# Patient Record
Sex: Female | Born: 1945 | Race: White | Hispanic: No | Marital: Married | Smoking: Never smoker
Health system: Southern US, Community
[De-identification: ages and names within clinical notes are randomized; demographics above are authoritative.]

## PROBLEM LIST (undated history)

## (undated) DIAGNOSIS — G629 Polyneuropathy, unspecified: Secondary | ICD-10-CM

## (undated) DIAGNOSIS — I10 Essential (primary) hypertension: Secondary | ICD-10-CM

## (undated) DIAGNOSIS — G5600 Carpal tunnel syndrome, unspecified upper limb: Secondary | ICD-10-CM

## (undated) DIAGNOSIS — G709 Myoneural disorder, unspecified: Secondary | ICD-10-CM

## (undated) DIAGNOSIS — Z8679 Personal history of other diseases of the circulatory system: Secondary | ICD-10-CM

## (undated) DIAGNOSIS — D509 Iron deficiency anemia, unspecified: Secondary | ICD-10-CM

## (undated) DIAGNOSIS — Z8719 Personal history of other diseases of the digestive system: Secondary | ICD-10-CM

## (undated) DIAGNOSIS — E119 Type 2 diabetes mellitus without complications: Secondary | ICD-10-CM

## (undated) DIAGNOSIS — I2699 Other pulmonary embolism without acute cor pulmonale: Secondary | ICD-10-CM

## (undated) DIAGNOSIS — F32A Depression, unspecified: Secondary | ICD-10-CM

## (undated) DIAGNOSIS — R011 Cardiac murmur, unspecified: Secondary | ICD-10-CM

## (undated) DIAGNOSIS — G839 Paralytic syndrome, unspecified: Secondary | ICD-10-CM

## (undated) DIAGNOSIS — K219 Gastro-esophageal reflux disease without esophagitis: Secondary | ICD-10-CM

## (undated) DIAGNOSIS — R112 Nausea with vomiting, unspecified: Secondary | ICD-10-CM

## (undated) DIAGNOSIS — I499 Cardiac arrhythmia, unspecified: Secondary | ICD-10-CM

## (undated) DIAGNOSIS — K259 Gastric ulcer, unspecified as acute or chronic, without hemorrhage or perforation: Secondary | ICD-10-CM

## (undated) DIAGNOSIS — F419 Anxiety disorder, unspecified: Secondary | ICD-10-CM

## (undated) DIAGNOSIS — M1990 Unspecified osteoarthritis, unspecified site: Secondary | ICD-10-CM

## (undated) DIAGNOSIS — Z9889 Other specified postprocedural states: Secondary | ICD-10-CM

## (undated) DIAGNOSIS — Z87442 Personal history of urinary calculi: Secondary | ICD-10-CM

## (undated) DIAGNOSIS — R0600 Dyspnea, unspecified: Secondary | ICD-10-CM

## (undated) HISTORY — PX: UPPER GASTROINTESTINAL ENDOSCOPY: SHX188

## (undated) HISTORY — PX: APPENDECTOMY: SHX54

## (undated) HISTORY — PX: COLONOSCOPY: SHX174

## (undated) HISTORY — PX: OTHER SURGICAL HISTORY: SHX169

## (undated) HISTORY — DX: Polyneuropathy, unspecified: G62.9

## (undated) HISTORY — PX: TONSILLECTOMY: SUR1361

## (undated) HISTORY — DX: Other pulmonary embolism without acute cor pulmonale: I26.99

## (undated) HISTORY — PX: KNEE ARTHROSCOPY: SUR90

## (undated) HISTORY — DX: Essential (primary) hypertension: I10

## (undated) HISTORY — PX: EYE SURGERY: SHX253

## (undated) HISTORY — PX: CHOLECYSTECTOMY: SHX55

## (undated) HISTORY — PX: NECK SURGERY: SHX720

---

## 2001-08-02 ENCOUNTER — Inpatient Hospital Stay (HOSPITAL_COMMUNITY): Admission: EM | Admit: 2001-08-02 | Discharge: 2001-08-04 | Payer: Self-pay | Admitting: Emergency Medicine

## 2001-08-02 ENCOUNTER — Encounter (INDEPENDENT_AMBULATORY_CARE_PROVIDER_SITE_OTHER): Payer: Self-pay | Admitting: Specialist

## 2001-08-02 ENCOUNTER — Encounter: Payer: Self-pay | Admitting: Family Medicine

## 2001-09-19 ENCOUNTER — Encounter: Payer: Self-pay | Admitting: Family Medicine

## 2001-09-19 ENCOUNTER — Ambulatory Visit (HOSPITAL_COMMUNITY): Admission: RE | Admit: 2001-09-19 | Discharge: 2001-09-19 | Payer: Self-pay | Admitting: Family Medicine

## 2001-10-02 ENCOUNTER — Ambulatory Visit (HOSPITAL_COMMUNITY): Admission: RE | Admit: 2001-10-02 | Discharge: 2001-10-02 | Payer: Self-pay | Admitting: Family Medicine

## 2001-10-02 ENCOUNTER — Encounter: Payer: Self-pay | Admitting: Family Medicine

## 2002-02-13 ENCOUNTER — Ambulatory Visit (HOSPITAL_COMMUNITY): Admission: RE | Admit: 2002-02-13 | Discharge: 2002-02-13 | Payer: Self-pay | Admitting: Internal Medicine

## 2002-03-19 ENCOUNTER — Encounter: Payer: Self-pay | Admitting: Obstetrics and Gynecology

## 2002-03-19 ENCOUNTER — Ambulatory Visit (HOSPITAL_COMMUNITY): Admission: RE | Admit: 2002-03-19 | Discharge: 2002-03-19 | Payer: Self-pay | Admitting: Obstetrics and Gynecology

## 2002-05-10 ENCOUNTER — Ambulatory Visit (HOSPITAL_COMMUNITY): Admission: RE | Admit: 2002-05-10 | Discharge: 2002-05-10 | Payer: Self-pay | Admitting: Family Medicine

## 2002-05-10 ENCOUNTER — Encounter: Payer: Self-pay | Admitting: Family Medicine

## 2002-07-19 ENCOUNTER — Ambulatory Visit (HOSPITAL_COMMUNITY): Admission: RE | Admit: 2002-07-19 | Discharge: 2002-07-19 | Payer: Self-pay | Admitting: Family Medicine

## 2002-07-19 ENCOUNTER — Encounter: Payer: Self-pay | Admitting: Family Medicine

## 2002-07-30 ENCOUNTER — Ambulatory Visit (HOSPITAL_COMMUNITY): Admission: RE | Admit: 2002-07-30 | Discharge: 2002-07-30 | Payer: Self-pay | Admitting: Internal Medicine

## 2002-08-12 ENCOUNTER — Ambulatory Visit (HOSPITAL_COMMUNITY): Admission: RE | Admit: 2002-08-12 | Discharge: 2002-08-12 | Payer: Self-pay | Admitting: Family Medicine

## 2002-08-12 ENCOUNTER — Encounter: Payer: Self-pay | Admitting: Family Medicine

## 2002-11-25 ENCOUNTER — Ambulatory Visit (HOSPITAL_COMMUNITY): Admission: RE | Admit: 2002-11-25 | Discharge: 2002-11-25 | Payer: Self-pay | Admitting: Gastroenterology

## 2002-11-25 ENCOUNTER — Encounter: Payer: Self-pay | Admitting: Gastroenterology

## 2002-12-25 ENCOUNTER — Encounter: Admission: RE | Admit: 2002-12-25 | Discharge: 2002-12-25 | Payer: Self-pay | Admitting: Gastroenterology

## 2002-12-25 ENCOUNTER — Encounter: Payer: Self-pay | Admitting: Gastroenterology

## 2003-04-10 ENCOUNTER — Encounter: Payer: Self-pay | Admitting: Obstetrics and Gynecology

## 2003-04-10 ENCOUNTER — Ambulatory Visit (HOSPITAL_COMMUNITY): Admission: RE | Admit: 2003-04-10 | Discharge: 2003-04-10 | Payer: Self-pay | Admitting: Obstetrics and Gynecology

## 2003-05-23 ENCOUNTER — Ambulatory Visit (HOSPITAL_COMMUNITY): Admission: RE | Admit: 2003-05-23 | Discharge: 2003-05-23 | Payer: Self-pay | Admitting: Family Medicine

## 2003-05-23 ENCOUNTER — Encounter: Payer: Self-pay | Admitting: Family Medicine

## 2003-05-29 ENCOUNTER — Encounter: Payer: Self-pay | Admitting: Family Medicine

## 2003-05-29 ENCOUNTER — Ambulatory Visit (HOSPITAL_COMMUNITY): Admission: RE | Admit: 2003-05-29 | Discharge: 2003-05-29 | Payer: Self-pay | Admitting: Family Medicine

## 2003-10-24 ENCOUNTER — Ambulatory Visit (HOSPITAL_COMMUNITY): Admission: RE | Admit: 2003-10-24 | Discharge: 2003-10-24 | Payer: Self-pay | Admitting: Family Medicine

## 2003-11-07 ENCOUNTER — Ambulatory Visit (HOSPITAL_COMMUNITY): Admission: RE | Admit: 2003-11-07 | Discharge: 2003-11-07 | Payer: Self-pay | Admitting: Otolaryngology

## 2004-03-19 ENCOUNTER — Ambulatory Visit (HOSPITAL_COMMUNITY): Admission: RE | Admit: 2004-03-19 | Discharge: 2004-03-19 | Payer: Self-pay | Admitting: Family Medicine

## 2004-06-16 ENCOUNTER — Inpatient Hospital Stay (HOSPITAL_COMMUNITY): Admission: RE | Admit: 2004-06-16 | Discharge: 2004-06-17 | Payer: Self-pay | Admitting: Neurosurgery

## 2004-08-12 ENCOUNTER — Ambulatory Visit (HOSPITAL_COMMUNITY): Admission: RE | Admit: 2004-08-12 | Discharge: 2004-08-12 | Payer: Self-pay | Admitting: Family Medicine

## 2005-01-13 ENCOUNTER — Ambulatory Visit (HOSPITAL_COMMUNITY): Admission: RE | Admit: 2005-01-13 | Discharge: 2005-01-13 | Payer: Self-pay | Admitting: Family Medicine

## 2005-01-14 ENCOUNTER — Ambulatory Visit (HOSPITAL_COMMUNITY): Admission: RE | Admit: 2005-01-14 | Discharge: 2005-01-14 | Payer: Self-pay | Admitting: Family Medicine

## 2005-10-13 ENCOUNTER — Ambulatory Visit (HOSPITAL_COMMUNITY): Admission: RE | Admit: 2005-10-13 | Discharge: 2005-10-13 | Payer: Self-pay | Admitting: Family Medicine

## 2005-10-19 ENCOUNTER — Ambulatory Visit: Payer: Self-pay | Admitting: Internal Medicine

## 2005-10-25 ENCOUNTER — Ambulatory Visit: Payer: Self-pay | Admitting: Internal Medicine

## 2005-10-25 ENCOUNTER — Ambulatory Visit (HOSPITAL_COMMUNITY): Admission: RE | Admit: 2005-10-25 | Discharge: 2005-10-25 | Payer: Self-pay | Admitting: Internal Medicine

## 2005-12-28 ENCOUNTER — Ambulatory Visit: Payer: Self-pay | Admitting: Internal Medicine

## 2006-03-10 ENCOUNTER — Ambulatory Visit (HOSPITAL_COMMUNITY): Admission: RE | Admit: 2006-03-10 | Discharge: 2006-03-10 | Payer: Self-pay | Admitting: Family Medicine

## 2006-03-13 ENCOUNTER — Ambulatory Visit: Payer: Self-pay | Admitting: Internal Medicine

## 2006-07-21 ENCOUNTER — Ambulatory Visit (HOSPITAL_COMMUNITY): Admission: RE | Admit: 2006-07-21 | Discharge: 2006-07-21 | Payer: Self-pay | Admitting: Obstetrics and Gynecology

## 2007-06-01 ENCOUNTER — Ambulatory Visit: Payer: Self-pay | Admitting: Internal Medicine

## 2007-06-22 ENCOUNTER — Ambulatory Visit (HOSPITAL_COMMUNITY): Admission: RE | Admit: 2007-06-22 | Discharge: 2007-06-22 | Payer: Self-pay | Admitting: Obstetrics and Gynecology

## 2007-07-03 ENCOUNTER — Emergency Department (HOSPITAL_COMMUNITY): Admission: EM | Admit: 2007-07-03 | Discharge: 2007-07-03 | Payer: Self-pay | Admitting: Emergency Medicine

## 2007-08-02 ENCOUNTER — Observation Stay (HOSPITAL_COMMUNITY): Admission: RE | Admit: 2007-08-02 | Discharge: 2007-08-03 | Payer: Self-pay | Admitting: Obstetrics and Gynecology

## 2007-10-11 ENCOUNTER — Ambulatory Visit (HOSPITAL_COMMUNITY): Admission: RE | Admit: 2007-10-11 | Discharge: 2007-10-11 | Payer: Self-pay | Admitting: Obstetrics and Gynecology

## 2007-12-10 ENCOUNTER — Ambulatory Visit (HOSPITAL_COMMUNITY): Admission: RE | Admit: 2007-12-10 | Discharge: 2007-12-10 | Payer: Self-pay | Admitting: Family Medicine

## 2007-12-17 ENCOUNTER — Ambulatory Visit (HOSPITAL_COMMUNITY): Admission: RE | Admit: 2007-12-17 | Discharge: 2007-12-17 | Payer: Self-pay | Admitting: Family Medicine

## 2008-03-04 ENCOUNTER — Ambulatory Visit (HOSPITAL_COMMUNITY): Admission: RE | Admit: 2008-03-04 | Discharge: 2008-03-04 | Payer: Self-pay | Admitting: Family Medicine

## 2008-03-19 ENCOUNTER — Encounter: Admission: RE | Admit: 2008-03-19 | Discharge: 2008-03-19 | Payer: Self-pay | Admitting: Sports Medicine

## 2008-08-18 ENCOUNTER — Ambulatory Visit (HOSPITAL_COMMUNITY): Admission: RE | Admit: 2008-08-18 | Discharge: 2008-08-18 | Payer: Self-pay | Admitting: Family Medicine

## 2008-09-03 ENCOUNTER — Ambulatory Visit (HOSPITAL_COMMUNITY): Admission: RE | Admit: 2008-09-03 | Discharge: 2008-09-05 | Payer: Self-pay | Admitting: Neurosurgery

## 2008-09-08 ENCOUNTER — Ambulatory Visit (HOSPITAL_COMMUNITY): Admission: RE | Admit: 2008-09-08 | Discharge: 2008-09-08 | Payer: Self-pay | Admitting: Family Medicine

## 2008-09-09 ENCOUNTER — Ambulatory Visit (HOSPITAL_COMMUNITY): Admission: RE | Admit: 2008-09-09 | Discharge: 2008-09-09 | Payer: Self-pay | Admitting: Family Medicine

## 2008-09-24 ENCOUNTER — Emergency Department (HOSPITAL_COMMUNITY): Admission: EM | Admit: 2008-09-24 | Discharge: 2008-09-24 | Payer: Self-pay | Admitting: Emergency Medicine

## 2008-10-20 ENCOUNTER — Other Ambulatory Visit: Admission: RE | Admit: 2008-10-20 | Discharge: 2008-10-20 | Payer: Self-pay | Admitting: Obstetrics and Gynecology

## 2008-10-29 ENCOUNTER — Ambulatory Visit (HOSPITAL_COMMUNITY): Admission: RE | Admit: 2008-10-29 | Discharge: 2008-10-29 | Payer: Self-pay | Admitting: Family Medicine

## 2008-11-17 ENCOUNTER — Ambulatory Visit: Payer: Self-pay | Admitting: Cardiology

## 2009-01-21 ENCOUNTER — Ambulatory Visit (HOSPITAL_COMMUNITY): Admission: RE | Admit: 2009-01-21 | Discharge: 2009-01-21 | Payer: Self-pay | Admitting: Internal Medicine

## 2009-01-26 ENCOUNTER — Ambulatory Visit (HOSPITAL_COMMUNITY): Admission: RE | Admit: 2009-01-26 | Discharge: 2009-01-26 | Payer: Self-pay | Admitting: Family Medicine

## 2009-03-10 ENCOUNTER — Encounter (INDEPENDENT_AMBULATORY_CARE_PROVIDER_SITE_OTHER): Payer: Self-pay

## 2009-03-10 LAB — CONVERTED CEMR LAB
HCT: 41.1 %
MCV: 93 fL
Platelets: 276 10*3/uL
Potassium: 4.1 meq/L
Sodium: 142 meq/L

## 2009-03-13 ENCOUNTER — Ambulatory Visit (HOSPITAL_COMMUNITY): Admission: RE | Admit: 2009-03-13 | Discharge: 2009-03-13 | Payer: Self-pay | Admitting: Family Medicine

## 2009-05-22 ENCOUNTER — Ambulatory Visit (HOSPITAL_COMMUNITY): Admission: RE | Admit: 2009-05-22 | Discharge: 2009-05-22 | Payer: Self-pay | Admitting: Family Medicine

## 2009-11-17 ENCOUNTER — Ambulatory Visit (HOSPITAL_COMMUNITY): Admission: RE | Admit: 2009-11-17 | Discharge: 2009-11-17 | Payer: Self-pay | Admitting: Ophthalmology

## 2009-12-29 DIAGNOSIS — I10 Essential (primary) hypertension: Secondary | ICD-10-CM | POA: Insufficient documentation

## 2009-12-29 DIAGNOSIS — R011 Cardiac murmur, unspecified: Secondary | ICD-10-CM | POA: Insufficient documentation

## 2009-12-29 DIAGNOSIS — K449 Diaphragmatic hernia without obstruction or gangrene: Secondary | ICD-10-CM | POA: Insufficient documentation

## 2009-12-29 DIAGNOSIS — K219 Gastro-esophageal reflux disease without esophagitis: Secondary | ICD-10-CM

## 2009-12-29 DIAGNOSIS — J45909 Unspecified asthma, uncomplicated: Secondary | ICD-10-CM | POA: Insufficient documentation

## 2009-12-30 ENCOUNTER — Ambulatory Visit: Payer: Self-pay | Admitting: Cardiology

## 2009-12-30 ENCOUNTER — Encounter (INDEPENDENT_AMBULATORY_CARE_PROVIDER_SITE_OTHER): Payer: Self-pay

## 2010-03-22 ENCOUNTER — Ambulatory Visit (HOSPITAL_COMMUNITY): Admission: RE | Admit: 2010-03-22 | Discharge: 2010-03-22 | Payer: Self-pay | Admitting: Obstetrics and Gynecology

## 2010-08-20 ENCOUNTER — Emergency Department (HOSPITAL_COMMUNITY): Admission: EM | Admit: 2010-08-20 | Discharge: 2010-08-20 | Payer: Self-pay | Admitting: Emergency Medicine

## 2010-10-08 ENCOUNTER — Ambulatory Visit (HOSPITAL_COMMUNITY): Admission: RE | Admit: 2010-10-08 | Discharge: 2010-10-08 | Payer: Self-pay | Admitting: Family Medicine

## 2010-11-24 ENCOUNTER — Other Ambulatory Visit
Admission: RE | Admit: 2010-11-24 | Discharge: 2010-11-24 | Payer: Self-pay | Source: Home / Self Care | Admitting: Obstetrics and Gynecology

## 2010-12-21 ENCOUNTER — Encounter: Payer: Self-pay | Admitting: Cardiology

## 2010-12-21 ENCOUNTER — Ambulatory Visit: Payer: Self-pay | Admitting: Cardiology

## 2010-12-21 DIAGNOSIS — E663 Overweight: Secondary | ICD-10-CM | POA: Insufficient documentation

## 2010-12-23 ENCOUNTER — Ambulatory Visit (HOSPITAL_COMMUNITY)
Admission: RE | Admit: 2010-12-23 | Discharge: 2010-12-23 | Payer: Self-pay | Source: Home / Self Care | Attending: Cardiology | Admitting: Cardiology

## 2010-12-23 ENCOUNTER — Encounter: Payer: Self-pay | Admitting: Cardiology

## 2011-01-27 NOTE — Miscellaneous (Signed)
**Note De-Identified [facility] Obfuscation** Summary: CBC and  BMP  Clinical Lists Changes  Observations: Added new observation of CALCIUM: 9.2 mg/dL (14/78/2956 21:30) Added new observation of CREATININE: 1.03 mg/dL (86/57/8469 62:95) Added new observation of BUN: 16 mg/dL (28/41/3244 01:02) Added new observation of BG RANDOM: 178 mg/dL (72/53/6644 03:47) Added new observation of CO2 PLSM/SER: 24 meq/L (03/10/2009 10:11) Added new observation of CL SERUM: 106 meq/L (03/10/2009 10:11) Added new observation of K SERUM: 4.1 meq/L (03/10/2009 10:11) Added new observation of NA: 142 meq/L (03/10/2009 10:11) Added new observation of PLATELETK/UL: 276 K/uL (03/10/2009 10:11) Added new observation of MCV: 93.0 fL (03/10/2009 10:11) Added new observation of HCT: 41.1 % (03/10/2009 10:11) Added new observation of HGB: 13.6 g/dL (42/59/5638 75:64) Added new observation of WBC COUNT: 7.4 10*3/microliter (03/10/2009 10:11)

## 2011-01-27 NOTE — Assessment & Plan Note (Signed)
Summary: F1Y   Visit Type:  Follow-up Primary [name]:  Mindi Slicker at [facility]  CC:  no cardiology complaints 1 yr fu.  History of Present Illness: [name] returns today for followup of her hypertension, obesity, and family history of coronary disease.  She is having no angina or ischemic symptoms. She is fairly inactive because of problems with her left knee and right foot. Her weight is increased from last year from 192-199. Her BMI is now 35.3.  Her blood pressures done a good control. She is blessed with low-cholesterol and an HDL and high 50s.  She was told by her primary care that she had an irregular heartbeat. This was by exam and not by EKG. She denies the palpitations or chest discomfort. She's had no problems with presyncope or syncope.  Current Medications (verified): 1)  Valium 5 Mg Tabs (Diazepam) .... Take One Half Tablet Daily 2)  Tylenol 325 Mg Tabs (Acetaminophen) .... Take As Needed For Pain 3)  Hydrochlorothiazide 25 Mg Tabs (Hydrochlorothiazide) .... Take 1/2  Tab Daily 4)  Singulair 10 Mg Tabs (Montelukast Sodium) .... Take 1 Tab Daily 5)  Oyster Shell Calcium 500 + D 500-125 Mg-Unit Tabs (Calcium Carbonate-Vitamin D) .... Take 2 Tablets By Mouth Daily 6)  Buspar 5 Mg Tabs (Buspirone Hcl) .... Take 2 Tablets By Mouth Daily 7)  Prozac 20 Mg Caps (Fluoxetine Hcl) .... Take 1 Tablet By Mouth Once A Day 8)  Inderal La 80 Mg Xr24h-Cap (Propranolol Hcl) .... Take 1 Tablet By Mouth Once A Day 9)  Femhrt 1/5 1-5 Mg-Mcg Tabs (Norethindrone-Eth Estradiol) .... Take 1 Tablet By Mouth Once A Day 10)  Zegerid Otc 20-1100 Mg Caps (Omeprazole-Sodium Bicarbonate) .... Take 1 Tab Daily 11)  Gabapentin 300 Mg Caps (Gabapentin) .... Take 1 Tab Daily 12)  Bupropion Hcl 150 Mg Xr24h-Tab (Bupropion Hcl) .... Take 1 Tab Daily  Allergies (verified): 1)  ! * Pencillin  Comments:  Nurse/Medical Assistant: patient brought med list her [facility] is [facility] she also stopped  prilosec and started zegerid otc  Past History:  Past Medical History: Last updated: Jan 08, 2010 Current Problems:  HEART MURMUR, SYSTOLIC (ICD-785.2) ASTHMA, MILD (ICD-493.90) HYPERTENSION (ICD-401.9) HIATAL HERNIA (ICD-553.3) GERD (ICD-530.81)  Past Surgical History: Last updated: January 08, 2010 tonsillectomy cholecystectomy appendectomy knee surgery cervical surgery  Family History: Last updated: 01/08/2010 Father:deceased due to renal failure Mother:alive peptic ulcer disease  Social History: Last updated: 2010/01/08 Retired  Married  Tobacco Use - No.  Alcohol Use - no Regular Exercise - no Drug Use - no  Risk Factors: Exercise: no (January 08, 2010)  Risk Factors: Smoking Status: never (Jan 08, 2010)  Review of Systems       negative other than history of present illness  Vital Signs:  Patient profile:   65 year old female Weight:      199 pounds BMI:     35.38 O2 Sat:      98 % on Room air Pulse rate:   76 / minute BP sitting:   122 / 69  (right arm)  Vitals Entered By: Hailey Saa, CNA (December 21, 2010 10:41 AM)  O2 Flow:  Room air  Physical Exam  General:  obese.  no acute distress Head:  normocephalic and atraumatic Eyes:  PERRLA/EOM intact; conjunctiva and lids normal. Neck:  Neck supple, no JVD. No masses, thyromegaly or abnormal cervical nodes. Chest Wall:  no deformities or breast masses noted Lungs:  Clear bilaterally to auscultation and percussion. Heart:  PMI nondisplaced, regular rate  and rhythm, soft systolic murmur with S2 splitting. No carotid bruits Abdomen:  soft, good bowel sounds, no midline bruit Msk:  decreased ROM.   Pulses:  pulses normal in all 4 extremities Extremities:  No clubbing or cyanosis. Neurologic:  Alert and oriented x 3. Skin:  Intact without lesions or rashes. Psych:  Normal affect.   Problems:  Medical Problems Added: 1)  Dx of Overweight/obesity  (ICD-278.02)  EKG  Procedure date:   12/21/2010  Findings:      normal sinus rhythm, low voltage, normal EKG  Impression & Recommendations:  Problem # 1:  HEART MURMUR, SYSTOLIC (ICD-785.2) This is really unchanged since 2009. However, with being told she had some irregular heartbeats, which could just been PACs, we'll obtain a 2-D echocardiogram to rule out any structural heart disease. We can also see if her blood pressures been under good control. Her updated medication list for this problem includes:    Hydrochlorothiazide 25 Mg Tabs (Hydrochlorothiazide) .Marland Kitchen... Take 1/2  tab daily    Inderal La 80 Mg Xr24h-cap (Propranolol hcl) .Marland Kitchen... Take 1 tablet by mouth once a day  Orders: 2-D Echocardiogram (2D Echo)  Problem # 2:  HYPERTENSION (ICD-401.9) Assessment: Unchanged  Her updated medication list for this problem includes:    Hydrochlorothiazide 25 Mg Tabs (Hydrochlorothiazide) .Marland Kitchen... Take 1/2  tab daily    Inderal La 80 Mg Xr24h-cap (Propranolol hcl) .Marland Kitchen... Take 1 tablet by mouth once a day  Problem # 3:  OVERWEIGHT/OBESITY (ICD-278.02) I had a long talk with her about avoiding further additional weight gain. His medications including the development of diabetes and other comorbidities were stressed. Of course encouraged her to lose weight.  Patient Instructions: 1)  Your physician recommends that you schedule a follow-up appointment in: 1 year 2)  Your physician recommends that you continue on your current medications as directed. Please refer to the Current Medication list given to you today. 3)  Your physician has requested that you have an echocardiogram.  Echocardiography is a painless test that uses sound waves to create images of your heart. It provides your doctor with information about the size and shape of your heart and how well your heart's chambers and valves are working.  This procedure takes approximately one hour. There are no restrictions for this procedure.

## 2011-01-27 NOTE — Assessment & Plan Note (Signed)
Summary: 65 YR F/U PER CKOUT 11/17/08-DSF   Visit Type:  Follow-up 1 year Primary Provider:  Dr. Patrica Duel   History of Present Illness: [name] returns today for further evaluation and management of possible coronary artery disease with her cardiovascular risk factors.  She denies any exertional chest discomfort or symptoms of angina or ischemia. She does have a sliding hiatal hernia and suffers from significant reflux. She elevates the head of her bed with 2 bricks and takes maintenance medication.  She denies orthopnea, PND or peripheral edema. She's had no palpitations or syncope.  She's blessed with outstanding lipids as reviewed last year with her. Her hemoglobin A1c is still normal. She has not lost any weight. She does not smoke. Her blood pressures but under good control with medication.  Current Medications (verified): 1)  Valium 5 Mg Tabs (Diazepam) .... Take One Half Tablet Daily 2)  Nexium 40 Mg Cpdr (Esomeprazole Magnesium) .... Take 1 Tab Daily 3)  Tylenol 325 Mg Tabs (Acetaminophen) .... Take As Needed For Pain 4)  Hydrochlorothiazide 25 Mg Tabs (Hydrochlorothiazide) .... Take 1/2  Tab Daily 5)  Singulair 10 Mg Tabs (Montelukast Sodium) .... Take 1 Tab Daily 6)  Prilosec 20 Mg Cpdr (Omeprazole) .... Take 1 Tab Daily 7)  Oyster Shell Calcium 500 + D 500-125 Mg-Unit Tabs (Calcium Carbonate-Vitamin D) .... Take 2 Tablets By Mouth Daily 8)  Buspar 5 Mg Tabs (Buspirone Hcl) .... Take 2 Tablets By Mouth Daily 9)  Prozac 20 Mg Caps (Fluoxetine Hcl) .... Take 1 Tablet By Mouth Once A Day 10)  Inderal La 80 Mg Xr24h-Cap (Propranolol Hcl) .... Take 1 Tablet By Mouth Once A Day 11)  Femhrt 1/5 1-5 Mg-Mcg Tabs (Norethindrone-Eth Estradiol) .... Take 1 Tablet By Mouth Once A Day  Allergies (verified): 1)  ! * Pencillin  Past History:  Past Medical History: Last updated: January 11, 2010 Current Problems:  HEART MURMUR, SYSTOLIC (ICD-785.2) ASTHMA, MILD  (ICD-493.90) HYPERTENSION (ICD-401.9) HIATAL HERNIA (ICD-553.3) GERD (ICD-530.81)  Past Surgical History: Last updated: 01-11-10 tonsillectomy cholecystectomy appendectomy knee surgery cervical surgery  Family History: Last updated: 2010/01/11 Father:deceased due to renal failure Mother:alive peptic ulcer disease  Social History: Last updated: 11-Jan-2010 Retired  Married  Tobacco Use - No.  Alcohol Use - no Regular Exercise - no Drug Use - no  Risk Factors: Exercise: no (01-11-10)  Risk Factors: Smoking Status: never (11-Jan-2010)  Review of Systems       negative other than history of present illness  Vital Signs:  Patient profile:   65 year old female Height:      63 inches Weight:      192 pounds BMI:     34.13 O2 Sat:      98 % on Room air Pulse rate:   65 / minute BP sitting:   128 / 66  (left arm)  Vitals Entered By: Teressa Lower RN (January 65, 2011 3:07 PM)  O2 Flow:  Room air  Physical Exam  General:  obese.   Head:  normocephalic and atraumatic Eyes:  PERRLA/EOM intact; conjunctiva and lids normal. Mouth:  Teeth, gums and palate normal. Oral mucosa normal. Neck:  Neck supple, no JVD. No masses, thyromegaly or abnormal cervical nodes. Lungs:  Clear bilaterally to auscultation and percussion. Heart:  Non-displaced PMI, chest non-tender; regular rate and rhythm, S1, S2 with soft systolic murmur at the left sternal border rubs or gallops. Carotid upstroke normal, no bruit. Normal abdominal aortic size, no bruits. Femorals normal  pulses, no bruits. Pedals normal pulses. No edema, no varicosities. Abdomen:  Bowel sounds positive; abdomen soft and non-tender without masses, organomegaly, or hernias noted. No hepatosplenomegaly. Msk:  decreased ROM.   Pulses:  pulses normal in all 4 extremities Extremities:  No clubbing or cyanosis. Neurologic:  Alert and oriented x 3. Skin:  Intact without lesions or rashes. Psych:  Normal  affect.   Impression & Recommendations:  Problem # 1:  HEART MURMUR, SYSTOLIC (ICD-785.2) Assessment Unchanged  The following medications were removed from the medication list:    Atenolol 50 Mg Tabs (Atenolol) .Marland Kitchen... Take 1 tab daily Her updated medication list for this problem includes:    Hydrochlorothiazide 25 Mg Tabs (Hydrochlorothiazide) .Marland Kitchen... Take 1/2  tab daily    Inderal La 80 Mg Xr24h-cap (Propranolol hcl) .Marland Kitchen... Take 1 tablet by mouth once a day  Problem # 2:  HYPERTENSION (ICD-401.9) Assessment: Improved  The following medications were removed from the medication list:    Atenolol 50 Mg Tabs (Atenolol) .Marland Kitchen... Take 1 tab daily Her updated medication list for this problem includes:    Hydrochlorothiazide 25 Mg Tabs (Hydrochlorothiazide) .Marland Kitchen... Take 1/2  tab daily    Inderal La 80 Mg Xr24h-cap (Propranolol hcl) .Marland Kitchen... Take 1 tablet by mouth once a day  Problem # 3:  GERD (ICD-530.81) Assessment: Unchanged Her chest pain is from her reflux. She is very clear on this. We have talked about symptoms of angina or acute coronary syndrome and had responded. His lungs her blood pressures kept under good control and she remains a nondiabetic, think her risk is fairly low. Her updated medication list for this problem includes:    Nexium 40 Mg Cpdr (Esomeprazole magnesium) .Marland Kitchen... Take 1 tab daily    Prilosec 20 Mg Cpdr (Omeprazole) .Marland Kitchen... Take 1 tab daily  Patient Instructions: 1)  Your physician recommends that you schedule a follow-up appointment in: 1 year

## 2011-03-10 LAB — POCT I-STAT, CHEM 8
Creatinine, Ser: 1.1 mg/dL (ref 0.4–1.2)
HCT: 44 % (ref 36.0–46.0)
Hemoglobin: 15 g/dL (ref 12.0–15.0)
Potassium: 3.9 mEq/L (ref 3.5–5.1)
Sodium: 141 mEq/L (ref 135–145)

## 2011-03-10 LAB — DIFFERENTIAL
Eosinophils Absolute: 0.5 10*3/uL (ref 0.0–0.7)
Lymphocytes Relative: 14 % (ref 12–46)
Lymphs Abs: 2.1 10*3/uL (ref 0.7–4.0)
Neutro Abs: 11.4 10*3/uL — ABNORMAL HIGH (ref 1.7–7.7)
Neutrophils Relative %: 76 % (ref 43–77)

## 2011-03-10 LAB — CBC
MCH: 30.7 pg (ref 26.0–34.0)
Platelets: 294 10*3/uL (ref 150–400)
RBC: 4.56 MIL/uL (ref 3.87–5.11)
WBC: 15 10*3/uL — ABNORMAL HIGH (ref 4.0–10.5)

## 2011-03-10 LAB — GLUCOSE, CAPILLARY: Glucose-Capillary: 139 mg/dL — ABNORMAL HIGH (ref 70–99)

## 2011-05-10 NOTE — Assessment & Plan Note (Signed)
NAMEMarland Kitchen  ARSHIA, SPELLMAN                 CHART#:  [id number]   DATE:  06/01/2007                       DOB:  October 24, 1946   CHIEF COMPLAINT:  Follow up chronic GERD/history of elevated  transaminases/IBS.   SUBJECTIVE:  [name] is a 65 year old Caucasian female who has  history of IBS.  She is using daily fiber supplementation, and is doing  quite well.  She said her urgency is nowhere near as bad as it had been  previously.  She has GERD, which is well controlled on Nexium 40 mg  daily at this point . Her weight is down 10 pounds intentionally.  She  was told she was borderline diabetic, and had a hemoglobin A1c of 6.2,  so she has been actively trying to lose weight.  She denies any  anorexia.  She was found to have elevated transaminases last year.  She  had an abdominal ultrasound on March 10, 2006, which showed findings  compatible with diffuse hematochyluria disease, status post  cholecystectomy.  Her last LFTs were from November 08, 2006, and were  normal.  Plan is to repeat them today.   CURRENT MEDICATIONS:  See other list from June 01, 2007.   ALLERGIES:  PENICILLIN.   OBJECTIVE:  VITAL SIGNS:  Weight 185 pounds.  Height 64 inches.  Temperature 98.3.  Blood pressure 140/80.  Pulse 64.  GENERAL:  [name] is alert, oriented, pleasant, cooperative, well-  developed, well-nourished Caucasian female in no acute distress.  HEENT:  Conjunctivae pink. sclera non-icteric. Oropharynx pink and moist  without any lesions.  CHEST:  Heart regular rate and rhythm.  Normal S1 and S2.  ABDOMEN:  Protuberant with positive bowel sounds x4.  No bruits  auscultated.  Soft, non-tender, and non-distended.  No palpable  hepatosplenomegaly.  No rebound, tenderness or guarding.  EXTREMITIES:  Without clubbing or edema bilaterally.  SKIN:  Pink, warm, and dry without any rash or jaundice.   ASSESSMENT:  1. [name] is a 65 year old Caucasian female with irritable bowel      syndrome, well  controlled on fiber.  2. Gastroesophageal reflux disease well controlled on Nexium 40 mg      daily.  3. Transaminase last year, which resolved in November 2007.  She is      gradually losing weight.  I suspect her transaminase may have been      related to fatty liver.   PLAN:  1. Recheck LFTs.  2. Nexium 40 mg daily, number 31 with 11 refills, and a rebate card      were given.  3. Office visit with Dr. Karilyn Cota in 1 year, or sooner if needed.       Lorenza Burton, N.P.  Electronically Signed     Lionel December, M.D.  Electronically Signed    KJ/MEDQ  D:  06/01/2007  T:  06/01/2007  Job:  [id number]   cc:   Kirk Ruths, M.D.

## 2011-05-10 NOTE — Assessment & Plan Note (Signed)
[facility]                       [facility] OFFICE NOTE   [name]                       MRN:          [id number]  DATE:11/17/2008                            DOB:          04-15-1946    CHIEF COMPLAINT:  I just turned 65, want to get checked out.   HISTORY OF PRESENT ILLNESS:  [name] is a delightful 65 year old  married white female, wife of [name], patient of mine, who comes  today because of cardiovascular risk factors and just to general  cardiovascular checkup.   She is followed very closely by Dr. Patrica Duel.  She is seen by [place] in  the past for some atypical chest pain.  Her last stress Myoview was May 05, 2004, which showed EF of 70% with no ischemia or infarction.   Her cardiac risk factors include hypertension which is under good  control.  She is overweight and she has borderline glucose intolerance  with her recent hemoglobin A1c of 6% and a fasting blood sugar of 114.   She does not smoke, does not have hyperlipidemia.  In fact, her lipid  panels remarkably good, on no meds.  Total cholesterol 130,  triglycerides of 80, HDL 58, LDL 56.  Total cholesterol with HDL ratio  of 2.2.   She denies any exertional chest discomfort.  She does have some mild  dyspnea on exertion.  She is really limited in her exercise capacity  with her left knee.   PAST MEDICAL HISTORY:  She is intolerant to PENICILLIN, KEFLEX,  PERCOCET, DARVOCET, and ASPIRIN.   CURRENT MEDICATIONS:  1. Femhrt tabs 1 daily.  2. Nexium 40 mg a day.  3. Folic acid 2 mg a day.  4. Buspirone 10 mg a day.  5. Fluoxetine 20 mg a day.  6. Metoprolol extended release 50 mg a day.  7. Hydrochlorothiazide 12.5 mg a day.  8. Caltrate 600.  9. Vitamin D daily.   PAST SURGICAL HISTORY:  She has had an appendectomy, cholecystectomy,  and tonsillectomy, also cervical disk surgery.  She has had arthroscopic  surgery on her left knee and may need a  total knee.   SOCIAL HISTORY:  She is unemployed.  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  She is married and has one child.   FAMILY HISTORY:  Really noncontributory.   REVIEW OF SYSTEMS:  She has cataracts, but no major visual impairment.  She has a hiatal hernia, history of gastroesophageal reflux.  Otherwise,  her review of systems are all questioned and are negative.   PHYSICAL EXAMINATION:  VITAL SIGNS:  Her blood pressure is 120/70, pulse  was 67 and regular.  EKG is normal except for a slight left axis  deviation.  She is 5 feet 3 inches, weighs 206.  HEENT:  Normal.  NECK:  Carotid upstrokes were equal bilaterally without bruits, no JVD.  Thyroid is not enlarged.  Trachea is midline.  LUNGS:  Clear to auscultation and percussion.  HEART:  Nondisplaced PMI.  Normal S1, S2.  A soft systolic murmur along  the left sternal border.  S2 splits physiologically.  There is no  diastolic component.  ABDOMEN:  Soft, good bowel sounds.  No midline bruit.  No hepatomegaly.  EXTREMITIES:  No cyanosis, clubbing, or edema.  Pulses are present.  NEUROLOGIC:  Intact.  SKIN:  Unremarkable.   ASSESSMENT:  Ms. [name] has several cardiovascular risk factors, but is  totally asymptomatic at present.  I have advised her to be extremely  careful with her weight and the risk of developing diabetes.  Her blood  pressure is under excellent control and she has remarkably good lipid  panel.   I have advised her to stay as active as she can considering her  orthopedic limitations.  If she begins to have any symptoms of angina  which I have described in detail, she will let Dr. [name] or I know.  I will see her back in 12 months or p.r.n.     Thomas C. Daleen Squibb, MD, [facility]  Electronically Signed    TCW/MedQ  DD: 11/17/2008  DT: 11/18/2008  Job #: [id number]   cc:   Patrica Duel, M.D.

## 2011-05-10 NOTE — Op Note (Signed)
NAMEDESTYNI, HOPPEL                ACCOUNT NO.:  [id number]   MEDICAL RECORD NO.:  [id number]          PATIENT TYPE:  OIB   LOCATION:  3524                         FACILITY:  [facility]   PHYSICIAN:  Hewitt Shorts, M.D.DATE OF BIRTH:  04/24/1946   DATE OF PROCEDURE:  09/03/2008  DATE OF DISCHARGE:                               OPERATIVE REPORT   PREOPERATIVE DIAGNOSES:  1. C6-C7 cervical disk herniation.  2. Cervical spondylosis.  3. Cervical degenerative disk disease.  4. C6-C7 anterolisthesis.   POSTOPERATIVE DIAGNOSES:  1. C6-C7 cervical disk herniation.  2. Cervical spondylosis.  3. Cervical degenerative disk disease.  4. C6-C7 anterolisthesis.   PROCEDURE:  C6-C7 anterior cervical decompression and arthrodesis with 9-  mm zero-P implant and plate with DBX putty.   SURGEON:  Hewitt Shorts, M.D.   ASSISTANTS:  Nelia Shi. Webb Silversmith, NP and Stefani Dama, M.D..   ANESTHESIA:  General endotracheal.   INDICATIONS:  The patient is a 65 year old woman status post previous C4-  C5 and C5-C6 anterior cervical diskectomy and fusion over 4 years ago.  She did well from that surgery but developed left cervical radicular  symptoms.  X-rays and MRI scan were done.  She has anterolisthesis at C6-  C7 that appears static to flexion and extension with spondylosis and  degenerative disk disease at that level and spondylotic disk protrusion  with a focal disk protrusion of the left C6-C7 neural foramen, and a  decision made to proceed with single-level anterior cervical diskectomy  and fusion.   PROCEDURE:  The patient was brought to the operating room and placed  under general endotracheal anesthesia.  The patient was placed in 10-  pound Holter traction.  The neck was prepped with Betadine soap and  solution and draped in a sterile fashion.  The previous left-sided  incision was reopened.  The line of the incision was infiltrated with  local anesthetic with epinephrine.   Dissection was carried down to the  subcutaneous tissue and platysma.  Bipolar electrocautery was used to  maintain hemostasis.  Dissection was then carried out through the plane  leaving the sternocleidomastoid, carotid artery, and jugular vein  laterally and trachea and esophagus medially.  The ventral aspect of the  vertebral cord was identified.  The previous plate was identified, and  we dissected the inferior aspect of the plate and just below that  exposed the annulus of the C6-C7 disk anteriorly.  This was incised.  We  entered into the disk space with pituitary rongeurs and performed an  initial discectomy, and then the microscope was draped and brought into  the field to provide additional navigation, illumination, and  visualization.  The remainder of the decompression was performed using  microdissection and microsurgical technique.  The discectomy was  continued using micro curettes and pituitary rongeurs, and then, we  began to remove the cartilaginous endplate surfaces from the vertebral  bodies using micro curettes along with the X-Max drill and posterior  osteophytic overgrowth was removed using the X-Max drill along with a 2-  mm Kerrison punch with a thn footplate.  The posterior longitudinal  ligament was removed, and the spondylotic  disk herniation was similarly  removed, decompressing the spinal canal and thecal sac.  We then turned  our attention to the neural foramina.  The spondylotic disk herniation  within the left C6-C7 neural foramen was mobilized and removed, and we  decompressed the axillary C7 nerve roots bilaterally.  Once the  discectomy was completed, a good decompression of the thecal sac and  nerve roots was achieved.  We established hemostasis with the use of  Gelfoam soaked in thrombin.  The Gelfoam was removed.  We measured the  height of the intervertebral disk space.  We selected a 9-mm parallel  implant.  We used a 0-P implant.  It was tacked  with DBX putty from a 1-  mL syringe, and then the implant was carefully positioned in the  intervertebral disk space and countersunk.  Notably, the anterolisthesis  at C6 and C7 were made.  We placed 16-mm screws going inferiorly into C7  and 12-mm screws going superiorly into C6.  Final tightening was done of  all four screws and x-ray showed good positioning of the implant and  screws.  Again, the anterolisthesis was noted.  The wound was irrigated  with bacitracin solution, checked for hemostasis, which was confirmed.  Then, we proceeded with closure.  The platysma was closed with  interrupted inverted 2-0 undyed Vicryl sutures.  The subcutaneous and  subcuticular were closed with interrupted inverted 3-0 undyed Vicryl  sutures.  The skin was re-approximated with Dermabond.  The procedure  was tolerated well.  The estimated blood loss was 40 mL.  Sponge and  needle count were correct.  Following surgery, the patient was taken out  of the surgery traction, placed in a soft cervical collar to be reversed  from the anesthetic effects, extubated, and transferred to the recovery  room for further care.      Hewitt Shorts, M.D.  Electronically Signed     RWN/MEDQ  D:  09/03/2008  T:  09/04/2008  Job:  [id number]

## 2011-05-10 NOTE — H&P (Signed)
NAMEJANETTE, [name]                ACCOUNT NO.:  [id number]   MEDICAL RECORD NO.:  [id number]           PATIENT TYPE:  AMB   LOCATION:  DAY                           FACILITY:  [facility]   PHYSICIAN:  Tilda Burrow, M.D. DATE OF BIRTH:  [birth year]   DATE OF ADMISSION:  DATE OF DISCHARGE:  LH                              HISTORY & PHYSICAL   ADMISSION DIAGNOSIS:  Endometrial polyp, 11 mm thickness.   HISTORY OF PRESENT ILLNESS:  This 65 year old postmenopausal female had  an episode of postmenopausal bleeding which was evaluated with a pelvic  ultrasound which showed an 11-17 mm endometrial stripe.  She has been on  Femhrt times 10 months with no prior spotting.  The suspicion of a polyp  or a fibroid has been dealt with endometrial biopsy which was benign,  suggestive of a polyp, but we would need to remove it to prevent  recurrences.  The patient understands the procedure and polypectomy is  planned.  We plan to see her on the day before surgery in the office,  try to insert a laminaria which was to distend the cervix overnight  allowing for improved access.  She is aware that incomplete removal of  the polyp is a possibility, depending on access to the uterus.   FAMILY HISTORY:  Notable for multiple hysterectomies, mother and two  sisters with hysterectomy.  One sister had four endometrial  polypectomies in the past.   PAST MEDICAL HISTORY:  Benign.   SURGICAL HISTORY:  Cigarettes, alcohol, recreational drugs denied.  She  has a mild heart murmur.  Uses antibiotics at dental procedures.   ALLERGIES:  PENICILLIN AND DARVOCET.  SHE CANNOT TAKE KEFLEX.  SHE CAN  TAKE __________.   PHYSICAL EXAMINATION:  HEENT:  Pupils equal, round, reactive.  Extraocular movements intact.  NECK:  Supple.  CHEST: Clear to auscultation.  ABDOMEN:  Nontender.  EXTERNAL GENITALIA:  Normal.  Multiparous, postmenopausal.  Cervix class  I Pap.  Small endocervix.  Will place laminaria the day before  surgery.   PLAN:  Removal of endometrial polyp August 02, 2007, at 7:30 a.m.   ADDENDUM:   MEDICATIONS:  1. Singulair 10 mg one p.o. daily.  2. Valium 5 mg p.o. and p.r.n.  3. Inderal 40 mg one daily.  4. Femhrt daily.  5. Prozac 20 mg daily one daily.  6. BuSpar 10 mg one daily.  7. Nexium 40 mg one daily.  8. HCTZ 25 mg 1/2 tablet daily.  9. Biotin 300 __________ daily.  10.Centrum Silver multivitamin one daily.      Tilda Burrow, M.D.  Electronically Signed     JVF/MEDQ  D:  07/31/2007  T:  07/31/2007  Job:  [id number]

## 2011-05-10 NOTE — Op Note (Signed)
[name], [name]                ACCOUNT NO.:  [id number]   MEDICAL RECORD NO.:  [id number]          PATIENT TYPE:  OBV   LOCATION:  A319                          FACILITY:  [facility]   PHYSICIAN:  Tilda Burrow, M.D. DATE OF BIRTH:  [birth year]   DATE OF PROCEDURE:  DATE OF DISCHARGE:                               OPERATIVE REPORT   PREOPERATIVE DIAGNOSES:  Endometrial polyp.   POSTOPERATIVE DIAGNOSES:  Endometrial polyp not found.  Mucus plug and  uterine perforation.   PROCEDURE:  Hysteroscopy.   SURGEON:  Dr. Christin Bach.   ASSISTANT:  None.   ANESTHESIA:  General.   SPECIMEN:  None.   ESTIMATED BLOOD LOSS:  Minimal.   COMPLICATIONS:  Uterine fundal perforation noted at uterine sounding and  initiation of procedure.   DETAILS OF PROCEDURE:  The patient was taken operating room, prepped and  draped for vaginal procedure, having received IV antibiotics Cleocin  perioperatively.  A speculum was inserted revealing the gauze and the  tip of the laminaria in proper position in the cervix which was very  posteriorly oriented.  The gauze was removed.  This was discarded and  area and ring forceps used to grasp the string attached to the laminaria  and the laminaria was extracted without difficulty.  There was a small  amount of bleeding associated with extraction of the laminaria.  The  cervix was then grasped with single-tooth tenaculum.  Downward traction  applied to place the uterus into a linear axis with the vagina, and a  uterine sound used to sound the uterus.  The first sounding seemed to  show a depth of the uterus of about 6 cm, and then the second time the  uterus passed easily to a depth of 10 cm which seemed too deep,  suggestive of uterine perforation.  Sounding was discontinued.  There  was a thick, gelatinous mucus plug which was being extruded from the  cervical os which was then grasped with a ring forceps and extracted  from the opening.  This contained  no tissue was discarded.  Hysteroscopy  was then inspected to identify the uterine cavity.  Photo #1 shows the  endocervical canal which is edematous with endocervical glands visible  protruding from the sidewalls.  The lower uterine segment could be  identified somewhat anteriorly and the hysteroscope eased through the  lower uterine segment into the uterine cavity itself.  The uterine  cavity showed evidence of a uterine perforation of the fundus.  We  irrigated it out further, clearing out the debris from the uterine  cavity and indeed there was no polyp to be identified.  Photos were  taken to document the opening at the uterine fundus, and a small dimple  of bruising adjacent to it.  There was no active bleeding and the  irrigation fluid was cut off long enough to see that there was no active  bleeding.  The photo documentation was considered complete and the  hysteroscope removed and the irrigation fluids measured.  There was  approximately 300 mL of fluid that could not be accounted  for in the  collection bag and was felt to represent some soaking of the drapes plus  suspected intraperitoneal mild loss of fluids estimated at a maximum of  300 mL.  The cervix was inspected and no bleeding from the uterus noted.  The procedure was considered completed.  The patient went to recovery  room.  The patient will be observed overnight as a precaution due to the  suspected uterine perforation, which may have been related to the  overnight dilation by the laminaria.      Tilda Burrow, M.D.  Electronically Signed     JVF/MEDQ  D:  08/02/2007  T:  08/02/2007  Job:  [id number]   cc:   [facility]

## 2011-05-13 NOTE — Op Note (Signed)
NAME:  [name], [name]                          ACCOUNT NO.:  [id number]   MEDICAL RECORD NO.:  [id number]                   PATIENT TYPE:  INP   LOCATION:  3014                                 FACILITY:  [facility]   PHYSICIAN:  Hewitt Shorts, M.D.            DATE OF BIRTH:  [birth year]   DATE OF PROCEDURE:  06/16/2004  DATE OF DISCHARGE:                                 OPERATIVE REPORT   PREOPERATIVE DIAGNOSIS:  Cervical spondylosis, degenerative disk disease,  neck pain and radiculopathy.   POSTOPERATIVE DIAGNOSIS:  Cervical spondylosis, degenerative disk disease,  neck pain and radiculopathy.   OPERATION PERFORMED:  C4-5 and C5-6 anterior cervical diskectomy and  arthrodesis with iliac crest allograft and tether cervical plating.   SURGEON:  Hewitt Shorts, M.D.   ASSISTANT:  Danae Orleans. Venetia Maxon, M.D.   ANESTHESIA:  General endotracheal.   INDICATIONS FOR PROCEDURE:  The patient is a 66 year old woman who presented  with neck pain and radiculopathy and found to have relatively advanced  spondylosis and degenerative disk disease at C4-5 and C5-6.  Decision was  made to proceed with decompression and arthrodesis.   DESCRIPTION OF PROCEDURE:  The patient was brought to the operating room and  placed under general endotracheal anesthesia.  The patient was placed in 10  pounds of halter traction.  The neck was prepped with Betadine soap and  solution and draped in sterile fashion.  A horizontal incision was made on  the left side of the neck.  The line of the incision was infiltrated with  local anesthetic and epinephrine and then a horizontal incision was made on  the left side of the neck and carried down through the subcutaneous tissue  and platysma.  Bipolar cautery was used to maintain hemostasis.  Dissection  was carried out to an avascular plane, leaving the sternocleidomastoid,  carotid artery and jugular vein laterally and trachea and esophagus  medially.  The ventral  aspect of the vertebral column identified and a  localizing x-ray was taken.  The C4-5 and C5-6 intervertebral disk space  levels were identified.  Diskectomy was begun with incision through the  annulus.  Anterior osteophytic overgrowth was removed using an osteophyte  removal tool.  The operating microscope was draped and brought into the  field to provide additional magnification, illumination and visualization  and the remainder of the diskectomy and decompression was performed using  microdissection and microsurgical technique.  The disk itself was removed  using microcurets along with pituitary rongeurs and the cartilaginous end  plates of the corresponding vertebrae were removed using microcurets along  with the high speed drill.  Posterior osteophytic overgrowth was removed  using the high speed drill along with 2 mm Kerrison punch with a thin foot  plate.  The posterior longitudinal ligament was thickened at both levels and  we were able to carefully remove that as well.  We were able to  decompress  the neural foramina and spinal canal, thereby decompressing the nerve roots  and thecal sac at each level.  Hemostasis was established with the use of  Gelfoam soaked in Thrombin.  Once hemostasis was established, we proceeded  with the arthrodesis.  The height of the intervertebral disk spaces was  sized using bone spacers.  We selected two 7 mm grafts.  Each of the grafts  was hydrated in saline solution and then the grafts were positioned in the  intervertebral disk space and countersunk.  We selected a 29 mm tether  cervical plate and it was positioned over the fusion construct and secured  to the C4 and C6 vertebrae each with a pair of 4 x 13 mm screws and placed a  single 4 x 13 mm screw at C5.  Each screw hole was drilled and tapped and  the screw placed.  Screws were placed in alternating fashion.  Once all five  screws were in place they were fully tightened and then the  traction, which  had been discontinued once the bone grafts were in place but prior to the  plating.  The wound was irrigated with bacitracin solution, checked for  hemostasis which was established and confirmed.  An x-ray was taken which  showed the grafts, plate and screws in good position and the alignment was  good.  We then proceeded with closure.  The platysma was closed with  interrupted inverted 2-0 undyed Vicryl sutures.  Subcutaneous and  subcuticular layer were closed with interrupted inverted 3-0 undyed Vicryl  sutures and the skin edges were approximated with DermaBond.  The patient  tolerated the procedure well.  The estimated blood loss was 50 mL.  Sponge,  needle and instrument counts were correct.  Following surgery, the patient  was placed in a soft cervical collar, reversed from his anesthetic,  extubated and transferred to the recovery room for further care where she  was noted to be moving all four extremities to command.                                               Hewitt Shorts, M.D.    RWN/MEDQ  D:  06/16/2004  T:  06/17/2004  Job:  [id number]

## 2011-05-13 NOTE — Op Note (Signed)
NAME:  [name], [name]                          ACCOUNT NO.:  [id number]   MEDICAL RECORD NO.:  [id number]                   PATIENT TYPE:  AMB   LOCATION:  DAY                                  FACILITY:  [facility]   PHYSICIAN:  Lionel December, M.D.                 DATE OF BIRTH:  03/12/1946   DATE OF PROCEDURE:  DATE OF DISCHARGE:  07/30/2002                                 OPERATIVE REPORT   PROCEDURE:  Esophagogastroduodenoscopy.   INDICATIONS FOR PROCEDURE:  [name] is a 65 year old Caucasian female with  over a 10 year history of reflux esophagitis who has done well with PPI for  three years but now is starting to have chest pain, lump in her throat as  well as sore throat. She was placed on Nexium and it was doubled to 40 mg  b.i.d.  Chest pain has gotten better but she is still having throat  symptoms. She is watching her diet.  She does not smoke cigarettes. She has  never had an EGD before. She is undergoing EGD to make sure we are not  dealing with complicated GERD and also to find out if she has a large hiatal  hernia that might explain failure to medical therapy. The procedure is  reviewed with the patient and informed consent was obtained.   PREOPERATIVE MEDICATIONS:  Cetacaine spray for pharyngeal topical  anesthesia, Demerol 50 mg IV, Versed 8 mg IV in divided dose.   INSTRUMENT:  Olympus video system.   FINDINGS:  The procedure was performed in the endoscopy suite. The patient's  vital signs and O2 saturations were monitored during the procedure and  remained stable. The patient was placed in the left lateral decubitus  position. The endoscope was passed through the oropharynx without any  difficulty into the esophagus.   ESOPHAGUS:  The mucosa of the esophagus normal throat. The squamocolumnar  junction was at 31 cm from the incisors. The body of the esophagus was  somewhat tortuous as the GE junction had migrated upwards because of hernia.  The diaphragmatic hiatus  was at 36-37 cm. The hernia was 5-6 cm in length  but very wide. There was no evidence of ring, stricture or Barrett's  esophagus.   STOMACH:  It was empty and distended very well with insufflation. The folds  in the proximal stomach were normal. Examination of the mucosa revealed  linear streaks with erythematous mucosa and antrum with multiple erosions.  The pyloric channel was patent. The angularis, fundus and cardia were  examined. Direct retroflexion of the scope was normal. The hernia was easily  seen in this view.   DUODENUM:  Examination of the bulb revealed normal mucosa. The scope was  passed to the second part where mucosa and folds were normal. The endoscope  was withdrawn. The patient tolerated the procedure well.   FINAL DIAGNOSES:  1. Moderate size sliding hiatal hernia  which I feel would explain adequate     control of gastroesophageal reflux disease symptoms with medical therapy.  2. Erosive antral gastritis. She possibly has H. pylori infection.    RECOMMENDATIONS:  1. She will continue antireflux measures and Nexium at b.i.d. schedule for     now.  2. Will add domperidone 10 mg before evening meal and at bedtime. She is     explained that this is not an FDA approved  medication and she will have     to pay on her own.  3. Feel lap Nissen would be an option for her. I would like to wait and     treat H. pylori gastritis if confirmed.                                                 Lionel December, M.D.    NR/MEDQ  D:  07/30/2002  T:  08/02/2002  Job:  [id number]   cc:   Jonell Cluck, M.D.

## 2011-05-13 NOTE — H&P (Signed)
[name], [name]                ACCOUNT NO.:  [id number]   MEDICAL RECORD NO.:  [id number]          PATIENT TYPE:  AMB   LOCATION:                                FACILITY:  [facility]   PHYSICIAN:  [name], M.D.    DATE OF BIRTH:  10/26/1946   DATE OF ADMISSION:  DATE OF DISCHARGE:  LH                                HISTORY & PHYSICAL   PRIMARY CARE PHYSICIAN:  [name].   HISTORY OF PRESENT ILLNESS:  [name] is a 65 year old Caucasian female  with history of chronic gastroesophageal reflux disease and hiatal hernia  who presents today for further evaluation of upper abdominal pain and  problems with her acid-reflux disease. She was last seen in our office in  November 2003. At that time, it was felt that her GERD symptoms were  refractory to therapy, and she was actually referred for possible anti-  reflux surgery. She ended up with a 24-hour pH probe study off of PPI  therapy and was found to have a very low reflux score, and therefore, it was  felt that she would not benefit for anti-reflux surgery. She continues to  complain of a globus sensation. She has also complained of food getting  stuck in her mid esophageal region. She has to sit a certain way for the  food to go down. She complains of burning and pain in her chest, especially  in the middle of the night. Denies any nausea or vomiting. She has been  having upper abdominal pain, primarily in the epigastric right upper  quadrant region. It tends to be worse at night time, not necessarily related  to meals. She generally has bowel movement every two to three days. About  once a month, she has fecal urgency with loose stools. Denies any melena or  rectal bleeding. Nocturnal chest pain gets better when she sits up and/or  belches. No significant weight changes. She has been on Nexium 40 mg in the  morning and Prilosec OTC at night for quite some time. Recently when she saw  [name], he placed her on Nexium  b.i.d. She has not noted any benefit.  She had an abdominal ultrasound which revealed fatty liver and small hepatic  cyst. LFTs were done, but we do not have those results as of yet.   CURRENT MEDICATIONS:  1.  Tranxene 7.5 mg b.i.d.  2.  Nexium 40 mg in the morning.  3.  Tylenol p.r.n.  4.  Atenolol 50 mg daily.  5.  Hydrochlorothiazide 25 mg daily.  6.  Singulair 10 mg daily.  7.  Advair 2 puffs daily.  8.  Portia daily.  9.  Prilosec OTC nightly.  10. Albuterol inhaler p.r.n.  11. Calcium with vitamin D 1,500 mg daily.   ALLERGIES:  PENICILLIN, KEFLEX, DARVOCET, PERCOCET, ULTRACET.   PAST MEDICAL HISTORY:  1.  Chronic gastroesophageal reflux disease.  2.  Hiatal hernia.  3.  Hypertension.  4.  Heart murmur. She has periodic echocardiograms. The patient states she      requires antibiotics for SBE  prophylaxis.  5.  Asthma.   Last EGD was in August 2003. She had a moderate sized sliding hiatal hernia.  She has erosive antral gastritis, but Helicobacter pylori serologies were  negative. Colonoscopy in February 2003 was normal.   PAST MEDICAL HISTORY:  1.  Tonsillectomy.  2.  Cholecystectomy for biliary dyskinesia.  3.  Appendectomy.  4.  She has had cervical vertebral surgery a couple of years ago.  5.  She has had knee surgery.   FAMILY HISTORY:  Mother recently had bleeding peptic ulcer disease. Father  died of renal failure in his 3s. No family history of colorectal cancer.   SOCIAL HISTORY:  She is married and has one child. She is currently  employed/retired. She is a nonsmoker, never been a smoker. No alcohol use.   REVIEW OF SYSTEMS:  GASTROINTESTINAL/CONSTITUTIONAL:  See HPI for GI and  constitutional. CARDIOPULMONARY:  See HPI for nocturnal chest pain and no  shortness of breath. GENITOURINARY:  No dysuria or hematuria.   PHYSICAL EXAMINATION:  VITAL SIGNS:  Weight 214 pounds, height 5 foot 2.  Temperature 98.7, blood pressure 126/70, pulse 84.  GENERAL:   Pleasant, well-developed, well-nourished, Caucasian female in no  acute distress.  SKIN:  Warm and dry. No jaundice.  HEENT:  Conjunctivae are pink. Sclerae are nonicteric. Oropharyngeal mucosa  is moist and pink. No lesions, erythema or exudate. No lymphadenopathy or  thyromegaly.  CHEST:  Lungs are clear to auscultation.  CARDIAC:  Reveals regular rate and rhythm. Normal S1 and S2 with a short  faint systolic ejection murmur.  ABDOMEN:  Positive bowel sounds, obese, but symmetrical. Soft. Mild  epigastric tenderness to deep palpation. No organomegaly or masses. No  rebound tenderness or guarding. No abdominal bruits or hernias.  EXTREMITIES:  No edema.   IMPRESSION:  [name] is a 65 year old Caucasian female with chronic  gastroesophageal reflux disease as well as chronic dysphagia and globus  sensation who presents now with persistent symptoms as well as  epigastric/right upper quadrant abdominal pain. To my knowledge, she has  never had her esophagus dilated before. She has had barium pill  esophagograms and manometries done in the past for her dysphagia. Given her  epigastric pain and persistent GERD symptoms, it would be reasonable to  proceed with upper endoscopy at this time. She had fatty liver on the  ultrasound. LFTs have been done, but results are pending.   PLAN:  1.  EGD in the near future. The patient states that she requires SBE      prophylaxis, and given that she may have esophageal dilatation, we will      go ahead and provide this. She will receive vancomycin and gentamicin      given      her penicillin allergy.  2.  We will follow up LFT results as available.  3.  Continue Nexium b.i.d. for now.      [name], P.A.      [name], M.D.  Electronically Signed    LL/MEDQ  D:  10/19/2005  T:  10/19/2005  Job:  [id number]   cc:   Patrica [name], M.D.  Fax: [phone]

## 2011-05-13 NOTE — Op Note (Signed)
NAMEBROOKLYNE, RADKE                ACCOUNT NO.:  [id number]   MEDICAL RECORD NO.:  [id number]          PATIENT TYPE:  AMB   LOCATION:  DAY                           FACILITY:  [facility]   PHYSICIAN:  Lionel December, M.D.    DATE OF BIRTH:  [birth year]   DATE OF PROCEDURE:  10/25/2005  DATE OF DISCHARGE:                                 OPERATIVE REPORT   PROCEDURE:  Esophagogastroduodenoscopy with esophageal dilation.   ENDOSCOPIST:  Lionel December, M.D.   INDICATIONS:  [name] is a 64 year old Caucasian female with known chronic  GERD who is not responding to double dose PPI.  She is also complaining of  dysphagia as well as epigastric pain.  Her last EGD was in November2003  revealing moderate size sliding hiatal hernia.  Procedures were reviewed the  patient and informed consent was obtained.   PREOPERATIVE MEDICATION:  Cetacaine spray for oral pharyngeal topical  anesthesia, Demerol 50 mg IV, Versed 15 mg IV.   DESCRIPTION OF PROCEDURE:  Procedure performed in endoscopy suite.  The  patient's vital signs and O2 saturation were monitored during procedure and  remained stable.  The patient was placed in the left lateral position and  Olympus videoscope was passed via </  Oropharynx without any difficulty into esophagus.   Esophagus mucosa of the esophagus was normal throughout. Squamocolumnar  junction was located at 32 cm from the incisors.  No ring or stricture was  obvious. Moderate size sliding hiatal hernia.  Hiatus was at 38 cm from the  incisors.  She had a moderate size hernia with dependent segment of the left  side with some bile in it.   Stomach:  It was empty and distended very well with insufflation.  Folds of  the proximal stomach were normal. Examination of mucosa at body, antrum,  pyloric channel as well as angularis, fundus and cardia was normal.  Hernia  was easily seen on this view.   Duodenum bulbar mucosa was normal.  The scope was passed to the second part  of  duodenum where mucosa and folds were normal.  Endoscope was withdrawn.  The endoscope was withdrawn.   Esophagus was dilated by passing 56-French Maloney dilator to full  insertion. As the dilator was withdrawn, the endoscope was passed again and  there was a linear tear at the cervical esophagus possibly indicative of a  web.  The endoscope was withdrawn.  The patient tolerated the procedure  well.   FINAL DIAGNOSIS:  1.  No endoscopic evidence of reflux esophagitis.  2.  No evidence of Barrett's esophagus or complicated esophagitis.  3.  Moderate size sliding hiatal hernia.  4.  Normal examination of stomach and second part of duodenum.  5.  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   RECOMMENDATIONS:  1.  She will continue anti-reflux measures and a PPI at a double dose.  2.  Reglan 10 mg before evening meal and 10 mg at bedtime.  Prescription      given  for 60 with one refill.  If she has side effects, she will give [place]      a call.  Otherwise return for OV in 8 weeks.  If she does not respond to      this therapy, she may consider anti-reflux surgery.      Lionel December, M.D.  Electronically Signed     NR/MEDQ  D:  10/25/2005  T:  10/25/2005  Job:  [id number]   cc:   Patrica Duel, M.D.  Fax: [phone]

## 2011-05-13 NOTE — Op Note (Signed)
[facility]  Patient:    [name], [name]                       MRN: [id number] Proc. Date: 08/02/01 Adm. Date:  [id number] Attending:  Charlton Haws                           Operative Report  PREOPERATIVE DIAGNOSIS:  Acute appendicitis.  POSTOPERATIVE DIAGNOSIS:  Acute appendicitis.  OPERATION PERFORMED:  Laparoscopic appendectomy.  SURGEON:  Currie Paris, M.D.  ANESTHESIA:  General endotracheal.  CLINICAL HISTORY:  This patient is a 65 year old woman with a one day history of abdominal pain localized to the right lower quadrant with marked right lower quadrant tenderness with some rebound. She had a white count of 12,000 and a CT scan that was consistent with acute appendicitis.  The patient was counselled regarding surgery and the recommendation by me was to proceed and she agreed.  DESCRIPTION OF PROCEDURE:  The patient was brought to the operating room and after satisfactory general endotracheal anesthesia had been obtained, the abdomen was prepped and draped and a Foley catheter placed. The incisions sites were injected with 0.25% Marcaine as they were made. The umbilical incision was made first, the fascia identified and the peritoneal cavity entered under direct vision. The Hasson was introduced, the abdomen insufflated. There was some omentum overlying where I thought the appendix would be.  A 5 mm trocar was placed in the right upper quadrant and an 11 in the left lower, both under direct vision. The omentum is manipulated off of the appendix, the appendix and the mesoappendix were markedly edematous consistent with early acute appendicitis. There was no evidence of perforation. There was a little cloudy fluid in the pelvis which was suctioned out at the end of the case and irrigated.  The mesoappendix was divided with the harmonic scalpel down to the base of the appendix. The base of the appendix was divided with the  endoGIA vascular staples. This produced a good closure. There was essentially no bleeding.  The appendix was placed in a bag and brought out the umbilical port. The umbilical port was replaced and we made a final irrigation check for hemostasis. The area of the uterus and right tube all looked okay. I couldnt really visualize the left ovary very well. There was no evidence of any other inflammatory process and fortunately no adhesions up around the liver and gallbladder.  The right upper quadrant port was removed under direct vision and there was no bleeding. Similar for the left lower quadrant port. The abdomen was deflated through the umbilical port and the pursestring tied down taking care that we had some upper tracts to be sure no bowel got caught in it.  The skin was closed with 4-0 monocryl subcuticular plus Steri-Strips. The patient tolerated the procedure well. There were no operative complications. All counts were correct. DD:  08/02/01 TD:  08/03/01 Job: [id number]

## 2011-05-13 NOTE — Op Note (Signed)
[facility]  Patient:    [name], [name] Visit Number: [id number] MRN: [id number]          Service Type: Attending:  Lionel December, M.D. Dictated by:   Lionel December, M.D. Proc. Date: 02/13/02   CC:         Patrica Duel, M.D.   Operative Report  PROCEDURE:  Total colonoscopy.  ENDOSCOPIST:  Lionel December, M.D.  INDICATION:  The patient is a 64 year old Caucasian female who is undergoing screening colonoscopy.  She really does not have any symptoms other than occasional fleeting pain in the right lower quadrant of her abdomen; she also had tendency towards constipation and her bowels generally move every third day.  Procedure and risks were reviewed with the patient and informed consent was obtained.  She has history of heart murmur and it was decided to give her SBE prophylaxis with ampicillin and gentamicin.  She told us that she developed rash with Keflex but has had no problems with penicillin and we therefore gave her ampicillin.  She did develop facial flushing without any other symptoms.  She was treated with Benadryl and Decadron with resolution of her facial flushing. She did not have any throat symptoms or bronchospasm.  Procedure and risks were reviewed with the patient and informed consent was obtained.  PREOPERATIVE MEDICATIONS:  Demerol 50 mg and Versed 10 mg in divided dose.  INSTRUMENT:  Olympus video system.  FINDINGS:  Procedure performed in endoscopy suite.  Patients vital signs and O2 saturations were monitored during the procedure and remained stable. Rectal examination was performed and no abnormality noted on external or digital exam.  Scope was placed into rectum and advanced under direct vision into sigmoid colon and beyond.  There was redundant colon.  It was difficult to keep the scope straight.  Using the loop, I was able to pass the scope into cecum, which was identified by ileocecal valve and appendiceal  stump. Pictures were taken for the record.  As the scope was withdrawn, mucosa was once again carefully examined.  No polyps and/or tumor masses were noted; I also did not see any diverticula.  The rectal mucosa was normal.  The scope was retroflexed to examine anorectal junction, which was also normal. Endoscope was straightened and withdrawn.  Patient tolerated the procedure well.  FINAL DIAGNOSIS:  Normal colonoscopy to cecum.  Please note that the patient had facial flushing with ampicillin given for subacute bacterial endocarditis prophylaxis but she did not have any other systemic side-effects.  RECOMMENDATIONS:  She should continue yearly Hemoccults.  She should stay on a high-fiber diet and she can take Colace b.i.d. if she needs to.  If she needs more than a stool softener, next step would be lactulose. Dictated by:   Lionel December, M.D. Attending:  Lionel December, M.D. DD:  02/13/02 TD:  02/14/02 Job: [id number]

## 2011-05-13 NOTE — Op Note (Signed)
   NAME:  [name], [name]                          ACCOUNT NO.:  [id number]   MEDICAL RECORD NO.:  [id number]                   PATIENT TYPE:  AMB   LOCATION:  ENDO                                 FACILITY:  [facility]   PHYSICIAN:  John C. Madilyn Fireman, M.D.                 DATE OF BIRTH:  May 14, 1946   DATE OF PROCEDURE:  DATE OF DISCHARGE:  11/25/2002                                 OPERATIVE REPORT   INDICATIONS FOR PROCEDURE:  Patient undergoing evaluation for anti-reflux  surgery due to poorly controlled symptoms, presumably due to  gastroesophageal reflux.   RESULTS:  Total DeMeester score was 2.9, which was normal.  Upright time and  reflux, recumbent time and reflux, total time and reflux, and longest  episode of reflux were all within normal limits.  Few symptoms were reported  during the stay. There was poor correlation between reflux episodes and  reported symptoms.   IMPRESSION:  Normal study.   PLAN:  Formal GI consultation to evaluate symptoms in light of these  results.                                               John C. Madilyn Fireman, M.D.    JCH/MEDQ  D:  12/08/2002  T:  12/08/2002  Job:  [id number]   cc:   Velora Heckler, M.D.  [address] [place]  [place] [address]  Fax: [phone]

## 2011-05-13 NOTE — Op Note (Signed)
   NAME:  [name], [name]                          ACCOUNT NO.:  [id number]   MEDICAL RECORD NO.:  [id number]                   PATIENT TYPE:  AMB   LOCATION:  ENDO                                 FACILITY:  [facility]   PHYSICIAN:  John C. Madilyn Fireman, M.D.                 DATE OF BIRTH:  08/14/1946   DATE OF PROCEDURE:  11/25/2002  DATE OF DISCHARGE:  11/25/2002                                 OPERATIVE REPORT   INDICATIONS FOR PROCEDURE:  Patient undergoing evaluation for possible  antireflux surgery due to poorly controlled symptoms, presumably secondary  to gastroesophageal reflux.   RESULTS:  Upper esophageal sphincter:  Not evaluated.   Esophageal body:  9 out of 10 peristaltic contractions with some high-  amplitude contractions in the mid and distal leads.   Lower esophageal sphincter:  Increased resting LES pressure at 56.6 mm.  Decreased relaxation at 59%.   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   PLAN:  A formal GI consultation to evaluate symptoms in light of these  results.                                               John C. Madilyn Fireman, M.D.    JCH/MEDQ  D:  12/08/2002  T:  12/08/2002  Job:  [id number]   cc:   Velora Heckler, M.D.  [address] [place]  [place] [address]  Fax: [phone]

## 2011-09-27 ENCOUNTER — Other Ambulatory Visit (HOSPITAL_COMMUNITY): Payer: Self-pay | Admitting: Internal Medicine

## 2011-09-27 DIAGNOSIS — R109 Unspecified abdominal pain: Secondary | ICD-10-CM

## 2011-09-28 LAB — GLUCOSE, CAPILLARY: Glucose-Capillary: 180 — ABNORMAL HIGH

## 2011-09-28 LAB — BASIC METABOLIC PANEL
BUN: 17
CO2: 26
Calcium: 9.5
Chloride: 105
Creatinine, Ser: 1.25 — ABNORMAL HIGH
GFR calc Af Amer: 53 — ABNORMAL LOW
GFR calc non Af Amer: 44 — ABNORMAL LOW
Glucose, Bld: 116 — ABNORMAL HIGH
Potassium: 4
Sodium: 140

## 2011-09-28 LAB — CBC
HCT: 39
Hemoglobin: 13.1
MCHC: 33.6
MCV: 91.6
Platelets: 284
RBC: 4.26
RDW: 14.1
WBC: 9.2

## 2011-09-29 ENCOUNTER — Ambulatory Visit (HOSPITAL_COMMUNITY)
Admission: RE | Admit: 2011-09-29 | Discharge: 2011-09-29 | Disposition: A | Discharge status: Home / Self Care | Payer: 59 | Source: Ambulatory Visit | Attending: Internal Medicine | Admitting: Internal Medicine

## 2011-09-29 DIAGNOSIS — K3189 Other diseases of stomach and duodenum: Secondary | ICD-10-CM | POA: Insufficient documentation

## 2011-09-29 DIAGNOSIS — R109 Unspecified abdominal pain: Secondary | ICD-10-CM

## 2011-09-29 DIAGNOSIS — K449 Diaphragmatic hernia without obstruction or gangrene: Secondary | ICD-10-CM | POA: Insufficient documentation

## 2011-09-29 DIAGNOSIS — R1013 Epigastric pain: Secondary | ICD-10-CM | POA: Insufficient documentation

## 2011-09-29 DIAGNOSIS — Q619 Cystic kidney disease, unspecified: Secondary | ICD-10-CM | POA: Insufficient documentation

## 2011-09-29 DIAGNOSIS — K7689 Other specified diseases of liver: Secondary | ICD-10-CM | POA: Insufficient documentation

## 2011-09-29 DIAGNOSIS — N200 Calculus of kidney: Secondary | ICD-10-CM | POA: Insufficient documentation

## 2011-09-29 MED ORDER — IOHEXOL 300 MG/ML  SOLN
100.0000 mL | Freq: Once | INTRAMUSCULAR | Status: AC | PRN
  Administered 2011-09-29: 100 mL via INTRAVENOUS

## 2011-10-04 ENCOUNTER — Other Ambulatory Visit (HOSPITAL_COMMUNITY): Payer: Self-pay | Admitting: Family Medicine

## 2011-10-04 DIAGNOSIS — D381 Neoplasm of uncertain behavior of trachea, bronchus and lung: Secondary | ICD-10-CM

## 2011-10-05 ENCOUNTER — Other Ambulatory Visit: Payer: Self-pay | Admitting: Obstetrics and Gynecology

## 2011-10-05 ENCOUNTER — Ambulatory Visit (HOSPITAL_COMMUNITY)
Admission: RE | Admit: 2011-10-05 | Discharge: 2011-10-05 | Disposition: A | Discharge status: Home / Self Care | Payer: 59 | Source: Ambulatory Visit | Attending: Family Medicine | Admitting: Family Medicine

## 2011-10-05 DIAGNOSIS — J984 Other disorders of lung: Secondary | ICD-10-CM | POA: Insufficient documentation

## 2011-10-05 DIAGNOSIS — D381 Neoplasm of uncertain behavior of trachea, bronchus and lung: Secondary | ICD-10-CM | POA: Insufficient documentation

## 2011-10-05 DIAGNOSIS — Z139 Encounter for screening, unspecified: Secondary | ICD-10-CM

## 2011-10-05 MED ORDER — IOHEXOL 300 MG/ML  SOLN
80.0000 mL | Freq: Once | INTRAMUSCULAR | Status: AC | PRN
  Administered 2011-10-05: 80 mL via INTRAVENOUS

## 2011-10-06 ENCOUNTER — Ambulatory Visit (HOSPITAL_COMMUNITY)
Admission: RE | Admit: 2011-10-06 | Discharge: 2011-10-06 | Disposition: A | Discharge status: Home / Self Care | Payer: 59 | Source: Ambulatory Visit | Attending: Obstetrics and Gynecology | Admitting: Obstetrics and Gynecology

## 2011-10-06 DIAGNOSIS — Z1231 Encounter for screening mammogram for malignant neoplasm of breast: Secondary | ICD-10-CM | POA: Insufficient documentation

## 2011-10-06 DIAGNOSIS — Z139 Encounter for screening, unspecified: Secondary | ICD-10-CM

## 2011-10-07 ENCOUNTER — Encounter (INDEPENDENT_AMBULATORY_CARE_PROVIDER_SITE_OTHER): Payer: Self-pay | Admitting: *Deleted

## 2011-10-10 LAB — CBC
HCT: 32.3 — ABNORMAL LOW
HCT: 38.2
Hemoglobin: 11.1 — ABNORMAL LOW
Hemoglobin: 13.1
MCHC: 34.2
MCV: 90.1
Platelets: 233
RBC: 4.24
WBC: 9.3

## 2011-10-10 LAB — COMPREHENSIVE METABOLIC PANEL
CO2: 27
Calcium: 9.3
Creatinine, Ser: 0.92
GFR calc non Af Amer: >60
Glucose, Bld: 105 — ABNORMAL HIGH

## 2011-10-10 LAB — DIFFERENTIAL
Eosinophils Relative: 3
Lymphocytes Relative: 27
Lymphs Abs: 2.5
Monocytes Absolute: 0.9 — ABNORMAL HIGH

## 2011-10-11 ENCOUNTER — Ambulatory Visit: Payer: 59 | Admitting: Gastroenterology

## 2011-10-25 ENCOUNTER — Encounter (INDEPENDENT_AMBULATORY_CARE_PROVIDER_SITE_OTHER): Payer: Self-pay | Admitting: Internal Medicine

## 2011-10-25 ENCOUNTER — Ambulatory Visit (INDEPENDENT_AMBULATORY_CARE_PROVIDER_SITE_OTHER): Payer: 59 | Admitting: Internal Medicine

## 2011-10-25 VITALS — BP 142/72 | HR 80 | Temp 98.6°F | Ht <= 58 in | Wt 205.2 lb

## 2011-10-25 DIAGNOSIS — K3184 Gastroparesis: Secondary | ICD-10-CM

## 2011-10-25 DIAGNOSIS — R109 Unspecified abdominal pain: Secondary | ICD-10-CM

## 2011-10-25 DIAGNOSIS — I10 Essential (primary) hypertension: Secondary | ICD-10-CM | POA: Insufficient documentation

## 2011-10-25 DIAGNOSIS — R1013 Epigastric pain: Secondary | ICD-10-CM

## 2011-10-25 DIAGNOSIS — R1030 Lower abdominal pain, unspecified: Secondary | ICD-10-CM

## 2011-10-25 MED ORDER — HYOSCYAMINE SULFATE 0.125 MG SL SUBL: 0.1250 mg | SUBLINGUAL_TABLET | SUBLINGUAL | Status: DC | PRN

## 2011-10-25 NOTE — Progress Notes (Signed)
Subjective:     Patient ID: [name], female   DOB: 09/16/1946, 65 y.o.   MRN: [id number]  HPIMickie is a 65 yr old female here today as a referral from Dr. Sherwood Gambler.  She c/o in her lower abdomen and upper abdominal pain x 2 months.  Pain is not constant. She feels bloated. Bloating is all the time. Appetite is good. No weight loss.  No dysphagia. Acid reflux ion occasion.   Nexium is controlling her symptoms.  She has a BM about one every other day. No melena or bright red rectal bleeding.  She underwent and EGD in January of 2009 at [facility] for recurrent upper abdominal and chest pain.  Hx of chronic GERD and maintained on antireflux measure of Nexium: No endoscopic evidence of reflux esophagitis. Moderate sized sliding hiatal hernia with wide open hiatus. Normal exam of stomach, first and second part of duodenum. Suspect her gastroesophageal reflux disease symptoms are poorly controlled secondary to moderate sized hernia.    LABS 09/29/2011:  Amylase 47, Total bili 0.4, ALP 72, AST 10, ALT 13, ALT 13, total protein 5.6, Albumin 3.5, Calcium 9.0, NA 138, K 4.8, Chloride 108, CO2 23, Random glucose 103, BUN 25, Creatinine 1.08, Lipase 30, TIBC 339, Vitamin B12 271, Iron 73, Ferritin 61, Folate 18.7, % Sat 22%,  HGB 11.8,  HCT 37.5, MCV 92.1 Platelets 324  Urinalysis:09/24/11 Ketones +qual, Spec gravit 1.025, Blood +, PH 5.5, Urobilinogen 0.2 EU, Nitrates normal. Leuk est-ua+ high.    09/29/2011 CT abdomen and pelvis with  CM IMPRESSION:  Large hiatal hernia.  Associated gastric and proximal duodenum distention without  obstruction. Delayed gastric emptying or gastroparesis are  considerations.    DATE OF PROCEDURE: 11/25/2002  DATE OF DISCHARGE: 11/25/2002  OPERATIVE REPORT  INDICATIONS FOR PROCEDURE: Patient undergoing evaluation for possible  antireflux surgery due to poorly controlled symptoms, presumably secondary  to gastroesophageal reflux.  RESULTS: Upper esophageal sphincter: Not  evaluated.  Esophageal body: 9 out of 10 peristaltic contractions with some high-  amplitude contractions in the mid and distal leads.  Lower esophageal sphincter: Increased resting LES pressure at 56.6 mm.  Decreased relaxation at 59%.  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  PLAN: A formal GI consultation to evaluate symptoms in light of these  results.  John C. Madilyn Fireman, M.D.  07/30/2002 EGD: Dr. Karilyn Cota   FINAL DIAGNOSES:  1. Moderate size sliding hiatal hernia which I feel would explain adequate  control of gastroesophageal reflux disease symptoms with medical therapy.  2. Erosive antral gastritis. She possibly has H. pylori infection.     Review of Systems see hpi Current Outpatient Prescriptions  Medication Sig Dispense Refill  . aspirin 325 MG EC tablet Take 325 mg by mouth daily.        . cholecalciferol (VITAMIN D) 400 UNITS TABS Take by mouth.        . clindamycin (CLEOCIN) 300 MG capsule Take 300 mg by mouth as needed.        . diazepam (VALIUM) 5 MG tablet Take 5 mg by mouth every 8 (eight) hours as needed.        Marland Kitchen esomeprazole (NEXIUM) 40 MG capsule Take 40 mg by mouth daily before breakfast.        . gabapentin (NEURONTIN) 300 MG capsule Take 300 mg by mouth at bedtime.        . hydrochlorothiazide (HYDRODIURIL) 25 MG tablet Take 25 mg by mouth daily.        Marland Kitchen  HYDROcodone-acetaminophen (VICODIN) 5-500 MG per tablet Take 1 tablet by mouth every 6 (six) hours as needed.        . meloxicam (MOBIC) 15 MG tablet Take 15 mg by mouth daily.        . montelukast (SINGULAIR) 10 MG tablet Take 10 mg by mouth at bedtime.        . norethindrone-ethinyl estradiol (FEMHRT 1/5) 1-5 MG-MCG TABS Take by mouth daily.        . Probiotic Product (ALIGN) 4 MG CAPS Take by mouth.        . hyoscyamine (LEVSIN/SL) 0.125 MG SL tablet Place 1 tablet (0.125 mg total) under the tongue every 4 (four) hours as needed for cramping.   90 tablet  1   Past Surgical History  Procedure Date  . Appendectomy   . Cholecystectomy   . Neck surgery     x 2   . Knee arthroscopy     Left   Past Medical History  Diagnosis Date  . Hypertension     for years  . Irregular heart beat   . Murmur     since birth   Family Status  Relation Status Death Age  . Mother Alive     Alzheimer  . Father Deceased     Renal failure age63  . Sister Alive     good health. One sister has Addison's disease   History   Social History Narrative  . No narrative on file   Allergies  Allergen Reactions  . Penicillins       Objective:   Physical Exam  Filed Vitals:   10/25/11 1110  BP: 142/72  Pulse: 80  Temp: 98.6 F (37 C)  Height: 4\' 6"  (1.372 m)  Weight: 205 lb 3.2 oz (93.078 kg)     Alert and oriented. Skin warm and dry. Oral mucosa is moist. Natural teeth in good condition. Sclera anicteric, conjunctivae is pink. Thyroid not enlarged. No cervical lymphadenopathy. Lungs clear. Heart regular rate and rhythm.  Abdomen is soft. Bowel sounds are positive. No hepatomegaly. No abdominal masses felt. No tenderness.  No edema to lower extremities. Patient is alert and oriented.     Assessment:    Epigastric pain. Abnormal Ct revealing possible gastroparesis.  Gastroparesis needs to be ruled out. Lower abdominal pain which may be related to her epigastric pain.   I discussed this case with Dr. Karilyn Cota.  Plan:  Gastric emptying study. If abnormal will do EGD.  Levsin e-prescribed to [facility].

## 2011-10-25 NOTE — Patient Instructions (Addendum)
Will get a gastric emptying study. If abnormal, will proceed with an EGD. I discussed this case with Dr. Karilyn Cota.Abdominal Pain Abdominal pain can be caused by many things. Your caregiver decides the seriousness of your pain by an examination and possibly blood tests and X-rays. Many cases can be observed and treated at home. Most abdominal pain is not caused by a disease and will probably improve without treatment. However, in many cases, more time must pass before a clear cause of the pain can be found. Before that point, it may not be known if you need more testing, or if hospitalization or surgery is needed. HOME CARE INSTRUCTIONS    Do not take laxatives unless directed by your caregiver.     Take pain medicine only as directed by your caregiver.     Only take over-the-counter or prescription medicines for pain, discomfort, or fever as directed by your caregiver.     Try a clear liquid diet (broth, tea, or water) for as long as directed by your caregiver. Slowly move to a bland diet as tolerated.  SEEK IMMEDIATE MEDICAL CARE IF:    The pain does not go away.     You have a fever.     You keep throwing up (vomiting).     The pain is felt only in portions of the abdomen. Pain in the right side could possibly be appendicitis. In an adult, pain in the left lower portion of the abdomen could be colitis or diverticulitis.     You pass bloody or black tarry stools.  MAKE SURE YOU:    Understand these instructions.     Will watch your condition.     Will get help right away if you are not doing well or get worse.  Document Released: 09/21/2005 Document Revised: 08/24/2011 Document Reviewed: 07/30/2008 [facility] Patient Information 2012 [place], [place].

## 2011-10-27 ENCOUNTER — Encounter (HOSPITAL_COMMUNITY): Payer: Self-pay

## 2011-10-27 ENCOUNTER — Encounter (HOSPITAL_COMMUNITY)
Admission: RE | Admit: 2011-10-27 | Discharge: 2011-10-27 | Disposition: A | Discharge status: Home / Self Care | Payer: 59 | Source: Ambulatory Visit | Attending: Internal Medicine | Admitting: Internal Medicine

## 2011-10-27 DIAGNOSIS — R109 Unspecified abdominal pain: Secondary | ICD-10-CM | POA: Insufficient documentation

## 2011-10-27 DIAGNOSIS — K3184 Gastroparesis: Secondary | ICD-10-CM

## 2011-10-27 DIAGNOSIS — R933 Abnormal findings on diagnostic imaging of other parts of digestive tract: Secondary | ICD-10-CM | POA: Insufficient documentation

## 2011-10-27 MED ORDER — TECHNETIUM TC 99M SULFUR COLLOID
2.0000 | Freq: Once | INTRAVENOUS | Status: AC | PRN
  Administered 2011-10-27: 2 via ORAL

## 2011-11-03 ENCOUNTER — Telehealth (INDEPENDENT_AMBULATORY_CARE_PROVIDER_SITE_OTHER): Payer: Self-pay | Admitting: *Deleted

## 2011-11-03 NOTE — Telephone Encounter (Signed)
Please schedule an EGD. I have spoken with patient.

## 2011-11-03 NOTE — Telephone Encounter (Signed)
Symptoms are not better. Will proceed with an EGD.   Ann please schedule an EGD. I have spoken with patient.

## 2011-11-03 NOTE — Telephone Encounter (Signed)
Patient left message on machine, wants to talk to Camelia Eng, please call @ [phone]

## 2011-11-04 ENCOUNTER — Encounter (INDEPENDENT_AMBULATORY_CARE_PROVIDER_SITE_OTHER): Payer: Self-pay | Admitting: *Deleted

## 2011-11-04 ENCOUNTER — Other Ambulatory Visit (INDEPENDENT_AMBULATORY_CARE_PROVIDER_SITE_OTHER): Payer: Self-pay | Admitting: *Deleted

## 2011-11-04 DIAGNOSIS — R1013 Epigastric pain: Secondary | ICD-10-CM

## 2011-11-04 NOTE — Telephone Encounter (Signed)
EGD sch'd 12/08/11 at 3:00, patient aware, instructions mailed

## 2011-11-28 ENCOUNTER — Encounter (HOSPITAL_COMMUNITY): Payer: Self-pay | Admitting: Pharmacy Technician

## 2011-11-30 ENCOUNTER — Other Ambulatory Visit (HOSPITAL_COMMUNITY)
Admission: RE | Admit: 2011-11-30 | Discharge: 2011-11-30 | Disposition: A | Discharge status: Home / Self Care | Payer: Medicare Other | Source: Ambulatory Visit | Attending: Obstetrics and Gynecology | Admitting: Obstetrics and Gynecology

## 2011-11-30 ENCOUNTER — Other Ambulatory Visit: Payer: Self-pay | Admitting: Obstetrics and Gynecology

## 2011-11-30 DIAGNOSIS — Z124 Encounter for screening for malignant neoplasm of cervix: Secondary | ICD-10-CM | POA: Insufficient documentation

## 2011-12-07 MED ORDER — SODIUM CHLORIDE 0.45 % IV SOLN
Freq: Once | INTRAVENOUS | Status: AC
  Administered 2011-12-08: 11:00:00 via INTRAVENOUS

## 2011-12-08 ENCOUNTER — Encounter (HOSPITAL_COMMUNITY): Payer: Self-pay | Admitting: *Deleted

## 2011-12-08 ENCOUNTER — Encounter (HOSPITAL_COMMUNITY)
Admission: RE | Disposition: A | Discharge status: Home / Self Care | Payer: Self-pay | Source: Ambulatory Visit | Attending: Internal Medicine

## 2011-12-08 ENCOUNTER — Ambulatory Visit (HOSPITAL_COMMUNITY)
Admission: RE | Admit: 2011-12-08 | Discharge: 2011-12-08 | Disposition: A | Discharge status: Home / Self Care | Payer: 59 | Source: Ambulatory Visit | Attending: Internal Medicine | Admitting: Internal Medicine

## 2011-12-08 DIAGNOSIS — R933 Abnormal findings on diagnostic imaging of other parts of digestive tract: Secondary | ICD-10-CM

## 2011-12-08 DIAGNOSIS — R1013 Epigastric pain: Secondary | ICD-10-CM

## 2011-12-08 DIAGNOSIS — K259 Gastric ulcer, unspecified as acute or chronic, without hemorrhage or perforation: Secondary | ICD-10-CM | POA: Insufficient documentation

## 2011-12-08 DIAGNOSIS — K449 Diaphragmatic hernia without obstruction or gangrene: Secondary | ICD-10-CM

## 2011-12-08 DIAGNOSIS — R109 Unspecified abdominal pain: Secondary | ICD-10-CM | POA: Insufficient documentation

## 2011-12-08 DIAGNOSIS — K2990 Gastroduodenitis, unspecified, without bleeding: Secondary | ICD-10-CM | POA: Insufficient documentation

## 2011-12-08 DIAGNOSIS — K2970 Gastritis, unspecified, without bleeding: Secondary | ICD-10-CM | POA: Insufficient documentation

## 2011-12-08 HISTORY — PX: ESOPHAGOGASTRODUODENOSCOPY: SHX5428

## 2011-12-08 HISTORY — DX: Unspecified osteoarthritis, unspecified site: M19.90

## 2011-12-08 HISTORY — DX: Gastro-esophageal reflux disease without esophagitis: K21.9

## 2011-12-08 SURGERY — EGD (ESOPHAGOGASTRODUODENOSCOPY)
Anesthesia: Moderate Sedation

## 2011-12-08 MED ORDER — MIDAZOLAM HCL 5 MG/5ML IJ SOLN
INTRAMUSCULAR | Status: AC
  Filled 2011-12-08: qty 10

## 2011-12-08 MED ORDER — MEPERIDINE HCL 50 MG/ML IJ SOLN
INTRAMUSCULAR | Status: AC
  Filled 2011-12-08: qty 1

## 2011-12-08 MED ORDER — MIDAZOLAM HCL 5 MG/5ML IJ SOLN
INTRAMUSCULAR | Status: DC | PRN
  Administered 2011-12-08 (×3): 2 mg via INTRAVENOUS

## 2011-12-08 MED ORDER — ESOMEPRAZOLE MAGNESIUM 40 MG PO CPDR: 40.0000 mg | DELAYED_RELEASE_CAPSULE | Freq: Two times a day (BID) | ORAL | Status: DC

## 2011-12-08 MED ORDER — BUTAMBEN-TETRACAINE-BENZOCAINE 2-2-14 % EX AERO
INHALATION_SPRAY | CUTANEOUS | Status: DC | PRN
  Administered 2011-12-08: 2 via TOPICAL

## 2011-12-08 MED ORDER — MEPERIDINE HCL 25 MG/ML IJ SOLN
INTRAMUSCULAR | Status: DC | PRN
  Administered 2011-12-08 (×2): 25 mg via INTRAVENOUS

## 2011-12-08 NOTE — H&P (Addendum)
[name] is an 65 y.o. female.   Chief Complaint: Patient is here for diagnostic EGD. HPI: 65 year old Caucasian female who's been experiencing intermittent right lower quadrant abdominal pain for the last few months. She abdominopelvic CT which revealed dilated stomach and duodenum as well as large hiatal hernia. Gastroparesis  Mentioned in differential diagnosis. Patient therefore referred for diagnostic EGD. She says her heartburn is well controlled with therapy. He denies melena or rectal bleeding diarrhea and/or constipation. Patient's last colonoscopy was in February 2003 and was normal. She has undergone multiple EGD in the past the last one was over 2 years ago at [facility] in [place]. Patient has been on meloxicam for the last 4 months for knee arthritis and has not experienced any side effects. When she was seen in the office about 6 weeks ago she was complaining of epigastric pain she hasn't had recently. She also had gastric emptying study which was abnormal.  Past Medical History  Diagnosis Date  . Irregular heart beat   . Murmur     since birth  . Asthma   . GERD (gastroesophageal reflux disease)   . Arthritis     Past Surgical History  Procedure Date  . Appendectomy   . Cholecystectomy   . Neck surgery     x 2   . Knee arthroscopy     Left    Family History  Problem Relation Age of Onset  . Colon cancer Neg Hx    Social History:  reports that she has never smoked. She does not have any smokeless tobacco history on file. She reports that she does not drink alcohol or use illicit drugs.  Allergies:  Allergies  Allergen Reactions  . Darvocet (Propoxyphene N-Acetaminophen)   . Keflex   . Percocet (Oxycodone-Acetaminophen) Nausea Only  . Penicillins Rash and Other (See Comments)    Redness    Medications Prior to Admission  Medication Dose Route Frequency Provider Last Rate Last Dose  . 0.45 % sodium chloride infusion   Intravenous Once Malissa Hippo, MD 20 mL/hr at  12/08/11 1100    . meperidine (DEMEROL) 50 MG/ML injection           . midazolam (VERSED) 5 MG/5ML injection            Medications Prior to Admission  Medication Sig Dispense Refill  . diazepam (VALIUM) 5 MG tablet Take 2.5 mg by mouth at bedtime.       Marland Kitchen esomeprazole (NEXIUM) 40 MG capsule Take 40 mg by mouth daily before breakfast.        . gabapentin (NEURONTIN) 300 MG capsule Take 300 mg by mouth at bedtime.        . hydrochlorothiazide (HYDRODIURIL) 25 MG tablet Take 25 mg by mouth daily.        . meloxicam (MOBIC) 15 MG tablet Take 15 mg by mouth daily.        . Probiotic Product (ALIGN) 4 MG CAPS Take 4 mg by mouth daily.       Marland Kitchen aspirin 325 MG EC tablet Take 325 mg by mouth daily.        Marland Kitchen HYDROcodone-acetaminophen (VICODIN) 5-500 MG per tablet Take 1 tablet by mouth every 6 (six) hours as needed. Pain      . hyoscyamine (LEVSIN/SL) 0.125 MG SL tablet Place 1 tablet (0.125 mg total) under the tongue every 4 (four) hours as needed for cramping.  90 tablet  1  . montelukast (SINGULAIR) 10 MG  tablet Take 10 mg by mouth at bedtime.          No results found for this or any previous visit (from the past 48 hour(s)). No results found.  Review of Systems  Constitutional: Negative for weight loss.  Gastrointestinal: Negative for diarrhea, constipation, blood in stool and melena.    Blood pressure 146/70, pulse 110, temperature 98 F (36.7 C), temperature source Oral, resp. rate 19, height 5' 2.5" (1.588 m), weight 200 lb (90.719 kg), SpO2 100.00%. Physical Exam  Constitutional: She appears well-developed and well-nourished.  HENT:  Mouth/Throat: Oropharynx is clear and moist.  Eyes: Conjunctivae are normal. No scleral icterus.  Neck: No thyromegaly present.  Cardiovascular: Normal rate, regular rhythm and normal heart sounds.   Murmur: short systolic murmur at LLSB. Respiratory: Effort normal and breath sounds normal.  GI: She exhibits no distension and no mass. There is no  tenderness.  Musculoskeletal: She exhibits no edema.  Lymphadenopathy:    She has no cervical adenopathy.  Neurological: She is alert.  Skin: Skin is warm and dry.     Assessment/Plan Abnormal CT with dilated stomach and duodenum. Mildly abnormal GES. Patient undergoing diagnostic EGD to rule out peptic ulcer disease.  Miriam Kestler U 12/08/2011, 11:42 AM

## 2011-12-08 NOTE — Op Note (Addendum)
EGD PROCEDURE REPORT  PATIENT:  [name]  MR#:  [id number] Birthdate:  Feb 01, 1946, 65 y.o., female Endoscopist:  Dr. Malissa Hippo, MD Referred By:  Dr. Elfredia Nevins, MD Procedure Date: 12/08/2011  Procedure:   EGD  Indications:  Patient is 65 year old Caucasian female who was recently evaluated for abdominal pain and noted to have dilated stomach and proximal duodenum. She had gastric emptying study which is abnormal. She is presently not have any epigastric pain. She does complain of bloating. She says her heartburn is well controlled with therapy. She also has known hiatal hernia. Patient is on meloxicam admission to regular strength aspirin. She is undergoing diagnostic EGD.            Informed Consent:  The risks, benefits, alternatives & imponderables which include, but are not limited to, bleeding, infection, perforation, drug reaction and potential missed lesion have been reviewed.  The potential for biopsy, lesion removal, esophageal dilation, etc. have also been discussed.  Questions have been answered.  All parties agreeable.  Please see history & physical in medical record for more information.  Medications:  Demerol 50 mg IV Versed 6 mg IV Cetacaine spray topically for oropharyngeal anesthesia  Description of procedure:  The endoscope was introduced through the mouth and advanced to the second portion of the duodenum without difficulty or limitations. The mucosal surfaces were surveyed very carefully during advancement of the scope and upon withdrawal.  Findings:  Esophagus:  Mucosa of the esophagus was normal. GE junction was unremarkable. GEJ:  32 cm Hiatus:  38 cm Stomach:  Stomach was empty and distended very well with insufflation. Folds in the proximal stomach were normal. Examination of mucosa revealed few antral erosions along with two ulcers in the prepyloric region one on the left side other one anteriorly and further away from the pylorus. Otic channel was  patent. Angularis fundus and cardia were examined by retroflexing the scope in hernia was recently seen on this view. Duodenum:  Normal bulbar and post bulbar mucosa  Therapeutic/Diagnostic Maneuvers Performed:  None  Complications:  None  Impression: Moderate size sliding hiatal hernia. Erosive antral gastritis with two prepyloric ulcers and patent pylorus. Suspect peptic ulcer disease secondary to NSAID therapy and this may also explain abnormal gastric emptying study.  Recommendations:  Increase Nexium to 40 mg by mouth twice a day. Decrease aspirin to 81 mg daily if alright with Dr. Sherwood Gambler. Patient will call his office to make sure he is in agreement. Discontinue meloxicam. H. pylori serology. Office visit in 2 months  Mega Kinkade U  12/08/2011  12:04 PM  CC: Dr. Elfredia Nevins, MD

## 2011-12-08 NOTE — Progress Notes (Signed)
Lab in to draw h pylori.

## 2011-12-09 ENCOUNTER — Telehealth: Payer: Self-pay | Admitting: Cardiology

## 2011-12-09 LAB — H. PYLORI ANTIBODY, IGG: H Pylori IgG: 0.51 {ISR}

## 2011-12-09 NOTE — Telephone Encounter (Signed)
Yes and change to Enteric coated.

## 2011-12-09 NOTE — Telephone Encounter (Signed)
Please advise regarding message below.

## 2011-12-09 NOTE — Telephone Encounter (Signed)
PT WAS SEEN BY PCP AND HAS ULCERS, SHE IS ON 325 MG ASPRIN AND PCP WANTS HER TO DROP DOWN TO 81MG , SHE IS CALLING TO MAKE SURE THIS WILL BE OKAY/TMJ

## 2011-12-12 ENCOUNTER — Telehealth: Payer: Self-pay | Admitting: *Deleted

## 2011-12-12 NOTE — Telephone Encounter (Signed)
Advised patient that it would be ok for her to take enteric coated 81 mg asa

## 2011-12-23 ENCOUNTER — Encounter (HOSPITAL_COMMUNITY): Payer: Self-pay | Admitting: Internal Medicine

## 2012-01-06 ENCOUNTER — Ambulatory Visit (INDEPENDENT_AMBULATORY_CARE_PROVIDER_SITE_OTHER): Payer: 59 | Admitting: Urology

## 2012-01-06 DIAGNOSIS — N281 Cyst of kidney, acquired: Secondary | ICD-10-CM

## 2012-01-06 DIAGNOSIS — N200 Calculus of kidney: Secondary | ICD-10-CM

## 2012-01-25 ENCOUNTER — Ambulatory Visit (INDEPENDENT_AMBULATORY_CARE_PROVIDER_SITE_OTHER): Payer: 59 | Admitting: Cardiology

## 2012-01-25 ENCOUNTER — Encounter: Payer: Self-pay | Admitting: Cardiology

## 2012-01-25 VITALS — BP 126/69 | HR 96 | Ht 62.5 in | Wt 192.0 lb

## 2012-01-25 DIAGNOSIS — I10 Essential (primary) hypertension: Secondary | ICD-10-CM

## 2012-01-25 DIAGNOSIS — R011 Cardiac murmur, unspecified: Secondary | ICD-10-CM

## 2012-01-25 NOTE — Assessment & Plan Note (Signed)
Under good control. No change in treatment. 

## 2012-01-25 NOTE — Assessment & Plan Note (Signed)
No change on exam. Followup p.r.n.

## 2012-01-25 NOTE — Patient Instructions (Signed)
Your physician recommends that you schedule a follow-up appointment in: As needed  

## 2012-01-25 NOTE — Progress Notes (Signed)
HPI [name] ofor the evaluation and management of her history of a systolic heart murmur.  Echocardiogram last year showed only trivial mitral regurgitation but no other valvular disease. She did have some calcification of the aorta but not the aortic valve.  She denies any symptoms of chest pain or ischemic symptoms. She has mild dyspnea on exertion which she attributes to deconditioning.  She remains on one enteric-coated aspirin 81 mg a day. She had a recent endoscopy that showed some ulcerations in her nonsteroidal anti-inflammatory was stopped.  Past Medical History  Diagnosis Date  . Irregular heart beat   . Murmur     since birth  . Asthma   . GERD (gastroesophageal reflux disease)   . Arthritis     Current Outpatient Prescriptions  Medication Sig Dispense Refill  . Cholecalciferol (VITAMIN D) 2000 UNITS CAPS Take 1 capsule by mouth daily.        . diazepam (VALIUM) 5 MG tablet Take 2.5 mg by mouth at bedtime.       Marland Kitchen esomeprazole (NEXIUM) 40 MG capsule Take 1 capsule (40 mg total) by mouth 2 (two) times daily before a meal.  60 capsule  2  . gabapentin (NEURONTIN) 300 MG capsule Take 300 mg by mouth at bedtime.        . hydrochlorothiazide (MICROZIDE) 12.5 MG capsule Take 12.5 mg by mouth daily.      Marland Kitchen HYDROcodone-acetaminophen (VICODIN) 5-500 MG per tablet Take 1 tablet by mouth every 6 (six) hours as needed. Pain      . hyoscyamine (LEVSIN SL) 0.125 MG SL tablet Place 0.125 mg under the tongue every 4 (four) hours as needed.      . montelukast (SINGULAIR) 10 MG tablet Take 10 mg by mouth at bedtime.        . Multiple Vitamin (MULTIVITAMIN) tablet Take 1 tablet by mouth daily.      . Probiotic Product (ALIGN) 4 MG CAPS Take 4 mg by mouth daily.         Allergies  Allergen Reactions  . Darvocet (Propoxyphene N-Acetaminophen)   . Keflex   . Percocet (Oxycodone-Acetaminophen) Nausea Only  . Penicillins Rash and Other (See Comments)    Redness    Family  History  Problem Relation Age of Onset  . Colon cancer Neg Hx     History   Social History  . Marital Status: Married    Spouse Name: N/A    Number of Children: N/A  . Years of Education: N/A   Occupational History  . Not on file.   Social History Main Topics  . Smoking status: Never Smoker   . Smokeless tobacco: Not on file  . Alcohol Use: No  . Drug Use: No  . Sexually Active: Not on file   Other Topics Concern  . Not on file   Social History Narrative  . No narrative on file    ROS ALL NEGATIVE EXCEPT THOSE NOTED IN HPI  PE  General Appearance: well developed, well nourished in no acute distress HEENT: symmetrical face, PERRLA, good dentition  Neck: no JVD, thyromegaly, or adenopathy, trachea midline Chest: symmetric without deformity Cardiac: PMI non-displaced, RRR, normal S1, S2, no gallop, soft systolic murmur left sternal border Lung: clear to ausculation and percussion Vascular: all pulses full without bruits  Abdominal: nondistended, nontender, good bowel sounds, no HSM, no bruits Extremities: no cyanosis, clubbing or edema, no sign of DVT, no varicosities  Skin: normal color, no rashes Neuro:  alert and oriented x 3, non-focal Pysch: normal affect  EKG  BMET    Component Value Date/Time   NA 141 08/20/2010 1621   K 3.9 08/20/2010 1621   CL 106 08/20/2010 1621   CO2 24 03/10/2009   GLUCOSE 142* 08/20/2010 1621   BUN 20 08/20/2010 1621   CREATININE 1.1 08/20/2010 1621   CALCIUM 9.2 03/10/2009   GFRNONAA 44* 08/29/2008 1517   GFRAA  Value: 53        The eGFR has been calculated using the MDRD equation. This calculation has not been validated in all clinical* 08/29/2008 1517    Lipid Panel  No results found for this basename: chol, trig, hdl, cholhdl, vldl, ldlcalc    CBC    Component Value Date/Time   WBC 15.0* 08/20/2010 1608   RBC 4.56 08/20/2010 1608   HGB 15.0 08/20/2010 1621   HCT 44.0 08/20/2010 1621   PLT 294 08/20/2010 1608   MCV 89.9 08/20/2010  1608   MCH 30.7 08/20/2010 1608   MCHC 34.1 08/20/2010 1608   RDW 14.6 08/20/2010 1608   LYMPHSABS 2.1 08/20/2010 1608   MONOABS 1.0 08/20/2010 1608   EOSABS 0.5 08/20/2010 1608   BASOSABS 0.0 08/20/2010 1608

## 2012-02-06 ENCOUNTER — Encounter (INDEPENDENT_AMBULATORY_CARE_PROVIDER_SITE_OTHER): Payer: Self-pay | Admitting: Internal Medicine

## 2012-02-06 ENCOUNTER — Ambulatory Visit (INDEPENDENT_AMBULATORY_CARE_PROVIDER_SITE_OTHER): Payer: 59 | Admitting: Internal Medicine

## 2012-02-06 DIAGNOSIS — K219 Gastro-esophageal reflux disease without esophagitis: Secondary | ICD-10-CM

## 2012-02-06 DIAGNOSIS — K279 Peptic ulcer, site unspecified, unspecified as acute or chronic, without hemorrhage or perforation: Secondary | ICD-10-CM | POA: Insufficient documentation

## 2012-02-06 NOTE — Patient Instructions (Addendum)
Gaviscon 2 tablets at bedtime; try for 2 weeks and if it does not work can stop. Can take Tylenol up to 2 g per day or Advil(200 mg) one to 2 tablets 3 times a day with meals as needed. Can alternate use of Advil and tramadol. Notify if you have another episode of epigastric pain like he had last month. Decreased Nexium dose to once daily after 2 months.

## 2012-02-06 NOTE — Progress Notes (Signed)
Presenting complaint; Followup for peptic ulcer disease and GERD. Wants to discuss treatment options for knee arthritis since not taking meloxicam. Subjective: Patient is 66 year old Caucasian female who is here for scheduled visit. In October 20 12th she had abdominal pelvic CT revealing large hiatal hernia and dilated stomach and proximal duodenum. She then had gastric emptying study which was abnormal. She therefore underwent EGD on 12/08/2011 revealing moderate size hiatal hernia without evidence of Barrett's or esophageal erosions. She had multiple gastric erosions and 2 small prepyloric ulcers. H. pylori serology was negative. meloxicam was discontinued and Nexium was doubled. Patient states that she is doing great from GI standpoint. She rarely experiences heartburn or regurgitation. She does feel broader dry in her throat when she wakes up but after she drinks this symptom goes away. She is sleeping on a wedge. She had single episode of epigastric pain on 12/30/2011 and wonders if it was due to decided that she had eaten leftover from day before. She has good appetite. By cutting portions she has managed to lose another 8 pounds. However she is having lot more knee pain and wants to review her options. She was given prescription for Ultram or tramadol by Dr. Phillips Odor but has not filled it yet. Current Medications: Current Outpatient Prescriptions  Medication Sig Dispense Refill  . aspirin 81 MG tablet Take 81 mg by mouth daily.      . Calcium-Vitamin D-Vitamin K (VIACTIV PO) Take by mouth. Patient takes 1 chewable a day      . Cholecalciferol (VITAMIN D) 2000 UNITS CAPS Take 1 capsule by mouth daily.        . diazepam (VALIUM) 5 MG tablet Take 2.5 mg by mouth at bedtime.       Marland Kitchen esomeprazole (NEXIUM) 40 MG capsule Take 1 capsule (40 mg total) by mouth 2 (two) times daily before a meal.  60 capsule  2  . gabapentin (NEURONTIN) 300 MG capsule Take 300 mg by mouth at bedtime.        .  hydrochlorothiazide (MICROZIDE) 12.5 MG capsule Take 12.5 mg by mouth daily.      Marland Kitchen HYDROcodone-acetaminophen (VICODIN) 5-500 MG per tablet Take 1 tablet by mouth every 6 (six) hours as needed. Pain      . hyoscyamine (LEVSIN SL) 0.125 MG SL tablet Place 0.125 mg under the tongue every 4 (four) hours as needed.      . montelukast (SINGULAIR) 10 MG tablet Take 10 mg by mouth every morning.       . Multiple Vitamin (MULTIVITAMIN) tablet Take 1 tablet by mouth daily.      . Probiotic Product (PHILLIPS COLON HEALTH) CAPS Take by mouth. 1 capsule daily        Objective: Blood pressure 128/70, pulse 78, temperature 97.9 F (36.6 C), temperature source Oral, resp. rate 14, height 5\' 2"  (1.575 m), weight 197 lb 12.8 oz (89.721 kg).  Conjunctiva is pink. Sclera is nonicteric Oropharyngeal mucosa is normal. No neck masses or thyromegaly noted. Cardiac exam with regular rhythm normal S1 and S2. No murmur or gallop noted. Lungs are clear to auscultation. Abdomen is full but soft and nontender without organomegaly or masses.  No LE edema or clubbing noted.  Assessment: #1. Peptic ulcer disease. She had 2 small ulcers on EGD of 8 weeks ago felt to be secondary to NSAID therapy. Her H. pylori serology was negative. Unless she has another episode of epigastric pain do not feel followup EGD is needed. Will decrease her  Nexium does after another 8 weeks. #2. Chronic GERD. She has moderate size hiatal hernia but no evidence of complicated GERD. Therefore we'll continue with anti-reflux therapy as long as it is  working. Weight reduction as she is doing would also help a great deal. She may be having nocturnal reflux and will try her on bedtime Gaviscon. #3. Knee arthritis. Patient can take Ultram or tramadol. She can also take Tylenol but no more than 2 g per day and if these therapies don't work she can take Advil OTC 200-400 mg up to 3 times a day as needed.   Plan: Decrease Nexium dose to 40 mg by mouth  every morning after 2 months. Gaviscon 2 tablets at bedtime for 2 weeks; if it works can use it on an as-needed basis. Can take Advil 200-400 mg up to 3 times a day with meals on as-needed basis or Tylenol up to 2 g per day. Patient will call the office if she has another episode of epigastric pain initially this would consider EGD. Office visit in 6 months.

## 2012-02-20 ENCOUNTER — Other Ambulatory Visit (INDEPENDENT_AMBULATORY_CARE_PROVIDER_SITE_OTHER): Payer: Self-pay | Admitting: Internal Medicine

## 2012-02-20 ENCOUNTER — Telehealth (INDEPENDENT_AMBULATORY_CARE_PROVIDER_SITE_OTHER): Payer: Self-pay | Admitting: *Deleted

## 2012-02-20 DIAGNOSIS — R1013 Epigastric pain: Secondary | ICD-10-CM

## 2012-02-20 NOTE — Telephone Encounter (Signed)
Patient left a message on phone that she had another epigastric episode today, and Dr. Karilyn Cota had told her that when she had another on to call our office. She may be reached at [phone]. Per Dr. Karilyn Cota have the patient get LFT's if these are normal then we will post the patient for a EGD. Lab order faxed to [facility] may aware of possible EGD being needed Patient called and made aware.

## 2012-02-20 NOTE — Telephone Encounter (Signed)
Per NUR do LFT's

## 2012-02-21 LAB — HEPATIC FUNCTION PANEL
Alkaline Phosphatase: 92 U/L (ref 39–117)
Bilirubin, Direct: 0.1 mg/dL (ref 0.0–0.3)
Indirect Bilirubin: 0.2 mg/dL (ref 0.0–0.9)
Total Bilirubin: 0.3 mg/dL (ref 0.3–1.2)

## 2012-02-21 NOTE — Telephone Encounter (Signed)
Will sch'd EGD once Dr Karilyn Cota lets me know for sure to schd

## 2012-05-16 ENCOUNTER — Other Ambulatory Visit (HOSPITAL_COMMUNITY): Payer: Self-pay | Admitting: Pulmonary Disease

## 2012-05-16 DIAGNOSIS — R918 Other nonspecific abnormal finding of lung field: Secondary | ICD-10-CM

## 2012-05-17 ENCOUNTER — Ambulatory Visit (HOSPITAL_COMMUNITY)
Admission: RE | Admit: 2012-05-17 | Discharge: 2012-05-17 | Disposition: A | Discharge status: Home / Self Care | Payer: Medicare Other | Source: Ambulatory Visit | Attending: Pulmonary Disease | Admitting: Pulmonary Disease

## 2012-05-17 ENCOUNTER — Encounter (HOSPITAL_COMMUNITY): Payer: Self-pay

## 2012-05-17 DIAGNOSIS — R918 Other nonspecific abnormal finding of lung field: Secondary | ICD-10-CM

## 2012-05-17 DIAGNOSIS — Z09 Encounter for follow-up examination after completed treatment for conditions other than malignant neoplasm: Secondary | ICD-10-CM | POA: Insufficient documentation

## 2012-05-17 DIAGNOSIS — J984 Other disorders of lung: Secondary | ICD-10-CM | POA: Insufficient documentation

## 2012-05-24 ENCOUNTER — Ambulatory Visit (HOSPITAL_COMMUNITY)
Admission: RE | Admit: 2012-05-24 | Discharge: 2012-05-24 | Disposition: A | Discharge status: Home / Self Care | Payer: 59 | Source: Ambulatory Visit | Attending: Family Medicine | Admitting: Family Medicine

## 2012-05-24 ENCOUNTER — Other Ambulatory Visit (HOSPITAL_COMMUNITY): Payer: Self-pay | Admitting: Family Medicine

## 2012-05-24 DIAGNOSIS — Z01818 Encounter for other preprocedural examination: Secondary | ICD-10-CM

## 2012-05-24 DIAGNOSIS — Z139 Encounter for screening, unspecified: Secondary | ICD-10-CM

## 2012-05-24 DIAGNOSIS — R079 Chest pain, unspecified: Secondary | ICD-10-CM

## 2012-05-28 ENCOUNTER — Encounter: Payer: Self-pay | Admitting: Cardiology

## 2012-05-29 ENCOUNTER — Ambulatory Visit: Payer: 59 | Admitting: Adult Health

## 2012-05-30 ENCOUNTER — Other Ambulatory Visit (HOSPITAL_COMMUNITY): Payer: 59

## 2012-06-01 ENCOUNTER — Ambulatory Visit (HOSPITAL_COMMUNITY)
Admission: RE | Admit: 2012-06-01 | Discharge: 2012-06-01 | Disposition: A | Discharge status: Home / Self Care | Payer: 59 | Source: Ambulatory Visit | Attending: Family Medicine | Admitting: Family Medicine

## 2012-06-01 ENCOUNTER — Encounter: Payer: Self-pay | Admitting: Adult Health

## 2012-06-01 ENCOUNTER — Ambulatory Visit (INDEPENDENT_AMBULATORY_CARE_PROVIDER_SITE_OTHER): Payer: 59 | Admitting: Adult Health

## 2012-06-01 VITALS — BP 126/78 | HR 76 | Ht 63.0 in | Wt 198.0 lb

## 2012-06-01 DIAGNOSIS — M818 Other osteoporosis without current pathological fracture: Secondary | ICD-10-CM | POA: Insufficient documentation

## 2012-06-01 DIAGNOSIS — I10 Essential (primary) hypertension: Secondary | ICD-10-CM

## 2012-06-01 DIAGNOSIS — Z87898 Personal history of other specified conditions: Secondary | ICD-10-CM

## 2012-06-01 DIAGNOSIS — Z780 Asymptomatic menopausal state: Secondary | ICD-10-CM | POA: Insufficient documentation

## 2012-06-01 DIAGNOSIS — Z139 Encounter for screening, unspecified: Secondary | ICD-10-CM

## 2012-06-01 DIAGNOSIS — R011 Cardiac murmur, unspecified: Secondary | ICD-10-CM

## 2012-06-01 NOTE — Progress Notes (Signed)
HPI: [name] is a pleasant 66 y/o patient of Dr. Daleen Squibb we are seeing of pre-surgical clearance for left knee replacement. She has no documented underlying heart disease, but is followed for hypertension and mild Mitral valve insufficiency. Most recent echocardiogram completed 11/2010 demonstrated:        Structurally normal valve. Mobility was not      restricted. Doppler: Transvalvular velocity was within the      normal range. There was no evidence for stenosis. Trivial      regurgitation. Peak gradient: 4mm Hg (D).  She comes today without cardiac complaints. She has frequent GERD symptoms and chest discomfort related to sliding hiatal hernia, usually occuring when she eats too much or lies down. She walks, despite knee pain, and is not short of breath or complains of chest pain.      Allergies  Allergen Reactions  . Cephalexin   . Darvocet (Propoxyphene-Acetaminophen)   . Percocet (Oxycodone-Acetaminophen) Nausea Only  . Penicillins Rash and Other (See Comments)    Redness    Current Outpatient Prescriptions  Medication Sig Dispense Refill  . acetaminophen (TYLENOL) 500 MG tablet Take 500 mg by mouth every 6 (six) hours as needed.      Marland Kitchen aspirin 81 MG tablet Take 81 mg by mouth daily.      . Calcium-Vitamin D-Vitamin K (VIACTIV PO) Take by mouth. Patient takes 1 chewable a day      . Cholecalciferol (VITAMIN D) 2000 UNITS CAPS Take 1 capsule by mouth daily.        . diazepam (VALIUM) 5 MG tablet Take 2.5 mg by mouth at bedtime.       Marland Kitchen esomeprazole (NEXIUM) 40 MG capsule Take 40 mg by mouth daily before breakfast.      . gabapentin (NEURONTIN) 300 MG capsule Take 300 mg by mouth at bedtime.        . hydrochlorothiazide (MICROZIDE) 12.5 MG capsule Take 12.5 mg by mouth daily.      Marland Kitchen HYDROcodone-acetaminophen (VICODIN) 5-500 MG per tablet Take 1 tablet by mouth every 6 (six) hours as needed. Pain      . ibuprofen (ADVIL,MOTRIN) 200 MG tablet Take 200 mg by mouth every 6 (six)  hours as needed.      . montelukast (SINGULAIR) 10 MG tablet Take 10 mg by mouth every morning.       . Multiple Vitamin (MULTIVITAMIN) tablet Take 1 tablet by mouth daily.      Marland Kitchen OVER THE COUNTER MEDICATION Extra strength acidophilus Probiotic Gold  1 tab daily      . traMADol (ULTRAM) 50 MG tablet Take 50 mg by mouth every 6 (six) hours as needed.      Marland Kitchen DISCONTD: esomeprazole (NEXIUM) 40 MG capsule Take 1 capsule (40 mg total) by mouth 2 (two) times daily before a meal.  60 capsule  2  . hyoscyamine (LEVSIN/SL) 0.125 MG SL tablet Place 1 tablet (0.125 mg total) under the tongue every 4 (four) hours as needed for cramping.  90 tablet  1  . DISCONTD: hyoscyamine (LEVSIN SL) 0.125 MG SL tablet Place 0.125 mg under the tongue every 4 (four) hours as needed.        Past Medical History  Diagnosis Date  . Irregular heart beat   . Murmur     since birth  . Asthma   . GERD (gastroesophageal reflux disease)   . Arthritis     Past Surgical History  Procedure Date  . Appendectomy   .  Cholecystectomy   . Neck surgery     x 2   . Knee arthroscopy     Left  . Esophagogastroduodenoscopy 12/08/2011    Procedure: ESOPHAGOGASTRODUODENOSCOPY (EGD);  Surgeon: Malissa Hippo, MD;  Location: [facility];  Service: Endoscopy;  Laterality: N/A;  3:00   . Colonoscopy   . Upper gastrointestinal endoscopy     GEX:BMWUXL of systems complete and found to be negative unless listed above  PHYSICAL EXAM BP 126/78  Pulse 76  Ht 5\' 3"  (1.6 m)  Wt 198 lb (89.812 kg)  BMI 35.07 kg/m2  General: Well developed, well nourished, in no acute distress Head: Eyes PERRLA, No xanthomas.   Normal cephalic and atramatic  Lungs: Clear bilaterally to auscultation and percussion. Heart: HRRR S1 S2 1/6 systolic murmur heard best at the LSB and apex..  Pulses are 2+ & equal.            No carotid bruit. No JVD.  No abdominal bruits. No femoral bruits. Abdomen: Bowel sounds are positive, abdomen soft and non-tender  without masses or                  Hernia's noted. Msk:  Back normal, normal gait. Normal strength and tone for age. Extremities: No clubbing, cyanosis or edema.  DP +1 Neuro: Alert and oriented X 3. Psych:  Good affect, responds appropriately  EKG: Normal sinus rhythm rate of  76 bpm.  ASSESSMENT AND PLAN

## 2012-06-01 NOTE — Assessment & Plan Note (Signed)
Heart murmur is unchanged. She is asymptomatic from cardiac standpoint. She is of low and acceptable risk for left knee replacement. No medications need to be modified or held perioperatively at this time. We will be happy to see her during post surgical recovery if necessary. Proceed with surgery without further cardiac testing.

## 2012-06-01 NOTE — Patient Instructions (Signed)
**Note De-identified Katrinka Herbison Obfuscation** Your physician recommends that you continue on your current medications as directed. Please refer to the Current Medication list given to you today.  Your physician recommends that you schedule a follow-up appointment in: October  

## 2012-06-01 NOTE — Assessment & Plan Note (Signed)
Currently very well controlled. On HCTZ only. She has had recent labs this week and were found to be WNL.

## 2012-06-27 ENCOUNTER — Encounter (HOSPITAL_COMMUNITY): Payer: Self-pay | Admitting: Pharmacy Technician

## 2012-07-05 MED ORDER — BACITRACIN 50000 UNITS IM SOLR
INTRAMUSCULAR | Status: AC
  Filled 2012-07-05: qty 1

## 2012-07-05 MED ORDER — SODIUM CHLORIDE 0.9 % IV SOLN
INTRAVENOUS | Status: AC
  Filled 2012-07-05: qty 500

## 2012-07-06 ENCOUNTER — Encounter (HOSPITAL_COMMUNITY): Payer: Self-pay

## 2012-07-06 ENCOUNTER — Encounter (HOSPITAL_COMMUNITY)
Admission: RE | Admit: 2012-07-06 | Discharge: 2012-07-06 | Disposition: A | Discharge status: Home / Self Care | Payer: 59 | Source: Ambulatory Visit | Attending: Orthopedic Surgery | Admitting: Orthopedic Surgery

## 2012-07-06 HISTORY — DX: Carpal tunnel syndrome, unspecified upper limb: G56.00

## 2012-07-06 HISTORY — DX: Personal history of other diseases of the digestive system: Z87.19

## 2012-07-06 HISTORY — DX: Myoneural disorder, unspecified: G70.9

## 2012-07-06 LAB — URINALYSIS, ROUTINE W REFLEX MICROSCOPIC
Bilirubin Urine: NEGATIVE
Nitrite: NEGATIVE
Specific Gravity, Urine: 1.017 (ref 1.005–1.030)
Urobilinogen, UA: 0.2 mg/dL (ref 0.0–1.0)
pH: 7 (ref 5.0–8.0)

## 2012-07-06 LAB — CBC
HCT: 36.5 % (ref 36.0–46.0)
Hemoglobin: 11.2 g/dL — ABNORMAL LOW (ref 12.0–15.0)
RBC: 4.52 MIL/uL (ref 3.87–5.11)
WBC: 6.8 10*3/uL (ref 4.0–10.5)

## 2012-07-06 LAB — PROTIME-INR: Prothrombin Time: 13.9 seconds (ref 11.6–15.2)

## 2012-07-06 LAB — COMPREHENSIVE METABOLIC PANEL
ALT: 23 U/L (ref 0–35)
Alkaline Phosphatase: 109 U/L (ref 39–117)
BUN: 16 mg/dL (ref 6–23)
CO2: 27 mEq/L (ref 19–32)
Chloride: 105 mEq/L (ref 96–112)
GFR calc Af Amer: 78 mL/min — ABNORMAL LOW (ref >90)
Glucose, Bld: 106 mg/dL — ABNORMAL HIGH (ref 70–99)
Potassium: 3.8 mEq/L (ref 3.5–5.1)
Sodium: 142 mEq/L (ref 135–145)
Total Bilirubin: 0.3 mg/dL (ref 0.3–1.2)

## 2012-07-06 LAB — URINE MICROSCOPIC-ADD ON

## 2012-07-06 LAB — SURGICAL PCR SCREEN
MRSA, PCR: NEGATIVE
Staphylococcus aureus: NEGATIVE

## 2012-07-06 LAB — TYPE AND SCREEN: Antibody Screen: NEGATIVE

## 2012-07-06 LAB — APTT: aPTT: 32 seconds (ref 24–37)

## 2012-07-06 NOTE — Pre-Procedure Instructions (Signed)
[name]  07/06/2012   Your procedure is scheduled on:  Wednesday, July 17th.  Report to [facility] at 7:30AM.  Call this number if you have problems the morning of surgery: [phone]   Remember:   Do not eat food or drink any liquid:After Midnight.    Take these medicines the morning of surgery with A SIP OF WATER: Esomeprazole (Nexium), Montelukast (Singulair).  May take Acetaminophen (Tylenol) or Tramadol (Ultram0 if needed.   Do not wear jewelry, make-up or nail polish.  Do not wear lotions, powders, or perfumes. You may wear deodorant.  Do not shave 48 hours prior to surgery. Men may shave face and neck.  Do not bring valuables to the hospital.  Contacts, dentures or bridgework may not be worn into surgery.  Leave suitcase in the car. After surgery it may be brought to your room.  For patients admitted to the hospital, checkout time is 11:00 AM the day of discharge.   Patients discharged the day of surgery will not be allowed to drive home.  Name and phone number of your driver:NA  Special Instructions: CHG Shower Use Special Wash: 1/2 bottle night before surgery and 1/2 bottle morning of surgery.   Please read over the following fact sheets that you were given: Pain Booklet, Coughing and Deep Breathing, Blood Transfusion Information, Total Joint Packet and Surgical Site Infection Prevention

## 2012-07-10 MED ORDER — VANCOMYCIN HCL IN DEXTROSE 1-5 GM/200ML-% IV SOLN
1000.0000 mg | INTRAVENOUS | Status: AC
  Administered 2012-07-11: 1000 mg via INTRAVENOUS
  Filled 2012-07-10: qty 200

## 2012-07-10 NOTE — H&P (Signed)
  [facility] [address]   [address] [place], [place] [address] [phone] A Division of [facility]  Loreta Ave, M.D.     Robert A. Thurston Hole, M.D.     Lunette Stands, M.D. Eulas Post, M.D.    Buford Dresser, M.D. Estell Harpin, M.D. Ralene Cork, D.O.          Genene Churn. Barry Dienes, PA-C            Kirstin A. Shepperson, PA-C [place], OPA-C   RE: [place], [place]                                [phone]      DOB: [birth year] PROGRESS NOTE: 07-06-12 Chief complaint: Left knee pain.  History of present illness: 72 five year-old white female with a history of end stage DJD, left knee, and chronic pain.  Returns.  States that knee symptoms are unchanged from previous visit.  She is wanting to proceed with left total knee replacement as scheduled.  Received all appropriate pre-op clearances.  Primary care physician is Dr. Phillips Odor in [place].   Current medications: Nexium, Singulair, HCTZ, Gabapentin, Fosamax, Tramadol, Diazepam, Vitamin D3, Probiotic, Aspirin, Citracal and Ferrous Sulfate. Allergies: Penicillin, Percocet and Darvocet. Past medical/surgical history: Tonsillectomy, cholecystectomy, disc surgery, appendectomy, hysteroscopy, GERD, hiatal hernia and osteoporosis.  Review of systems: Patient currently denies lightheadedness, dizziness, fevers or chills, cardiac or GI issues.  Admits to occasional dyspnea and has been evaluated by a cardiologist for this.    Family history: Positive for kidney disease, hypertension and diabetes.   Social history: Patient denies smoking or alcohol use.  She is married and currently retired.      EXAMINATION: Height: 5?3.  Weight: 196 pounds.  Blood pressure: 150/78.  Pulse: 98.  Respirations: 20.  Temperature: 98.4.  Pleasant white female, alert and oriented x 3 and in no acute distress.  No increase in respiratory effort.  Cervical spine unremarkable.  Lungs: CTA  bilaterally.  No wheezes noted.  Heart: RRR.  S1 and S2.  Grade I-II systolic murmur.  Abdomen: Round and non-distended.  NBS x 4.  Soft and non-tender.  Left knee: Decreased range of motion.  Positive crepitus.  Joint line tender.  Positive effusion.  Ligaments stable.  Calf non-tender.  Neurovascularly intact.  Skin warm and dry.   IMPRESSION: End stage DJD, left knee, and chronic pain.  Failed conservative treatment.  PLAN: We will proceed with left total knee replacement as scheduled.  All questions answered.  Loreta Ave, M.D.   Electronically verified by Loreta Ave, M.D. DFM(JMO):jjh D 07-06-12 T 07-09-12

## 2012-07-10 NOTE — Progress Notes (Signed)
Voicemail left requesting patient to arrive at short stay at 0700 instead of 0730.

## 2012-07-11 ENCOUNTER — Inpatient Hospital Stay (HOSPITAL_COMMUNITY)
Admission: RE | Admit: 2012-07-11 | Discharge: 2012-07-13 | DRG: 470 | Disposition: A | Discharge status: Home / Self Care | Payer: 59 | Source: Ambulatory Visit | Attending: Orthopedic Surgery | Admitting: Orthopedic Surgery

## 2012-07-11 ENCOUNTER — Inpatient Hospital Stay (HOSPITAL_COMMUNITY): Payer: 59

## 2012-07-11 ENCOUNTER — Ambulatory Visit (HOSPITAL_COMMUNITY): Payer: 59 | Admitting: Anesthesiology

## 2012-07-11 ENCOUNTER — Encounter (HOSPITAL_COMMUNITY): Payer: Self-pay | Admitting: Anesthesiology

## 2012-07-11 ENCOUNTER — Encounter (HOSPITAL_COMMUNITY)
Admission: RE | Disposition: A | Discharge status: Home / Self Care | Payer: Self-pay | Source: Ambulatory Visit | Attending: Orthopedic Surgery

## 2012-07-11 ENCOUNTER — Encounter (HOSPITAL_COMMUNITY): Payer: Self-pay | Admitting: *Deleted

## 2012-07-11 DIAGNOSIS — K449 Diaphragmatic hernia without obstruction or gangrene: Secondary | ICD-10-CM | POA: Diagnosis present

## 2012-07-11 DIAGNOSIS — J45909 Unspecified asthma, uncomplicated: Secondary | ICD-10-CM | POA: Diagnosis present

## 2012-07-11 DIAGNOSIS — M810 Age-related osteoporosis without current pathological fracture: Secondary | ICD-10-CM | POA: Diagnosis present

## 2012-07-11 DIAGNOSIS — M1710 Unilateral primary osteoarthritis, unspecified knee: Principal | ICD-10-CM | POA: Diagnosis present

## 2012-07-11 DIAGNOSIS — K219 Gastro-esophageal reflux disease without esophagitis: Secondary | ICD-10-CM | POA: Diagnosis present

## 2012-07-11 DIAGNOSIS — Z01812 Encounter for preprocedural laboratory examination: Secondary | ICD-10-CM

## 2012-07-11 DIAGNOSIS — Z860100 Personal history of colon polyps, unspecified: Secondary | ICD-10-CM

## 2012-07-11 DIAGNOSIS — E669 Obesity, unspecified: Secondary | ICD-10-CM | POA: Diagnosis present

## 2012-07-11 DIAGNOSIS — I10 Essential (primary) hypertension: Secondary | ICD-10-CM | POA: Diagnosis present

## 2012-07-11 DIAGNOSIS — Z471 Aftercare following joint replacement surgery: Secondary | ICD-10-CM

## 2012-07-11 HISTORY — PX: TOTAL KNEE ARTHROPLASTY: SHX125

## 2012-07-11 SURGERY — ARTHROPLASTY, KNEE, TOTAL
Anesthesia: General | Site: Knee | Laterality: Left | Wound class: Clean

## 2012-07-11 MED ORDER — ONDANSETRON HCL 4 MG/2ML IJ SOLN
INTRAMUSCULAR | Status: DC | PRN
  Administered 2012-07-11: 4 mg via INTRAVENOUS

## 2012-07-11 MED ORDER — HYDROMORPHONE HCL PF 1 MG/ML IJ SOLN
INTRAMUSCULAR | Status: AC
  Administered 2012-07-11: 0.5 mg via INTRAVENOUS
  Filled 2012-07-11: qty 1

## 2012-07-11 MED ORDER — ONDANSETRON HCL 4 MG/2ML IJ SOLN: 4.0000 mg | Freq: Four times a day (QID) | INTRAMUSCULAR | Status: DC | PRN

## 2012-07-11 MED ORDER — ACETAMINOPHEN 10 MG/ML IV SOLN
INTRAVENOUS | Status: AC
  Filled 2012-07-11: qty 100

## 2012-07-11 MED ORDER — ROCURONIUM BROMIDE 100 MG/10ML IV SOLN
INTRAVENOUS | Status: DC | PRN
  Administered 2012-07-11: 30 mg via INTRAVENOUS

## 2012-07-11 MED ORDER — POTASSIUM CHLORIDE IN NACL 20-0.9 MEQ/L-% IV SOLN
INTRAVENOUS | Status: AC
  Administered 2012-07-11: 13:00:00 via INTRAVENOUS
  Filled 2012-07-11 (×2): qty 1000

## 2012-07-11 MED ORDER — LIDOCAINE HCL (CARDIAC) 20 MG/ML IV SOLN
INTRAVENOUS | Status: DC | PRN
  Administered 2012-07-11: 40 mg via INTRAVENOUS

## 2012-07-11 MED ORDER — MIDAZOLAM HCL 5 MG/5ML IJ SOLN
INTRAMUSCULAR | Status: DC | PRN
  Administered 2012-07-11: 1 mg via INTRAVENOUS

## 2012-07-11 MED ORDER — SUCCINYLCHOLINE CHLORIDE 20 MG/ML IJ SOLN
INTRAMUSCULAR | Status: DC | PRN
  Administered 2012-07-11: 100 mg via INTRAVENOUS

## 2012-07-11 MED ORDER — ONDANSETRON HCL 4 MG PO TABS: 4.0000 mg | ORAL_TABLET | Freq: Four times a day (QID) | ORAL | Status: DC | PRN

## 2012-07-11 MED ORDER — METOCLOPRAMIDE HCL 5 MG/ML IJ SOLN: 5.0000 mg | Freq: Three times a day (TID) | INTRAMUSCULAR | Status: DC | PRN

## 2012-07-11 MED ORDER — METHOCARBAMOL 500 MG PO TABS
500.0000 mg | ORAL_TABLET | Freq: Four times a day (QID) | ORAL | Status: DC | PRN
  Administered 2012-07-11 – 2012-07-12 (×2): 500 mg via ORAL
  Filled 2012-07-11 (×3): qty 1

## 2012-07-11 MED ORDER — HYDROCHLOROTHIAZIDE 12.5 MG PO CAPS
12.5000 mg | ORAL_CAPSULE | Freq: Every day | ORAL | Status: DC
  Administered 2012-07-12 – 2012-07-13 (×2): 12.5 mg via ORAL
  Filled 2012-07-11 (×3): qty 1

## 2012-07-11 MED ORDER — MORPHINE SULFATE 4 MG/ML IJ SOLN
INTRAMUSCULAR | Status: AC
  Filled 2012-07-11: qty 1

## 2012-07-11 MED ORDER — PROPOFOL 10 MG/ML IV EMUL
INTRAVENOUS | Status: DC | PRN
  Administered 2012-07-11: 140 mg via INTRAVENOUS

## 2012-07-11 MED ORDER — VANCOMYCIN HCL IN DEXTROSE 1-5 GM/200ML-% IV SOLN
1000.0000 mg | Freq: Two times a day (BID) | INTRAVENOUS | Status: AC
  Administered 2012-07-12 (×2): 1000 mg via INTRAVENOUS
  Filled 2012-07-11 (×3): qty 200

## 2012-07-11 MED ORDER — BUPIVACAINE HCL (PF) 0.25 % IJ SOLN
INTRAMUSCULAR | Status: DC | PRN
  Administered 2012-07-11: 30 mL via INTRA_ARTICULAR

## 2012-07-11 MED ORDER — GABAPENTIN 300 MG PO CAPS
300.0000 mg | ORAL_CAPSULE | Freq: Every day | ORAL | Status: DC
  Administered 2012-07-11 – 2012-07-12 (×2): 300 mg via ORAL
  Filled 2012-07-11 (×4): qty 1

## 2012-07-11 MED ORDER — VITAMIN D3 25 MCG (1000 UNIT) PO TABS
2000.0000 [IU] | ORAL_TABLET | Freq: Every day | ORAL | Status: DC
  Administered 2012-07-12 – 2012-07-13 (×2): 2000 [IU] via ORAL
  Filled 2012-07-11 (×3): qty 2

## 2012-07-11 MED ORDER — METHOCARBAMOL 100 MG/ML IJ SOLN
500.0000 mg | Freq: Four times a day (QID) | INTRAVENOUS | Status: DC | PRN
  Administered 2012-07-11: 500 mg via INTRAVENOUS
  Filled 2012-07-11: qty 5

## 2012-07-11 MED ORDER — HYDROMORPHONE HCL PF 1 MG/ML IJ SOLN
0.2500 mg | INTRAMUSCULAR | Status: DC | PRN
  Administered 2012-07-11: 0.5 mg via INTRAVENOUS

## 2012-07-11 MED ORDER — PHENOL 1.4 % MT LIQD: 1.0000 | OROMUCOSAL | Status: DC | PRN

## 2012-07-11 MED ORDER — WARFARIN SODIUM 7.5 MG PO TABS
7.5000 mg | ORAL_TABLET | Freq: Once | ORAL | Status: AC
Start: 2012-07-11 — End: 2012-07-12
  Filled 2012-07-11: qty 1

## 2012-07-11 MED ORDER — DOCUSATE SODIUM 100 MG PO CAPS
100.0000 mg | ORAL_CAPSULE | Freq: Two times a day (BID) | ORAL | Status: DC
  Administered 2012-07-11 – 2012-07-13 (×4): 100 mg via ORAL
  Filled 2012-07-11 (×5): qty 1

## 2012-07-11 MED ORDER — DIAZEPAM 5 MG PO TABS
2.5000 mg | ORAL_TABLET | Freq: Every day | ORAL | Status: DC
  Administered 2012-07-11 – 2012-07-12 (×2): 2.5 mg via ORAL
  Filled 2012-07-11 (×2): qty 1

## 2012-07-11 MED ORDER — GLYCOPYRROLATE 0.2 MG/ML IJ SOLN
INTRAMUSCULAR | Status: DC | PRN
  Administered 2012-07-11: 0.4 mg via INTRAVENOUS

## 2012-07-11 MED ORDER — HYDROMORPHONE HCL PF 1 MG/ML IJ SOLN
1.0000 mg | INTRAMUSCULAR | Status: DC | PRN
  Administered 2012-07-11: 1 mg via INTRAVENOUS
  Filled 2012-07-11: qty 1

## 2012-07-11 MED ORDER — WARFARIN VIDEO
Freq: Once | Status: AC
  Administered 2012-07-12: 10:00:00

## 2012-07-11 MED ORDER — POTASSIUM CHLORIDE IN NACL 20-0.9 MEQ/L-% IV SOLN
INTRAVENOUS | Status: AC
  Administered 2012-07-11: 14:00:00 via INTRAVENOUS
  Filled 2012-07-11 (×2): qty 1000

## 2012-07-11 MED ORDER — BISACODYL 10 MG RE SUPP: 10.0000 mg | Freq: Every day | RECTAL | Status: DC | PRN

## 2012-07-11 MED ORDER — METOCLOPRAMIDE HCL 10 MG PO TABS: 5.0000 mg | ORAL_TABLET | Freq: Three times a day (TID) | ORAL | Status: DC | PRN

## 2012-07-11 MED ORDER — LACTATED RINGERS IV SOLN
INTRAVENOUS | Status: DC | PRN
  Administered 2012-07-11 (×2): via INTRAVENOUS

## 2012-07-11 MED ORDER — BUPIVACAINE-EPINEPHRINE PF 0.5-1:200000 % IJ SOLN
INTRAMUSCULAR | Status: DC | PRN
  Administered 2012-07-11: 30 mL

## 2012-07-11 MED ORDER — NEOSTIGMINE METHYLSULFATE 1 MG/ML IJ SOLN
INTRAMUSCULAR | Status: DC | PRN
  Administered 2012-07-11: 3 mg via INTRAVENOUS

## 2012-07-11 MED ORDER — DROPERIDOL 2.5 MG/ML IJ SOLN
INTRAMUSCULAR | Status: DC | PRN
  Administered 2012-07-11: 0.625 mg via INTRAVENOUS

## 2012-07-11 MED ORDER — BUPIVACAINE HCL (PF) 0.25 % IJ SOLN
INTRAMUSCULAR | Status: AC
  Filled 2012-07-11: qty 30

## 2012-07-11 MED ORDER — FENTANYL CITRATE 0.05 MG/ML IJ SOLN
INTRAMUSCULAR | Status: DC | PRN
  Administered 2012-07-11 (×2): 50 ug via INTRAVENOUS
  Administered 2012-07-11: 100 ug via INTRAVENOUS
  Administered 2012-07-11: 50 ug via INTRAVENOUS

## 2012-07-11 MED ORDER — DEXAMETHASONE SODIUM PHOSPHATE 10 MG/ML IJ SOLN
INTRAMUSCULAR | Status: DC | PRN
  Administered 2012-07-11: 8 mg via INTRAVENOUS

## 2012-07-11 MED ORDER — MORPHINE SULFATE 4 MG/ML IJ SOLN
INTRAMUSCULAR | Status: DC | PRN
  Administered 2012-07-11: 4 mg via INTRAMUSCULAR

## 2012-07-11 MED ORDER — MENTHOL 3 MG MT LOZG: 1.0000 | LOZENGE | OROMUCOSAL | Status: DC | PRN

## 2012-07-11 MED ORDER — PANTOPRAZOLE SODIUM 40 MG PO TBEC
40.0000 mg | DELAYED_RELEASE_TABLET | Freq: Every day | ORAL | Status: DC
  Administered 2012-07-12 – 2012-07-13 (×2): 40 mg via ORAL
  Filled 2012-07-11 (×2): qty 1

## 2012-07-11 MED ORDER — VITAMIN D 50 MCG (2000 UT) PO CAPS: 1.0000 | ORAL_CAPSULE | Freq: Every day | ORAL | Status: DC

## 2012-07-11 MED ORDER — ACETAMINOPHEN 325 MG PO TABS
650.0000 mg | ORAL_TABLET | Freq: Four times a day (QID) | ORAL | Status: DC | PRN
  Administered 2012-07-12: 650 mg via ORAL
  Filled 2012-07-11: qty 2

## 2012-07-11 MED ORDER — ACETAMINOPHEN 10 MG/ML IV SOLN
INTRAVENOUS | Status: DC | PRN
  Administered 2012-07-11: 1000 mg via INTRAVENOUS

## 2012-07-11 MED ORDER — SENNOSIDES-DOCUSATE SODIUM 8.6-50 MG PO TABS: 1.0000 | ORAL_TABLET | Freq: Every evening | ORAL | Status: DC | PRN

## 2012-07-11 MED ORDER — ACETAMINOPHEN 650 MG RE SUPP: 650.0000 mg | Freq: Four times a day (QID) | RECTAL | Status: DC | PRN

## 2012-07-11 MED ORDER — WARFARIN - PHARMACIST DOSING INPATIENT: Freq: Every day | Status: DC

## 2012-07-11 MED ORDER — PATIENT'S GUIDE TO USING COUMADIN BOOK
Freq: Once | Status: DC
  Filled 2012-07-11: qty 1

## 2012-07-11 MED ORDER — ENOXAPARIN SODIUM 30 MG/0.3ML ~~LOC~~ SOLN
30.0000 mg | Freq: Two times a day (BID) | SUBCUTANEOUS | Status: DC
  Administered 2012-07-12 – 2012-07-13 (×3): 30 mg via SUBCUTANEOUS
  Filled 2012-07-11 (×5): qty 0.3

## 2012-07-11 MED ORDER — ALUM & MAG HYDROXIDE-SIMETH 200-200-20 MG/5ML PO SUSP: 30.0000 mL | ORAL | Status: DC | PRN

## 2012-07-11 MED ORDER — SODIUM CHLORIDE 0.9 % IR SOLN
Status: DC | PRN
  Administered 2012-07-11: 1000 mL

## 2012-07-11 MED ORDER — HYDROCODONE-ACETAMINOPHEN 10-325 MG PO TABS
1.0000 | ORAL_TABLET | ORAL | Status: DC | PRN
  Administered 2012-07-11 – 2012-07-13 (×4): 2 via ORAL
  Filled 2012-07-11 (×4): qty 2

## 2012-07-11 MED ORDER — MONTELUKAST SODIUM 10 MG PO TABS
10.0000 mg | ORAL_TABLET | Freq: Every morning | ORAL | Status: DC
Start: 2012-07-12 — End: 2012-07-13
  Administered 2012-07-12 – 2012-07-13 (×2): 10 mg via ORAL
  Filled 2012-07-11 (×2): qty 1

## 2012-07-11 SURGICAL SUPPLY — 58 items
BANDAGE ESMARK 6X9 LF (GAUZE/BANDAGES/DRESSINGS) ×1 IMPLANT
BASEPLATE TIBIAL SZ4 (Plate) ×1 IMPLANT
BLADE SAG 18X100X1.27 (BLADE) ×4 IMPLANT
BNDG CMPR 9X6 STRL LF SNTH (GAUZE/BANDAGES/DRESSINGS) ×1
BNDG ESMARK 6X9 LF (GAUZE/BANDAGES/DRESSINGS) ×2
BOOTCOVER CLEANROOM LRG (PROTECTIVE WEAR) ×4 IMPLANT
BOWL SMART MIX CTS (DISPOSABLE) ×2 IMPLANT
CEMENT BONE SIMPLEX SPEEDSET (Cement) ×4 IMPLANT
CLOTH BEACON ORANGE TIMEOUT ST (SAFETY) ×2 IMPLANT
COVER BACK TABLE 24X17X13 BIG (DRAPES) ×2 IMPLANT
COVER SURGICAL LIGHT HANDLE (MISCELLANEOUS) ×2 IMPLANT
CUFF TOURNIQUET SINGLE 34IN LL (TOURNIQUET CUFF) ×2 IMPLANT
DRAPE EXTREMITY T 121X128X90 (DRAPE) ×2 IMPLANT
DRAPE PROXIMA HALF (DRAPES) ×2 IMPLANT
DRAPE U-SHAPE 47X51 STRL (DRAPES) ×2 IMPLANT
DRSG PAD ABDOMINAL 8X10 ST (GAUZE/BANDAGES/DRESSINGS) ×2 IMPLANT
DURAPREP 26ML APPLICATOR (WOUND CARE) ×2 IMPLANT
ELECT CAUTERY BLADE 6.4 (BLADE) ×2 IMPLANT
ELECT REM PT RETURN 9FT ADLT (ELECTROSURGICAL) ×2
ELECTRODE REM PT RTRN 9FT ADLT (ELECTROSURGICAL) ×1 IMPLANT
EVACUATOR 1/8 PVC DRAIN (DRAIN) ×2 IMPLANT
FACESHIELD LNG OPTICON STERILE (SAFETY) ×2 IMPLANT
GAUZE XEROFORM 5X9 LF (GAUZE/BANDAGES/DRESSINGS) ×2 IMPLANT
GLOVE BIOGEL PI IND STRL 8 (GLOVE) ×1 IMPLANT
GLOVE BIOGEL PI INDICATOR 8 (GLOVE) ×1
GLOVE ORTHO TXT STRL SZ7.5 (GLOVE) ×3 IMPLANT
GOWN PREVENTION PLUS XLARGE (GOWN DISPOSABLE) ×4 IMPLANT
GOWN STRL NON-REIN LRG LVL3 (GOWN DISPOSABLE) ×2 IMPLANT
GOWN STRL REIN 2XL XLG LVL4 (GOWN DISPOSABLE) ×2 IMPLANT
HANDPIECE INTERPULSE COAX TIP (DISPOSABLE) ×2
IMMOBILIZER KNEE 22 UNIV (SOFTGOODS) ×2 IMPLANT
IMMOBILIZER KNEE 24 THIGH 36 (MISCELLANEOUS) IMPLANT
IMMOBILIZER KNEE 24 UNIV (MISCELLANEOUS)
KIT BASIN OR (CUSTOM PROCEDURE TRAY) ×2 IMPLANT
KIT ROOM TURNOVER OR (KITS) ×2 IMPLANT
MANIFOLD NEPTUNE II (INSTRUMENTS) ×2 IMPLANT
NS IRRIG 1000ML POUR BTL (IV SOLUTION) ×2 IMPLANT
PACK TOTAL JOINT (CUSTOM PROCEDURE TRAY) ×2 IMPLANT
PAD ARMBOARD 7.5X6 YLW CONV (MISCELLANEOUS) ×4 IMPLANT
PAD CAST 4YDX4 CTTN HI CHSV (CAST SUPPLIES) ×1 IMPLANT
PADDING CAST COTTON 4X4 STRL (CAST SUPPLIES) ×2
PADDING CAST COTTON 6X4 STRL (CAST SUPPLIES) ×2 IMPLANT
RUBBERBAND STERILE (MISCELLANEOUS) ×2 IMPLANT
SET HNDPC FAN SPRY TIP SCT (DISPOSABLE) ×1 IMPLANT
SPONGE GAUZE 4X4 12PLY (GAUZE/BANDAGES/DRESSINGS) ×2 IMPLANT
STAPLER VISISTAT 35W (STAPLE) ×2 IMPLANT
STEM CEMENTED (Stem) ×1 IMPLANT
SUCTION FRAZIER TIP 10 FR DISP (SUCTIONS) ×2 IMPLANT
SUT VIC AB 1 CTX 36 (SUTURE) ×4
SUT VIC AB 1 CTX36XBRD ANBCTR (SUTURE) ×2 IMPLANT
SUT VIC AB 2-0 CT1 27 (SUTURE) ×6
SUT VIC AB 2-0 CT1 TAPERPNT 27 (SUTURE) ×2 IMPLANT
SYR 30ML LL (SYRINGE) ×2 IMPLANT
SYR 30ML SLIP (SYRINGE) ×2 IMPLANT
TOWEL OR 17X24 6PK STRL BLUE (TOWEL DISPOSABLE) ×2 IMPLANT
TOWEL OR 17X26 10 PK STRL BLUE (TOWEL DISPOSABLE) ×2 IMPLANT
TRAY FOLEY CATH 14FR (SET/KITS/TRAYS/PACK) ×2 IMPLANT
WATER STERILE IRR 1000ML POUR (IV SOLUTION) ×6 IMPLANT

## 2012-07-11 NOTE — Anesthesia Preprocedure Evaluation (Signed)
Anesthesia Evaluation  Patient identified by MRN, date of birth, ID band Patient awake    Reviewed: Allergy & Precautions, H&P , NPO status , Patient's Chart, lab work & pertinent test results  Airway Mallampati: III TM Distance: >3 FB Neck ROM: Full    Dental No notable dental hx. (+) Teeth Intact and Dental Advisory Given   Pulmonary asthma ,  breath sounds clear to auscultation  Pulmonary exam normal       Cardiovascular hypertension, On Medications + angina Rhythm:Regular Rate:Normal     Neuro/Psych negative neurological ROS  negative psych ROS   GI/Hepatic Neg liver ROS, hiatal hernia, PUD, GERD-  Medicated and Poorly Controlled,  Endo/Other  negative endocrine ROS  Renal/GU negative Renal ROS  negative genitourinary   Musculoskeletal   Abdominal (+) + obese,   Peds  Hematology negative hematology ROS (+)   Anesthesia Other Findings   Reproductive/Obstetrics negative OB ROS                           Anesthesia Physical Anesthesia Plan  ASA: III  Anesthesia Plan: General   Post-op Pain Management:    Induction: Intravenous  Airway Management Planned: Oral ETT  Additional Equipment:   Intra-op Plan:   Post-operative Plan: Extubation in OR  Informed Consent: I have reviewed the patients History and Physical, chart, labs and discussed the procedure including the risks, benefits and alternatives for the proposed anesthesia with the patient or authorized representative who has indicated his/her understanding and acceptance.   Dental advisory given  Plan Discussed with: CRNA  Anesthesia Plan Comments:         Anesthesia Quick Evaluation

## 2012-07-11 NOTE — Preoperative (Signed)
Beta Blockers   Reason not to administer Beta Blockers:Not Applicable 

## 2012-07-11 NOTE — Progress Notes (Signed)
Orthopedic Tech Progress Note Patient Details:  [name] 08-Jun-1946 [id number]  CPM Left Knee CPM Left Knee: On Left Knee Flexion (Degrees): 60  Left Knee Extension (Degrees): 0  Additional Comments: trapeze bar   Shawnie Pons 07/11/2012, 1:04 PM

## 2012-07-11 NOTE — Interval H&P Note (Signed)
History and Physical Interval Note:  07/11/2012 8:09 AM  [name]  has presented today for surgery, with the diagnosis of DJD LEFT KNEE  The various methods of treatment have been discussed with the patient and family. After consideration of risks, benefits and other options for treatment, the patient has consented to  Procedure(s) (LRB): TOTAL KNEE ARTHROPLASTY (Left) as a surgical intervention .  The patient's history has been reviewed, patient examined, no change in status, stable for surgery.  I have reviewed the patients' chart and labs.  Questions were answered to the patient's satisfaction.     MURPHY,DANIEL F

## 2012-07-11 NOTE — Anesthesia Postprocedure Evaluation (Signed)
  Anesthesia Post-op Note  Patient: [name]  Procedure(s) Performed: Procedure(s) (LRB): TOTAL KNEE ARTHROPLASTY (Left)  Patient Location: PACU  Anesthesia Type: GA combined with regional for post-op pain  Level of Consciousness: awake  Airway and Oxygen Therapy: Patient Spontanous Breathing  Post-op Pain: none  Post-op Assessment: Post-op Vital signs reviewed, Patient's Cardiovascular Status Stable, Respiratory Function Stable, Patent Airway and No signs of Nausea or vomiting  Post-op Vital Signs: Reviewed and stable  Complications: No apparent anesthesia complications

## 2012-07-11 NOTE — Brief Op Note (Signed)
07/11/2012  12:51 PM  PATIENT:  [name]  66 y.o. female  PRE-OPERATIVE DIAGNOSIS:  DJD LEFT KNEE  POST-OPERATIVE DIAGNOSIS:  DJD LEFT KNEE  PROCEDURE:  Procedure(s) (LRB): TOTAL KNEE ARTHROPLASTY (Left)  SURGEON:  Surgeon(s) and Role:    * Loreta Ave, MD - Primary  PHYSICIAN ASSISTANT: Zonia Kief M   ANESTHESIA:   regional and general  EBL:  Total I/O In: 1300 [I.V.:1300] Out: 350 [Urine:350]   SPECIMEN:  No Specimen  DISPOSITION OF SPECIMEN:  N/A  COUNTS:  YES  TOURNIQUET:   Total Tourniquet Time Documented: Thigh (Left) - -[id number] minutes   PATIENT DISPOSITION:  PACU - hemodynamically stable.

## 2012-07-11 NOTE — Progress Notes (Signed)
ANTICOAGULATION CONSULT NOTE - Initial Consult  Pharmacy Consult for warfarin Indication: VTE prophylaxis  Allergies  Allergen Reactions  . Nsaids Other (See Comments)    Caused Ulcers.  . Cephalexin     unknown  . Darvocet (Propoxyphene-Acetaminophen)     nausea  . Percocet (Oxycodone-Acetaminophen) Nausea Only  . Penicillins Rash and Other (See Comments)    Redness    Patient Measurements:   Heparin Dosing Weight:   Vital Signs: Temp: 97.6 F (36.4 C) (07/17 1530) Temp src: Tympanic (07/17 0815) BP: 134/59 mmHg (07/17 1601) Pulse Rate: 96  (07/17 1601)  Labs: No results found for this basename: HGB:2,HCT:3,PLT:3,APTT:3,LABPROT:3,INR:3,HEPARINUNFRC:3,CREATININE:3,CKTOTAL:3,CKMB:3,TROPONINI:3 in the last 72 hours  The CrCl is unknown because both a height and weight (above a minimum accepted value) are required for this calculation.   Medical History: Past Medical History  Diagnosis Date  . Irregular heart beat   . Asthma   . GERD (gastroesophageal reflux disease)   . Arthritis   . Anginal pain     cardiac clearance on the chart-pt also has GI promblems  . Murmur      Since Birth followed by Dr Daleen Squibb.  . H/O hiatal hernia     'Sliding"  . 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 in left ear.  Left  side of body weaker than right.  Carpal tunnel bil .  Marland Kitchen Carpal tunnel syndrome     bilateral- wears splints  . Ulcer     gastric  . Anemia     takes iron    Medications:  Scheduled:    . 0.9 % NaCl with KCl 20 mEq / L   Intravenous To PACU  . 0.9 % NaCl with KCl 20 mEq / L   Intravenous To PACU  . cholecalciferol  2,000 Units Oral Daily  . diazepam  2.5 mg Oral QHS  . docusate sodium  100 mg Oral BID  . enoxaparin (LOVENOX) injection  30 mg Subcutaneous Q12H  . gabapentin  300 mg Oral QHS  . hydrochlorothiazide  12.5 mg Oral Daily  . montelukast  10 mg Oral q morning - 10a  . morphine      . pantoprazole  40 mg Oral  Q1200  . vancomycin  1,000 mg Intravenous 60 min Pre-Op  . vancomycin  1,000 mg Intravenous Q12H  . DISCONTD: Vitamin D  1 capsule Oral Daily    Assessment: 65 YOF s/p Left TKA, starting warfarin/enoxaparin for VTE prophylaxis. Baseline INr = 1.05, Hgb=11.2, platelets = 270  Goal of Therapy:  INR 2-3 Monitor platelets by anticoagulation protocol: Yes   Plan:  1. Coumadin 7.5mg  x 1 2. Daily INR  3. Coumadin education materials 4. Monitor for signs of bleeding 5. Stop enoxaparin when INR >= 1.8 as per order comments  Dannielle Huh 07/11/2012,4:27 PM

## 2012-07-11 NOTE — Transfer of Care (Signed)
Immediate Anesthesia Transfer of Care Note  Patient: [name]  Procedure(s) Performed: Procedure(s) (LRB): TOTAL KNEE ARTHROPLASTY (Left)  Patient Location: PACU  Anesthesia Type: General  Level of Consciousness: awake and patient cooperative  Airway & Oxygen Therapy: Patient Spontanous Breathing and Patient connected to nasal cannula oxygen  Post-op Assessment: Report given to PACU RN and Post -op Vital signs reviewed and stable  Post vital signs: Reviewed  Complications: No apparent anesthesia complications

## 2012-07-11 NOTE — Anesthesia Procedure Notes (Addendum)
Anesthesia Regional Block:  Femoral nerve block  Pre-Anesthetic Checklist: ,, timeout performed, Correct Patient, Correct Site, Correct Laterality, Correct Procedure, Correct Position, site marked, Risks and benefits discussed, pre-op evaluation,  At surgeon's request and post-op pain management  Laterality: Left  Prep: Maximum Sterile Barrier Precautions used and chloraprep       Needles:  Injection technique: Single-shot  Needle Type: Echogenic Stimulator Needle      Needle Gauge: 22 and 22 G    Additional Needles:  Procedures: ultrasound guided and nerve stimulator Femoral nerve block  Nerve Stimulator or Paresthesia:  Response: Patellar respose, 0.4 mA,   Additional Responses:   Narrative:  Start time: 07/11/2012 9:17 AM End time: 07/11/2012 9:29 AM Injection made incrementally with aspirations every 5 mL. Anesthesiologist: Fitzgerald,MD  Additional Notes: 2% Lidocaine skin wheel.   Femoral nerve block Procedure Name: Intubation Date/Time: 07/11/2012 10:39 AM Performed by: Romie Minus K Pre-anesthesia Checklist: Patient identified, Emergency Drugs available, Suction available, Patient being monitored and Timeout performed Patient Re-evaluated:Patient Re-evaluated prior to inductionOxygen Delivery Method: Circle system utilized Preoxygenation: Pre-oxygenation with 100% oxygen Intubation Type: IV induction, Cricoid Pressure applied and Rapid sequence Grade View: Grade I Tube type: Oral Tube size: 7.5 mm Number of attempts: 1 Airway Equipment and Method: Stylet and Video-laryngoscopy Placement Confirmation: ETT inserted through vocal cords under direct vision,  positive ETCO2 and breath sounds checked- equal and bilateral Secured at: 22 cm Tube secured with: Tape Dental Injury: Teeth and Oropharynx as per pre-operative assessment  Difficulty Due To: Difficulty was anticipated, Difficult Airway- due to anterior larynx and Difficult Airway- due to limited oral  opening Future Recommendations: Recommend- induction with short-acting agent, and alternative techniques readily available

## 2012-07-12 LAB — BASIC METABOLIC PANEL
BUN: 20 mg/dL (ref 6–23)
CO2: 26 mEq/L (ref 19–32)
Glucose, Bld: 138 mg/dL — ABNORMAL HIGH (ref 70–99)
Potassium: 5.2 mEq/L — ABNORMAL HIGH (ref 3.5–5.1)
Sodium: 138 mEq/L (ref 135–145)

## 2012-07-12 LAB — URINE MICROSCOPIC-ADD ON

## 2012-07-12 LAB — URINALYSIS, ROUTINE W REFLEX MICROSCOPIC
Bilirubin Urine: NEGATIVE
Hgb urine dipstick: NEGATIVE
Ketones, ur: NEGATIVE mg/dL
Protein, ur: NEGATIVE mg/dL
Urobilinogen, UA: 0.2 mg/dL (ref 0.0–1.0)

## 2012-07-12 LAB — CBC
HCT: 31.5 % — ABNORMAL LOW (ref 36.0–46.0)
Hemoglobin: 9.9 g/dL — ABNORMAL LOW (ref 12.0–15.0)
MCH: 25.7 pg — ABNORMAL LOW (ref 26.0–34.0)
RBC: 3.85 MIL/uL — ABNORMAL LOW (ref 3.87–5.11)

## 2012-07-12 LAB — PROTIME-INR: Prothrombin Time: 13.9 seconds (ref 11.6–15.2)

## 2012-07-12 MED ORDER — WARFARIN SODIUM 7.5 MG PO TABS
7.5000 mg | ORAL_TABLET | Freq: Once | ORAL | Status: AC
  Administered 2012-07-12: 7.5 mg via ORAL
  Filled 2012-07-12: qty 1

## 2012-07-12 NOTE — Progress Notes (Signed)
Referral received for SNF. Chart reviewed and CSW has spoken with RNCM who indicates that patient is for DC to home with Home Health per Physical Therapy recommendations.  CSW to sign off. Please re-consult if CSW needs arise.  Lorri Frederick. [place] Pugh  [phone]

## 2012-07-12 NOTE — Progress Notes (Signed)
ANTICOAGULATION CONSULT NOTE - Initial Consult  Pharmacy Consult for warfarin Indication: VTE prophylaxis  Allergies  Allergen Reactions  . Nsaids Other (See Comments)    Caused Ulcers.  . Cephalexin     unknown  . Darvocet (Propoxyphene-Acetaminophen)     nausea  . Percocet (Oxycodone-Acetaminophen) Nausea Only  . Penicillins Rash and Other (See Comments)    Redness    Patient Measurements:   Heparin Dosing Weight:   Vital Signs: Temp: 99.3 F (37.4 C) (07/18 1400) BP: 126/57 mmHg (07/18 1400) Pulse Rate: 93  (07/18 1400)  Labs:  Basename 07/12/12 0616  HGB 9.9*  HCT 31.5*  PLT 268  APTT --  LABPROT 13.9  INR 1.05  HEPARINUNFRC --  CREATININE 0.90  CKTOTAL --  CKMB --  TROPONINI --    The CrCl is unknown because both a height and weight (above a minimum accepted value) are required for this calculation.   Medical History: Past Medical History  Diagnosis Date  . Irregular heart beat   . Asthma   . GERD (gastroesophageal reflux disease)   . Arthritis   . Anginal pain     cardiac clearance on the chart-pt also has GI promblems  . Murmur      Since Birth followed by Dr Daleen Squibb.  . H/O hiatal hernia     'Sliding"  . 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 in left ear.  Left  side of body weaker than right.  Carpal tunnel bil .  Marland Kitchen Carpal tunnel syndrome     bilateral- wears splints  . Ulcer     gastric  . Anemia     takes iron    Medications:  Scheduled:     . cholecalciferol  2,000 Units Oral Daily  . diazepam  2.5 mg Oral QHS  . docusate sodium  100 mg Oral BID  . enoxaparin (LOVENOX) injection  30 mg Subcutaneous Q12H  . gabapentin  300 mg Oral QHS  . hydrochlorothiazide  12.5 mg Oral Daily  . montelukast  10 mg Oral q morning - 10a  . morphine      . pantoprazole  40 mg Oral Q1200  . patient's guide to using coumadin book   Does not apply Once  . vancomycin  1,000 mg Intravenous Q12H  . warfarin   7.5 mg Oral ONCE-1800  . warfarin   Does not apply Once  . Warfarin - Pharmacist Dosing Inpatient   Does not apply q1800  . DISCONTD: Vitamin D  1 capsule Oral Daily    Assessment: 52 YOF admitted 7/17 s/p Left TKA. On warfarin 7.5mg  and enoxaparin 30mg  sq q12h for VTE prophylaxis. INR 1.05 (unchanged from 7/12). 7/17 dose not charted as given. H&H decreased (11.2/36.5>>9.9/31.5). Plt wnl.  Goal of Therapy:  INR 2-3 Monitor platelets by anticoagulation protocol: Yes   Plan:  1. Coumadin 7.5mg  x 1 2. Daily INR  3. Coumadin education materials 4. Monitor for signs of bleeding 5. Stop enoxaparin when INR >= 1.8 as per order comments  Franchot Erichsen 07/12/2012,3:50 PM

## 2012-07-12 NOTE — Op Note (Signed)
[name], [name] NO.:  [id number]  MEDICAL RECORD NO.:  [id number]  LOCATION:  [id number]                        FACILITY:  [facility]  PHYSICIAN:  Loreta Ave, M.D. DATE OF BIRTH:  November 18, 1946  DATE OF PROCEDURE:  07/11/2012 DATE OF DISCHARGE:                              OPERATIVE REPORT   PREOPERATIVE DIAGNOSES:  Right knee end-stage degenerative arthritis, varus alignment, bone loss of proximal tibia medially.  POSTOPERATIVE DIAGNOSES:  Right knee end-stage degenerative arthritis, varus alignment, bone loss of proximal tibia medially with underlying relatively marked osteoporosis.  PROCEDURE:  Modified minimally invasive right total knee replacement with Stryker triathlon prosthesis.  Soft tissue balancing.  Cemented pegged posterior stabilized #4 femoral component.  Cemented #4 tibial component with the centralizing 12 x 100-mm stem, 9-mm polyethylene insert.  Cemented resurfacing 32-mm patellar component.  SURGEON:  Loreta Ave, MD  ASSISTANT:  Genene Churn. Barry Dienes, [place], present throughout the entire case and necessary for timely completion of procedure.  ANESTHESIA:  General.  BLOOD LOSS:  Minimal.  SPECIMENS:  None.  CULTURES:  None.  COMPLICATION:  None.  DRESSINGS:  Soft compressive knee immobilizer.  TOURNIQUET TIME:  One hour.  DRAINS:  Hemovac x1.  PROCEDURE:  The patient was brought to the operating room, placed on the operating table in supine position.  After adequate anesthesia had been obtained, the left knee examined.  Fixed varus 5 degrees, but motion 0- 100.  Tourniquet applied.  Prepped and draped in usual sterile fashion. Exsanguinated with elevation of Esmarch.  Tourniquet inflated to 250 mmHg.  Straight incision above the patella down to the tibial tubercle. Medial arthrotomy, vastus splitting preserving quad tendon.  Knee exposed.  Medial capsule release.  Throughout the entire procedure, all of the bony encountered was  extremely soft from disuse osteoporosis. Remnants of menisci, cruciate ligaments, loose bodies removed.  Distal femur exposed.  Intramedullary guide placed.  An 8-mm resection 5 degrees of valgus.  Using epicondylar axis, the femur was sized, cut, and fitted for posterior stabilized pegged #4 component.  Proximal tibial resection, 0 degree cut.  Taken below the defect medially. Debris cleared throughout the knee.  Nicely balanced in flexion and extension.  Patella exposed.  Posterior 10-mm removed.  Drilled, sized, and fitted for a pegged 32-mm component.  After clearing out debris throughout, copiously irrigating the knee.  Trials were put in place.  A #4 component above and below, 32-mm around the patella and a 9-mm insert.  With this construct, I had good alignment, nicely balanced knee, good by mechanical axis, good patellofemoral tracking, good stability.  Tibia was marked for rotation.  Because of the degree of osteopenia, I elected to use a stem in the tibia to offset forces there to prevent further loosening.  After rotation was set, the proximal tibia was reamed for the 12 x 100-mm stem.  All trials removed.  Copious irrigation with the pulse irrigating device.  Cement prepared, placed on all components.  All components were firmly seated.  Polyethylene attached to tibia.  Knee reduced.  Patella held with a clamp.  Once the cement hardened, the knee was reexamined.  Again, very pleased with alignment,  stability, motion, and tracking.  Hemovac was placed and brought out through a separate stab wound.  Arthrotomy closed with #1 Vicryl, skin and subcutaneous tissue with Vicryl and staples.  Sterile compressive dressing applied.  Tourniquet deflated and removed.  Knee immobilizer applied.  Anesthesia reversed.  Brought to the recovery room.  Tolerated the surgery well.  No complications.     Loreta Ave, M.D.     DFM/MEDQ  D:  07/11/2012  T:  07/12/2012  Job:  [id number]

## 2012-07-12 NOTE — Progress Notes (Signed)
I have reviewed the assessment and plan as outlined by S. Saneeymehri, Pharm.D.  I agree with her Coumadin dose and follow-up.  [employer], Pharm.D., BCPS Clinical Pharmacist Pager [phone] 07/12/2012 4:21 PM

## 2012-07-12 NOTE — Evaluation (Signed)
Physical Therapy Evaluation Patient Details Name: [name] MRN: [id number] DOB: [birth year] Today's Date: 07/12/2012 Time: 4098-1191 PT Time Calculation (min): 34 min  PT Assessment / Plan / Recommendation Clinical Impression  Pt is pleasent and cooperative. Pt  demonstrates deficits secondary to pain and ROM, pt will continue to benefit from skilled physical therapy to improve strength, ROM and overall functional mobility.    PT Assessment  Patient needs continued PT services    Follow Up Recommendations  Home health PT    Barriers to Discharge None      Equipment Recommendations  None recommended by PT    Recommendations for Other Services     Frequency 7X/week    Precautions / Restrictions Precautions Precautions: Knee Required Braces or Orthoses: Knee Immobilizer - Left Restrictions Weight Bearing Restrictions: Yes LLE Weight Bearing: Weight bearing as tolerated   Pertinent Vitals/Pain 6/10      Mobility  Bed Mobility Bed Mobility: Supine to Sit;Sitting - Scoot to Edge of Bed Supine to Sit: 4: Min guard Sitting - Scoot to [place] of Bed: 4: Min guard Transfers Transfers: Sit to Stand;Stand to Sit Sit to Stand: 4: Min guard;From bed Stand to Sit: 4: Min guard;With armrests;To chair/3-in-1 Ambulation/Gait Ambulation/Gait Assistance: 4: Min assist Ambulation Distance (Feet): 12 Feet Assistive device: Rolling walker Gait Pattern: Step-to pattern;Decreased stride length;Antalgic    Exercises Total Joint Exercises Ankle Circles/Pumps: AROM;Both;10 reps Heel Slides: AAROM;5 reps   PT Diagnosis: Difficulty walking;Abnormality of gait;Acute pain;Generalized weakness  PT Problem List: Decreased strength;Decreased range of motion;Decreased activity tolerance;Decreased mobility;Pain PT Treatment Interventions: Gait training;Stair training;Therapeutic activities;Therapeutic exercise   PT Goals Acute Rehab PT Goals PT Goal Formulation: With patient Time For Goal  Achievement: 07/19/12 Potential to Achieve Goals: Good Pt will go Supine/Side to Sit: with modified independence PT Goal: Supine/Side to Sit - Progress: Goal set today Pt will Sit at Edge of Bed: with modified independence PT Goal: Sit at Edge Of Bed - Progress: Goal set today Pt will go Sit to Supine/Side: with modified independence PT Goal: Sit to Supine/Side - Progress: Goal set today Pt will go Sit to Stand: with modified independence PT Goal: Sit to Stand - Progress: Goal set today Pt will Ambulate: >150 feet;with modified independence;with rolling walker PT Goal: Ambulate - Progress: Goal set today Pt will Go Up / Down Stairs: 3-5 stairs;with modified independence;with rolling walker PT Goal: Up/Down Stairs - Progress: Goal set today Pt will Perform Home Exercise Program: Independently PT Goal: Perform Home Exercise Program - Progress: Goal set today  Visit Information  Last PT Received On: 07/12/12    Subjective Data  Subjective: "I was able to get a good nights sleep last night" Patient Stated Goal: to go home   Prior Functioning  Home Living Lives With: Spouse Available Help at Discharge: Family;Friend(s);Available 24 hours/day Type of Home: House Home Access: Stairs to enter [employer] of Steps: 3 (3) Entrance Stairs-Rails: None Home Layout: One level Bathroom Shower/Tub: Tub/shower unit;Walk-in shower;Curtain Home Adaptive Equipment: Bedside commode/3-in-1;Walker - rolling;Other (comment) (CPM) Prior Function Level of Independence: Independent Able to Take Stairs?: Yes Driving: Yes    Cognition  Overall Cognitive Status: Appears within functional limits for tasks assessed/performed Arousal/Alertness: Awake/alert Orientation Level: Appears intact for tasks assessed Behavior During Session: [facility] for tasks performed    Extremity/Trunk Assessment Right Lower Extremity Assessment RLE ROM/Strength/Tone: Within functional levels Left Lower Extremity  Assessment LLE ROM/Strength/Tone: Deficits;Due to pain;Due to precautions LLE ROM/Strength/Tone Deficits: 5-40 deg Knee flexion LLE  Coordination: WFL - gross/fine motor   Balance    End of Session PT - End of Session Equipment Utilized During Treatment: Gait belt;Left knee immobilizer Activity Tolerance: Patient tolerated treatment well;Patient limited by pain (Pt reported mild dizziness during amb, resolved with sitting) Patient left: in chair;with call bell/phone within reach Nurse Communication: Mobility status;Patient requests pain meds CPM Left Knee CPM Left Knee: Off Left Knee Flexion (Degrees): 40  Left Knee Extension (Degrees): 5  Additional Comments: limited by bandage and pain  GP     Mance Vallejo 07/12/2012, 10:55 AM Charlotte Crumb PT DPT

## 2012-07-12 NOTE — Progress Notes (Signed)
Subjective: Doing well.  Pain controlled.   Objective: Vital signs in last 24 hours: Temp:  [97.2 F (36.2 C)-98.7 F (37.1 C)] 98.4 F (36.9 C) (07/18 0606) Pulse Rate:  [73-97] 97  (07/18 0606) Resp:  [12-18] 18  (07/18 0606) BP: (118-167)/(50-89) 123/68 mmHg (07/18 0606) SpO2:  [95 %-98 %] 95 % (07/18 0900)  Intake/Output from previous day: 07/17 0701 - 07/18 0700 In: 1300 [I.V.:1300] Out: 1450 [Urine:1150; Drains:300] Intake/Output this shift:     Basename 07/12/12 0616  HGB 9.9*    Basename 07/12/12 0616  WBC 19.1*  RBC 3.85*  HCT 31.5*  PLT 268    Basename 07/12/12 0616  NA 138  K 5.2*  CL 103  CO2 26  BUN 20  CREATININE 0.90  GLUCOSE 138*  CALCIUM 9.1    Basename 07/12/12 0616  LABPT --  INR 1.05    Exam:  Dressing c/d/i.  Calf nt, nvi. Assessment/Plan: Anticipate d/c home fri or sat. D/c dilaudid and foley.     Riordan Walle M 07/12/2012, 10:48 AM

## 2012-07-12 NOTE — Progress Notes (Signed)
Physical Therapy Treatment Patient Details Name: [name] MRN: [id number] DOB: 1946/01/31 Today's Date: 07/12/2012 Time: 8657-8469 PT Time Calculation (min): 24 min  PT Assessment / Plan / Recommendation Comments on Treatment Session       Follow Up Recommendations  Home health PT    Barriers to Discharge        Equipment Recommendations  None recommended by PT    Recommendations for Other Services    Frequency 7X/week   Plan      Precautions / Restrictions Precautions Precautions: Knee Required Braces or Orthoses: Knee Immobilizer - Left Restrictions Weight Bearing Restrictions: Yes LLE Weight Bearing: Weight bearing as tolerated   Pertinent Vitals/Pain 6/10    Mobility  Bed Mobility Bed Mobility: Sit to Supine Sit to Supine: 4: Min assist Transfers Transfers: Sit to Stand;Stand to Sit;Stand Pivot Transfers Sit to Stand: 4: Min guard Stand to Sit: 4: Min guard Stand Pivot Transfers: 4: Min guard Details for Transfer Assistance: Min guard with verbal cues for hand placement Ambulation/Gait Ambulation/Gait Assistance: 4: Min guard;4: Min Environmental consultant (Feet): 60 Feet Assistive device: Rolling walker Ambulation/Gait Assistance Details: min guard with step to gait initially, progressed to step through Gait Pattern: Step-through pattern    Exercises Total Joint Exercises Ankle Circles/Pumps: AROM;10 reps   PT Diagnosis:    PT Problem List:   PT Treatment Interventions:     PT Goals Acute Rehab PT Goals PT Goal Formulation: With patient Time For Goal Achievement: 07/19/12 Potential to Achieve Goals: Good Pt will go Supine/Side to Sit: with modified independence PT Goal: Supine/Side to Sit - Progress: Progressing toward goal Pt will Sit at Edge of Bed: with modified independence PT Goal: Sit at [place] Of Bed - Progress: Progressing toward goal Pt will go Sit to Supine/Side: with modified independence PT Goal: Sit to Supine/Side - Progress:  Progressing toward goal Pt will go Sit to Stand: with modified independence PT Goal: Sit to Stand - Progress: Progressing toward goal Pt will Ambulate: >150 feet;with modified independence;with rolling walker PT Goal: Ambulate - Progress: Progressing toward goal Pt will Perform Home Exercise Program: Independently PT Goal: Perform Home Exercise Program - Progress: Progressing toward goal  Visit Information  Last PT Received On: 07/12/12    Subjective Data  Subjective: "I felt a little shaky on my feet when I got up to go to the bathroom earlier" Patient Stated Goal: to go home   Cognition  Overall Cognitive Status: Appears within functional limits for tasks assessed/performed Arousal/Alertness: Awake/alert Orientation Level: Appears intact for tasks assessed Behavior During Session: [facility] for tasks performed    Balance     End of Session PT - End of Session Equipment Utilized During Treatment: Gait belt;Left knee immobilizer Patient left: in bed;in CPM Nurse Communication: Mobility status   GP     Dylyn Mclaren 07/12/2012, 2:58 PM Charlotte Crumb PT, DPT

## 2012-07-13 ENCOUNTER — Encounter (HOSPITAL_COMMUNITY): Payer: Self-pay | Admitting: Orthopedic Surgery

## 2012-07-13 LAB — CBC
MCH: 26.5 pg (ref 26.0–34.0)
Platelets: 217 10*3/uL (ref 150–400)
RBC: 3.59 MIL/uL — ABNORMAL LOW (ref 3.87–5.11)
WBC: 13.3 10*3/uL — ABNORMAL HIGH (ref 4.0–10.5)

## 2012-07-13 LAB — PROTIME-INR
INR: 1.18 (ref 0.00–1.49)
Prothrombin Time: 15.3 seconds — ABNORMAL HIGH (ref 11.6–15.2)

## 2012-07-13 LAB — BASIC METABOLIC PANEL
CO2: 29 mEq/L (ref 19–32)
Calcium: 8.9 mg/dL (ref 8.4–10.5)
Chloride: 101 mEq/L (ref 96–112)
Potassium: 3.7 mEq/L (ref 3.5–5.1)
Sodium: 138 mEq/L (ref 135–145)

## 2012-07-13 MED ORDER — WARFARIN SODIUM 5 MG PO TABS: 5.0000 mg | ORAL_TABLET | Freq: Every day | ORAL | Status: DC

## 2012-07-13 MED ORDER — HYDROCODONE-ACETAMINOPHEN 10-325 MG PO TABS: 1.0000 | ORAL_TABLET | ORAL | Status: AC | PRN

## 2012-07-13 MED ORDER — ENOXAPARIN SODIUM 30 MG/0.3ML ~~LOC~~ SOLN: 30.0000 mg | Freq: Two times a day (BID) | SUBCUTANEOUS | Status: DC

## 2012-07-13 MED ORDER — WARFARIN SODIUM 7.5 MG PO TABS
7.5000 mg | ORAL_TABLET | Freq: Once | ORAL | Status: DC
  Filled 2012-07-13: qty 1

## 2012-07-13 MED ORDER — METHOCARBAMOL 500 MG PO TABS: 500.0000 mg | ORAL_TABLET | Freq: Four times a day (QID) | ORAL | Status: AC | PRN

## 2012-07-13 NOTE — Evaluation (Signed)
Occupational Therapy Evaluation and Discharge Patient Details Name: [name] MRN: [id number] DOB: Jan 20, 1946 Today's Date: 07/13/2012 Time: 4098-1191 OT Time Calculation (min): 15 min  OT Assessment / Plan / Recommendation Clinical Impression  This 66 yo female s/p RTKA presents to acute OT with all education completed. D/C from acute OT.    OT Assessment  Patient does not need any further OT services    Follow Up Recommendations  No OT follow up    Barriers to Discharge      Equipment Recommendations  None recommended by OT    Recommendations for Other Services    Frequency       Precautions / Restrictions Precautions Precautions: Knee Required Braces or Orthoses: Knee Immobilizer - Left Knee Immobilizer - Left: On when out of bed or walking Restrictions Weight Bearing Restrictions: Yes LLE Weight Bearing: Weight bearing as tolerated       ADL  Eating/Feeding: Simulated;Independent Where Assessed - Eating/Feeding: Chair Grooming: Simulated;Set up Where Assessed - Grooming: Unsupported sitting Upper Body Bathing: Simulated;Set up Where Assessed - Upper Body Bathing: Unsupported sitting Lower Body Bathing: Simulated;Minimal assistance Where Assessed - Lower Body Bathing: Unsupported sitting Upper Body Dressing: Simulated;Set up Where Assessed - Upper Body Dressing: Unsupported sitting Lower Body Dressing: Simulated;Minimal assistance Where Assessed - Lower Body Dressing: Unsupported sitting ADL Comments: Pt reports she has been getting up to bathroom with A of staff without issues. Went over how she could put her 3-n-1 in the her shower stall at home facing the curtain, back up to the threshold of shower stall and step in with her good leg first-then when she comes out to step out with bad leg first. Also advised that she may want a hand held shower head.    OT Diagnosis:    OT Problem List:   OT Treatment Interventions:     OT Goals    Visit Information  Last OT Received On: 07/13/12 Assistance Needed: +1    Subjective Data  Subjective: I will have my husband home to help me if I need it Patient Stated Goal: Did not ask   Prior Functioning  Vision/Perception  Home Living Lives With: Spouse Available Help at Discharge: Family;Friend(s);Available 24 hours/day Type of Home: House Home Access: Stairs to enter [employer] of Steps: 3 Entrance Stairs-Rails: None Home Layout: One level Bathroom Shower/Tub: Walk-in Contractor: Standard Bathroom Accessibility: Yes How Accessible: Accessible via walker Home Adaptive Equipment: Bedside commode/3-in-1;Walker - rolling (CPM) Prior Function Level of Independence: Independent Driving: Yes Communication Communication: No difficulties Dominant Hand: Right      Cognition  Overall Cognitive Status: Appears within functional limits for tasks assessed/performed Arousal/Alertness: Awake/alert Orientation Level: Appears intact for tasks assessed Behavior During Session: [facility] for tasks performed    Extremity/Trunk Assessment Right Upper Extremity Assessment RUE ROM/Strength/Tone: Within functional levels Left Upper Extremity Assessment LUE ROM/Strength/Tone: Within functional levels   Mobility Bed Mobility Bed Mobility: Not assessed Transfers Sit to Stand: 5: Supervision;From chair/3-in-1;From toilet Stand to Sit: 5: Supervision;To chair/3-in-1;To toilet Details for Transfer Assistance: cueing for hand and leg placement   Exercise Total Joint Exercises Heel Slides: AAROM;10 reps;Seated;Left Straight Leg Raises: AROM;Left;15 reps;Seated Long Arc Quad: AROM;Left;15 reps;Seated  Balance    End of Session OT - End of Session Activity Tolerance: Patient tolerated treatment well Patient left: in chair;with call bell/phone within reach CPM Left Knee CPM Left Knee: [address]       Evette Georges [phone] 07/13/2012, 11:58 AM

## 2012-07-13 NOTE — Progress Notes (Signed)
Pt refused  supp for her bowels

## 2012-07-13 NOTE — Progress Notes (Signed)
ANTICOAGULATION CONSULT NOTE - Initial Consult  Pharmacy Consult for warfarin Indication: VTE prophylaxis  Allergies  Allergen Reactions  . Nsaids Other (See Comments)    Caused Ulcers.  . Cephalexin     unknown  . Darvocet (Propoxyphene-Acetaminophen)     nausea  . Percocet (Oxycodone-Acetaminophen) Nausea Only  . Penicillins Rash and Other (See Comments)    Redness    Patient Measurements:   Vital Signs: Temp: 99 F (37.2 C) (07/19 0451) BP: 105/87 mmHg (07/19 0451) Pulse Rate: 114  (07/19 0451)  Labs:  Alvira Philips 07/13/12 0545 07/12/12 0616  HGB 9.5* 9.9*  HCT 29.2* 31.5*  PLT 217 268  APTT -- --  LABPROT 15.3* 13.9  INR 1.18 1.05  HEPARINUNFRC -- --  CREATININE 0.82 0.90  CKTOTAL -- --  CKMB -- --  TROPONINI -- --    The CrCl is unknown because both a height and weight (above a minimum accepted value) are required for this calculation.   Medical History: Past Medical History  Diagnosis Date  . Irregular heart beat   . Asthma   . GERD (gastroesophageal reflux disease)   . Arthritis   . Anginal pain     cardiac clearance on the chart-pt also has GI promblems  . Murmur      Since Birth followed by Dr Daleen Squibb.  . H/O hiatal hernia     'Sliding"  . 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 in left ear.  Left  side of body weaker than right.  Carpal tunnel bil .  Marland Kitchen Carpal tunnel syndrome     bilateral- wears splints  . Ulcer     gastric  . Anemia     takes iron    Medications:  Scheduled:     . cholecalciferol  2,000 Units Oral Daily  . diazepam  2.5 mg Oral QHS  . docusate sodium  100 mg Oral BID  . enoxaparin (LOVENOX) injection  30 mg Subcutaneous Q12H  . gabapentin  300 mg Oral QHS  . hydrochlorothiazide  12.5 mg Oral Daily  . montelukast  10 mg Oral q morning - 10a  . pantoprazole  40 mg Oral Q1200  . patient's guide to using coumadin book   Does not apply Once  . warfarin  7.5 mg Oral ONCE-1800  .  warfarin  7.5 mg Oral ONCE-1800  . Warfarin - Pharmacist Dosing Inpatient   Does not apply q1800    Assessment: 59 YOF admitted 7/17 s/p Left TKA. On warfarin 7.5mg  and enoxaparin 30mg  sq q12h for VTE prophylaxis. INR 1.18. H&H decreased to 9.5/29.2. Plt wnl.  Goal of Therapy:  INR 2-3 Monitor platelets by anticoagulation protocol: Yes   Plan:  1. Coumadin 7.5mg  x 1 2. Daily INR  4. Monitor for signs of bleeding 5. Stop enoxaparin when INR >= 1.8 as per order comments  Franchot Erichsen 07/13/2012,3:05 PM

## 2012-07-13 NOTE — Progress Notes (Signed)
Physical Therapy Treatment Patient Details Name: [name] MRN: [id number] DOB: 12-10-1946 Today's Date: 07/13/2012 Time: 2956-2130 PT Time Calculation (min): 19 min  PT Assessment / Plan / Recommendation Comments on Treatment Session  Pt s/p LTKA continuing to progress with mobility and activity. Pt educated for HEP and mobility progression.     Follow Up Recommendations       Barriers to Discharge        Equipment Recommendations       Recommendations for Other Services    Frequency     Plan Discharge plan remains appropriate;Frequency remains appropriate    Precautions / Restrictions Precautions Precautions: Knee Required Braces or Orthoses: Knee Immobilizer - Left Knee Immobilizer - Left: On when out of bed or walking Restrictions LLE Weight Bearing: Weight bearing as tolerated   Pertinent Vitals/Pain No pain, premedicated    Mobility  Bed Mobility Bed Mobility: Not assessed Transfers Transfers: Sit to Stand;Stand to Sit Sit to Stand: 5: Supervision;From chair/3-in-1;From toilet Stand to Sit: 5: Supervision;To chair/3-in-1;To toilet Details for Transfer Assistance: cueing for hand and leg placement Ambulation/Gait Ambulation/Gait Assistance: 5: Supervision Ambulation Distance (Feet): 90 Feet (15' then 90') Assistive device: Rolling walker Ambulation/Gait Assistance Details: cueing for upright posture, stepping into RW and sequence Gait Pattern: Step-to pattern;Trunk flexed Stairs: No    Exercises Total Joint Exercises Heel Slides: AAROM;10 reps;Seated;Left Straight Leg Raises: AROM;Left;15 reps;Seated Long Arc Quad: AROM;Left;15 reps;Seated   PT Diagnosis:    PT Problem List:   PT Treatment Interventions:     PT Goals Acute Rehab PT Goals PT Goal: Sit to Stand - Progress: Progressing toward goal PT Goal: Ambulate - Progress: Progressing toward goal PT Goal: Perform Home Exercise Program - Progress: Progressing toward goal  Visit Information  Last PT Received On: 07/13/12 Assistance Needed: +1    Subjective Data  Subjective: I'm a little drowsy after my pain medicine   Cognition  Overall Cognitive Status: Appears within functional limits for tasks assessed/performed Arousal/Alertness: Awake/alert Orientation Level: Appears intact for tasks assessed Behavior During Session: [facility] for tasks performed    Balance     End of Session PT - End of Session Equipment Utilized During Treatment: Left knee immobilizer Activity Tolerance: Patient tolerated treatment well Patient left: in chair;with call bell/phone within reach CPM Left Knee CPM Left Knee: Off   GP     Toney Sang Beth 07/13/2012, 9:25 AM Delaney Meigs, PT [phone]

## 2012-07-13 NOTE — Progress Notes (Signed)
Pt understood and was able to repeat her discharge orders along with her husband  And fu care alone with s/s of infection s, RX Meds Coumadin

## 2012-07-13 NOTE — Progress Notes (Signed)
I have reviewed the assessment and plan as outlined by Dr. Griffin Basil. I agree with her Coumadin dose and follow-up.  Maudry Mayhew, PharmD Pgr [phone] 07/13/2012 3:32 PM

## 2012-07-13 NOTE — Progress Notes (Signed)
Physical Therapy Treatment Patient Details Name: [name] MRN: [id number] DOB: 1946-09-29 Today's Date: 07/13/2012 Time: 4098-1191 PT Time Calculation (min): 25 min  PT Assessment / Plan / Recommendation Comments on Treatment Session  Pt progressing well, completed stairs this session. Educated pt and husband on proper sequencing and safety upon d/c home. Pt safe to d/c tonight/tomorrow    Follow Up Recommendations  Home health PT    Barriers to Discharge        Equipment Recommendations  None recommended by OT    Recommendations for Other Services    Frequency 7X/week   Plan Discharge plan remains appropriate;Frequency remains appropriate    Precautions / Restrictions Precautions Precautions: Knee Required Braces or Orthoses: Knee Immobilizer - Left Knee Immobilizer - Left: On when out of bed or walking Restrictions Weight Bearing Restrictions: Yes LLE Weight Bearing: Weight bearing as tolerated       Mobility  Bed Mobility Bed Mobility: Supine to Sit;Sitting - Scoot to Edge of Bed Supine to Sit: 6: Modified independent (Device/Increase time) Sitting - Scoot to Edge of Bed: 6: Modified independent (Device/Increase time) Transfers Transfers: Sit to Stand;Stand to Sit Sit to Stand: 5: Supervision;With upper extremity assist;From bed;From chair/3-in-1;From toilet Stand to Sit: 5: Supervision;With upper extremity assist;To chair/3-in-1;To toilet Details for Transfer Assistance: VC for proper hand placement and sequencing.  Ambulation/Gait Ambulation/Gait Assistance: 5: Supervision Ambulation Distance (Feet): 20 Feet Assistive device: Rolling walker Ambulation/Gait Assistance Details: VC for proper sequencing and safe distance to RW Gait Pattern: Step-to pattern;Trunk flexed Gait velocity: decreased gait speed Stairs: Yes Stairs Assistance: 4: Min guard (assist with RW) Stair Management Technique: No rails;Backwards;Step to pattern;With walker Number of Stairs: 5      Exercises     PT Diagnosis:    PT Problem List:   PT Treatment Interventions:     PT Goals Acute Rehab PT Goals PT Goal: Supine/Side to Sit - Progress: Met PT Goal: Sit at Edge Of Bed - Progress: Met PT Goal: Sit to Stand - Progress: Progressing toward goal PT Goal: Ambulate - Progress: Progressing toward goal PT Goal: Up/Down Stairs - Progress: Progressing toward goal  Visit Information  Last PT Received On: 07/13/12 Assistance Needed: +1    Subjective Data      Cognition  Overall Cognitive Status: Appears within functional limits for tasks assessed/performed Arousal/Alertness: Awake/alert Orientation Level: Appears intact for tasks assessed Behavior During Session: [facility] for tasks performed    Balance     End of Session PT - End of Session Equipment Utilized During Treatment: Gait belt Activity Tolerance: Patient tolerated treatment well Patient left: in chair;with call bell/phone within reach;with family/visitor present Nurse Communication: Mobility status     Milana Kidney 07/13/2012, 5:00 PM  07/13/2012 Milana Kidney DPT PAGER: [phone] OFFICE: [phone]

## 2012-07-13 NOTE — Progress Notes (Signed)
Subjective: Doing ok. C/o knee pain.  Did well with therapy yesterday.    Objective: Vital signs in last 24 hours: Temp:  [99 F (37.2 C)-100.1 F (37.8 C)] 99 F (37.2 C) (07/19 0451) Pulse Rate:  [93-115] 114  (07/19 0451) Resp:  [16-18] 18  (07/19 0451) BP: (105-135)/(55-87) 105/87 mmHg (07/19 0451) SpO2:  [95 %-99 %] 99 % (07/19 0451)  Intake/Output from previous day: 07/18 0701 - 07/19 0700 In: 600 [P.O.:600] Out: 250 [Urine:100; Drains:150] Intake/Output this shift:     Basename 07/13/12 0545 07/12/12 0616  HGB 9.5* 9.9*    Basename 07/13/12 0545 07/12/12 0616  WBC 13.3* 19.1*  RBC 3.59* 3.85*  HCT 29.2* 31.5*  PLT 217 268    Basename 07/13/12 0545 07/12/12 0616  NA 138 138  K 3.7 5.2*  CL 101 103  CO2 29 26  BUN 14 20  CREATININE 0.82 0.90  GLUCOSE 135* 138*  CALCIUM 8.9 9.1    Basename 07/13/12 0545 07/12/12 0616  LABPT -- --  INR 1.18 1.05    Exam:  Wound looks good.  Staples intact.  Drain removed.  No signs of infection.  Calf nt, nvi.  Assessment/Plan: Anticipate d/c home sat.  Saline lock iv.     Roderic Lammert M 07/13/2012, 11:48 AM

## 2012-07-14 LAB — URINE CULTURE

## 2012-07-18 NOTE — Discharge Summary (Signed)
  ABBREVIATED DISCHARGE SUMMARY      DATE OF HOSPITALIZATION:  11 July 2012  REASON FOR HOSPITALIZATION:  66 yo wf with end stage djd left knee and chronic pain.      SIGNIFICANT FINDINGS:  DJD  OPERATION:   Left total knee replacement  FINAL DIAGNOSIS:  same  SECONDARY DIAGNOSIS: none  CONSULTANTS:  none  DISCHARGE CONDITION:  STABLE  DISCHARGED TO:  HOME

## 2012-07-26 ENCOUNTER — Ambulatory Visit (HOSPITAL_COMMUNITY)
Admission: RE | Admit: 2012-07-26 | Discharge: 2012-07-26 | Disposition: A | Discharge status: Home / Self Care | Payer: 59 | Source: Ambulatory Visit | Attending: Orthopedic Surgery | Admitting: Orthopedic Surgery

## 2012-07-26 DIAGNOSIS — R269 Unspecified abnormalities of gait and mobility: Secondary | ICD-10-CM | POA: Insufficient documentation

## 2012-07-26 DIAGNOSIS — M25669 Stiffness of unspecified knee, not elsewhere classified: Secondary | ICD-10-CM | POA: Insufficient documentation

## 2012-07-26 DIAGNOSIS — M6281 Muscle weakness (generalized): Secondary | ICD-10-CM | POA: Insufficient documentation

## 2012-07-26 DIAGNOSIS — M25569 Pain in unspecified knee: Secondary | ICD-10-CM | POA: Insufficient documentation

## 2012-07-26 DIAGNOSIS — IMO0001 Reserved for inherently not codable concepts without codable children: Secondary | ICD-10-CM | POA: Insufficient documentation

## 2012-07-26 NOTE — Evaluation (Signed)
Physical Therapy Evaluation  Patient Details  Name: STAISHA WINIARSKI MRN: [id number] Date of Birth: 1946/05/14  Today's Date: 07/26/2012 Time: 0940-1020 PT Time Calculation (min): 40 min  Visit#: 1  of 12   Re-eval: 08/25/12    Authorization: united medicare  Authorization Time Period:    Authorization Visit#:   of     Past Medical History:  Past Medical History  Diagnosis Date  . Irregular heart beat   . Asthma   . GERD (gastroesophageal reflux disease)   . Arthritis   . Anginal pain     cardiac clearance on the chart-pt also has GI promblems  . Murmur      Since Birth followed by Dr Daleen Squibb.  . H/O hiatal hernia     'Sliding"  . 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 in left ear.  Left  side of body weaker than right.  Carpal tunnel bil .  Marland Kitchen Carpal tunnel syndrome     bilateral- wears splints  . Ulcer     gastric  . Anemia     takes iron   Past Surgical History:  Past Surgical History  Procedure Date  . Appendectomy   . Cholecystectomy   . Neck surgery     x 2   . Knee arthroscopy     Left  . Esophagogastroduodenoscopy 12/08/2011    Procedure: ESOPHAGOGASTRODUODENOSCOPY (EGD);  Surgeon: Malissa Hippo, MD;  Location: [facility];  Service: Endoscopy;  Laterality: N/A;  3:00   . Colonoscopy   . Upper gastrointestinal endoscopy   . Hysterscopy   . Total knee arthroplasty 07/11/2012    Procedure: TOTAL KNEE ARTHROPLASTY;  Surgeon: Loreta Ave, MD;  Location: [facility] OR;  Service: Orthopedics;  Laterality: Left;  left total knee arthroplasty    Subjective Symptoms/Limitations Symptoms: Ms. Difranco states that she had a TKR done on 07/11/12.  She was hospitalized for 2 days and recieved [facility] and was discharged to OP on 07/25/12.   How long can you sit comfortably?: Ms. Rummell states that she can sit for about 30 minutes before her knee becomes uncomfortable. How long can you stand comfortably?: The patient states that she is able to  stand 15 minutes. How long can you walk comfortably?: The patient comes to the department with a walker;  She states that the longest she has walked for with the walker is ten minutes. Pain Assessment Currently in Pain?: Yes Pain Score:   6 (lowest is a 2/10; highest is a 6/10) Pain Location: Knee Pain Orientation: Left Pain Type: Acute pain;Surgical pain Pain Frequency: Constant Pain Relieving Factors: ice, meds, elevation.  Precautions/Restrictions     Prior Functioning  Home Living Lives With: Spouse Available Help at Discharge: Family;Friend(s);Available 24 hours/day Type of Home: House Home Access: Stairs to enter [employer] of Steps: 3 (going up backwards at this time.) Entrance Stairs-Rails: None Home Layout: One level Bathroom Shower/Tub: Walk-in Contractor: Standard Bathroom Accessibility: Yes How Accessible: Accessible via walker Home Adaptive Equipment: Bedside commode/3-in-1;Walker - rolling (CPM) Prior Function Driving: Yes Vocation: Retired Leisure: Hobbies-no Comments: looks after mother 2x week.  Cognition/Observation    Sensation/Coordination/Flexibility/Functional Tests Functional Tests Functional Tests: LEFS 12/80  Assessment LLE AROM (degrees) Left Knee Extension: 10  Left Knee Flexion: 98  LLE PROM (degrees) Left Knee Extension: 8 Left Knee Flexion: 102 LLE Strength Left Hip Flexion: 5/5 Left Hip Extension: 3-/5 Left Hip ABduction: 3/5 Left Hip ADduction: 4/5  Left Knee Flexion: 5/5 Left Knee Extension: 3/5 Left Ankle Dorsiflexion: 5/5  Exercise/Treatments Mobility/Balance Pt to department with walker     Stretches Gastroc Stretch: 1 rep;20 seconds Supine Quad Sets: 5 reps Terminal Knee Extension: 5 reps Sidelying Hip ABduction: 10 reps Prone  Hip Extension: 5 reps   Physical Therapy Assessment and Plan PT Assessment and Plan Clinical Impression Statement: Pt with decreased strength, ROM and  pain affecting ability to walk, stairclimb and stand who will benefit from skilled PT to return pt to prior functional level. Pt will benefit from skilled therapeutic intervention in order to improve on the following deficits: Decreased activity tolerance;Decreased balance;Decreased range of motion;Decreased strength;Difficulty walking;Pain;Impaired flexibility;Increased edema Rehab Potential: Good PT Frequency: Min 3X/week PT Duration: 4 weeks PT Treatment/Interventions: Gait training;Therapeutic activities;Therapeutic exercise;Patient/family education;Balance training PT Plan: begin bike, gt with cane, heelraise, squat, rockerboard, standing terminal extension and SLS next treatment may do massage if pt still c/o of gastroc soreness    Goals Home Exercise Program Pt will Perform Home Exercise Program: Independently PT Short Term Goals Time to Complete Short Term Goals: 2 weeks PT Short Term Goal 1: ROM to be 0-110 to allow normalized gt PT Short Term Goal 2: Pt to be using a cane inside and outside the house PT Short Term Goal 3: Pt strength to be increased 1/2 grade PT Short Term Goal 4: improve mm strength by 1/2 grade PT Long Term Goals Time to Complete Long Term Goals: 4 weeks PT Long Term Goal 1: ROM to be to 120 to allow squatting activity. PT Long Term Goal 2: Pt to be able to sit for an hour and a half for watching a movie or traveling Long Term Goal 3: mm strength to be increased by one grade to allow walking without AD Long Term Goal 4: Pt to be able to ascend and descend steps reciprocally using hand rail PT Long Term Goal 5: Pt pain to be no greater than a 2 80% of the day Additional PT Long Term Goals?: Yes PT Long Term Goal 6: LEFS to impove by 10 points  PT Long Term Goal 7: Pt to be ambulating at least 30 minutes a day.  Problem List Patient Active Problem List  Diagnosis  . HYPERTENSION  . ASTHMA, MILD  . GERD  . HIATAL HERNIA  . HEART MURMUR, SYSTOLIC  .  OVERWEIGHT/OBESITY  . PUD (peptic ulcer disease)    PT - End of Session Activity Tolerance: Patient tolerated treatment well General Behavior During Session: [facility] for tasks performed Cognition: [facility] for tasks performed PT Plan of Care PT Home Exercise Plan: given Consulted and Agree with Plan of Care: Patient    RUSSELL,CINDY 07/26/2012, 10:28 AM  Physician Documentation Your signature is required to indicate approval of the treatment plan as stated above.  Please sign and either send electronically or make a copy of this report for your files and return this physician signed original.   Please mark one 1.__approve of plan  2. ___approve of plan with the following conditions.   ______________________________                                                          _____________________ Physician Signature  Date  

## 2012-07-31 ENCOUNTER — Ambulatory Visit (HOSPITAL_COMMUNITY)
Admission: RE | Admit: 2012-07-31 | Discharge: 2012-07-31 | Disposition: A | Discharge status: Home / Self Care | Payer: 59 | Source: Ambulatory Visit

## 2012-07-31 NOTE — Progress Notes (Signed)
Physical Therapy Treatment Patient Details  Name: [name] MRN: [id number] Date of Birth: Sep 25, 1946  Today's Date: 07/31/2012 Time: 7829-5621 PT Time Calculation (min): 63 min  Visit#: 2  of 12   Re-eval: 08/25/12  Charge: therex 31 Manual 10 Gait 12 Ice 10  Authorization: united medicare   Subjective: Symptoms/Limitations Symptoms: Pt stated L LE swollen with pain scale 6/10 today.  She took pain meds about an hour prior to therapy today. Pain Assessment Currently in Pain?: Yes Pain Score:   6 Pain Location: Knee Pain Orientation: Anterior;Left  Objective:   Exercise/Treatments Stretches Gastroc Stretch: 3 reps;30 seconds Aerobic Stationary Bike: 6' @ 1.0 full revolution Standing Heel Raises: 20 reps;Limitations Heel Raises Limitations: toe raises 20 reps Terminal Knee Extension: Left;10 reps;Theraband Theraband Level (Terminal Knee Extension): Level 4 (Blue) Functional Squat: 20 reps Rocker Board: 2 minutes;Limitations Rocker Board Limitations: R/L with HHA SLS: L 5",  R 10" max of 3 Supine Quad Sets: 10 reps Terminal Knee Extension: 10 reps Sidelying Hip ABduction: 10 reps Prone  Hip Extension: 10 reps   Modalities Modalities: Cryotherapy Manual Therapy Manual Therapy: Edema management Edema Management: Retro massagex 10 min Cryotherapy Number Minutes Cryotherapy: 10 Minutes Cryotherapy Location: Knee Type of Cryotherapy: Ice pack  Physical Therapy Assessment and Plan PT Assessment and Plan Clinical Impression Statement: Began POC per PT plan for overall L knee strengthening, ROM, proper gait mechanics and edema/pain reduction.  Pt able to complete full revolution on bike at seat 9 without difficulty.  Pt able to complete therex with multimodal cueing for proper techniques.  Pt with improved gait mechanics and pain reduced at end of session. PT Plan: Continue with current POC, progress strength and ROM, improve gait mechanics and balance.  Retro  massage for edema control as needed.    Goals    Problem List Patient Active Problem List  Diagnosis  . HYPERTENSION  . ASTHMA, MILD  . GERD  . HIATAL HERNIA  . HEART MURMUR, SYSTOLIC  . OVERWEIGHT/OBESITY  . PUD (peptic ulcer disease)    PT - End of Session Activity Tolerance: Patient tolerated treatment well General Behavior During Session: [facility] for tasks performed Cognition: [facility] for tasks performed  GP    Juel Burrow 07/31/2012, 6:43 PM

## 2012-08-02 ENCOUNTER — Ambulatory Visit (HOSPITAL_COMMUNITY)
Admission: RE | Admit: 2012-08-02 | Discharge: 2012-08-02 | Disposition: A | Discharge status: Home / Self Care | Payer: 59 | Source: Ambulatory Visit

## 2012-08-02 NOTE — Progress Notes (Signed)
Physical Therapy Treatment Patient Details  Name: [name] MRN: [id number] Date of Birth: 18-Aug-1946  Today's Date: 08/02/2012 Time: 1478-2956 PT Time Calculation (min): 65 min  Visit#: 3  of 12   Re-eval: 08/25/12  Charge: therex 53', ice 10'  Authorization: united medicare  Authorization Time Period:    Authorization Visit#:   of     Subjective: Symptoms/Limitations Symptoms: Pt stated L knee pain scale 4/10. Pain Assessment Currently in Pain?: Yes Pain Score:   4 Pain Location: Knee Pain Orientation: Left  Objective:  Exercise/Treatments Stretches Active Hamstring Stretch: 3 reps;30 seconds (with rope) Quad Stretch: 3 reps;30 seconds (with rope) Aerobic Stationary Bike: 10' @ 1.0 full revolution for ROM seat 8 Standing Heel Raises: 20 reps;Limitations Heel Raises Limitations: toe raises 20 reps Terminal Knee Extension: Left;10 reps;Theraband Theraband Level (Terminal Knee Extension): Level 4 (Blue) Functional Squat: 20 reps Rocker Board: 2 minutes;Limitations Rocker Board Limitations: R/L with HHA SLS: R 4", L 7" several attempts Seated Long Arc Quad: 15 reps;Weights Long Arc Quad Weight: 3 lbs. Supine Quad Sets: 15 reps Heel Slides: 15 reps Bridges: 20 reps Other Supine Knee Exercises: PROM flexion and extesion 2 sets x 30" Sidelying Hip ABduction: 15 reps Hip ADduction: 15 reps Prone  Hip Extension: 15 reps   Modalities Modalities: Cryotherapy Cryotherapy Number Minutes Cryotherapy: 10 Minutes Cryotherapy Location: Knee Type of Cryotherapy: Ice pack  Physical Therapy Assessment and Plan PT Assessment and Plan Clinical Impression Statement: Added new therex for strengthening and ROM.  Pt did state quad soreness, pt was instructed quad and hamstring stretch to add to her HEP.  Began PROM, pt tolerated well to total session. PT Plan: Continue with current POC, begin step training next session, balance activities and increase ROM.      Goals    Problem List Patient Active Problem List  Diagnosis  . HYPERTENSION  . ASTHMA, MILD  . GERD  . HIATAL HERNIA  . HEART MURMUR, SYSTOLIC  . OVERWEIGHT/OBESITY  . PUD (peptic ulcer disease)    PT - End of Session Activity Tolerance: Patient tolerated treatment well General Behavior During Session: [facility] for tasks performed Cognition: [facility] for tasks performed  GP    Juel Burrow 08/02/2012, 6:36 PM

## 2012-08-03 ENCOUNTER — Ambulatory Visit (HOSPITAL_COMMUNITY)
Admission: RE | Admit: 2012-08-03 | Discharge: 2012-08-03 | Disposition: A | Discharge status: Home / Self Care | Payer: 59 | Source: Ambulatory Visit

## 2012-08-03 NOTE — Progress Notes (Signed)
Physical Therapy Treatment Patient Details  Name: [name] MRN: [id number] Date of Birth: Mar 28, 1946  Today's Date: 08/03/2012 Time: 7829-5621 PT Time Calculation (min): 61 min  Visit#: 4  of 12   Re-eval: 08/25/12  Charge: therex 41 min, ice 10 min  Authorization: [place] [employer]  Authorization Time Period:    Authorization Visit#:   of     Subjective: Symptoms/Limitations Symptoms: Knee is feeling good today, no pain just a little tight today.  Had a good workout last session. Pain Assessment Currently in Pain?: No/denies  Objective:   Exercise/Treatments Stretches Active Hamstring Stretch: 3 reps;30 seconds Quad Stretch: 3 reps;30 seconds Gastroc Stretch: 3 reps;30 seconds (with slant board) Aerobic Stationary Bike: 10' @ 3.0 full revolution for ROM seat 8 Standing Heel Raises: 20 reps;Limitations Heel Raises Limitations: toe raises 20 reps Lateral Step Up: Left;15 reps;Step Height: 4" Forward Step Up: Left;15 reps;Step Height: 4" Step Down: Left;15 reps;Step Height: 4" Functional Squat: 20 reps Rocker Board: 2 minutes;Limitations Rocker Board Limitations: R/L with intermittent HHA SLS: R 5", L 8" max of 3 Other Standing Knee Exercises: tandem stance 2x 30" each foot in front     Physical Therapy Assessment and Plan PT Assessment and Plan Clinical Impression Statement: Began stair training, pt able to follow demonstration with vc-ing to reduce hip hiking wtih lateral step up.  Followed cues and completed without difficulty, noted quad fatigue with activity.  Began balance training, pt initially required HHA but following cues for spatial awareness was able to hold tandem stance 30" with no HHA.  Pt stated tightness reduce with stretch and pain reduced wtih ice at end of session. PT Plan: Continue with current POC, resume mat activities next session, include step training and increase ROM.    Goals    Problem List Patient Active Problem List  Diagnosis  .  HYPERTENSION  . ASTHMA, MILD  . GERD  . HIATAL HERNIA  . HEART MURMUR, SYSTOLIC  . OVERWEIGHT/OBESITY  . PUD (peptic ulcer disease)    PT - End of Session Activity Tolerance: Patient tolerated treatment well General Behavior During Session: [facility] for tasks performed Cognition: [facility] for tasks performed  GP    Juel Burrow 08/03/2012, 6:30 PM

## 2012-08-06 ENCOUNTER — Ambulatory Visit (INDEPENDENT_AMBULATORY_CARE_PROVIDER_SITE_OTHER): Payer: 59 | Admitting: Internal Medicine

## 2012-08-06 ENCOUNTER — Encounter (INDEPENDENT_AMBULATORY_CARE_PROVIDER_SITE_OTHER): Payer: Self-pay | Admitting: Internal Medicine

## 2012-08-06 VITALS — BP 130/70 | HR 76 | Temp 98.6°F | Resp 20 | Ht 63.0 in | Wt 193.3 lb

## 2012-08-06 DIAGNOSIS — Z8711 Personal history of peptic ulcer disease: Secondary | ICD-10-CM

## 2012-08-06 DIAGNOSIS — D649 Anemia, unspecified: Secondary | ICD-10-CM

## 2012-08-06 DIAGNOSIS — K219 Gastro-esophageal reflux disease without esophagitis: Secondary | ICD-10-CM

## 2012-08-06 MED ORDER — ESOMEPRAZOLE MAGNESIUM 40 MG PO CPDR: 40.0000 mg | DELAYED_RELEASE_CAPSULE | Freq: Every day | ORAL | Status: DC

## 2012-08-06 NOTE — Progress Notes (Addendum)
Presenting complaint;  Followup for peptic ulcer disease and GERD. Subjective:  Patient is 66 year old Caucasian female who has history of peptic ulcer disease and GERD. She was evaluated with EGD in October 2012 and found to have 2 prepyloric ulcers and a moderate size sliding hiatal hernia. H. pylori serology was negative. Her ulcers were felt to be secondary to NSAID therapy. She was doing well when last seen 6 months ago. She went on to have left knee replacement on 07/11/2012. She states she has had a rapid recovery and she is back on her feet. She is not taking any NSAIDs. She is still taking pain medication 3-4 times a day. She has had a few episodes of heartburn at night quickly relieved with Gaviscon. She denies nausea vomiting dysphagia abdominal pain or melena. She wants to know if she could go back on Fosamax.  Current Medications: Current Outpatient Prescriptions  Medication Sig Dispense Refill  . Cholecalciferol (VITAMIN D) 2000 UNITS CAPS Take 1 capsule by mouth daily.        . diazepam (VALIUM) 5 MG tablet Take 2.5 mg by mouth at bedtime.       Marland Kitchen esomeprazole (NEXIUM) 40 MG capsule Take 40 mg by mouth daily before breakfast.      . gabapentin (NEURONTIN) 300 MG capsule Take 300 mg by mouth at bedtime.        . hydrochlorothiazide (MICROZIDE) 12.5 MG capsule Take 12.5 mg by mouth daily.      . Hydrocodone-APAP-Dietary Prod (HYDROCODONE-APAP-NUTRIT SUPP) 10-325 MG MISC Take by mouth 4 (four) times daily.      . methocarbamol (ROBAXIN) 500 MG tablet Take 500 mg by mouth. Patient takes every 8 hours for spasms following knee surgery      . montelukast (SINGULAIR) 10 MG tablet Take 10 mg by mouth every morning.          Objective: Blood pressure 130/70, pulse 76, temperature 98.6 F (37 C), temperature source Oral, resp. rate 20, height 5\' 3"  (1.6 m), weight 193 lb 4.8 oz (87.68 kg). Patient is alert and in no acute distress. Conjunctiva is pink. Sclera is  nonicteric Oropharyngeal mucosa is normal. No neck masses or thyromegaly noted. Abdomen is full soft and nontender without organomegaly or masses. No LE edema or clubbing noted.  Labs/studies Results: H&H on 07/12/2012 was 9.9 and 31.5. H&H on 07/13/2012 was 9.5 and 29.2.  Assessment:  #1. Chronic GERD. She has moderate size sliding hiatal hernia and she is doing well with therapy. #2. History of peptic ulcer disease secondary to NSAID therapy. She is not taking NSAIDs. She does not have any symptoms suggest  recurrence of PUD. #3. Anemia. Suspect it is secondary to recent surgery. She may also have impaired iron absorption secondary to chronic acid suppression. Will need further workup if H&H does not come up with iron therapy.   Plan:  Continue Nexium at 40 mg by mouth every morning. Ferrous sulfate 325 mg by mouth daily. Can resume Fosamax. H&H in 6 weeks. Office visit in one year.

## 2012-08-06 NOTE — Patient Instructions (Signed)
You can resume Fosamax as recommended by Dr. Phillips Odor. Ferrous sulfate 325 mg by mouth daily. H&H in 6 weeks; office will call you

## 2012-08-07 ENCOUNTER — Ambulatory Visit (HOSPITAL_COMMUNITY)
Admission: RE | Admit: 2012-08-07 | Discharge: 2012-08-07 | Disposition: A | Discharge status: Home / Self Care | Payer: 59 | Source: Ambulatory Visit

## 2012-08-07 NOTE — Progress Notes (Signed)
Physical Therapy Treatment Patient Details  Name: ARYEL EDELEN MRN: [id number] Date of Birth: [birth year]  Today's Date: 08/07/2012 Time: 4098-1191 PT Time Calculation (min): 82 min  Visit#: 5  of 12   Re-eval: 08/25/12  Charge: therex 47, gait 10', ice 10'  Authorization: [employer]    Subjective: Symptoms/Limitations Symptoms: Knee is feeling fine today, no pain just tight today.  Pt reported completeing lots of housework yesterday, stated knee and ankle were very swollen last night.  She iced and elevated to reduce edema. Pain Assessment Currently in Pain?: No/denies  Objective:  Exercise/Treatments Stretches Quad Stretch: 3 reps;30 seconds Gastroc Stretch: 3 reps;30 seconds Aerobic Stationary Bike: 10' @ 3.0 full revolution for ROM seat 9 Standing Heel Raises: 20 reps;Limitations Heel Raises Limitations: toe raises 20 reps Terminal Knee Extension: Left;15 reps;Theraband Theraband Level (Terminal Knee Extension): Level 4 (Blue) Lateral Step Up: Left;15 reps;Step Height: 4" Forward Step Up: Left;15 reps;Step Height: 6" Step Down: Left;15 reps;Step Height: 4" Functional Squat: 20 reps;Limitations Functional Squat Limitations: proper lifting with orange ball from 8 in Stairs: reciprocal 1 RT with 1 HR Rocker Board: 2 minutes;Limitations Rocker Board Limitations: R/L with intermittent HHA Gait Training: gait training for heel to toe gait, equal stride length and knee flexion x 10 min Supine Quad Sets: 15 reps Short Arc Quad Sets: 15 reps;Limitations Short Arc Quad Sets Limitations: 3# Heel Slides: 15 reps Prone  Hip Extension: 15 reps;Limitations Hip Extension Limitations: 3# Prone Knee Hang: 2 minutes      Physical Therapy Assessment and Plan PT Assessment and Plan Clinical Impression Statement: Instructed prone knee hang to increase knee extension, pt encouraged to add to HEP.  Added SAQ to POC to improve quad strength and increase ROM.  Able to  increase height with step ups with good control noted, began stairwelll reciprrocal training with 1 HR, weak eccentric control noted while descending. PT Plan: Continue with current POC.    Goals    Problem List Patient Active Problem List  Diagnosis  . HYPERTENSION  . ASTHMA, MILD  . GERD  . HIATAL HERNIA  . HEART MURMUR, SYSTOLIC  . OVERWEIGHT/OBESITY  . PUD (peptic ulcer disease)  . Anemia    PT - End of Session Activity Tolerance: Patient tolerated treatment well General Behavior During Session: [facility] for tasks performed Cognition: [facility] for tasks performed  GP    Juel Burrow 08/07/2012, 6:07 PM

## 2012-08-09 ENCOUNTER — Ambulatory Visit (HOSPITAL_COMMUNITY)
Admission: RE | Admit: 2012-08-09 | Discharge: 2012-08-09 | Disposition: A | Discharge status: Home / Self Care | Payer: 59 | Source: Ambulatory Visit

## 2012-08-09 NOTE — Progress Notes (Signed)
Physical Therapy Treatment Patient Details  Name: [name] MRN: [id number] Date of Birth: 07-23-1946  Today's Date: 08/09/2012 Time: 9629-5284 PT Time Calculation (min): 68 min  Visit#: 6  of 12   Re-eval: 08/25/12  Charge: therex 44, gait 8 min, ice 10  Authorization: [employer]   Subjective: Symptoms/Limitations Symptoms: Had good workout last night.  Have been on feet alot today, husband had MD apt in [place] earlier today.  Pain scale 4/10 L knee.  Pt reported completeing the prone knee hang, able to tolerate for 2 minutes at home.  Pain Assessment Currently in Pain?: Yes Pain Score:   4 Pain Location: Knee Pain Orientation: Left  Objective:   Exercise/Treatments Stretches Active Hamstring Stretch: 3 reps;30 seconds Aerobic Stationary Bike: 6' @ 3.0 seat 8 Standing Terminal Knee Extension: Left;15 reps;Theraband;Limitations Theraband Level (Terminal Knee Extension): Level 4 (Blue) Terminal Knee Extension Limitations: 10 sec holds Lateral Step Up: 20 reps;Hand Hold: 1;Step Height: 4" Forward Step Up: 20 reps;Hand Hold: 1;Step Height: 6" Step Down: 20 reps;Hand Hold: 1;Step Height: 4" Functional Squat: 20 reps;Limitations Functional Squat Limitations: proper lifting with orange ball from 8 in Stairs: reciprocal 1 RT with 1 HR Seated Long Arc Quad: 15 reps;Weights;Limitations Long Arc Quad Weight: 3 lbs. Long Arc [employer] Limitations: increase weight next session Supine Quad Sets: 15 reps;Limitations Quad Sets Limitations: with heel slides Short Arc Quad Sets: 15 reps;Limitations Short Arc Quad Sets Limitations: 3# Heel Slides: 15 reps Bridges: Limitations Bridges Limitations: next session Patellar Mobs: Done R/L, A/P; Tib/fib Knee Extension: PROM;3 sets Knee Flexion: PROM;3 sets Sidelying Hip ABduction: Limitations Hip ABduction Limitations: begin next session  Modalities Modalities: Cryotherapy Manual Therapy Manual Therapy: Joint  mobilization Joint Mobilization: Patella mobilization x 5' to increase ROM and decrease pain Cryotherapy Number Minutes Cryotherapy: 10 Minutes Cryotherapy Location: Knee Type of Cryotherapy: Ice pack  Physical Therapy Assessment and Plan PT Assessment and Plan Clinical Impression Statement: Pt improving overall functional strengthening and AROM increaseing.  Good form noted with squats today, no cueing required.  Weak eccentric control still noted descending stairs.  Manual patella mobs complete to increase ROM and decrease pain.  PT Plan: Next session begin chair st for increase flexion, begin supine bridges for hip extension strengthening and S/L abduction for glut med strengthening.    Goals    Problem List Patient Active Problem List  Diagnosis  . HYPERTENSION  . ASTHMA, MILD  . GERD  . HIATAL HERNIA  . HEART MURMUR, SYSTOLIC  . OVERWEIGHT/OBESITY  . PUD (peptic ulcer disease)  . Anemia    PT - End of Session Activity Tolerance: Patient tolerated treatment well General Behavior During Session: [facility] for tasks performed Cognition: [facility] for tasks performed  GP    Juel Burrow 08/09/2012, 6:40 PM

## 2012-08-10 ENCOUNTER — Ambulatory Visit (HOSPITAL_COMMUNITY): Payer: 59

## 2012-08-14 ENCOUNTER — Telehealth (HOSPITAL_COMMUNITY): Payer: Self-pay

## 2012-08-14 ENCOUNTER — Ambulatory Visit (HOSPITAL_COMMUNITY)
Admission: RE | Admit: 2012-08-14 | Discharge: 2012-08-14 | Disposition: A | Discharge status: Home / Self Care | Payer: 59 | Source: Ambulatory Visit

## 2012-08-14 NOTE — Progress Notes (Signed)
Physical Therapy Treatment Patient Details  Name: [name] MRN: [id number] Date of Birth: [birth year]  Today's Date: 08/14/2012 Time: 1700-1755 PT Time Calculation (min): 55 min  Visit#: 7  of 12   Re-eval: 08/25/12 Assessment Diagnosis: L TKR Next MD Visit: 08/21/2012 Charge: therex 39', ice 10  Authorization: [employer]   Subjective: Symptoms/Limitations Symptoms: Pt feeling good today, L knee pain scale 2-3/10 today.  Pt reported able to tolerate prone knee hang for 2 to 3 minutes tops. Pain Assessment Currently in Pain?: Yes Pain Score:   2 Pain Location: Knee Pain Orientation: Left  Objective:   Exercise/Treatments Stretches Active Hamstring Stretch: 3 reps;30 seconds Quad Stretch: 3 reps;30 seconds Knee: Self-Stretch to increase Flexion: 3 reps;30 seconds;Limitations Knee: Self-Stretch Limitations: chair stretch Gastroc Stretch: 3 reps;30 seconds (slant board) Aerobic Stationary Bike: 6' @ 3.0 seat 8 Standing Terminal Knee Extension: Left;15 reps;Theraband;Limitations Theraband Level (Terminal Knee Extension): Level 4 (Blue) Terminal Knee Extension Limitations: 10 sec holds Lateral Step Up: Left;20 reps;Hand Hold: 1;Step Height: 6" Forward Step Up: 20 reps;Hand Hold: 1;Step Height: 6" Step Down: Left;15 reps;Hand Hold: 1;Step Height: 6" Functional Squat: 20 reps;Limitations Functional Squat Limitations: proper lifting with orange ball from 8 in Seated Long Arc Quad: 15 reps;Weights;Limitations Long Arc Quad Weight: 5 lbs. Long [employer] Limitations: 5" holds Supine Heel Slides: 15 reps Bridges: 15 reps Knee Extension: PROM;3 sets Knee Flexion: PROM;3 sets Sidelying Hip ABduction: 15 reps;Left Clams: 10x 10" with NMR for glut med muscle activation Prone  Hip Extension: 15 reps;Limitations Hip Extension Limitations: 3#      Physical Therapy Assessment and Plan PT Assessment and Plan Clinical Impression Statement: Added new stretch and S/L  glut med strengthening without difficulty.  ROM improving, pt able to achieve AROM 115 flexion following manual and stretches.  Able to increase height with step ups with good form noted.  Weak eccentric control still noted descending step. PT Plan: Continue with current POC, increase ROM and strengthening.  Reassess in 2 more sessions prior MD apt.    Goals    Problem List Patient Active Problem List  Diagnosis  . HYPERTENSION  . ASTHMA, MILD  . GERD  . HIATAL HERNIA  . HEART MURMUR, SYSTOLIC  . OVERWEIGHT/OBESITY  . PUD (peptic ulcer disease)  . Anemia    PT - End of Session Activity Tolerance: Patient tolerated treatment well General Behavior During Session: [facility] for tasks performed Cognition: [facility] for tasks performed  GP    Juel Burrow 08/14/2012, 5:51 PM

## 2012-08-16 ENCOUNTER — Ambulatory Visit (HOSPITAL_COMMUNITY)
Admission: RE | Admit: 2012-08-16 | Discharge: 2012-08-16 | Disposition: A | Discharge status: Home / Self Care | Payer: 59 | Source: Ambulatory Visit

## 2012-08-16 NOTE — Progress Notes (Signed)
Physical Therapy Treatment Patient Details  Name: [name] MRN: [id number] Date of Birth: 02/14/1946  Today's Date: 08/16/2012 Time: 8295-6213 PT Time Calculation (min): 60 min  Visit#: 8  of 12   Re-eval: 08/25/12  Charge: therex 44', ice 10'  Authorization: [employer]   Subjective: Symptoms/Limitations Symptoms: Pt stated L knee stiff today, pain scale 3/10.  Pt reported decrease in pain meds, have only taken 1 all day.  Pt hoping she will be given permission to drive at MD apt next Tuesday. Pain Assessment Currently in Pain?: Yes Pain Score:   3 Pain Location: Knee Pain Orientation: Left  Objective:   Exercise/Treatments Stretches Active Hamstring Stretch: 3 reps;30 seconds Gastroc Stretch: 3 reps;30 seconds Aerobic Stationary Bike: 6' @ 3.0 seat 8 Machines for Strengthening Cybex Knee Extension: 2 PL BLE 15 reps Cybex Knee Flexion: 3 PL BLE 15 reps Standing Terminal Knee Extension: 20 reps;Theraband;Limitations Theraband Level (Terminal Knee Extension): Level 4 (Blue) Terminal Knee Extension Limitations: 10 sec holds Lateral Step Up: Left;20 reps;Hand Hold: 1;Step Height: 6" Forward Step Up: 20 reps;Hand Hold: 1;Step Height: 6" Step Down: Left;15 reps;Hand Hold: 1;Step Height: 6" Wall Squat: 10 reps;10 seconds Supine Heel Slides: 10 reps;Limitations Heel Slides Limitations: 5-10 sec holds  Bridges: 20 reps;Limitations Bridges Limitations: with ball between knees  Knee Extension: PROM;3 sets Knee Flexion: PROM;3 sets Sidelying Hip ABduction: 20 reps Clams: 15 x 10" with NMR for glut med muscle activation  Modalities Modalities: Cryotherapy Cryotherapy Number Minutes Cryotherapy: 10 Minutes Cryotherapy Location: Knee Type of Cryotherapy: Ice pack  Physical Therapy Assessment and Plan PT Assessment and Plan Clinical Impression Statement: Strength improving, able to progress therex to cybex machines and wall squats for increase quad and glut  strengthening.  Pt able to complete withut difficulty following demonstration and cues for control and form.  Noted increase quad fatigue with new activities.   PT Plan: Re-eval next session prior MD apt.    Goals    Problem List Patient Active Problem List  Diagnosis  . HYPERTENSION  . ASTHMA, MILD  . GERD  . HIATAL HERNIA  . HEART MURMUR, SYSTOLIC  . OVERWEIGHT/OBESITY  . PUD (peptic ulcer disease)  . Anemia    PT - End of Session Activity Tolerance: Patient tolerated treatment well General Behavior During Session: [facility] for tasks performed Cognition: [facility] for tasks performed  GP    Juel Burrow 08/16/2012, 5:38 PM

## 2012-08-17 ENCOUNTER — Ambulatory Visit (HOSPITAL_COMMUNITY)
Admission: RE | Admit: 2012-08-17 | Discharge: 2012-08-17 | Disposition: A | Discharge status: Home / Self Care | Payer: 59 | Source: Ambulatory Visit | Attending: Orthopedic Surgery | Admitting: Orthopedic Surgery

## 2012-08-17 NOTE — Progress Notes (Signed)
Physical Therapy Re-evaluation  Patient Details  Name: [name] MRN: [id number] Date of Birth: Apr 01, 1946  Today's Date: 08/17/2012 Time: 8469-6295 PT Time Calculation (min): 54 min  Visit#: 9  of 12   Re-eval: 08/25/12 Assessment Diagnosis: L TKR Next MD Visit: 08/21/2012 Charge: MMT x 1 unit, ROM Measurement x 1 unit, therex 38', ice 10'  Authorization: [employer]   Subjective Symptoms/Limitations Symptoms: Pt reported she had to take another pain meds last night, following PT session she had to sit in meeting for 3 hours.  Hasnt taken pain meds since 900 this AM, its currents 1530.  Pain scale 3/10 today. How long can you sit comfortably?: Pt able to sit comfortably for 2 hours without difficulty. How long can you stand comfortably?: Pt able to stand 20-25 minutes to complete housework. How long can you walk comfortably?: Pt ambulating with no AD with proper gait mechanics for 20 minutes. Pain Assessment Currently in Pain?: Yes Pain Score:   3 Pain Location: Knee Pain Orientation: Left  Sensation/Coordination/Flexibility/Functional Tests Functional Tests Functional Tests: LEFS 71/80 (was 12/80)  Assessment LLE AROM (degrees) Left Knee Extension: 3  Left Knee Flexion: 113  LLE PROM (degrees) Left Knee Extension: 0 Left Knee Flexion: 118 LLE Strength Left Hip Flexion: 5/5 Left Hip Extension: 4/5 (was 3-/5) Left Hip ABduction: 4/5 (was 3/5) Left Hip ADduction:  (4+/5 was 4/5) Left Knee Flexion: 5/5 Left Knee Extension: 5/5 (was 3/5) Left Ankle Dorsiflexion: 5/5  Exercise/Treatments Stretches Active Hamstring Stretch: 3 reps;30 seconds Quad Stretch: 3 reps;30 seconds Aerobic Stationary Bike: 8' @ 4.0 Machines for Strengthening Cybex Knee Extension: 2 PL BLE 15 reps Standing Terminal Knee Extension: 20 reps;Theraband;Limitations Theraband Level (Terminal Knee Extension): Level 4 (Blue) Terminal Knee Extension Limitations: 10 sec holds Supine Heel  Slides: 10 reps;Limitations Heel Slides Limitations: 5-10 sec holds  Knee Extension: PROM;3 sets Knee Flexion: PROM;3 sets  Physical Therapy Assessment and Plan PT Assessment and Plan Clinical Impression Statement: Re-eval complete prior MD apt next week with the following results:  [name] has had 9 OPPT session and has met 2/3 STGs and 4/7 LTGs.  Pt's AROM has increased to 3-113 and PROM 0-118, strength has improved by 1/2 to1 grade to allow pt to ambulate with no AD, ability to ascend and descend stairs reciprocally with handrail.  Pt has increased her perceived functional abilities (LEFS to 71/80) with ability to sit comfortably for 2 hours, stand for 20-25 to complete housework, and walk for 20 minutes.  Average pain scale is 3/10 80% of day. PT Plan: Recommend continuing OPPT for another 2 weeks to increase ROM, improve strength in gluteal region/ adductors and improve pt.'s functional strength with ability to sit, stand and walk for increased time without pain.    Goals Home Exercise Program Pt will Perform Home Exercise Program: Independently PT Goal: Perform Home Exercise Program - Progress: Met PT Short Term Goals Time to Complete Short Term Goals: 2 weeks PT Short Term Goal 1: ROM to be 0-110 to allow normalized gt PT Short Term Goal 1 - Progress: Progressing toward goal PT Short Term Goal 2: Pt to be using a cane inside and outside the house PT Short Term Goal 2 - Progress: Met PT Short Term Goal 3: Pt strength to be increased 1/2 grade PT Short Term Goal 3 - Progress: Met PT Long Term Goals Time to Complete Long Term Goals: 4 weeks PT Long Term Goal 1: ROM to be to 120 to allow squatting  activity. PT Long Term Goal 1 - Progress: Progressing toward goal PT Long Term Goal 2: Pt to be able to sit for an hour and a half for watching a movie or traveling PT Long Term Goal 2 - Progress: Met Long Term Goal 3: mm strength to be increased by one grade to allow walking without  AD Long Term Goal 3 Progress: Met Long Term Goal 4: Pt to be able to ascend and descend steps reciprocally using hand rail Long Term Goal 4 Progress: Met PT Long Term Goal 5: Pt pain to be no greater than a 2 80% of the day Long Term Goal 5 Progress: Progressing toward goal Additional PT Long Term Goals?: Yes PT Long Term Goal 6: LEFS to impove by 10 points  Long Term Goal 6 Progress: Met PT Long Term Goal 7: Pt to be ambulating at least 30 minutes a day. Long Term Goal 7 Progress: Progressing toward goal  Problem List Patient Active Problem List  Diagnosis  . HYPERTENSION  . ASTHMA, MILD  . GERD  . HIATAL HERNIA  . HEART MURMUR, SYSTOLIC  . OVERWEIGHT/OBESITY  . PUD (peptic ulcer disease)  . Anemia    PT - End of Session Activity Tolerance: Patient tolerated treatment well General Behavior During Session: [facility] for tasks performed Cognition: [facility] for tasks performed  GP    Juel Burrow 08/17/2012, 5:10 PM

## 2012-08-21 ENCOUNTER — Inpatient Hospital Stay (HOSPITAL_COMMUNITY): Admission: RE | Admit: 2012-08-21 | Payer: 59 | Source: Ambulatory Visit

## 2012-08-23 ENCOUNTER — Ambulatory Visit (HOSPITAL_COMMUNITY)
Admission: RE | Admit: 2012-08-23 | Discharge: 2012-08-23 | Disposition: A | Discharge status: Home / Self Care | Payer: 59 | Source: Ambulatory Visit

## 2012-08-23 NOTE — Progress Notes (Signed)
Physical Therapy Treatment Patient Details  Name: [name] MRN: [id number] Date of Birth: 1946/01/25  Today's Date: 08/23/2012 Time: 1730-1830 PT Time Calculation (min): 60 min  Visit#: 10  of 12   Re-eval: 08/30/12  Charge: therex 42', ice 10  Authorization: [employer]   Subjective: Symptoms/Limitations Symptoms: Pt reported MD happy with progress, has been released to drive.  Pt been out in yard a lot today with grandson.  Has not taken pain meds since 1100 this am with pain scale 2/10 today. Pain Assessment Currently in Pain?: Yes Pain Score:   2 Pain Location: Knee Pain Orientation: Left  Objective:   Exercise/Treatments Stretches Active Hamstring Stretch: 3 reps;30 seconds Quad Stretch: 3 reps;30 seconds Aerobic Stationary Bike: 8' @ 4.0 Elliptical: begin next session Standing Terminal Knee Extension: 20 reps;Theraband;Limitations Theraband Level (Terminal Knee Extension): Level 4 (Blue) Terminal Knee Extension Limitations: 10 sec holds Wall Squat: 5 reps;10 seconds;Limitations Wall Squat Limitations: with soccer ball between knees, stopped early R knee popping Supine Heel Slides: 10 reps;Limitations Heel Slides Limitations: 5-10 sec holds  Bridges: 20 reps;Limitations Bridges Limitations: with ball between knees  Knee Extension: PROM;2 sets Knee Flexion: PROM;2 sets Sidelying Hip ABduction: 15 reps;Limitations Hip ABduction Limitations: 5# Hip ADduction: 15 reps Clams: 15x 10" Prone  Hip Extension: 15 reps;Limitations Hip Extension Limitations: 5# Other Prone Exercises: Heel squeeze for adduction and gluteal strengthening 10x 5"   Physical Therapy Assessment and Plan PT Assessment and Plan Clinical Impression Statement: Pt progressing well towards all goals.  Session focus on ROM and gluteal strengthening.  Able to increase bike seat closer for increased flexion as well as increase weight with mat activities. PT Plan: Continue for 2 more  sessions, check ROM and MMT then D/C to HEP.  Begin elliptical next session for increased activity tolerance.    Goals    Problem List Patient Active Problem List  Diagnosis  . HYPERTENSION  . ASTHMA, MILD  . GERD  . HIATAL HERNIA  . HEART MURMUR, SYSTOLIC  . OVERWEIGHT/OBESITY  . PUD (peptic ulcer disease)  . Anemia    PT - End of Session Activity Tolerance: Patient tolerated treatment well General Behavior During Session: [facility] for tasks performed Cognition: [facility] for tasks performed  GP    Juel Burrow 08/23/2012, 6:21 PM

## 2012-08-24 ENCOUNTER — Ambulatory Visit (HOSPITAL_COMMUNITY)
Admission: RE | Admit: 2012-08-24 | Discharge: 2012-08-24 | Disposition: A | Discharge status: Home / Self Care | Payer: 59 | Source: Ambulatory Visit | Attending: Family Medicine | Admitting: Family Medicine

## 2012-08-24 DIAGNOSIS — R269 Unspecified abnormalities of gait and mobility: Secondary | ICD-10-CM | POA: Insufficient documentation

## 2012-08-24 DIAGNOSIS — M25569 Pain in unspecified knee: Secondary | ICD-10-CM | POA: Insufficient documentation

## 2012-08-24 DIAGNOSIS — IMO0001 Reserved for inherently not codable concepts without codable children: Secondary | ICD-10-CM | POA: Insufficient documentation

## 2012-08-24 DIAGNOSIS — M6281 Muscle weakness (generalized): Secondary | ICD-10-CM | POA: Insufficient documentation

## 2012-08-24 DIAGNOSIS — M25669 Stiffness of unspecified knee, not elsewhere classified: Secondary | ICD-10-CM | POA: Insufficient documentation

## 2012-08-24 NOTE — Progress Notes (Signed)
Physical Therapy Treatment Patient Details  Name: [name] MRN: [id number] Date of Birth: 04-Aug-1946  Today's Date: 08/24/2012 Time: 7829-5621 PT Time Calculation (min): 59 min  Visit#: 11  of 12   Re-eval: 08/30/12  Charge: therex 45 Ice 10  Authorization: [employer]   Subjective: Symptoms/Limitations Symptoms: Pt reported R LE pain following the wall squats last session, L knee is feeling good maybe 2/10 pain scale. Pain Assessment Currently in Pain?: Yes Pain Score:   2 Pain Location: Knee Pain Orientation: Left  Objective:   Exercise/Treatments Stretches Active Hamstring Stretch: 3 reps;30 seconds Gastroc Stretch: 3 reps;30 seconds Aerobic Elliptical: 5' @ L1 Standing Terminal Knee Extension: 20 reps;Theraband;Limitations Theraband Level (Terminal Knee Extension): Level 4 (Blue) Terminal Knee Extension Limitations: 10 sec holds Functional Squat: 10 reps;Limitations Functional Squat Limitations: proper lifting with orange ball from 8 in then shoulder flexion with heel raise Supine Heel Slides: 10 reps;Limitations Heel Slides Limitations: 5-10 sec holds  Bridges: 20 reps;Limitations Bridges Limitations: with ball between knees  Patellar Mobs: Done R/L, A/P; Tib/fib Knee Extension: PROM;2 sets Knee Flexion: PROM;2 sets Sidelying Clams: 15x 10" Prone  Other Prone Exercises: Heel squeeze for adduction and gluteal strengthening 10x 5"    Modalities Modalities: Cryotherapy Cryotherapy Number Minutes Cryotherapy: 10 Minutes Cryotherapy Location: Knee Type of Cryotherapy: Ice pack  Physical Therapy Assessment and Plan PT Assessment and Plan Clinical Impression Statement: Changed aerobics to elliptical for increased activity tolerance.  Pt tolerated well towards total treatment with 1 rest break required.  Min vc's for proper technique with functional squats this session to bend knees not back to reduce risk of injury. PT Plan: Re-eval next week check  ROM and MMT then D/C to HEP.  Need g-codes.    Goals    Problem List Patient Active Problem List  Diagnosis  . HYPERTENSION  . ASTHMA, MILD  . GERD  . HIATAL HERNIA  . HEART MURMUR, SYSTOLIC  . OVERWEIGHT/OBESITY  . PUD (peptic ulcer disease)  . Anemia    PT - End of Session Activity Tolerance: Patient tolerated treatment well General Behavior During Session: [facility] for tasks performed Cognition: [facility] for tasks performed  GP    Juel Burrow 08/24/2012, 6:32 PM

## 2012-08-28 ENCOUNTER — Ambulatory Visit (HOSPITAL_COMMUNITY)
Admission: RE | Admit: 2012-08-28 | Discharge: 2012-08-28 | Disposition: A | Discharge status: Home / Self Care | Payer: 59 | Source: Ambulatory Visit | Attending: Family Medicine | Admitting: Family Medicine

## 2012-08-28 DIAGNOSIS — M25569 Pain in unspecified knee: Secondary | ICD-10-CM | POA: Insufficient documentation

## 2012-08-28 DIAGNOSIS — M25669 Stiffness of unspecified knee, not elsewhere classified: Secondary | ICD-10-CM | POA: Insufficient documentation

## 2012-08-28 DIAGNOSIS — M6281 Muscle weakness (generalized): Secondary | ICD-10-CM | POA: Insufficient documentation

## 2012-08-28 DIAGNOSIS — IMO0001 Reserved for inherently not codable concepts without codable children: Secondary | ICD-10-CM | POA: Insufficient documentation

## 2012-08-28 DIAGNOSIS — R269 Unspecified abnormalities of gait and mobility: Secondary | ICD-10-CM | POA: Insufficient documentation

## 2012-08-28 NOTE — Progress Notes (Signed)
Physical Therapy Treatment Patient Details  Name: [name] MRN: [id number] Date of Birth: Feb 28, 1946  Today's Date: 08/28/2012 Time: 1191-4782 PT Time Calculation (min): 55 min  Visit#: 12  of 15   Re-eval: 08/30/12 Assessment Diagnosis: L TKR Next MD Visit: 10/02/2012  Authorization: [place] medicare   Subjective: Symptoms/Limitations Symptoms: Pt states that she is doing well. Pain Assessment Currently in Pain?: Yes Pain Score:   2 Pain Location: Knee Pain Orientation: Left  Objective:   Exercise/Treatments Stretches Active Hamstring Stretch: 3 reps;30 seconds Gastroc Stretch: 3 reps;30 seconds Aerobic Elliptical: 5' @ L1 Standing Terminal Knee Extension: 20 reps;Theraband;Limitations Theraband Level (Terminal Knee Extension): Level 4 (Blue) Terminal Knee Extension Limitations: 10 sec holds Functional Squat: 15 reps Functional Squat Limitations: proper lifting with orange ball from 8 in then shoulder flexion with heel raise Stairs: reciprocal 1 RT with 1 HR Supine Heel Slides: 10 reps;Limitations Heel Slides Limitations: 10 sec holds Bridges: 20 reps;Limitations Bridges Limitations: with ball between knees  Patellar Mobs: Done R/L, A/P; Tib/fib Knee Extension: PROM;2 sets Knee Flexion: PROM;2 sets    Physical Therapy Assessment and Plan PT Assessment and Plan Clinical Impression Statement: Today's session began by Seth Bake, PTA.  Pt continues to make good gains with ROM both directions.  AROM 2-114, PROM 0-122.  Improved mechanics noted with functional squats, less cueing required. PT Plan: Re-eval next session.    Goals    Problem List Patient Active Problem List  Diagnosis  . HYPERTENSION  . ASTHMA, MILD  . GERD  . HIATAL HERNIA  . HEART MURMUR, SYSTOLIC  . OVERWEIGHT/OBESITY  . PUD (peptic ulcer disease)  . Anemia    PT - End of Session Activity Tolerance: Patient tolerated treatment well General Behavior During Session: [facility] for  tasks performed Cognition: [facility] for tasks performed  GP    Juel Burrow 08/28/2012, 6:33 PM

## 2012-08-30 ENCOUNTER — Ambulatory Visit (HOSPITAL_COMMUNITY)
Admission: RE | Admit: 2012-08-30 | Discharge: 2012-08-30 | Disposition: A | Discharge status: Home / Self Care | Payer: 59 | Source: Ambulatory Visit | Attending: Family Medicine | Admitting: Family Medicine

## 2012-08-30 NOTE — Progress Notes (Addendum)
Physical Therapy Treatment Patient Details  Name: [name] MRN: [id number] Date of Birth: 02/01/1946  Today's Date: 08/30/2012 Time: 4098-1191 PT Time Calculation (min): 45 min  Visit#: 13  of 16   Re-eval:       Subjective: Symptoms/Limitations Symptoms: Pt states no real pain she just knows it's there.  Pt states she is ready for discharge. Pain Assessment Pain Score: 0-No pain Pain Location: Knee   Exercise/Treatments   Aerobic Elliptical: 5'@L1     Standing Functional Squat Limitations: lift off 6"step onto toes. Gait Training: gt outside ramps/grass steps with no hand hold   Supine Quad Sets: 10 reps Terminal Knee Extension: 10 reps Knee Extension: PROM Sidelying   Prone  Prone Knee Hang: 5 minutes;Weights Prone Knee Hang Weights (lbs): 3#   Manual Therapy Massage: w/ myofascial to hamstrings.  Physical Therapy Assessment and Plan PT Assessment and Plan Clinical Impression Statement: Pt ROM improved significantly ROM prior to stretching and passive  -8 to 120.  Focused on extension and active ROM is at -1.  Did not focus of flexion this treatment due to having 120 without stretching.  Pt having no functional limitations ready for discharge PT Plan: discharge pt    Goals  met  Problem List Patient Active Problem List  Diagnosis  . HYPERTENSION  . ASTHMA, MILD  . GERD  . HIATAL HERNIA  . HEART MURMUR, SYSTOLIC  . OVERWEIGHT/OBESITY  . PUD (peptic ulcer disease)  . Anemia    PT - End of Session Activity Tolerance: Patient tolerated treatment well PT Plan of Care PT Home Exercise Plan: new HEP given focusing on extension Consulted and Agree with Plan of Care: Patient  GP    RUSSELL,CINDY 08/30/2012, 4:37 PM

## 2012-11-12 ENCOUNTER — Encounter (INDEPENDENT_AMBULATORY_CARE_PROVIDER_SITE_OTHER): Payer: Self-pay

## 2012-11-28 ENCOUNTER — Encounter (INDEPENDENT_AMBULATORY_CARE_PROVIDER_SITE_OTHER): Payer: Self-pay

## 2012-12-24 ENCOUNTER — Other Ambulatory Visit (HOSPITAL_COMMUNITY): Payer: Self-pay | Admitting: Family Medicine

## 2012-12-24 DIAGNOSIS — Z139 Encounter for screening, unspecified: Secondary | ICD-10-CM

## 2012-12-27 ENCOUNTER — Ambulatory Visit (HOSPITAL_COMMUNITY): Payer: 59

## 2012-12-27 ENCOUNTER — Ambulatory Visit (HOSPITAL_COMMUNITY)
Admission: RE | Admit: 2012-12-27 | Discharge: 2012-12-27 | Disposition: A | Discharge status: Home / Self Care | Payer: 59 | Source: Ambulatory Visit | Attending: Family Medicine | Admitting: Family Medicine

## 2012-12-27 DIAGNOSIS — Z1231 Encounter for screening mammogram for malignant neoplasm of breast: Secondary | ICD-10-CM | POA: Insufficient documentation

## 2012-12-27 DIAGNOSIS — Z139 Encounter for screening, unspecified: Secondary | ICD-10-CM

## 2013-04-22 ENCOUNTER — Other Ambulatory Visit: Payer: Self-pay | Admitting: Physical Medicine and Rehabilitation

## 2013-04-22 DIAGNOSIS — M7122 Synovial cyst of popliteal space [Baker], left knee: Secondary | ICD-10-CM

## 2013-04-23 ENCOUNTER — Ambulatory Visit
Admission: RE | Admit: 2013-04-23 | Discharge: 2013-04-23 | Disposition: A | Discharge status: Home / Self Care | Payer: 59 | Source: Ambulatory Visit | Attending: Physical Medicine and Rehabilitation | Admitting: Physical Medicine and Rehabilitation

## 2013-04-23 ENCOUNTER — Other Ambulatory Visit: Payer: Self-pay | Admitting: Physical Medicine and Rehabilitation

## 2013-04-23 DIAGNOSIS — M7122 Synovial cyst of popliteal space [Baker], left knee: Secondary | ICD-10-CM

## 2013-05-07 ENCOUNTER — Ambulatory Visit (HOSPITAL_COMMUNITY)
Admission: RE | Admit: 2013-05-07 | Discharge: 2013-05-07 | Disposition: A | Discharge status: Home / Self Care | Payer: 59 | Source: Ambulatory Visit | Attending: Physical Medicine and Rehabilitation | Admitting: Physical Medicine and Rehabilitation

## 2013-05-07 DIAGNOSIS — M533 Sacrococcygeal disorders, not elsewhere classified: Secondary | ICD-10-CM | POA: Insufficient documentation

## 2013-05-07 DIAGNOSIS — IMO0001 Reserved for inherently not codable concepts without codable children: Secondary | ICD-10-CM | POA: Insufficient documentation
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
## 2013-05-09 ENCOUNTER — Ambulatory Visit (HOSPITAL_COMMUNITY)
Admission: RE | Admit: 2013-05-09 | Discharge: 2013-05-09 | Disposition: A | Discharge status: Home / Self Care | Payer: 59 | Source: Ambulatory Visit | Attending: Physical Medicine and Rehabilitation | Admitting: Physical Medicine and Rehabilitation

## 2013-05-09 DIAGNOSIS — M533 Sacrococcygeal disorders, not elsewhere classified: Secondary | ICD-10-CM

## 2013-05-09 NOTE — Progress Notes (Signed)
Physical Therapy Treatment Patient Details  Name: [name] MRN: [id number] Date of Birth: 03-21-1946  Today's Date: 05/09/2013 Time: 0940-1020 PT Time Calculation (min): 40 min Charge: Manual 15', Therex 25  Visit#: 2 of 8  Re-eval: 06/06/13    Subjective: Symptoms/Limitations Symptoms: Pt stated her Rt side is hurting today, she helped out her husband mowing grass yesterday.   Pain Assessment Currently in Pain?: Yes Pain Score:   4 Pain Location: Flank Pain Orientation: Right  Objective:   Exercise/Treatments Stretches Active Hamstring Stretch: 3 reps;30 seconds Piriformis Stretch: 2 reps;30 seconds;Limitations Piriformis Stretch Limitations: Bil LE supine  Supine Ab Set: 5 reps;Limitations AB Set Limitations: 10 seconds Bridge: 10 reps  Manual Therapy Manual Therapy: Other (comment) Myofascial Release: quick fascial pulls to coccyx with STM to lt gltueal region Other Manual Therapy: MET pubic clearing for SI alignment  Physical Therapy Assessment and Plan PT Assessment and Plan Clinical Impression Statement: SI within alignment, did complete pubic clearing to assure alignment.  Session focus on education for proper posture in seated and standing position.  Therex for core strengthening with tactile cueing for correct musculature activation.  Manual STM and joint mobs complete to improve alignment and reduce pain.   PT Plan: Check SI alignment, continue with manual techniques and modalities (estim vs. [place]) to reduce pain. instruct on transverse abdominous exercises.  Progress to internal pelvic floor exam and muscle release.     Goals    Problem List Patient Active Problem List   Diagnosis Date Noted  . Coccydynia 05/07/2013  . Anemia 08/06/2012  . PUD (peptic ulcer disease) 02/06/2012  . OVERWEIGHT/OBESITY 12/21/2010  . HYPERTENSION 12/29/2009  . 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 GERD 12/29/2009  . HIATAL HERNIA 12/29/2009  . HEART MURMUR, SYSTOLIC 12/29/2009     PT - End of Session Activity Tolerance: Patient tolerated treatment well General Behavior During Therapy: WFL for tasks assessed/performed Cognition: WFL for tasks performed  GP    Juel Burrow 05/09/2013, 1:40 PM

## 2013-05-13 ENCOUNTER — Ambulatory Visit (HOSPITAL_COMMUNITY)
Admission: RE | Admit: 2013-05-13 | Discharge: 2013-05-13 | Disposition: A | Discharge status: Home / Self Care | Payer: 59 | Source: Ambulatory Visit | Attending: Family Medicine | Admitting: Family Medicine

## 2013-05-13 ENCOUNTER — Other Ambulatory Visit (HOSPITAL_COMMUNITY): Payer: Self-pay | Admitting: Family Medicine

## 2013-05-13 DIAGNOSIS — R079 Chest pain, unspecified: Secondary | ICD-10-CM

## 2013-05-14 ENCOUNTER — Ambulatory Visit (HOSPITAL_COMMUNITY)
Admission: RE | Admit: 2013-05-14 | Discharge: 2013-05-14 | Disposition: A | Discharge status: Home / Self Care | Payer: 59 | Source: Ambulatory Visit | Attending: Physical Medicine and Rehabilitation | Admitting: Physical Medicine and Rehabilitation

## 2013-05-14 DIAGNOSIS — M533 Sacrococcygeal disorders, not elsewhere classified: Secondary | ICD-10-CM

## 2013-05-14 NOTE — Progress Notes (Signed)
Physical Therapy Treatment Patient Details  Name: [name] MRN: [id number] Date of Birth: 1946/09/15  Today's Date: 05/14/2013 Time: 6213-0865 PT Time Calculation (min): 38 min Charge: Therex 30' Manual 8'  Visit#: 3 of 8  Re-eval: 06/06/13   Subjective: Symptoms/Limitations Symptoms: Pt reported pain reduced following last session for a couple of days.  Pain behind left knee 4/10 pain scale.   Pain Assessment Currently in Pain?: Yes Pain Score:   4 Pain Location: Knee Pain Orientation: Left;Posterior  Objective:   Exercise/Treatments Stretches Active Hamstring Stretch: 3 reps;30 seconds Piriformis Stretch: 2 reps;30 seconds;Limitations Piriformis Stretch Limitations: Bil LE supine  Seated Other Seated Lumbar Exercises: Anterior/posterior hip rotation Other Seated Lumbar Exercises: posture awareness Supine Ab Set: 10 reps;Limitations AB Set Limitations: 10 seconds Bent Knee Raise: 10 reps;5 seconds;Limitations Bent Knee Raise Limitations: with TrA activation Bridge: 20 reps Prone  Other Prone Lumbar Exercises: Multifidus 5x 10"  Manual Therapy Manual Therapy: Joint mobilization Joint Mobilization: patella mobs all directions and tib/fib mobs for pain control. Other Manual Therapy: Check for SI alignment  Physical Therapy Assessment and Plan PT Assessment and Plan Clinical Impression Statement: SI within alignement, no MET required.  Session focus on core musculature activation and progressed exercises with TrA activation.  Added multifidus for stabilty strengthening with multimodal cuieng required for correct activation.  Joint mobs complete to Lt knee for pain control.  Pt reported pain free at end of session.   PT Plan: Check SI alignment, continue with manual techniques and modalities (estim vs. [place]) to reduce pain. instruct on transverse abdominous exercises.  Progress to internal pelvic floor exam and muscle release.   Begin postural strengthening exercises  next session (tband, cervical retraction and posterior shoulder rolls)    Goals    Problem List Patient Active Problem List   Diagnosis Date Noted  . Coccydynia 05/07/2013  . Anemia 08/06/2012  . PUD (peptic ulcer disease) 02/06/2012  . OVERWEIGHT/OBESITY 12/21/2010  . HYPERTENSION 12/29/2009  . 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 GERD 12/29/2009  . HIATAL HERNIA 12/29/2009  . HEART MURMUR, SYSTOLIC 12/29/2009    PT - End of Session Activity Tolerance: Patient tolerated treatment well General Behavior During Therapy: WFL for tasks assessed/performed Cognition: WFL for tasks performed  GP    Juel Burrow 05/14/2013, 3:15 PM

## 2013-05-16 ENCOUNTER — Telehealth (HOSPITAL_COMMUNITY): Payer: Self-pay

## 2013-05-16 ENCOUNTER — Ambulatory Visit (HOSPITAL_COMMUNITY): Payer: 59

## 2013-05-21 ENCOUNTER — Ambulatory Visit (HOSPITAL_COMMUNITY): Payer: 59

## 2013-05-21 ENCOUNTER — Inpatient Hospital Stay (HOSPITAL_COMMUNITY)
Admission: EM | Admit: 2013-05-21 | Discharge: 2013-05-24 | DRG: 373 | Disposition: A | Discharge status: Home / Self Care | Payer: 59 | Attending: Family Medicine | Admitting: Family Medicine

## 2013-05-21 ENCOUNTER — Encounter (HOSPITAL_COMMUNITY): Payer: Self-pay

## 2013-05-21 DIAGNOSIS — D696 Thrombocytopenia, unspecified: Secondary | ICD-10-CM | POA: Diagnosis present

## 2013-05-21 DIAGNOSIS — Z96659 Presence of unspecified artificial knee joint: Secondary | ICD-10-CM

## 2013-05-21 DIAGNOSIS — R000 Tachycardia, unspecified: Secondary | ICD-10-CM | POA: Diagnosis present

## 2013-05-21 DIAGNOSIS — R82998 Other abnormal findings in urine: Secondary | ICD-10-CM

## 2013-05-21 DIAGNOSIS — R011 Cardiac murmur, unspecified: Secondary | ICD-10-CM

## 2013-05-21 DIAGNOSIS — A020 Salmonella enteritis: Principal | ICD-10-CM | POA: Diagnosis present

## 2013-05-21 DIAGNOSIS — R8290 Unspecified abnormal findings in urine: Secondary | ICD-10-CM | POA: Diagnosis present

## 2013-05-21 DIAGNOSIS — E860 Dehydration: Secondary | ICD-10-CM | POA: Diagnosis present

## 2013-05-21 DIAGNOSIS — R197 Diarrhea, unspecified: Secondary | ICD-10-CM

## 2013-05-21 DIAGNOSIS — E663 Overweight: Secondary | ICD-10-CM

## 2013-05-21 DIAGNOSIS — E876 Hypokalemia: Secondary | ICD-10-CM | POA: Diagnosis present

## 2013-05-21 DIAGNOSIS — R509 Fever, unspecified: Secondary | ICD-10-CM | POA: Diagnosis present

## 2013-05-21 DIAGNOSIS — A029 Salmonella infection, unspecified: Secondary | ICD-10-CM | POA: Diagnosis present

## 2013-05-21 DIAGNOSIS — D649 Anemia, unspecified: Secondary | ICD-10-CM | POA: Diagnosis present

## 2013-05-21 DIAGNOSIS — J45909 Unspecified asthma, uncomplicated: Secondary | ICD-10-CM | POA: Diagnosis present

## 2013-05-21 DIAGNOSIS — M533 Sacrococcygeal disorders, not elsewhere classified: Secondary | ICD-10-CM

## 2013-05-21 DIAGNOSIS — K279 Peptic ulcer, site unspecified, unspecified as acute or chronic, without hemorrhage or perforation: Secondary | ICD-10-CM

## 2013-05-21 DIAGNOSIS — E669 Obesity, unspecified: Secondary | ICD-10-CM | POA: Diagnosis present

## 2013-05-21 DIAGNOSIS — M129 Arthropathy, unspecified: Secondary | ICD-10-CM | POA: Diagnosis present

## 2013-05-21 DIAGNOSIS — Z6833 Body mass index (BMI) 33.0-33.9, adult: Secondary | ICD-10-CM

## 2013-05-21 DIAGNOSIS — K219 Gastro-esophageal reflux disease without esophagitis: Secondary | ICD-10-CM | POA: Diagnosis present

## 2013-05-21 DIAGNOSIS — K449 Diaphragmatic hernia without obstruction or gangrene: Secondary | ICD-10-CM | POA: Diagnosis present

## 2013-05-21 DIAGNOSIS — I10 Essential (primary) hypertension: Secondary | ICD-10-CM | POA: Diagnosis present

## 2013-05-21 DIAGNOSIS — K529 Noninfective gastroenteritis and colitis, unspecified: Secondary | ICD-10-CM | POA: Diagnosis present

## 2013-05-21 LAB — COMPREHENSIVE METABOLIC PANEL
ALT: 19 U/L (ref 0–35)
BUN: 17 mg/dL (ref 6–23)
Calcium: 8.7 mg/dL (ref 8.4–10.5)
GFR calc Af Amer: 78 mL/min — ABNORMAL LOW (ref >90)
Glucose, Bld: 139 mg/dL — ABNORMAL HIGH (ref 70–99)
Sodium: 137 mEq/L (ref 135–145)
Total Protein: 6.3 g/dL (ref 6.0–8.3)

## 2013-05-21 LAB — CBC WITH DIFFERENTIAL/PLATELET
Basophils Relative: 0 % (ref 0–1)
Eosinophils Absolute: 0 10*3/uL (ref 0.0–0.7)
Eosinophils Relative: 0 % (ref 0–5)
Lymphs Abs: 0.8 10*3/uL (ref 0.7–4.0)
MCH: 29.1 pg (ref 26.0–34.0)
MCHC: 33.5 g/dL (ref 30.0–36.0)
MCV: 86.7 fL (ref 78.0–100.0)
Platelets: 209 10*3/uL (ref 150–400)
RBC: 4.44 MIL/uL (ref 3.87–5.11)
RDW: 15.6 % — ABNORMAL HIGH (ref 11.5–15.5)

## 2013-05-21 LAB — URINE MICROSCOPIC-ADD ON

## 2013-05-21 LAB — URINALYSIS, ROUTINE W REFLEX MICROSCOPIC
Nitrite: NEGATIVE
Specific Gravity, Urine: >1.03 — ABNORMAL HIGH (ref 1.005–1.030)
Urobilinogen, UA: 0.2 mg/dL (ref 0.0–1.0)

## 2013-05-21 LAB — LIPASE, BLOOD: Lipase: 36 U/L (ref 11–59)

## 2013-05-21 MED ORDER — ACETAMINOPHEN 500 MG PO TABS
1000.0000 mg | ORAL_TABLET | Freq: Once | ORAL | Status: AC
  Administered 2013-05-21: 1000 mg via ORAL
  Filled 2013-05-21: qty 2

## 2013-05-21 MED ORDER — POTASSIUM CHLORIDE 10 MEQ/100ML IV SOLN
10.0000 meq | Freq: Once | INTRAVENOUS | Status: AC
  Administered 2013-05-21: 10 meq via INTRAVENOUS
  Filled 2013-05-21: qty 100

## 2013-05-21 MED ORDER — MORPHINE SULFATE 4 MG/ML IJ SOLN
4.0000 mg | Freq: Once | INTRAMUSCULAR | Status: AC
  Administered 2013-05-21: 4 mg via INTRAVENOUS
  Filled 2013-05-21: qty 1

## 2013-05-21 MED ORDER — ONDANSETRON HCL 4 MG/2ML IJ SOLN
4.0000 mg | Freq: Once | INTRAMUSCULAR | Status: AC
  Administered 2013-05-21: 4 mg via INTRAVENOUS
  Filled 2013-05-21: qty 2

## 2013-05-21 MED ORDER — SODIUM CHLORIDE 0.9 % IV BOLUS (SEPSIS)
1000.0000 mL | Freq: Once | INTRAVENOUS | Status: AC
  Administered 2013-05-21: 1000 mL via INTRAVENOUS

## 2013-05-21 MED ORDER — PANTOPRAZOLE SODIUM 40 MG IV SOLR
40.0000 mg | Freq: Once | INTRAVENOUS | Status: AC
  Administered 2013-05-21: 40 mg via INTRAVENOUS
  Filled 2013-05-21: qty 40

## 2013-05-21 NOTE — ED Provider Notes (Signed)
History    This chart was scribed for [employer]. Rubin Payor, MD by Quintella Reichert, ED scribe.  This patient was seen in room APA06/APA06 and the patient's care was started at 6:07 PM.   CSN: [id number]  Arrival date & time 05/21/13  1719      Chief Complaint  Patient presents with  . Diarrhea     The history is provided by the patient. No language interpreter was used.    HPI Comments: Hailey KOSLOSKI is a 67 y.o. female who presents to the Emergency Department complaining of diarrhea that began 12 hours ago, with accompanying intermittent nausea, emesis, fever, CP and headache.  She describes diarrhea as a "gallon bucket-full every time I go" and states she has had 6 episodes today.  She denies blood in stool.  She notes that she has vomited one time.  Husband reports that pt's highest temperature pta was 103.5 F.  Pt also notes decreased appetite.  Pt notes that her grandson had similar symptoms a couple weeks ago.  She states that she did not have symptoms yesterday but felt slightly unwell.  She denies recent antibiotic use or recent travel.  She denies any other serious medical problems.  Pt notes she is allergic to percocet (makes her nauseous).    Past Medical History  Diagnosis Date  . Irregular heart beat   . Asthma   . GERD (gastroesophageal reflux disease)   . Arthritis   . Anginal pain     cardiac clearance on the chart-pt also has GI promblems  . Murmur      Since Birth followed by Dr Daleen Squibb.  . H/O hiatal hernia     'Sliding"  . 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 in left ear.  Left  side of body weaker than right.  Carpal tunnel bil .  Marland Kitchen Carpal tunnel syndrome     bilateral- wears splints  . Ulcer     gastric  . Anemia     takes iron    Past Surgical History  Procedure Laterality Date  . Appendectomy    . Cholecystectomy    . Neck surgery      x 2   . Knee arthroscopy      Left  . Esophagogastroduodenoscopy   12/08/2011    Procedure: ESOPHAGOGASTRODUODENOSCOPY (EGD);  Surgeon: Malissa Hippo, MD;  [facility];  Service: Endoscopy;  Laterality: N/A;  3:00   . Colonoscopy    . Upper gastrointestinal endoscopy    . Hysterscopy    . Total knee arthroplasty  07/11/2012    Procedure: TOTAL KNEE ARTHROPLASTY;  Surgeon: Loreta Ave, MD;  Location: [facility] OR;  Service: Orthopedics;  Laterality: Left;  left total knee arthroplasty    Family History  Problem Relation Age of Onset  . Colon cancer Neg Hx   . Hypertension Son     History  Substance Use Topics  . Smoking status: Never Smoker   . Smokeless tobacco: Never Used  . Alcohol Use: No    OB History   Grav Para Term Preterm Abortions TAB SAB Ect Mult Living                  Review of Systems  Constitutional: Positive for fever and appetite change.  HENT: Negative for sore throat and rhinorrhea.   Respiratory: Negative for cough and shortness of breath.   Cardiovascular: Positive for chest pain.  Gastrointestinal: Positive  for nausea, vomiting and diarrhea. Negative for blood in stool.  Genitourinary: Negative for dysuria, hematuria and difficulty urinating.  Musculoskeletal: Negative for arthralgias and gait problem.  Skin: Negative for rash and wound.  Neurological: Positive for headaches. Negative for weakness and numbness.  Psychiatric/Behavioral: Negative for behavioral problems and agitation.    Allergies  Nsaids; Cephalexin; Darvocet; Percocet; and Penicillins  Home Medications   Current Outpatient Rx  Name  Route  Sig  Dispense  Refill  . alendronate (FOSAMAX) 70 MG tablet   Oral   Take 70 mg by mouth every 7 (seven) days. Take with a full glass of water on an empty stomach. *takes on Saturdays         . aspirin EC 81 MG tablet   Oral   Take 81 mg by mouth every morning.         . Cholecalciferol (VITAMIN D) 2000 UNITS CAPS   Oral   Take 1 capsule by mouth daily.           . diazepam (VALIUM) 5  MG tablet   Oral   Take 2.5 mg by mouth 2 (two) times daily.          Marland Kitchen esomeprazole (NEXIUM) 40 MG capsule   Oral   Take 1 capsule (40 mg total) by mouth daily before breakfast.   90 capsule   3   . ferrous sulfate 325 (65 FE) MG tablet   Oral   Take 325 mg by mouth daily with breakfast.         . gabapentin (NEURONTIN) 300 MG capsule   Oral   Take 300 mg by mouth at bedtime.           . hydrochlorothiazide (HYDRODIURIL) 25 MG tablet   Oral   Take 12.5 mg by mouth daily.         . montelukast (SINGULAIR) 10 MG tablet   Oral   Take 10 mg by mouth every morning.          . Probiotic Product (PROBIOTIC FORMULA) CAPS   Oral   Take 1 capsule by mouth daily.           BP 110/48  Pulse 87  Temp(Src) 99.6 F (37.6 C) (Oral)  Resp 20  Ht 5\' 4"  (1.626 m)  Wt 193 lb (87.544 kg)  BMI 33.11 kg/m2  SpO2 96%  Physical Exam  Nursing note and vitals reviewed. Constitutional: She is oriented to person, place, and time. She appears well-developed and well-nourished. No distress.  Looks uncomfortable  HENT:  Head: Normocephalic and atraumatic.  Eyes: Conjunctivae and EOM are normal.  Neck: Normal range of motion. Neck supple.  Cardiovascular: Regular rhythm and normal heart sounds.   No murmur heard. Tachycardic  Pulmonary/Chest: Effort normal and breath sounds normal. No respiratory distress. She has no wheezes. She has no rales.  Abdominal: Soft. There is tenderness (Mild diffuse abdominal tenderness). There is no rebound and no guarding.  Hyperactive bowel sounds  Neurological: She is alert and oriented to person, place, and time. Coordination normal.  Skin: Skin is warm and dry. No rash noted.  Psychiatric: She has a normal mood and affect. Her behavior is normal.    ED Course  Procedures (including critical care time)  6:10 PM-Discussed treatment plan which includes IV fluids, anti-emetics, pain medication and labs with pt at bedside and pt agreed to  plan.   8:52 PM: On recheck, pt states "I still just feel like I've been  beat."  Informed pt that her potassium is low, likely due to diarrhea. Discussed treatment plan including further hydration with IV fluids and further observation in ED.  Pt agreed with plan.  Results for orders placed during the hospital encounter of 05/21/13  CBC WITH DIFFERENTIAL      Result Value Range   WBC 13.4 (*) 4.0 - 10.5 K/uL   RBC 4.44  3.87 - 5.11 MIL/uL   Hemoglobin 12.9  12.0 - 15.0 g/dL   HCT 16.1  09.6 - 04.5 %   MCV 86.7  78.0 - 100.0 fL   MCH 29.1  26.0 - 34.0 pg   MCHC 33.5  30.0 - 36.0 g/dL   RDW 40.9 (*) 81.1 - 91.4 %   Platelets 209  150 - 400 K/uL   Neutrophils Relative % 90 (*) 43 - 77 %   Neutro Abs 12.0 (*) 1.7 - 7.7 K/uL   Lymphocytes Relative 6 (*) 12 - 46 %   Lymphs Abs 0.8  0.7 - 4.0 K/uL   Monocytes Relative 5  3 - 12 %   Monocytes Absolute 0.6  0.1 - 1.0 K/uL   Eosinophils Relative 0  0 - 5 %   Eosinophils Absolute 0.0  0.0 - 0.7 K/uL   Basophils Relative 0  0 - 1 %   Basophils Absolute 0.0  0.0 - 0.1 K/uL  COMPREHENSIVE METABOLIC PANEL      Result Value Range   Sodium 137  135 - 145 mEq/L   Potassium 2.9 (*) 3.5 - 5.1 mEq/L   Chloride 103  96 - 112 mEq/L   CO2 24  19 - 32 mEq/L   Glucose, Bld 139 (*) 70 - 99 mg/dL   BUN 17  6 - 23 mg/dL   Creatinine, Ser 7.82  0.50 - 1.10 mg/dL   Calcium 8.7  8.4 - 95.6 mg/dL   Total Protein 6.3  6.0 - 8.3 g/dL   Albumin 3.6  3.5 - 5.2 g/dL   AST 16  0 - 37 U/L   ALT 19  0 - 35 U/L   Alkaline Phosphatase 81  39 - 117 U/L   Total Bilirubin 0.3  0.3 - 1.2 mg/dL   GFR calc non Af Amer 67 (*) >90 mL/min   GFR calc Af Amer 78 (*) >90 mL/min  LIPASE, BLOOD      Result Value Range   Lipase 36  11 - 59 U/L        1. Nausea vomiting and diarrhea   2. Hypokalemia       MDM  Patient nausea vomiting diarrhea and fever. She has a moderate hypokalemia. She's continued to feel poorly after IV fluid bolus. She'll be admitted to  internal medicine.      I personally performed the services described in this documentation, which was scribed in my presence. The recorded information has been reviewed and is accurate.    Juliet Rude. Rubin Payor, MD 05/21/13 2156

## 2013-05-21 NOTE — ED Notes (Signed)
Pt c/o diarrhea since this am.  Also c/o headache and chest pain.  Reports nausea.  Husband says fever was 103.3

## 2013-05-21 NOTE — ED Notes (Signed)
Pt c/o abdominal pain along with N/V/D that began this morning when she woke up. Pt denies blood in vomit or stool. Pt also reports burning in chest.

## 2013-05-21 NOTE — H&P (Signed)
[facility] History and Physical  [name] XBJ:[id number] DOB: 05/19/1946 DOA: 05/21/2013   PCP: Colette Ribas, MD  Specialists: She is followed by Dr. Karilyn Cota for her hiatal hernia  Chief Complaint: Diarrhea since 6:30 this morning  HPI: [name] is a 67 y.o. female with a past medical history of hiatal hernia, hypertension, who was in her usual state of health till about 6:30 this morning when she woke up with urgency to have a bowel movement. This was followed by onset of her diarrhea. It has been a watery stool, brown in color, without any blood. Denies any mucus. She's had about 7-8 episodes today. Denies being on antibiotics recently. Did have a fever of 103 at home. Had some nausea, but denies any vomiting. Denies any abdominal pain. Has had a headache. Has some acid reflux and is complaining of some chest discomfort, which she usually has with her hiatal hernia. Has had decreased quantity of urine today. Denies any dysuria. She's never had similar symptoms in the past. She has had colonoscopy, but cannot remember when it was done. Denies any recent surgical procedures. She did mention, that she ate at [employer] last night. Had salad, soup and bread sticks. Other family members also went with her but nobody had the salad.  Home Medications: Prior to Admission medications   Medication Sig Start Date End Date Taking? Authorizing Provider  alendronate (FOSAMAX) 70 MG tablet Take 70 mg by mouth every 7 (seven) days. Take with a full glass of water on an empty stomach. *takes on Saturdays   Yes Historical Provider, MD  aspirin EC 81 MG tablet Take 81 mg by mouth every morning.   Yes Historical Provider, MD  Cholecalciferol (VITAMIN D) 2000 UNITS CAPS Take 1 capsule by mouth daily.     Yes Historical Provider, MD  diazepam (VALIUM) 5 MG tablet Take 2.5 mg by mouth 2 (two) times daily.    Yes Historical Provider, MD  esomeprazole (NEXIUM) 40 MG capsule Take 1 capsule (40  mg total) by mouth daily before breakfast. 08/06/12  Yes Malissa Hippo, MD  ferrous sulfate 325 (65 FE) MG tablet Take 325 mg by mouth daily with breakfast.   Yes Historical Provider, MD  gabapentin (NEURONTIN) 300 MG capsule Take 300 mg by mouth at bedtime.     Yes Historical Provider, MD  hydrochlorothiazide (HYDRODIURIL) 25 MG tablet Take 12.5 mg by mouth daily.   Yes Historical Provider, MD  montelukast (SINGULAIR) 10 MG tablet Take 10 mg by mouth every morning.    Yes Historical Provider, MD  Probiotic Product (PROBIOTIC FORMULA) CAPS Take 1 capsule by mouth daily.   Yes Historical Provider, MD    Allergies:  Allergies  Allergen Reactions  . Nsaids Other (See Comments)    Caused Ulcers.  . Cephalexin     unknown  . Darvocet (Propoxyphene-Acetaminophen)     nausea  . Percocet (Oxycodone-Acetaminophen) Nausea Only  . Penicillins Rash and Other (See Comments)    Redness    Past Medical History: Past Medical History  Diagnosis Date  . Irregular heart beat   . Asthma   . GERD (gastroesophageal reflux disease)   . Arthritis   . Anginal pain     cardiac clearance on the chart-pt also has GI promblems  . Murmur      Since Birth followed by Dr Daleen Squibb.  . H/O hiatal hernia     'Sliding"  . Neuromuscular disorder     Nerve  damage from neck surgery.- Constant ringing. in left ear.  Left  side of body weaker than right.  Carpal tunnel bil .  Marland Kitchen Carpal tunnel syndrome     bilateral- wears splints  . Ulcer     gastric  . Anemia     takes iron    Past Surgical History  Procedure Laterality Date  . Appendectomy    . Cholecystectomy    . Neck surgery      x 2   . Knee arthroscopy      Left  . Esophagogastroduodenoscopy  12/08/2011    Procedure: ESOPHAGOGASTRODUODENOSCOPY (EGD);  Surgeon: Malissa Hippo, MD;  Location: [facility];  Service: Endoscopy;  Laterality: N/A;  3:00   . Colonoscopy    . Upper gastrointestinal endoscopy    . Hysterscopy    . Total knee  arthroplasty  07/11/2012    Procedure: TOTAL KNEE ARTHROPLASTY;  Surgeon: Loreta Ave, MD;  Location: [facility] OR;  Service: Orthopedics;  Laterality: Left;  left total knee arthroplasty    Social History:  reports that she has never smoked. She has never used smokeless tobacco. She reports that she does not drink alcohol or use illicit drugs.  Living Situation: Lives with her husband Activity Level: Usually independent with daily activities   Family History:  Family History  Problem Relation Age of Onset  . Colon cancer Neg Hx   . Hypertension Son      Review of Systems - History obtained from the patient General ROS: positive for  - fatigue Psychological ROS: positive for - anxiety Ophthalmic ROS: negative ENT ROS: negative Allergy and Immunology ROS: negative Hematological and Lymphatic ROS: negative Endocrine ROS: negative Respiratory ROS: no cough, shortness of breath, or wheezing Cardiovascular ROS: no chest pain or dyspnea on exertion Gastrointestinal ROS: as in hpi Genito-Urinary ROS: no dysuria, trouble voiding, or hematuria Musculoskeletal ROS: negative Neurological ROS: no TIA or stroke symptoms Dermatological ROS: negative  Physical Examination  Filed Vitals:   05/21/13 1900 05/21/13 1912 05/21/13 2006 05/21/13 2133  BP: 115/46   110/48  Pulse: 96   87  Temp:   100.3 F (37.9 C) 99.6 F (37.6 C)  TempSrc:   Oral Oral  Resp:    20  Height:      Weight:      SpO2: 90% 96%    Initial temp was 103.  General appearance: alert, cooperative, appears stated age, no distress and moderately obese Head: Normocephalic, without obvious abnormality, atraumatic Eyes: conjunctivae/corneas clear. PERRL, EOM's intact. Fundi benign. Throat: dry mm Neck: no adenopathy, no carotid bruit, no JVD, supple, symmetrical, trachea midline and thyroid not enlarged, symmetric, no tenderness/mass/nodules Resp: clear to auscultation bilaterally Cardio: regular rate and rhythm, S1, S2  normal, no murmur, click, rub or gallop GI: soft, non-tender; bowel sounds normal; no masses,  no organomegaly Extremities: extremities normal, atraumatic, no cyanosis or edema Pulses: 2+ and symmetric Skin: Skin color, texture, turgor normal. No rashes or lesions Lymph nodes: Cervical, supraclavicular, and axillary nodes normal. Neurologic: Grossly normal  Laboratory Data: Results for orders placed during the hospital encounter of 05/21/13 (from the past 48 hour(s))  CBC WITH DIFFERENTIAL     Status: Abnormal   Collection Time    05/21/13  6:51 PM      Result Value Range   WBC 13.4 (*) 4.0 - 10.5 K/uL   RBC 4.44  3.87 - 5.11 MIL/uL   Hemoglobin 12.9  12.0 - 15.0 g/dL  HCT 38.5  36.0 - 46.0 %   MCV 86.7  78.0 - 100.0 fL   MCH 29.1  26.0 - 34.0 pg   MCHC 33.5  30.0 - 36.0 g/dL   RDW 91.4 (*) 78.2 - 95.6 %   Platelets 209  150 - 400 K/uL   Neutrophils Relative % 90 (*) 43 - 77 %   Neutro Abs 12.0 (*) 1.7 - 7.7 K/uL   Lymphocytes Relative 6 (*) 12 - 46 %   Lymphs Abs 0.8  0.7 - 4.0 K/uL   Monocytes Relative 5  3 - 12 %   Monocytes Absolute 0.6  0.1 - 1.0 K/uL   Eosinophils Relative 0  0 - 5 %   Eosinophils Absolute 0.0  0.0 - 0.7 K/uL   Basophils Relative 0  0 - 1 %   Basophils Absolute 0.0  0.0 - 0.1 K/uL  COMPREHENSIVE METABOLIC PANEL     Status: Abnormal   Collection Time    05/21/13  6:51 PM      Result Value Range   Sodium 137  135 - 145 mEq/L   Potassium 2.9 (*) 3.5 - 5.1 mEq/L   Chloride 103  96 - 112 mEq/L   CO2 24  19 - 32 mEq/L   Glucose, Bld 139 (*) 70 - 99 mg/dL   BUN 17  6 - 23 mg/dL   Creatinine, Ser 2.13  0.50 - 1.10 mg/dL   Calcium 8.7  8.4 - 08.6 mg/dL   Total Protein 6.3  6.0 - 8.3 g/dL   Albumin 3.6  3.5 - 5.2 g/dL   AST 16  0 - 37 U/L   ALT 19  0 - 35 U/L   Alkaline Phosphatase 81  39 - 117 U/L   Total Bilirubin 0.3  0.3 - 1.2 mg/dL   GFR calc non Af Amer 67 (*) >90 mL/min   GFR calc Af Amer 78 (*) >90 mL/min   Comment:            The eGFR has  been calculated     using the CKD EPI equation.     This calculation has not been     validated in all clinical     situations.     eGFR's persistently     <90 mL/min signify     possible Chronic Kidney Disease.  LIPASE, BLOOD     Status: None   Collection Time    05/21/13  6:51 PM      Result Value Range   Lipase 36  11 - 59 U/L  URINALYSIS, ROUTINE W REFLEX MICROSCOPIC     Status: Abnormal   Collection Time    05/21/13  9:33 PM      Result Value Range   Color, Urine YELLOW  YELLOW   APPearance CLEAR  CLEAR   Specific Gravity, Urine >1.030 (*) 1.005 - 1.030   pH 5.5  5.0 - 8.0   Glucose, UA NEGATIVE  NEGATIVE mg/dL   Hgb urine dipstick TRACE (*) NEGATIVE   Bilirubin Urine NEGATIVE  NEGATIVE   Ketones, ur TRACE (*) NEGATIVE mg/dL   Protein, ur NEGATIVE  NEGATIVE mg/dL   Urobilinogen, UA 0.2  0.0 - 1.0 mg/dL   Nitrite NEGATIVE  NEGATIVE   Leukocytes, UA NEGATIVE  NEGATIVE  URINE MICROSCOPIC-ADD ON     Status: Abnormal   Collection Time    05/21/13  9:33 PM      Result Value Range   Squamous Epithelial /  LPF RARE  RARE   WBC, UA 0-2  <3 WBC/hpf   RBC / HPF 0-2  <3 RBC/hpf   Bacteria, UA MANY (*) RARE   Urine-Other MUCOUS PRESENT      Radiology Reports: No results found.  Problem List  Principal Problem:   Acute gastroenteritis Active Problems:   HYPERTENSION   GERD   HIATAL HERNIA   Fever   Abnormal urinalysis   Hypokalemia   Dehydration   Assessment: This is a 67 year old, Caucasian female, who presents with acute diarrhea and fever. This is most likely acute gastroenteritis, most likely secondary to food borne illness. There is no suspicion for C. difficile. UA is mildly abnormal however, not conclusive for UTI.  Plan: #1 acute diarrhea most likely due to acute gastroenteritis versus foodborne illness. We will observe her in the hospital. Stool for C. difficile will be sent off. Since her abdomen is completely benign there is no need to obtain imaging  studies of her abdomen. Supportive treatment will be provided.  #2 fever: Most likely secondary to the above. Since the UA was mildly abnormal we will give her ciprofloxacin. Urine cultures will be followed up on. Lactic acid will be checked.  #3 abnormal UA: Give Cipro. Followup urine cultures.  #4 dehydration with hyperkalemia: She will be given IV fluids. Potassium will be repleted. Magnesium will be checked.  #5 hiatal hernia with acid reflux: Continue with proton pump inhibitors.  #6 history of hypertension: Hold her and hypertensive agents for now.  DVT Prophylaxis: Lovenox Code Status: Full code Family Communication:  Discussed with the patient and her husband Disposition Plan: Observation   Further management decisions will depend on results of further testing and patient's response to treatment.  [facility]  [facility] Pager [phone]  If 7PM-7AM, please contact night-coverage [email or web address] Password [facility]  05/21/2013, 11:32 PM

## 2013-05-22 DIAGNOSIS — R197 Diarrhea, unspecified: Secondary | ICD-10-CM

## 2013-05-22 DIAGNOSIS — K5289 Other specified noninfective gastroenteritis and colitis: Secondary | ICD-10-CM

## 2013-05-22 DIAGNOSIS — R112 Nausea with vomiting, unspecified: Secondary | ICD-10-CM

## 2013-05-22 LAB — COMPREHENSIVE METABOLIC PANEL
Alkaline Phosphatase: 80 U/L (ref 39–117)
BUN: 16 mg/dL (ref 6–23)
CO2: 23 mEq/L (ref 19–32)
Chloride: 102 mEq/L (ref 96–112)
Creatinine, Ser: 0.98 mg/dL (ref 0.50–1.10)
GFR calc non Af Amer: 59 mL/min — ABNORMAL LOW (ref >90)
Potassium: 3.6 mEq/L (ref 3.5–5.1)
Total Bilirubin: 0.4 mg/dL (ref 0.3–1.2)

## 2013-05-22 LAB — MAGNESIUM: Magnesium: 1.5 mg/dL (ref 1.5–2.5)

## 2013-05-22 LAB — CBC
MCH: 29.7 pg (ref 26.0–34.0)
MCV: 88.6 fL (ref 78.0–100.0)
Platelets: 231 10*3/uL (ref 150–400)
RBC: 4.58 MIL/uL (ref 3.87–5.11)
RDW: 16 % — ABNORMAL HIGH (ref 11.5–15.5)
WBC: 14.1 10*3/uL — ABNORMAL HIGH (ref 4.0–10.5)

## 2013-05-22 LAB — URINE CULTURE

## 2013-05-22 LAB — LACTIC ACID, PLASMA: Lactic Acid, Venous: 2.1 mmol/L (ref 0.5–2.2)

## 2013-05-22 MED ORDER — ONDANSETRON HCL 4 MG PO TABS
4.0000 mg | ORAL_TABLET | Freq: Four times a day (QID) | ORAL | Status: DC | PRN
  Filled 2013-05-22: qty 1

## 2013-05-22 MED ORDER — ONDANSETRON HCL 4 MG/2ML IJ SOLN: 4.0000 mg | Freq: Four times a day (QID) | INTRAMUSCULAR | Status: DC | PRN

## 2013-05-22 MED ORDER — PANTOPRAZOLE SODIUM 40 MG PO TBEC
40.0000 mg | DELAYED_RELEASE_TABLET | Freq: Two times a day (BID) | ORAL | Status: DC
  Administered 2013-05-22 – 2013-05-24 (×5): 40 mg via ORAL
  Filled 2013-05-22 (×5): qty 1

## 2013-05-22 MED ORDER — DIAZEPAM 5 MG PO TABS
2.5000 mg | ORAL_TABLET | Freq: Two times a day (BID) | ORAL | Status: DC
  Administered 2013-05-22 – 2013-05-24 (×6): 2.5 mg via ORAL
  Filled 2013-05-22 (×6): qty 1

## 2013-05-22 MED ORDER — GABAPENTIN 300 MG PO CAPS
300.0000 mg | ORAL_CAPSULE | Freq: Every day | ORAL | Status: DC
  Administered 2013-05-22 – 2013-05-23 (×3): 300 mg via ORAL
  Filled 2013-05-22 (×3): qty 1

## 2013-05-22 MED ORDER — CIPROFLOXACIN IN D5W 400 MG/200ML IV SOLN
INTRAVENOUS | Status: AC
  Filled 2013-05-22: qty 200

## 2013-05-22 MED ORDER — MONTELUKAST SODIUM 10 MG PO TABS
10.0000 mg | ORAL_TABLET | Freq: Every morning | ORAL | Status: DC
  Administered 2013-05-22 – 2013-05-24 (×3): 10 mg via ORAL
  Filled 2013-05-22 (×3): qty 1

## 2013-05-22 MED ORDER — ACETAMINOPHEN 650 MG RE SUPP: 650.0000 mg | Freq: Four times a day (QID) | RECTAL | Status: DC | PRN

## 2013-05-22 MED ORDER — METRONIDAZOLE IN NACL 5-0.79 MG/ML-% IV SOLN
500.0000 mg | Freq: Three times a day (TID) | INTRAVENOUS | Status: DC
  Administered 2013-05-22 – 2013-05-23 (×5): 500 mg via INTRAVENOUS
  Filled 2013-05-22 (×7): qty 100

## 2013-05-22 MED ORDER — POTASSIUM CHLORIDE IN NACL 20-0.9 MEQ/L-% IV SOLN
INTRAVENOUS | Status: AC
  Administered 2013-05-22 (×2): via INTRAVENOUS

## 2013-05-22 MED ORDER — ACETAMINOPHEN 325 MG PO TABS
650.0000 mg | ORAL_TABLET | Freq: Four times a day (QID) | ORAL | Status: DC | PRN
  Administered 2013-05-22 – 2013-05-23 (×5): 650 mg via ORAL
  Filled 2013-05-22: qty 1
  Filled 2013-05-22 (×4): qty 2

## 2013-05-22 MED ORDER — ENOXAPARIN SODIUM 40 MG/0.4ML ~~LOC~~ SOLN
40.0000 mg | SUBCUTANEOUS | Status: DC
  Administered 2013-05-22 – 2013-05-24 (×3): 40 mg via SUBCUTANEOUS
  Filled 2013-05-22 (×4): qty 0.4

## 2013-05-22 MED ORDER — POTASSIUM CHLORIDE CRYS ER 20 MEQ PO TBCR
40.0000 meq | EXTENDED_RELEASE_TABLET | Freq: Once | ORAL | Status: AC
  Administered 2013-05-22: 40 meq via ORAL
  Filled 2013-05-22: qty 2

## 2013-05-22 MED ORDER — CIPROFLOXACIN IN D5W 400 MG/200ML IV SOLN
400.0000 mg | Freq: Two times a day (BID) | INTRAVENOUS | Status: DC
  Administered 2013-05-22 – 2013-05-24 (×5): 400 mg via INTRAVENOUS
  Filled 2013-05-22 (×6): qty 200

## 2013-05-22 NOTE — Progress Notes (Signed)
[facility]  JUDETH GILLES ZOX:[id number] DOB: 1946-08-22 DOA: 05/21/2013 PCP: Colette Ribas, MD She is followed by Dr. Karilyn Cota for her hiatal hernia  Patient seen, independently examined and chart reviewed. I agree with exam, assessment and plan discussed with Toya Smothers, NP. Note patient has hypokalemia, not hyperkalemia.  Interval history/Subjective: Watery diarrhea continues. One episode of vomiting since admission. No abdominal pain. Feels about the same.  Objective: Tmax 103, fever continues. Vitals otherwise unremarkable. Appears calm, mildly ill. Nontoxic. Speech fluent and clear. Cardiovascular tachycardic, regular rhythm. Respiratory clear to auscultation bilaterally. No wheezes, rales, rhonchi. Normal respiratory effort. Abdomen soft, nondistended, no guarding. Positive bowel sounds.   Labs/studies: Complete metabolic panel essentially unremarkable. Potassium now 3.6 after replacement. Lactic acid normal on admission. White blood cell count slightly elevated from yesterday 14.1. Urinalysis was equivocal. Blood cultures and urine culture are pending.  Assessment:  Profuse watery diarrhea with fever: No recent antibiotic use making C. difficile less likely. History suggests enteritis, viral suspected. Continue Flagyl until C. difficile PCR resulted. Positive sick contact with grandson with similar symptoms though not intense recently.  Possible UTI: Followup culture. Continue Cipro.   Plan:  Send GI panel, stool studies, followup C. difficile PCR   IV fluids, supportive care   Physical therapy is recommended outpatient treatment.   Summary: 67 year old woman who developed sudden fever and watery diarrhea in 24 hours prior to admission. No abdominal pain. Admitted for further evaluation.   Brendia Sacks, MD [facility] [phone]

## 2013-05-22 NOTE — Progress Notes (Signed)
PT Cancellation Note  Patient Details Name: [name] MRN: [id number] DOB: September 29, 1946   Cancelled Treatment:     Therapist checked on patient twice this morning to attempt evaluation.  Both times pt was in the bathroom with an episode of diarrhea will attempt to see pt tomorrow.   RUSSELL,CINDY 05/22/2013, 9:35 AM

## 2013-05-22 NOTE — Evaluation (Addendum)
Physical Therapy Evaluation Patient Details Name: [name] MRN: [id number] DOB: Jul 04, 1946 Today's Date: 05/22/2013 Time:1102  - 1126    PT Assessment / Plan / Recommendation Clinical Impression  Pt currently admitted into hospital with recurrent nausea and diarrhea.  Pt is normally I with all activities but does not want to walk at this time secondary to feeling "spent".   Pt will be totally assessed at a later date but therapist does not anticipate pt will have any acute therapy needs once her medical condition is under control.      PT Assessment  Patient needs continued PT services    Follow Up Recommendations  Outpatient PT (PT in Out-pt therapy for her back;  will resume at Discharge)    Does the patient have the potential to tolerate intense rehabilitation    N/A  Barriers to Discharge   none       Equipment Recommendations  None recommended by PT    Recommendations for Other Services   none  Frequency Min 2X/week    Precautions / Restrictions Precautions Precautions: None Restrictions Weight Bearing Restrictions: No   Pertinent Vitals/Pain 0/10      Mobility  Bed Mobility Bed Mobility: Supine to Sit;Sit to Supine Supine to Sit: 6: Modified independent (Device/Increase time) Sit to Supine: 6: Modified independent (Device/Increase time) Transfers Transfers: Sit to Stand;Stand to Sit;Stand Pivot Transfers Sit to Stand: 6: Modified independent (Device/Increase time) Stand to Sit: 6: Modified independent (Device/Increase time) Stand Pivot Transfers: 6: Modified independent (Device/Increase time) Ambulation/Gait Ambulation/Gait Assistance: Not tested (comment) (Pt not feeling well)    Exercises  none   PT Diagnosis:   weakness PT Problem List:  weakness PT Treatment Interventions: Gait training   PT Goals Acute Rehab PT Goals PT Goal Formulation: With patient Time For Goal Achievement: 05/24/13 Potential to Achieve Goals: Good Pt will Ambulate:  >150 feet PT Goal: Perform Home Exercise Program - Progress: Goal set today.  Once goal has been met will discontinue therapy.  Visit Information  Last PT Received On: 05/22/13 Reason Eval/Treat Not Completed: Medical issues which prohibited therapy    Subjective Data  Subjective: Pt states she lives with her husband.  Pt does not use an assistive device to ambulate. Patient Stated Goal: To feel better   Prior Functioning  Home Living Lives With: Spouse Available Help at Discharge: Family Type of Home: House Home Access: Stairs to enter Secretary/administrator of Steps: 2 Entrance Stairs-Rails: None Home Layout: One level Bathroom Shower/Tub: Network engineer: None Prior Function Level of Independence: Independent Able to Take Stairs?: Yes Driving: Yes Vocation: Unemployed Communication Communication: No difficulties    Cognition  Cognition Arousal/Alertness: Awake/alert Behavior During Therapy: WFL for tasks assessed/performed Overall Cognitive Status: Within Functional Limits for tasks assessed    Extremity/Trunk Assessment Left Upper Extremity Assessment LUE ROM/Strength/Tone: WFL for tasks assessed Right Lower Extremity Assessment RLE ROM/Strength/Tone: [facility] for tasks assessed   Balance    End of Session PT - End of Session Patient left: in bed  GP     [name] 05/22/2013, 11:30 AM

## 2013-05-22 NOTE — Progress Notes (Signed)
[facility] PROGRESS NOTE  [name] WGN:[id number] DOB: 11-07-1946 DOA: 05/21/2013 PCP: Colette Ribas, MD  Assessment/Plan: #1 acute diarrhea most likely due to acute gastroenteritis versus foodborne illness. Continues with frequent watery stools after minimal intake of clear liquids.  Stool for C. Difficile pending. White count trending up and pt max temp 102.9. Will start empiric flagyl. Continue supportive treatment.  #2 fever: Most likely secondary to the above. Max temp 102.9. UA was only mildly abnormal so am wondering if more related to #1. Will continue ciprofloxacin day #2. Urine cultures pending.Blood cultures pending. Lactic acid 2.1. Pt somewhat ill appearing but non-toxic at this time.    #3 abnormal UA: continue Cipro day #2. Await urine cultures.   #4 dehydration with hyperkalemia: continue IV fluids and extend time given pt's continued frequent watery stool. Potasium up 3.6. Continue potassium in IV.   Magnesium 2.1.   #5 hiatal hernia with acid reflux: Continue with proton pump inhibitors.   #6 history of hypertension: SBP range 110-120. Continue to hold her and hypertensive agents for now.   #7. Hypokalemia: see #4. Recheck in am.    Code Status: full Family Communication: Husband at bedside Disposition Plan: home when ready likely day or 2.    Consultants:  none  Procedures:  none  Antibiotics:  cipro 05/21/13>>>  Flagyl 05/22/13>>>  HPI/Subjective: Alert. Reports little improvement with frequent watery stools. Mild nausea  Objective: Filed Vitals:   05/21/13 2300 05/22/13 0027 05/22/13 0500 05/22/13 0700  BP: 120/53 118/73 110/47   Pulse: 86 102 94   Temp:  102.9 F (39.4 C) 101.3 F (38.5 C) 99.6 F (37.6 C)  TempSrc:  Oral Oral   Resp:  20 18   Height:      Weight:      SpO2: 100% 94% 95%     Intake/Output Summary (Last 24 hours) at 05/22/13 0844 Last data filed at 05/22/13 0600  Gross per 24 hour  Intake    600 ml   Output    502 ml  Net     98 ml   Filed Weights   05/21/13 1729  Weight: 87.544 kg (193 lb)    Exam:   General:  Obese NAD but somewhat ill appearing  Cardiovascular: RRR no MGR trace LE edema  Respiratory: normal effort BS clear bilaterally. No wheeze no rhonchi  Abdomen: obese soft +BS non-tender to palpation  Musculoskeletal: no clubbing no cyanosis    Data Reviewed: Basic Metabolic Panel:  Recent Labs Lab 05/21/13 1851 05/22/13 0456  NA 137 136  K 2.9* 3.6  CL 103 102  CO2 24 23  GLUCOSE 139* 141*  BUN 17 16  CREATININE 0.88 0.98  CALCIUM 8.7 8.0*  MG  --  1.5   Liver Function Tests:  Recent Labs Lab 05/21/13 1851 05/22/13 0456  AST 16 23  ALT 19 23  ALKPHOS 81 80  BILITOT 0.3 0.4  PROT 6.3 6.3  ALBUMIN 3.6 3.4*    Recent Labs Lab 05/21/13 1851  LIPASE 36   No results found for this basename: AMMONIA,  in the last 168 hours CBC:  Recent Labs Lab 05/21/13 1851 05/22/13 0456  WBC 13.4* 14.1*  NEUTROABS 12.0*  --   HGB 12.9 13.6  HCT 38.5 40.6  MCV 86.7 88.6  PLT 209 231   Cardiac Enzymes: No results found for this basename: CKTOTAL, CKMB, CKMBINDEX, TROPONINI,  in the last 168 hours BNP (last 3 results) No results found  for this basename: PROBNP,  in the last 8760 hours CBG: No results found for this basename: GLUCAP,  in the last 168 hours  Recent Results (from the past 240 hour(s))  CULTURE, BLOOD (ROUTINE X 2)     Status: None   Collection Time    05/22/13 12:40 AM      Result Value Range Status   Specimen Description BLOOD LEFT ANTECUBITAL   Final   Special Requests     Final   Value: BOTTLES DRAWN AEROBIC AND ANAEROBIC AEB 5CC ANA 4CC   Culture PENDING   Incomplete   Report Status PENDING   Incomplete  CULTURE, BLOOD (ROUTINE X 2)     Status: None   Collection Time    05/22/13 12:53 AM      Result Value Range Status   Specimen Description BLOOD LEFT ANTECUBITAL   Final   Special Requests     Final   Value: BOTTLES  DRAWN AEROBIC AND ANAEROBIC AEB 12CC ANA 10CC   Culture PENDING   Incomplete   Report Status PENDING   Incomplete     Studies: No results found.  Scheduled Meds: . ciprofloxacin  400 mg Intravenous Q12H  . diazepam  2.5 mg Oral BID  . enoxaparin (LOVENOX) injection  40 mg Subcutaneous Q24H  . gabapentin  300 mg Oral QHS  . metronidazole  500 mg Intravenous Q8H  . montelukast  10 mg Oral q morning - 10a  . pantoprazole  40 mg Oral BID AC   Continuous Infusions: . 0.9 % NaCl with KCl 20 mEq / L 100 mL/hr at 05/22/13 0028    Principal Problem:   Acute gastroenteritis Active Problems:   HYPERTENSION   GERD   HIATAL HERNIA   Fever   Abnormal urinalysis   Hypokalemia   Dehydration    Time spent: 30 minutes    [facility] M  [facility] Pager [phone]. If 7PM-7AM, please contact night-coverage at [email or web address], password [facility] 05/22/2013, 8:44 AM  LOS: 1 day

## 2013-05-22 NOTE — Progress Notes (Signed)
UR Chart Review Completed  

## 2013-05-23 ENCOUNTER — Ambulatory Visit (HOSPITAL_COMMUNITY): Payer: 59 | Admitting: Physical Therapy

## 2013-05-23 DIAGNOSIS — R509 Fever, unspecified: Secondary | ICD-10-CM

## 2013-05-23 LAB — BASIC METABOLIC PANEL
BUN: 11 mg/dL (ref 6–23)
CO2: 22 mEq/L (ref 19–32)
Chloride: 104 mEq/L (ref 96–112)
Creatinine, Ser: 0.88 mg/dL (ref 0.50–1.10)
GFR calc Af Amer: 78 mL/min — ABNORMAL LOW (ref >90)
Glucose, Bld: 117 mg/dL — ABNORMAL HIGH (ref 70–99)
Potassium: 2.8 mEq/L — ABNORMAL LOW (ref 3.5–5.1)

## 2013-05-23 LAB — CBC
HCT: 37.2 % (ref 36.0–46.0)
Hemoglobin: 12.3 g/dL (ref 12.0–15.0)
MCV: 86.5 fL (ref 78.0–100.0)
RBC: 4.3 MIL/uL (ref 3.87–5.11)
WBC: 7 10*3/uL (ref 4.0–10.5)

## 2013-05-23 LAB — GI PATHOGEN PANEL BY PCR, STOOL
C difficile toxin A/B: NEGATIVE
Campylobacter by PCR: NEGATIVE
E coli 0157 by PCR: NEGATIVE
G lamblia by PCR: NEGATIVE
Rotavirus A by PCR: NEGATIVE
Salmonella by PCR: POSITIVE

## 2013-05-23 LAB — FECAL LACTOFERRIN, QUANT

## 2013-05-23 MED ORDER — POTASSIUM CHLORIDE CRYS ER 20 MEQ PO TBCR
40.0000 meq | EXTENDED_RELEASE_TABLET | Freq: Once | ORAL | Status: AC
  Administered 2013-05-23: 40 meq via ORAL
  Filled 2013-05-23: qty 2

## 2013-05-23 MED ORDER — POTASSIUM CHLORIDE CRYS ER 20 MEQ PO TBCR: 40.0000 meq | EXTENDED_RELEASE_TABLET | ORAL | Status: DC

## 2013-05-23 MED ORDER — SODIUM CHLORIDE 0.9 % IV SOLN
INTRAVENOUS | Status: DC
  Administered 2013-05-23 – 2013-05-24 (×4): via INTRAVENOUS

## 2013-05-23 MED ORDER — POTASSIUM CHLORIDE CRYS ER 20 MEQ PO TBCR: 40.0000 meq | EXTENDED_RELEASE_TABLET | Freq: Once | ORAL | Status: DC

## 2013-05-23 MED ORDER — POTASSIUM CHLORIDE 10 MEQ/100ML IV SOLN
10.0000 meq | INTRAVENOUS | Status: AC
  Administered 2013-05-23 (×4): 10 meq via INTRAVENOUS
  Filled 2013-05-23: qty 100
  Filled 2013-05-23: qty 300

## 2013-05-23 MED ORDER — RISAQUAD PO CAPS
2.0000 | ORAL_CAPSULE | Freq: Every day | ORAL | Status: DC
  Administered 2013-05-23 – 2013-05-24 (×2): 2 via ORAL
  Filled 2013-05-23 (×2): qty 2

## 2013-05-23 NOTE — Progress Notes (Signed)
[facility]  MARKELLA DAO ZOX:[id number] DOB: August 02, 1946 DOA: 05/21/2013 PCP: Colette Ribas, MD  Patient seen, independently examined and chart reviewed. I agree with exam, assessment and plan discussed with Toya Smothers, NP.  Interval history/Subjective: She has continued to have fever overnight up to 102.6. Hemodynamically has remained stable with mild tachycardia. No hypoxia. Oral intake has been minimal and is associated with nearly immediate diarrhea. Numerous stools, watery.  Objective: Appears calm, mildly uncomfortable, nontoxic. Speech fluent and clear. Cardiovascular tachycardic, regular rhythm. Respiratory clear to auscultation bilaterally. No wheezes, rales, rhonchi. Abdomen is soft, nontender and nondistended. Positive bowel sounds. Lower extremities edema.  Labs/studies: C. difficile PCR negative. Leukocytosis has resolved, today 7.0. Potassium 2.8. Renal function without change, normal. GI pathogen panel and stool culture pending. Fecal lactoferrin positive.  Assessment:  Profuse watery diarrhea with persistent fever: No recent antibiotic use. History suggests enteritis, viral suspected. She is no abdominal pain and appears clinically stable. Positive sick contact with grandson with similar symptoms though not as intense  Possible UTI: Followup culture. Continue Cipro.  Plan:  Followup GI pathogen panel, stool studies. We will continue empiric antibiotics pending results. Given lack of abdominal pain and benign examination do not feel imaging would be of value at this point.  Continue IV fluids, supportive care  Physical therapy is recommended outpatient treatment  Discussed with husband at bedside.  Summary:  67 year old woman who developed sudden fever and watery diarrhea in 24 hours prior to admission. No abdominal pain. Admitted for further evaluation.  Brendia Sacks, MD [facility] [phone]

## 2013-05-23 NOTE — Progress Notes (Signed)
Notified Dr Orvan Falconer of fever of 102.7. Was 101.4 last PM. Tylenol given. Orders to call if fever greater than 100.5. No new orders given.

## 2013-05-23 NOTE — Progress Notes (Signed)
[facility] PROGRESS NOTE  [name] ZOX:[id number] DOB: [birth year] DOA: 05/21/2013 PCP: Colette Ribas, MD  Assessment/Plan: #1 acute diarrhea most likely due to acute gastroenteritis versus foodborne illness. Continues with watery stools but somewhat less often. Stool for C. Difficile neg. GI pathogen panel pending. Fecal lactoferrin +. White count within normal limits.  max temp 102.6. Continue  Flagyl day #2. Pt without abdominal pain so do not think CT warranted at this time.  Continue supportive treatment and monitor closely.    #2 fever: Most likely secondary to the above. Max temp 102.6. Will continue ciprofloxacin day #3. Urine cultures with no growth. Blood cultures with no growth.  Lactic acid 2.1. Pt remains somewhat ill appearing but non-toxic at this time.   #3 abnormal UA: continue Cipro day #3. Urine cultures no growth.   #4 dehydration with hyperkalemia: continue IV fluids and extend time given pt's continued frequent watery stool. Potasium up 2.6 likely related to #1. Will give 4 runs IV potassium. Recheck mag in am.   #5 hiatal hernia with acid reflux:  Stable. Continue with proton pump inhibitors.   #6 history of hypertension: SBP range 127-137. Home anti-hypertensive agent HCTZ. Continue to hold. Monitor BP and add different anti-hypertensive as indicated.   #7. Hypokalemia: see #4. Recheck in am.   #8. Tachycardia: mild. likely related to #2, #4. Continue above treatments. monitor   Code Status: full Family Communication: husband at bedside Disposition Plan: home when ready likely day or 2.    Consultants:  none  Procedures:  none  Antibiotics:  cipro 05/21/13>>>  Flagyl 05/22/13>>>  HPI/Subjective: Alert. Reports continued liquid stool slightly less frequent. Denies pain/discomfort. States abdomen "rolling"  Objective: Filed Vitals:   05/22/13 1400 05/22/13 1616 05/22/13 2157 05/23/13 0550  BP: 137/59  135/55 127/57  Pulse: 105  109  103  Temp: 102.6 F (39.2 C) 101.2 F (38.4 C) 101.4 F (38.6 C) 102.6 F (39.2 C)  TempSrc: Oral     Resp: 18  18 20   Height:      Weight:      SpO2: 99%  96% 97%    Intake/Output Summary (Last 24 hours) at 05/23/13 0851 Last data filed at 05/23/13 0500  Gross per 24 hour  Intake   2140 ml  Output    600 ml  Net   1540 ml   Filed Weights   05/21/13 1729  Weight: 87.544 kg (193 lb)    Exam:   General:  Obese NAD  Cardiovascular:  Mildly tachycardic but regular. No MGR trace LE edema  Respiratory: normal effort BS clear to auscultation bilaterally. No wheeze or rhonchi  Abdomen: obese soft +BS but sluggish. Non-tender to palpation  Musculoskeletal: no clubbing or cyanosis.    Data Reviewed: Basic Metabolic Panel:  Recent Labs Lab 05/21/13 1851 05/22/13 0456 05/23/13 0500  NA 137 136 136  K 2.9* 3.6 2.8*  CL 103 102 104  CO2 24 23 22   GLUCOSE 139* 141* 117*  BUN 17 16 11   CREATININE 0.88 0.98 0.88  CALCIUM 8.7 8.0* 7.6*  MG  --  1.5  --    Liver Function Tests:  Recent Labs Lab 05/21/13 1851 05/22/13 0456  AST 16 23  ALT 19 23  ALKPHOS 81 80  BILITOT 0.3 0.4  PROT 6.3 6.3  ALBUMIN 3.6 3.4*    Recent Labs Lab 05/21/13 1851  LIPASE 36   No results found for this basename: AMMONIA,  in the last  168 hours CBC:  Recent Labs Lab 05/21/13 1851 05/22/13 0456 05/23/13 0500  WBC 13.4* 14.1* 7.0  NEUTROABS 12.0*  --   --   HGB 12.9 13.6 12.3  HCT 38.5 40.6 37.2  MCV 86.7 88.6 86.5  PLT 209 231 153   Cardiac Enzymes: No results found for this basename: CKTOTAL, CKMB, CKMBINDEX, TROPONINI,  in the last 168 hours BNP (last 3 results) No results found for this basename: PROBNP,  in the last 8760 hours CBG: No results found for this basename: GLUCAP,  in the last 168 hours  Recent Results (from the past 240 hour(s))  URINE CULTURE     Status: None   Collection Time    05/21/13 10:40 PM      Result Value Range Status   Specimen  Description URINE, CATHETERIZED   Final   Special Requests NONE   Final   Culture  Setup Time 05/21/2013 22:50   Final   Colony Count NO GROWTH   Final   Culture NO GROWTH   Final   Report Status 05/22/2013 FINAL   Final  CULTURE, BLOOD (ROUTINE X 2)     Status: None   Collection Time    05/22/13 12:40 AM      Result Value Range Status   Specimen Description BLOOD LEFT ANTECUBITAL   Final   Special Requests     Final   Value: BOTTLES DRAWN AEROBIC AND ANAEROBIC AEB=5CC ANA=4CC   Culture NO GROWTH <24 HRS   Final   Report Status PENDING   Incomplete  CULTURE, BLOOD (ROUTINE X 2)     Status: None   Collection Time    05/22/13 12:53 AM      Result Value Range Status   Specimen Description BLOOD LEFT ANTECUBITAL   Final   Special Requests     Final   Value: BOTTLES DRAWN AEROBIC AND ANAEROBIC AEB=12CC ANA=10CC   Culture NO GROWTH <24 HRS   Final   Report Status PENDING   Incomplete  CLOSTRIDIUM DIFFICILE BY PCR     Status: None   Collection Time    05/22/13  5:11 AM      Result Value Range Status   C difficile by pcr NEGATIVE  NEGATIVE Final     Studies: No results found.  Scheduled Meds: . ciprofloxacin  400 mg Intravenous Q12H  . diazepam  2.5 mg Oral BID  . enoxaparin (LOVENOX) injection  40 mg Subcutaneous Q24H  . gabapentin  300 mg Oral QHS  . metronidazole  500 mg Intravenous Q8H  . montelukast  10 mg Oral q morning - 10a  . pantoprazole  40 mg Oral BID AC  . potassium chloride  10 mEq Intravenous Q1 Hr x 4   Continuous Infusions: . sodium chloride 75 mL/hr at 05/23/13 0840    Principal Problem:   Acute gastroenteritis Active Problems:   HYPERTENSION   GERD   HIATAL HERNIA   Fever   Abnormal urinalysis   Hypokalemia   Dehydration    Time spent: 30 minutes    [facility] M  [facility] Pager [phone]. If 7PM-7AM, please contact night-coverage at [email or web address], password [facility] 05/23/2013, 8:51 AM  LOS: 2 days

## 2013-05-23 NOTE — Progress Notes (Signed)
UR chart review completed.  

## 2013-05-23 NOTE — Progress Notes (Signed)
PT Cancellation Note  Patient Details Name: [name] MRN: [id number] DOB: 1946-08-12   Cancelled Treatment:    Reason Eval/Treat Not Completed: Medical issues which prohibited therapy Pt c/o diarrhea which is limiting mobility and causing significant malaise.  Unable to tolerate PT.  Myrlene Broker L 05/23/2013, 12:16 PM

## 2013-05-24 DIAGNOSIS — D696 Thrombocytopenia, unspecified: Secondary | ICD-10-CM | POA: Diagnosis present

## 2013-05-24 DIAGNOSIS — A029 Salmonella infection, unspecified: Secondary | ICD-10-CM | POA: Diagnosis present

## 2013-05-24 LAB — BASIC METABOLIC PANEL
BUN: 7 mg/dL (ref 6–23)
CO2: 23 mEq/L (ref 19–32)
Chloride: 107 mEq/L (ref 96–112)
GFR calc Af Amer: >90 mL/min (ref >90)
Potassium: 3.1 mEq/L — ABNORMAL LOW (ref 3.5–5.1)

## 2013-05-24 LAB — CBC
HCT: 35.3 % — ABNORMAL LOW (ref 36.0–46.0)
Hemoglobin: 11.7 g/dL — ABNORMAL LOW (ref 12.0–15.0)
MCHC: 33.1 g/dL (ref 30.0–36.0)
MCV: 85.9 fL (ref 78.0–100.0)

## 2013-05-24 MED ORDER — RISAQUAD PO CAPS: 2.0000 | ORAL_CAPSULE | Freq: Every day | ORAL | Status: DC

## 2013-05-24 MED ORDER — POTASSIUM CHLORIDE CRYS ER 20 MEQ PO TBCR
40.0000 meq | EXTENDED_RELEASE_TABLET | Freq: Two times a day (BID) | ORAL | Status: DC
  Administered 2013-05-24: 40 meq via ORAL
  Filled 2013-05-24: qty 2

## 2013-05-24 MED ORDER — CIPROFLOXACIN HCL 500 MG PO TABS: 500.0000 mg | ORAL_TABLET | Freq: Two times a day (BID) | ORAL | Status: DC

## 2013-05-24 MED ORDER — CIPROFLOXACIN HCL 250 MG PO TABS
500.0000 mg | ORAL_TABLET | Freq: Two times a day (BID) | ORAL | Status: DC
  Administered 2013-05-24: 500 mg via ORAL
  Filled 2013-05-24: qty 2

## 2013-05-24 NOTE — Progress Notes (Signed)
D/c instructions reviewed with patient.  Verbalized understanding.  Pt dc'd to home with family. Schonewitz, Nakeya Adinolfi Anne 05/24/2013  

## 2013-05-24 NOTE — Progress Notes (Addendum)
[facility]  MONTASIA CHISENHALL ZOX:[id number] DOB: 20-Jan-1946 DOA: 05/21/2013 PCP: Colette Ribas, MD  Patient seen, independently examined and chart reviewed. I agree with exam, assessment and plan discussed with Toya Smothers, NP. Note patient has hypokalemia and not hyperkalemia.  Interval history/Subjective: Overall feels better. Diarrhea has decreased and appetite has improved. She has been tolerating liquids. Fever has resolved.  Objective: Now afebrile greater than 24 hours. Vitals otherwise stable. Appears better today. Speech fluent and clear. Cardiovascular regular rate and rhythm. Respiratory clear to auscultation bilaterally. Abdomen soft, positive bowel sounds.  Labs/studies: GI pathogen panel positive for Salmonella. Stool culture is pending. Potassium 3.1. Platelet count slightly decreased at 43. Hemoglobin 11.7.  Assessment:  Salmonella gastroenteritis without evidence of complication, blood cultures remain negative. Health department will be notified via infection control.  Mild thrombocytopenia  Mild anemia  Plan:  Change to oral Cipro 500 mg twice a day. Complete total seven-day course. Literature/uptodate reviewed--antibiotics suggested in this patient given age, fever and profuse diarrhea during this hospitalization.  Consider repeat CBC as an outpatient to followup mild thrombocytopenia likely secondary to acute illness  Anticipate discharge home later today.  Discussed importance of hygiene with the patient as shedding can persist for some time.  Hold hydrochlorothiazide until diarrhea resolved. Blood pressure well controlled off this medication.  Summary:  67 year old woman who developed sudden fever and watery diarrhea in 24 hours prior to admission. No abdominal pain. Admitted for further evaluation. Over the course of several days fever resolved the diarrhea has improved. GI pathogen panel positive for Salmonella and infection control will follow up  with health Department.   Brendia Sacks, MD [facility] [phone]

## 2013-05-24 NOTE — Progress Notes (Signed)
[facility] PROGRESS NOTE  [name] ZHY:[id number] DOB: 1946-10-31 DOA: 05/21/2013 PCP: Colette Ribas, MD  Assessment/Plan: #1 acute diarrhea . GI pathogen panel + for salmonella.  Continues with watery stools but not as frequent. Stool for C. Difficile neg.  White count within normal limits. 99.8  Discontinue Flagyl. Continue Cipro day #4.  Continue supportive treatment. Will decrease IV rate as tolerating clear liquids better. May be discharged later today once confirmed that she can maintain hydration. Of note, Infection Prevention department notified of dx and will be following up with Health Department as required.     #2 fever: Most likely secondary to the above. Max temp 99.8. Will continue ciprofloxacin day #4. Urine cultures with no growth. Blood cultures with no growth. Lactic acid 2.1.  Pt remains non-toxic appearing.    #3 abnormal UA: continue Cipro day #4. Urine cultures no growth.   #4 dehydration with hyperkalemia: continue IV fluids at slower rate. Potassium level improved but remains outside normal limits. Will replete. Mag level within normal limits.    #5 hiatal hernia with acid reflux: Stable. Continue with proton pump inhibitors.   #6 history of hypertension: SBP range 113-135. Home anti-hypertensive agent HCTZ. Continue to hold. Monitor BP and add different anti-hypertensive as indicated.   #7. Hypokalemia: see #4. Recheck in am.   #8. Tachycardia: related to above. Resolved this am.    Code Status: full Family Communication:  Disposition Plan: home likely this afternoon  Consultants:  none  Procedures:  none  Antibiotics:  cipro 05/21/13>>>05/27/13  Flagyl 05/22/13-05/23/13  HPI/Subjective: Sitting up in bed finishing clear liquid breakfast. Denies pain/discomfort. States slightly less stools and able to tolerate clear liquids better.   Objective: Filed Vitals:   05/23/13 0550 05/23/13 1400 05/23/13 2100 05/24/13 0518  BP: 127/57  123/54 129/60 113/59  Pulse: 103 91 95 80  Temp: 102.6 F (39.2 C) 99.4 F (37.4 C) 99.8 F (37.7 C) 98.1 F (36.7 C)  TempSrc:  Oral  Oral  Resp: 20 20 20 20   Height:      Weight:      SpO2: 97% 98% 97% 98%    Intake/Output Summary (Last 24 hours) at 05/24/13 0833 Last data filed at 05/24/13 0500  Gross per 24 hour  Intake   2190 ml  Output      0 ml  Net   2190 ml   Filed Weights   05/21/13 1729  Weight: 87.544 kg (193 lb)   Exam:   General:  Well nourished, NAD  Cardiovascular: RRR No MGR  Trace LE edema  Respiratory: normal effort BS clear bilaterally no wheeze no rhonchi  Abdomen: obese soft +BS non-tender to palpation  Musculoskeletal: no clubbing no cyanosis   Data Reviewed: Basic Metabolic Panel:  Recent Labs Lab 05/21/13 1851 05/22/13 0456 05/23/13 0500 05/24/13 0455  NA 137 136 136 138  K 2.9* 3.6 2.8* 3.1*  CL 103 102 104 107  CO2 24 23 22 23   GLUCOSE 139* 141* 117* 105*  BUN 17 16 11 7   CREATININE 0.88 0.98 0.88 0.76  CALCIUM 8.7 8.0* 7.6* 7.2*  MG  --  1.5  --  1.7   Liver Function Tests:  Recent Labs Lab 05/21/13 1851 05/22/13 0456  AST 16 23  ALT 19 23  ALKPHOS 81 80  BILITOT 0.3 0.4  PROT 6.3 6.3  ALBUMIN 3.6 3.4*    Recent Labs Lab 05/21/13 1851  LIPASE 36   No results found for  this basename: AMMONIA,  in the last 168 hours CBC:  Recent Labs Lab 05/21/13 1851 05/22/13 0456 05/23/13 0500 05/24/13 0455  WBC 13.4* 14.1* 7.0 4.4  NEUTROABS 12.0*  --   --   --   HGB 12.9 13.6 12.3 11.7*  HCT 38.5 40.6 37.2 35.3*  MCV 86.7 88.6 86.5 85.9  PLT 209 231 153 143*   Cardiac Enzymes: No results found for this basename: CKTOTAL, CKMB, CKMBINDEX, TROPONINI,  in the last 168 hours BNP (last 3 results) No results found for this basename: PROBNP,  in the last 8760 hours CBG: No results found for this basename: GLUCAP,  in the last 168 hours  Recent Results (from the past 240 hour(s))  URINE CULTURE     Status: None    Collection Time    05/21/13 10:40 PM      Result Value Range Status   Specimen Description URINE, CATHETERIZED   Final   Special Requests NONE   Final   Culture  Setup Time 05/21/2013 22:50   Final   Colony Count NO GROWTH   Final   Culture NO GROWTH   Final   Report Status 05/22/2013 FINAL   Final  CULTURE, BLOOD (ROUTINE X 2)     Status: None   Collection Time    05/22/13 12:40 AM      Result Value Range Status   Specimen Description BLOOD LEFT ANTECUBITAL   Final   Special Requests     Final   Value: BOTTLES DRAWN AEROBIC AND ANAEROBIC AEB=5CC ANA=4CC   Culture NO GROWTH 1 DAY   Final   Report Status PENDING   Incomplete  CULTURE, BLOOD (ROUTINE X 2)     Status: None   Collection Time    05/22/13 12:53 AM      Result Value Range Status   Specimen Description BLOOD LEFT ANTECUBITAL   Final   Special Requests     Final   Value: BOTTLES DRAWN AEROBIC AND ANAEROBIC AEB=12CC ANA=10CC   Culture NO GROWTH 1 DAY   Final   Report Status PENDING   Incomplete  CLOSTRIDIUM DIFFICILE BY PCR     Status: None   Collection Time    05/22/13  5:11 AM      Result Value Range Status   C difficile by pcr NEGATIVE  NEGATIVE Final     Studies: No results found.  Scheduled Meds: . acidophilus  2 capsule Oral Daily  . ciprofloxacin  500 mg Oral BID  . diazepam  2.5 mg Oral BID  . enoxaparin (LOVENOX) injection  40 mg Subcutaneous Q24H  . gabapentin  300 mg Oral QHS  . montelukast  10 mg Oral q morning - 10a  . pantoprazole  40 mg Oral BID AC  . potassium chloride  40 mEq Oral BID   Continuous Infusions: . sodium chloride 150 mL/hr at 05/24/13 0017   Principal Problem:   Acute gastroenteritis Active Problems:   HYPERTENSION   GERD   HIATAL HERNIA   Fever   Abnormal urinalysis   Hypokalemia   Dehydration   Salmonella    Time spent: 30 minutes    [facility] M  [facility] Pager [phone]. If 7PM-7AM, please contact night-coverage at [email or web address], password  [email or web address] 05/24/2013, 8:33 AM  LOS: 3 days

## 2013-05-24 NOTE — Discharge Summary (Signed)
Physician Discharge Summary  [name] FAO:[id number] DOB: [birth year] DOA: 05/21/2013  PCP: Colette Ribas, MD  Admit date: 05/21/2013 Discharge date: 05/24/2013  Time spent: 40 minutes  Recommendations for Outpatient Follow-up:  1. Follow up with PCP 1-2 weeks. Recommend cbc and bmet to trend anemia, platelets and potassium level.   Discharge Diagnoses:  Principal Problem:   Acute gastroenteritis Active Problems:   HYPERTENSION   GERD   HIATAL HERNIA   Anemia   Fever   Abnormal urinalysis   Hypokalemia   Dehydration   Salmonella   Thrombocytopenia, unspecified   Discharge Condition: good  Diet recommendation: clear liquids to advance as tolerated  Filed Weights   05/21/13 1729  Weight: 87.544 kg (193 lb)    History of present illness:  [name] is a 67 y.o. female with a past medical history of hiatal hernia, hypertension, who was in her usual state of health till about 6:30am on 05/21/13  when she woke up with urgency to have a bowel movement. This was followed by onset of her diarrhea. It had been a watery stool, brown in color, without any blood. Denied any mucus. She had about 7-8 episodes that day. Denied being on antibiotics recently. Did have a fever of 103 at home. Had some nausea, but denied any vomiting. Denied any abdominal pain. Had a headache. Had some acid reflux and complained of some chest discomfort, which she usually has with her hiatal hernia. Had decreased quantity of urine that day. Denied any dysuria. She's never had similar symptoms in the past. She had colonoscopy, but could not remember when it was done. Denied any recent surgical procedures. She  mentioned, that she ate at [employer] the previous night. Had salad, soup and bread sticks. Other family members also went with her but nobody had the salad.   Hospital Course:  #1 acute diarrhea . GI pathogen panel + for salmonella. Continues with watery stools but not as frequent. Stool for  C. Difficile neg. White count within normal limits. Temp 99.8 Discontinue Flagyl. Continue Cipro day #4 and will continue for 3 more days for total 7 days. At discharge pt tolerating clear liquids and has been instructed to advance diet as tolerated.  Of note, Infection Prevention department notified of dx and will be following up with Health Department as required.   #2 fever: Most likely secondary to the above. Max temp 99.8. Will continue ciprofloxacin to complete 7 day course.  Urine cultures with no growth. Blood cultures with no growth. Lactic acid 2.1. Pt remained non-toxic appearing during this hospitalization  #3 abnormal UA: continue Cipro as above. Urine cultures no growth.   #4 dehydration with hyperkalemia: Related to #1. At discharge pt tolerating po fluids well.  Potassium level improved but remains outside normal limits. Will replete. Mag level within normal limits. Recommend follow up bmet with PCP in 1-2 weeks.   #5 hiatal hernia with acid reflux: Stable. Continue with proton pump inhibitors.   #6 history of hypertension: SBP range 113-135 without anti-hypertensive meds. Home anti-hypertensive agent HCTZ on hold during hospitalization due to #1 and #4. Instructed patient to hold HCTZ until diarrhea has resolved at which time she can resume.   #7. Hypokalemia: see #4. Potassium level 3.1 at discharge. Pt given x2. Recommend OP bmet with PCP.   #8. Tachycardia: related to above. Resolved at discharge \  #9. Thrombocytopenia: follow up OP  #10: anemia: mild. No s/sx bleeding. Follow up OP  Procedures: none Consultations:  none  Discharge Exam: Filed Vitals:   05/23/13 0550 05/23/13 1400 05/23/13 2100 05/24/13 0518  BP: 127/57 123/54 129/60 113/59  Pulse: 103 91 95 80  Temp: 102.6 F (39.2 C) 99.4 F (37.4 C) 99.8 F (37.7 C) 98.1 F (36.7 C)  TempSrc:  Oral  Oral  Resp: 20 20 20 20   Height:      Weight:      SpO2: 97% 98% 97% 98%    General: well  nourished. NAD Cardiovascular: RRR No MGR trace LE edema Respiratory: normal effort BS clear bilaterally no wheeze no rhonchi Abdomen: soft +BS non-tender to palpation  Discharge Instructions      Discharge Orders   Future Appointments Provider Department Dept Phone   05/28/2013 2:30 PM Matilde Haymaker, PT [facility] [phone]   Future Orders Complete By Expires     Call MD for:  extreme fatigue  As directed     Call MD for:  persistant dizziness or light-headedness  As directed     Call MD for:  persistant nausea and vomiting  As directed     Diet - low sodium heart healthy  As directed     Discharge instructions  As directed     Comments:      Hold HCTZ until diarrhea resolved. Once diarrhea resolved, resume HCTZ Advance diet slowly as tolerated Drink fluids    Increase activity slowly  As directed         Medication List    TAKE these medications       alendronate 70 MG tablet  Commonly known as:  FOSAMAX  Take 70 mg by mouth every 7 (seven) days. Take with a full glass of water on an empty stomach. *takes on Saturdays     aspirin EC 81 MG tablet  Take 81 mg by mouth every morning.     ciprofloxacin 500 MG tablet  Commonly known as:  CIPRO  Take 1 tablet (500 mg total) by mouth 2 (two) times daily.     diazepam 5 MG tablet  Commonly known as:  VALIUM  Take 2.5 mg by mouth 2 (two) times daily.     esomeprazole 40 MG capsule  Commonly known as:  NEXIUM  Take 1 capsule (40 mg total) by mouth daily before breakfast.     ferrous sulfate 325 (65 FE) MG tablet  Take 325 mg by mouth daily with breakfast.     gabapentin 300 MG capsule  Commonly known as:  NEURONTIN  Take 300 mg by mouth at bedtime.     hydrochlorothiazide 25 MG tablet  Commonly known as:  HYDRODIURIL  Take 12.5 mg by mouth daily.     montelukast 10 MG tablet  Commonly known as:  SINGULAIR  Take 10 mg by mouth every morning.     PROBIOTIC FORMULA Caps  Take 1 capsule  by mouth daily.     Vitamin D 2000 UNITS Caps  Take 1 capsule by mouth daily.       Allergies  Allergen Reactions  . Nsaids Other (See Comments)    Caused Ulcers.  . Cephalexin     unknown  . Darvocet (Propoxyphene-Acetaminophen)     nausea  . Percocet (Oxycodone-Acetaminophen) Nausea Only  . Penicillins Rash and Other (See Comments)    Redness   Follow-up Information   Follow up with Colette Ribas, MD. Schedule an appointment as soon as possible for a visit in 2 weeks. (recommend  cbc to trend platelet count and anemia and bmet to trend potassium level. )    Contact information:   [address] [place] [address] [phone]        The results of significant diagnostics from this hospitalization (including imaging, microbiology, ancillary and laboratory) are listed below for reference.    Significant Diagnostic Studies: Dg Ribs Unilateral Right  05/13/2013   *RADIOLOGY REPORT*  Clinical Data: Right-sided rib pain, no injury  RIGHT RIBS - 2 VIEW  Comparison: Chest x-ray of 05/24/2012  Findings: Right rib detail films show no acute right rib fractures. No pneumothorax is noted and no pleural effusion is seen.  IMPRESSION: Negative.   Original Report Authenticated By: Dwyane Dee, M.D.    Microbiology: Recent Results (from the past 240 hour(s))  URINE CULTURE     Status: None   Collection Time    05/21/13 10:40 PM      Result Value Range Status   Specimen Description URINE, CATHETERIZED   Final   Special Requests NONE   Final   Culture  Setup Time 05/21/2013 22:50   Final   Colony Count NO GROWTH   Final   Culture NO GROWTH   Final   Report Status 05/22/2013 FINAL   Final  CULTURE, BLOOD (ROUTINE X 2)     Status: None   Collection Time    05/22/13 12:40 AM      Result Value Range Status   Specimen Description BLOOD LEFT ANTECUBITAL   Final   Special Requests     Final   Value: BOTTLES DRAWN AEROBIC AND ANAEROBIC AEB=5CC ANA=4CC    Culture NO GROWTH 1 DAY   Final   Report Status PENDING   Incomplete  CULTURE, BLOOD (ROUTINE X 2)     Status: None   Collection Time    05/22/13 12:53 AM      Result Value Range Status   Specimen Description BLOOD LEFT ANTECUBITAL   Final   Special Requests     Final   Value: BOTTLES DRAWN AEROBIC AND ANAEROBIC AEB=12CC ANA=10CC   Culture NO GROWTH 1 DAY   Final   Report Status PENDING   Incomplete  CLOSTRIDIUM DIFFICILE BY PCR     Status: None   Collection Time    05/22/13  5:11 AM      Result Value Range Status   C difficile by pcr NEGATIVE  NEGATIVE Final  STOOL CULTURE     Status: None   Collection Time    05/22/13  6:00 PM      Result Value Range Status   Specimen Description STOOL   Final   Special Requests NONE   Final   Culture Culture reincubated for better growth   Final   Report Status PENDING   Incomplete     Labs: Basic Metabolic Panel:  Recent Labs Lab 05/21/13 1851 05/22/13 0456 05/23/13 0500 05/24/13 0455  NA 137 136 136 138  K 2.9* 3.6 2.8* 3.1*  CL 103 102 104 107  CO2 24 23 22 23   GLUCOSE 139* 141* 117* 105*  BUN 17 16 11 7   CREATININE 0.88 0.98 0.88 0.76  CALCIUM 8.7 8.0* 7.6* 7.2*  MG  --  1.5  --  1.7   Liver Function Tests:  Recent Labs Lab 05/21/13 1851 05/22/13 0456  AST 16 23  ALT 19 23  ALKPHOS 81 80  BILITOT 0.3 0.4  PROT 6.3 6.3  ALBUMIN 3.6 3.4*  Recent Labs Lab 05/21/13 1851  LIPASE 36   No results found for this basename: AMMONIA,  in the last 168 hours CBC:  Recent Labs Lab 05/21/13 1851 05/22/13 0456 05/23/13 0500 05/24/13 0455  WBC 13.4* 14.1* 7.0 4.4  NEUTROABS 12.0*  --   --   --   HGB 12.9 13.6 12.3 11.7*  HCT 38.5 40.6 37.2 35.3*  MCV 86.7 88.6 86.5 85.9  PLT 209 231 153 143*   Cardiac Enzymes: No results found for this basename: CKTOTAL, CKMB, CKMBINDEX, TROPONINI,  in the last 168 hours BNP: BNP (last 3 results) No results found for this basename: PROBNP,  in the last 8760 hours CBG: No  results found for this basename: GLUCAP,  in the last 168 hours     Signed:  Gwenyth Bender  [facility] 05/24/2013, 11:14 AM

## 2013-05-24 NOTE — Discharge Summary (Signed)
Patient seen. Agree with discharge. See my progress note dated today.  Brendia Sacks, MD [facility] [phone]

## 2013-05-27 LAB — CULTURE, BLOOD (ROUTINE X 2)
Culture: NO GROWTH
Culture: NO GROWTH

## 2013-05-28 ENCOUNTER — Ambulatory Visit (HOSPITAL_COMMUNITY): Payer: 59 | Admitting: Physical Therapy

## 2013-05-28 ENCOUNTER — Telehealth (HOSPITAL_COMMUNITY): Payer: Self-pay

## 2013-07-15 ENCOUNTER — Ambulatory Visit (HOSPITAL_COMMUNITY)
Admission: RE | Admit: 2013-07-15 | Discharge: 2013-07-15 | Disposition: A | Discharge status: Home / Self Care | Payer: 59 | Source: Ambulatory Visit | Attending: Physical Medicine and Rehabilitation | Admitting: Physical Medicine and Rehabilitation

## 2013-07-15 ENCOUNTER — Other Ambulatory Visit (HOSPITAL_COMMUNITY): Payer: Self-pay | Admitting: Pulmonary Disease

## 2013-07-15 DIAGNOSIS — R918 Other nonspecific abnormal finding of lung field: Secondary | ICD-10-CM

## 2013-07-15 DIAGNOSIS — M545 Low back pain, unspecified: Secondary | ICD-10-CM | POA: Insufficient documentation

## 2013-07-15 DIAGNOSIS — IMO0001 Reserved for inherently not codable concepts without codable children: Secondary | ICD-10-CM | POA: Insufficient documentation

## 2013-07-15 DIAGNOSIS — I10 Essential (primary) hypertension: Secondary | ICD-10-CM | POA: Insufficient documentation

## 2013-07-15 DIAGNOSIS — M533 Sacrococcygeal disorders, not elsewhere classified: Secondary | ICD-10-CM
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
## 2013-07-18 ENCOUNTER — Ambulatory Visit (HOSPITAL_COMMUNITY)
Admission: RE | Admit: 2013-07-18 | Discharge: 2013-07-18 | Disposition: A | Discharge status: Home / Self Care | Payer: 59 | Source: Ambulatory Visit | Attending: Pulmonary Disease | Admitting: Pulmonary Disease

## 2013-07-18 ENCOUNTER — Ambulatory Visit (HOSPITAL_COMMUNITY)
Admission: RE | Admit: 2013-07-18 | Discharge: 2013-07-18 | Disposition: A | Discharge status: Home / Self Care | Payer: 59 | Source: Ambulatory Visit | Attending: Physical Medicine and Rehabilitation | Admitting: Physical Medicine and Rehabilitation

## 2013-07-18 DIAGNOSIS — M533 Sacrococcygeal disorders, not elsewhere classified: Secondary | ICD-10-CM

## 2013-07-18 DIAGNOSIS — R918 Other nonspecific abnormal finding of lung field: Secondary | ICD-10-CM | POA: Insufficient documentation

## 2013-07-18 NOTE — Progress Notes (Signed)
Physical Therapy Treatment Patient Details  Name: [name] MRN: [id number] Date of Birth: 11-18-1946  Today's Date: 07/18/2013 Time: 4098-1191 PT Time Calculation (min): 42 min  Visit#: 2 of 8  Re-eval: 08/14/13 Charges: Manual x 8' [phone]) Therex x 30' (1612-1642)  Authorization: [employer]   Subjective: Symptoms/Limitations Symptoms: Pt stated minimal pain today, has been completeing HEP dailly.  Reported she got out with wrong technique and has pain in middle of lower back.   Pain Assessment Currently in Pain?: Yes Pain Score: 3  Pain Location: Back Pain Orientation: Lower   Exercise/Treatments Standing Scapular Retraction: 10 reps;Theraband Theraband Level (Scapular Retraction): Level 3 (Green) Row: 10 reps;Theraband Theraband Level (Row): Level 3 (Green) Shoulder Extension: 10 reps;Theraband Theraband Level (Shoulder Extension): Level 3 (Green) Seated Other Seated Lumbar Exercises: Hip roll in/out 5x5" Other Seated Lumbar Exercises: Shoulder rolls x 10; Scapular retraction x 10 Supine Ab Set: 10 reps Clam: 10 reps Bridge: 10 reps Prone  Straight Leg Raise: 10 reps Other Prone Lumbar Exercises: Heel squeeze 10x5"  Manual Therapy Other Manual Therapy: Check for SI alignment, no MET required this session.  1308-6578  Physical Therapy Assessment and Plan PT Assessment and Plan Clinical Impression Statement: SI is in alignment. Pt completes stabilization exercises well after initial cueing for core contraction. Pt requires multimodal cueing to improve form with scapular tband exercises. Pt reports increased mm soreness with exercises but no increase in back pain at end of session. Pt will benefit from skilled therapeutic intervention in order to improve on the following deficits: Pain;Improper spinal/pelvic alignment;Improper body mechanics;Increased muscle spasms;Increased fascial restricitons;Decreased coordination;Impaired flexibility Rehab Potential: Good PT  Frequency: Min 2X/week PT Duration: 4 weeks PT Treatment/Interventions: Therapeutic activities;Therapeutic exercise;Neuromuscular re-education;Patient/family education;Manual techniques;Modalities PT Plan: Continue to progress postural strength and decrease pain per PT POC.      Problem List Patient Active Problem List   Diagnosis Date Noted  . Salmonella 05/24/2013  . Thrombocytopenia, unspecified 05/24/2013  . Acute gastroenteritis 05/21/2013  . Fever 05/21/2013  . Abnormal urinalysis 05/21/2013  . Hypokalemia 05/21/2013  . Dehydration 05/21/2013  . Coccydynia 05/07/2013  . Anemia 08/06/2012  . PUD (peptic ulcer disease) 02/06/2012  . OVERWEIGHT/OBESITY 12/21/2010  . HYPERTENSION 12/29/2009  . 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 GERD 12/29/2009  . HIATAL HERNIA 12/29/2009  . HEART MURMUR, SYSTOLIC 12/29/2009    PT - End of Session Activity Tolerance: Patient tolerated treatment well General Behavior During Therapy: [facility] for tasks assessed/performed  Seth Bake, PTA 07/18/2013, 4:47 PM

## 2013-07-23 ENCOUNTER — Ambulatory Visit (HOSPITAL_COMMUNITY)
Admission: RE | Admit: 2013-07-23 | Discharge: 2013-07-23 | Disposition: A | Discharge status: Home / Self Care | Payer: 59 | Source: Ambulatory Visit | Attending: Family Medicine | Admitting: Family Medicine

## 2013-07-23 LAB — STOOL CULTURE

## 2013-07-23 NOTE — Progress Notes (Signed)
Physical Therapy Treatment Patient Details  Name: [name] MRN: [id number] Date of Birth: 07-23-1946  Today's Date: 07/23/2013 Time: 1301-1340 PT Time Calculation (min): 39 min Visit#: 3 of 8  Re-eval: 08/14/13 Authorization: [employer]  Charges:  therex 38'  Subjective: Symptoms/Limitations Symptoms: Pt states her back is not hurting, however her knee has been bothering her.     Exercise/Treatments Aerobic Stationary Bike: NuStep 8', seat 10 hills #3, level 3 Standing Scapular Retraction: 10 reps Theraband Level (Scapular Retraction): Level 3 (Green) Row: 10 reps;Theraband Theraband Level (Row): Level 3 (Green) Shoulder Extension: 10 reps;Theraband Theraband Level (Shoulder Extension): Level 3 (Green) Seated Other Seated Lumbar Exercises: Hip roll in/out 10x5" Other Seated Lumbar Exercises: Shoulder rolls x 10; Scapular retraction x 10 Supine Ab Set: 10 reps Clam: 10 reps Bridge: 10 reps Straight Leg Raise: 10 reps Prone  Single Arm Raise: 5 reps Straight Leg Raise: 5 reps     Physical Therapy Assessment and Plan PT Assessment and Plan Clinical Impression Statement: No lumbar/SI discomfort or malignment.  Added nustep to decrease knee discomfort/loosen joint prior to therex.  Pt able to recall all established therex in supine. continued need for  verbal cues with theraband exercises.  Difficulty stabilizing in prone due to weakness.   Pt will benefit from skilled therapeutic intervention in order to improve on the following deficits: Pain;Improper spinal/pelvic alignment;Improper body mechanics;Increased muscle spasms;Increased fascial restricitons;Decreased coordination;Impaired flexibility PT Plan: Continue to progress postural strength and decrease pain per PT POC.      Problem List Patient Active Problem List   Diagnosis Date Noted  . Salmonella 05/24/2013  . Thrombocytopenia, unspecified 05/24/2013  . Acute gastroenteritis 05/21/2013  . Fever 05/21/2013  .  Abnormal urinalysis 05/21/2013  . Hypokalemia 05/21/2013  . Dehydration 05/21/2013  . Coccydynia 05/07/2013  . Anemia 08/06/2012  . PUD (peptic ulcer disease) 02/06/2012  . OVERWEIGHT/OBESITY 12/21/2010  . HYPERTENSION 12/29/2009  . 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 GERD 12/29/2009  . HIATAL HERNIA 12/29/2009  . HEART MURMUR, SYSTOLIC 12/29/2009    PT - End of Session Activity Tolerance: Patient tolerated treatment well General Behavior During Therapy: WFL for tasks assessed/performed   Lurena Nida, PTA/CLT 07/23/2013, 1:42 PM

## 2013-07-25 ENCOUNTER — Ambulatory Visit (HOSPITAL_COMMUNITY)
Admission: RE | Admit: 2013-07-25 | Discharge: 2013-07-25 | Disposition: A | Discharge status: Home / Self Care | Payer: 59 | Source: Ambulatory Visit | Attending: Family Medicine | Admitting: Family Medicine

## 2013-07-25 DIAGNOSIS — M533 Sacrococcygeal disorders, not elsewhere classified: Secondary | ICD-10-CM

## 2013-07-25 NOTE — Progress Notes (Signed)
Physical Therapy Treatment Patient Details  Name: [name] MRN: [id number] Date of Birth: 25-Apr-1946  Today's Date: 07/25/2013 Time: 4098-1191 PT Time Calculation (min): 52 min Charge:  There ex 1300-1345 (3); pt placed on Nustep at 1345 Visit#: 4 of 8  Re-eval: 08/14/13   Authorization: [employer]   Subjective: Symptoms/Limitations Symptoms: Pt is feeling better; does exercies twice a day Pain Assessment Currently in Pain?: Yes Pain Score: 2     Exercise/Treatments  Stretches Active Hamstring Stretch: 3 reps;30 seconds Single Knee to Chest Stretch: 3 reps;30 seconds Aerobic Stationary Bike: Nustep L 3 hills done last   Standing Wall Slides: 10 reps Scapular Retraction: 10 reps;Theraband Theraband Level (Scapular Retraction): Level 3 (Green) Row: 10 reps;Theraband Theraband Level (Row): Level 3 (Green) Shoulder Extension: Theraband Theraband Level (Shoulder Extension): Level 3 (Green) Other Standing Lumbar Exercises: B UE flextion x 10   Supine Dead Bug: 10 reps Bridge: 10 reps Straight Leg Raise: 10 reps (floating) Sidelying Clam: 10 reps Hip Abduction: 10 reps Prone  Opposite Arm/Leg Raise: 10 reps Other Prone Lumbar Exercises: double arm raise x 10    Physical Therapy Assessment and Plan PT Assessment and Plan Clinical Impression Statement: Pt continues to have knee pain; lumbar pain continues to ease.  Pt given t-band for home postural exercises; added standing ex for posture; sidelying exercises for strengthening. PT Frequency: Min 2X/week PT Duration: 4 weeks PT Treatment/Interventions: Therapeutic activities;Therapeutic exercise;Neuromuscular re-education;Patient/family education;Manual techniques;Modalities PT Plan: Continue to progress postural strength and decrease pain per PT POC. Do nustep last so pt is able to complete full program; begin standing lunge and body mechanics for lifting    Goals  progressing  Problem List Patient Active Problem  List   Diagnosis Date Noted  . Salmonella 05/24/2013  . Thrombocytopenia, unspecified 05/24/2013  . Acute gastroenteritis 05/21/2013  . Fever 05/21/2013  . Abnormal urinalysis 05/21/2013  . Hypokalemia 05/21/2013  . Dehydration 05/21/2013  . Coccydynia 05/07/2013  . Anemia 08/06/2012  . PUD (peptic ulcer disease) 02/06/2012  . OVERWEIGHT/OBESITY 12/21/2010  . HYPERTENSION 12/29/2009  . 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 GERD 12/29/2009  . HIATAL HERNIA 12/29/2009  . HEART MURMUR, SYSTOLIC 12/29/2009    PT - End of Session Activity Tolerance: Patient tolerated treatment well General Behavior During Therapy: WFL for tasks assessed/performed Cognition: WFL for tasks performed PT Plan of Care PT Home Exercise Plan: given new t-band and ex  GP    Corrin Hingle,CINDY 07/25/2013, 3:31 PM

## 2013-07-30 ENCOUNTER — Ambulatory Visit (HOSPITAL_COMMUNITY)
Admission: RE | Admit: 2013-07-30 | Discharge: 2013-07-30 | Disposition: A | Discharge status: Home / Self Care | Payer: 59 | Source: Ambulatory Visit | Attending: Family Medicine | Admitting: Family Medicine

## 2013-07-30 DIAGNOSIS — M533 Sacrococcygeal disorders, not elsewhere classified: Secondary | ICD-10-CM | POA: Insufficient documentation

## 2013-07-30 DIAGNOSIS — IMO0001 Reserved for inherently not codable concepts without codable children: Secondary | ICD-10-CM | POA: Insufficient documentation

## 2013-07-30 NOTE — Progress Notes (Signed)
Physical Therapy Treatment Patient Details  Name: [name] MRN: [id number] Date of Birth: September 21, 1946  Today's Date: 07/30/2013 Time: 1302-1348 PT Time Calculation (min): 46 min  Visit#: 5 of 8  Re-eval: 08/14/13 Charges: Therex x [phone])  Authorization: [employer]   Subjective: Symptoms/Limitations Symptoms: Pt stated that she drove to [facility] today and sitting for that long aggravates her back.  Pain Assessment Currently in Pain?: Yes Pain Score: 5  Pain Location: Back Pain Orientation: Lower  Precautions/Restrictions     Exercise/Treatments  Stretches Active Hamstring Stretch: 1 rep;30 seconds (Review for HEP) Single Knee to Chest Stretch: 1 rep;30 seconds (Review for HEP) Aerobic Stationary Bike: Nustep L3 hills #3 done at end of session Standing Wall Slides: 10 reps Scapular Retraction: 10 reps;Theraband Theraband Level (Scapular Retraction): Level 3 (Green) Row: 10 reps;Theraband Theraband Level (Row): Level 3 (Green) Shoulder Extension: Theraband Theraband Level (Shoulder Extension): Level 3 (Green) Other Standing Lumbar Exercises: B UE flextion x 10 Supine Bridge: 10 reps Straight Leg Raise: 10 reps Large Ball Abdominal Isometric: 10 reps;5 seconds Large Ball Oblique Isometric: 10 reps;5 seconds Sidelying Clam: 10 reps Hip Abduction: 10 reps Prone  Opposite Arm/Leg Raise: 10 reps Other Prone Lumbar Exercises: double arm raise x 10  Physical Therapy Assessment and Plan PT Assessment and Plan Clinical Impression Statement: Pt with elevated pain level today secondary to long car ride. Pt completes therex well with minimal need for cueing after initial cueing and demo. Progressed to abdominal isometrics with physioball with minimal difficulty after cueing for technique. Pt reports pain decrease to 3/10 at end of session.  PT Frequency: Min 2X/week PT Duration: 4 weeks PT Treatment/Interventions: Therapeutic activities;Therapeutic  exercise;Neuromuscular re-education;Patient/family education;Manual techniques;Modalities PT Plan: Continue to progress postural strength and decrease pain per PT POC. Complete nustep last so pt is able to complete full program; begin standing lunge and body mechanics for lifting.     Problem List Patient Active Problem List   Diagnosis Date Noted  . Salmonella 05/24/2013  . Thrombocytopenia, unspecified 05/24/2013  . Acute gastroenteritis 05/21/2013  . Fever 05/21/2013  . Abnormal urinalysis 05/21/2013  . Hypokalemia 05/21/2013  . Dehydration 05/21/2013  . Coccydynia 05/07/2013  . Anemia 08/06/2012  . PUD (peptic ulcer disease) 02/06/2012  . OVERWEIGHT/OBESITY 12/21/2010  . HYPERTENSION 12/29/2009  . 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 GERD 12/29/2009  . HIATAL HERNIA 12/29/2009  . HEART MURMUR, SYSTOLIC 12/29/2009    PT - End of Session Activity Tolerance: Patient tolerated treatment well General Behavior During Therapy: WFL for tasks assessed/performed Cognition: WFL for tasks performed PT Plan of Care PT Home Exercise Plan: HEP given for knee to chest and active hamstring stetch  Seth Bake, PTA 07/30/2013, 1:57 PM

## 2013-08-01 ENCOUNTER — Ambulatory Visit (HOSPITAL_COMMUNITY)
Admission: RE | Admit: 2013-08-01 | Discharge: 2013-08-01 | Disposition: A | Discharge status: Home / Self Care | Payer: 59 | Source: Ambulatory Visit | Attending: Family Medicine | Admitting: Family Medicine

## 2013-08-01 NOTE — Progress Notes (Signed)
Physical Therapy Treatment Patient Details  Name: [name] MRN: [id number] Date of Birth: Jan 16, 1946  Today's Date: 08/01/2013 Time: 9629-5284 PT Time Calculation (min): 40 min Visit#: 6 of 8  Re-eval: 08/14/13 Authorization: [facility]  Charges:  therex 38'    Subjective: Symptoms/Limitations Symptoms: Pt states her pain is overall decreased today, 2/10 today. Pain Assessment Currently in Pain?: Yes Pain Score: 2  Pain Location: Back Pain Orientation: Lower   Exercise/Treatments Aerobic Stationary Bike: Nustep L3 hills #3 done at end of session Standing Forward Lunge: 5 reps;Limitations Forward Lunge Limitations: bilateral Wall Slides: 10 reps Scapular Retraction: 15 reps;Theraband Theraband Level (Scapular Retraction): Level 3 (Green) Row: 15 reps;Theraband Theraband Level (Row): Level 3 (Green) Shoulder Extension: 15 reps;Theraband Theraband Level (Shoulder Extension): Level 3 (Green) Other Standing Lumbar Exercises: B UE flexion x 10 Supine Bridge: 15 reps Straight Leg Raise: 15 reps Large Ball Abdominal Isometric: 10 reps;5 seconds Large Ball Oblique Isometric: 10 reps;5 seconds Sidelying Clam: 15 reps Hip Abduction: 15 reps Prone  Opposite Arm/Leg Raise: 10 reps Other Prone Lumbar Exercises: double arm raise x 10     Physical Therapy Assessment and Plan PT Assessment and Plan Clinical Impression Statement: Progressed reps and added lunges to POC with overall good form.  Pt with improved symptoms today with minimal cues needed for established exercise. PT Plan: Continue to progress postural strength and decrease pain per PT POC. Complete nustep last so pt is able to complete full program; begin body mechanics for lifting.     Problem List Patient Active Problem List   Diagnosis Date Noted  . Salmonella 05/24/2013  . Thrombocytopenia, unspecified 05/24/2013  . Acute gastroenteritis 05/21/2013  . Fever 05/21/2013  . Abnormal urinalysis 05/21/2013  .  Hypokalemia 05/21/2013  . Dehydration 05/21/2013  . Coccydynia 05/07/2013  . Anemia 08/06/2012  . PUD (peptic ulcer disease) 02/06/2012  . OVERWEIGHT/OBESITY 12/21/2010  . HYPERTENSION 12/29/2009  . 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 GERD 12/29/2009  . HIATAL HERNIA 12/29/2009  . HEART MURMUR, SYSTOLIC 12/29/2009    PT - End of Session Activity Tolerance: Patient tolerated treatment well General Behavior During Therapy: WFL for tasks assessed/performed Cognition: WFL for tasks performed   Lurena Nida, PTA/CLT 08/01/2013, 2:31 PM

## 2013-08-06 ENCOUNTER — Ambulatory Visit (HOSPITAL_COMMUNITY)
Admission: RE | Admit: 2013-08-06 | Discharge: 2013-08-06 | Disposition: A | Discharge status: Home / Self Care | Payer: 59 | Source: Ambulatory Visit | Attending: Family Medicine | Admitting: Family Medicine

## 2013-08-06 NOTE — Progress Notes (Signed)
Physical Therapy Treatment Patient Details  Name: [name] MRN: [id number] Date of Birth: [birth year]  Today's Date: 08/06/2013 Time: 8295-6213 PT Time Calculation (min): 40 min  Visit#: 7 of 8  Re-eval: 08/14/13 Charges: Therex x 28' Manual x 10'  Authorization: [employer]    Subjective: Symptoms/Limitations Symptoms: Pt states that she had shot put in left her knee today. Pain Assessment Currently in Pain?: Yes Pain Score: 5  Pain Location: Back   Exercise/Treatments Aerobic Tread Mill: 5'@1 .0 with 5 incline after MET Standing Wall Slides: 10 reps Scapular Retraction: 15 reps;Theraband Theraband Level (Scapular Retraction): Level 3 (Green) Row: 15 reps;Theraband Theraband Level (Row): Level 3 (Green) Shoulder Extension: 15 reps;Theraband Theraband Level (Shoulder Extension): Level 3 (Green) Supine Straight Leg Raise: 15 reps Large Ball Abdominal Isometric: 10 reps;5 seconds Large Ball Oblique Isometric: 10 reps;5 seconds Sidelying Clam: 15 reps Hip Abduction: 15 reps  Manual Therapy Other Manual Therapy: SCS to bilateral posterior hip musculature; MET completed to correct left out flare   Physical Therapy Assessment and Plan PT Assessment and Plan Clinical Impression Statement: Pt reports elevated pain this session. Pt completes therex with no change in pain. Strain counter strain completed to bilateral posterior hip musculature. This technique was only successful on right side. Muscle energy technique completed to correct left out flare as pt has pain with left internal hip rotation. Pt reports pain decrease to 2/10 at end of session. PT Plan: Reassess next session.     Problem List Patient Active Problem List   Diagnosis Date Noted  . Salmonella 05/24/2013  . Thrombocytopenia, unspecified 05/24/2013  . Acute gastroenteritis 05/21/2013  . Fever 05/21/2013  . Abnormal urinalysis 05/21/2013  . Hypokalemia 05/21/2013  . Dehydration 05/21/2013  . Coccydynia  05/07/2013  . Anemia 08/06/2012  . PUD (peptic ulcer disease) 02/06/2012  . OVERWEIGHT/OBESITY 12/21/2010  . HYPERTENSION 12/29/2009  . 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 GERD 12/29/2009  . HIATAL HERNIA 12/29/2009  . HEART MURMUR, SYSTOLIC 12/29/2009    PT - End of Session Activity Tolerance: Patient tolerated treatment well General Behavior During Therapy: [facility] for tasks assessed/performed Cognition: WFL for tasks performed  Seth Bake, PTA  08/06/2013, 3:59 PM

## 2013-08-08 ENCOUNTER — Ambulatory Visit (HOSPITAL_COMMUNITY)
Admission: RE | Admit: 2013-08-08 | Discharge: 2013-08-08 | Disposition: A | Discharge status: Home / Self Care | Payer: 59 | Source: Ambulatory Visit | Attending: Family Medicine | Admitting: Family Medicine

## 2013-08-08 NOTE — Evaluation (Signed)
Physical Therapy Discharge Summary  Patient Details  Name: [name] MRN: [id number] Date of Birth: 1946/11/29  Today's Date: 08/08/2013 Time: 2130-8657 PT Time Calculation (min): 26 min Charges: MMT x 1 Self-care x 18'              Visit#: 8 of 8  Re-eval: 08/14/13 Assessment Diagnosis: Coccydyina Next MD Visit: Dr. 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 Pain Assessment Currently in Pain?: Yes Pain Score: 3  Pain Location: Back Pain Orientation: Lower   Sensation/Coordination/Flexibility/Functional Tests Functional Tests Functional Tests: Oswestry 10/50 (Was 18/50 on 07/15/13)  Assessment RLE Strength Right Hip Flexion: 5/5 (was 5/5 07/15/13) Right Hip Extension: 5/5 (was 5/5 07/15/13) Right Hip ABduction: 5/5 (was 5/5 07/15/13) Right Hip ADduction: 5/5 (was 4/5 07/15/13) Right Knee Flexion: 5/5 (was 5/5 07/15/13) Right Knee Extension: 5/5 (was 5/5 07/15/13) Right Ankle Dorsiflexion: 5/5 (was 5/5 07/15/13)  LLE Strength Left Hip Flexion: 5/5 (was 5/5 07/15/13) Left Hip Extension: 5/5 (was 5/5 07/15/13) Left Hip ABduction: 5/5 (was 5/5 07/15/13) Left Knee Flexion: 5/5 (was 4/5 07/15/13) Left Knee Extension: 5/5 (was 5/5 07/15/13) Left Ankle Dorsiflexion: 5/5 (was 5/5 07/15/13)  Lumbar AROM all remain WNL  Palpation: No tenderness in coccyx area  Exercise/Treatments Mobility/Balance  Posture/Postural Control Postural Limitations: SI is in allgnment   Physical Therapy Assessment and Plan PT Assessment and Plan Clinical Impression Statement: Pt has progressed well with therapy. All goals have been met. Sacroiliac joint is in alignment. Strength and ROM are WNL. Oswestry improved 16%. Pt is independent with HEP and comfortable with D/C. PT Plan: Recommend D/C to HEP.    Goals Home Exercise Program Pt/caregiver will Perform Home Exercise Program: For increased  strengthening PT Short Term Goals Time to Complete Short Term Goals: 2 weeks PT Short Term Goal 1: Pt will report pain less than 3/10 when sitting in her church pew.  PT Short Term Goal 1 - Progress: Met PT Short Term Goal 2: Pt will improve core coordination in order to present with normalized SI and coccyx alignment to allow the above to occur PT Short Term Goal 2 - Progress: Met PT Short Term Goal 3: Pt to be able to verbalize the importance in proper sitting posture to reduce pain PT Short Term Goal 3 - Progress: Met PT Long Term Goals Time to Complete Long Term Goals: 4 weeks PT Long Term Goal 1: Pt will improve core coordination and strengthe to [facility] in order to tolerate sitting for greater than an hour for improved posture to decrease risk of increased pain to coccyx. PT Long Term Goal 1 - Progress: Met PT Long Term Goal 2: Pt will report pain less than 2/10 while sitting on her couch.  PT Long Term Goal 2 - Progress: Met  Problem List Patient Active Problem List   Diagnosis Date Noted  . Salmonella 05/24/2013  . Thrombocytopenia, unspecified 05/24/2013  . Acute gastroenteritis 05/21/2013  . Fever 05/21/2013  . Abnormal urinalysis 05/21/2013  . Hypokalemia 05/21/2013  . Dehydration 05/21/2013  . Coccydynia 05/07/2013  . Anemia 08/06/2012  . PUD (peptic ulcer disease) 02/06/2012  . OVERWEIGHT/OBESITY 12/21/2010  . HYPERTENSION 12/29/2009  . 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 GERD 12/29/2009  . HIATAL HERNIA 12/29/2009  . HEART MURMUR, SYSTOLIC 12/29/2009    PT - End of Session Activity Tolerance: Patient tolerated treatment well General Behavior During Therapy: [facility] for tasks assessed/performed  Cognition: WFL for tasks performed  Seth Bake, PTA 08/08/2013, 1:42 PM  Physician Documentation Your signature is required to indicate approval of the treatment plan as stated above.  Please sign and either send electronically or make a copy of this report for your files and  return this physician signed original.   Please mark one 1.__approve of plan  2. ___approve of plan with the following conditions.   ______________________________                                                          _____________________ Physician Signature                                                                                                             Date

## 2013-08-12 ENCOUNTER — Ambulatory Visit (INDEPENDENT_AMBULATORY_CARE_PROVIDER_SITE_OTHER): Payer: 59 | Admitting: Internal Medicine

## 2013-08-12 ENCOUNTER — Encounter (INDEPENDENT_AMBULATORY_CARE_PROVIDER_SITE_OTHER): Payer: Self-pay | Admitting: Internal Medicine

## 2013-08-12 ENCOUNTER — Telehealth (INDEPENDENT_AMBULATORY_CARE_PROVIDER_SITE_OTHER): Payer: Self-pay | Admitting: *Deleted

## 2013-08-12 ENCOUNTER — Other Ambulatory Visit (INDEPENDENT_AMBULATORY_CARE_PROVIDER_SITE_OTHER): Payer: Self-pay | Admitting: *Deleted

## 2013-08-12 VITALS — BP 118/50 | HR 72 | Ht 64.0 in | Wt 199.7 lb

## 2013-08-12 DIAGNOSIS — Z1211 Encounter for screening for malignant neoplasm of colon: Secondary | ICD-10-CM

## 2013-08-12 DIAGNOSIS — K279 Peptic ulcer, site unspecified, unspecified as acute or chronic, without hemorrhage or perforation: Secondary | ICD-10-CM

## 2013-08-12 DIAGNOSIS — K219 Gastro-esophageal reflux disease without esophagitis: Secondary | ICD-10-CM

## 2013-08-12 NOTE — Patient Instructions (Addendum)
OV in 1 yr. Be careful the NSAIDs.
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
## 2013-08-12 NOTE — Telephone Encounter (Signed)
Patient needs movi prep 

## 2013-08-13 ENCOUNTER — Ambulatory Visit (HOSPITAL_COMMUNITY): Payer: 59 | Admitting: *Deleted

## 2013-08-15 ENCOUNTER — Ambulatory Visit (HOSPITAL_COMMUNITY): Payer: 59 | Admitting: *Deleted

## 2013-08-15 MED ORDER — PEG-KCL-NACL-NASULF-NA ASC-C 100 G PO SOLR: 1.0000 | Freq: Once | ORAL | Status: DC

## 2013-08-16 ENCOUNTER — Other Ambulatory Visit (INDEPENDENT_AMBULATORY_CARE_PROVIDER_SITE_OTHER): Payer: Self-pay | Admitting: Internal Medicine

## 2013-08-22 ENCOUNTER — Other Ambulatory Visit (HOSPITAL_COMMUNITY): Payer: Self-pay | Admitting: Physical Medicine and Rehabilitation

## 2013-08-22 DIAGNOSIS — M549 Dorsalgia, unspecified: Secondary | ICD-10-CM

## 2013-08-22 DIAGNOSIS — M79605 Pain in left leg: Secondary | ICD-10-CM

## 2013-08-22 DIAGNOSIS — M5418 Radiculopathy, sacral and sacrococcygeal region: Secondary | ICD-10-CM

## 2013-08-23 ENCOUNTER — Ambulatory Visit (HOSPITAL_COMMUNITY)
Admission: RE | Admit: 2013-08-23 | Discharge: 2013-08-23 | Disposition: A | Discharge status: Home / Self Care | Payer: 59 | Source: Ambulatory Visit | Attending: Physical Medicine and Rehabilitation | Admitting: Physical Medicine and Rehabilitation

## 2013-08-23 DIAGNOSIS — M549 Dorsalgia, unspecified: Secondary | ICD-10-CM

## 2013-08-23 DIAGNOSIS — M545 Low back pain, unspecified: Secondary | ICD-10-CM | POA: Insufficient documentation

## 2013-08-23 DIAGNOSIS — M129 Arthropathy, unspecified: Secondary | ICD-10-CM | POA: Insufficient documentation

## 2013-08-23 DIAGNOSIS — M5418 Radiculopathy, sacral and sacrococcygeal region: Secondary | ICD-10-CM

## 2013-08-23 DIAGNOSIS — M79605 Pain in left leg: Secondary | ICD-10-CM

## 2013-08-27 ENCOUNTER — Encounter (HOSPITAL_COMMUNITY): Payer: Self-pay | Admitting: Pharmacy Technician

## 2013-08-27 DIAGNOSIS — Z23 Encounter for immunization: Secondary | ICD-10-CM | POA: Diagnosis not present

## 2013-09-11 DIAGNOSIS — M533 Sacrococcygeal disorders, not elsewhere classified: Secondary | ICD-10-CM | POA: Diagnosis not present

## 2013-09-13 DIAGNOSIS — M533 Sacrococcygeal disorders, not elsewhere classified: Secondary | ICD-10-CM | POA: Diagnosis not present

## 2013-09-17 ENCOUNTER — Ambulatory Visit (HOSPITAL_COMMUNITY): Payer: 59 | Admitting: Physical Therapy

## 2013-09-18 ENCOUNTER — Ambulatory Visit (HOSPITAL_COMMUNITY)
Admission: RE | Admit: 2013-09-18 | Discharge: 2013-09-18 | Disposition: A | Discharge status: Home / Self Care | Payer: Medicare Other | Source: Ambulatory Visit | Attending: Orthopedic Surgery | Admitting: Orthopedic Surgery

## 2013-09-18 DIAGNOSIS — M25569 Pain in unspecified knee: Secondary | ICD-10-CM | POA: Insufficient documentation

## 2013-09-18 DIAGNOSIS — R29898 Other symptoms and signs involving the musculoskeletal system: Secondary | ICD-10-CM | POA: Diagnosis not present

## 2013-09-18 DIAGNOSIS — M6281 Muscle weakness (generalized): Secondary | ICD-10-CM | POA: Diagnosis not present

## 2013-09-18 DIAGNOSIS — M25562 Pain in left knee: Secondary | ICD-10-CM | POA: Insufficient documentation

## 2013-09-18 DIAGNOSIS — IMO0001 Reserved for inherently not codable concepts without codable children: Secondary | ICD-10-CM | POA: Diagnosis not present

## 2013-09-18 DIAGNOSIS — I10 Essential (primary) hypertension: Secondary | ICD-10-CM | POA: Diagnosis not present
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
## 2013-09-26 ENCOUNTER — Ambulatory Visit (HOSPITAL_COMMUNITY)
Admission: RE | Admit: 2013-09-26 | Discharge: 2013-09-26 | Disposition: A | Discharge status: Home / Self Care | Payer: Medicare Other | Source: Ambulatory Visit | Attending: Internal Medicine | Admitting: Internal Medicine

## 2013-09-26 ENCOUNTER — Encounter (HOSPITAL_COMMUNITY)
Admission: RE | Disposition: A | Discharge status: Home / Self Care | Payer: Self-pay | Source: Ambulatory Visit | Attending: Internal Medicine

## 2013-09-26 ENCOUNTER — Encounter (HOSPITAL_COMMUNITY): Payer: Self-pay | Admitting: *Deleted

## 2013-09-26 DIAGNOSIS — Q438 Other specified congenital malformations of intestine: Secondary | ICD-10-CM

## 2013-09-26 DIAGNOSIS — Z1211 Encounter for screening for malignant neoplasm of colon: Secondary | ICD-10-CM

## 2013-09-26 HISTORY — PX: COLONOSCOPY: SHX5424

## 2013-09-26 SURGERY — COLONOSCOPY
Anesthesia: Moderate Sedation

## 2013-09-26 MED ORDER — MEPERIDINE HCL 50 MG/ML IJ SOLN
INTRAMUSCULAR | Status: AC
  Filled 2013-09-26: qty 1

## 2013-09-26 MED ORDER — MEPERIDINE HCL 50 MG/ML IJ SOLN
INTRAMUSCULAR | Status: DC | PRN
  Administered 2013-09-26 (×4): 25 mg via INTRAVENOUS

## 2013-09-26 MED ORDER — MIDAZOLAM HCL 5 MG/5ML IJ SOLN
INTRAMUSCULAR | Status: AC
  Filled 2013-09-26: qty 5

## 2013-09-26 MED ORDER — MIDAZOLAM HCL 5 MG/5ML IJ SOLN
INTRAMUSCULAR | Status: AC
  Filled 2013-09-26: qty 10

## 2013-09-26 MED ORDER — SODIUM CHLORIDE 0.9 % IV SOLN
INTRAVENOUS | Status: DC
  Administered 2013-09-26: 1000 mL via INTRAVENOUS

## 2013-09-26 MED ORDER — PROMETHAZINE HCL 25 MG/ML IJ SOLN
INTRAMUSCULAR | Status: DC | PRN
  Administered 2013-09-26: 12.5 mg via INTRAVENOUS

## 2013-09-26 MED ORDER — STERILE WATER FOR IRRIGATION IR SOLN
Status: DC | PRN
  Administered 2013-09-26: 11:00:00

## 2013-09-26 MED ORDER — MIDAZOLAM HCL 5 MG/5ML IJ SOLN
INTRAMUSCULAR | Status: DC | PRN
  Administered 2013-09-26: 2 mg via INTRAVENOUS
  Administered 2013-09-26: 3 mg via INTRAVENOUS
  Administered 2013-09-26 (×5): 2 mg via INTRAVENOUS

## 2013-09-26 MED ORDER — SODIUM CHLORIDE 0.9 % IJ SOLN
INTRAMUSCULAR | Status: AC
  Filled 2013-09-26: qty 10

## 2013-09-26 MED ORDER — PROMETHAZINE HCL 25 MG/ML IJ SOLN
INTRAMUSCULAR | Status: AC
  Filled 2013-09-26: qty 1
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
## 2013-09-26 NOTE — Op Note (Signed)
COLONOSCOPY PROCEDURE REPORT  PATIENT:  [name]  MR#:  [id number] Birthdate:  February 09, 1946, 67 y.o., female Endoscopist:  Dr. Malissa Hippo, MD Referred By:  Dr. Colette Ribas, MD  Procedure Date: 09/26/2013  Procedure:   Colonoscopy  Indications:  Patient is 67 year old Caucasian female who is undergoing average risk screening colonoscopy.  Informed Consent:  The procedure and risks were reviewed with the patient and informed consent was obtained.  Medications:  Demerol 100 mg IV Versed 15 mg IV Promethazine 12.5 mg IV and diluted form.  Description of procedure:  After a digital rectal exam was performed, that colonoscope was advanced from the anus through the rectum and colon to the area of the cecum, ileocecal valve and appendiceal orifice. The cecum was deeply intubated. These structures were well-seen and photographed for the record. From the level of the cecum and ileocecal valve, the scope was slowly and cautiously withdrawn. The mucosal surfaces were carefully surveyed utilizing scope tip to flexion to facilitate fold flattening as needed. The scope was pulled down into the rectum where a thorough exam including retroflexion was performed. Procedure was begun with pediatric colonoscope and completed with Slim colonoscope.  Findings:   Prep excellent. Very tortuous colon. Slims cold used to complete exam. Normal mucosa throughout. Normal mucosa of rectum and anorectal junction except focal thickening of anoderm.    Therapeutic/Diagnostic Maneuvers Performed:  None  Complications:  None  Cecal Withdrawal Time:  7 minutes  Impression:  Normal colonoscopy except tortuous colon.  Recommendations:  Standard instructions given. Consider propofol and slim scope for next exam in 10 years. Marland Kitchen   [name] U  09/26/2013 12:45 PM  CC: Dr. Phillips Odor, Chancy Hurter, MD & Dr. Bonnetta Barry ref. provider found

## 2013-09-30 ENCOUNTER — Encounter (HOSPITAL_COMMUNITY): Payer: Self-pay | Admitting: Internal Medicine

## 2013-09-30 ENCOUNTER — Ambulatory Visit (HOSPITAL_COMMUNITY)
Admission: RE | Admit: 2013-09-30 | Discharge: 2013-09-30 | Disposition: A | Discharge status: Home / Self Care | Payer: Medicare Other | Source: Ambulatory Visit | Attending: Family Medicine | Admitting: Family Medicine

## 2013-09-30 DIAGNOSIS — M533 Sacrococcygeal disorders, not elsewhere classified: Secondary | ICD-10-CM | POA: Insufficient documentation

## 2013-09-30 DIAGNOSIS — IMO0001 Reserved for inherently not codable concepts without codable children: Secondary | ICD-10-CM | POA: Diagnosis not present

## 2013-09-30 NOTE — Progress Notes (Signed)
Physical Therapy Treatment Patient Details  Name: [name] MRN: [id number] Date of Birth: Sep 02, 1946  Today's Date: 09/30/2013 Time: 4098-1191 PT Time Calculation (min): 42 min Visit#: 2 of 8  Re-eval: 10/18/13 Charges: therex 32', manual 8'  Subjective: Symptoms/Limitations Symptoms: Pt states her knee is still hurting.  Pain 4/10 today in Lt knee.  Pt reports she rides her bike and ices her knee at home.   Pain Assessment Currently in Pain?: Yes Pain Score: 4  Pain Location: Knee Pain Orientation: Left   Exercise/Treatments Stretches Passive Hamstring Stretch: 2 reps;30 seconds Quad Stretch: 2 reps;30 seconds ITB Stretch: 2 reps;30 seconds;Limitations ITB Stretch Limitations: supine with rope Aerobic Stationary Bike: NuStep 8' seat 10 LE only, hills #3, level 3 Standing Heel Raises: 10 reps;Limitations Heel Raises Limitations: toeraises 10 reps Lateral Step Up: 10 reps;Step Height: 4";Hand Hold: 1;Left Forward Step Up: 10 reps;Step Height: 4";Hand Hold: 1;Left Step Down: 10 reps;Step Height: 4";Left;Hand Hold: 1 Rocker Board: 2 minutes SLS: 5"Lt LE max of 5 trials Supine Straight Leg Raises: 10 reps;Left Sidelying Hip ABduction: 10 reps;Left Prone  Hamstring Curl: 10 reps Hip Extension: 10 reps;Left   Manual Therapy Manual Therapy: Other (comment) Other Manual Therapy: MFR to Lt lateral knee  Physical Therapy Assessment and Plan PT Assessment and Plan Clinical Impression Statement: Progressed therex per POC without difficulty.   Instructed with mat exercises pt can do at home.  Pt unable to SLS greater than 5" on Lt LE.  Pt encouraged to ice at home if needed. PT Plan: Add wallsquats and cybex for quadricep and hamstring.  Flexibility to quadriceps, hip flexors, gastroc, soleus, hamstrings and ITB.  Manual techniques and estim vs. [place] for pain control.  Progress to biodex strengthening activities for RLE when able.      Problem List Patient Active  Problem List   Diagnosis Date Noted  . Left knee pain 09/18/2013  . Salmonella 05/24/2013  . Thrombocytopenia, unspecified 05/24/2013  . Acute gastroenteritis 05/21/2013  . Fever 05/21/2013  . Abnormal urinalysis 05/21/2013  . Hypokalemia 05/21/2013  . Dehydration 05/21/2013  . Coccydynia 05/07/2013  . Anemia 08/06/2012  . PUD (peptic ulcer disease) 02/06/2012  . OVERWEIGHT/OBESITY 12/21/2010  . HYPERTENSION 12/29/2009  . 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 GERD 12/29/2009  . HIATAL HERNIA 12/29/2009  . HEART MURMUR, SYSTOLIC 12/29/2009    General Behavior During Therapy: [facility] for tasks assessed/performed   Lurena Nida, PTA/CLT 09/30/2013, 4:54 PM

## 2013-10-02 ENCOUNTER — Ambulatory Visit (HOSPITAL_COMMUNITY)
Admission: RE | Admit: 2013-10-02 | Discharge: 2013-10-02 | Disposition: A | Discharge status: Home / Self Care | Payer: Medicare Other | Source: Ambulatory Visit | Attending: Family Medicine | Admitting: Family Medicine

## 2013-10-02 NOTE — Progress Notes (Signed)
Physical Therapy Treatment Patient Details  Name: [name] MRN: [id number] Date of Birth: 1946-11-05  Today's Date: 10/02/2013 Time: 1600-1650 PT Time Calculation (min): 50 min Visit#: 3 of 8  Re-eval: 10/18/13 Charges:  therex  36' 1600-1636, manual 10' [phone]  Subjective:  Pt states it felt much better after last visit.  Currently without pain.     Exercise/Treatments Stretches Active Hamstring Stretch: 2 reps;30 seconds ITB Stretch: 2 reps;30 seconds;Limitations ITB Stretch Limitations: supine with rope Aerobic Stationary Bike: NuStep 8' seat 10 LE only, hills #3, level 3 Standing Heel Raises: 15 reps Heel Raises Limitations: toeraises 15 reps Lateral Step Up: 15 reps;Step Height: 4";Hand Hold: 1;Left Forward Step Up: 15 reps;Step Height: 4";Left;Hand Hold: 1 Step Down: 15 reps;Step Height: 4";Left;Hand Hold: 1 Wall Squat: 10 reps;5 seconds Rocker Board: 2 minutes SLS: 5"Lt LE max of 5 trials Supine Straight Leg Raises: 15 reps;Left;Limitations Straight Leg Raises Limitations: D/C to HEP Sidelying Hip ABduction: 15 reps;Limitations Hip ABduction Limitations: D/C to HEP Prone  Hamstring Curl: 15 reps;Limitations Hamstring Curl Limitations: d/c to HEP Hip Extension: 15 reps;Left;Limitations Hip Extension Limitations: d/c  to HEP   Manual Therapy Manual Therapy: Other (comment) Other Manual Therapy: MFR to Lt lateral knee  Physical Therapy Assessment and Plan PT Assessment and Plan Clinical Impression Statement: Able to complete all exercises without difficulty.  Increased reps without c/o discomfort. Overall improvment voiced with manual techniques.  continues to have difficulty with Lt LE balance. PT Plan: Add cybex for quadricep and hamstring stregthening, slant board stretch for gastroc and continue manual techniques to decrease adhesions and pain.  Progress stability exercises with SLS with vectors     Problem List Patient Active Problem List   Diagnosis Date Noted  . Left knee pain 09/18/2013  . Salmonella 05/24/2013  . Thrombocytopenia, unspecified 05/24/2013  . Acute gastroenteritis 05/21/2013  . Fever 05/21/2013  . Abnormal urinalysis 05/21/2013  . Hypokalemia 05/21/2013  . Dehydration 05/21/2013  . Coccydynia 05/07/2013  . Anemia 08/06/2012  . PUD (peptic ulcer disease) 02/06/2012  . OVERWEIGHT/OBESITY 12/21/2010  . HYPERTENSION 12/29/2009  . 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 GERD 12/29/2009  . HIATAL HERNIA 12/29/2009  . HEART MURMUR, SYSTOLIC 12/29/2009    PT - End of Session Activity Tolerance: Patient tolerated treatment well General Behavior During Therapy: [facility] for tasks assessed/performed Cognition: WFL for tasks performed   Lurena Nida, PTA/CLT 10/02/2013, 5:08 PM

## 2013-10-07 ENCOUNTER — Ambulatory Visit (HOSPITAL_COMMUNITY)
Admission: RE | Admit: 2013-10-07 | Discharge: 2013-10-07 | Disposition: A | Discharge status: Home / Self Care | Payer: Medicare Other | Source: Ambulatory Visit | Attending: Family Medicine | Admitting: Family Medicine

## 2013-10-07 DIAGNOSIS — M25562 Pain in left knee: Secondary | ICD-10-CM

## 2013-10-07 NOTE — Progress Notes (Signed)
Physical Therapy Treatment Patient Details  Name: [name] MRN: [id number] Date of Birth: 08-25-1946  Today's Date: 10/07/2013 Time: 1324-4010 PT Time Calculation (min): 60 min Charge: TE 1518-1600, Manual  2725-3664  Visit#: 4 of 8  Re-eval: 10/18/13 Assessment Diagnosis: Lt knee pain Next MD Visit: Dr. Renaye Rakers ([place]) November  Subjective: Symptoms/Limitations Symptoms: Pt stated Lt knee is in a little pain today, pain scale 3/10.  Reports compliance with HEP and has been riding her bike. Pain Assessment Currently in Pain?: Yes Pain Score: 3  Pain Location: Knee Pain Orientation: Left  Objective:  Exercise/Treatments Stretches Gastroc Stretch: 3 reps;30 seconds;Limitations Gastroc Stretch Limitations: slant board Aerobic Stationary Bike: NuStep 8' seat 10 LE only, hills #3, level 3 Machines for Strengthening Cybex Knee Extension: 3Pl 15x Bil LE Cybex Knee Flexion: 7Pl 15x Bil LE Standing Heel Raises: 20 reps Heel Raises Limitations: toeraises 20 reps Forward Step Up: Left;15 reps;Hand Hold: 0;Step Height: 6" Step Down: Left;15 reps;Hand Hold: 1;Step Height: 6" Functional Squat: 10 reps Wall Squat: 10 reps;5 seconds Rocker Board: 2 minutes SLS: 5"Lt LE max of 5 trials   Manual Therapy Manual Therapy: Myofascial release Joint Mobilization: patella mobs all directions and tib/fib mobs for pain control Myofascial Release: MFR to Lt lateral knee  Physical Therapy Assessment and Plan PT Assessment and Plan Clinical Impression Statement: Pt progressing well, able to increase step height with stair training with proper technique  and added cybex quad and hamstring machines for LE strengthening without difficulty.  Continued maual techniques lateral knee to reduce fascial restrictions with pain reduced to 1/10 at end of session.  Pt plans to apply ice at needed at home.   PT Plan: Continue strengthening and stretches and manual techniques to decrease  adhesions and pain.  Progress stability exercises with SLS with vectors    Goals Home Exercise Program Pt/caregiver will Perform Home Exercise Program: Independently PT Short Term Goals Time to Complete Short Term Goals: 3 weeks PT Short Term Goal 1: Pt will report Lt knee pain less than 3/10 during the evening hours for improved QOL.  PT Short Term Goal 1 - Progress: Progressing toward goal PT Short Term Goal 2: Pt will improve her body awareness and demonstrate even distrubution to RLE and LLE to decreae Lt knee pain.  PT Short Term Goal 2 - Progress: Progressing toward goal PT Long Term Goals Time to Complete Long Term Goals:  (6 weeks) PT Long Term Goal 1: Pt will improve Lt knee prone knee flexion to 120 degrees without increased quadricep pain and Lt hip flexion with straight leg to 80 degrees for greater ease with going from sit to stand from a car.  PT Long Term Goal 1 - Progress: Progressing toward goal PT Long Term Goal 2: Pt will improve her RLE 1 rep max to withing 10% of LLE 1 rep max for knee extension for greater ease with ascending and desecending 10 stairs with 1 handrail, to decreae load on LLE.  PT Long Term Goal 2 - Progress: Progressing toward goal Long Term Goal 3: Pt will improve FOTO score to Status > 54% and Limiation < 46%  Problem List Patient Active Problem List   Diagnosis Date Noted  . Left knee pain 09/18/2013  . Salmonella 05/24/2013  . Thrombocytopenia, unspecified 05/24/2013  . Acute gastroenteritis 05/21/2013  . Fever 05/21/2013  . Abnormal urinalysis 05/21/2013  . Hypokalemia 05/21/2013  . Dehydration 05/21/2013  . Coccydynia 05/07/2013  . Anemia 08/06/2012  .  PUD (peptic ulcer disease) 02/06/2012  . OVERWEIGHT/OBESITY 12/21/2010  . HYPERTENSION 12/29/2009  . 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 GERD 12/29/2009  . HIATAL HERNIA 12/29/2009  . HEART MURMUR, SYSTOLIC 12/29/2009    PT - End of Session Activity Tolerance: Patient tolerated treatment  well General Behavior During Therapy: WFL for tasks assessed/performed Cognition: WFL for tasks performed  GP    Juel Burrow 10/07/2013, 4:27 PM

## 2013-10-08 NOTE — Addendum Note (Signed)
Encounter addended by: Matilde Haymaker, PT on: 10/08/2013  9:04 AM<BR>     Documentation filed: Flowsheet VN, Clinical Notes

## 2013-10-09 ENCOUNTER — Ambulatory Visit (HOSPITAL_COMMUNITY)
Admission: RE | Admit: 2013-10-09 | Discharge: 2013-10-09 | Disposition: A | Discharge status: Home / Self Care | Payer: Medicare Other | Source: Ambulatory Visit | Attending: Family Medicine | Admitting: Family Medicine

## 2013-10-09 DIAGNOSIS — M25562 Pain in left knee: Secondary | ICD-10-CM

## 2013-10-09 NOTE — Progress Notes (Signed)
Physical Therapy Treatment Patient Details  Name: [name] MRN: [id number] Date of Birth: 1946-10-07  Today's Date: 10/09/2013 Time: 0272-5366 PT Time Calculation (min): 53 min Charge: TE 30' 1522-1552, NMR [phone], [place] 8' 1607-1615  Visit#: 5 of 8  Re-eval: 10/18/13 Assessment Diagnosis: Lt knee pain Next MD Visit: Dr. Renaye Rakers ([place]) November  Authorization: [facility]  Authorization Time Period:    Authorization Visit#:   of     Subjective: Symptoms/Limitations Symptoms: Pt reported she was sore today anterior proximal knee, pain scale 2/10 today.   Pain Assessment Currently in Pain?: Yes Pain Score: 2  Pain Location: Knee Pain Orientation: Left;Anterior;Proximal  Objective:  Exercise/Treatments Stretches Gastroc Stretch: 3 reps;30 seconds;Limitations Theme park manager Limitations: slant board Aerobic Stationary Bike: NuStep 10' seat 10 LE only, hills #3, level 3, SPM 100 Machines for Strengthening Cybex Knee Extension: 3Pl 15x Bil LE Cybex Knee Flexion: 5 PL 15x Lt LE only Standing Heel Raises: 20 reps Heel Raises Limitations: toeraises 20 reps Lateral Step Up: 15 reps;Step Height: 4";Hand Hold: 1;Left Forward Step Up: Left;15 reps;Hand Hold: 0;Step Height: 6" Step Down: Left;15 reps;Step Height: 6";Hand Hold: 0 Wall Squat: 10 reps;10 seconds Rocker Board: 2 minutes SLS: Lt LE 6", Rt 6" max of 5 Other Standing Knee Exercises: Tandem stance 1x 30" Lt behind, tandem stance 4in step 1x 30"; Tandem gait 1RT   Modalities Modalities: Ultrasound Manual Therapy Manual Therapy: Joint mobilization Joint Mobilization: patella mobs all directions and tib/fib mobs for pain control Ultrasound Ultrasound Location: anterior/lateral Lt LE with LE elevated Ultrasound Parameters: 3 MHz 0.8 w/cm2 pulsed  Ultrasound Goals: Pain  Physical Therapy Assessment and Plan PT Assessment and Plan Clinical Impression Statement: Session focus on balance training to  ireduce risk of falls.  Pt with improved balance following cueing to focus and improve spatial awareness.  Progressed strengthening exercises with Lt LE only with hamstrings cybex machinces and increased difficulty on Nustep.  End of session began pulsed ultrasound with pain relief stated following new modality. PT Plan: F/U with [place].  Continue strengthening and stretches and manual techniques to decrease adhesions and pain.  Progress stability exercises with SLS with vectors    Goals Home Exercise Program Pt/caregiver will Perform Home Exercise Program: Independently PT Short Term Goals Time to Complete Short Term Goals: 3 weeks PT Short Term Goal 1: Pt will report Lt knee pain less than 3/10 during the evening hours for improved QOL.  PT Short Term Goal 1 - Progress: Progressing toward goal PT Short Term Goal 2: Pt will improve her body awareness and demonstrate even distrubution to RLE and LLE to decreae Lt knee pain.  PT Short Term Goal 2 - Progress: Progressing toward goal PT Long Term Goals Time to Complete Long Term Goals:  (6 weeks) PT Long Term Goal 1: Pt will improve Lt knee prone knee flexion to 120 degrees without increased quadricep pain and Lt hip flexion with straight leg to 80 degrees for greater ease with going from sit to stand from a car.  PT Long Term Goal 1 - Progress: Progressing toward goal PT Long Term Goal 2: Pt will improve her RLE 1 rep max to withing 10% of LLE 1 rep max for knee extension for greater ease with ascending and desecending 10 stairs with 1 handrail, to decreae load on LLE.  PT Long Term Goal 2 - Progress: Progressing toward goal Long Term Goal 3: Pt will improve FOTO score to Status > 54% and Limiation < 46%  Problem List Patient Active Problem List   Diagnosis Date Noted  . Left knee pain 09/18/2013  . Salmonella 05/24/2013  . Thrombocytopenia, unspecified 05/24/2013  . Acute gastroenteritis 05/21/2013  . Fever 05/21/2013  . Abnormal urinalysis  05/21/2013  . Hypokalemia 05/21/2013  . Dehydration 05/21/2013  . Coccydynia 05/07/2013  . Anemia 08/06/2012  . PUD (peptic ulcer disease) 02/06/2012  . OVERWEIGHT/OBESITY 12/21/2010  . HYPERTENSION 12/29/2009  . 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 GERD 12/29/2009  . HIATAL HERNIA 12/29/2009  . HEART MURMUR, SYSTOLIC 12/29/2009    PT - End of Session Activity Tolerance: Patient tolerated treatment well General Behavior During Therapy: WFL for tasks assessed/performed Cognition: WFL for tasks performed  GP    Juel Burrow 10/09/2013, 4:32 PM

## 2013-10-14 ENCOUNTER — Ambulatory Visit (HOSPITAL_COMMUNITY)
Admission: RE | Admit: 2013-10-14 | Discharge: 2013-10-14 | Disposition: A | Discharge status: Home / Self Care | Payer: Medicare Other | Source: Ambulatory Visit | Attending: Family Medicine | Admitting: Family Medicine

## 2013-10-14 DIAGNOSIS — M25562 Pain in left knee: Secondary | ICD-10-CM

## 2013-10-14 NOTE — Progress Notes (Signed)
Physical Therapy Treatment Patient Details  Name: [name] MRN: [id number] Date of Birth: 1946-02-06  Today's Date: 10/14/2013 Time: 1513-1600 PT Time Calculation (min): 47 min Charge: TE 1513-1530, NMR 7253-6644, [place] 0347-4259, Manual 1550-1600  Visit#: 6 of 8  Re-eval: 10/18/13 Assessment Diagnosis: Lt knee pain Next MD Visit: Dr. Renaye Rakers ([place]) November  Authorization: [facility]  Authorization Time Period:    Authorization Visit#:   of     Subjective: Symptoms/Limitations Symptoms: Pt reported pain relief following [place] for 24 to 48 hours, stated pain increased today with pain scale 5/10 Pain Assessment Currently in Pain?: Yes Pain Score: 5  Pain Location: Knee Pain Orientation: Left;Anterior  Exercise/Treatments Stretches Gastroc Stretch: 3 reps;30 seconds;Limitations Gastroc Stretch Limitations: slant board Aerobic Stationary Bike: NuStep 10' seat 10 LE only, hills #3, level 3, SPM 100 Machines for Strengthening Cybex Knee Extension: 3Pl 15x Lt LE Cybex Knee Flexion: 5 PL 15x Lt LE only Standing SLS: Lt 15", Rt 16" max of 3 SLS with Vectors: 3x 5" with 1 HHA Other Standing Knee Exercises: Tandem and retro tandem gait 1 RT   Modalities Modalities: Ultrasound Manual Therapy Manual Therapy: Myofascial release Joint Mobilization: patella mobs all directions and tib/fib mobs for pain control. Myofascial Release: MFR to Lt lateral knee and fibular head Ultrasound Ultrasound Location: anterior/lateral Lt LE with LE elevated Ultrasound Parameters: 3 MHz 0.8 w/cm2 pulsed Ultrasound Goals: Pain  Physical Therapy Assessment and Plan PT Assessment and Plan Clinical Impression Statement: Pt improving balance, improved SLS and increase ease with tandem stances and gait this session. Added vector stance with good form with ong finger HHA.  Continued with [place] to lateral knee for pain control and added manual to assist.  Noted fascial restrictions proximal fibular  head with pain in this region, MFR complete to reduce fascial restrictions. PT Plan: F/U with [place].  Continue strengthening and stretches and manual techniques to decrease adhesions and pain.  Progress stability exercises with SLS with vectors    Goals Home Exercise Program Pt/caregiver will Perform Home Exercise Program: Independently PT Short Term Goals Time to Complete Short Term Goals: 3 weeks PT Short Term Goal 1: Pt will report Lt knee pain less than 3/10 during the evening hours for improved QOL.  PT Short Term Goal 1 - Progress: Progressing toward goal PT Short Term Goal 2: Pt will improve her body awareness and demonstrate even distrubution to RLE and LLE to decreae Lt knee pain.  PT Short Term Goal 2 - Progress: Progressing toward goal PT Long Term Goals Time to Complete Long Term Goals:  (6 weeks) PT Long Term Goal 1: Pt will improve Lt knee prone knee flexion to 120 degrees without increased quadricep pain and Lt hip flexion with straight leg to 80 degrees for greater ease with going from sit to stand from a car.  PT Long Term Goal 2: Pt will improve her RLE 1 rep max to withing 10% of LLE 1 rep max for knee extension for greater ease with ascending and desecending 10 stairs with 1 handrail, to decreae load on LLE.  Long Term Goal 3: Pt will improve FOTO score to Status > 54% and Limiation < 46%  Problem List Patient Active Problem List   Diagnosis Date Noted  . Left knee pain 09/18/2013  . Salmonella 05/24/2013  . Thrombocytopenia, unspecified 05/24/2013  . Acute gastroenteritis 05/21/2013  . Fever 05/21/2013  . Abnormal urinalysis 05/21/2013  . Hypokalemia 05/21/2013  . Dehydration 05/21/2013  . Coccydynia  05/07/2013  . Anemia 08/06/2012  . PUD (peptic ulcer disease) 02/06/2012  . OVERWEIGHT/OBESITY 12/21/2010  . HYPERTENSION 12/29/2009  . 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 GERD 12/29/2009  . HIATAL HERNIA 12/29/2009  . HEART MURMUR, SYSTOLIC 12/29/2009    PT - End of  Session Activity Tolerance: Patient tolerated treatment well General Behavior During Therapy: WFL for tasks assessed/performed Cognition: WFL for tasks performed  GP    Juel Burrow 10/14/2013, 4:42 PM

## 2013-10-16 ENCOUNTER — Ambulatory Visit (HOSPITAL_COMMUNITY)
Admission: RE | Admit: 2013-10-16 | Discharge: 2013-10-16 | Disposition: A | Discharge status: Home / Self Care | Payer: Medicare Other | Source: Ambulatory Visit | Attending: Family Medicine | Admitting: Family Medicine

## 2013-10-16 DIAGNOSIS — M25562 Pain in left knee: Secondary | ICD-10-CM

## 2013-10-16 NOTE — Progress Notes (Signed)
Physical Therapy Treatment Patient Details  Name: [name] MRN: [id number] Date of Birth: [birth year]  Today's Date: 10/16/2013 Time: 1191-4782 PT Time Calculation (min): 60 min Charge: TE 9562-1308, Manual 1534-1553, IFES with ice x 15 1558-1613  Visit#: 7 of 8  Re-eval: 10/18/13 Assessment Diagnosis: Lt knee pain Next MD Visit: Dr. Renaye Rakers ([place]) November  Authorization: [facility]  Authorization Time Period:    Authorization Visit#:   of     Subjective: Symptoms/Limitations Symptoms: Pt stated a little relief for 8 hours following [place] last session, reported pain scale 5/10 today.  Pt feels her strength and ROM are improving but no improvements with the pain. Pain Assessment Currently in Pain?: Yes Pain Score: 5  Pain Location: Knee Pain Orientation: Left;Anterior  Objective:   Exercise/Treatments Stretches Active Hamstring Stretch: 3 reps;30 seconds ITB Stretch: 3 reps;30 seconds;Limitations ITB Stretch Limitations: supine with rope Gastroc Stretch: 3 reps;30 seconds;Limitations Gastroc Stretch Limitations: Training and development officer for Strengthening Cybex Knee Extension: 3Pl 15x Lt LE Cybex Knee Flexion: 5 PL 15x Lt LE only Standing SLS: LT 9" max of 5 Supine Straight Leg Raise with External Rotation: 10 reps;Limitations Straight Leg Raise with External Rotation Limitations: ER for VMO strengthening Patellar Mobs: patella mobs all directions, tib/fib  Modalities Modalities: Electrical Stimulation;Cryotherapy Manual Therapy Joint Mobilization: patella mobs all directions and tib/fib mobs for pain control. Myofascial Release: MFR to Lt lateral knee and fibular head Cryotherapy Number Minutes Cryotherapy: 15 Minutes Cryotherapy Location: Knee Type of Cryotherapy: Ice pack Pharmacologist Location: interferential hi/lo sweep x 15' with LE elevated and ice Electrical Stimulation Action: reduce pain Electrical Stimulation  Parameters: interferential hi/lo sweep x 15' with LE elevated and ice Electrical Stimulation Goals: Pain  Physical Therapy Assessment and Plan PT Assessment and Plan Clinical Impression Statement: Session focus on manual technques to reduce fascial restrictions.  Resumed SLR with ER for VMO strengthening.  Changed modalities to interferential estim with ice and LE elevated for pain control.   PT Plan: Re-eval next session.  F/u on Estim.  Continue strengthening and stretches and manual techniques to decrease adhesions and pain.  Progress stability exercises with SLS with vectors    Goals Home Exercise Program Pt/caregiver will Perform Home Exercise Program: Independently PT Short Term Goals Time to Complete Short Term Goals: 3 weeks PT Short Term Goal 1: Pt will report Lt knee pain less than 3/10 during the evening hours for improved QOL.  PT Short Term Goal 1 - Progress: Progressing toward goal PT Short Term Goal 2: Pt will improve her body awareness and demonstrate even distrubution to RLE and LLE to decreae Lt knee pain.  PT Short Term Goal 2 - Progress: Progressing toward goal PT Long Term Goals Time to Complete Long Term Goals:  (6 weeks) PT Long Term Goal 1: Pt will improve Lt knee prone knee flexion to 120 degrees without increased quadricep pain and Lt hip flexion with straight leg to 80 degrees for greater ease with going from sit to stand from a car.  PT Long Term Goal 1 - Progress: Progressing toward goal PT Long Term Goal 2: Pt will improve her RLE 1 rep max to withing 10% of LLE 1 rep max for knee extension for greater ease with ascending and desecending 10 stairs with 1 handrail, to decreae load on LLE.  PT Long Term Goal 2 - Progress: Progressing toward goal Long Term Goal 3: Pt will improve FOTO score to Status > 54% and Limiation <  46%  Problem List Patient Active Problem List   Diagnosis Date Noted  . Left knee pain 09/18/2013  . Salmonella 05/24/2013  .  Thrombocytopenia, unspecified 05/24/2013  . Acute gastroenteritis 05/21/2013  . Fever 05/21/2013  . Abnormal urinalysis 05/21/2013  . Hypokalemia 05/21/2013  . Dehydration 05/21/2013  . Coccydynia 05/07/2013  . Anemia 08/06/2012  . PUD (peptic ulcer disease) 02/06/2012  . OVERWEIGHT/OBESITY 12/21/2010  . HYPERTENSION 12/29/2009  . 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 GERD 12/29/2009  . HIATAL HERNIA 12/29/2009  . HEART MURMUR, SYSTOLIC 12/29/2009    PT - End of Session Activity Tolerance: Patient tolerated treatment well General Behavior During Therapy: WFL for tasks assessed/performed Cognition: WFL for tasks performed  GP    Juel Burrow 10/16/2013, 4:29 PM

## 2013-10-21 ENCOUNTER — Ambulatory Visit (HOSPITAL_COMMUNITY)
Admission: RE | Admit: 2013-10-21 | Discharge: 2013-10-21 | Disposition: A | Discharge status: Home / Self Care | Payer: Medicare Other | Source: Ambulatory Visit | Attending: Family Medicine | Admitting: Family Medicine

## 2013-10-21 DIAGNOSIS — M25562 Pain in left knee: Secondary | ICD-10-CM

## 2013-10-21 NOTE — Progress Notes (Addendum)
Physical Therapy Re-Evaluation  Patient Details  Name: [name] MRN: [id number] Date of Birth: 12-22-1946  Today's Date: 10/21/2013 Time: 1520-1602 PT Time Calculation (min): 42 min Charges:  1ROM/MMT Self Care: 1530-1545 TE: 4098-1191             Visit#: 8 of 14  Re-eval: 11/12/13 Assessment Diagnosis: Lt knee pain Next MD Visit: Dr. Renaye Rakers ([facility]) November  Subjective Symptoms/Limitations Symptoms: Pt reports that she is having significant pain to her Lt knee.  Had initial relief with e-stim which did not last.  Pain Assessment Currently in Pain?: Yes Pain Score: 5  Pain Location: Knee  Cognition/Observation Observation/Other Assessments Observations: Patellar tracking medially  Sensation/Coordination/Flexibility/Functional Tests Functional Tests Functional Tests: FOTO: Status: 45%, Limitation 57%  RLE PROM (degrees) RLE Overall PROM Comments: prone knee flexion: 110 degrees with pain Right Hip Flexion: 62 RLE Strength RLE Overall Strength Comments: 1 rep max: extension: 18lbs; Flexion: 45lbs LLE PROM (degrees) LLE Overall PROM Comments: prone knee flexion: 105 degrees with pain Left Hip Flexion: 115 (was 110) LLE Strength LLE Overall Strength Comments: 1 rep max: extension: 70lbs (was 45lbs); Flexion: 70lbs (was 57.5 lbs on 5 seeting for knee flexion) Palpation Palpation: significant patellar crepitis to Bil patella.   Exercise/Treatments Aerobic Stationary Bike: NuStep 10' seat 10 LE only, hills #3, level 4, SPM 110 Supine Short Arc Quad Sets: Left;20 reps;Limitations Short Arc Quad Sets Limitations: ER w/4lbs Bridges: Both;15 reps Straight Leg Raise with External Rotation Limitations: Left; 20 reps Patellar Mobs: patella mobs all directions, tib/fib  Physical Therapy Assessment and Plan PT Assessment and Plan Clinical Impression Statement: Ms. Buccellato has attended 8 OP PT visits for Lt knee pain with the following findings: at this time  pt has improved her LLE strength, continues to be limited by her Rt knee pain. Overall her LLE has significant pain and tenderness with moderate fascial restrictions and muscle spasms to her Lt adductor muscules and ITB.  Notable atrophy to Lt VMO with medial patellar tracking.  Have added exercises to HEP to improve VMO and hamstring strength.  Will continue to 3 weeks.  PT Frequency: Min 2X/week PT Duration:  (3 weeks) PT Plan: Focus on manual to adductor muscles and ITB.  Patella mobs, ITB stretching, quad stretching, hamstring stretching.  Add lateral step dows, hip s/l rainbows.     Goals Home Exercise Program Pt/caregiver will Perform Home Exercise Program: Independently PT Goal: Perform Home Exercise Program - Progress: Met PT Short Term Goals Time to Complete Short Term Goals: 3 weeks PT Short Term Goal 1: Pt will report Lt knee pain less than 3/10 during the evening hours for improved QOL.  PT Short Term Goal 1 - Progress: Progressing toward goal PT Short Term Goal 2: Pt will improve her body awareness and demonstrate even distrubution to RLE and LLE to decreae Lt knee pain.  PT Short Term Goal 2 - Progress: Met PT Long Term Goals Time to Complete Long Term Goals:  (6 weeks) PT Long Term Goal 1: Pt will improve Lt knee prone knee flexion to 120 degrees without increased quadricep pain and Lt hip flexion with straight leg to 80 degrees for greater ease with going from sit to stand from a car.  PT Long Term Goal 1 - Progress: Progressing toward goal PT Long Term Goal 2: Pt will improve her RLE 1 rep max to withing 10% of LLE 1 rep max for knee extension for greater ease with ascending and desecending 10 stairs  with 1 handrail, to decreae load on LLE.  PT Long Term Goal 2 - Progress: Not met Long Term Goal 3: Pt will improve FOTO score to Status > 54% and Limiation < 46% Long Term Goal 3 Progress: Progressing toward goal  Problem List Patient Active Problem List   Diagnosis Date Noted   . Left knee pain 09/18/2013  . Salmonella 05/24/2013  . Thrombocytopenia, unspecified 05/24/2013  . Acute gastroenteritis 05/21/2013  . Fever 05/21/2013  . Abnormal urinalysis 05/21/2013  . Hypokalemia 05/21/2013  . Dehydration 05/21/2013  . Coccydynia 05/07/2013  . Anemia 08/06/2012  . PUD (peptic ulcer disease) 02/06/2012  . OVERWEIGHT/OBESITY 12/21/2010  . HYPERTENSION 12/29/2009  . 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 GERD 12/29/2009  . HIATAL HERNIA 12/29/2009  . HEART MURMUR, SYSTOLIC 12/29/2009    PT - End of Session Activity Tolerance: Patient tolerated treatment well General Behavior During Therapy: WFL for tasks assessed/performed Cognition: WFL for tasks performed PT Plan of Care PT Patient Instructions: importance of patellar mobilizations,  Consulted and Agree with Plan of Care: Patient  GP FOTO: 48/57  Mobility  Current: CK Goal: CJ  Khya Halls, MPT, ATC 10/21/2013, 4:23 PM  Physician Documentation Your signature is required to indicate approval of the treatment plan as stated above.  Please sign and either send electronically or make a copy of this report for your files and return this physician signed original.   Please mark one 1.__approve of plan  2. ___approve of plan with the following conditions.   ______________________________                                                          _____________________ Physician Signature                                                                                                             Date

## 2013-10-25 ENCOUNTER — Ambulatory Visit (HOSPITAL_COMMUNITY)
Admission: RE | Admit: 2013-10-25 | Discharge: 2013-10-25 | Disposition: A | Discharge status: Home / Self Care | Payer: Medicare Other | Source: Ambulatory Visit | Attending: Family Medicine | Admitting: Family Medicine

## 2013-10-25 DIAGNOSIS — M25562 Pain in left knee: Secondary | ICD-10-CM

## 2013-10-25 NOTE — Progress Notes (Signed)
Physical Therapy Treatment Patient Details  Name: ERIONA KINCHEN MRN: [id number] Date of Birth: [birth year]  Today's Date: 10/25/2013 Time: 4098-1191 PT Time Calculation (min): 62 min Charge: TE 4782-9562, 1440-1450, Manual 1415-1430  Visit#: 9 of 14  Re-eval: 11/12/13 Assessment Diagnosis: Lt knee pain Next MD Visit: Dr. Renaye Rakers ([place]) November  Authorization: [facility]  Authorization Time Period:    Authorization Visit#:   of     Subjective: Symptoms/Limitations Symptoms: Pt reports she is feeling better, has been completeing new exercises with ER.   Pain Assessment Currently in Pain?: Yes Pain Score: 4  Pain Location: Knee Pain Orientation: Left;Anterior  Objective:   Exercise/Treatments Stretches Active Hamstring Stretch: 3 reps;30 seconds ITB Stretch: 3 reps;30 seconds;Limitations ITB Stretch Limitations: supine with rope Aerobic Stationary Bike: NuStep 10' seat 10 LE only, hills #3, level 4, SPM 110 Standing Lateral Step Up: 15 reps;Step Height: 4";Hand Hold: 1;Left Supine Short Arc Quad Sets: Left;20 reps;Limitations Short Arc Quad Sets Limitations: ER w/ 4lbs Bridges: Both;20 reps Straight Leg Raise with External Rotation: Left;20 reps;Limitations Straight Leg Raise with External Rotation Limitations: 4# Patellar Mobs: patella mobs all directions, tib/fib Sidelying Hip ABduction: 15 reps;Limitations Hip ABduction Limitations: Rt S/L raindbow  Manual Therapy Manual Therapy: Myofascial release Joint Mobilization: patella mobs all directions and tib/fib mobs for pain control Myofascial Release: MFR to Lt Lateral knee, fibular head, ITband and adductor musculatre to reduce fasical restrictions and pain  Physical Therapy Assessment and Plan PT Assessment and Plan Clinical Impression Statement: Session focus on VMO strengthening and manual techniques to reduce fascial restrictions and spasms to Lt adductor musculature and ITband.  Pt reported pain  resolved at end of session.   PT Plan: Focus on manual to adductor muscles and ITB.  Patella mobs, ITB stretching, quad stretching, hamstring stretching.  Add lateral step dows, hip s/l rainbows.     Goals Home Exercise Program Pt/caregiver will Perform Home Exercise Program: Independently PT Short Term Goals Time to Complete Short Term Goals: 3 weeks PT Short Term Goal 1: Pt will report Lt knee pain less than 3/10 during the evening hours for improved QOL.  PT Short Term Goal 2: Pt will improve her body awareness and demonstrate even distrubution to RLE and LLE to decreae Lt knee pain.  PT Long Term Goals Time to Complete Long Term Goals:  (6 weeks) PT Long Term Goal 1: Pt will improve Lt knee prone knee flexion to 120 degrees without increased quadricep pain and Lt hip flexion with straight leg to 80 degrees for greater ease with going from sit to stand from a car.  PT Long Term Goal 2: Pt will improve her RLE 1 rep max to withing 10% of LLE 1 rep max for knee extension for greater ease with ascending and desecending 10 stairs with 1 handrail, to decreae load on LLE.  PT Long Term Goal 2 - Progress: Progressing toward goal Long Term Goal 3: Pt will improve FOTO score to Status > 54% and Limiation < 46%  Problem List Patient Active Problem List   Diagnosis Date Noted  . Left knee pain 09/18/2013  . Salmonella 05/24/2013  . Thrombocytopenia, unspecified 05/24/2013  . Acute gastroenteritis 05/21/2013  . Fever 05/21/2013  . Abnormal urinalysis 05/21/2013  . Hypokalemia 05/21/2013  . Dehydration 05/21/2013  . Coccydynia 05/07/2013  . Anemia 08/06/2012  . PUD (peptic ulcer disease) 02/06/2012  . OVERWEIGHT/OBESITY 12/21/2010  . HYPERTENSION 12/29/2009  . 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 GERD 12/29/2009  .  HIATAL HERNIA 12/29/2009  . HEART MURMUR, SYSTOLIC 12/29/2009    PT - End of Session Activity Tolerance: Patient tolerated treatment well General Behavior During Therapy: WFL for  tasks assessed/performed Cognition: WFL for tasks performed  GP    Juel Burrow 10/25/2013, 2:47 PM

## 2013-10-29 ENCOUNTER — Ambulatory Visit (HOSPITAL_COMMUNITY)
Admission: RE | Admit: 2013-10-29 | Discharge: 2013-10-29 | Disposition: A | Discharge status: Home / Self Care | Payer: Medicare Other | Source: Ambulatory Visit | Attending: Family Medicine | Admitting: Family Medicine

## 2013-10-29 DIAGNOSIS — M533 Sacrococcygeal disorders, not elsewhere classified: Secondary | ICD-10-CM | POA: Insufficient documentation

## 2013-10-29 DIAGNOSIS — M25562 Pain in left knee: Secondary | ICD-10-CM

## 2013-10-29 DIAGNOSIS — IMO0001 Reserved for inherently not codable concepts without codable children: Secondary | ICD-10-CM | POA: Insufficient documentation

## 2013-10-29 NOTE — Progress Notes (Signed)
Physical Therapy Treatment Patient Details  Name: [name] MRN: [id number] Date of Birth: 21-Dec-1946  Today's Date: 10/29/2013 Time: 2130-8657 PT Time Calculation (min): 57 min Charge: TE 8469-6295, Manual 1552-1604, Ice 2841-3244  Visit#: 10 of 14  Re-eval: 11/12/13 Assessment Diagnosis: Lt knee pain Next MD Visit: Dr. Renaye Rakers ([place]) November  Authorization: Medicare, [facility]   Authorization Time Period: complete 8th visit  Authorization Visit#: 10 of 20   Subjective: Symptoms/Limitations Symptoms: Pt stated she thinks she did too much around the house over the weekend, pain scale 3/10 Pain Assessment Currently in Pain?: Yes Pain Score: 3  Pain Location: Knee Pain Orientation: Left;Anterior  Objective:   Exercise/Treatments Stretches Active Hamstring Stretch: 3 reps;30 seconds ITB Stretch: 3 reps;30 seconds;Limitations ITB Stretch Limitations: supine with rope Aerobic Stationary Bike: NuStep 10' seat 10 LE only, hills #3, level 4, SPM >110 Standing Lateral Step Up: 15 reps;Step Height: 4";Hand Hold: 1;Left Forward Step Up: Left;15 reps;Hand Hold: 0;Step Height: 6" Step Down: Left;15 reps;Step Height: 6";Hand Hold: 0 Wall Squat: 10 reps;10 seconds;Limitations Wall Squat Limitations: ER Supine Short Arc Quad Sets: Left;20 reps;Limitations Short Arc Quad Sets Limitations: ER w/ 4# Bridges: Both;20 reps Straight Leg Raise with External Rotation: Left;20 reps;Limitations Straight Leg Raise with External Rotation Limitations: 4# Sidelying Hip ABduction: 15 reps;Limitations Hip ABduction Limitations: Rt S/L raindbow   Manual Therapy Manual Therapy: Myofascial release Joint Mobilization: patella mobs all directions and tib/fib mobs for pain control Myofascial Release: MFR to Lt lateral knee, fibular head, ITband, and patella. Cryotherapy Number Minutes Cryotherapy: 10 Minutes Cryotherapy Location: Knee Type of Cryotherapy: Ice pack  Physical  Therapy Assessment and Plan PT Assessment and Plan Clinical Impression Statement: Progressed session focus on VMO strengthening, stretches and manual techniques to reduce restrictions and improve patella mobility/tracking. PT Plan: Focus on manual to adductor muscles and ITB.  Patella mobs, ITB stretching, quad stretching, hamstring stretching.  Add lateral step dows, hip s/l rainbows.     Goals Home Exercise Program Pt/caregiver will Perform Home Exercise Program: Independently PT Short Term Goals Time to Complete Short Term Goals: 3 weeks PT Short Term Goal 1: Pt will report Lt knee pain less than 3/10 during the evening hours for improved QOL.  PT Short Term Goal 1 - Progress: Progressing toward goal PT Short Term Goal 2: Pt will improve her body awareness and demonstrate even distrubution to RLE and LLE to decreae Lt knee pain.  PT Long Term Goals Time to Complete Long Term Goals:  (6 weeks) PT Long Term Goal 1: Pt will improve Lt knee prone knee flexion to 120 degrees without increased quadricep pain and Lt hip flexion with straight leg to 80 degrees for greater ease with going from sit to stand from a car.  PT Long Term Goal 2: Pt will improve her RLE 1 rep max to withing 10% of LLE 1 rep max for knee extension for greater ease with ascending and desecending 10 stairs with 1 handrail, to decreae load on LLE.  PT Long Term Goal 2 - Progress: Progressing toward goal Long Term Goal 3: Pt will improve FOTO score to Status > 54% and Limiation < 46%  Problem List Patient Active Problem List   Diagnosis Date Noted  . Left knee pain 09/18/2013  . Salmonella 05/24/2013  . Thrombocytopenia, unspecified 05/24/2013  . Acute gastroenteritis 05/21/2013  . Fever 05/21/2013  . Abnormal urinalysis 05/21/2013  . Hypokalemia 05/21/2013  . Dehydration 05/21/2013  . Coccydynia 05/07/2013  . Anemia  08/06/2012  . PUD (peptic ulcer disease) 02/06/2012  . OVERWEIGHT/OBESITY 12/21/2010  . HYPERTENSION  12/29/2009  . 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 GERD 12/29/2009  . HIATAL HERNIA 12/29/2009  . HEART MURMUR, SYSTOLIC 12/29/2009    PT - End of Session Activity Tolerance: Patient tolerated treatment well General Behavior During Therapy: WFL for tasks assessed/performed Cognition: WFL for tasks performed  GP    Juel Burrow 10/29/2013, 4:10 PM

## 2013-11-01 ENCOUNTER — Ambulatory Visit (HOSPITAL_COMMUNITY)
Admission: RE | Admit: 2013-11-01 | Discharge: 2013-11-01 | Disposition: A | Discharge status: Home / Self Care | Payer: Medicare Other | Source: Ambulatory Visit | Attending: Family Medicine | Admitting: Family Medicine

## 2013-11-01 DIAGNOSIS — M25562 Pain in left knee: Secondary | ICD-10-CM

## 2013-11-01 DIAGNOSIS — M533 Sacrococcygeal disorders, not elsewhere classified: Secondary | ICD-10-CM | POA: Diagnosis not present

## 2013-11-01 DIAGNOSIS — IMO0001 Reserved for inherently not codable concepts without codable children: Secondary | ICD-10-CM | POA: Diagnosis not present

## 2013-11-01 NOTE — Progress Notes (Signed)
Physical Therapy Treatment Patient Details  Name: [name] MRN: [id number] Date of Birth: 08-03-1946  Today's Date: 11/01/2013 Time: 4098-1191 PT Time Calculation (min): 53 min Charge: TE 4782-9562 Manual 1308-6578  Visit#: 11 of 14  Re-eval: 11/12/13 Assessment Diagnosis: Lt knee pain Next MD Visit: Dr. Renaye Rakers ([place]) November 18th  Authorization: Medicare, [facility]   Authorization Time Period: complete 8th visit  Authorization Visit#: 11 of 20   Subjective: Symptoms/Limitations Symptoms: Pt stated she was on her feet a lot yesterday with increased pain, pain decreased today pain scale 3/10.  Pt stated her knee cap has been popping more.   Pain Assessment Currently in Pain?: Yes Pain Score: 3  Pain Location: Knee Pain Orientation: Left  Objective:  Exercise/Treatments Stretches Active Hamstring Stretch: 3 reps;30 seconds ITB Stretch: 3 reps;30 seconds;Limitations ITB Stretch Limitations: supine with rope Aerobic Stationary Bike: NuStep 10' seat 10 LE only, hills #3, level 4, SPM >110 Supine Short Arc Quad Sets: Left;20 reps;Limitations Short Arc Quad Sets Limitations: Er w/ 4# Terminal Knee Extension: Left;20 reps Straight Leg Raise with External Rotation: Left;20 reps;Limitations Straight Leg Raise with External Rotation Limitations: 4# Patellar Mobs: patella mobs all directions, tib/fib Sidelying Hip ABduction: 20 reps;Limitations Hip ABduction Limitations: Rt S/L raindbow  Manual Therapy Manual Therapy: Myofascial release Myofascial Release: MFR to Lt ITband, hamstring and quad region to reduce fascial restions and pan in Rt S/L position with hands and "the stick"  Physical Therapy Assessment and Plan PT Assessment and Plan Clinical Impression Statement: Trestment focus on manual techniques to reduce fascial restrictions with main focus on IIT band and distal quad.  Able to reduce restrictions by 50% with pain reduce and improved gait mechanics.   Improved patella tracking following manual.   PT Plan: Focus on manual to adductor muscles and ITB.  Patella mobs, ITB stretching, quad stretching, hamstring stretching.  Add lateral step dows, hip s/l rainbows.     Goals Home Exercise Program Pt/caregiver will Perform Home Exercise Program: Independently PT Short Term Goals Time to Complete Short Term Goals: 3 weeks PT Short Term Goal 1: Pt will report Lt knee pain less than 3/10 during the evening hours for improved QOL.  PT Short Term Goal 1 - Progress: Progressing toward goal PT Short Term Goal 2: Pt will improve her body awareness and demonstrate even distrubution to RLE and LLE to decreae Lt knee pain.  PT Long Term Goals Time to Complete Long Term Goals:  (6 weeks) PT Long Term Goal 1: Pt will improve Lt knee prone knee flexion to 120 degrees without increased quadricep pain and Lt hip flexion with straight leg to 80 degrees for greater ease with going from sit to stand from a car.  PT Long Term Goal 2: Pt will improve her RLE 1 rep max to withing 10% of LLE 1 rep max for knee extension for greater ease with ascending and desecending 10 stairs with 1 handrail, to decreae load on LLE.  PT Long Term Goal 2 - Progress: Progressing toward goal Long Term Goal 3: Pt will improve FOTO score to Status > 54% and Limiation < 46%  Problem List Patient Active Problem List   Diagnosis Date Noted  . Left knee pain 09/18/2013  . Salmonella 05/24/2013  . Thrombocytopenia, unspecified 05/24/2013  . Acute gastroenteritis 05/21/2013  . Fever 05/21/2013  . Abnormal urinalysis 05/21/2013  . Hypokalemia 05/21/2013  . Dehydration 05/21/2013  . Coccydynia 05/07/2013  . Anemia 08/06/2012  . PUD (peptic ulcer  disease) 02/06/2012  . OVERWEIGHT/OBESITY 12/21/2010  . HYPERTENSION 12/29/2009  . 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 GERD 12/29/2009  . HIATAL HERNIA 12/29/2009  . HEART MURMUR, SYSTOLIC 12/29/2009    PT - End of Session Activity Tolerance:  Patient tolerated treatment well General Behavior During Therapy: WFL for tasks assessed/performed Cognition: WFL for tasks performed  GP    Juel Burrow 11/01/2013, 5:22 PM

## 2013-11-05 ENCOUNTER — Ambulatory Visit (HOSPITAL_COMMUNITY): Payer: Medicare Other

## 2013-11-06 ENCOUNTER — Ambulatory Visit (HOSPITAL_COMMUNITY)
Admission: RE | Admit: 2013-11-06 | Discharge: 2013-11-06 | Disposition: A | Discharge status: Home / Self Care | Payer: Medicare Other | Source: Ambulatory Visit | Attending: Family Medicine | Admitting: Family Medicine

## 2013-11-06 DIAGNOSIS — IMO0001 Reserved for inherently not codable concepts without codable children: Secondary | ICD-10-CM | POA: Diagnosis not present

## 2013-11-06 DIAGNOSIS — M533 Sacrococcygeal disorders, not elsewhere classified: Secondary | ICD-10-CM | POA: Diagnosis not present

## 2013-11-06 DIAGNOSIS — M25562 Pain in left knee: Secondary | ICD-10-CM

## 2013-11-06 NOTE — Evaluation (Signed)
Physical Therapy Re-Evaluation  Patient Details  Name: [name] MRN: [id number] Date of Birth: 1946-06-05  Today's Date: 11/06/2013 Time: 1605-1700 PT Time Calculation (min): 55 min Charges: 1 ROM Self Care: 4098-1191 Manual: 1630-1650 Ice: 1650-1700             Visit#: 12 of 14  Re-eval: 11/12/13 Assessment Diagnosis: Lt knee pain Next MD Visit: Dr. Renaye Rakers ([place]) November 18th  Authorization: Medicare, [facility]     Authorization Time Period: complete 8th visit  Authorization Visit#: 12 of 20   Subjective Symptoms/Limitations Symptoms: Reports her pain remains about a 3/10, until she stand on her leg for a few hours.  How long can you stand comfortably?: a couple hours (was 30 minutes)  Sensation/Coordination/Flexibility/Functional Tests Functional Tests Functional Tests: FOTO: Status: 55%, 45% (was 45%, Limitation 57%  Assessment RLE PROM (degrees) RLE Overall PROM Comments: prone knee flexion: 110 degrees with pain LLE PROM (degrees) LLE Overall PROM Comments: prone knee flexion: 110  degrees with pain 3/10 (was 105 degrees); Supine: 118 degrees  Exercise/Treatments  Manual Therapy Manual Therapy: Myofascial release Myofascial Release: MFR to Lt ITband, hamstring and quad region to reduce fascial restions and pan in Rt S/L position with hands and "the stick Cryotherapy Number Minutes Cryotherapy: 10 Minutes Cryotherapy Location: Knee Type of Cryotherapy: Ice pack  Physical Therapy Assessment and Plan PT Assessment and Plan Clinical Impression Statement: Ms. Knepp has attended 12 OP PT vists to address Lt knee/quadriceps pain with the following findings: LLE strength is 5/5,  has improved quadriceps flexibility with decreased pain at end range (remains about a 5/10), continues to have most difficulty with RLE leg strength causing increased compensations to LLE and likely causing increased pain.  At this time she has a significant quadriceps and ITB  muscle spasm which is likely causing most of discomfort and pain. Enocuraged pt to continue with quadrceps stretching to decrease pain. She is independent with her HEP including VMO strengthening and patellar mobilizations.  Pt will benefit from skilled therapeutic intervention in order to improve on the following deficits: Pain;Increased muscle spasms PT Frequency: Min 2X/week PT Duration:  (2 weeks) PT Plan: Focus on manual techniques to distal quadrcieps, patella and adductors.     Goals Home Exercise Program Pt/caregiver will Perform Home Exercise Program: Independently PT Goal: Perform Home Exercise Program - Progress: Met PT Short Term Goals Time to Complete Short Term Goals: 3 weeks PT Short Term Goal 1: Pt will report Lt knee pain less than 3/10 during the evening hours for improved QOL.  (average 3/10) PT Short Term Goal 1 - Progress: Met PT Short Term Goal 2: Pt will improve her body awareness and demonstrate even distrubution to RLE and LLE to decreae Lt knee pain.  PT Short Term Goal 2 - Progress: Met PT Short Term Goal 3 - Progress: Met PT Long Term Goals Time to Complete Long Term Goals:  (6 weeks) PT Long Term Goal 1: Pt will improve Lt knee prone knee flexion to 120 degrees without increased quadricep pain and Lt hip flexion with straight leg to 80 degrees for greater ease with going from sit to stand from a car.  (110) PT Long Term Goal 1 - Progress: Progressing toward goal PT Long Term Goal 2: Pt will improve her RLE 1 rep max to withing 10% of LLE 1 rep max for knee extension for greater ease with ascending and desecending 10 stairs with 1 handrail, to decreae load on LLE.  PT  Long Term Goal 2 - Progress: Not met Long Term Goal 3: Pt will improve FOTO score to Status > 54% and Limiation < 46% Long Term Goal 3 Progress: Met  Problem List Patient Active Problem List   Diagnosis Date Noted  . Left knee pain 09/18/2013  . Salmonella 05/24/2013  . Thrombocytopenia,  unspecified 05/24/2013  . Acute gastroenteritis 05/21/2013  . Fever 05/21/2013  . Abnormal urinalysis 05/21/2013  . Hypokalemia 05/21/2013  . Dehydration 05/21/2013  . Coccydynia 05/07/2013  . Anemia 08/06/2012  . PUD (peptic ulcer disease) 02/06/2012  . OVERWEIGHT/OBESITY 12/21/2010  . HYPERTENSION 12/29/2009  . 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 GERD 12/29/2009  . HIATAL HERNIA 12/29/2009  . HEART MURMUR, SYSTOLIC 12/29/2009    PT - End of Session Activity Tolerance: Patient tolerated treatment well General Behavior During Therapy: WFL for tasks assessed/performed Cognition: WFL for tasks performed  GP    Mazy Culton, MPT, ATC 11/06/2013, 5:59 PM  Physician Documentation Your signature is required to indicate approval of the treatment plan as stated above.  Please sign and either send electronically or make a copy of this report for your files and return this physician signed original.   Please mark one 1.__approve of plan  2. ___approve of plan with the following conditions.   ______________________________                                                          _____________________ Physician Signature                                                                                                             Date

## 2013-11-08 ENCOUNTER — Ambulatory Visit (HOSPITAL_COMMUNITY)
Admission: RE | Admit: 2013-11-08 | Discharge: 2013-11-08 | Disposition: A | Discharge status: Home / Self Care | Payer: Medicare Other | Source: Ambulatory Visit | Attending: Family Medicine | Admitting: Family Medicine

## 2013-11-08 DIAGNOSIS — M25562 Pain in left knee: Secondary | ICD-10-CM

## 2013-11-08 DIAGNOSIS — IMO0001 Reserved for inherently not codable concepts without codable children: Secondary | ICD-10-CM | POA: Diagnosis not present

## 2013-11-08 DIAGNOSIS — M533 Sacrococcygeal disorders, not elsewhere classified: Secondary | ICD-10-CM | POA: Diagnosis not present

## 2013-11-08 NOTE — Progress Notes (Signed)
Physical Therapy Treatment Patient Details  Name: [name] MRN: [id number] Date of Birth: 22-Jul-1946  Today's Date: 11/08/2013 Time: 1602-1640 PT Time Calculation (min): 38 min Charge: Manual [id number]  Visit#: 13 of 14  Re-eval: 11/12/13    Authorization: Medicare, [employer]   Authorization Time Period: complete 8th visit  Authorization Visit#: 13 of 20   Subjective: Symptoms/Limitations Symptoms: Pt stated pain continues Lt lateral knee Pain Assessment Currently in Pain?: Yes Pain Score: 3  Pain Location: Knee Pain Orientation: Left  Objective:   Exercise/Treatments Manual Therapy Manual Therapy: Myofascial release Myofascial Release: MFR to Lt ITband, hamstring and quad region to reduce fascial restions and pan in supine with hands and "the stick"  Physical Therapy Assessment and Plan PT Assessment and Plan Clinical Impression Statement: Pt continues to have several fascial restrictions to distal quadricep region and ITband, manual techniques complete to reduce fascial restrictions and reduce pain.  Ice was offered but declined due to pt had other errands to run, pt was encouraged to apply ice at home for pain control. PT Plan: F/u with MD apt on 11/12/2013.  Focus on manual techniques to distal quadrcieps, patella and adductors.     Goals Home Exercise Program Pt/caregiver will Perform Home Exercise Program: Independently PT Short Term Goals Time to Complete Short Term Goals: 3 weeks PT Short Term Goal 1: Pt will report Lt knee pain less than 3/10 during the evening hours for improved QOL.  (average 3/10) PT Short Term Goal 2: Pt will improve her body awareness and demonstrate even distrubution to RLE and LLE to decreae Lt knee pain.  PT Long Term Goals Time to Complete Long Term Goals:  (6 weeks) PT Long Term Goal 1: Pt will improve Lt knee prone knee flexion to 120 degrees without increased quadricep pain and Lt hip flexion with straight leg to 80 degrees for  greater ease with going from sit to stand from a car.  (110) PT Long Term Goal 2: Pt will improve her RLE 1 rep max to withing 10% of LLE 1 rep max for knee extension for greater ease with ascending and desecending 10 stairs with 1 handrail, to decreae load on LLE.  Long Term Goal 3: Pt will improve FOTO score to Status > 54% and Limiation < 46%  Problem List Patient Active Problem List   Diagnosis Date Noted  . Left knee pain 09/18/2013  . Salmonella 05/24/2013  . Thrombocytopenia, unspecified 05/24/2013  . Acute gastroenteritis 05/21/2013  . Fever 05/21/2013  . Abnormal urinalysis 05/21/2013  . Hypokalemia 05/21/2013  . Dehydration 05/21/2013  . Coccydynia 05/07/2013  . Anemia 08/06/2012  . PUD (peptic ulcer disease) 02/06/2012  . OVERWEIGHT/OBESITY 12/21/2010  . HYPERTENSION 12/29/2009  . 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 GERD 12/29/2009  . HIATAL HERNIA 12/29/2009  . HEART MURMUR, SYSTOLIC 12/29/2009    PT - End of Session Activity Tolerance: Patient tolerated treatment well General Behavior During Therapy: [facility] for tasks assessed/performed  GP    Juel Burrow 11/08/2013, 5:19 PM

## 2013-11-12 ENCOUNTER — Other Ambulatory Visit (HOSPITAL_COMMUNITY): Payer: Self-pay | Admitting: Orthopedic Surgery

## 2013-11-12 DIAGNOSIS — M25552 Pain in left hip: Secondary | ICD-10-CM

## 2013-11-12 DIAGNOSIS — M169 Osteoarthritis of hip, unspecified: Secondary | ICD-10-CM | POA: Diagnosis not present

## 2013-11-12 DIAGNOSIS — M25562 Pain in left knee: Secondary | ICD-10-CM

## 2013-11-13 ENCOUNTER — Ambulatory Visit (HOSPITAL_COMMUNITY)
Admission: RE | Admit: 2013-11-13 | Discharge: 2013-11-13 | Disposition: A | Discharge status: Home / Self Care | Payer: Medicare Other | Source: Ambulatory Visit | Attending: Physical Therapy | Admitting: Physical Therapy

## 2013-11-13 DIAGNOSIS — IMO0001 Reserved for inherently not codable concepts without codable children: Secondary | ICD-10-CM | POA: Diagnosis not present

## 2013-11-13 DIAGNOSIS — M25562 Pain in left knee: Secondary | ICD-10-CM

## 2013-11-13 DIAGNOSIS — M533 Sacrococcygeal disorders, not elsewhere classified: Secondary | ICD-10-CM | POA: Diagnosis not present

## 2013-11-13 NOTE — Progress Notes (Signed)
Physical Therapy Treatment Patient Details  Name: [name] MRN: [id number] Date of Birth: 01-Mar-1946  Today's Date: 11/13/2013 Time: 1725-1800 PT Time Calculation (min): 35 min Manual: 1725-1750 Ice: 1 Visit#: 14 of 18  Re-eval: 11/25/13    Authorization: Medicare, UHC   Authorization Time Period: complete 8th visit  Authorization Visit#: 14 of 20   Subjective: Symptoms/Limitations Symptoms: Pt reports MD is going to get an MRI of her hip.  Continues to have discomfort to her quadricep.   Precautions/Restrictions     Exercise/Treatments Manual Therapy Manual Therapy: Myofascial release Myofascial Release: MFR to Lt ITband, adductors, hamstring and quad region to reduce fascial restions and pan in supine  Cryotherapy Number Minutes Cryotherapy: 10 Minutes Cryotherapy Location: Knee Type of Cryotherapy: Ice pack  Physical Therapy Assessment and Plan PT Assessment and Plan Clinical Impression Statement: Continued with only manual therapy today as pt is a5/5 strength.  Had signficiant reduction in deep rectus femoris muscle spasms with increased ability for deeper MFR.  Pt reports decreased pain and greater ease with knee flexion.   PT Plan: continue with manual and stretching, add ice at end.     Goals    Problem List Patient Active Problem List   Diagnosis Date Noted  . Left knee pain 09/18/2013  . Salmonella 05/24/2013  . Thrombocytopenia, unspecified 05/24/2013  . Acute gastroenteritis 05/21/2013  . Fever 05/21/2013  . Abnormal urinalysis 05/21/2013  . Hypokalemia 05/21/2013  . Dehydration 05/21/2013  . Coccydynia 05/07/2013  . Anemia 08/06/2012  . PUD (peptic ulcer disease) 02/06/2012  . OVERWEIGHT/OBESITY 12/21/2010  . HYPERTENSION 12/29/2009  . 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 GERD 12/29/2009  . HIATAL HERNIA 12/29/2009  . HEART MURMUR, SYSTOLIC 12/29/2009    PT - End of Session Activity Tolerance: Patient tolerated treatment well General Behavior  During Therapy: [facility] for tasks assessed/performed  GP    Ticara Waner 11/13/2013, 6:24 PM

## 2013-11-14 ENCOUNTER — Emergency Department (HOSPITAL_COMMUNITY): Payer: Medicare Other

## 2013-11-14 ENCOUNTER — Ambulatory Visit (HOSPITAL_COMMUNITY): Payer: Medicare Other

## 2013-11-14 ENCOUNTER — Observation Stay (HOSPITAL_COMMUNITY)
Admission: EM | Admit: 2013-11-14 | Discharge: 2013-11-15 | Disposition: A | Discharge status: Home / Self Care | Payer: Medicare Other | Attending: Internal Medicine | Admitting: Internal Medicine

## 2013-11-14 ENCOUNTER — Encounter (HOSPITAL_COMMUNITY): Payer: Self-pay | Admitting: Emergency Medicine

## 2013-11-14 ENCOUNTER — Other Ambulatory Visit: Payer: Self-pay

## 2013-11-14 DIAGNOSIS — Z880 Allergy status to penicillin: Secondary | ICD-10-CM | POA: Insufficient documentation

## 2013-11-14 DIAGNOSIS — K219 Gastro-esophageal reflux disease without esophagitis: Secondary | ICD-10-CM

## 2013-11-14 DIAGNOSIS — M129 Arthropathy, unspecified: Secondary | ICD-10-CM | POA: Insufficient documentation

## 2013-11-14 DIAGNOSIS — Z881 Allergy status to other antibiotic agents status: Secondary | ICD-10-CM | POA: Diagnosis not present

## 2013-11-14 DIAGNOSIS — J45901 Unspecified asthma with (acute) exacerbation: Secondary | ICD-10-CM | POA: Diagnosis not present

## 2013-11-14 DIAGNOSIS — I10 Essential (primary) hypertension: Secondary | ICD-10-CM

## 2013-11-14 DIAGNOSIS — K449 Diaphragmatic hernia without obstruction or gangrene: Secondary | ICD-10-CM | POA: Diagnosis present

## 2013-11-14 DIAGNOSIS — R0789 Other chest pain: Secondary | ICD-10-CM | POA: Diagnosis not present

## 2013-11-14 DIAGNOSIS — Z8669 Personal history of other diseases of the nervous system and sense organs: Secondary | ICD-10-CM | POA: Insufficient documentation

## 2013-11-14 DIAGNOSIS — Z6835 Body mass index (BMI) 35.0-35.9, adult: Secondary | ICD-10-CM | POA: Diagnosis not present

## 2013-11-14 DIAGNOSIS — Z79899 Other long term (current) drug therapy: Secondary | ICD-10-CM | POA: Insufficient documentation

## 2013-11-14 DIAGNOSIS — D649 Anemia, unspecified: Secondary | ICD-10-CM | POA: Diagnosis not present

## 2013-11-14 DIAGNOSIS — R079 Chest pain, unspecified: Secondary | ICD-10-CM

## 2013-11-14 DIAGNOSIS — Z7982 Long term (current) use of aspirin: Secondary | ICD-10-CM | POA: Insufficient documentation

## 2013-11-14 DIAGNOSIS — Z888 Allergy status to other drugs, medicaments and biological substances status: Secondary | ICD-10-CM | POA: Insufficient documentation

## 2013-11-14 DIAGNOSIS — R0602 Shortness of breath: Secondary | ICD-10-CM | POA: Diagnosis not present

## 2013-11-14 DIAGNOSIS — K279 Peptic ulcer, site unspecified, unspecified as acute or chronic, without hemorrhage or perforation: Secondary | ICD-10-CM

## 2013-11-14 DIAGNOSIS — R011 Cardiac murmur, unspecified: Secondary | ICD-10-CM | POA: Insufficient documentation

## 2013-11-14 LAB — POCT I-STAT TROPONIN I: Troponin i, poc: 0 ng/mL (ref 0.00–0.08)

## 2013-11-14 LAB — BASIC METABOLIC PANEL
BUN: 20 mg/dL (ref 6–23)
CO2: 27 mEq/L (ref 19–32)
Chloride: 106 mEq/L (ref 96–112)
Creatinine, Ser: 1.18 mg/dL — ABNORMAL HIGH (ref 0.50–1.10)
GFR calc Af Amer: 54 mL/min — ABNORMAL LOW (ref >90)
GFR calc non Af Amer: 47 mL/min — ABNORMAL LOW (ref >90)

## 2013-11-14 LAB — CBC
HCT: 38.2 % (ref 36.0–46.0)
MCHC: 33 g/dL (ref 30.0–36.0)
MCV: 89.7 fL (ref 78.0–100.0)
RDW: 15 % (ref 11.5–15.5)

## 2013-11-14 LAB — TROPONIN I: Troponin I: <0.3 ng/mL (ref <0.30)

## 2013-11-14 MED ORDER — HYDROCHLOROTHIAZIDE 25 MG PO TABS
12.5000 mg | ORAL_TABLET | Freq: Every day | ORAL | Status: DC
  Filled 2013-11-14 (×2): qty 0.5

## 2013-11-14 MED ORDER — ACETAMINOPHEN 325 MG PO TABS: 650.0000 mg | ORAL_TABLET | ORAL | Status: DC | PRN

## 2013-11-14 MED ORDER — PANTOPRAZOLE SODIUM 40 MG IV SOLR
40.0000 mg | INTRAVENOUS | Status: AC
  Administered 2013-11-14: 40 mg via INTRAVENOUS
  Filled 2013-11-14: qty 40

## 2013-11-14 MED ORDER — MONTELUKAST SODIUM 10 MG PO TABS
10.0000 mg | ORAL_TABLET | Freq: Every morning | ORAL | Status: DC
  Administered 2013-11-15: 10 mg via ORAL
  Filled 2013-11-14: qty 1

## 2013-11-14 MED ORDER — NITROGLYCERIN 0.4 MG SL SUBL: 0.4000 mg | SUBLINGUAL_TABLET | SUBLINGUAL | Status: DC | PRN

## 2013-11-14 MED ORDER — ENOXAPARIN SODIUM 40 MG/0.4ML ~~LOC~~ SOLN
40.0000 mg | SUBCUTANEOUS | Status: DC
  Administered 2013-11-14: 40 mg via SUBCUTANEOUS
  Filled 2013-11-14 (×2): qty 0.4

## 2013-11-14 MED ORDER — FERROUS SULFATE 325 (65 FE) MG PO TABS
325.0000 mg | ORAL_TABLET | Freq: Every day | ORAL | Status: DC
  Administered 2013-11-15: 325 mg via ORAL
  Filled 2013-11-14 (×2): qty 1

## 2013-11-14 MED ORDER — ASPIRIN EC 81 MG PO TBEC
81.0000 mg | DELAYED_RELEASE_TABLET | Freq: Every morning | ORAL | Status: DC
  Administered 2013-11-15: 81 mg via ORAL
  Filled 2013-11-14: qty 1

## 2013-11-14 MED ORDER — DIAZEPAM 5 MG PO TABS: 5.0000 mg | ORAL_TABLET | Freq: Every day | ORAL | Status: DC | PRN

## 2013-11-14 MED ORDER — ONDANSETRON HCL 4 MG/2ML IJ SOLN: 4.0000 mg | Freq: Four times a day (QID) | INTRAMUSCULAR | Status: DC | PRN

## 2013-11-14 MED ORDER — GABAPENTIN 300 MG PO CAPS
300.0000 mg | ORAL_CAPSULE | Freq: Every day | ORAL | Status: DC
  Administered 2013-11-14: 300 mg via ORAL
  Filled 2013-11-14 (×2): qty 1

## 2013-11-14 MED ORDER — PANTOPRAZOLE SODIUM 40 MG PO TBEC
40.0000 mg | DELAYED_RELEASE_TABLET | Freq: Every day | ORAL | Status: DC
  Administered 2013-11-15: 40 mg via ORAL
  Filled 2013-11-14: qty 1

## 2013-11-14 NOTE — ED Provider Notes (Signed)
CSN: [id number]     Arrival date & time 11/14/13  1603 History   First MD Initiated Contact with Patient 11/14/13 1610     Chief Complaint  Patient presents with  . Chest Pain   (Consider location/radiation/quality/duration/timing/severity/associated sxs/prior Treatment) HPI Comments: Patient is a 67 year old female with a past medical history of hypertension, GERD, hiatal hernia who presents with chest pain since last night. Patient reports sudden onset of central chest pain that radiates to bilateral sides of her neck. The pain is described as a pressure and is severe. The pain was continuous for about 10 hours until she followed up with her doctor this morning and was given aspirin and Nitro. Patient reports relief of the pain with nitro. Patient reports associated SOB. No aggravating factors. She has never experienced this previously.    Past Medical History  Diagnosis Date  . Irregular heart beat   . Asthma   . GERD (gastroesophageal reflux disease)   . Arthritis   . Anginal pain     cardiac clearance on the chart-pt also has GI promblems  . Murmur      Since Birth followed by Dr Daleen Squibb.  . H/O hiatal hernia     'Sliding"  . 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 in left ear.  Left  side of body weaker than right.  Carpal tunnel bil .  Marland Kitchen Carpal tunnel syndrome     bilateral- wears splints  . Ulcer     gastric  . Anemia     takes iron   Past Surgical History  Procedure Laterality Date  . Appendectomy    . Cholecystectomy    . Neck surgery      x 2   . Knee arthroscopy      Left  . Esophagogastroduodenoscopy  12/08/2011    Procedure: ESOPHAGOGASTRODUODENOSCOPY (EGD);  Surgeon: Malissa Hippo, MD;  [facility];  Service: Endoscopy;  Laterality: N/A;  3:00   . Colonoscopy    . Upper gastrointestinal endoscopy    . Hysterscopy    . Total knee arthroplasty  07/11/2012    Procedure: TOTAL KNEE ARTHROPLASTY;  Surgeon: Loreta Ave, MD;  Location: [facility] OR;  Service: Orthopedics;  Laterality: Left;  left total knee arthroplasty  . Tonsillectomy    . Eye surgery Bilateral     cataract surgery  . Colonoscopy N/A 09/26/2013    Procedure: COLONOSCOPY;  Surgeon: Malissa Hippo, MD;  Location: [facility];  Service: Endoscopy;  Laterality: N/A;  1030-rescheduled to 12:00pm Ann notified pt   Family History  Problem Relation Age of Onset  . Colon cancer Neg Hx   . Hypertension Son    History  Substance Use Topics  . Smoking status: Never Smoker   . Smokeless tobacco: Never Used  . Alcohol Use: No   OB History   Grav Para Term Preterm Abortions TAB SAB Ect Mult Living                 Review of Systems  Respiratory: Positive for shortness of breath.   Cardiovascular: Positive for chest pain.  All other systems reviewed and are negative.    Allergies  Nsaids; Cephalexin; Darvocet; Percocet; and Penicillins  Home Medications   Current Outpatient Rx  Name  Route  Sig  Dispense  Refill  . alendronate (FOSAMAX) 70 MG tablet   Oral   Take 70 mg by mouth every 7 (  seven) days. Take with a full glass of water on an empty stomach. *takes on Mondays**         . aspirin EC 81 MG tablet   Oral   Take 81 mg by mouth every morning.         . Biotin 1000 MCG tablet   Oral   Take 1,000 mcg by mouth 3 (three) times daily.         . Cholecalciferol (VITAMIN D) 2000 UNITS CAPS   Oral   Take 1 capsule by mouth daily.           . diazepam (VALIUM) 5 MG tablet   Oral   Take 2.5 mg by mouth 2 (two) times daily.          . ferrous sulfate 325 (65 FE) MG tablet   Oral   Take 325 mg by mouth daily with breakfast.         . gabapentin (NEURONTIN) 300 MG capsule   Oral   Take 300 mg by mouth 2 (two) times daily.          . hydrochlorothiazide (HYDRODIURIL) 25 MG tablet   Oral   Take 12.5 mg by mouth daily.         Marland Kitchen ibuprofen (ADVIL,MOTRIN) 200 MG tablet   Oral   Take 200 mg by mouth  every 6 (six) hours as needed for pain.         . montelukast (SINGULAIR) 10 MG tablet   Oral   Take 10 mg by mouth every morning.          Marland Kitchen NEXIUM 40 MG capsule      TAKE 1 CAPSULE DAILY BEFOREBREAKFAST   90 capsule   3   . Probiotic Product (PROBIOTIC FORMULA) CAPS   Oral   Take 1 capsule by mouth daily.          There were no vitals taken for this visit. Physical Exam  Nursing note and vitals reviewed. Constitutional: She is oriented to person, place, and time. She appears well-developed and well-nourished. No distress.  HENT:  Head: Normocephalic and atraumatic.  Eyes: Conjunctivae and EOM are normal.  Neck: Normal range of motion.  Cardiovascular: Normal rate and regular rhythm.  Exam reveals no gallop and no friction rub.   No murmur heard. Pulmonary/Chest: Effort normal. She has no wheezes. She has no rales. She exhibits no tenderness.  Abnormal lung sound noted on deep inspiration in left lower lobe, possibly due to patient's hiatal hernia.   Abdominal: Soft. She exhibits no distension. There is no tenderness. There is no rebound and no guarding.  Musculoskeletal: Normal range of motion.  Neurological: She is alert and oriented to person, place, and time. Coordination normal.  Speech is goal-oriented. Moves limbs without ataxia.   Skin: Skin is warm and dry.  Psychiatric: She has a normal mood and affect.    ED Course  Procedures (including critical care time) Labs Review Labs Reviewed  BASIC METABOLIC PANEL - Abnormal; Notable for the following:    Glucose, Bld 110 (*)    Creatinine, Ser 1.18 (*)    GFR calc non Af Amer 47 (*)    GFR calc Af Amer 54 (*)    All other components within normal limits  CBC  POCT I-STAT TROPONIN I  POCT I-STAT TROPONIN I   Imaging Review Dg Chest 2 View  11/14/2013   CLINICAL DATA:  Chest pain and shortness of breath. History of asthma.  EXAM: CHEST  2 VIEW  COMPARISON:  05/24/2012  FINDINGS: Lungs are clear.  Cardiomediastinal silhouette is within normal. There is a moderate size hiatal hernia with air-fluid level present. Remainder of the exam is unchanged.  IMPRESSION: No active cardiopulmonary disease.  Moderate size hiatal hernia.   Electronically Signed   By: Elberta Fortis M.D.   On: 11/14/2013 18:05    EKG Interpretation   None       MDM   1. Chest pain     4:42 PM Labs and chest xray pending. Vitals stable and patient afebrile. Patient no longer having chest pain.   8:41 PM Labs unremarkable. Patient is moderate risk based on HEART score. Patient will be admitted for observation.   Emilia Beck, [place] 11/14/13 2041

## 2013-11-14 NOTE — H&P (Signed)
[facility] History and Physical  Patient: [name]  WJX:[id number]  DOB: 03-07-1946  DOS: the patient was seen and examined on 11/14/2013 PCP: Colette Ribas, MD  Chief Complaint: Chest pressure  HPI: [name] is a 67 y.o. female with Past medical history of GERD, hiatal hernia, hypertension. The patient is coming from home. The patient presented with the complaint of chest pressure that started yesterday. She mentions the pain was located in the center of her chest and was radiated into her neck. When the pain was started due to severe and then eased out but continued throughout the night. She took some TUMS and went back to sleep. When she woke up in the morning and was doing her laundry work she continued to have the pain which got worse in intensity. After which she contacted her doctor who provided her with sublingual nitroglycerin and the pressure was relieved. She also had shortness of breath doing her daily activity this morning. She denies any similar episode in the past. She denies any cardiac interventional procedures in the past. She had an echocardiogram for heart murmur.  Review of Systems: as mentioned in the history of present illness.  A Comprehensive review of the other systems is negative.  Past Medical History  Diagnosis Date  . Irregular heart beat   . Asthma   . GERD (gastroesophageal reflux disease)   . Arthritis   . Anginal pain     cardiac clearance on the chart-pt also has GI promblems  . Murmur      Since Birth followed by Dr Daleen Squibb.  . H/O hiatal hernia     'Sliding"  . 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 in left ear.  Left  side of body weaker than right.  Carpal tunnel bil .  Marland Kitchen Carpal tunnel syndrome     bilateral- wears splints  . Ulcer     gastric  . Anemia     takes iron   Past Surgical History  Procedure Laterality Date  . Appendectomy    . Cholecystectomy    . Neck surgery       x 2   . Knee arthroscopy      Left  . Esophagogastroduodenoscopy  12/08/2011    Procedure: ESOPHAGOGASTRODUODENOSCOPY (EGD);  Surgeon: Malissa Hippo, MD;  [facility]: [facility];  Service: Endoscopy;  Laterality: N/A;  3:00   . Colonoscopy    . Upper gastrointestinal endoscopy    . Hysterscopy    . Total knee arthroplasty  07/11/2012    Procedure: TOTAL KNEE ARTHROPLASTY;  Surgeon: Loreta Ave, MD;  Location: [facility] OR;  Service: Orthopedics;  Laterality: Left;  left total knee arthroplasty  . Tonsillectomy    . Eye surgery Bilateral     cataract surgery  . Colonoscopy N/A 09/26/2013    Procedure: COLONOSCOPY;  Surgeon: Malissa Hippo, MD;  Location: [facility];  Service: Endoscopy;  Laterality: N/A;  1030-rescheduled to 12:00pm Ann notified pt   Social History:  reports that she has never smoked. She has never used smokeless tobacco. She reports that she does not drink alcohol or use illicit drugs. Independent for most of her  ADL.  Allergies  Allergen Reactions  . Nsaids Other (See Comments)    Caused Ulcers.  . Cephalexin     unknown  . Darvocet [Propoxyphene-Acetaminophen]     nausea  . Percocet [Oxycodone-Acetaminophen] Nausea Only  . Penicillins Rash and  Other (See Comments)    Redness    Family History  Problem Relation Age of Onset  . Colon cancer Neg Hx   . Hypertension Son     Prior to Admission medications   Medication Sig Start Date End Date Taking? Authorizing Provider  aspirin EC 81 MG tablet Take 81 mg by mouth every morning.   Yes Historical Provider, MD  Biotin 1000 MCG tablet Take 1,000 mcg by mouth every evening.    Yes Historical Provider, MD  Cholecalciferol (VITAMIN D) 2000 UNITS CAPS Take 1 capsule by mouth daily.     Yes Historical Provider, MD  diazepam (VALIUM) 5 MG tablet Take 5 mg by mouth daily.    Yes Historical Provider, MD  esomeprazole (NEXIUM) 40 MG capsule Take 40 mg by mouth daily before breakfast.   Yes Historical Provider, MD   ferrous sulfate 325 (65 FE) MG tablet Take 325 mg by mouth daily with breakfast.   Yes Historical Provider, MD  gabapentin (NEURONTIN) 300 MG capsule Take 300 mg by mouth at bedtime.    Yes Historical Provider, MD  hydrochlorothiazide (HYDRODIURIL) 25 MG tablet Take 12.5 mg by mouth daily.   Yes Historical Provider, MD  ibuprofen (ADVIL,MOTRIN) 200 MG tablet Take 200 mg by mouth every 6 (six) hours as needed for pain.   Yes Historical Provider, MD  montelukast (SINGULAIR) 10 MG tablet Take 10 mg by mouth every morning.    Yes Historical Provider, MD  Probiotic Product (PROBIOTIC FORMULA) CAPS Take 1 capsule by mouth daily.   Yes Historical Provider, MD    Physical Exam: Filed Vitals:   11/14/13 2000 11/14/13 2012 11/14/13 2015 11/14/13 2030  BP: 144/65 144/65 143/63 131/67  Pulse: 78 75 77 77  Resp: 14 18 16 19   SpO2: 99% 98% 99% 97%    General: Alert, Awake and Oriented to Time, Place and Person. Appear in no distress Eyes: PERRL ENT: Oral Mucosa clear moist. Neck: No JVD Cardiovascular: S1 and S2 Present, aortic systolic Murmur, Peripheral Pulses Present Respiratory: Bilateral Air entry equal and Decreased, Clear to Auscultation,  No Crackles, no wheezes Abdomen: Bowel Sound Present, Soft and Non tender Skin: No Rash Extremities: Trace Pedal edema, no calf tenderness Neurologic: Grossly Unremarkable.  Labs on Admission:  CBC:  Recent Labs Lab 11/14/13 1621  WBC 8.4  HGB 12.6  HCT 38.2  MCV 89.7  PLT 236    CMP     Component Value Date/Time   NA 141 11/14/2013 1621   K 3.7 11/14/2013 1621   CL 106 11/14/2013 1621   CO2 27 11/14/2013 1621   GLUCOSE 110* 11/14/2013 1621   BUN 20 11/14/2013 1621   CREATININE 1.18* 11/14/2013 1621   CALCIUM 9.2 11/14/2013 1621   PROT 6.3 05/22/2013 0456   ALBUMIN 3.4* 05/22/2013 0456   AST 23 05/22/2013 0456   ALT 23 05/22/2013 0456   ALKPHOS 80 05/22/2013 0456   BILITOT 0.4 05/22/2013 0456   GFRNONAA 47* 11/14/2013 1621   GFRAA  54* 11/14/2013 1621    No results found for this basename: LIPASE, AMYLASE,  in the last 168 hours No results found for this basename: AMMONIA,  in the last 168 hours  No results found for this basename: CKTOTAL, CKMB, CKMBINDEX, TROPONINI,  in the last 168 hours BNP (last 3 results) No results found for this basename: PROBNP,  in the last 8760 hours  Radiological Exams on Admission: Dg Chest 2 View  11/14/2013   CLINICAL DATA:  Chest pain and shortness of breath. History of asthma.  EXAM: CHEST  2 VIEW  COMPARISON:  05/24/2012  FINDINGS: Lungs are clear. Cardiomediastinal silhouette is within normal. There is a moderate size hiatal hernia with air-fluid level present. Remainder of the exam is unchanged.  IMPRESSION: No active cardiopulmonary disease.  Moderate size hiatal hernia.   Electronically Signed   By: Elberta Fortis M.D.   On: 11/14/2013 18:05    EKG: Independently reviewed. normal EKG, normal sinus rhythm.  Assessment/Plan Principal Problem:   Chest pain at rest Active Problems:   HYPERTENSION   GERD   HIATAL HERNIA   1. Chest pain at rest The patient is presenting with chest pain that started at rest yesterday and felt like pressure. She is currently chest pain-free, her chest pain resolved with nitroglycerin. Her initial EKG and troponin does not show any acute abnormality. Considering her history of  Pressure-like sensation, resolved with nitroglycerin, and worsen with daily movement association with shortness of breath, she will be admitted for observation to rule out ACS. She has chronic leg swelling without any cough tenderness and has been fairly active at her baseline, she is not hypoxic or tachycardic, therefore probability of of having a PE is less likely. Other possibility include a GERD and hiatal hernia which could mimic cardiac chest pain. Continue her on aspirin and Protonix When necessary nitroglycerin for chest pain Monitor on telemetry Follow serial  troponins  2. Hypertension Continue her home antihypertensive medications  DVT Prophylaxis: subcutaneous Heparin Nutrition: Cardiac diet  Code Status: Full  Family Communication: Husband was present at bedside, opportunity was given to ask question and all questions were answered satisfactorily at the time of interview. Disposition: Admitted to observation in telemetry unit.  Author: Lynden Oxford, MD [facility] Pager: [phone] 11/14/2013, 9:14 PM    If 7PM-7AM, please contact night-coverage [email or web address] Password [email or web address]

## 2013-11-14 NOTE — ED Notes (Signed)
Pt was at home and developed central CP. PT took a baby aspirin. Pt went to MD's office and received another baby aspirin and nitro x 2. Pt currently complaining of no chest pain. Pt complains of lumbpn throat. HX of sliding hiatal hernia and GERD.  Pt on 4L of O2 with EMS and pt stated it made her feel better.  NSR on EKG.

## 2013-11-15 ENCOUNTER — Ambulatory Visit (HOSPITAL_COMMUNITY): Payer: Medicare Other

## 2013-11-15 DIAGNOSIS — R079 Chest pain, unspecified: Secondary | ICD-10-CM | POA: Diagnosis not present

## 2013-11-15 DIAGNOSIS — K279 Peptic ulcer, site unspecified, unspecified as acute or chronic, without hemorrhage or perforation: Secondary | ICD-10-CM | POA: Diagnosis not present

## 2013-11-15 DIAGNOSIS — I10 Essential (primary) hypertension: Secondary | ICD-10-CM | POA: Diagnosis not present

## 2013-11-15 DIAGNOSIS — R072 Precordial pain: Secondary | ICD-10-CM | POA: Diagnosis not present

## 2013-11-15 LAB — COMPREHENSIVE METABOLIC PANEL
ALT: 18 U/L (ref 0–35)
AST: 15 U/L (ref 0–37)
Albumin: 3.5 g/dL (ref 3.5–5.2)
CO2: 25 mEq/L (ref 19–32)
Calcium: 8.7 mg/dL (ref 8.4–10.5)
Chloride: 108 mEq/L (ref 96–112)
Creatinine, Ser: 0.85 mg/dL (ref 0.50–1.10)
Glucose, Bld: 111 mg/dL — ABNORMAL HIGH (ref 70–99)
Potassium: 3.5 mEq/L (ref 3.5–5.1)
Sodium: 143 mEq/L (ref 135–145)
Total Bilirubin: 0.5 mg/dL (ref 0.3–1.2)

## 2013-11-15 LAB — CBC
HCT: 38.2 % (ref 36.0–46.0)
Hemoglobin: 12.5 g/dL (ref 12.0–15.0)
MCV: 89.9 fL (ref 78.0–100.0)
Platelets: 221 10*3/uL (ref 150–400)
RBC: 4.25 MIL/uL (ref 3.87–5.11)
RDW: 15.1 % (ref 11.5–15.5)
WBC: 8 10*3/uL (ref 4.0–10.5)

## 2013-11-15 LAB — TROPONIN I
Troponin I: <0.3 ng/mL (ref <0.30)
Troponin I: <0.3 ng/mL (ref <0.30)

## 2013-11-15 LAB — PROTIME-INR
INR: 0.99 (ref 0.00–1.49)
Prothrombin Time: 12.9 seconds (ref 11.6–15.2)

## 2013-11-15 MED ORDER — NITROGLYCERIN 0.4 MG SL SUBL: 0.4000 mg | SUBLINGUAL_TABLET | SUBLINGUAL | Status: DC | PRN

## 2013-11-15 MED ORDER — HYDROCHLOROTHIAZIDE 12.5 MG PO CAPS
12.5000 mg | ORAL_CAPSULE | Freq: Every day | ORAL | Status: DC
  Administered 2013-11-15: 12.5 mg via ORAL
  Filled 2013-11-15: qty 1

## 2013-11-15 NOTE — Discharge Summary (Signed)
Physician Discharge Summary  Patient ID: [name] MRN: [id number] DOB/AGE: February 11, 1946 67 y.o.  Admit date: 11/14/2013 Discharge date: 11/15/2013  Primary Care Physician:  Colette Ribas, MD  Discharge Diagnoses:   Atypical chest pain - resolved  . HYPERTENSION . GERD . HIATAL HERNIA   Consults: None   Recommendations for Outpatient Follow-up:  Patient was recommended to have outpatient stress test if her chest pain symptoms return. She also has a history of hiatal hernia and GERD and may need GI evaluation if she has repeat chest pain symptoms  Allergies:   Allergies  Allergen Reactions  . Nsaids Other (See Comments)    Caused Ulcers.  . Cephalexin     unknown  . Darvocet [Propoxyphene-Acetaminophen]     nausea  . Percocet [Oxycodone-Acetaminophen] Nausea Only  . Penicillins Rash and Other (See Comments)    Redness     Discharge Medications:   Medication List    STOP taking these medications       ibuprofen 200 MG tablet  Commonly known as:  ADVIL,MOTRIN      TAKE these medications       aspirin EC 81 MG tablet  Take 81 mg by mouth every morning.     Biotin 1000 MCG tablet  Take 1,000 mcg by mouth every evening.     diazepam 5 MG tablet  Commonly known as:  VALIUM  Take 5 mg by mouth daily.     esomeprazole 40 MG capsule  Commonly known as:  NEXIUM  Take 40 mg by mouth daily before breakfast.     ferrous sulfate 325 (65 FE) MG tablet  Take 325 mg by mouth daily with breakfast.     gabapentin 300 MG capsule  Commonly known as:  NEURONTIN  Take 300 mg by mouth at bedtime.     hydrochlorothiazide 25 MG tablet  Commonly known as:  HYDRODIURIL  Take 12.5 mg by mouth daily.     montelukast 10 MG tablet  Commonly known as:  SINGULAIR  Take 10 mg by mouth every morning.     nitroGLYCERIN 0.4 MG SL tablet  Commonly known as:  NITROSTAT  Place 1 tablet (0.4 mg total) under the tongue every 5 (five) minutes as needed for chest pain.      PROBIOTIC FORMULA Caps  Take 1 capsule by mouth daily.     Vitamin D 2000 UNITS Caps  Take 1 capsule by mouth daily.         Brief H and P: For complete details please refer to admission H and P, but in brief the patient is a 67 year old female with past medical history of GERD, hiatal hernia, hypertension presented with chest pain that started a day before admission, located in the central chest and was radiating into her neck. Patient reports that she took some TUMS and went back to sleep but woke up in the morning and was doing her laundry work when she started having the pain again. She contacted her primary care physician who provided her with sublingual nitroglycerin and the pressure was resolved, patient presented to the ER for further workup  Hospital Course:     Chest pain at rest: Patient was admitted for rule out ACS, 3 sets of cardiac enzymes remain negative, EKG was negative for any acute ischemic changes. She does have a history of hiatal hernia and GERD, she was continued on PPI. 2-D echo was done which showed EF of 60-65%, normal wall motion, no regional wall  motion abnormalities.      HYPERTENSION: Remained stable     GERD  HIATAL HERNIA -Patient was recommended to discontinue ibuprofen And continue Nexium daily. If she has any further chest pain episodes, she could have further GI evaluation outpatient    Day of Discharge BP 129/64  Pulse 74  Temp(Src) 97.8 F (36.6 C) (Oral)  Resp 18  Ht 5\' 3"  (1.6 m)  Wt 92.035 kg (202 lb 14.4 oz)  BMI 35.95 kg/m2  SpO2 96%  Physical Exam: General: Alert and awake oriented x3 not in any acute distress. CVS: S1-S2 clear no murmur rubs or gallops Chest: clear to auscultation bilaterally, no wheezing rales or rhonchi Abdomen: soft nontender, nondistended, normal bowel sounds Extremities: no cyanosis, clubbing or edema noted bilaterally Neuro: Cranial nerves II-XII intact, no focal neurological deficits   The results of  significant diagnostics from this hospitalization (including imaging, microbiology, ancillary and laboratory) are listed below for reference.    LAB RESULTS: Basic Metabolic Panel:  Recent Labs Lab 11/14/13 1621 11/15/13 0525  NA 141 143  K 3.7 3.5  CL 106 108  CO2 27 25  GLUCOSE 110* 111*  BUN 20 18  CREATININE 1.18* 0.85  CALCIUM 9.2 8.7   Liver Function Tests:  Recent Labs Lab 11/15/13 0525  AST 15  ALT 18  ALKPHOS 74  BILITOT 0.5  PROT 6.0  ALBUMIN 3.5   No results found for this basename: LIPASE, AMYLASE,  in the last 168 hours No results found for this basename: AMMONIA,  in the last 168 hours CBC:  Recent Labs Lab 11/14/13 1621 11/15/13 0525  WBC 8.4 8.0  HGB 12.6 12.5  HCT 38.2 38.2  MCV 89.7 89.9  PLT 236 221   Cardiac Enzymes:  Recent Labs Lab 11/15/13 0525 11/15/13 1025  TROPONINI <0.30 <0.30   BNP: No components found with this basename: POCBNP,  CBG: No results found for this basename: GLUCAP,  in the last 168 hours  Significant Diagnostic Studies:  Dg Chest 2 View  11/14/2013   CLINICAL DATA:  Chest pain and shortness of breath. History of asthma.  EXAM: CHEST  2 VIEW  COMPARISON:  05/24/2012  FINDINGS: Lungs are clear. Cardiomediastinal silhouette is within normal. There is a moderate size hiatal hernia with air-fluid level present. Remainder of the exam is unchanged.  IMPRESSION: No active cardiopulmonary disease.  Moderate size hiatal hernia.   Electronically Signed   By: Elberta Fortis M.D.   On: 11/14/2013 18:05    2D ECHO: Study Conclusions  - Left ventricle: The cavity size was normal. Systolic function was normal. The estimated ejection fraction was in the range of 60% to 65%. Wall motion was normal; there were no regional wall motion abnormalities. - Left atrium: The atrium was mildly dilated. - Atrial septum: No defect or patent foramen ovale was identified. - Pulmonary arteries: PA peak pressure: 35mm Hg  (S).    Disposition and Follow-up:      Future Appointments Provider Department Dept Phone   11/18/2013 2:00 PM [facility] [phone]   Patient to arrive 15 minutes prior to appointment time.   11/20/2013 5:30 PM Matilde Haymaker, PT [facility] [phone]   11/25/2013 4:00 PM Matilde Haymaker, PT [facility] [phone]       DISPOSITION: Home  DIET: Heart healthy  ACTIVITY: As tolerated    DISCHARGE FOLLOW-UP Follow-up Information   Follow up with Colette Ribas, MD. Schedule  an appointment as soon as possible for a visit in 2 weeks.   Specialty:  Family Medicine   Contact information:   [address] [place] [address] [phone]       Time spent on Discharge: 30 mins  Signed:   Chasidy Janak M.D. [facility] 11/15/2013, 4:36 PM Pager: [phone]

## 2013-11-15 NOTE — Progress Notes (Signed)
Pt provided with dc instructions and education. Pt verbalized understanding. Pt ahs no questions at this time. Prescription handed to patient. Leaving with family for home. IV removed with tip intact. Heart monitor cleaned and returned to front. Levonne Spiller, RN

## 2013-11-15 NOTE — Progress Notes (Signed)
  Echocardiogram 2D Echocardiogram has been performed.  Cathie Beams 11/15/2013, 11:54 AM

## 2013-11-15 NOTE — Progress Notes (Signed)
UR completed 

## 2013-11-17 NOTE — ED Provider Notes (Signed)
Medical screening examination/treatment/procedure(s) were conducted as a shared visit with non-physician practitioner(s) and myself.  I personally evaluated the patient during the encounter.  EKG Interpretation    Date/Time:  Thursday November 14 2013 16:06:41 EST Ventricular Rate:  83 PR Interval:  205 QRS Duration: 91 QT Interval:  371 QTC Calculation: 436 R Axis:   85 Text Interpretation:  Sinus rhythm Borderline right axis deviation Low voltage, precordial leads RSR' in V1 or V2, probably normal variant ED PHYSICIAN INTERPRETATION AVAILABLE IN [facility] Confirmed by TEST, RECORD ([id number]), editor CLAYTON  CCT  CETT, ROBIN (2) on 11/16/2013 8:39:03 AM            Patient with chest pain with moderate risk factors. Will admit to obs for ACS r/o  Audree Camel, MD 11/17/13 1510

## 2013-11-18 ENCOUNTER — Ambulatory Visit (HOSPITAL_COMMUNITY)
Admission: RE | Admit: 2013-11-18 | Discharge: 2013-11-18 | Disposition: A | Discharge status: Home / Self Care | Payer: Medicare Other | Source: Ambulatory Visit | Attending: Orthopedic Surgery | Admitting: Orthopedic Surgery

## 2013-11-18 DIAGNOSIS — M25559 Pain in unspecified hip: Secondary | ICD-10-CM | POA: Diagnosis not present

## 2013-11-18 DIAGNOSIS — M25552 Pain in left hip: Secondary | ICD-10-CM

## 2013-11-18 DIAGNOSIS — M25562 Pain in left knee: Secondary | ICD-10-CM

## 2013-11-18 DIAGNOSIS — M25459 Effusion, unspecified hip: Secondary | ICD-10-CM | POA: Diagnosis not present

## 2013-11-20 ENCOUNTER — Ambulatory Visit (HOSPITAL_COMMUNITY): Payer: Medicare Other | Admitting: Physical Therapy

## 2013-11-20 DIAGNOSIS — K449 Diaphragmatic hernia without obstruction or gangrene: Secondary | ICD-10-CM | POA: Diagnosis not present

## 2013-11-20 DIAGNOSIS — R079 Chest pain, unspecified: Secondary | ICD-10-CM | POA: Diagnosis not present

## 2013-11-20 DIAGNOSIS — Z6835 Body mass index (BMI) 35.0-35.9, adult: Secondary | ICD-10-CM | POA: Diagnosis not present

## 2013-11-25 ENCOUNTER — Ambulatory Visit (HOSPITAL_COMMUNITY): Payer: Medicare Other | Admitting: Physical Therapy

## 2013-11-28 DIAGNOSIS — I781 Nevus, non-neoplastic: Secondary | ICD-10-CM | POA: Diagnosis not present

## 2013-11-28 DIAGNOSIS — C44319 Basal cell carcinoma of skin of other parts of face: Secondary | ICD-10-CM | POA: Diagnosis not present

## 2013-11-28 DIAGNOSIS — C44711 Basal cell carcinoma of skin of unspecified lower limb, including hip: Secondary | ICD-10-CM | POA: Diagnosis not present

## 2013-11-28 DIAGNOSIS — L570 Actinic keratosis: Secondary | ICD-10-CM | POA: Diagnosis not present

## 2013-12-02 ENCOUNTER — Ambulatory Visit (INDEPENDENT_AMBULATORY_CARE_PROVIDER_SITE_OTHER): Payer: Medicare Other | Admitting: Internal Medicine

## 2013-12-02 ENCOUNTER — Encounter (INDEPENDENT_AMBULATORY_CARE_PROVIDER_SITE_OTHER): Payer: Self-pay | Admitting: Internal Medicine

## 2013-12-02 VITALS — BP 130/62 | HR 80 | Temp 98.0°F | Ht 63.0 in | Wt 206.5 lb

## 2013-12-02 DIAGNOSIS — K219 Gastro-esophageal reflux disease without esophagitis: Secondary | ICD-10-CM | POA: Diagnosis not present

## 2013-12-02 MED ORDER — OMEPRAZOLE 20 MG PO CPDR: 20.0000 mg | DELAYED_RELEASE_CAPSULE | Freq: Two times a day (BID) | ORAL | Status: DC

## 2013-12-02 NOTE — Progress Notes (Signed)
Subjective:     Patient ID: [name], female   DOB: 10/02/1946, 67 y.o.   MRN: [id number]  HPI Here today for f/u after a recent ER visit. She was seen in the ED in November. She was having chest pains. She was seen by Dr Phillips Odor and was sent to [facility]. Echo was normal.  She will have a stress test on the 19th of th is month.  She was told they though her pain was caused by her GERD. She occasionally has chest pain. She has not taken any NTG for her chest. She has a wedge on her bed and sleep on a pillow. At night her acid reflux is better. Appetite is good. No weight loss. She is careful what she eats. She cannot bend over or it feels like it will hang.  She has frequent acid reflux in the am.     Any NSAIDS  09/26/2013 Colonoscopy: Prep excellent.  Very tortuous colon. Slims cold used to complete exam.  Normal mucosa throughout.  Normal mucosa of rectum and anorectal junction except focal thickening of anoderm.   2009 EGD: No endoscopic evidence of reflux esophagitis. Moderate sized sliding hiatal hernia with wide open hiatus.  Suspect her GE reflux disease symptoms are poorly controlled secondary to moderate sized hernia.  11/15/2013 Echo: normal. EF 65.    Review of Systems see hpi Past Medical History  Diagnosis Date  . Irregular heart beat   . Asthma   . GERD (gastroesophageal reflux disease)   . Arthritis   . Anginal pain     cardiac clearance on the chart-pt also has GI promblems  . Murmur      Since Birth followed by Dr Daleen Squibb.  . H/O hiatal hernia     'Sliding"  . 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 in left ear.  Left  side of body weaker than right.  Carpal tunnel bil .  Marland Kitchen Carpal tunnel syndrome     bilateral- wears splints  . Ulcer     gastric  . Anemia     takes iron   Current Outpatient Prescriptions on File Prior to Visit  Medication Sig Dispense Refill  . aspirin EC 81 MG tablet Take 81 mg by mouth  every morning.      . Biotin 1000 MCG tablet Take 1,000 mcg by mouth every evening.       . Cholecalciferol (VITAMIN D) 2000 UNITS CAPS Take 1 capsule by mouth daily.        . diazepam (VALIUM) 5 MG tablet Take 5 mg by mouth daily.       Marland Kitchen esomeprazole (NEXIUM) 40 MG capsule Take 40 mg by mouth daily before breakfast.      . ferrous sulfate 325 (65 FE) MG tablet Take 325 mg by mouth daily with breakfast.      . gabapentin (NEURONTIN) 300 MG capsule Take 300 mg by mouth at bedtime.       . hydrochlorothiazide (HYDRODIURIL) 25 MG tablet Take 12.5 mg by mouth daily.      . montelukast (SINGULAIR) 10 MG tablet Take 10 mg by mouth every morning.       . nitroGLYCERIN (NITROSTAT) 0.4 MG SL tablet Place 1 tablet (0.4 mg total) under the tongue every 5 (five) minutes as needed for chest pain.  30 tablet  12  . Probiotic Product (PROBIOTIC FORMULA) CAPS Take 1 capsule by mouth daily.  No current facility-administered medications on file prior to visit.   Past Surgical History  Procedure Laterality Date  . Appendectomy    . Cholecystectomy    . Neck surgery      x 2   . Knee arthroscopy      Left  . Esophagogastroduodenoscopy  12/08/2011    Procedure: ESOPHAGOGASTRODUODENOSCOPY (EGD);  Surgeon: Malissa Hippo, MD;  Location: [facility];  Service: Endoscopy;  Laterality: N/A;  3:00   . Colonoscopy    . Upper gastrointestinal endoscopy    . Hysterscopy    . Total knee arthroplasty  07/11/2012    Procedure: TOTAL KNEE ARTHROPLASTY;  Surgeon: Loreta Ave, MD;  Location: [facility] OR;  Service: Orthopedics;  Laterality: Left;  left total knee arthroplasty  . Tonsillectomy    . Eye surgery Bilateral     cataract surgery  . Colonoscopy N/A 09/26/2013    Procedure: COLONOSCOPY;  Surgeon: Malissa Hippo, MD;  Location: [facility];  Service: Endoscopy;  Laterality: N/A;  1030-rescheduled to 12:00pm Ann notified pt   Allergies  Allergen Reactions  . Nsaids Other (See Comments)    Caused  Ulcers.  . Cephalexin     unknown  . Darvocet [Propoxyphene-Acetaminophen]     nausea  . Percocet [Oxycodone-Acetaminophen] Nausea Only  . Penicillins Rash and Other (See Comments)    Redness       Objective:   Physical Exam  Filed Vitals:   12/02/13 1028  BP: 130/62  Pulse: 80  Temp: 98 F (36.7 C)  Height: 5\' 3"  (1.6 m)  Weight: 206 lb 8 oz (93.668 kg)   Alert and oriented. Skin warm and dry. Oral mucosa is moist.   . Sclera anicteric, conjunctivae is pink. Thyroid not enlarged. No cervical lymphadenopathy. Lungs clear. Heart regular rate and rhythm.  Abdomen is soft. Bowel sounds are positive. No hepatomegaly. No abdominal masses felt. No tenderness.  No edema to lower extremities.      Assessment:    GERD. Hx of moderate size hiatal hernia. She says when she lies flat she can feel the acid reflux in her esophagus climbing up. Negative echo. She will undergo a stress test on the 19th of this month.     Plan:    I advised her to get the stress test out of the way first. Will switch her to omeprzole 20mg  BID. She will have an OV in 2 months pending her stress test. She may need referral to surgery.

## 2013-12-02 NOTE — Patient Instructions (Signed)
Will address after she has the stress test. Will start on Omeprazole 20mg  BID. OV in 2 months

## 2013-12-09 DIAGNOSIS — J309 Allergic rhinitis, unspecified: Secondary | ICD-10-CM | POA: Diagnosis not present

## 2013-12-09 DIAGNOSIS — J029 Acute pharyngitis, unspecified: Secondary | ICD-10-CM | POA: Diagnosis not present

## 2013-12-09 DIAGNOSIS — Z6835 Body mass index (BMI) 35.0-35.9, adult: Secondary | ICD-10-CM | POA: Diagnosis not present

## 2013-12-13 DIAGNOSIS — R079 Chest pain, unspecified: Secondary | ICD-10-CM | POA: Diagnosis not present

## 2013-12-30 DIAGNOSIS — R079 Chest pain, unspecified: Secondary | ICD-10-CM | POA: Diagnosis not present

## 2014-01-02 DIAGNOSIS — Z85828 Personal history of other malignant neoplasm of skin: Secondary | ICD-10-CM | POA: Diagnosis not present

## 2014-01-02 DIAGNOSIS — C44711 Basal cell carcinoma of skin of unspecified lower limb, including hip: Secondary | ICD-10-CM | POA: Diagnosis not present

## 2014-01-21 ENCOUNTER — Encounter (HOSPITAL_COMMUNITY): Payer: Self-pay | Admitting: Emergency Medicine

## 2014-01-21 ENCOUNTER — Emergency Department (HOSPITAL_COMMUNITY): Payer: Medicare Other

## 2014-01-21 ENCOUNTER — Emergency Department (HOSPITAL_COMMUNITY)
Admission: EM | Admit: 2014-01-21 | Discharge: 2014-01-21 | Disposition: A | Discharge status: Home / Self Care | Payer: Medicare Other | Attending: Emergency Medicine | Admitting: Emergency Medicine

## 2014-01-21 DIAGNOSIS — D649 Anemia, unspecified: Secondary | ICD-10-CM | POA: Insufficient documentation

## 2014-01-21 DIAGNOSIS — K1120 Sialoadenitis, unspecified: Secondary | ICD-10-CM | POA: Insufficient documentation

## 2014-01-21 DIAGNOSIS — Z79899 Other long term (current) drug therapy: Secondary | ICD-10-CM | POA: Insufficient documentation

## 2014-01-21 DIAGNOSIS — K219 Gastro-esophageal reflux disease without esophagitis: Secondary | ICD-10-CM | POA: Insufficient documentation

## 2014-01-21 DIAGNOSIS — R011 Cardiac murmur, unspecified: Secondary | ICD-10-CM | POA: Insufficient documentation

## 2014-01-21 DIAGNOSIS — R6884 Jaw pain: Secondary | ICD-10-CM | POA: Insufficient documentation

## 2014-01-21 DIAGNOSIS — Z7982 Long term (current) use of aspirin: Secondary | ICD-10-CM | POA: Diagnosis not present

## 2014-01-21 DIAGNOSIS — I209 Angina pectoris, unspecified: Secondary | ICD-10-CM | POA: Insufficient documentation

## 2014-01-21 DIAGNOSIS — Z8669 Personal history of other diseases of the nervous system and sense organs: Secondary | ICD-10-CM | POA: Diagnosis not present

## 2014-01-21 DIAGNOSIS — J45909 Unspecified asthma, uncomplicated: Secondary | ICD-10-CM | POA: Diagnosis not present

## 2014-01-21 DIAGNOSIS — I499 Cardiac arrhythmia, unspecified: Secondary | ICD-10-CM | POA: Diagnosis not present

## 2014-01-21 DIAGNOSIS — Z8739 Personal history of other diseases of the musculoskeletal system and connective tissue: Secondary | ICD-10-CM | POA: Diagnosis not present

## 2014-01-21 MED ORDER — CLINDAMYCIN HCL 300 MG PO CAPS: 300.0000 mg | ORAL_CAPSULE | Freq: Three times a day (TID) | ORAL | Status: DC

## 2014-01-21 NOTE — ED Notes (Signed)
Pt seen and eval by EDPa for initial assessment.

## 2014-01-21 NOTE — Discharge Instructions (Signed)
Parotitis °Parotitis is soreness and swelling (inflammation) of one or both parotid glands. The parotid glands produce saliva. They are located on each side of the face, below and in front of the earlobes. The saliva produced comes out of tiny openings (ducts) inside the cheeks. In most cases, parotitis goes away over time or with treatment. If your parotitis is caused by certain long-term (chronic) diseases, it may come back again.  °CAUSES  °Parotitis can be caused by: °· Viral infections. Mumps is one viral infection that can cause parotitis. °· Bacterial infections. °· Blockage of the salivary ducts due to a salivary stone. °· Narrowing of the salivary ducts. °· Swelling of the salivary ducts. °· Dehydration. °· Autoimmune conditions, such as sarcoidosis or Sjogren's syndrome. °· Air from activities such as scuba diving, glass blowing, or playing an instrument (rare). °· Human immunodeficiency virus (HIV) or acquired immunodeficiency syndrome (AIDS). °· Tuberculosis. °SYMPTOMS  °· The ears may appear to be pushed up and out from their normal position. °· Redness (erythema) of the skin over the parotid glands. °· Pain and tenderness over the parotid glands. °· Swelling in the parotid gland area. °· Yellowish-white fluid (pus) coming from the ducts inside the cheeks. °· Dry mouth. °· Bad taste in the mouth. °DIAGNOSIS  °Your caregiver may determine that you have parotitis based on your symptoms and a physical exam. A sample of fluid may also be taken from the parotid gland and tested to find the cause of your infection. X-rays or computed tomography (CT) scans may be taken if your caregiver thinks you might have a salivary stone blocking your salivary duct. °TREATMENT  °Treatment varies depending upon the cause of your parotitis. If your parotitis is caused by mumps, no treatment is needed. The condition will go away on its own after 7 to 10 days. In other cases, treatment may include: °· Antibiotics if your  infection was caused by bacteria. °· Pain medicines. °· Gland massage. °· Eating sour candy to increase your saliva production. °· Removal of salivary stones. Your caregiver may flush stones out with fluids or remove them with tweezers. °· Surgery to remove the parotid glands. °HOME CARE INSTRUCTIONS  °· If you were given antibiotics, take them as directed. Finish them even if you start to feel better. °· Put warm compresses on the sore area. °· Only take over-the-counter or prescription medicines for pain, discomfort, or fever as directed by your caregiver. °· Drink enough fluids to keep your urine clear or pale yellow. °SEEK IMMEDIATE MEDICAL CARE IF:  °· You have increasing pain or swelling that is not controlled with medicine. °· You have a fever. °MAKE SURE YOU: °· Understand these instructions. °· Will watch your condition. °· Will get help right away if you are not doing well or get worse. °Document Released: 06/03/2002 Document Revised: 03/05/2012 Document Reviewed: 11/07/2011 °[employer]® Patient Information ©2014 [employer]. ° °

## 2014-01-21 NOTE — ED Notes (Signed)
Reports was eating lunch when right side of face/jaw started swelling suddenly.  States pain to jaw with movement.  Denies swelling to tongue/throat, denies difficulty swallowing/difficulty breathing.  Reports pressure to right ear.

## 2014-01-21 NOTE — ED Provider Notes (Signed)
CSN: [id number]     Arrival date & time 01/21/14  1428 History   First MD Initiated Contact with Patient 01/21/14 1659     Chief Complaint  Patient presents with  . Facial Swelling   (Consider location/radiation/quality/duration/timing/severity/associated sxs/prior Treatment) HPI Comments: [name] is a 68 y.o. female who presents to the Emergency Department complaining of sudden onset of right jaw pain and swelling that began while eating.  Patient's spouse reports he noticed swelling "the size of an egg" at onset and states the swelling slowly diminished.  Patient denies pain with opening or closing her mouth, but does states that she  Hears a "popping" sound.  She also denies difficulty swallowing or breathing, fever, sore throat, neck pain or swelling.    The history is provided by the patient.    Past Medical History  Diagnosis Date  . Irregular heart beat   . Asthma   . GERD (gastroesophageal reflux disease)   . Arthritis   . Anginal pain     cardiac clearance on the chart-pt also has GI promblems  . Murmur      Since Birth followed by Dr Verl Blalock.  . H/O hiatal hernia     'Sliding"  . 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 in left ear.  Left  side of body weaker than right.  Carpal tunnel bil .  Marland Kitchen Carpal tunnel syndrome     bilateral- wears splints  . Ulcer     gastric  . Anemia     takes iron   Past Surgical History  Procedure Laterality Date  . Appendectomy    . Cholecystectomy    . Neck surgery      x 2   . Knee arthroscopy      Left  . Esophagogastroduodenoscopy  12/08/2011    Procedure: ESOPHAGOGASTRODUODENOSCOPY (EGD);  Surgeon: Rogene Houston, MD;  [facility]: [facility];  Service: Endoscopy;  Laterality: N/A;  3:00   . Colonoscopy    . Upper gastrointestinal endoscopy    . Hysterscopy    . Total knee arthroplasty  07/11/2012    Procedure: TOTAL KNEE ARTHROPLASTY;  Surgeon: Ninetta Lights, MD;  Location: [place];   Service: Orthopedics;  Laterality: Left;  left total knee arthroplasty  . Tonsillectomy    . Eye surgery Bilateral     cataract surgery  . Colonoscopy N/A 09/26/2013    Procedure: COLONOSCOPY;  Surgeon: Rogene Houston, MD;  Location: [facility];  Service: Endoscopy;  Laterality: N/A;  1030-rescheduled to 12:00pm Ann notified pt   Family History  Problem Relation Age of Onset  . Colon cancer Neg Hx   . Hypertension Son    History  Substance Use Topics  . Smoking status: Never Smoker   . Smokeless tobacco: Never Used  . Alcohol Use: No   OB History   Grav Para Term Preterm Abortions TAB SAB Ect Mult Living                 Review of Systems  Constitutional: Negative for fever and chills.  HENT: Positive for facial swelling. Negative for congestion, ear pain, sore throat, tinnitus, trouble swallowing and voice change.   Eyes: Negative for visual disturbance.  Respiratory: Negative for chest tightness, shortness of breath and wheezing.   Gastrointestinal: Negative for nausea and vomiting.  Skin: Negative for rash.  Neurological: Negative for dizziness, seizures, syncope, speech difficulty, weakness, numbness and headaches.  Hematological:  Negative for adenopathy.  All other systems reviewed and are negative.    Allergies  Nsaids; Cephalexin; Darvocet; Percocet; and Penicillins  Home Medications   Current Outpatient Rx  Name  Route  Sig  Dispense  Refill  . aspirin EC 81 MG tablet   Oral   Take 81 mg by mouth every morning.         . Biotin 1000 MCG tablet   Oral   Take 1,000 mcg by mouth every evening.          . Cholecalciferol (VITAMIN D) 2000 UNITS CAPS   Oral   Take 1 capsule by mouth daily.           . diazepam (VALIUM) 5 MG tablet   Oral   Take 5 mg by mouth daily.          . ferrous sulfate 325 (65 FE) MG tablet   Oral   Take 325 mg by mouth daily with breakfast.         . gabapentin (NEURONTIN) 300 MG capsule   Oral   Take 300 mg by  mouth at bedtime.          . hydrochlorothiazide (HYDRODIURIL) 25 MG tablet   Oral   Take 12.5 mg by mouth daily.         . montelukast (SINGULAIR) 10 MG tablet   Oral   Take 10 mg by mouth every morning.          . nitroGLYCERIN (NITROSTAT) 0.4 MG SL tablet   Sublingual   Place 1 tablet (0.4 mg total) under the tongue every 5 (five) minutes as needed for chest pain.   30 tablet   12   . omeprazole (PRILOSEC) 20 MG capsule   Oral   Take 1 capsule (20 mg total) by mouth 2 (two) times daily before a meal.   60 capsule   3   . Probiotic Product (PROBIOTIC FORMULA) CAPS   Oral   Take 1 capsule by mouth daily.          BP 151/68  Pulse 84  Temp(Src) 98.2 F (36.8 C) (Oral)  Resp 18  Ht 5\' 3"  (1.6 m)  Wt 200 lb (90.719 kg)  BMI 35.44 kg/m2  SpO2 97% Physical Exam  Nursing note and vitals reviewed. Constitutional: She is oriented to person, place, and time. She appears well-developed and well-nourished. No distress.  HENT:  Head: Normocephalic and atraumatic.  Right Ear: Tympanic membrane and ear canal normal.  Left Ear: Tympanic membrane and ear canal normal.  Mouth/Throat: Uvula is midline, oropharynx is clear and moist and mucous membranes are normal. No oral lesions. No trismus in the jaw. No dental abscesses, uvula swelling or dental caries.  Palpable mass to the right TMJ.  Crepitus with opening and closing the mouth.  No obvious dislocation of the joint.  Airway patent, uvula midline.  No dental caries  Eyes: Conjunctivae and EOM are normal. Pupils are equal, round, and reactive to light.  Neck: Normal range of motion, full passive range of motion without pain and phonation normal. Neck supple. No spinous process tenderness and no muscular tenderness present. Normal range of motion present. No thyromegaly present.  Cardiovascular: Normal rate, regular rhythm, normal heart sounds and intact distal pulses.   No murmur heard. Pulmonary/Chest: Effort normal and  breath sounds normal. No stridor. No respiratory distress.  Musculoskeletal: Normal range of motion.  Lymphadenopathy:    She has no cervical adenopathy.  Neurological:  She is alert and oriented to person, place, and time. She exhibits normal muscle tone. Coordination normal.  Skin: Skin is warm and dry.    ED Course  Procedures (including critical care time) Labs Review Labs Reviewed - No data to display Imaging Review Ct Maxillofacial Wo Cm  01/21/2014   CLINICAL DATA:  Sudden onset of swelling in the right side of the face and jaw.  EXAM: CT MAXILLOFACIAL WITHOUT CONTRAST  TECHNIQUE: Multidetector CT imaging of the maxillofacial structures was performed. Multiplanar CT image reconstructions were also generated. A small metallic BB was placed on the right temple in order to reliably differentiate right from left.  COMPARISON:  None.  FINDINGS: Inflammatory changes are present about the right parotid gland. There is no evidence for ductal obstruction. There is some edema adjacent to the tail of the right parotid gland. Reactive size lymph nodes are present on the right. No significant mass lesion is present. Note is made of accessory parotid tissue bilaterally, stable.  No focal mucosal or submucosal lesions are evident.  The patient is status post fusion of the upper cervical spine at C4-5. Sclerotic lesions are present within the spinous process of C3 and the right lamina of C2. The mandible is intact and located.  A polyp or mucous retention cyst is evident in the inferior right maxillary sinus. The remaining paranasal sinuses and the mastoid air cells are clear.  IMPRESSION: 1. Asymmetric inflammatory changes and reactive size lymph nodes about the right parotid gland compatible with parotitis. There is no evidence for duct obstruction or mass lesion. 2. Sclerotic lesions at C2 and C3 are concerning for metastatic disease. Question prostate cancer.   Electronically Signed   By: Lawrence Santiago M.D.    On: 01/21/2014 18:23    EKG Interpretation   None       MDM   Consulted Dr. Irish Elders regarding CT result.  Radiologist also mentioned possibility of bone islands.  Patient is female.  Patient was questioned about hx of CA.  Has hx of neg yearly mammograms.  Advised pt of results and importance of close follow-up with her PMD for possible bone scan  Will treat with clindamycin.  Has allergy to NSAID.  Agrees to warm compresses and sour candy.  Referral given to ENT.  Dr. Benjamine Mola.  Patient verbalized understanding and agrees to f/u.  Pt stable for discharge.   Kayona Foor L. Vanessa , PA-C 01/22/14 1952

## 2014-01-22 ENCOUNTER — Other Ambulatory Visit (HOSPITAL_COMMUNITY): Payer: Self-pay | Admitting: Family Medicine

## 2014-01-22 DIAGNOSIS — Z139 Encounter for screening, unspecified: Secondary | ICD-10-CM

## 2014-01-23 ENCOUNTER — Ambulatory Visit (HOSPITAL_COMMUNITY)
Admission: RE | Admit: 2014-01-23 | Discharge: 2014-01-23 | Disposition: A | Discharge status: Home / Self Care | Payer: Medicare Other | Source: Ambulatory Visit | Attending: Family Medicine | Admitting: Family Medicine

## 2014-01-23 DIAGNOSIS — Z1231 Encounter for screening mammogram for malignant neoplasm of breast: Secondary | ICD-10-CM | POA: Diagnosis not present

## 2014-01-23 DIAGNOSIS — Z139 Encounter for screening, unspecified: Secondary | ICD-10-CM

## 2014-01-25 NOTE — ED Provider Notes (Signed)
Medical screening examination/treatment/procedure(s) were performed by non-physician practitioner and as supervising physician I was immediately available for consultation/collaboration.  EKG Interpretation   None        Valena Ivanov R. Mayrin Schmuck, MD 01/25/14 2337 

## 2014-01-28 DIAGNOSIS — K1120 Sialoadenitis, unspecified: Secondary | ICD-10-CM | POA: Diagnosis not present

## 2014-01-29 DIAGNOSIS — Z6836 Body mass index (BMI) 36.0-36.9, adult: Secondary | ICD-10-CM | POA: Diagnosis not present

## 2014-01-29 DIAGNOSIS — M5382 Other specified dorsopathies, cervical region: Secondary | ICD-10-CM | POA: Diagnosis not present

## 2014-02-03 ENCOUNTER — Ambulatory Visit (INDEPENDENT_AMBULATORY_CARE_PROVIDER_SITE_OTHER): Payer: Medicare Other | Admitting: Internal Medicine

## 2014-02-05 ENCOUNTER — Other Ambulatory Visit (HOSPITAL_COMMUNITY): Payer: Self-pay | Admitting: Family Medicine

## 2014-02-05 DIAGNOSIS — M5382 Other specified dorsopathies, cervical region: Secondary | ICD-10-CM

## 2014-02-06 DIAGNOSIS — Z85828 Personal history of other malignant neoplasm of skin: Secondary | ICD-10-CM | POA: Diagnosis not present

## 2014-02-10 ENCOUNTER — Encounter (HOSPITAL_COMMUNITY): Payer: Self-pay

## 2014-02-10 ENCOUNTER — Encounter (HOSPITAL_COMMUNITY)
Admission: RE | Admit: 2014-02-10 | Discharge: 2014-02-10 | Disposition: A | Discharge status: Home / Self Care | Payer: Medicare Other | Source: Ambulatory Visit | Attending: Family Medicine | Admitting: Family Medicine

## 2014-02-10 DIAGNOSIS — K1120 Sialoadenitis, unspecified: Secondary | ICD-10-CM | POA: Diagnosis not present

## 2014-02-10 DIAGNOSIS — R937 Abnormal findings on diagnostic imaging of other parts of musculoskeletal system: Secondary | ICD-10-CM | POA: Diagnosis not present

## 2014-02-10 DIAGNOSIS — IMO0002 Reserved for concepts with insufficient information to code with codable children: Secondary | ICD-10-CM | POA: Diagnosis not present

## 2014-02-10 DIAGNOSIS — M5382 Other specified dorsopathies, cervical region: Secondary | ICD-10-CM

## 2014-02-10 DIAGNOSIS — M1710 Unilateral primary osteoarthritis, unspecified knee: Secondary | ICD-10-CM | POA: Diagnosis not present

## 2014-02-10 MED ORDER — TECHNETIUM TC 99M MEDRONATE IV KIT
25.0000 | PACK | Freq: Once | INTRAVENOUS | Status: AC | PRN
  Administered 2014-02-10: 25 via INTRAVENOUS

## 2014-02-13 ENCOUNTER — Other Ambulatory Visit (HOSPITAL_COMMUNITY): Payer: Self-pay | Admitting: Family Medicine

## 2014-02-13 ENCOUNTER — Ambulatory Visit (HOSPITAL_COMMUNITY)
Admission: RE | Admit: 2014-02-13 | Discharge: 2014-02-13 | Disposition: A | Discharge status: Home / Self Care | Payer: Medicare Other | Source: Ambulatory Visit | Attending: Family Medicine | Admitting: Family Medicine

## 2014-02-13 DIAGNOSIS — R6889 Other general symptoms and signs: Secondary | ICD-10-CM

## 2014-02-13 DIAGNOSIS — M899 Disorder of bone, unspecified: Secondary | ICD-10-CM | POA: Diagnosis not present

## 2014-02-13 DIAGNOSIS — M949 Disorder of cartilage, unspecified: Secondary | ICD-10-CM | POA: Diagnosis not present

## 2014-02-13 DIAGNOSIS — R937 Abnormal findings on diagnostic imaging of other parts of musculoskeletal system: Secondary | ICD-10-CM | POA: Insufficient documentation

## 2014-03-07 DIAGNOSIS — M542 Cervicalgia: Secondary | ICD-10-CM | POA: Diagnosis not present

## 2014-03-07 DIAGNOSIS — Q799 Congenital malformation of musculoskeletal system, unspecified: Secondary | ICD-10-CM | POA: Diagnosis not present

## 2014-03-07 DIAGNOSIS — Z6836 Body mass index (BMI) 36.0-36.9, adult: Secondary | ICD-10-CM | POA: Diagnosis not present

## 2014-03-10 DIAGNOSIS — I10 Essential (primary) hypertension: Secondary | ICD-10-CM | POA: Diagnosis not present

## 2014-03-10 DIAGNOSIS — Z6836 Body mass index (BMI) 36.0-36.9, adult: Secondary | ICD-10-CM | POA: Diagnosis not present

## 2014-03-10 DIAGNOSIS — J309 Allergic rhinitis, unspecified: Secondary | ICD-10-CM | POA: Diagnosis not present

## 2014-03-25 DIAGNOSIS — Z961 Presence of intraocular lens: Secondary | ICD-10-CM | POA: Diagnosis not present

## 2014-04-28 ENCOUNTER — Other Ambulatory Visit (INDEPENDENT_AMBULATORY_CARE_PROVIDER_SITE_OTHER): Payer: Self-pay | Admitting: Internal Medicine

## 2014-04-29 ENCOUNTER — Other Ambulatory Visit (INDEPENDENT_AMBULATORY_CARE_PROVIDER_SITE_OTHER): Payer: Self-pay | Admitting: Internal Medicine

## 2014-04-29 DIAGNOSIS — M25569 Pain in unspecified knee: Secondary | ICD-10-CM | POA: Diagnosis not present

## 2014-04-29 DIAGNOSIS — K219 Gastro-esophageal reflux disease without esophagitis: Secondary | ICD-10-CM

## 2014-04-29 MED ORDER — OMEPRAZOLE 20 MG PO CPDR: DELAYED_RELEASE_CAPSULE | ORAL | Status: DC

## 2014-05-08 ENCOUNTER — Encounter: Payer: Self-pay | Admitting: Obstetrics and Gynecology

## 2014-05-08 ENCOUNTER — Other Ambulatory Visit (HOSPITAL_COMMUNITY)
Admission: RE | Admit: 2014-05-08 | Discharge: 2014-05-08 | Disposition: A | Discharge status: Home / Self Care | Payer: Medicare Other | Source: Ambulatory Visit | Attending: Obstetrics and Gynecology | Admitting: Obstetrics and Gynecology

## 2014-05-08 ENCOUNTER — Ambulatory Visit (INDEPENDENT_AMBULATORY_CARE_PROVIDER_SITE_OTHER): Payer: Medicare Other | Admitting: Obstetrics and Gynecology

## 2014-05-08 VITALS — BP 140/78 | Ht 64.0 in | Wt 211.0 lb

## 2014-05-08 DIAGNOSIS — K921 Melena: Secondary | ICD-10-CM

## 2014-05-08 DIAGNOSIS — R195 Other fecal abnormalities: Secondary | ICD-10-CM

## 2014-05-08 DIAGNOSIS — Z124 Encounter for screening for malignant neoplasm of cervix: Secondary | ICD-10-CM | POA: Insufficient documentation

## 2014-05-08 DIAGNOSIS — Z1151 Encounter for screening for human papillomavirus (HPV): Secondary | ICD-10-CM | POA: Diagnosis not present

## 2014-05-08 NOTE — Progress Notes (Signed)
This chart was scribed by Jenne Campus, Medical Scribe, for Dr. Mallory Shirk on 05/08/14 at 2:47 PM. This chart was reviewed by Dr. Mallory Shirk for accuracy.  Assessment:  Annual Gyn Exam  Positive Guaiac Test HX of prior + hemoccults with negative w/u Plan:  1. pap smear done, next pap due 2 years 2. return annually or prn 3    Annual mammogram advised 4   Pt given 3 card hemoccult  Subjective:  [name] is a 68 y.o. female No obstetric history on file. who presents for annual exam. No LMP recorded. Patient is postmenopausal.  Last PAP done on 11/30/2011. Recent colonoscopy in October 2014. The patient has no complaints today. Admitted last year for salmonella. No recent illnesses. On neurontin for left-sided numbness and tingling at night. 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 Has f/u with Dr. Durene Cal for the same.  The following portions of the patient's history were reviewed and updated as appropriate: allergies, current medications, past family history, past medical history, past social history, past surgical history and problem list.   Review of Systems Constitutional: +hot flashes but tolerable per pt Gastrointestinal: +"reflux" symptoms Genitourinary: negative   Objective:  BP 140/78  Ht 5\' 4"  (1.626 m)  Wt 211 lb (95.709 kg)  BMI 36.20 kg/m2   BMI: Body mass index is 36.2 kg/(m^2).  Chaperone present for exam which was performed with pt's permission General Appearance: Alert, appropriate appearance for age. No acute distress HEENT: Grossly normal Neck / Thyroid:  Cardiovascular: RRR; normal S1, S2, no murmur Lungs: CTA bilaterally Back: No CVAT Breast Exam: No dimpling, nipple retraction or discharge. No masses or nodes., Normal to inspection and Normal breast tissue bilaterally Gastrointestinal: Soft, non-tender, no masses or organomegaly Pelvic Exam: External genitalia: normal general appearance Urinary system: urethral  meatus normal Vaginal: atrophic tissues , well supported , no cystocele or rectocele Cervix: normal appearance and thin prep PAP obtained Adnexa: normal bimanual exam Uterus: normal single, non-tender Rectal: No masses, good support, guaiac positive  Lymphatic Exam: Non-palpable nodes in neck, clavicular, axillary, or inguinal regions  Skin: no rash or abnormalities Neurologic: Normal gait and speech, no tremor  Psychiatric: Alert and oriented, appropriate affect.  Urinalysis:Not done  Mallory Shirk. MD Pgr [phone] 2:49 PM

## 2014-05-23 ENCOUNTER — Ambulatory Visit (INDEPENDENT_AMBULATORY_CARE_PROVIDER_SITE_OTHER): Payer: Medicare Other | Admitting: Urology

## 2014-05-23 ENCOUNTER — Other Ambulatory Visit: Payer: Self-pay | Admitting: Urology

## 2014-05-23 DIAGNOSIS — N281 Cyst of kidney, acquired: Secondary | ICD-10-CM | POA: Diagnosis not present

## 2014-05-23 DIAGNOSIS — N200 Calculus of kidney: Secondary | ICD-10-CM

## 2014-05-26 ENCOUNTER — Other Ambulatory Visit (INDEPENDENT_AMBULATORY_CARE_PROVIDER_SITE_OTHER): Payer: Medicare Other

## 2014-05-26 DIAGNOSIS — Z1212 Encounter for screening for malignant neoplasm of rectum: Secondary | ICD-10-CM

## 2014-05-26 DIAGNOSIS — R195 Other fecal abnormalities: Secondary | ICD-10-CM

## 2014-05-26 LAB — HEMOCCULT GUIAC POC 1CARD (OFFICE)
Card #2 Fecal Occult Blod, POC: NEGATIVE
FECAL OCCULT BLD: NEGATIVE
Fecal Occult Blood, POC: NEGATIVE

## 2014-05-26 NOTE — Progress Notes (Signed)
Pt had positive stool guaiac in office at annual exam, and was given 3 hem cards to take and bring back. Pt brought those back and all 3 were negative. Dr. Glo Herring checked them as well and said all 3 were negative. Pt notified of above.

## 2014-05-27 ENCOUNTER — Ambulatory Visit (HOSPITAL_COMMUNITY)
Admission: RE | Admit: 2014-05-27 | Discharge: 2014-05-27 | Disposition: A | Discharge status: Home / Self Care | Payer: Medicare Other | Source: Ambulatory Visit | Attending: Urology | Admitting: Urology

## 2014-05-27 DIAGNOSIS — Q619 Cystic kidney disease, unspecified: Secondary | ICD-10-CM | POA: Diagnosis not present

## 2014-05-27 DIAGNOSIS — N281 Cyst of kidney, acquired: Secondary | ICD-10-CM

## 2014-05-29 ENCOUNTER — Telehealth: Payer: Self-pay | Admitting: *Deleted

## 2014-05-29 DIAGNOSIS — J029 Acute pharyngitis, unspecified: Secondary | ICD-10-CM | POA: Diagnosis not present

## 2014-05-29 DIAGNOSIS — Z6836 Body mass index (BMI) 36.0-36.9, adult: Secondary | ICD-10-CM | POA: Diagnosis not present

## 2014-05-29 DIAGNOSIS — J069 Acute upper respiratory infection, unspecified: Secondary | ICD-10-CM | POA: Diagnosis not present

## 2014-05-29 NOTE — Telephone Encounter (Signed)
Pt informed hemoccult cards x 3. Pt to repeat in 3 months. Pt states will come by the office and get 3 cards and that time.

## 2014-06-10 ENCOUNTER — Other Ambulatory Visit (INDEPENDENT_AMBULATORY_CARE_PROVIDER_SITE_OTHER): Payer: Self-pay | Admitting: Internal Medicine

## 2014-06-10 DIAGNOSIS — K219 Gastro-esophageal reflux disease without esophagitis: Secondary | ICD-10-CM

## 2014-08-07 DIAGNOSIS — D235 Other benign neoplasm of skin of trunk: Secondary | ICD-10-CM | POA: Diagnosis not present

## 2014-08-07 DIAGNOSIS — L570 Actinic keratosis: Secondary | ICD-10-CM | POA: Diagnosis not present

## 2014-08-09 DIAGNOSIS — Z6836 Body mass index (BMI) 36.0-36.9, adult: Secondary | ICD-10-CM | POA: Diagnosis not present

## 2014-08-09 DIAGNOSIS — M7520 Bicipital tendinitis, unspecified shoulder: Secondary | ICD-10-CM | POA: Diagnosis not present

## 2014-09-19 DIAGNOSIS — M25569 Pain in unspecified knee: Secondary | ICD-10-CM | POA: Diagnosis not present

## 2014-09-29 ENCOUNTER — Encounter (INDEPENDENT_AMBULATORY_CARE_PROVIDER_SITE_OTHER): Payer: Self-pay | Admitting: Internal Medicine

## 2014-09-29 ENCOUNTER — Ambulatory Visit (INDEPENDENT_AMBULATORY_CARE_PROVIDER_SITE_OTHER): Payer: Medicare Other | Admitting: Internal Medicine

## 2014-09-29 VITALS — BP 126/78 | HR 80 | Temp 98.2°F | Ht 64.0 in | Wt 214.0 lb

## 2014-09-29 DIAGNOSIS — K219 Gastro-esophageal reflux disease without esophagitis: Secondary | ICD-10-CM | POA: Diagnosis not present

## 2014-09-29 DIAGNOSIS — Z8711 Personal history of peptic ulcer disease: Secondary | ICD-10-CM

## 2014-09-29 DIAGNOSIS — Z1159 Encounter for screening for other viral diseases: Secondary | ICD-10-CM | POA: Diagnosis not present

## 2014-09-29 NOTE — Patient Instructions (Signed)
Call if omeprazole stops working.

## 2014-09-29 NOTE — Progress Notes (Signed)
Presenting complaint;  Followup for GERD. History of peptic ulcer disease. Patient also interested in screening for HCV.  Subjective:  Patient is 68 year old Caucasian female who is here for scheduled visit. She was last seen in December 2014. She says she is doing well from GI standpoint. She has heartburn no more than once a week. She has taken few times which helps. She denies dysphagia except when she wakes in the morning with dry throat. She has good appetite. She has gained 8 pounds since her last visit. She states she's not been able to exercise on account of left knee pain. She is trying to watch calorie intake. She had 3 Hemoccults by Dr. Glo Herring in June 2015 and these were negative. She wants to be tested for hepatitis C.   Current Medications: Outpatient Encounter Prescriptions as of 09/29/2014  Medication Sig  . aspirin EC 81 MG tablet Take 81 mg by mouth every morning.  . Biotin 1000 MCG tablet Take 5,000 mcg by mouth every evening.   . Cholecalciferol (VITAMIN D) 2000 UNITS CAPS Take 1 capsule by mouth daily.    . diazepam (VALIUM) 5 MG tablet Take 5 mg by mouth every 8 (eight) hours as needed.   . ferrous sulfate 325 (65 FE) MG tablet Take 325 mg by mouth daily with breakfast.  . fluticasone (FLONASE) 50 MCG/ACT nasal spray Place 2 sprays into both nostrils as needed.   . gabapentin (NEURONTIN) 300 MG capsule Take 300 mg by mouth at bedtime.   . hydrochlorothiazide (HYDRODIURIL) 25 MG tablet Take 12.5 mg by mouth daily.  . montelukast (SINGULAIR) 10 MG tablet Take 10 mg by mouth every morning.   . nitroGLYCERIN (NITROSTAT) 0.4 MG SL tablet Place 1 tablet (0.4 mg total) under the tongue every 5 (five) minutes as needed for chest pain.  Marland Kitchen omeprazole (PRILOSEC) 20 MG capsule TAKE 1 CAPSULE BY MOUTH TWICE DAILY BEFORE A MEAL  . omeprazole (PRILOSEC) 20 MG capsule TAKE 1 CAPSULE BY MOUTH TWICE DAILY BEFORE A MEAL  . Probiotic Product (PROBIOTIC FORMULA) CAPS Take 1 capsule by  mouth daily.  . clindamycin (CLEOCIN) 300 MG capsule Take 300 mg by mouth 3 (three) times daily.    Objective: Blood pressure 126/78, pulse 80, temperature 98.2 F (36.8 C), height 5\' 4"  (1.626 m), weight 214 lb (97.07 kg). Patient is alert and in no acute distress. Conjunctiva is pink. Sclera is nonicteric Oropharyngeal mucosa is normal. No neck masses or thyromegaly noted. Cardiac exam with regular rhythm normal S1 and S2.  Faint SEM at LLSB. Lungs are clear to auscultation. Abdomen is full but soft and nontender without organomegaly or masses.  No LE edema or clubbing noted.    Assessment:  #1.GERD. She is doing well with therapy. Will consider dropping PPI dose next year. Last EGD was in December 2012 revealing moderate sized sliding-type hernia. #2. History of peptic ulcer disease secondary to NSAID therapy found on EGD of December 2012. H. pylori serology was negative. Patient is on low dose aspirin but does not take other NSAIDs. #3. She is up-to-date for CRC screening. Last colonoscopy was in October 2014 and was normal other than redundant colon.  Plan:  Patient will call if omeprazole stops working. She may consider dropping the dose to once a day in January next year. Office visit in one year.

## 2014-09-30 DIAGNOSIS — Z23 Encounter for immunization: Secondary | ICD-10-CM | POA: Diagnosis not present

## 2014-09-30 LAB — HEPATITIS C ANTIBODY: HCV AB: NEGATIVE

## 2014-10-15 DIAGNOSIS — J302 Other seasonal allergic rhinitis: Secondary | ICD-10-CM | POA: Diagnosis not present

## 2014-10-15 DIAGNOSIS — Z6836 Body mass index (BMI) 36.0-36.9, adult: Secondary | ICD-10-CM | POA: Diagnosis not present

## 2014-10-15 DIAGNOSIS — E6609 Other obesity due to excess calories: Secondary | ICD-10-CM | POA: Diagnosis not present

## 2014-10-15 DIAGNOSIS — J4530 Mild persistent asthma, uncomplicated: Secondary | ICD-10-CM | POA: Diagnosis not present

## 2014-10-20 DIAGNOSIS — R070 Pain in throat: Secondary | ICD-10-CM | POA: Diagnosis not present

## 2014-10-20 DIAGNOSIS — R490 Dysphonia: Secondary | ICD-10-CM | POA: Diagnosis not present

## 2014-11-13 ENCOUNTER — Other Ambulatory Visit (INDEPENDENT_AMBULATORY_CARE_PROVIDER_SITE_OTHER): Payer: Medicare Other

## 2014-11-13 DIAGNOSIS — Z01419 Encounter for gynecological examination (general) (routine) without abnormal findings: Secondary | ICD-10-CM

## 2014-11-13 DIAGNOSIS — R195 Other fecal abnormalities: Secondary | ICD-10-CM

## 2014-11-19 LAB — HEMOCCULT GUIAC POC 1CARD (OFFICE)
Card #3 Fecal Occult Blood, POC: POSITIVE
FECAL OCCULT BLD: POSITIVE
FECAL OCCULT BLD: POSITIVE

## 2014-11-19 NOTE — Progress Notes (Signed)
Late entry, pt dropped off stool cards to be read. Pt is aware that all three were positive. I will send this to Dr. Glo Herring and get him to follow up.

## 2014-11-24 ENCOUNTER — Telehealth: Payer: Self-pay | Admitting: *Deleted

## 2014-11-24 NOTE — Telephone Encounter (Signed)
-----   Message from Jonnie Kind, MD sent at 11/19/2014  6:05 PM EST ----- Positive hemoccult x 3, must be referred to Gastroenterology

## 2014-11-25 DIAGNOSIS — M25562 Pain in left knee: Secondary | ICD-10-CM | POA: Diagnosis not present

## 2014-11-25 NOTE — Telephone Encounter (Signed)
Pt aware of appointment with Terri at Dr. Olevia Perches office.

## 2014-11-26 ENCOUNTER — Ambulatory Visit (INDEPENDENT_AMBULATORY_CARE_PROVIDER_SITE_OTHER): Payer: Medicare Other | Admitting: Internal Medicine

## 2014-11-26 ENCOUNTER — Encounter (INDEPENDENT_AMBULATORY_CARE_PROVIDER_SITE_OTHER): Payer: Self-pay | Admitting: Internal Medicine

## 2014-11-26 VITALS — BP 156/82 | HR 72 | Temp 97.9°F | Ht 63.5 in | Wt 215.7 lb

## 2014-11-26 DIAGNOSIS — R195 Other fecal abnormalities: Secondary | ICD-10-CM | POA: Diagnosis not present

## 2014-11-26 NOTE — Patient Instructions (Addendum)
Three stool cards home with patient. OV in 3 months

## 2014-11-26 NOTE — Progress Notes (Signed)
Subjective:    Patient ID: [name], female    DOB: [birth year], 68 y.o.   MRN: [id number]  HPI Referred to our office for positive stool card.  In August 3 stool cards were negative. Three stool cards in November were all positive. Stools are brown in color. No change in her stools.  Appetite is good. No weight loss. No abdominal pain.  Usually has a BM daily. She takes a Probiotic daily.  No melena or BRRB  09/26/2013 Colonoscopy average risk:   Findings:  Prep excellent. Very tortuous colon. Slims cold used to complete exam. Normal mucosa throughout. Normal mucosa of rectum and anorectal junction except focal thickening of anoderm.  Impression:  Normal colonoscopy except tortuous colon.    CBC    Component Value Date/Time   WBC 8.0 11/15/2013 0525   RBC 4.25 11/15/2013 0525   HGB 12.5 11/15/2013 0525   HCT 38.2 11/15/2013 0525   PLT 221 11/15/2013 0525   MCV 89.9 11/15/2013 0525   MCH 29.4 11/15/2013 0525   MCHC 32.7 11/15/2013 0525   RDW 15.1 11/15/2013 0525   LYMPHSABS 0.8 05/21/2013 1851   MONOABS 0.6 05/21/2013 1851   EOSABS 0.0 05/21/2013 1851   BASOSABS 0.0 05/21/2013 1851        Review of Systems Past Medical History  Diagnosis Date  . Irregular heart beat   . Asthma   . GERD (gastroesophageal reflux disease)   . Arthritis   . Anginal pain     cardiac clearance on the chart-pt also has GI promblems  . Murmur      Since Birth followed by Dr Verl Blalock.  . H/O hiatal hernia     'Sliding"  . 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 in left ear.  Left  side of body weaker than right.  Carpal tunnel bil .  Marland Kitchen Carpal tunnel syndrome     bilateral- wears splints  . Ulcer     gastric  . Anemia     takes iron    Past Surgical History  Procedure Laterality Date  . Appendectomy    . Cholecystectomy    . Neck surgery      x 2   . Knee arthroscopy      Left  . Esophagogastroduodenoscopy  12/08/2011   Procedure: ESOPHAGOGASTRODUODENOSCOPY (EGD);  Surgeon: Rogene Houston, MD;  Location: [facility];  Service: Endoscopy;  Laterality: N/A;  3:00   . Colonoscopy    . Upper gastrointestinal endoscopy    . Hysterscopy    . Total knee arthroplasty  07/11/2012    Procedure: TOTAL KNEE ARTHROPLASTY;  Surgeon: Ninetta Lights, MD;  Location: [place];  Service: Orthopedics;  Laterality: Left;  left total knee arthroplasty  . Tonsillectomy    . Eye surgery Bilateral     cataract surgery  . Colonoscopy N/A 09/26/2013    Procedure: COLONOSCOPY;  Surgeon: Rogene Houston, MD;  [facility]: [facility];  Service: Endoscopy;  Laterality: N/A;  1030-rescheduled to 12:00pm Ann notified pt    Allergies  Allergen Reactions  . Nsaids Other (See Comments)    Caused Ulcers.  . Cephalexin     unknown  . Darvocet [Propoxyphene N-Acetaminophen]     nausea  . Percocet [Oxycodone-Acetaminophen] Nausea Only  . Penicillins Rash and Other (See Comments)    Redness    Current Outpatient Prescriptions on File Prior to Visit  Medication Sig Dispense Refill  .  aspirin EC 81 MG tablet Take 81 mg by mouth every morning.    . Biotin 1000 MCG tablet Take 5,000 mcg by mouth every evening.     . Cholecalciferol (VITAMIN D) 2000 UNITS CAPS Take 1 capsule by mouth daily.      . diazepam (VALIUM) 5 MG tablet Take 5 mg by mouth every 8 (eight) hours as needed.     . ferrous sulfate 325 (65 FE) MG tablet Take 325 mg by mouth daily with breakfast.    . fluticasone (FLONASE) 50 MCG/ACT nasal spray Place 2 sprays into both nostrils as needed.     . gabapentin (NEURONTIN) 300 MG capsule Take 300 mg by mouth at bedtime.     . hydrochlorothiazide (HYDRODIURIL) 25 MG tablet Take 12.5 mg by mouth daily.    . montelukast (SINGULAIR) 10 MG tablet Take 10 mg by mouth every morning.     . nitroGLYCERIN (NITROSTAT) 0.4 MG SL tablet Place 1 tablet (0.4 mg total) under the tongue every 5 (five) minutes as needed for chest pain. 30 tablet  12  . omeprazole (PRILOSEC) 20 MG capsule TAKE 1 CAPSULE BY MOUTH TWICE DAILY BEFORE A MEAL 60 capsule 11  . omeprazole (PRILOSEC) 20 MG capsule TAKE 1 CAPSULE BY MOUTH TWICE DAILY BEFORE A MEAL 60 capsule 11  . Probiotic Product (PROBIOTIC FORMULA) CAPS Take 1 capsule by mouth daily.     No current facility-administered medications on file prior to visit.        Objective:   Physical Exam  Filed Vitals:   11/26/14 1503  Height: 5' 3.5" (1.613 m)  Weight: 215 lb 11.2 oz (97.841 kg)   Alert and oriented. Skin warm and dry. Oral mucosa is moist.   . Sclera anicteric, conjunctivae is pink. Thyroid not enlarged. No cervical lymphadenopathy. Lungs clear. Heart regular rate and rhythm.  Abdomen is soft. Bowel sounds are positive. No hepatomegaly. No abdominal masses felt. No tenderness.  No edema to lower extremities. Stool brown and guaiac negative.        Assessment & Plan:  Guaiac positive stools by Dr. Glo Herring. Negative today. Recent colonoscopy in 2014 was normal. Possible hemorrhoidal bleed. Stool cards x 3 home with patient. OV in 3 months

## 2014-11-27 ENCOUNTER — Ambulatory Visit (INDEPENDENT_AMBULATORY_CARE_PROVIDER_SITE_OTHER): Payer: Medicare Other | Admitting: Otolaryngology

## 2014-11-27 ENCOUNTER — Ambulatory Visit (INDEPENDENT_AMBULATORY_CARE_PROVIDER_SITE_OTHER): Payer: Medicare Other | Admitting: Internal Medicine

## 2014-11-27 ENCOUNTER — Encounter (INDEPENDENT_AMBULATORY_CARE_PROVIDER_SITE_OTHER): Payer: Self-pay | Admitting: *Deleted

## 2014-11-27 DIAGNOSIS — R490 Dysphonia: Secondary | ICD-10-CM

## 2014-11-27 DIAGNOSIS — K219 Gastro-esophageal reflux disease without esophagitis: Secondary | ICD-10-CM

## 2014-12-02 DIAGNOSIS — J209 Acute bronchitis, unspecified: Secondary | ICD-10-CM | POA: Diagnosis not present

## 2014-12-02 DIAGNOSIS — E6609 Other obesity due to excess calories: Secondary | ICD-10-CM | POA: Diagnosis not present

## 2014-12-02 DIAGNOSIS — Z6836 Body mass index (BMI) 36.0-36.9, adult: Secondary | ICD-10-CM | POA: Diagnosis not present

## 2014-12-03 DIAGNOSIS — Z6836 Body mass index (BMI) 36.0-36.9, adult: Secondary | ICD-10-CM | POA: Diagnosis not present

## 2014-12-03 DIAGNOSIS — J180 Bronchopneumonia, unspecified organism: Secondary | ICD-10-CM | POA: Diagnosis not present

## 2014-12-10 ENCOUNTER — Telehealth (INDEPENDENT_AMBULATORY_CARE_PROVIDER_SITE_OTHER): Payer: Self-pay | Admitting: *Deleted

## 2014-12-10 NOTE — Telephone Encounter (Signed)
   Diagnosis:    Result(s)   Card 1:Negative:     Card 2: Negative:   Card 3: Negative:    Completed by: Pang Robers,LPN   HEMOCCULT SENSA DEVELOPER: LOT#:  [id number] EXPIRATION DATE: 2016-05   HEMOCCULT SENSA CARD:  LOT#: [id number]  EXPIRATION DATE: 04/16   CARD CONTROL RESULTS:  POSITIVE: Positive NEGATIVE: Negative    ADDITIONAL COMMENTS: This result was forwarded to Yoe for review and recommendation.

## 2014-12-25 ENCOUNTER — Ambulatory Visit (HOSPITAL_COMMUNITY)
Admission: RE | Admit: 2014-12-25 | Discharge: 2014-12-25 | Disposition: A | Discharge status: Home / Self Care | Payer: Medicare Other | Source: Ambulatory Visit | Attending: Family Medicine | Admitting: Family Medicine

## 2014-12-25 ENCOUNTER — Other Ambulatory Visit (HOSPITAL_COMMUNITY): Payer: Self-pay | Admitting: Family Medicine

## 2014-12-25 DIAGNOSIS — K449 Diaphragmatic hernia without obstruction or gangrene: Secondary | ICD-10-CM | POA: Diagnosis not present

## 2014-12-25 DIAGNOSIS — E6609 Other obesity due to excess calories: Secondary | ICD-10-CM | POA: Diagnosis not present

## 2014-12-25 DIAGNOSIS — R0989 Other specified symptoms and signs involving the circulatory and respiratory systems: Secondary | ICD-10-CM | POA: Insufficient documentation

## 2014-12-25 DIAGNOSIS — R509 Fever, unspecified: Secondary | ICD-10-CM | POA: Diagnosis not present

## 2014-12-25 DIAGNOSIS — R05 Cough: Secondary | ICD-10-CM | POA: Diagnosis not present

## 2014-12-25 DIAGNOSIS — J45909 Unspecified asthma, uncomplicated: Secondary | ICD-10-CM | POA: Diagnosis not present

## 2014-12-25 DIAGNOSIS — J4531 Mild persistent asthma with (acute) exacerbation: Secondary | ICD-10-CM | POA: Diagnosis not present

## 2014-12-25 DIAGNOSIS — Z6836 Body mass index (BMI) 36.0-36.9, adult: Secondary | ICD-10-CM | POA: Diagnosis not present

## 2014-12-25 DIAGNOSIS — R0689 Other abnormalities of breathing: Secondary | ICD-10-CM | POA: Diagnosis not present

## 2015-01-05 ENCOUNTER — Observation Stay (HOSPITAL_COMMUNITY)
Admission: EM | Admit: 2015-01-05 | Discharge: 2015-01-06 | Disposition: A | Discharge status: Home / Self Care | Payer: Medicare Other | Attending: Internal Medicine | Admitting: Internal Medicine

## 2015-01-05 ENCOUNTER — Encounter (HOSPITAL_COMMUNITY): Payer: Self-pay | Admitting: *Deleted

## 2015-01-05 ENCOUNTER — Emergency Department (HOSPITAL_COMMUNITY): Payer: Medicare Other

## 2015-01-05 DIAGNOSIS — G629 Polyneuropathy, unspecified: Secondary | ICD-10-CM

## 2015-01-05 DIAGNOSIS — I209 Angina pectoris, unspecified: Secondary | ICD-10-CM | POA: Diagnosis not present

## 2015-01-05 DIAGNOSIS — J4520 Mild intermittent asthma, uncomplicated: Secondary | ICD-10-CM | POA: Diagnosis not present

## 2015-01-05 DIAGNOSIS — J40 Bronchitis, not specified as acute or chronic: Secondary | ICD-10-CM | POA: Diagnosis not present

## 2015-01-05 DIAGNOSIS — G5600 Carpal tunnel syndrome, unspecified upper limb: Secondary | ICD-10-CM | POA: Insufficient documentation

## 2015-01-05 DIAGNOSIS — Z7952 Long term (current) use of systemic steroids: Secondary | ICD-10-CM | POA: Diagnosis not present

## 2015-01-05 DIAGNOSIS — I499 Cardiac arrhythmia, unspecified: Secondary | ICD-10-CM | POA: Diagnosis not present

## 2015-01-05 DIAGNOSIS — R079 Chest pain, unspecified: Principal | ICD-10-CM | POA: Diagnosis present

## 2015-01-05 DIAGNOSIS — K219 Gastro-esophageal reflux disease without esophagitis: Secondary | ICD-10-CM | POA: Diagnosis not present

## 2015-01-05 DIAGNOSIS — K210 Gastro-esophageal reflux disease with esophagitis: Secondary | ICD-10-CM

## 2015-01-05 DIAGNOSIS — D649 Anemia, unspecified: Secondary | ICD-10-CM | POA: Diagnosis present

## 2015-01-05 DIAGNOSIS — E876 Hypokalemia: Secondary | ICD-10-CM | POA: Diagnosis present

## 2015-01-05 DIAGNOSIS — F419 Anxiety disorder, unspecified: Secondary | ICD-10-CM | POA: Insufficient documentation

## 2015-01-05 DIAGNOSIS — M1990 Unspecified osteoarthritis, unspecified site: Secondary | ICD-10-CM | POA: Diagnosis not present

## 2015-01-05 DIAGNOSIS — I10 Essential (primary) hypertension: Secondary | ICD-10-CM | POA: Diagnosis not present

## 2015-01-05 DIAGNOSIS — Z7982 Long term (current) use of aspirin: Secondary | ICD-10-CM | POA: Diagnosis not present

## 2015-01-05 DIAGNOSIS — G709 Myoneural disorder, unspecified: Secondary | ICD-10-CM | POA: Insufficient documentation

## 2015-01-05 DIAGNOSIS — Z79899 Other long term (current) drug therapy: Secondary | ICD-10-CM | POA: Diagnosis not present

## 2015-01-05 DIAGNOSIS — M25562 Pain in left knee: Secondary | ICD-10-CM | POA: Diagnosis present

## 2015-01-05 DIAGNOSIS — Z880 Allergy status to penicillin: Secondary | ICD-10-CM | POA: Diagnosis not present

## 2015-01-05 DIAGNOSIS — R011 Cardiac murmur, unspecified: Secondary | ICD-10-CM | POA: Insufficient documentation

## 2015-01-05 DIAGNOSIS — Z872 Personal history of diseases of the skin and subcutaneous tissue: Secondary | ICD-10-CM | POA: Diagnosis not present

## 2015-01-05 DIAGNOSIS — J45909 Unspecified asthma, uncomplicated: Secondary | ICD-10-CM | POA: Diagnosis not present

## 2015-01-05 DIAGNOSIS — K279 Peptic ulcer, site unspecified, unspecified as acute or chronic, without hemorrhage or perforation: Secondary | ICD-10-CM | POA: Diagnosis present

## 2015-01-05 HISTORY — DX: Anxiety disorder, unspecified: F41.9

## 2015-01-05 LAB — CBC WITH DIFFERENTIAL/PLATELET
BASOS PCT: 0 % (ref 0–1)
Basophils Absolute: 0.1 10*3/uL (ref 0.0–0.1)
EOS ABS: 0.4 10*3/uL (ref 0.0–0.7)
EOS PCT: 3 % (ref 0–5)
HCT: 41.9 % (ref 36.0–46.0)
Hemoglobin: 13.7 g/dL (ref 12.0–15.0)
LYMPHS ABS: 3.2 10*3/uL (ref 0.7–4.0)
Lymphocytes Relative: 22 % (ref 12–46)
MCH: 29.9 pg (ref 26.0–34.0)
MCHC: 32.7 g/dL (ref 30.0–36.0)
MCV: 91.5 fL (ref 78.0–100.0)
Monocytes Absolute: 1 10*3/uL (ref 0.1–1.0)
Monocytes Relative: 7 % (ref 3–12)
NEUTROS ABS: 9.7 10*3/uL — AB (ref 1.7–7.7)
Neutrophils Relative %: 68 % (ref 43–77)
Platelets: 233 10*3/uL (ref 150–400)
RBC: 4.58 MIL/uL (ref 3.87–5.11)
RDW: 15 % (ref 11.5–15.5)
WBC: 14.3 10*3/uL — AB (ref 4.0–10.5)

## 2015-01-05 LAB — TROPONIN I: Troponin I: <0.03 ng/mL (ref <0.031)

## 2015-01-05 LAB — COMPREHENSIVE METABOLIC PANEL
ALT: 28 U/L (ref 0–35)
ANION GAP: 7 (ref 5–15)
AST: 15 U/L (ref 0–37)
Albumin: 3.9 g/dL (ref 3.5–5.2)
Alkaline Phosphatase: 69 U/L (ref 39–117)
BILIRUBIN TOTAL: 0.6 mg/dL (ref 0.3–1.2)
BUN: 31 mg/dL — ABNORMAL HIGH (ref 6–23)
CO2: 28 mmol/L (ref 19–32)
CREATININE: 1 mg/dL (ref 0.50–1.10)
Calcium: 8.7 mg/dL (ref 8.4–10.5)
Chloride: 105 mEq/L (ref 96–112)
GFR calc Af Amer: 66 mL/min — ABNORMAL LOW (ref >90)
GFR calc non Af Amer: 57 mL/min — ABNORMAL LOW (ref >90)
Glucose, Bld: 103 mg/dL — ABNORMAL HIGH (ref 70–99)
Potassium: 3.2 mmol/L — ABNORMAL LOW (ref 3.5–5.1)
SODIUM: 140 mmol/L (ref 135–145)
TOTAL PROTEIN: 6.4 g/dL (ref 6.0–8.3)

## 2015-01-05 LAB — D-DIMER, QUANTITATIVE: D-Dimer, Quant: 0.35 ug/mL-FEU (ref 0.00–0.48)

## 2015-01-05 LAB — CBG MONITORING, ED: Glucose-Capillary: 139 mg/dL — ABNORMAL HIGH (ref 70–99)

## 2015-01-05 MED ORDER — HEPARIN SODIUM (PORCINE) 5000 UNIT/ML IJ SOLN
5000.0000 [IU] | Freq: Three times a day (TID) | INTRAMUSCULAR | Status: DC
  Administered 2015-01-05 – 2015-01-06 (×2): 5000 [IU] via SUBCUTANEOUS
  Filled 2015-01-05 (×3): qty 1

## 2015-01-05 MED ORDER — MONTELUKAST SODIUM 10 MG PO TABS
10.0000 mg | ORAL_TABLET | Freq: Every morning | ORAL | Status: DC
  Administered 2015-01-06: 10 mg via ORAL
  Filled 2015-01-05: qty 1

## 2015-01-05 MED ORDER — DIAZEPAM 5 MG PO TABS: 5.0000 mg | ORAL_TABLET | Freq: Three times a day (TID) | ORAL | Status: DC | PRN

## 2015-01-05 MED ORDER — NITROGLYCERIN 2 % TD OINT
1.0000 [in_us] | TOPICAL_OINTMENT | Freq: Four times a day (QID) | TRANSDERMAL | Status: DC
  Administered 2015-01-05 – 2015-01-06 (×3): 1 [in_us] via TOPICAL
  Filled 2015-01-05 (×3): qty 1

## 2015-01-05 MED ORDER — ONDANSETRON HCL 4 MG/2ML IJ SOLN
4.0000 mg | Freq: Once | INTRAMUSCULAR | Status: AC
  Administered 2015-01-05: 4 mg via INTRAVENOUS
  Filled 2015-01-05: qty 2

## 2015-01-05 MED ORDER — HYDROCHLOROTHIAZIDE 25 MG PO TABS
12.5000 mg | ORAL_TABLET | Freq: Every day | ORAL | Status: DC
  Administered 2015-01-06: 12.5 mg via ORAL
  Filled 2015-01-05: qty 1
  Filled 2015-01-05 (×2): qty 0.5

## 2015-01-05 MED ORDER — ASPIRIN EC 81 MG PO TBEC
81.0000 mg | DELAYED_RELEASE_TABLET | Freq: Every morning | ORAL | Status: DC
  Administered 2015-01-06: 81 mg via ORAL
  Filled 2015-01-05: qty 1

## 2015-01-05 MED ORDER — ONDANSETRON HCL 4 MG/2ML IJ SOLN: 4.0000 mg | Freq: Four times a day (QID) | INTRAMUSCULAR | Status: DC | PRN

## 2015-01-05 MED ORDER — PREDNISONE 10 MG PO TABS
10.0000 mg | ORAL_TABLET | Freq: Every day | ORAL | Status: DC
  Administered 2015-01-06: 10 mg via ORAL
  Filled 2015-01-05: qty 1

## 2015-01-05 MED ORDER — MORPHINE SULFATE 4 MG/ML IJ SOLN
4.0000 mg | Freq: Once | INTRAMUSCULAR | Status: AC
  Administered 2015-01-05: 4 mg via INTRAVENOUS
  Filled 2015-01-05: qty 1

## 2015-01-05 MED ORDER — FERROUS SULFATE 325 (65 FE) MG PO TABS
325.0000 mg | ORAL_TABLET | Freq: Every day | ORAL | Status: DC
  Administered 2015-01-06: 325 mg via ORAL
  Filled 2015-01-05 (×3): qty 1

## 2015-01-05 MED ORDER — BIOTIN 1000 MCG PO TABS: 5000.0000 ug | ORAL_TABLET | Freq: Every evening | ORAL | Status: DC

## 2015-01-05 MED ORDER — ACETAMINOPHEN 325 MG PO TABS
650.0000 mg | ORAL_TABLET | ORAL | Status: DC | PRN
  Administered 2015-01-05 – 2015-01-06 (×2): 650 mg via ORAL
  Filled 2015-01-05 (×2): qty 2

## 2015-01-05 MED ORDER — PANTOPRAZOLE SODIUM 40 MG PO TBEC
40.0000 mg | DELAYED_RELEASE_TABLET | Freq: Every day | ORAL | Status: DC
  Administered 2015-01-05 – 2015-01-06 (×2): 40 mg via ORAL
  Filled 2015-01-05 (×2): qty 1

## 2015-01-05 MED ORDER — GI COCKTAIL ~~LOC~~: 30.0000 mL | Freq: Four times a day (QID) | ORAL | Status: DC | PRN

## 2015-01-05 MED ORDER — MORPHINE SULFATE 2 MG/ML IJ SOLN: 2.0000 mg | INTRAMUSCULAR | Status: DC | PRN

## 2015-01-05 MED ORDER — GABAPENTIN 300 MG PO CAPS
300.0000 mg | ORAL_CAPSULE | Freq: Every day | ORAL | Status: DC
  Administered 2015-01-05: 300 mg via ORAL
  Filled 2015-01-05: qty 1

## 2015-01-05 NOTE — ED Notes (Signed)
Chest pain ,onset this am , hx of reflux.  Had episode of dizzinesss.  No n/v

## 2015-01-05 NOTE — ED Provider Notes (Signed)
CSN: [id number]     Arrival date & time 01/05/15  1303 History  This chart was scribed for Dorie Rank, MD by Zola Button, ED Scribe. This patient was seen in room APA11/APA11 and the patient's care was started at 3:43 PM.      Chief Complaint  Patient presents with  . Chest Pain   The history is provided by the patient. No language interpreter was used.    HPI Comments: NOVIA LANSBERRY is a 69 y.o. female with a hx of GERD who presents to the Emergency Department complaining of severe, sharp upper mid chest pain radiating to her throat that started around 1:00 PM today while buying stamps at the post office. Patient also reports having dizziness this morning. She states her pain has improved slightly since onset and she describes the pain as an 8/10 in severity. The pain is described as a feeling as if "a balloon popped." Patient has had heart testing at [facility] when she last had chest pain. Her stress test was done a few years ago; she could not get on the treadmill, so she had a nuclear medicine stress test. She states that she has not had chest pain this severe before. Patient denies SOB, nausea and leg swelling. She denies hx of MI, blood clots and FMHx of cardiac problems. Patient notes that she had bad bronchitis last month. She has a PSHx of left knee arthroscopy.   Past Medical History  Diagnosis Date  . Irregular heart beat   . Asthma   . GERD (gastroesophageal reflux disease)   . Arthritis   . Anginal pain     cardiac clearance on the chart-pt also has GI promblems  . Murmur      Since Birth followed by Dr Verl Blalock.  . H/O hiatal hernia     'Sliding"  . 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 in left ear.  Left  side of body weaker than right.  Carpal tunnel bil .  Marland Kitchen Carpal tunnel syndrome     bilateral- wears splints  . Ulcer     gastric  . Anemia     takes iron   Past Surgical History  Procedure Laterality Date  . Appendectomy    .  Cholecystectomy    . Neck surgery      x 2   . Knee arthroscopy      Left  . Esophagogastroduodenoscopy  12/08/2011    Procedure: ESOPHAGOGASTRODUODENOSCOPY (EGD);  Surgeon: Rogene Houston, MD;  [facility];  Service: Endoscopy;  Laterality: N/A;  3:00   . Colonoscopy    . Upper gastrointestinal endoscopy    . Hysterscopy    . Total knee arthroplasty  07/11/2012    Procedure: TOTAL KNEE ARTHROPLASTY;  Surgeon: Ninetta Lights, MD;  Location: [place];  Service: Orthopedics;  Laterality: Left;  left total knee arthroplasty  . Tonsillectomy    . Eye surgery Bilateral     cataract surgery  . Colonoscopy N/A 09/26/2013    Procedure: COLONOSCOPY;  Surgeon: Rogene Houston, MD;  [facility];  Service: Endoscopy;  Laterality: N/A;  1030-rescheduled to 12:00pm Ann notified pt   Family History  Problem Relation Age of Onset  . Colon cancer Neg Hx   . Hypertension Son   . Alzheimer's disease Mother   . Kidney disease Mother   . Kidney disease Father   . Cancer Maternal Aunt  leukemia  . Liver disease Maternal Uncle   . Kidney disease Paternal Uncle     kidney removed  . Diabetes Maternal Grandmother   . Diabetes Maternal Grandfather   . Cancer Paternal Grandmother     breast, liver   History  Substance Use Topics  . Smoking status: Never Smoker   . Smokeless tobacco: Never Used  . Alcohol Use: No   OB History    No data available     Review of Systems  Respiratory: Negative for shortness of breath.   Cardiovascular: Positive for chest pain. Negative for leg swelling.  Gastrointestinal: Negative for nausea.  Neurological: Positive for dizziness.  All other systems reviewed and are negative.     Allergies  Nsaids; Cephalexin; Darvocet; Percocet; and Penicillins  Home Medications   Prior to Admission medications   Medication Sig Start Date End Date Taking? Authorizing Provider  aspirin EC 81 MG tablet Take 81 mg by mouth every morning.   Yes  Historical Provider, MD  Biotin 1000 MCG tablet Take 5,000 mcg by mouth every evening.    Yes Historical Provider, MD  Cholecalciferol (VITAMIN D) 2000 UNITS CAPS Take 1 capsule by mouth daily.     Yes Historical Provider, MD  diazepam (VALIUM) 5 MG tablet Take 5 mg by mouth every 8 (eight) hours as needed for anxiety.    Yes Historical Provider, MD  ferrous sulfate 325 (65 FE) MG tablet Take 325 mg by mouth daily with breakfast.   Yes Historical Provider, MD  gabapentin (NEURONTIN) 300 MG capsule Take 300 mg by mouth at bedtime.    Yes Historical Provider, MD  hydrochlorothiazide (HYDRODIURIL) 25 MG tablet Take 12.5 mg by mouth daily.   Yes Historical Provider, MD  montelukast (SINGULAIR) 10 MG tablet Take 10 mg by mouth every morning.    Yes Historical Provider, MD  nitroGLYCERIN (NITROSTAT) 0.4 MG SL tablet Place 1 tablet (0.4 mg total) under the tongue every 5 (five) minutes as needed for chest pain. 11/15/13  Yes Ripudeep Krystal Eaton, MD  omeprazole (PRILOSEC) 20 MG capsule TAKE 1 CAPSULE BY MOUTH TWICE DAILY BEFORE A MEAL 04/29/14  Yes Butch Penny, NP  predniSONE (DELTASONE) 10 MG tablet Take 20 mg by mouth daily. 12/25/14  Yes Historical Provider, MD  Probiotic Product (PROBIOTIC FORMULA) CAPS Take 1 capsule by mouth daily.   Yes Historical Provider, MD  omeprazole (PRILOSEC) 20 MG capsule TAKE 1 CAPSULE BY MOUTH TWICE DAILY BEFORE A MEAL Patient not taking: Reported on 01/05/2015 06/10/14   Butch Penny, NP   BP 151/85 mmHg  Pulse 92  Temp(Src) 97.8 F (36.6 C) (Oral)  Resp 18  Ht 5\' 4"  (1.626 m)  Wt 207 lb (93.895 kg)  BMI 35.51 kg/m2  SpO2 100% Physical Exam  Constitutional: She appears well-developed and well-nourished. No distress.  HENT:  Head: Normocephalic and atraumatic.  Right Ear: External ear normal.  Left Ear: External ear normal.  Eyes: Conjunctivae are normal. Right eye exhibits no discharge. Left eye exhibits no discharge. No scleral icterus.  Neck: Neck supple. No  tracheal deviation present.  Cardiovascular: Normal rate, regular rhythm and intact distal pulses.   Pulmonary/Chest: Effort normal and breath sounds normal. No stridor. No respiratory distress. She has no wheezes. She has no rales. She exhibits no tenderness.  Abdominal: Soft. Bowel sounds are normal. She exhibits no distension. There is no tenderness. There is no rebound and no guarding.  Musculoskeletal: She exhibits edema. She exhibits no tenderness.  Left lower extremity greater than right.  Neurological: She is alert. She has normal strength. No cranial nerve deficit (no facial droop, extraocular movements intact, no slurred speech) or sensory deficit. She exhibits normal muscle tone. She displays no seizure activity. Coordination normal.  Skin: Skin is warm and dry. No rash noted.  Psychiatric: She has a normal mood and affect.  Nursing note and vitals reviewed.   ED Course  Procedures  DIAGNOSTIC STUDIES: Oxygen Saturation is 100% on room air, normal by my interpretation.    COORDINATION OF CARE: 3:51 PM-Discussed treatment plan which includes medications, admission, labs, and CXR with pt at bedside and pt agreed to plan.    Labs Review Labs Reviewed  CBC WITH DIFFERENTIAL - Abnormal; Notable for the following:    WBC 14.3 (*)    Neutro Abs 9.7 (*)    All other components within normal limits  COMPREHENSIVE METABOLIC PANEL - Abnormal; Notable for the following:    Potassium 3.2 (*)    Glucose, Bld 103 (*)    BUN 31 (*)    GFR calc non Af Amer 57 (*)    GFR calc Af Amer 66 (*)    All other components within normal limits  TROPONIN I  D-DIMER, QUANTITATIVE    Imaging Review Dg Chest 2 View  01/05/2015   CLINICAL DATA:  Chest pain since this morning.  EXAM: CHEST  2 VIEW  COMPARISON:  12/25/2014  FINDINGS: Moderate-sized hiatal hernia. Heart is normal size. Probable calcified granuloma peripherally in the left lung base. Lungs otherwise clear. No effusions. No acute bony  abnormality.  IMPRESSION: No active cardiopulmonary disease.   Electronically Signed   By: Rolm Baptise M.D.   On: 01/05/2015 13:39     EKG Interpretation   Date/Time:  Monday January 05 2015 13:12:17 EST Ventricular Rate:  96 PR Interval:  196 QRS Duration: 78 QT Interval:  348 QTC Calculation: 439 R Axis:   28 Text Interpretation:  Normal sinus rhythm Low voltage QRS Cannot rule out  Anterior infarct , age undetermined Abnormal ECG No significant change  since last tracing Confirmed by Bettie Swavely  MD-J, Zennie Ayars ([id number]) on 01/05/2015  3:44:58 PM     Medications  nitroGLYCERIN (NITROGLYN) 2 % ointment 1 inch (not administered)  morphine 4 MG/ML injection 4 mg (not administered)  ondansetron (ZOFRAN) injection 4 mg (not administered)    MDM   Final diagnoses:  Chest pain   Symptoms concerning for angina.  Heart score of 3.  NTG ordered.  Pt took asa prior to arrival.  Admit for osbervaiton.  I personally performed the services described in this documentation, which was scribed in my presence.  The recorded information has been reviewed and is accurate.    Dorie Rank, MD 01/05/15 1620

## 2015-01-05 NOTE — H&P (Signed)
[facility] History and Physical  [name] KHT:[id number] DOB: 1946/09/30 DOA: 01/05/2015  Referring physician: Dr. Tomi Bamberger PCP: Purvis Kilts, MD   Chief Complaint: Chest pain  HPI: [name] is a 69 y.o. female  Chest pain. Started at 13:00 today while in a store. Sharp and mid chest. Radiation to neck. Feel like a "balloon popped." CP in the past but this is much worse than ever before.  Improved w/ rest. Denies exacerbating factors.  Associated w/ dizziness and hot sensation. Denies SOB, Paplitations, nausea.  Pain still present but much improved.  Does not change w/ deep breathing or change in body positions.   Stress test done a few years ago was nml per pt.    Review of Systems:  Constitutional:  No weight loss, night sweats, Fevers, chills, fatigue.  HEENT:  No headaches, Difficulty swallowing,Tooth/dental problems,Sore throat,  No sneezing, itching, ear ache, nasal congestion, post nasal drip,  Cardio-vascular:  Per HPI GI:  No heartburn, indigestion, abdominal pain, nausea, vomiting, diarrhea, change in bowel habits, loss of appetite  Resp:  No shortness of breath with exertion or at rest. No excess mucus, no productive cough, No non-productive cough, No coughing up of blood.No change in color of mucus.No wheezing.No chest wall deformity  Skin:  no rash or lesions.  GU:  no dysuria, change in color of urine, no urgency or frequency. No flank pain.  Musculoskeletal:  No joint pain or swelling. No decreased range of motion. No back pain.  Psych:  No change in mood or affect. No depression or anxiety. No memory loss.   Past Medical History  Diagnosis Date  . Irregular heart beat   . Asthma   . GERD (gastroesophageal reflux disease)   . Arthritis   . Anginal pain     cardiac clearance on the chart-pt also has GI promblems  . Murmur      Since Birth followed by Dr Verl Blalock.  . H/O hiatal hernia     'Sliding"  . 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 in left ear.  Left  side of body weaker than right.  Carpal tunnel bil .  Marland Kitchen Carpal tunnel syndrome     bilateral- wears splints  . Ulcer     gastric  . Anemia     takes iron   Past Surgical History  Procedure Laterality Date  . Appendectomy    . Cholecystectomy    . Neck surgery      x 2   . Knee arthroscopy      Left  . Esophagogastroduodenoscopy  12/08/2011    Procedure: ESOPHAGOGASTRODUODENOSCOPY (EGD);  Surgeon: Rogene Houston, MD;  [facility];  Service: Endoscopy;  Laterality: N/A;  3:00   . Colonoscopy    . Upper gastrointestinal endoscopy    . Hysterscopy    . Total knee arthroplasty  07/11/2012    Procedure: TOTAL KNEE ARTHROPLASTY;  Surgeon: Ninetta Lights, MD;  Location: [place];  Service: Orthopedics;  Laterality: Left;  left total knee arthroplasty  . Tonsillectomy    . Eye surgery Bilateral     cataract surgery  . Colonoscopy N/A 09/26/2013    Procedure: COLONOSCOPY;  Surgeon: Rogene Houston, MD;  [facility];  Service: Endoscopy;  Laterality: N/A;  1030-rescheduled to 12:00pm Ann notified pt   Social History:  reports that she has never smoked. She has never used smokeless tobacco. She reports that she  does not drink alcohol or use illicit drugs.  Allergies  Allergen Reactions  . Nsaids Other (See Comments)    Caused Ulcers.  . Cephalexin     unknown  . Darvocet [Propoxyphene N-Acetaminophen]     nausea  . Percocet [Oxycodone-Acetaminophen] Nausea Only  . Penicillins Rash and Other (See Comments)    Redness    Family History  Problem Relation Age of Onset  . Colon cancer Neg Hx   . Hypertension Son   . Alzheimer's disease Mother   . Kidney disease Mother   . Kidney disease Father   . Cancer Maternal Aunt     leukemia  . Liver disease Maternal Uncle   . Kidney disease Paternal Uncle     kidney removed  . Diabetes Maternal Grandmother   . Diabetes Maternal Grandfather   .  Cancer Paternal Grandmother     breast, liver     Prior to Admission medications   Medication Sig Start Date End Date Taking? Authorizing Provider  aspirin EC 81 MG tablet Take 81 mg by mouth every morning.   Yes Historical Provider, MD  Biotin 1000 MCG tablet Take 5,000 mcg by mouth every evening.    Yes Historical Provider, MD  Cholecalciferol (VITAMIN D) 2000 UNITS CAPS Take 1 capsule by mouth daily.     Yes Historical Provider, MD  diazepam (VALIUM) 5 MG tablet Take 5 mg by mouth every 8 (eight) hours as needed for anxiety.    Yes Historical Provider, MD  ferrous sulfate 325 (65 FE) MG tablet Take 325 mg by mouth daily with breakfast.   Yes Historical Provider, MD  gabapentin (NEURONTIN) 300 MG capsule Take 300 mg by mouth at bedtime.    Yes Historical Provider, MD  hydrochlorothiazide (HYDRODIURIL) 25 MG tablet Take 12.5 mg by mouth daily.   Yes Historical Provider, MD  montelukast (SINGULAIR) 10 MG tablet Take 10 mg by mouth every morning.    Yes Historical Provider, MD  nitroGLYCERIN (NITROSTAT) 0.4 MG SL tablet Place 1 tablet (0.4 mg total) under the tongue every 5 (five) minutes as needed for chest pain. 11/15/13  Yes Ripudeep Krystal Eaton, MD  omeprazole (PRILOSEC) 20 MG capsule TAKE 1 CAPSULE BY MOUTH TWICE DAILY BEFORE A MEAL 04/29/14  Yes Butch Penny, NP  predniSONE (DELTASONE) 10 MG tablet Take 20 mg by mouth daily. 12/25/14  Yes Historical Provider, MD  Probiotic Product (PROBIOTIC FORMULA) CAPS Take 1 capsule by mouth daily.   Yes Historical Provider, MD  omeprazole (PRILOSEC) 20 MG capsule TAKE 1 CAPSULE BY MOUTH TWICE DAILY BEFORE A MEAL Patient not taking: Reported on 01/05/2015 06/10/14   Butch Penny, NP   Physical Exam: Filed Vitals:   01/05/15 1600 01/05/15 1730 01/05/15 1800 01/05/15 1818  BP: 152/76 146/65 120/59 115/60  Pulse: 73 85 64 63  Temp:      TempSrc:      Resp: 15 18 16 18   Height:      Weight:      SpO2: 99% 96% 91% 97%    Wt Readings from Last 3  Encounters:  01/05/15 93.895 kg (207 lb)  11/26/14 97.841 kg (215 lb 11.2 oz)  09/29/14 97.07 kg (214 lb)    General:  Appears calm and comfortable Eyes:  PERRL, normal lids, irises & conjunctiva ENT:  grossly normal hearing, lips & tongue Neck:  no LAD, masses or thyromegaly Cardiovascular: II/VI systolic murmur. RRR. Trace LE edema. Telemetry:  SR, no arrhythmias  Respiratory:  CTA bilaterally, no w/r/r. Normal respiratory effort. Abdomen:  soft, ntnd Skin:  no rash or induration seen on limited exam Musculoskeletal:  grossly normal tone BUE/BLE Psychiatric:  grossly normal mood and affect, speech fluent and appropriate Neurologic:  grossly non-focal.          Labs on Admission:  Basic Metabolic Panel:  Recent Labs Lab 01/05/15 1352  NA 140  K 3.2*  CL 105  CO2 28  GLUCOSE 103*  BUN 31*  CREATININE 1.00  CALCIUM 8.7   Liver Function Tests:  Recent Labs Lab 01/05/15 1352  AST 15  ALT 28  ALKPHOS 69  BILITOT 0.6  PROT 6.4  ALBUMIN 3.9   No results for input(s): LIPASE, AMYLASE in the last 168 hours. No results for input(s): AMMONIA in the last 168 hours. CBC:  Recent Labs Lab 01/05/15 1352  WBC 14.3*  NEUTROABS 9.7*  HGB 13.7  HCT 41.9  MCV 91.5  PLT 233   Cardiac Enzymes:  Recent Labs Lab 01/05/15 1352  TROPONINI <0.03    BNP (last 3 results) No results for input(s): PROBNP in the last 8760 hours. CBG: No results for input(s): GLUCAP in the last 168 hours.  Radiological Exams on Admission: Dg Chest 2 View  01/05/2015   CLINICAL DATA:  Chest pain since this morning.  EXAM: CHEST  2 VIEW  COMPARISON:  12/25/2014  FINDINGS: Moderate-sized hiatal hernia. Heart is normal size. Probable calcified granuloma peripherally in the left lung base. Lungs otherwise clear. No effusions. No acute bony abnormality.  IMPRESSION: No active cardiopulmonary disease.   Electronically Signed   By: Rolm Baptise M.D.   On: 01/05/2015 13:39    EKG: Independently  reviewed. NSR, low voltage. No sign of ACS, no significant change from previous.   Assessment/Plan Principal Problem:   Chest pain Active Problems:   Essential hypertension   Asthma   GERD   PUD (peptic ulcer disease)   Anemia   Neuropathy  CP: Cardiac vs GERD vs Anxiety . Unlikely MSK or pleuritic. Risk factors include age and HTN. Symptoms improved w/ rest and were associated w/ radiation and dizziness. EKG nml. Troponin neg.  - Admit - Tele - cycle troponins - GI cocktail - continue home ASA - Lipid panel in the am  Bronchitis: Dx by primary doctopr on 12/31. Given steroid taper w/ improvement. Now on Prednisone 20mg . H/o Asthma - COntinue wean as follows - 20mg  Qday x 2 days and 10mg  Q 2 days then DC.  - continue Singulair  HTN: normotensive - continue HCTZ,  Anxiety:  - continue home Valium  GERD:  Continue PPI  Neuropathy:  - continue home gabapentin  Anemia: Hgb 13.7 and normocytic. No current anemia - continue home Iron    Code Status: FULL DVT Prophylaxis: Heparin Family Communication: Husband (TIM) and son Research scientist (life sciences)) Disposition Plan: Pending r/o    [place], [facility] [email or web address] Password [email or web address]

## 2015-01-06 ENCOUNTER — Encounter (HOSPITAL_COMMUNITY): Payer: Self-pay | Admitting: Internal Medicine

## 2015-01-06 DIAGNOSIS — G629 Polyneuropathy, unspecified: Secondary | ICD-10-CM | POA: Diagnosis not present

## 2015-01-06 DIAGNOSIS — R079 Chest pain, unspecified: Secondary | ICD-10-CM | POA: Diagnosis not present

## 2015-01-06 DIAGNOSIS — K219 Gastro-esophageal reflux disease without esophagitis: Secondary | ICD-10-CM

## 2015-01-06 DIAGNOSIS — I10 Essential (primary) hypertension: Secondary | ICD-10-CM | POA: Diagnosis not present

## 2015-01-06 LAB — COMPREHENSIVE METABOLIC PANEL
ALT: 24 U/L (ref 0–35)
AST: 14 U/L (ref 0–37)
Albumin: 3.4 g/dL — ABNORMAL LOW (ref 3.5–5.2)
Alkaline Phosphatase: 60 U/L (ref 39–117)
Anion gap: 7 (ref 5–15)
BILIRUBIN TOTAL: 0.7 mg/dL (ref 0.3–1.2)
BUN: 23 mg/dL (ref 6–23)
CO2: 28 mmol/L (ref 19–32)
Calcium: 8.5 mg/dL (ref 8.4–10.5)
Chloride: 104 mEq/L (ref 96–112)
Creatinine, Ser: 0.8 mg/dL (ref 0.50–1.10)
GFR calc Af Amer: 86 mL/min — ABNORMAL LOW (ref >90)
GFR calc non Af Amer: 74 mL/min — ABNORMAL LOW (ref >90)
Glucose, Bld: 111 mg/dL — ABNORMAL HIGH (ref 70–99)
Potassium: 3.4 mmol/L — ABNORMAL LOW (ref 3.5–5.1)
Sodium: 139 mmol/L (ref 135–145)
Total Protein: 5.6 g/dL — ABNORMAL LOW (ref 6.0–8.3)

## 2015-01-06 LAB — LIPID PANEL
Cholesterol: 108 mg/dL (ref 0–200)
HDL: 54 mg/dL (ref >39)
LDL CALC: 42 mg/dL (ref 0–99)
Total CHOL/HDL Ratio: 2 RATIO
Triglycerides: 61 mg/dL (ref <150)
VLDL: 12 mg/dL (ref 0–40)

## 2015-01-06 LAB — CBC
HEMATOCRIT: 37.8 % (ref 36.0–46.0)
HEMOGLOBIN: 12.1 g/dL (ref 12.0–15.0)
MCH: 29.5 pg (ref 26.0–34.0)
MCHC: 32 g/dL (ref 30.0–36.0)
MCV: 92.2 fL (ref 78.0–100.0)
Platelets: 216 10*3/uL (ref 150–400)
RBC: 4.1 MIL/uL (ref 3.87–5.11)
RDW: 15 % (ref 11.5–15.5)
WBC: 9.9 10*3/uL (ref 4.0–10.5)

## 2015-01-06 LAB — TROPONIN I

## 2015-01-06 LAB — HEMOGLOBIN A1C
HEMOGLOBIN A1C: 6.6 % — AB (ref <5.7)
Mean Plasma Glucose: 143 mg/dL — ABNORMAL HIGH (ref <117)

## 2015-01-06 LAB — MAGNESIUM: Magnesium: 2 mg/dL (ref 1.5–2.5)

## 2015-01-06 MED ORDER — POTASSIUM CHLORIDE CRYS ER 20 MEQ PO TBCR
20.0000 meq | EXTENDED_RELEASE_TABLET | Freq: Two times a day (BID) | ORAL | Status: DC
  Administered 2015-01-06: 20 meq via ORAL
  Filled 2015-01-06: qty 1

## 2015-01-06 MED ORDER — ALBUTEROL SULFATE HFA 108 (90 BASE) MCG/ACT IN AERS: 2.0000 | INHALATION_SPRAY | Freq: Four times a day (QID) | RESPIRATORY_TRACT | Status: DC | PRN

## 2015-01-06 MED ORDER — POTASSIUM CHLORIDE CRYS ER 20 MEQ PO TBCR: 20.0000 meq | EXTENDED_RELEASE_TABLET | Freq: Every day | ORAL | Status: DC

## 2015-01-06 MED ORDER — OMEPRAZOLE 20 MG PO CPDR: 40.0000 mg | DELAYED_RELEASE_CAPSULE | Freq: Every day | ORAL | Status: DC

## 2015-01-06 NOTE — Care Management Note (Signed)
    Page 1 of 1   01/06/2015     10:46:46 AM CARE MANAGEMENT NOTE 01/06/2015  Patient:  [name], [name]   Account Number:  [id number]  Date Initiated:  01/06/2015  Documentation initiated by:  Jolene Provost  Subjective/Objective Assessment:   Pt admitted for chest pain. Pt is from home with self care, lives with husband and is independent at baseline. Pt has no HH services, DME's or med needs prior to admission.     Action/Plan:   Pt plans to discharge home with self care, possibly today. Pt has no CM needs.   Anticipated DC Date:  01/06/2015   Anticipated DC Plan:  [place]  CM consult      Choice offered to / List presented to:             Status of service:  Completed, signed off Medicare Important Message given?   (If response is "NO", the following Medicare IM given date fields will be blank) Date Medicare IM given:   Medicare IM given by:   Date Additional Medicare IM given:   Additional Medicare IM given by:    Discharge Disposition:  HOME/SELF CARE  Per UR Regulation:    If discussed at Long Length of Stay Meetings, dates discussed:    Comments:  01/06/2015 [place], RN, MSN, [facility]

## 2015-01-06 NOTE — Progress Notes (Signed)
Pt discharged home today per Dr. Caryn Section. Pt's IV site D/C'd and WDL. Pt's VSS. Pt provided with home medication list, discharge instructions and prescriptions. Verbalized understanding. Pt to leave floor via WC in stable condition accompanied by NT.

## 2015-01-06 NOTE — Discharge Summary (Signed)
Physician Discharge Summary  [name] WUX:[id number] DOB: 1946/10/18 DOA: 01/05/2015  PCP: Purvis Kilts, MD GASTROENTEROLOGY: Hildred Laser, M.D.  Admit date: 01/05/2015 Discharge date: 01/06/2015  Time spent: Less than 30 minutes  Recommendations for Outpatient Follow-up:  1.  Recommend follow-up of the patient's serum potassium at the follow-up appointment. 2.   Discharge Diagnoses:  1. Chest pain, likely noncardiac. Myocardial infarction ruled out. --- D-dimer was negative. --- Consider cold-induced acute bronchospasm as etiology as the patient reported her pain occurred when she was exposed to the cold without headgear or coat. 2. Gastroesophageal reflux disease and history of peptic ulcer disease secondary to NSAID found on EGD December 2012.. 3. Hiatal hernia, per EGD December 2012. 4. Hypokalemia, likely secondary to hydrochlorothiazide. 5. Essential hypertension. 6. Neuropathy. 7. Chronic anxiety. 8. Asthma. 9. Recent treatment for acute bronchitis.  Discharge Condition: Improved.  Diet recommendation: Heart healthy.  Filed Weights   01/05/15 1313 01/05/15 2031  Weight: 93.895 kg (207 lb) 93.441 kg (206 lb)    History of present illness:  The patient is a 69 year old woman with a history significant for asthma, recent treatment for acute bronchitis, chronic anxiety, GERD/peptic ulcer disease, and neuropathy, who presented to the emergency department on 01/05/2015 with a chief complaint of sharp chest pain. In the ED, she was afebrile and hemodynamically stable. Her d-dimer was within normal limits. Her troponin I was negative. Her chest x-ray revealed no acute cardiopulmonary disease. Her EKG revealed normal sinus rhythm with a heart rate of 96 bpm and no ST or T-wave changes. She was admitted for further evaluation and management.  Hospital Course:  The patient reported having a normal stress test 2 years ago. She also reported having a normal echocardiogram  one year ago. The results of the echocardiogram were reviewed and were unremarkable. She was given sublingual nitroglycerin in the ED which apparently lessen her pain. Subsequently, nitroglycerin paste was added. Her chronic medications including aspirin, HCTZ,  omeprazole, when necessary Valium, and prednisone taper were continued. Her pain was also treated with as needed analgesics. Oxygen was applied and titrated to keep her oxygen saturation is greater than 92%. She was monitored on telemetry. Potassium chloride was given for repletion.  For evaluation,a number studies were ordered. Her troponin I was negative 3. Her fasting lipid profile was within normal limits revealed total cholesterol 108, triglycerides 61, HDL cholesterol 54, and LDL cholesterol 42.  Nitroglycerin ointment was discontinued because of her complaints of a headache. She was chest pain-free at the time of discharge.Her presenting chest pain was somewhat unusual as it was described as sharp and it was associated with a hot sensation and she felt like a "balloon popped". On exam, there was minimal musculoskeletal tenderness. She was recently treated for bronchitis, yet there were no wheezes or crackles on exam. The patient did report that the pain occurred when she went out in the cold without a coat. She has asthma and she may have had acute bronchial spasm when she was exposed to the cold. She was instructed to wear a coat and headgear when she goes out into the cold. She was given a prescription for albuterol MDI. She was continued on potassium chloride supplementation 20 mg once daily. She remained afebrile and hemodynamically stable. Her follow-up EKG was normal sinus rhythm with no ST or T-wave abnormalities.  Procedures:  None  Consultations:  None  Discharge Exam: Filed Vitals:   01/06/15 1433  BP: 131/56  Pulse: 74  Temp:  98.6 F (37 C)  Resp: 20    General: 68 year old woman laying in bed, in no acute  distress. Cardiovascular: S1, S2, with a soft systolic murmur. Respiratory: Clear to auscultation bilaterally. Chest wall mild tenderness over the left chest wall and substernal area; no spasms, erythema, or rash. Extremities: No pedal edema. No calf edema. No cords palpated.  Discharge Instructions   Discharge Instructions    Diet - low sodium heart healthy    Complete by:  As directed      Increase activity slowly    Complete by:  As directed           Current Discharge Medication List    START taking these medications   Details  albuterol (PROVENTIL HFA;VENTOLIN HFA) 108 (90 BASE) MCG/ACT inhaler Inhale 2 puffs into the lungs every 6 (six) hours as needed for wheezing or shortness of breath. Qty: 1 Inhaler, Refills: 2    potassium chloride SA (K-DUR,KLOR-CON) 20 MEQ tablet Take 1 tablet (20 mEq total) by mouth daily. Qty: 30 tablet, Refills: 0      CONTINUE these medications which have CHANGED   Details  omeprazole (PRILOSEC) 20 MG capsule Take 2 capsules (40 mg total) by mouth daily.   Associated Diagnoses: GERD (gastroesophageal reflux disease)      CONTINUE these medications which have NOT CHANGED   Details  aspirin EC 81 MG tablet Take 81 mg by mouth every morning.    Biotin 1000 MCG tablet Take 5,000 mcg by mouth every evening.     Cholecalciferol (VITAMIN D) 2000 UNITS CAPS Take 1 capsule by mouth daily.      diazepam (VALIUM) 5 MG tablet Take 5 mg by mouth every 8 (eight) hours as needed for anxiety.     ferrous sulfate 325 (65 FE) MG tablet Take 325 mg by mouth daily with breakfast.    gabapentin (NEURONTIN) 300 MG capsule Take 300 mg by mouth at bedtime.     hydrochlorothiazide (HYDRODIURIL) 25 MG tablet Take 12.5 mg by mouth daily.    montelukast (SINGULAIR) 10 MG tablet Take 10 mg by mouth every morning.     nitroGLYCERIN (NITROSTAT) 0.4 MG SL tablet Place 1 tablet (0.4 mg total) under the tongue every 5 (five) minutes as needed for chest pain. Qty:  30 tablet, Refills: 12    predniSONE (DELTASONE) 10 MG tablet Take 20 mg by mouth daily. Refills: 0    Probiotic Product (PROBIOTIC FORMULA) CAPS Take 1 capsule by mouth daily.       Allergies  Allergen Reactions  . Nsaids Other (See Comments)    Caused Ulcers.  . Cephalexin     unknown  . Darvocet [Propoxyphene N-Acetaminophen]     nausea  . Percocet [Oxycodone-Acetaminophen] Nausea Only  . Penicillins Rash and Other (See Comments)    Redness   Follow-up Information    Follow up with Purvis Kilts, MD.   Specialty:  Family Medicine   Why:  F/up as scheduled.   Contact information:   [address] [place], [place] [address] [phone]        The results of significant diagnostics from this hospitalization (including imaging, microbiology, ancillary and laboratory) are listed below for reference.    Significant Diagnostic Studies: Dg Chest 2 View  01/05/2015   CLINICAL DATA:  Chest pain since this morning.  EXAM: CHEST  2 VIEW  COMPARISON:  12/25/2014  FINDINGS: Moderate-sized hiatal hernia. Heart is normal size. Probable calcified granuloma peripherally in the left lung  base. Lungs otherwise clear. No effusions. No acute bony abnormality.  IMPRESSION: No active cardiopulmonary disease.   Electronically Signed   By: Rolm Baptise M.D.   On: 01/05/2015 13:39   Dg Chest 2 View  12/25/2014   CLINICAL DATA:  Cough congestion and chest soreness lip past 3 weeks; 4 days of fever; no shortness of breath or history of tobacco use; history of asthma.  EXAM: CHEST  2 VIEW  COMPARISON:  Chest x-ray dated February 13, 2014 and CT scan of the chest of July 18, 2013  FINDINGS: The lungs are adequately inflated. There is no focal infiltrate. There is no pleural effusion or pneumothorax. There is a moderate-sized hiatal hernia. The heart and pulmonary vascularity appear normal. The patient has undergone previous anterior cervical fusion. The thoracic vertebral bodies are preserved in  height.  IMPRESSION: There is no evidence of pneumonia or other acute cardiopulmonary abnormality. There is a moderate-sized hiatal hernia.   Electronically Signed   By: David  Martinique   On: 12/25/2014 08:22    Microbiology: No results found for this or any previous visit (from the past 240 hour(s)).   Labs: Basic Metabolic Panel:  Recent Labs Lab 01/05/15 1352 01/06/15 0520  NA 140 139  K 3.2* 3.4*  CL 105 104  CO2 28 28  GLUCOSE 103* 111*  BUN 31* 23  CREATININE 1.00 0.80  CALCIUM 8.7 8.5  MG  --  2.0   Liver Function Tests:  Recent Labs Lab 01/05/15 1352 01/06/15 0520  AST 15 14  ALT 28 24  ALKPHOS 69 60  BILITOT 0.6 0.7  PROT 6.4 5.6*  ALBUMIN 3.9 3.4*   No results for input(s): LIPASE, AMYLASE in the last 168 hours. No results for input(s): AMMONIA in the last 168 hours. CBC:  Recent Labs Lab 01/05/15 1352 01/06/15 0520  WBC 14.3* 9.9  NEUTROABS 9.7*  --   HGB 13.7 12.1  HCT 41.9 37.8  MCV 91.5 92.2  PLT 233 216   Cardiac Enzymes:  Recent Labs Lab 01/05/15 1352 01/06/15 0520 01/06/15 1331  TROPONINI <0.03 <0.03 <0.03   BNP: BNP (last 3 results) No results for input(s): PROBNP in the last 8760 hours. CBG:  Recent Labs Lab 01/05/15 1823  GLUCAP 139*       Signed:  Krizia Flight  [facility] 01/06/2015, 2:50 PM

## 2015-01-29 ENCOUNTER — Other Ambulatory Visit (HOSPITAL_COMMUNITY): Payer: Self-pay | Admitting: Family Medicine

## 2015-01-29 DIAGNOSIS — Z6838 Body mass index (BMI) 38.0-38.9, adult: Secondary | ICD-10-CM | POA: Diagnosis not present

## 2015-01-29 DIAGNOSIS — Z0000 Encounter for general adult medical examination without abnormal findings: Secondary | ICD-10-CM | POA: Diagnosis not present

## 2015-01-29 DIAGNOSIS — Z23 Encounter for immunization: Secondary | ICD-10-CM | POA: Diagnosis not present

## 2015-01-30 ENCOUNTER — Other Ambulatory Visit (HOSPITAL_COMMUNITY): Payer: Self-pay | Admitting: Family Medicine

## 2015-01-30 DIAGNOSIS — M810 Age-related osteoporosis without current pathological fracture: Secondary | ICD-10-CM

## 2015-01-30 DIAGNOSIS — M8580 Other specified disorders of bone density and structure, unspecified site: Secondary | ICD-10-CM

## 2015-02-04 ENCOUNTER — Ambulatory Visit (HOSPITAL_COMMUNITY)
Admission: RE | Admit: 2015-02-04 | Discharge: 2015-02-04 | Disposition: A | Discharge status: Home / Self Care | Payer: Medicare Other | Source: Ambulatory Visit | Attending: Family Medicine | Admitting: Family Medicine

## 2015-02-04 DIAGNOSIS — M859 Disorder of bone density and structure, unspecified: Secondary | ICD-10-CM | POA: Diagnosis not present

## 2015-02-04 DIAGNOSIS — Z780 Asymptomatic menopausal state: Secondary | ICD-10-CM | POA: Diagnosis not present

## 2015-02-04 DIAGNOSIS — M810 Age-related osteoporosis without current pathological fracture: Secondary | ICD-10-CM | POA: Insufficient documentation

## 2015-02-04 DIAGNOSIS — M8580 Other specified disorders of bone density and structure, unspecified site: Secondary | ICD-10-CM

## 2015-02-11 ENCOUNTER — Other Ambulatory Visit (HOSPITAL_COMMUNITY): Payer: Self-pay | Admitting: Family Medicine

## 2015-02-11 DIAGNOSIS — Z1231 Encounter for screening mammogram for malignant neoplasm of breast: Secondary | ICD-10-CM

## 2015-02-18 ENCOUNTER — Ambulatory Visit (HOSPITAL_COMMUNITY)
Admission: RE | Admit: 2015-02-18 | Discharge: 2015-02-18 | Disposition: A | Discharge status: Home / Self Care | Payer: Medicare Other | Source: Ambulatory Visit | Attending: Family Medicine | Admitting: Family Medicine

## 2015-02-18 DIAGNOSIS — Z1231 Encounter for screening mammogram for malignant neoplasm of breast: Secondary | ICD-10-CM

## 2015-02-26 ENCOUNTER — Ambulatory Visit (INDEPENDENT_AMBULATORY_CARE_PROVIDER_SITE_OTHER): Payer: Medicare Other | Admitting: Internal Medicine

## 2015-02-26 ENCOUNTER — Encounter (INDEPENDENT_AMBULATORY_CARE_PROVIDER_SITE_OTHER): Payer: Self-pay | Admitting: Internal Medicine

## 2015-02-26 VITALS — BP 142/70 | HR 64 | Temp 97.7°F | Ht 62.5 in | Wt 213.4 lb

## 2015-02-26 DIAGNOSIS — R195 Other fecal abnormalities: Secondary | ICD-10-CM

## 2015-02-26 NOTE — Progress Notes (Addendum)
Subjective:    Patient ID: [name], female    DOB: 08/23/1946, 69 y.o.   MRN: [id number]  HPI Here today for f/u. Last seen in December. She had  positive hemocult card x 3 by her GYN Three stool cards sent home with her and all were negative by our office. She was also guaiac negative in our office in December. Here last colonoscopy was 2014 and was normal.  She denies seeing any blood. There has been no weight loss.  Last colonoscopy in 2014 was normal . Appetite is good.  No abdominal pain.  BMs are dark brown. Stools are not black. Usually has a BM daily. Occasionally has urgency which is normal for her.      09/26/2013 Colonoscopy average risk:  Findings:  Prep excellent. Very tortuous colon. Slims cold used to complete exam. Normal mucosa throughout. Normal mucosa of rectum and anorectal junction except focal thickening of anoderm. Impression:  Normal colonoscopy except tortuous colon.   EGD 12/08/2011 Dr. Laural Golden:  Indications: Patient is 69 year old Caucasian female who was recently evaluated for abdominal pain and noted to have dilated stomach and proximal duodenum. She had gastric emptying study which is abnormal. She is presently not have any epigastric pain. She does complain of bloating. She says her heartburn is well controlled with therapy. She also has known hiatal hernia. Patient is on meloxicam admission to regular strength aspirin. She is undergoing diagnostic EGD. Impression: Moderate size sliding hiatal hernia. Erosive antral gastritis with two prepyloric ulcers and patent pylorus. Suspect peptic ulcer disease secondary to NSAID therapy and this may also explain abnormal gastric emptying study.     CBC    Component Value Date/Time   WBC 9.9 01/06/2015 0520   RBC 4.10 01/06/2015 0520   HGB 12.1 01/06/2015 0520   HCT 37.8  01/06/2015 0520   PLT 216 01/06/2015 0520   MCV 92.2 01/06/2015 0520   MCH 29.5 01/06/2015 0520   MCHC 32.0 01/06/2015 0520   RDW 15.0 01/06/2015 0520   LYMPHSABS 3.2 01/05/2015 1352   MONOABS 1.0 01/05/2015 1352   EOSABS 0.4 01/05/2015 1352   BASOSABS 0.1 01/05/2015 1352        Review of Systems Past Medical History  Diagnosis Date  . Irregular heart beat   . Asthma   . GERD (gastroesophageal reflux disease)   . Arthritis   . Anginal pain     cardiac clearance on the chart-pt also has GI promblems  . Murmur      Since Birth followed by Dr Verl Blalock.  . H/O hiatal hernia     'Sliding"  . 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 in left ear.  Left  side of body weaker than right.  Carpal tunnel bil .  Marland Kitchen Carpal tunnel syndrome     bilateral- wears splints  . Ulcer     gastric  . Anemia     takes iron  . Chronic anxiety   . Chest pain 01/05/2015    MI ruled out.    Past Surgical History  Procedure Laterality Date  . Appendectomy    . Cholecystectomy    . Neck surgery      x 2   . Knee arthroscopy      Left  . Esophagogastroduodenoscopy  12/08/2011    Procedure: ESOPHAGOGASTRODUODENOSCOPY (EGD);  Surgeon: Rogene Houston, MD;  [facility]: [facility];  Service: Endoscopy;  Laterality: N/A;  3:00   .  Colonoscopy    . Upper gastrointestinal endoscopy    . Hysterscopy    . Total knee arthroplasty  07/11/2012    Procedure: TOTAL KNEE ARTHROPLASTY;  Surgeon: Ninetta Lights, MD;  Location: [facility];  Service: Orthopedics;  Laterality: Left;  left total knee arthroplasty  . Tonsillectomy    . Eye surgery Bilateral     cataract surgery  . Colonoscopy N/A 09/26/2013    Procedure: COLONOSCOPY;  Surgeon: Rogene Houston, MD;  Location: [facility];  Service: Endoscopy;  Laterality: N/A;  1030-rescheduled to 12:00pm Ann notified pt    Allergies  Allergen Reactions  . Nsaids Other (See Comments)    Caused Ulcers.  . Cephalexin      unknown  . Darvocet [Propoxyphene N-Acetaminophen]     nausea  . Percocet [Oxycodone-Acetaminophen] Nausea Only  . Penicillins Rash and Other (See Comments)    Redness    Current Outpatient Prescriptions on File Prior to Visit  Medication Sig Dispense Refill  . albuterol (PROVENTIL HFA;VENTOLIN HFA) 108 (90 BASE) MCG/ACT inhaler Inhale 2 puffs into the lungs every 6 (six) hours as needed for wheezing or shortness of breath. 1 Inhaler 2  . aspirin EC 81 MG tablet Take 81 mg by mouth every morning.    . Biotin 1000 MCG tablet Take 5,000 mcg by mouth every evening.     . Cholecalciferol (VITAMIN D) 2000 UNITS CAPS Take 1 capsule by mouth daily.      . diazepam (VALIUM) 5 MG tablet Take 5 mg by mouth every 8 (eight) hours as needed for anxiety.     . ferrous sulfate 325 (65 FE) MG tablet Take 325 mg by mouth daily with breakfast.    . gabapentin (NEURONTIN) 300 MG capsule Take 300 mg by mouth at bedtime.     . hydrochlorothiazide (HYDRODIURIL) 25 MG tablet Take 12.5 mg by mouth daily.    . montelukast (SINGULAIR) 10 MG tablet Take 10 mg by mouth every morning.     . nitroGLYCERIN (NITROSTAT) 0.4 MG SL tablet Place 1 tablet (0.4 mg total) under the tongue every 5 (five) minutes as needed for chest pain. 30 tablet 12  . omeprazole (PRILOSEC) 20 MG capsule Take 2 capsules (40 mg total) by mouth daily.    . potassium chloride SA (K-DUR,KLOR-CON) 20 MEQ tablet Take 1 tablet (20 mEq total) by mouth daily. 30 tablet 0  . predniSONE (DELTASONE) 10 MG tablet Take 20 mg by mouth daily.  0  . Probiotic Product (PROBIOTIC FORMULA) CAPS Take 1 capsule by mouth daily.     No current facility-administered medications on file prior to visit.        Objective:   Physical Exam  Filed Vitals:   02/26/15 1408  Height: 5' 2.5" (1.588 m)  Weight: 213 lb 6.4 oz (96.798 kg)  Alert and oriented. Skin warm and dry. Oral mucosa is moist.   . Sclera anicteric, conjunctivae is pink. Thyroid not enlarged. No  cervical lymphadenopathy. Lungs clear. Heart regular rate and rhythm.  Abdomen is soft. Bowel sounds are positive. No hepatomegaly. No abdominal masses felt. No tenderness.  No edema to lower extremities.  Stool brown and guaiac negative.         Assessment & Plan:  Guaiac positive stool cards by GYN. Three stool card negative by our office.  Will send three stool cards home with her today. If any are positive will schedule a colonoscopy

## 2015-02-26 NOTE — Patient Instructions (Signed)
Stool cards x 3 sent home with patient. If any positive will proceed with colonoscopy

## 2015-03-09 ENCOUNTER — Telehealth (INDEPENDENT_AMBULATORY_CARE_PROVIDER_SITE_OTHER): Payer: Self-pay | Admitting: *Deleted

## 2015-03-09 NOTE — Telephone Encounter (Signed)
   Diagnosis:    Result(s)   Card 1: Positive:      Card 2: Positive:     Card 3: Positive:      Completed by: Jacara Benito,LPN   HEMOCCULT SENSA DEVELOPER: LOT#:  [id number] EXPIRATION DATE: 9-17   HEMOCCULT SENSA CARD:  LOT#:  02/14 EXPIRATION DATE: 07/18   CARD CONTROL RESULTS:  POSITIVE: Positive NEGATIVE: Negative   ADDITIONAL COMMENTS: Forwarded to [facility] for review.

## 2015-03-09 NOTE — Telephone Encounter (Signed)
Will get a CBC on her and iron studies. She may need an EGD/Colonoscopy. I discussed with Dr. Laural Golden.

## 2015-03-10 ENCOUNTER — Telehealth (INDEPENDENT_AMBULATORY_CARE_PROVIDER_SITE_OTHER): Payer: Self-pay | Admitting: Internal Medicine

## 2015-03-10 DIAGNOSIS — R195 Other fecal abnormalities: Secondary | ICD-10-CM | POA: Diagnosis not present

## 2015-03-10 LAB — CBC WITH DIFFERENTIAL/PLATELET
Basophils Absolute: 0 10*3/uL (ref 0.0–0.1)
Basophils Relative: 0 % (ref 0–1)
EOS ABS: 0.5 10*3/uL (ref 0.0–0.7)
Eosinophils Relative: 7 % — ABNORMAL HIGH (ref 0–5)
HCT: 39.4 % (ref 36.0–46.0)
Hemoglobin: 13 g/dL (ref 12.0–15.0)
Lymphocytes Relative: 27 % (ref 12–46)
Lymphs Abs: 2 10*3/uL (ref 0.7–4.0)
MCH: 29.4 pg (ref 26.0–34.0)
MCHC: 33 g/dL (ref 30.0–36.0)
MCV: 89.1 fL (ref 78.0–100.0)
MPV: 10 fL (ref 8.6–12.4)
Monocytes Absolute: 0.5 10*3/uL (ref 0.1–1.0)
Monocytes Relative: 7 % (ref 3–12)
NEUTROS ABS: 4.4 10*3/uL (ref 1.7–7.7)
Neutrophils Relative %: 59 % (ref 43–77)
PLATELETS: 247 10*3/uL (ref 150–400)
RBC: 4.42 MIL/uL (ref 3.87–5.11)
RDW: 14.8 % (ref 11.5–15.5)
WBC: 7.4 10*3/uL (ref 4.0–10.5)

## 2015-03-10 NOTE — Telephone Encounter (Signed)
Will get a CBC today. Patient will probably need an EGD/Colonoscopy

## 2015-03-10 NOTE — Telephone Encounter (Signed)
Results given to patient. Will get a CBC. If abnormal iron studies.

## 2015-03-16 ENCOUNTER — Other Ambulatory Visit (INDEPENDENT_AMBULATORY_CARE_PROVIDER_SITE_OTHER): Payer: Self-pay | Admitting: *Deleted

## 2015-03-16 ENCOUNTER — Telehealth (INDEPENDENT_AMBULATORY_CARE_PROVIDER_SITE_OTHER): Payer: Self-pay | Admitting: Internal Medicine

## 2015-03-16 ENCOUNTER — Telehealth (INDEPENDENT_AMBULATORY_CARE_PROVIDER_SITE_OTHER): Payer: Self-pay | Admitting: *Deleted

## 2015-03-16 DIAGNOSIS — R195 Other fecal abnormalities: Secondary | ICD-10-CM

## 2015-03-16 DIAGNOSIS — Z1211 Encounter for screening for malignant neoplasm of colon: Secondary | ICD-10-CM

## 2015-03-16 NOTE — Telephone Encounter (Signed)
Patient needs trilyte 

## 2015-03-16 NOTE — Telephone Encounter (Signed)
addressed

## 2015-03-18 MED ORDER — PEG 3350-KCL-NA BICARB-NACL 420 G PO SOLR: 4000.0000 mL | Freq: Once | ORAL | Status: DC

## 2015-03-27 ENCOUNTER — Encounter (HOSPITAL_COMMUNITY): Payer: Self-pay | Admitting: *Deleted

## 2015-03-27 ENCOUNTER — Encounter (HOSPITAL_COMMUNITY)
Admission: RE | Disposition: A | Discharge status: Home / Self Care | Payer: Self-pay | Source: Ambulatory Visit | Attending: Internal Medicine

## 2015-03-27 ENCOUNTER — Ambulatory Visit (HOSPITAL_COMMUNITY)
Admission: RE | Admit: 2015-03-27 | Discharge: 2015-03-27 | Disposition: A | Discharge status: Home / Self Care | Payer: Medicare Other | Source: Ambulatory Visit | Attending: Internal Medicine | Admitting: Internal Medicine

## 2015-03-27 DIAGNOSIS — Z888 Allergy status to other drugs, medicaments and biological substances status: Secondary | ICD-10-CM | POA: Diagnosis not present

## 2015-03-27 DIAGNOSIS — J45909 Unspecified asthma, uncomplicated: Secondary | ICD-10-CM | POA: Diagnosis not present

## 2015-03-27 DIAGNOSIS — M1990 Unspecified osteoarthritis, unspecified site: Secondary | ICD-10-CM | POA: Diagnosis not present

## 2015-03-27 DIAGNOSIS — Z8711 Personal history of peptic ulcer disease: Secondary | ICD-10-CM | POA: Insufficient documentation

## 2015-03-27 DIAGNOSIS — R195 Other fecal abnormalities: Secondary | ICD-10-CM

## 2015-03-27 DIAGNOSIS — K449 Diaphragmatic hernia without obstruction or gangrene: Secondary | ICD-10-CM | POA: Diagnosis not present

## 2015-03-27 DIAGNOSIS — Z886 Allergy status to analgesic agent status: Secondary | ICD-10-CM | POA: Insufficient documentation

## 2015-03-27 DIAGNOSIS — Z7982 Long term (current) use of aspirin: Secondary | ICD-10-CM | POA: Insufficient documentation

## 2015-03-27 DIAGNOSIS — K6389 Other specified diseases of intestine: Secondary | ICD-10-CM | POA: Insufficient documentation

## 2015-03-27 DIAGNOSIS — Z880 Allergy status to penicillin: Secondary | ICD-10-CM | POA: Diagnosis not present

## 2015-03-27 DIAGNOSIS — K317 Polyp of stomach and duodenum: Secondary | ICD-10-CM | POA: Insufficient documentation

## 2015-03-27 DIAGNOSIS — K648 Other hemorrhoids: Secondary | ICD-10-CM | POA: Diagnosis not present

## 2015-03-27 DIAGNOSIS — Z9049 Acquired absence of other specified parts of digestive tract: Secondary | ICD-10-CM | POA: Insufficient documentation

## 2015-03-27 DIAGNOSIS — Z96652 Presence of left artificial knee joint: Secondary | ICD-10-CM | POA: Diagnosis not present

## 2015-03-27 DIAGNOSIS — F419 Anxiety disorder, unspecified: Secondary | ICD-10-CM | POA: Insufficient documentation

## 2015-03-27 DIAGNOSIS — K5520 Angiodysplasia of colon without hemorrhage: Secondary | ICD-10-CM | POA: Insufficient documentation

## 2015-03-27 DIAGNOSIS — K253 Acute gastric ulcer without hemorrhage or perforation: Secondary | ICD-10-CM | POA: Diagnosis not present

## 2015-03-27 DIAGNOSIS — D649 Anemia, unspecified: Secondary | ICD-10-CM | POA: Diagnosis not present

## 2015-03-27 DIAGNOSIS — K219 Gastro-esophageal reflux disease without esophagitis: Secondary | ICD-10-CM | POA: Diagnosis not present

## 2015-03-27 HISTORY — PX: ESOPHAGOGASTRODUODENOSCOPY: SHX5428

## 2015-03-27 HISTORY — PX: COLONOSCOPY: SHX5424

## 2015-03-27 SURGERY — COLONOSCOPY
Anesthesia: Moderate Sedation

## 2015-03-27 MED ORDER — MEPERIDINE HCL 50 MG/ML IJ SOLN
INTRAMUSCULAR | Status: AC
  Filled 2015-03-27: qty 1

## 2015-03-27 MED ORDER — MEPERIDINE HCL 50 MG/ML IJ SOLN
INTRAMUSCULAR | Status: DC | PRN
  Administered 2015-03-27 (×2): 25 mg via INTRAVENOUS

## 2015-03-27 MED ORDER — MIDAZOLAM HCL 5 MG/5ML IJ SOLN
INTRAMUSCULAR | Status: AC
  Filled 2015-03-27: qty 10

## 2015-03-27 MED ORDER — MIDAZOLAM HCL 5 MG/5ML IJ SOLN
INTRAMUSCULAR | Status: AC
  Filled 2015-03-27: qty 5

## 2015-03-27 MED ORDER — SODIUM CHLORIDE 0.9 % IJ SOLN
INTRAMUSCULAR | Status: AC
  Filled 2015-03-27: qty 3

## 2015-03-27 MED ORDER — PROMETHAZINE HCL 25 MG/ML IJ SOLN
INTRAMUSCULAR | Status: AC
  Filled 2015-03-27: qty 1

## 2015-03-27 MED ORDER — PROMETHAZINE HCL 25 MG/ML IJ SOLN
INTRAMUSCULAR | Status: DC | PRN
  Administered 2015-03-27: 12.5 mg via INTRAVENOUS

## 2015-03-27 MED ORDER — STERILE WATER FOR IRRIGATION IR SOLN
Status: DC | PRN
  Administered 2015-03-27: 08:00:00

## 2015-03-27 MED ORDER — BUTAMBEN-TETRACAINE-BENZOCAINE 2-2-14 % EX AERO
INHALATION_SPRAY | CUTANEOUS | Status: DC | PRN
  Administered 2015-03-27: 2 via TOPICAL

## 2015-03-27 MED ORDER — MIDAZOLAM HCL 5 MG/5ML IJ SOLN
INTRAMUSCULAR | Status: DC | PRN
  Administered 2015-03-27 (×7): 2 mg via INTRAVENOUS

## 2015-03-27 MED ORDER — SODIUM CHLORIDE 0.9 % IV SOLN: INTRAVENOUS | Status: DC

## 2015-03-27 NOTE — Discharge Instructions (Signed)
Resume usual medications and diet. No driving for 24 hours. Physician will call with biopsy results.  Colonoscopy, Care After Refer to this sheet in the next few weeks. These instructions provide you with information on caring for yourself after your procedure. Your health care provider may also give you more specific instructions. Your treatment has been planned according to current medical practices, but problems sometimes occur. Call your health care provider if you have any problems or questions after your procedure. WHAT TO EXPECT AFTER THE PROCEDURE  After your procedure, it is typical to have the following:  A small amount of blood in your stool.  Moderate amounts of gas and mild abdominal cramping or bloating. HOME CARE INSTRUCTIONS  Do not drive, operate machinery, or sign important documents for 24 hours.  You may shower and resume your regular physical activities, but move at a slower pace for the first 24 hours.  Take frequent rest periods for the first 24 hours.  Walk around or put a warm pack on your abdomen to help reduce abdominal cramping and bloating.  Drink enough fluids to keep your urine clear or pale yellow.  You may resume your normal diet as instructed by your health care provider. Avoid heavy or fried foods that are hard to digest.  Avoid drinking alcohol for 24 hours or as instructed by your health care provider.  Only take over-the-counter or prescription medicines as directed by your health care provider.  If a tissue sample (biopsy) was taken during your procedure:  Do not take aspirin or blood thinners for 7 days, or as instructed by your health care provider.  Do not drink alcohol for 7 days, or as instructed by your health care provider.  Eat soft foods for the first 24 hours. SEEK MEDICAL CARE IF: You have persistent spotting of blood in your stool 2-3 days after the procedure. SEEK IMMEDIATE MEDICAL CARE IF:  You have more than a small  spotting of blood in your stool.  You pass large blood clots in your stool.  Your abdomen is swollen (distended).  You have nausea or vomiting.  You have a fever.  You have increasing abdominal pain that is not relieved with medicine. Document Released: 07/26/2004 Document Revised: 10/02/2013 Document Reviewed: 08/19/2013 [facility] Patient Information 2015 [place], [place]. This information is not intended to replace advice given to you by your health care provider. Make sure you discuss any questions you have with your health care provider.

## 2015-03-27 NOTE — Op Note (Signed)
EGD PROCEDURE REPORT  PATIENT:  [name]  MR#:  [id number] Birthdate:  04-25-1946, 69 y.o., female Endoscopist:  Dr. Rogene Houston, MD Referred By:  Dr. Purvis Kilts, MD  Procedure Date: 03/27/2015  Procedure:   EGD & Colonoscopy  Indications:  Patient is 69 year old Caucasian female was chronic GERD and history of peptic ulcer disease who was noted to have multiple heme-positive stools. Her H&H was normal. Last colonoscopy was less than 2 years ago.. She is undergoing diagnostic EGD and colonoscopy.            Informed Consent:  The risks, benefits, alternatives & imponderables which include, but are not limited to, bleeding, infection, perforation, drug reaction and potential missed lesion have been reviewed.  The potential for biopsy, lesion removal, esophageal dilation, etc. have also been discussed.  Questions have been answered.  All parties agreeable.  Please see history & physical in medical record for more information.  Medications:  Demerol 50 mg IV Versed 14 mg IV Promethazine 12.5 mg IV and diluted form Cetacaine spray topically for oropharyngeal anesthesia  EGD  Description of procedure:  The endoscope was introduced through the mouth and advanced to the second portion of the duodenum without difficulty or limitations. The mucosal surfaces were surveyed very carefully during advancement of the scope and upon withdrawal.  Findings:  Esophagus:  Mucosa of the esophagus was normal. GE junction was unremarkable. GEJ:  33 cm Hiatus:  39 cm Stomach:  Stomach was empty and distended very well with insufflation. Folds in the proximal stomach were normal. Examination mucosa gastric body was normal. Multiple polyps are noted at antrum ranging in size from 4-10 mm. Most of these polyps were 5-6 mm. Antral mucosa was normal. Fundus and cardia were unremarkable. Hernia was easily seen on retroflex view. Single erosion noted involving herniated part of the stomach. Duodenum:   Normal bulbar and post bulbar mucosa.  Therapeutic/Diagnostic Maneuvers Performed:   6 of the polyps were biopsied for routine histology. Specimens submitted together.  COLONOSCOPY Description of procedure:  After a digital rectal exam was performed, that colonoscope was advanced from the anus through the rectum and colon to the area of the cecum, ileocecal valve and appendiceal orifice. The cecum was deeply intubated. These structures were well-seen and photographed for the record. From the level of the cecum and ileocecal valve, the scope was slowly and cautiously withdrawn. The mucosal surfaces were carefully surveyed utilizing scope tip to flexion to facilitate fold flattening as needed. The scope was pulled down into the rectum where a thorough exam including retroflexion was performed.  Findings:   Excellent prep. Very redundant tortuous colon. 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 Normal rectal mucosa. Small hemorrhoids below the dentate line along with thickened anoderm.  Therapeutic/Diagnostic Maneuvers Performed:  None  Complications:  None  Cecal Withdrawal Time:  11 minutes  Impression:  EGD findings; No evidence of erosive esophagitis or peptic ulcer disease. Moderate to large sliding hiatal hernia with single erosion involving herniated part of the stomach. Multiple gastric polyps and antrum ranging in size from 4-10 mm. 6 of these were biopsied for routine histology.  Colonoscopy findings; Examination performed to cecum. 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 Small external hemorrhoids.  Comment; She could be losing blood from her upper GI tract.  Recommendations:  Standard instructions given. I will be contacting patient with biopsy results and further  recommendations.  Magie Ciampa U  03/27/2015 9:27 AM  CC: Dr. Hilma Favors, Betsy Coder, MD & Dr. Rayne Du  ref. provider found

## 2015-03-27 NOTE — H&P (Signed)
[name] is an 69 y.o. female.   Chief Complaint: Patient is here for EGD and colonoscopy. HPI: Patient is 69 year old Caucasian female was chronic GERD and history of peptic ulcer disease noted to have multiple heme-positive stools. She denies melena or rectal bleeding abdominal pain. His last colonoscopy was less than 2 years ago. Given multiple heme-positive stools is concerned lesion could have been missed. Patient is on low-dose aspirin but does not take other OTC NSAIDs. Recent CBC was normal. Family history is negative for CRC.   Past Medical History  Diagnosis Date  . Irregular heart beat   . Asthma   . GERD (gastroesophageal reflux disease)   . Arthritis   . Anginal pain     cardiac clearance on the chart-pt also has GI promblems  . Murmur      Since Birth followed by Dr Verl Blalock.  . H/O hiatal hernia     'Sliding"  . 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 in left ear.  Left  side of body weaker than right.  Carpal tunnel bil .  Marland Kitchen Carpal tunnel syndrome     bilateral- wears splints  . Ulcer     gastric  . Anemia     takes iron  . Chronic anxiety   . Chest pain 01/05/2015    MI ruled out.    Past Surgical History  Procedure Laterality Date  . Appendectomy    . Cholecystectomy    . Neck surgery      x 2   . Knee arthroscopy      Left  . Esophagogastroduodenoscopy  12/08/2011    Procedure: ESOPHAGOGASTRODUODENOSCOPY (EGD);  Surgeon: Rogene Houston, MD;  Location: [facility];  Service: Endoscopy;  Laterality: N/A;  3:00   . Colonoscopy    . Upper gastrointestinal endoscopy    . Hysterscopy    . Total knee arthroplasty  07/11/2012    Procedure: TOTAL KNEE ARTHROPLASTY;  Surgeon: Ninetta Lights, MD;  Location: [facility];  Service: Orthopedics;  Laterality: Left;  left total knee arthroplasty  . Tonsillectomy    . Eye surgery Bilateral     cataract surgery  . Colonoscopy N/A 09/26/2013    Procedure: COLONOSCOPY;  Surgeon:  Rogene Houston, MD;  Location: [facility];  Service: Endoscopy;  Laterality: N/A;  1030-rescheduled to 12:00pm Ann notified pt    Family History  Problem Relation Age of Onset  . Colon cancer Neg Hx   . Hypertension Son   . Alzheimer's disease Mother   . Kidney disease Mother   . Kidney disease Father   . Cancer Maternal Aunt     leukemia  . Liver disease Maternal Uncle   . Kidney disease Paternal Uncle     kidney removed  . Diabetes Maternal Grandmother   . Diabetes Maternal Grandfather   . Cancer Paternal Grandmother     breast, liver   Social History:  reports that she has never smoked. She has never used smokeless tobacco. She reports that she does not drink alcohol or use illicit drugs.  Allergies:  Allergies  Allergen Reactions  . Nsaids Other (See Comments)    Caused Ulcers.  . Cephalexin     unknown  . Darvocet [Propoxyphene N-Acetaminophen]     nausea  . Percocet [Oxycodone-Acetaminophen] Nausea Only  . Penicillins Rash and Other (See Comments)    Redness    Medications Prior to Admission  Medication Sig Dispense  Refill  . aspirin EC 81 MG tablet Take 81 mg by mouth every morning.    . Biotin 1000 MCG tablet Take 5,000 mcg by mouth every evening.     . calcium carbonate (OS-CAL) 600 MG TABS tablet Take 1,200 mg by mouth 2 (two) times daily with a meal.    . Cholecalciferol (VITAMIN D) 2000 UNITS CAPS Take 1 capsule by mouth daily.      . diazepam (VALIUM) 5 MG tablet Take 5 mg by mouth every 8 (eight) hours as needed for anxiety.     . ferrous sulfate 325 (65 FE) MG tablet Take 325 mg by mouth daily with breakfast.    . gabapentin (NEURONTIN) 300 MG capsule Take 300 mg by mouth at bedtime.     . hydrochlorothiazide (HYDRODIURIL) 25 MG tablet Take 12.5 mg by mouth daily.    . montelukast (SINGULAIR) 10 MG tablet Take 10 mg by mouth every morning.     . Multiple Vitamin (MULTIVITAMIN) tablet Take 1 tablet by mouth daily.    . nitroGLYCERIN (NITROSTAT) 0.4 MG  SL tablet Place 1 tablet (0.4 mg total) under the tongue every 5 (five) minutes as needed for chest pain. 30 tablet 12  . omeprazole (PRILOSEC) 20 MG capsule Take 2 capsules (40 mg total) by mouth daily.    . polyethylene glycol-electrolytes (NULYTELY/GOLYTELY) 420 G solution Take 4,000 mLs by mouth once. 4000 mL 0  . Probiotic Product (PROBIOTIC FORMULA) CAPS Take 1 capsule by mouth daily.    Marland Kitchen albuterol (PROVENTIL HFA;VENTOLIN HFA) 108 (90 BASE) MCG/ACT inhaler Inhale 2 puffs into the lungs every 6 (six) hours as needed for wheezing or shortness of breath. 1 Inhaler 2    No results found for this or any previous visit (from the past 48 hour(s)). No results found.  ROS  Blood pressure 153/69, pulse 94, temperature 98 F (36.7 C), temperature source Oral, resp. rate 20, height 5' 2.5" (1.588 m), weight 208 lb (94.348 kg), SpO2 100 %. Physical Exam  Constitutional: She appears well-developed and well-nourished.  HENT:  Mouth/Throat: Oropharynx is clear and moist.  Eyes: Conjunctivae are normal. No scleral icterus.  Neck: No thyromegaly present.  Cardiovascular: Normal rate, regular rhythm and normal heart sounds.   No murmur heard. Respiratory: Effort normal and breath sounds normal.  GI: Soft. She exhibits no distension and no mass. There is no tenderness.  Musculoskeletal: She exhibits no edema.  Lymphadenopathy:    She has no cervical adenopathy.  Neurological: She is alert.  Skin: Skin is warm and dry.     Assessment/Plan Multiple heme positive stools. History of peptic ulcer disease. Diagnostic EGD and colonoscopy.  Demaurion Dicioccio U 03/27/2015, 8:18 AM

## 2015-03-30 ENCOUNTER — Encounter (HOSPITAL_COMMUNITY): Payer: Self-pay | Admitting: Internal Medicine

## 2015-04-03 ENCOUNTER — Telehealth (INDEPENDENT_AMBULATORY_CARE_PROVIDER_SITE_OTHER): Payer: Self-pay | Admitting: *Deleted

## 2015-04-03 DIAGNOSIS — K921 Melena: Secondary | ICD-10-CM

## 2015-04-03 NOTE — Addendum Note (Signed)
Addended by: Grayland Ormond on: 04/03/2015 08:58 AM   Modules accepted: Orders

## 2015-04-03 NOTE — Telephone Encounter (Signed)
Per Dr.Rehman the patient will need to have labs drawn in 3 months and a hemoccult.

## 2015-04-03 NOTE — Telephone Encounter (Signed)
Open in error

## 2015-04-07 ENCOUNTER — Encounter (INDEPENDENT_AMBULATORY_CARE_PROVIDER_SITE_OTHER): Payer: Self-pay | Admitting: *Deleted

## 2015-04-21 DIAGNOSIS — M25551 Pain in right hip: Secondary | ICD-10-CM | POA: Diagnosis not present

## 2015-04-21 DIAGNOSIS — M25552 Pain in left hip: Secondary | ICD-10-CM | POA: Diagnosis not present

## 2015-04-28 DIAGNOSIS — M1612 Unilateral primary osteoarthritis, left hip: Secondary | ICD-10-CM | POA: Diagnosis not present

## 2015-04-30 DIAGNOSIS — L821 Other seborrheic keratosis: Secondary | ICD-10-CM | POA: Diagnosis not present

## 2015-04-30 DIAGNOSIS — C44622 Squamous cell carcinoma of skin of right upper limb, including shoulder: Secondary | ICD-10-CM | POA: Diagnosis not present

## 2015-04-30 DIAGNOSIS — C44602 Unspecified malignant neoplasm of skin of right upper limb, including shoulder: Secondary | ICD-10-CM | POA: Diagnosis not present

## 2015-04-30 DIAGNOSIS — C44612 Basal cell carcinoma of skin of right upper limb, including shoulder: Secondary | ICD-10-CM | POA: Diagnosis not present

## 2015-05-11 ENCOUNTER — Other Ambulatory Visit (INDEPENDENT_AMBULATORY_CARE_PROVIDER_SITE_OTHER): Payer: Self-pay | Admitting: Internal Medicine

## 2015-05-13 ENCOUNTER — Other Ambulatory Visit (INDEPENDENT_AMBULATORY_CARE_PROVIDER_SITE_OTHER): Payer: Self-pay | Admitting: Internal Medicine

## 2015-05-28 ENCOUNTER — Ambulatory Visit (INDEPENDENT_AMBULATORY_CARE_PROVIDER_SITE_OTHER): Payer: Medicare Other | Admitting: Otolaryngology

## 2015-05-28 DIAGNOSIS — H6983 Other specified disorders of Eustachian tube, bilateral: Secondary | ICD-10-CM | POA: Diagnosis not present

## 2015-05-28 DIAGNOSIS — H903 Sensorineural hearing loss, bilateral: Secondary | ICD-10-CM | POA: Diagnosis not present

## 2015-06-17 ENCOUNTER — Encounter (INDEPENDENT_AMBULATORY_CARE_PROVIDER_SITE_OTHER): Payer: Self-pay | Admitting: *Deleted

## 2015-06-17 ENCOUNTER — Other Ambulatory Visit (INDEPENDENT_AMBULATORY_CARE_PROVIDER_SITE_OTHER): Payer: Self-pay | Admitting: *Deleted

## 2015-06-17 DIAGNOSIS — K921 Melena: Secondary | ICD-10-CM

## 2015-06-18 DIAGNOSIS — Z08 Encounter for follow-up examination after completed treatment for malignant neoplasm: Secondary | ICD-10-CM | POA: Diagnosis not present

## 2015-06-18 DIAGNOSIS — Z85828 Personal history of other malignant neoplasm of skin: Secondary | ICD-10-CM | POA: Diagnosis not present

## 2015-06-22 ENCOUNTER — Other Ambulatory Visit: Payer: Self-pay

## 2015-06-25 ENCOUNTER — Ambulatory Visit (INDEPENDENT_AMBULATORY_CARE_PROVIDER_SITE_OTHER): Payer: Medicare Other | Admitting: Otolaryngology

## 2015-06-25 DIAGNOSIS — H6983 Other specified disorders of Eustachian tube, bilateral: Secondary | ICD-10-CM | POA: Diagnosis not present

## 2015-07-06 DIAGNOSIS — K921 Melena: Secondary | ICD-10-CM | POA: Diagnosis not present

## 2015-07-06 LAB — HEMOGLOBIN AND HEMATOCRIT, BLOOD
HEMATOCRIT: 37.4 % (ref 36.0–46.0)
Hemoglobin: 12 g/dL (ref 12.0–15.0)

## 2015-07-17 ENCOUNTER — Telehealth (INDEPENDENT_AMBULATORY_CARE_PROVIDER_SITE_OTHER): Payer: Self-pay | Admitting: *Deleted

## 2015-07-17 DIAGNOSIS — K921 Melena: Secondary | ICD-10-CM

## 2015-07-17 NOTE — Telephone Encounter (Signed)
Noted-labs on 3 months and hemoccults in 3 months.

## 2015-07-17 NOTE — Telephone Encounter (Signed)
Per Dr.Rehman the patient will need to have labs drawn in 3 months 

## 2015-07-23 ENCOUNTER — Ambulatory Visit (INDEPENDENT_AMBULATORY_CARE_PROVIDER_SITE_OTHER): Payer: Medicare Other | Admitting: Otolaryngology

## 2015-07-23 DIAGNOSIS — H9042 Sensorineural hearing loss, unilateral, left ear, with unrestricted hearing on the contralateral side: Secondary | ICD-10-CM

## 2015-07-24 ENCOUNTER — Telehealth (INDEPENDENT_AMBULATORY_CARE_PROVIDER_SITE_OTHER): Payer: Self-pay | Admitting: *Deleted

## 2015-07-24 NOTE — Telephone Encounter (Signed)
   Diagnosis:    Result(s)   Card 1: Positive:     Card 2: Negative:   Card 3: Negative:    Completed by: Jobany Montellano,LPN   HEMOCCULT SENSA DEVELOPER: LOT#:  [id number] EXPIRATION DATE: 09/17   HEMOCCULT SENSA CARD:  LOT#:  02/14 EXPIRATION DATE: 07/18   CARD CONTROL RESULTS:  POSITIVE: Positive NEGATIVE: Negative    ADDITIONAL COMMENTS: Forwarded to Karna Christmas.

## 2015-08-04 DIAGNOSIS — Z961 Presence of intraocular lens: Secondary | ICD-10-CM | POA: Diagnosis not present

## 2015-08-04 DIAGNOSIS — E119 Type 2 diabetes mellitus without complications: Secondary | ICD-10-CM | POA: Diagnosis not present

## 2015-08-10 ENCOUNTER — Other Ambulatory Visit (INDEPENDENT_AMBULATORY_CARE_PROVIDER_SITE_OTHER): Payer: Self-pay | Admitting: Internal Medicine

## 2015-09-08 DIAGNOSIS — R739 Hyperglycemia, unspecified: Secondary | ICD-10-CM | POA: Diagnosis not present

## 2015-09-08 DIAGNOSIS — E782 Mixed hyperlipidemia: Secondary | ICD-10-CM | POA: Diagnosis not present

## 2015-09-08 DIAGNOSIS — R7309 Other abnormal glucose: Secondary | ICD-10-CM | POA: Diagnosis not present

## 2015-09-08 DIAGNOSIS — Z6837 Body mass index (BMI) 37.0-37.9, adult: Secondary | ICD-10-CM | POA: Diagnosis not present

## 2015-09-08 DIAGNOSIS — Z1389 Encounter for screening for other disorder: Secondary | ICD-10-CM | POA: Diagnosis not present

## 2015-09-08 DIAGNOSIS — R42 Dizziness and giddiness: Secondary | ICD-10-CM | POA: Diagnosis not present

## 2015-09-14 DIAGNOSIS — Z23 Encounter for immunization: Secondary | ICD-10-CM | POA: Diagnosis not present

## 2015-09-14 DIAGNOSIS — E1165 Type 2 diabetes mellitus with hyperglycemia: Secondary | ICD-10-CM | POA: Diagnosis not present

## 2015-09-14 DIAGNOSIS — Z1389 Encounter for screening for other disorder: Secondary | ICD-10-CM | POA: Diagnosis not present

## 2015-09-14 DIAGNOSIS — Z6837 Body mass index (BMI) 37.0-37.9, adult: Secondary | ICD-10-CM | POA: Diagnosis not present

## 2015-09-15 ENCOUNTER — Encounter (INDEPENDENT_AMBULATORY_CARE_PROVIDER_SITE_OTHER): Payer: Self-pay | Admitting: *Deleted

## 2015-09-15 ENCOUNTER — Other Ambulatory Visit (INDEPENDENT_AMBULATORY_CARE_PROVIDER_SITE_OTHER): Payer: Self-pay | Admitting: *Deleted

## 2015-09-15 DIAGNOSIS — K921 Melena: Secondary | ICD-10-CM

## 2015-09-28 ENCOUNTER — Ambulatory Visit (INDEPENDENT_AMBULATORY_CARE_PROVIDER_SITE_OTHER): Payer: Medicare Other | Admitting: Internal Medicine

## 2015-09-28 ENCOUNTER — Encounter (INDEPENDENT_AMBULATORY_CARE_PROVIDER_SITE_OTHER): Payer: Self-pay | Admitting: Internal Medicine

## 2015-09-28 VITALS — BP 140/80 | HR 70 | Temp 97.9°F | Resp 18 | Ht 62.5 in | Wt 206.6 lb

## 2015-09-28 DIAGNOSIS — R1310 Dysphagia, unspecified: Secondary | ICD-10-CM

## 2015-09-28 DIAGNOSIS — B3781 Candidal esophagitis: Secondary | ICD-10-CM | POA: Diagnosis not present

## 2015-09-28 DIAGNOSIS — K449 Diaphragmatic hernia without obstruction or gangrene: Secondary | ICD-10-CM | POA: Diagnosis not present

## 2015-09-28 DIAGNOSIS — Z1389 Encounter for screening for other disorder: Secondary | ICD-10-CM | POA: Diagnosis not present

## 2015-09-28 DIAGNOSIS — K219 Gastro-esophageal reflux disease without esophagitis: Secondary | ICD-10-CM

## 2015-09-28 DIAGNOSIS — Z6837 Body mass index (BMI) 37.0-37.9, adult: Secondary | ICD-10-CM | POA: Diagnosis not present

## 2015-09-28 MED ORDER — NYSTATIN 100000 UNIT/ML MT SUSP: 5.0000 mL | Freq: Four times a day (QID) | OROMUCOSAL | Status: DC

## 2015-09-28 NOTE — Progress Notes (Signed)
Presenting complaint;  Painful swallowing.  Subjective:  Patient is 69 year old Caucasian female who is here for unscheduled visit with complaints of painful swallowing which started after evening meal on 09/25/2015. She has chronic GERD and is on therapy. She has not been experiencing increase in frequency of heartburn lately. She had EGD on 03/27/2015 revealing normal esophageal mucosa moderate to large sliding hiatal hernia and gastric polyps which on biopsy were hyperplastic. She has been watching her diet. She denies hoarseness chronic cough or sore throat. She has not had an episode of food impaction. She denies skin rash fever or chills. She was begun on clindamycin for dental problems on 09/23/2015. No history of other anti-biotics or NSAID use. She denies abdominal pain melena or rectal bleeding. She was begun on metformin about 3 months ago for diabetes mellitus. She is trying to lose weight. She has lost 7 pounds since March this year.    Current Medications: Outpatient Encounter Prescriptions as of 09/28/2015  Medication Sig  . aspirin EC 81 MG tablet Take 81 mg by mouth every morning.  . calcium carbonate (OS-CAL) 600 MG TABS tablet Take 1,200 mg by mouth 2 (two) times daily with a meal.  . Cholecalciferol (VITAMIN D) 2000 UNITS CAPS Take 1 capsule by mouth daily.    . clindamycin (CLEOCIN) 300 MG capsule TK ONE C PO QID FOR 10 DAYS  . diazepam (VALIUM) 5 MG tablet Take 5 mg by mouth every 8 (eight) hours as needed for anxiety.   . gabapentin (NEURONTIN) 300 MG capsule Take 300 mg by mouth at bedtime.   . hydrochlorothiazide (HYDRODIURIL) 25 MG tablet Take 12.5 mg by mouth daily.  . metFORMIN (GLUCOPHAGE) 500 MG tablet TK 1 T PO  BID  . montelukast (SINGULAIR) 10 MG tablet Take 10 mg by mouth every morning.   . Multiple Vitamin (MULTIVITAMIN) tablet Take 1 tablet by mouth daily.  . nitroGLYCERIN (NITROSTAT) 0.4 MG SL tablet Place 1 tablet (0.4 mg total) under the tongue every 5  (five) minutes as needed for chest pain.  Marland Kitchen omeprazole (PRILOSEC) 20 MG capsule Take 2 capsules (40 mg total) by mouth daily.  . [DISCONTINUED] albuterol (PROVENTIL HFA;VENTOLIN HFA) 108 (90 BASE) MCG/ACT inhaler Inhale 2 puffs into the lungs every 6 (six) hours as needed for wheezing or shortness of breath.  . [DISCONTINUED] Biotin 1000 MCG tablet Take 5,000 mcg by mouth every evening.   . [DISCONTINUED] ferrous sulfate 325 (65 FE) MG tablet Take 325 mg by mouth daily with breakfast.  . [DISCONTINUED] omeprazole (PRILOSEC) 20 MG capsule TAKE 1 CAPSULE BY MOUTH TWICE DAILY BEFORE A MEAL (Patient not taking: Reported on 09/28/2015)  . [DISCONTINUED] omeprazole (PRILOSEC) 20 MG capsule TAKE 1 CAPSULE BY MOUTH TWICE DAILY BEFORE A MEAL  (Patient not taking: Reported on 09/28/2015)  . [DISCONTINUED] Probiotic Product (PROBIOTIC FORMULA) CAPS Take 1 capsule by mouth daily.   No facility-administered encounter medications on file as of 09/28/2015.     Objective: Blood pressure 140/80, pulse 70, temperature 97.9 F (36.6 C), temperature source Oral, resp. rate 18, height 5' 2.5" (1.588 m), weight 206 lb 9.6 oz (93.713 kg). Patient is alert and in no acute distress. Conjunctiva is pink. Sclera is nonicteric Oropharyngeal mucosa is normal. No neck masses or thyromegaly noted. Cardiac exam with regular rhythm normal S1 and S2. No murmur or gallop noted. Lungs are clear to auscultation. Abdomen is full but soft and nontender without organomegaly or masses.  No LE edema or clubbing noted.  Assessment:  #1. Recent onset of odynophagia most likely secondary to Candida esophagitis triggered by use of clindamycin and less likely due to esophagitis caused by clindamycin. #2. Chronic GERD. Heartburn has been well controlled with therapy. Doubt but odynophagia is due to gastroesophageal reflux disease since her heartburn is well controlled with therapy and no esophagitis noted on EGD of 6 months ago. #3.  History of of heme positive stool. Hemoglobin on 07/06/2015 was 12 g.   Plan:  Mycostatin suspension 500,000 units swish and swallow 4 times a day for 12 days. Patient will have CBC and Hemoccults later this month as planned. Patient will call office with progress report in 2 days. If odynophagia persists will proceed with diagnostic EGD. Patient advised to eat yogurt once or twice daily while on clindamycin and should call if she develops diarrhea. OV in six months.

## 2015-09-28 NOTE — Patient Instructions (Signed)
Call office with progress report in 48 hours. If painful swallowing does not improve with Mycostatin will plan esophagogastroduodenoscopy. Eat yogurt once or twice daily while on clindamycin. Call if he have diarrhea lasting more than 24 hours

## 2015-10-15 DIAGNOSIS — K921 Melena: Secondary | ICD-10-CM | POA: Diagnosis not present

## 2015-10-16 LAB — CBC
HCT: 33.9 % — ABNORMAL LOW (ref 36.0–46.0)
Hemoglobin: 10.8 g/dL — ABNORMAL LOW (ref 12.0–15.0)
MCH: 24.7 pg — ABNORMAL LOW (ref 26.0–34.0)
MCHC: 31.9 g/dL (ref 30.0–36.0)
MCV: 77.4 fL — AB (ref 78.0–100.0)
MPV: 9.7 fL (ref 8.6–12.4)
PLATELETS: 300 10*3/uL (ref 150–400)
RBC: 4.38 MIL/uL (ref 3.87–5.11)
RDW: 15.6 % — AB (ref 11.5–15.5)
WBC: 6.8 10*3/uL (ref 4.0–10.5)

## 2015-10-27 ENCOUNTER — Telehealth (INDEPENDENT_AMBULATORY_CARE_PROVIDER_SITE_OTHER): Payer: Self-pay | Admitting: *Deleted

## 2015-10-27 NOTE — Telephone Encounter (Signed)
   Diagnosis:    Result(s)   Card 1: Positive:            Completed by:    HEMOCCULT SENSA DEVELOPER: LOT#:[id number]   EXPIRATION DATE: 9-17   HEMOCCULT SENSA CARD:  LOT#:  02/14 EXPIRATION DATE: 07/18   CARD CONTROL RESULTS:  POSITIVE: Postiive NEGATIVE: Negative    ADDITIONAL COMMENTS: Patient was called ,message was left on her voicemail with result.

## 2015-10-29 NOTE — Telephone Encounter (Signed)
Talked with patient earlier today. Stool heme positive. H/H has dropped. Will schedule Given capsule study.

## 2015-10-30 ENCOUNTER — Other Ambulatory Visit (INDEPENDENT_AMBULATORY_CARE_PROVIDER_SITE_OTHER): Payer: Self-pay | Admitting: *Deleted

## 2015-10-30 DIAGNOSIS — R195 Other fecal abnormalities: Secondary | ICD-10-CM

## 2015-10-30 NOTE — Telephone Encounter (Signed)
Forwarded to Ann. 

## 2015-10-30 NOTE — Telephone Encounter (Signed)
GIVENS sch'd 11/04/15, patient aware

## 2015-10-30 NOTE — Telephone Encounter (Signed)
[name] - per Dr.Rehman the patient will need to have a Given Capsule.

## 2015-11-04 ENCOUNTER — Ambulatory Visit (HOSPITAL_COMMUNITY)
Admission: RE | Admit: 2015-11-04 | Discharge: 2015-11-04 | Disposition: A | Discharge status: Home / Self Care | Payer: Medicare Other | Source: Ambulatory Visit | Attending: Internal Medicine | Admitting: Internal Medicine

## 2015-11-04 ENCOUNTER — Encounter (HOSPITAL_COMMUNITY): Payer: Self-pay | Admitting: *Deleted

## 2015-11-04 ENCOUNTER — Encounter (HOSPITAL_COMMUNITY)
Admission: RE | Disposition: A | Discharge status: Home / Self Care | Payer: Self-pay | Source: Ambulatory Visit | Attending: Internal Medicine

## 2015-11-04 DIAGNOSIS — Z79899 Other long term (current) drug therapy: Secondary | ICD-10-CM | POA: Insufficient documentation

## 2015-11-04 DIAGNOSIS — D509 Iron deficiency anemia, unspecified: Secondary | ICD-10-CM | POA: Insufficient documentation

## 2015-11-04 DIAGNOSIS — F419 Anxiety disorder, unspecified: Secondary | ICD-10-CM | POA: Diagnosis not present

## 2015-11-04 DIAGNOSIS — R195 Other fecal abnormalities: Secondary | ICD-10-CM | POA: Insufficient documentation

## 2015-11-04 HISTORY — PX: GIVENS CAPSULE STUDY: SHX5432

## 2015-11-04 SURGERY — IMAGING PROCEDURE, GI TRACT, INTRALUMINAL, VIA CAPSULE

## 2015-11-04 MED ORDER — SODIUM CHLORIDE 0.9 % IV SOLN: INTRAVENOUS | Status: DC

## 2015-11-04 NOTE — H&P (Signed)
[name] is an 69 y.o. female.   Chief Complaint: Patient is here for small bowel given capsule study. HPI: Patient is 69 year old Caucasian female who has history of iron deficiency anemia and heme positive stool. She underwent EGD and colonoscopy in April this year. She had moderate to large sliding hiatal hernia with single erosion and the herniated part of stomach and hyperplastic polyps. 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 She was recently noted have drop in her H&H in Hemoccults are positive. She is therefore returning for small bowel given capsule study to complete her evaluation. She does not take OTC NSAIDs.  Past Medical History  Diagnosis Date  . Irregular heart beat   . Asthma   . GERD (gastroesophageal reflux disease)   . Arthritis   . Anginal pain ([facility])     cardiac clearance on the chart-pt also has GI promblems  . Murmur      Since Birth followed by Dr Verl Blalock.  . H/O hiatal hernia     'Sliding"  . 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 in left ear.  Left  side of body weaker than right.  Carpal tunnel bil .  Marland Kitchen Carpal tunnel syndrome     bilateral- wears splints  . Ulcer     gastric  . Anemia     takes iron  . Chronic anxiety   . Chest pain 01/05/2015    MI ruled out.    Past Surgical History  Procedure Laterality Date  . Appendectomy    . Cholecystectomy    . Neck surgery      x 2   . Knee arthroscopy      Left  . Esophagogastroduodenoscopy  12/08/2011    Procedure: ESOPHAGOGASTRODUODENOSCOPY (EGD);  Surgeon: Rogene Houston, MD;  [facility]: [facility];  Service: Endoscopy;  Laterality: N/A;  3:00   . Colonoscopy    . Upper gastrointestinal endoscopy    . Hysterscopy    . Total knee arthroplasty  07/11/2012    Procedure: TOTAL KNEE ARTHROPLASTY;  Surgeon: Ninetta Lights, MD;  Location: [facility];  Service: Orthopedics;  Laterality: Left;  left total knee arthroplasty  .  Tonsillectomy    . Eye surgery Bilateral     cataract surgery  . Colonoscopy N/A 09/26/2013    Procedure: COLONOSCOPY;  Surgeon: Rogene Houston, MD;  [facility]: [facility];  Service: Endoscopy;  Laterality: N/A;  1030-rescheduled to 12:00pm Ann notified pt  . Colonoscopy N/A 03/27/2015    Procedure: COLONOSCOPY;  Surgeon: Rogene Houston, MD;  [facility]: [facility];  Service: Endoscopy;  Laterality: N/A;  11:15 - moved to 8:25 - Ann to notify pt  . Esophagogastroduodenoscopy N/A 03/27/2015    Procedure: ESOPHAGOGASTRODUODENOSCOPY (EGD);  Surgeon: Rogene Houston, MD;  [facility]: [facility];  Service: Endoscopy;  Laterality: N/A;    Family History  Problem Relation Age of Onset  . Colon cancer Neg Hx   . Hypertension Son   . Alzheimer's disease Mother   . Kidney disease Mother   . Kidney disease Father   . Cancer Maternal Aunt     leukemia  . Liver disease Maternal Uncle   . Kidney disease Paternal Uncle     kidney removed  . Diabetes Maternal Grandmother   . Diabetes Maternal Grandfather   . Cancer Paternal Grandmother     breast, liver   Social History:  reports that  she has never smoked. She has never used smokeless tobacco. She reports that she does not drink alcohol or use illicit drugs.  Allergies:  Allergies  Allergen Reactions  . Nsaids Other (See Comments)    Caused Ulcers.  . Cephalexin     unknown  . Darvocet [Propoxyphene N-Acetaminophen]     nausea  . Percocet [Oxycodone-Acetaminophen] Nausea Only  . Penicillins Rash and Other (See Comments)    Redness    No prescriptions prior to admission    No results found for this or any previous visit (from the past 48 hour(s)). No results found.  ROS  There were no vitals taken for this visit. Physical Exam   Assessment/Plan Recurrent iron deficiency anemia and heme positive stool. Small bowel given capsule study.   Coren Sagan U 11/04/2015, 4:08 PM

## 2015-11-05 ENCOUNTER — Encounter (HOSPITAL_COMMUNITY): Payer: Self-pay | Admitting: Internal Medicine

## 2015-11-06 NOTE — Op Note (Signed)
Small Bowel [place] Capsule Study Procedure date:  11/04/2015  Referring Provider:  Purvis Kilts, MD PCP:  Dr. Hilma Favors, Betsy Coder, MD  Indication for procedure:   Patient is 69 year old Caucasian female was history of iron deficiency anemia and GI bleed who underwent EGD and colonoscopy in April this year. She was noted to have drop in her hemoglobin. She was also noted to have heme-positive stool. She is therefore returning for small bowel given capsule study to complete GI evaluation.    Findings:   Patient was able to swallow Given capsule without any difficulty. Gastritis with erosions noted without stigmata of bleed. Erosion noted in distal small bowel best seen on frame at 3:13:48.   First Gastric image:  34 sec First Duodenal image: 12 min and 38 sec First Ileo-Cecal Valve image: 4 hours 25 min and 19 sec First Cecal image: 4 hrs 25 min and 20 sec Gastric Passage time: 12 min Small Bowel Passage time: 4 hrs and 11 min  Summary & Recommendations: This study reveals single ileal erosion along with erosive gastritis which may or may not be the source of occult GI bleed and iron deficiency anemia. Patient advised to resume ferrous sulfate 325 mg by mouth twice a day. Patient advised to call if she has rectal bleeding or melena. She will have CBC in 1 month.

## 2015-11-10 ENCOUNTER — Encounter (INDEPENDENT_AMBULATORY_CARE_PROVIDER_SITE_OTHER): Payer: Self-pay | Admitting: *Deleted

## 2015-11-10 DIAGNOSIS — D509 Iron deficiency anemia, unspecified: Secondary | ICD-10-CM | POA: Diagnosis not present

## 2015-11-10 DIAGNOSIS — K2960 Other gastritis without bleeding: Secondary | ICD-10-CM | POA: Diagnosis not present

## 2015-11-10 DIAGNOSIS — Z9889 Other specified postprocedural states: Secondary | ICD-10-CM

## 2015-11-10 DIAGNOSIS — R195 Other fecal abnormalities: Secondary | ICD-10-CM | POA: Diagnosis not present

## 2015-11-10 DIAGNOSIS — K633 Ulcer of intestine: Secondary | ICD-10-CM | POA: Diagnosis not present

## 2015-12-07 ENCOUNTER — Telehealth (INDEPENDENT_AMBULATORY_CARE_PROVIDER_SITE_OTHER): Payer: Self-pay | Admitting: *Deleted

## 2015-12-07 DIAGNOSIS — K921 Melena: Secondary | ICD-10-CM

## 2015-12-07 NOTE — Telephone Encounter (Signed)
   Diagnosis:    Result(s)   Card 1: Positive:  :          Completed by: Thomas Hoff , LPN   HEMOCCULT SENSA DEVELOPER: LOT#: [id number]  EXPIRATION DATE: 9-17   HEMOCCULT SENSA CARD:  LOT#:  02/14 EXPIRATION DATE: 07/18   CARD CONTROL RESULTS:  POSITIVE: Positive NEGATIVE: Negative    ADDITIONAL COMMENTS: Patient was made aware of the results.

## 2015-12-07 NOTE — Telephone Encounter (Signed)
Per Dr. Laural Golden the patient is to have CBC.

## 2015-12-08 LAB — CBC
HEMATOCRIT: 37.4 % (ref 36.0–46.0)
HEMOGLOBIN: 12 g/dL (ref 12.0–15.0)
MCH: 25.8 pg — AB (ref 26.0–34.0)
MCHC: 32.1 g/dL (ref 30.0–36.0)
MCV: 80.3 fL (ref 78.0–100.0)
MPV: 9.9 fL (ref 8.6–12.4)
Platelets: 284 10*3/uL (ref 150–400)
RBC: 4.66 MIL/uL (ref 3.87–5.11)
RDW: 21.2 % — ABNORMAL HIGH (ref 11.5–15.5)
WBC: 7.4 10*3/uL (ref 4.0–10.5)

## 2015-12-08 NOTE — Telephone Encounter (Signed)
Results of recent EGD and EUS noted. Cytology is negative and gastric biopsies negative for H. pylori infection. CEA on fluid is still pending. Will proceed with HIDA scan with CCK ASAP Patient's daughter Carlyon Shadow called and message left on her answering service

## 2015-12-09 ENCOUNTER — Telehealth (INDEPENDENT_AMBULATORY_CARE_PROVIDER_SITE_OTHER): Payer: Self-pay | Admitting: *Deleted

## 2015-12-09 DIAGNOSIS — R195 Other fecal abnormalities: Secondary | ICD-10-CM

## 2015-12-09 NOTE — Telephone Encounter (Signed)
Patient was called and given results and made aware of lab work and another hemoccult card in 1 month. A letter will be sent to her as a reminder.

## 2015-12-09 NOTE — Telephone Encounter (Signed)
Per Dr.Rehman the patient will need to have labs drawn in 1 month , and she will need to have a hemoccult card.

## 2015-12-14 ENCOUNTER — Other Ambulatory Visit (INDEPENDENT_AMBULATORY_CARE_PROVIDER_SITE_OTHER): Payer: Self-pay | Admitting: *Deleted

## 2015-12-14 ENCOUNTER — Encounter (INDEPENDENT_AMBULATORY_CARE_PROVIDER_SITE_OTHER): Payer: Self-pay | Admitting: *Deleted

## 2015-12-14 DIAGNOSIS — R195 Other fecal abnormalities: Secondary | ICD-10-CM

## 2016-01-12 DIAGNOSIS — R195 Other fecal abnormalities: Secondary | ICD-10-CM | POA: Diagnosis not present

## 2016-01-13 LAB — CBC
HEMATOCRIT: 42 % (ref 36.0–46.0)
Hemoglobin: 13.3 g/dL (ref 12.0–15.0)
MCH: 26.8 pg (ref 26.0–34.0)
MCHC: 31.7 g/dL (ref 30.0–36.0)
MCV: 84.7 fL (ref 78.0–100.0)
MPV: 10.1 fL (ref 8.6–12.4)
Platelets: 281 10*3/uL (ref 150–400)
RBC: 4.96 MIL/uL (ref 3.87–5.11)
RDW: 20.1 % — AB (ref 11.5–15.5)
WBC: 8.2 10*3/uL (ref 4.0–10.5)

## 2016-01-18 DIAGNOSIS — Z6835 Body mass index (BMI) 35.0-35.9, adult: Secondary | ICD-10-CM | POA: Diagnosis not present

## 2016-01-18 DIAGNOSIS — E1165 Type 2 diabetes mellitus with hyperglycemia: Secondary | ICD-10-CM | POA: Diagnosis not present

## 2016-01-18 DIAGNOSIS — I10 Essential (primary) hypertension: Secondary | ICD-10-CM | POA: Diagnosis not present

## 2016-01-18 DIAGNOSIS — Z1389 Encounter for screening for other disorder: Secondary | ICD-10-CM | POA: Diagnosis not present

## 2016-01-22 ENCOUNTER — Telehealth (INDEPENDENT_AMBULATORY_CARE_PROVIDER_SITE_OTHER): Payer: Self-pay | Admitting: *Deleted

## 2016-01-22 NOTE — Telephone Encounter (Signed)
   Diagnosis:    Result(s)   Card 1: Negative:     Card 2: Negative:       Completed by:    HEMOCCULT SENSA DEVELOPER: LOT#[id number]  EXPIRATION DATE: 9-17   HEMOCCULT SENSA CARD:  LOT#:  02/14 EXPIRATION DATE: 07/18   CARD CONTROL RESULTS:  POSITIVE: Positive NEGATIVE: Negative    ADDITIONAL COMMENTS: Patient was called and made aware.

## 2016-01-22 NOTE — Telephone Encounter (Signed)
Nystatin note to that both Hemoccults are negative

## 2016-02-02 ENCOUNTER — Other Ambulatory Visit (INDEPENDENT_AMBULATORY_CARE_PROVIDER_SITE_OTHER): Payer: Self-pay | Admitting: Internal Medicine

## 2016-02-08 DIAGNOSIS — Z23 Encounter for immunization: Secondary | ICD-10-CM | POA: Diagnosis not present

## 2016-02-08 DIAGNOSIS — E119 Type 2 diabetes mellitus without complications: Secondary | ICD-10-CM | POA: Diagnosis not present

## 2016-02-08 DIAGNOSIS — Z1389 Encounter for screening for other disorder: Secondary | ICD-10-CM | POA: Diagnosis not present

## 2016-02-08 DIAGNOSIS — E6609 Other obesity due to excess calories: Secondary | ICD-10-CM | POA: Diagnosis not present

## 2016-02-08 DIAGNOSIS — Z6834 Body mass index (BMI) 34.0-34.9, adult: Secondary | ICD-10-CM | POA: Diagnosis not present

## 2016-02-08 DIAGNOSIS — I10 Essential (primary) hypertension: Secondary | ICD-10-CM | POA: Diagnosis not present

## 2016-02-08 DIAGNOSIS — Z0000 Encounter for general adult medical examination without abnormal findings: Secondary | ICD-10-CM | POA: Diagnosis not present

## 2016-02-09 DIAGNOSIS — E6609 Other obesity due to excess calories: Secondary | ICD-10-CM | POA: Diagnosis not present

## 2016-02-09 DIAGNOSIS — T50Z95A Adverse effect of other vaccines and biological substances, initial encounter: Secondary | ICD-10-CM | POA: Diagnosis not present

## 2016-02-09 DIAGNOSIS — Z6834 Body mass index (BMI) 34.0-34.9, adult: Secondary | ICD-10-CM | POA: Diagnosis not present

## 2016-02-15 IMAGING — CR DG CHEST 2V
2 series · 2 of 2 positions shown · non-contrast
Comparison: 12/25/2014

CLINICAL DATA: Chest pain since this morning.

EXAM:
CHEST  2 VIEW

[view not recorded (1 of 2)]
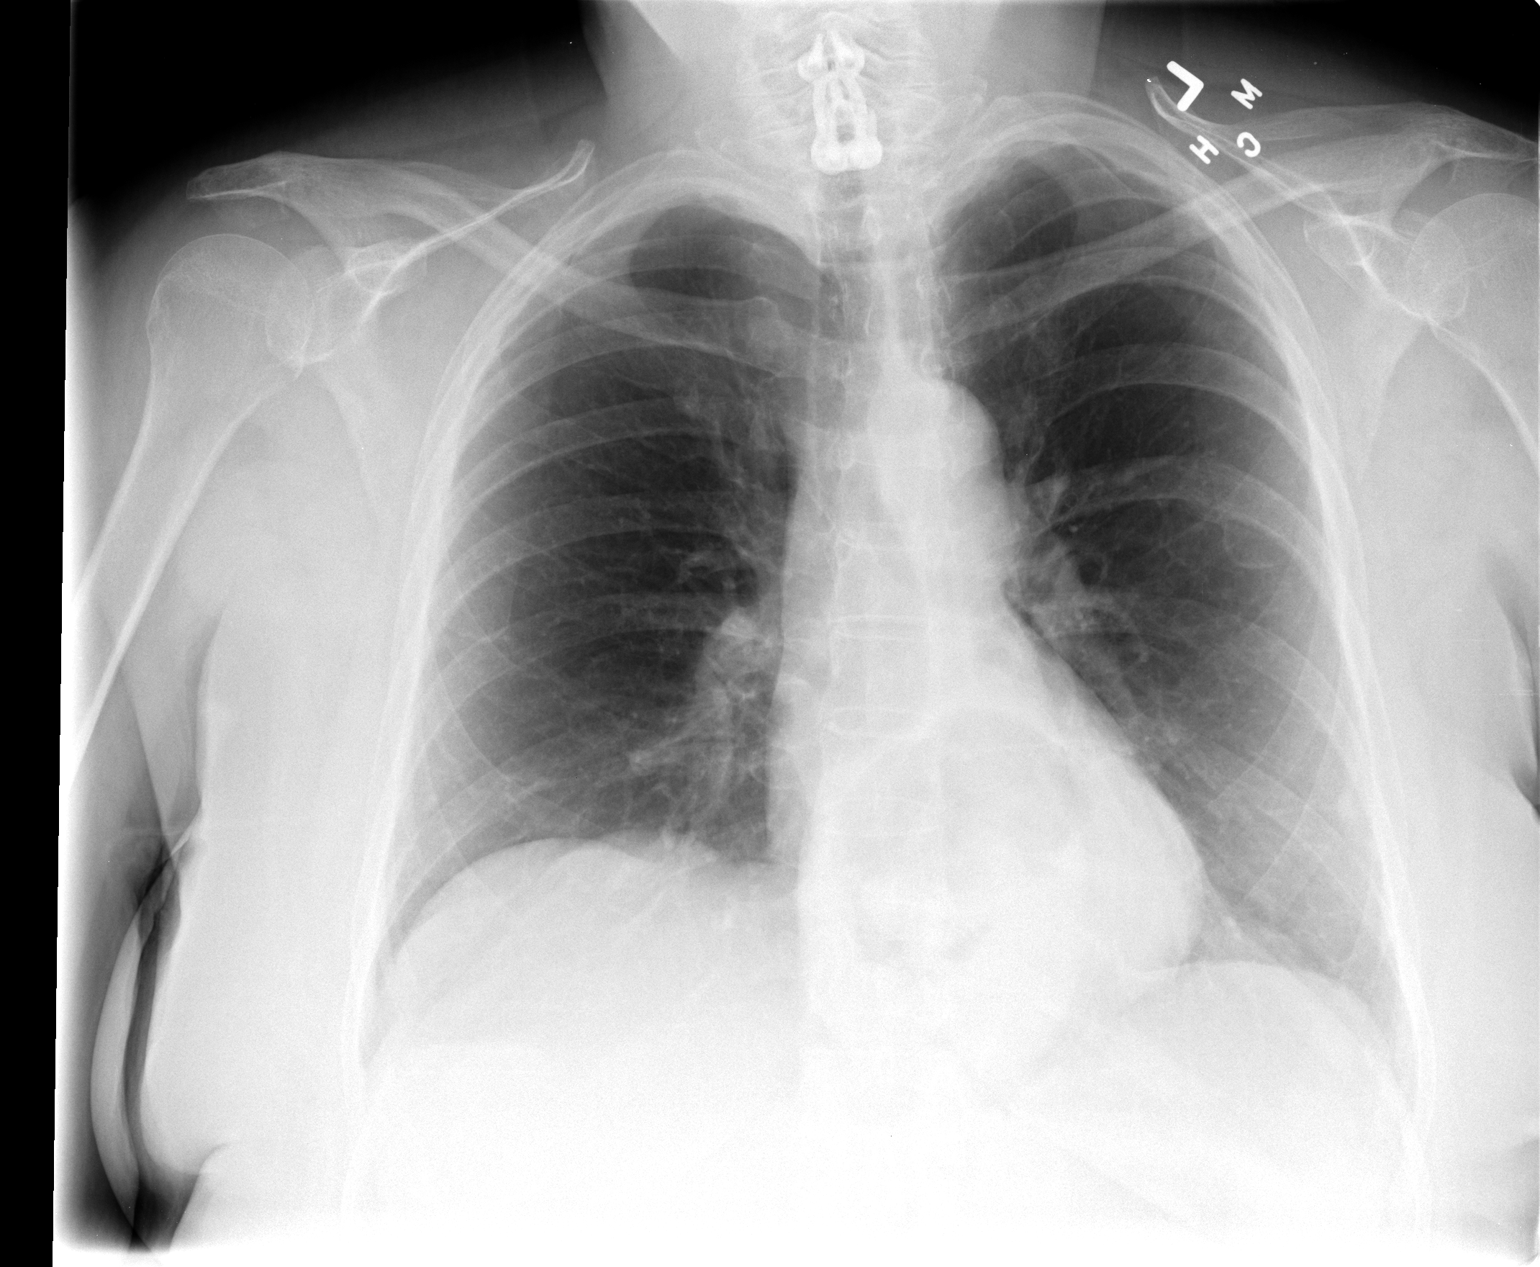

[view not recorded (2 of 2)]
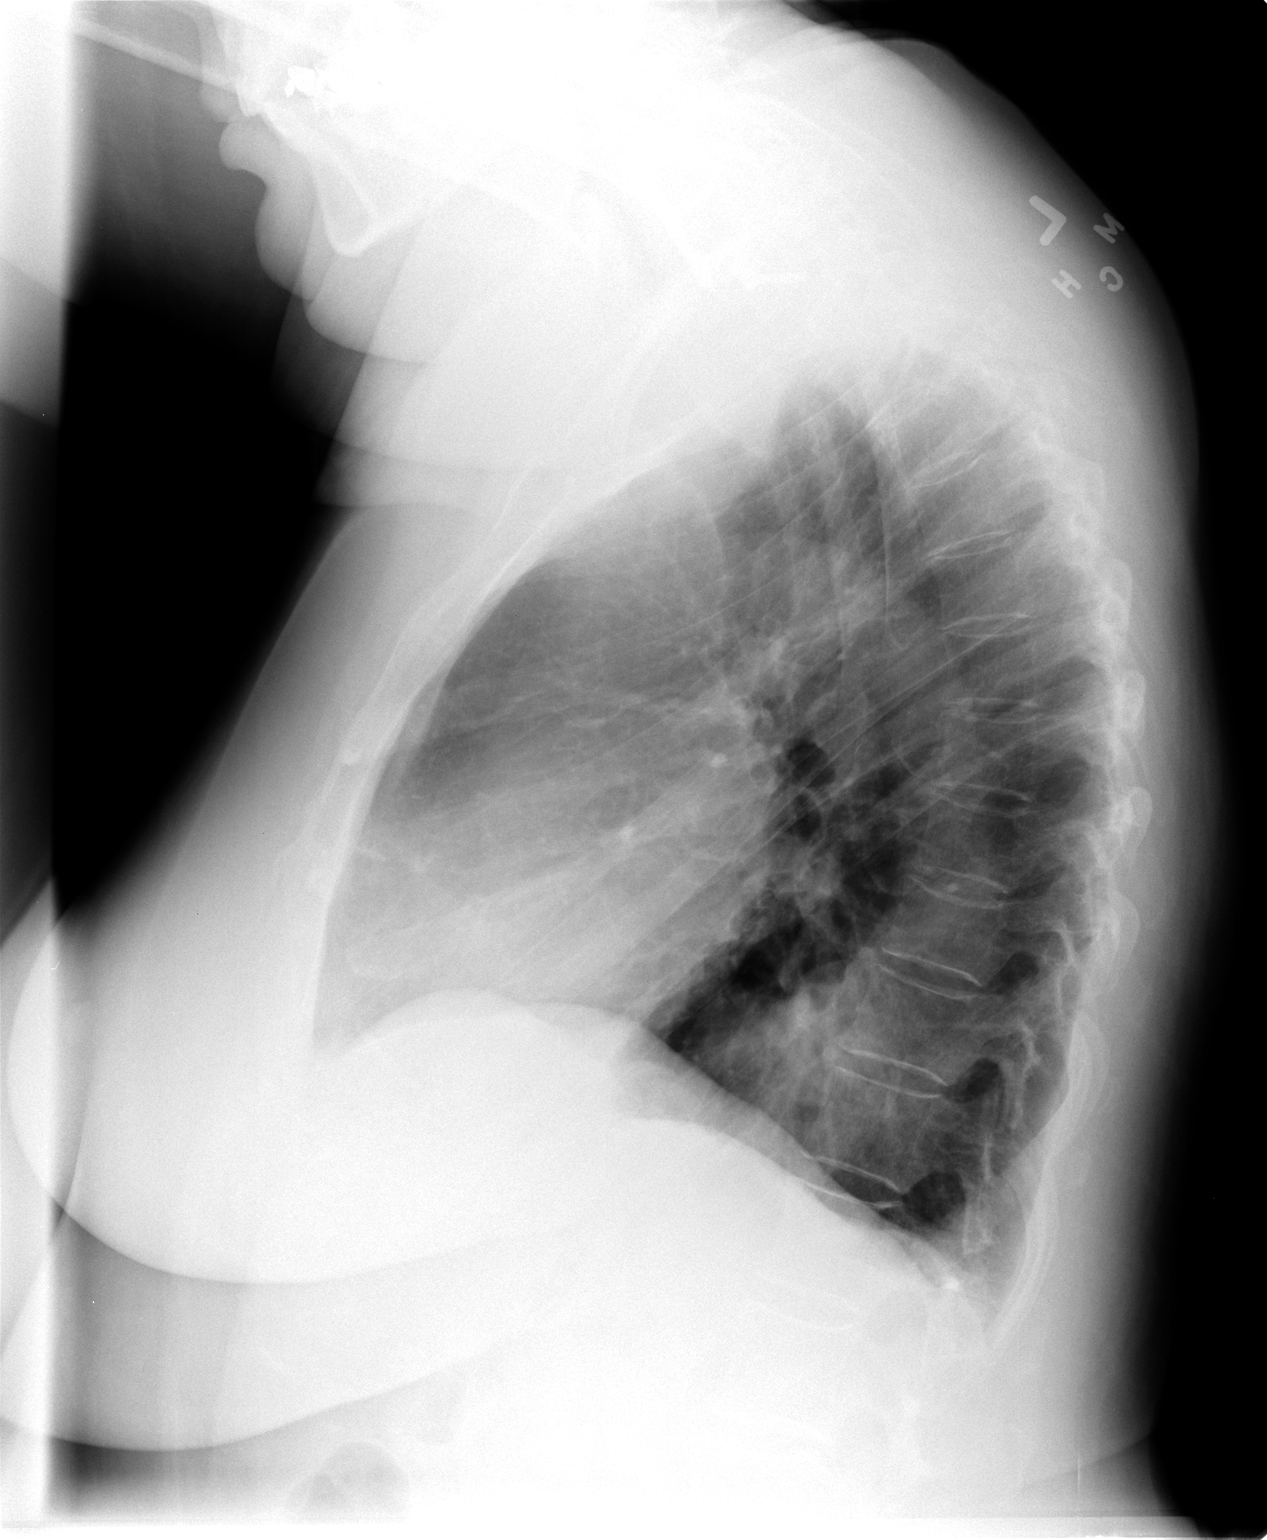

[2 of 2 positions shown; findings below may reference images not displayed]

FINDINGS: Moderate-sized hiatal hernia. Heart is normal size. Probable
calcified granuloma peripherally in the left lung base. Lungs
otherwise clear. No effusions. No acute bony abnormality.
IMPRESSION: No active cardiopulmonary disease.

## 2016-02-16 ENCOUNTER — Other Ambulatory Visit (HOSPITAL_COMMUNITY): Payer: Self-pay | Admitting: Family Medicine

## 2016-02-16 DIAGNOSIS — Z1389 Encounter for screening for other disorder: Secondary | ICD-10-CM | POA: Diagnosis not present

## 2016-02-16 DIAGNOSIS — Z0000 Encounter for general adult medical examination without abnormal findings: Secondary | ICD-10-CM | POA: Diagnosis not present

## 2016-02-16 DIAGNOSIS — Z23 Encounter for immunization: Secondary | ICD-10-CM | POA: Diagnosis not present

## 2016-02-16 DIAGNOSIS — Z1231 Encounter for screening mammogram for malignant neoplasm of breast: Secondary | ICD-10-CM

## 2016-02-16 DIAGNOSIS — E119 Type 2 diabetes mellitus without complications: Secondary | ICD-10-CM | POA: Diagnosis not present

## 2016-02-24 ENCOUNTER — Ambulatory Visit (HOSPITAL_COMMUNITY)
Admission: RE | Admit: 2016-02-24 | Discharge: 2016-02-24 | Disposition: A | Discharge status: Home / Self Care | Payer: Medicare Other | Source: Ambulatory Visit | Attending: Family Medicine | Admitting: Family Medicine

## 2016-02-24 DIAGNOSIS — Z1231 Encounter for screening mammogram for malignant neoplasm of breast: Secondary | ICD-10-CM | POA: Diagnosis not present

## 2016-02-24 DIAGNOSIS — R928 Other abnormal and inconclusive findings on diagnostic imaging of breast: Secondary | ICD-10-CM | POA: Diagnosis not present

## 2016-02-26 ENCOUNTER — Other Ambulatory Visit: Payer: Self-pay | Admitting: Family Medicine

## 2016-02-26 DIAGNOSIS — R928 Other abnormal and inconclusive findings on diagnostic imaging of breast: Secondary | ICD-10-CM

## 2016-03-02 ENCOUNTER — Other Ambulatory Visit (HOSPITAL_COMMUNITY): Payer: Self-pay | Admitting: Family Medicine

## 2016-03-02 DIAGNOSIS — R928 Other abnormal and inconclusive findings on diagnostic imaging of breast: Secondary | ICD-10-CM

## 2016-03-08 ENCOUNTER — Ambulatory Visit (HOSPITAL_COMMUNITY)
Admission: RE | Admit: 2016-03-08 | Discharge: 2016-03-08 | Disposition: A | Discharge status: Home / Self Care | Payer: Medicare Other | Source: Ambulatory Visit | Attending: Family Medicine | Admitting: Family Medicine

## 2016-03-08 DIAGNOSIS — R928 Other abnormal and inconclusive findings on diagnostic imaging of breast: Secondary | ICD-10-CM

## 2016-03-08 DIAGNOSIS — N6002 Solitary cyst of left breast: Secondary | ICD-10-CM | POA: Diagnosis not present

## 2016-03-08 DIAGNOSIS — N6012 Diffuse cystic mastopathy of left breast: Secondary | ICD-10-CM | POA: Diagnosis not present

## 2016-03-08 DIAGNOSIS — N63 Unspecified lump in breast: Secondary | ICD-10-CM | POA: Diagnosis not present

## 2016-03-15 ENCOUNTER — Encounter (HOSPITAL_COMMUNITY): Payer: Medicare Other

## 2016-03-28 ENCOUNTER — Encounter (INDEPENDENT_AMBULATORY_CARE_PROVIDER_SITE_OTHER): Payer: Self-pay | Admitting: Internal Medicine

## 2016-03-28 ENCOUNTER — Ambulatory Visit (INDEPENDENT_AMBULATORY_CARE_PROVIDER_SITE_OTHER): Payer: Medicare Other | Admitting: Internal Medicine

## 2016-03-28 VITALS — BP 140/70 | HR 76 | Temp 98.1°F | Ht 62.5 in | Wt 188.4 lb

## 2016-03-28 DIAGNOSIS — E119 Type 2 diabetes mellitus without complications: Secondary | ICD-10-CM | POA: Insufficient documentation

## 2016-03-28 DIAGNOSIS — R195 Other fecal abnormalities: Secondary | ICD-10-CM

## 2016-03-28 NOTE — Progress Notes (Signed)
Subjective:    Patient ID: [name], female    DOB: 02/18/1946, 70 y.o.   MRN: [id number]  HPI Here today for f/u of heme positive stool. She underwent an EGD/Colonoscopy in April of last year and a Given Capsule in November of last year. She tells me in general she is doing pretty. Two stool cards sent home with her in January were negative.  She occasionally has pain in her rt flank area.  Her appetite is good. She has had weight loss which was intentional. From 206.9 to 188.5 She has a BM daily or every day.  She has not seen any rectal bleeding.   CBC    Component Value Date/Time   WBC 8.2 01/12/2016 1454   RBC 4.96 01/12/2016 1454   HGB 13.3 01/12/2016 1454   HCT 42.0 01/12/2016 1454   PLT 281 01/12/2016 1454   MCV 84.7 01/12/2016 1454   MCH 26.8 01/12/2016 1454   MCHC 31.7 01/12/2016 1454   RDW 20.1* 01/12/2016 1454   LYMPHSABS 2.0 03/10/2015 1133   MONOABS 0.5 03/10/2015 1133   EOSABS 0.5 03/10/2015 1133   BASOSABS 0.0 03/10/2015 1133        11/04/2015 Givens Capsule \\Summary  & Recommendations: This study reveals single ileal erosion along with erosive gastritis which may or may not be the source of occult GI bleed and iron deficiency anemia. Patient advised to resume ferrous sulfate 325 mg by mouth twice a day. Patient advised to call if she has rectal bleeding or melena. She will have CBC in 1 month.    03/27/2015 EGD & Colonoscopy  Indications: Patient is 70 year old Caucasian female was chronic GERD and history of peptic ulcer disease who was noted to have multiple heme-positive stools. Her H&H was normal. Last colonoscopy was less than 2 years ago.. She is undergoing diagnostic EGD and colonoscopy. EGD findings; No evidence of erosive esophagitis or peptic ulcer disease. Moderate to large sliding hiatal hernia with single erosion  involving herniated part of the stomach. Multiple gastric polyps and antrum ranging in size from 4-10 mm. 6 of these were biopsied for routine histology.  Colonoscopy findings; Examination performed to cecum. 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 Small external hemorrhoids.     09/26/2013 Colonoscopy average risk:  Findings:  Prep excellent. Very tortuous colon. Slims cold used to complete exam. Normal mucosa throughout. Normal mucosa of rectum and anorectal junction except focal thickening of anoderm. Impression:  Normal colonoscopy except tortuous colon.    Review of Systems Past Medical History  Diagnosis Date  . Irregular heart beat   . Asthma   . GERD (gastroesophageal reflux disease)   . Arthritis   . Anginal pain (HCC)     cardiac clearance on the chart-pt also has GI promblems  . Murmur      Since Birth followed by Dr Verl Blalock.  . H/O hiatal hernia     'Sliding"  . 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 in left ear.  Left  side of body weaker than right.  Carpal tunnel bil .  Marland Kitchen Carpal tunnel syndrome     bilateral- wears splints  . Ulcer     gastric  . Anemia     takes iron  . Chronic anxiety   . Chest pain 01/05/2015    MI ruled out.    Past Surgical History  Procedure  Laterality Date  . Appendectomy    . Cholecystectomy    . Neck surgery      x 2   . Knee arthroscopy      Left  . Esophagogastroduodenoscopy  12/08/2011    Procedure: ESOPHAGOGASTRODUODENOSCOPY (EGD);  Surgeon: Rogene Houston, MD;  [facility];  Service: Endoscopy;  Laterality: N/A;  3:00   . Colonoscopy    . Upper gastrointestinal endoscopy    . Hysterscopy    . Total knee arthroplasty  07/11/2012    Procedure: TOTAL KNEE ARTHROPLASTY;  Surgeon: Ninetta Lights, MD;  Location: [facility];  Service: Orthopedics;  Laterality: Left;  left  total knee arthroplasty  . Tonsillectomy    . Eye surgery Bilateral     cataract surgery  . Colonoscopy N/A 09/26/2013    Procedure: COLONOSCOPY;  Surgeon: Rogene Houston, MD;  [facility];  Service: Endoscopy;  Laterality: N/A;  1030-rescheduled to 12:00pm Ann notified pt  . Colonoscopy N/A 03/27/2015    Procedure: COLONOSCOPY;  Surgeon: Rogene Houston, MD;  [facility];  Service: Endoscopy;  Laterality: N/A;  11:15 - moved to 8:25 - Ann to notify pt  . Esophagogastroduodenoscopy N/A 03/27/2015    Procedure: ESOPHAGOGASTRODUODENOSCOPY (EGD);  Surgeon: Rogene Houston, MD;  [facility];  Service: Endoscopy;  Laterality: N/A;  . Givens capsule study N/A 11/04/2015    Procedure: GIVENS CAPSULE STUDY;  Surgeon: Rogene Houston, MD;  [facility];  Service: Endoscopy;  Laterality: N/A;    Allergies  Allergen Reactions  . Nsaids Other (See Comments)    Caused Ulcers.  . Cephalexin     unknown  . Darvocet [Propoxyphene N-Acetaminophen]     nausea  . Percocet [Oxycodone-Acetaminophen] Nausea Only  . Penicillins Rash and Other (See Comments)    Redness    Current Outpatient Prescriptions on File Prior to Visit  Medication Sig Dispense Refill  . aspirin EC 81 MG tablet Take 81 mg by mouth every morning.    . calcium carbonate (OS-CAL) 600 MG TABS tablet Take 1,200 mg by mouth 2 (two) times daily with a meal.    . Cholecalciferol (VITAMIN D) 2000 UNITS CAPS Take 1 capsule by mouth daily.      . diazepam (VALIUM) 5 MG tablet Take 5 mg by mouth every 8 (eight) hours as needed for anxiety.     . gabapentin (NEURONTIN) 300 MG capsule Take 300 mg by mouth at bedtime.     . hydrochlorothiazide (HYDRODIURIL) 25 MG tablet Take 12.5 mg by mouth daily.    . montelukast (SINGULAIR) 10 MG tablet Take 10 mg by mouth every morning.     . Multiple Vitamin (MULTIVITAMIN) tablet Take 1 tablet by mouth daily.    Marland Kitchen omeprazole (PRILOSEC) 20 MG capsule TAKE 1 CAPSULE  BY MOUTH TWICE DAILY BEFORE A MEAL 60 capsule 5   No current facility-administered medications on file prior to visit.        Objective:   Physical ExamBlood pressure 140/70, pulse 76, temperature 98.1 F (36.7 C), height 5' 2.5" (1.588 m), weight 188 lb 6.4 oz (85.458 kg). Alert and oriented. Skin warm and dry. Oral mucosa is moist.   . Sclera anicteric, conjunctivae is pink. Thyroid not enlarged. No cervical lymphadenopathy. Lungs clear. Heart regular rate and rhythm.  Abdomen is soft. Bowel sounds are positive. No hepatomegaly. No abdominal masses felt. No tenderness.  No edema to lower extremities.  Assessment & Plan:  Guaiac positive stool. Last stool specimen was negative. CBC today. OV in 6 months. CBC in 3 months.

## 2016-03-28 NOTE — Patient Instructions (Signed)
CBC today. OV in 6 months.  

## 2016-03-29 LAB — CBC WITH DIFFERENTIAL/PLATELET
BASOS PCT: 1 %
Basophils Absolute: 65 cells/uL (ref 0–200)
EOS ABS: 260 {cells}/uL (ref 15–500)
Eosinophils Relative: 4 %
HEMATOCRIT: 42.2 % (ref 35.0–45.0)
HEMOGLOBIN: 13.8 g/dL (ref 11.7–15.5)
LYMPHS ABS: 2015 {cells}/uL (ref 850–3900)
Lymphocytes Relative: 31 %
MCH: 28.6 pg (ref 27.0–33.0)
MCHC: 32.7 g/dL (ref 32.0–36.0)
MCV: 87.4 fL (ref 80.0–100.0)
MONO ABS: 585 {cells}/uL (ref 200–950)
MPV: 10 fL (ref 7.5–12.5)
Monocytes Relative: 9 %
NEUTROS PCT: 55 %
Neutro Abs: 3575 cells/uL (ref 1500–7800)
Platelets: 260 10*3/uL (ref 140–400)
RBC: 4.83 MIL/uL (ref 3.80–5.10)
RDW: 15.2 % — ABNORMAL HIGH (ref 11.0–15.0)
WBC: 6.5 10*3/uL (ref 3.8–10.8)

## 2016-03-30 ENCOUNTER — Telehealth (INDEPENDENT_AMBULATORY_CARE_PROVIDER_SITE_OTHER): Payer: Self-pay | Admitting: *Deleted

## 2016-03-30 DIAGNOSIS — D508 Other iron deficiency anemias: Secondary | ICD-10-CM

## 2016-03-30 DIAGNOSIS — K921 Melena: Secondary | ICD-10-CM

## 2016-03-30 NOTE — Telephone Encounter (Signed)
.  Per Lelon Perla patient will need to have lab in 3 months.

## 2016-06-16 ENCOUNTER — Encounter (INDEPENDENT_AMBULATORY_CARE_PROVIDER_SITE_OTHER): Payer: Self-pay | Admitting: *Deleted

## 2016-06-16 ENCOUNTER — Other Ambulatory Visit (INDEPENDENT_AMBULATORY_CARE_PROVIDER_SITE_OTHER): Payer: Self-pay | Admitting: *Deleted

## 2016-06-16 DIAGNOSIS — K921 Melena: Secondary | ICD-10-CM

## 2016-06-16 DIAGNOSIS — D508 Other iron deficiency anemias: Secondary | ICD-10-CM

## 2016-06-21 DIAGNOSIS — Z6833 Body mass index (BMI) 33.0-33.9, adult: Secondary | ICD-10-CM | POA: Diagnosis not present

## 2016-06-21 DIAGNOSIS — E119 Type 2 diabetes mellitus without complications: Secondary | ICD-10-CM | POA: Diagnosis not present

## 2016-06-21 DIAGNOSIS — J4530 Mild persistent asthma, uncomplicated: Secondary | ICD-10-CM | POA: Diagnosis not present

## 2016-06-21 DIAGNOSIS — Z1389 Encounter for screening for other disorder: Secondary | ICD-10-CM | POA: Diagnosis not present

## 2016-06-21 DIAGNOSIS — I10 Essential (primary) hypertension: Secondary | ICD-10-CM | POA: Diagnosis not present

## 2016-06-21 DIAGNOSIS — E1140 Type 2 diabetes mellitus with diabetic neuropathy, unspecified: Secondary | ICD-10-CM | POA: Diagnosis not present

## 2016-07-11 DIAGNOSIS — K921 Melena: Secondary | ICD-10-CM | POA: Diagnosis not present

## 2016-07-11 DIAGNOSIS — D508 Other iron deficiency anemias: Secondary | ICD-10-CM | POA: Diagnosis not present

## 2016-07-12 LAB — CBC
HCT: 44.9 % (ref 35.0–45.0)
Hemoglobin: 14.6 g/dL (ref 11.7–15.5)
MCH: 29.9 pg (ref 27.0–33.0)
MCHC: 32.5 g/dL (ref 32.0–36.0)
MCV: 91.8 fL (ref 80.0–100.0)
MPV: 10.1 fL (ref 7.5–12.5)
PLATELETS: 257 10*3/uL (ref 140–400)
RBC: 4.89 MIL/uL (ref 3.80–5.10)
RDW: 14.5 % (ref 11.0–15.0)
WBC: 7.1 10*3/uL (ref 3.8–10.8)

## 2016-07-14 ENCOUNTER — Ambulatory Visit (INDEPENDENT_AMBULATORY_CARE_PROVIDER_SITE_OTHER): Payer: Medicare Other | Admitting: Otolaryngology

## 2016-07-14 DIAGNOSIS — H903 Sensorineural hearing loss, bilateral: Secondary | ICD-10-CM | POA: Diagnosis not present

## 2016-07-28 ENCOUNTER — Other Ambulatory Visit (INDEPENDENT_AMBULATORY_CARE_PROVIDER_SITE_OTHER): Payer: Self-pay | Admitting: Internal Medicine

## 2016-09-12 ENCOUNTER — Encounter (HOSPITAL_COMMUNITY): Payer: Self-pay | Admitting: Emergency Medicine

## 2016-09-12 ENCOUNTER — Emergency Department (HOSPITAL_COMMUNITY): Payer: Medicare Other

## 2016-09-12 ENCOUNTER — Emergency Department (HOSPITAL_COMMUNITY)
Admission: EM | Admit: 2016-09-12 | Discharge: 2016-09-12 | Disposition: A | Discharge status: Home / Self Care | Payer: Medicare Other | Attending: Emergency Medicine | Admitting: Emergency Medicine

## 2016-09-12 DIAGNOSIS — E119 Type 2 diabetes mellitus without complications: Secondary | ICD-10-CM | POA: Diagnosis not present

## 2016-09-12 DIAGNOSIS — Z7982 Long term (current) use of aspirin: Secondary | ICD-10-CM | POA: Insufficient documentation

## 2016-09-12 DIAGNOSIS — J45909 Unspecified asthma, uncomplicated: Secondary | ICD-10-CM | POA: Insufficient documentation

## 2016-09-12 DIAGNOSIS — Z79899 Other long term (current) drug therapy: Secondary | ICD-10-CM | POA: Insufficient documentation

## 2016-09-12 DIAGNOSIS — Z7984 Long term (current) use of oral hypoglycemic drugs: Secondary | ICD-10-CM | POA: Diagnosis not present

## 2016-09-12 DIAGNOSIS — I10 Essential (primary) hypertension: Secondary | ICD-10-CM | POA: Diagnosis not present

## 2016-09-12 DIAGNOSIS — R079 Chest pain, unspecified: Secondary | ICD-10-CM | POA: Diagnosis not present

## 2016-09-12 DIAGNOSIS — R0789 Other chest pain: Secondary | ICD-10-CM | POA: Insufficient documentation

## 2016-09-12 LAB — BASIC METABOLIC PANEL
ANION GAP: 8 (ref 5–15)
BUN: 25 mg/dL — ABNORMAL HIGH (ref 6–20)
CALCIUM: 9.5 mg/dL (ref 8.9–10.3)
CO2: 26 mmol/L (ref 22–32)
Chloride: 105 mmol/L (ref 101–111)
Creatinine, Ser: 0.86 mg/dL (ref 0.44–1.00)
Glucose, Bld: 124 mg/dL — ABNORMAL HIGH (ref 65–99)
Potassium: 3.4 mmol/L — ABNORMAL LOW (ref 3.5–5.1)
Sodium: 139 mmol/L (ref 135–145)

## 2016-09-12 LAB — I-STAT TROPONIN, ED: TROPONIN I, POC: 0.03 ng/mL (ref 0.00–0.08)

## 2016-09-12 LAB — CBC
HCT: 42.2 % (ref 36.0–46.0)
HEMOGLOBIN: 14 g/dL (ref 12.0–15.0)
MCH: 30.4 pg (ref 26.0–34.0)
MCHC: 33.2 g/dL (ref 30.0–36.0)
MCV: 91.5 fL (ref 78.0–100.0)
Platelets: 228 10*3/uL (ref 150–400)
RBC: 4.61 MIL/uL (ref 3.87–5.11)
RDW: 14.5 % (ref 11.5–15.5)
WBC: 8.3 10*3/uL (ref 4.0–10.5)

## 2016-09-12 LAB — TROPONIN I

## 2016-09-12 MED ORDER — LORAZEPAM 2 MG/ML IJ SOLN
0.5000 mg | Freq: Once | INTRAMUSCULAR | Status: AC
  Administered 2016-09-12: 0.5 mg via INTRAVENOUS
  Filled 2016-09-12: qty 1

## 2016-09-12 MED ORDER — FAMOTIDINE IN NACL 20-0.9 MG/50ML-% IV SOLN
20.0000 mg | Freq: Once | INTRAVENOUS | Status: AC
  Administered 2016-09-12: 20 mg via INTRAVENOUS
  Filled 2016-09-12: qty 50

## 2016-09-12 MED ORDER — TRAMADOL HCL 50 MG PO TABS: 50.0000 mg | ORAL_TABLET | Freq: Four times a day (QID) | ORAL | 0 refills | Status: DC | PRN

## 2016-09-12 NOTE — ED Provider Notes (Signed)
[facility] DEPT Provider Note   CSN: [id number] Arrival date & time: 09/12/16  1410     History   Chief Complaint Chief Complaint  Patient presents with  . Chest Pain    HPI [name] is a 70 y.o. female.  Patient complains of chest tightness across the upper part of her tests. No pain with inspiration   The history is provided by the patient. No language interpreter was used.  Chest Pain   This is a new problem. The current episode started 12 to 24 hours ago. The problem occurs constantly. The problem has not changed since onset.Associated with: Nothing. Pain location: Upper chest. The pain is at a severity of 3/10. The pain is moderate. The quality of the pain is described as heavy. The pain does not radiate. Exacerbated by: Nothing. Pertinent negatives include no abdominal pain, no back pain, no cough and no headaches. She has tried nothing for the symptoms. The treatment provided no relief.  Pertinent negatives for past medical history include no seizures.    Past Medical History:  Diagnosis Date  . Anemia    takes iron  . Anginal pain (HCC)    cardiac clearance on the chart-pt also has GI promblems  . Arthritis   . Asthma   . Carpal tunnel syndrome    bilateral- wears splints  . Chest pain 01/05/2015   MI ruled out.  . Chronic anxiety   . GERD (gastroesophageal reflux disease)   . H/O hiatal hernia    'Sliding"  . Irregular heart beat   . Murmur     Since Birth followed by Dr Verl Blalock.  . 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 in left ear.  Left  side of body weaker than right.  Carpal tunnel bil .  Marland Kitchen Ulcer    gastric    Patient Active Problem List   Diagnosis Date Noted  . Diabetes ([place]) 03/28/2016  . Pain in the chest   . GERD (gastroesophageal reflux disease)   . Chest pain 01/05/2015  . Neuropathy ([place]) 01/05/2015  . Bronchitis   . 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 Chest  pain at rest 11/14/2013  . Left knee pain 09/18/2013  . Salmonella 05/24/2013  . Thrombocytopenia, unspecified ([place]) 05/24/2013  . Acute gastroenteritis 05/21/2013  . Fever 05/21/2013  . Abnormal urinalysis 05/21/2013  . Hypokalemia 05/21/2013  . Dehydration 05/21/2013  . Coccydynia 05/07/2013  . Anemia 08/06/2012  . PUD (peptic ulcer disease) 02/06/2012  . OVERWEIGHT/OBESITY 12/21/2010  . Essential hypertension 12/29/2009  . Asthma 12/29/2009  . GERD 12/29/2009  . HIATAL HERNIA 12/29/2009  . HEART MURMUR, SYSTOLIC [id number]    Past Surgical History:  Procedure Laterality Date  . APPENDECTOMY    . CHOLECYSTECTOMY    . COLONOSCOPY    . COLONOSCOPY N/A 09/26/2013   Procedure: COLONOSCOPY;  Surgeon: Rogene Houston, MD;  [facility];  Service: Endoscopy;  Laterality: N/A;  1030-rescheduled to 12:00pm Ann notified pt  . COLONOSCOPY N/A 03/27/2015   Procedure: COLONOSCOPY;  Surgeon: Rogene Houston, MD;  [facility];  Service: Endoscopy;  Laterality: N/A;  11:15 - moved to 8:25 - Ann to notify pt  . ESOPHAGOGASTRODUODENOSCOPY  12/08/2011   Procedure: ESOPHAGOGASTRODUODENOSCOPY (EGD);  Surgeon: Rogene Houston, MD;  [facility];  Service: Endoscopy;  Laterality: N/A;  3:00   . ESOPHAGOGASTRODUODENOSCOPY N/A 03/27/2015   Procedure: ESOPHAGOGASTRODUODENOSCOPY (EGD);  Surgeon: Rogene Houston, MD;  [facility];  Service: Endoscopy;  Laterality: N/A;  . EYE SURGERY Bilateral    cataract surgery  . GIVENS CAPSULE STUDY N/A 11/04/2015   Procedure: GIVENS CAPSULE STUDY;  Surgeon: Rogene Houston, MD;  [facility];  Service: Endoscopy;  Laterality: N/A;  . Hysterscopy    . KNEE ARTHROSCOPY     Left  . NECK SURGERY     x 2   . TONSILLECTOMY    . TOTAL KNEE ARTHROPLASTY  07/11/2012   Procedure: TOTAL KNEE ARTHROPLASTY;  Surgeon: Ninetta Lights, MD;  Location: [facility];  Service: Orthopedics;  Laterality: Left;  left total knee  arthroplasty  . UPPER GASTROINTESTINAL ENDOSCOPY      OB History    No data available       Home Medications    Prior to Admission medications   Medication Sig Start Date End Date Taking? Authorizing Provider  aspirin EC 81 MG tablet Take 81 mg by mouth every morning.   Yes Historical Provider, MD  Cholecalciferol (VITAMIN D) 2000 UNITS CAPS Take 1 capsule by mouth daily.     Yes Historical Provider, MD  diazepam (VALIUM) 5 MG tablet Take 5 mg by mouth every 8 (eight) hours as needed for anxiety.    Yes Historical Provider, MD  ferrous sulfate 325 (65 FE) MG tablet Take 325 mg by mouth 2 (two) times daily with a meal.   Yes Historical Provider, MD  gabapentin (NEURONTIN) 300 MG capsule Take 300 mg by mouth at bedtime.    Yes Historical Provider, MD  hydrochlorothiazide (HYDRODIURIL) 25 MG tablet Take 12.5 mg by mouth daily.   Yes Historical Provider, MD  metFORMIN (GLUCOPHAGE) 500 MG tablet Take 500 mg by mouth 2 (two) times daily with a meal.    Yes Historical Provider, MD  montelukast (SINGULAIR) 10 MG tablet Take 10 mg by mouth every morning.    Yes Historical Provider, MD  omeprazole (PRILOSEC) 20 MG capsule TAKE 1 CAPSULE BY MOUTH TWICE DAILY BEFORE A MEAL 07/29/16  Yes Rogene Houston, MD  TURMERIC PO Take 1 tablet by mouth daily.   Yes Historical Provider, MD  traMADol (ULTRAM) 50 MG tablet Take 1 tablet (50 mg total) by mouth every 6 (six) hours as needed. 09/12/16   Milton Ferguson, MD    Family History Family History  Problem Relation Age of Onset  . Hypertension Son   . Alzheimer's disease Mother   . Kidney disease Mother   . Kidney disease Father   . Cancer Maternal Aunt     leukemia  . Liver disease Maternal Uncle   . Kidney disease Paternal Uncle     kidney removed  . Diabetes Maternal Grandmother   . Diabetes Maternal Grandfather   . Cancer Paternal Grandmother     breast, liver  . Colon cancer Neg Hx     Social History Social History  Substance Use Topics    . Smoking status: Never Smoker  . Smokeless tobacco: Never Used  . Alcohol use No     Allergies   Nsaids; Cephalexin; Darvocet [propoxyphene n-acetaminophen]; Percocet [oxycodone-acetaminophen]; and Penicillins   Review of Systems Review of Systems  Constitutional: Negative for appetite change and fatigue.  HENT: Negative for congestion, ear discharge and sinus pressure.   Eyes: Negative for discharge.  Respiratory: Negative for cough.   Cardiovascular: Positive for chest pain.  Gastrointestinal: Negative for abdominal pain and diarrhea.  Genitourinary: Negative  for frequency and hematuria.  Musculoskeletal: Negative for back pain.  Skin: Negative for rash.  Neurological: Negative for seizures and headaches.  Psychiatric/Behavioral: Negative for hallucinations.     Physical Exam Updated Vital Signs BP 148/66   Pulse 65   Temp 98.2 F (36.8 C) (Oral)   Resp 18   Ht 5' 2.5" (1.588 m)   Wt 185 lb (83.9 kg)   SpO2 97%   BMI 33.30 kg/m   Physical Exam  Constitutional: She is oriented to person, place, and time. She appears well-developed.  HENT:  Head: Normocephalic.  Eyes: Conjunctivae and EOM are normal. No scleral icterus.  Neck: Neck supple. No thyromegaly present.  Cardiovascular: Normal rate and regular rhythm.  Exam reveals no gallop and no friction rub.   No murmur heard. Pulmonary/Chest: No stridor. She has no wheezes. She has no rales. She exhibits no tenderness.  Abdominal: She exhibits no distension. There is no tenderness. There is no rebound.  Musculoskeletal: Normal range of motion. She exhibits no edema.  Lymphadenopathy:    She has no cervical adenopathy.  Neurological: She is oriented to person, place, and time. She exhibits normal muscle tone. Coordination normal.  Skin: No rash noted. No erythema.  Psychiatric: She has a normal mood and affect. Her behavior is normal.     ED Treatments / Results  Labs (all labs ordered are listed, but only  abnormal results are displayed) Labs Reviewed  BASIC METABOLIC PANEL - Abnormal; Notable for the following:       Result Value   Potassium 3.4 (*)    Glucose, Bld 124 (*)    BUN 25 (*)    All other components within normal limits  CBC  TROPONIN I  I-STAT TROPOININ, ED    EKG  EKG Interpretation  Date/Time:  Monday September 12 2016 14:19:27 EDT Ventricular Rate:  86 PR Interval:  196 QRS Duration: 96 QT Interval:  374 QTC Calculation: 447 R Axis:   61 Text Interpretation:  Normal sinus rhythm Low voltage QRS Possible Lateral infarct , age undetermined Abnormal ECG Confirmed by Lyndel Sarate  MD, Broadus John [id number]) on 09/12/2016 5:09:09 PM       Radiology Dg Chest 2 View  Result Date: 09/12/2016 CLINICAL DATA:  70 year old female with chest pain since yesterday. Initial encounter. EXAM: CHEST  2 VIEW COMPARISON:  12/26/2014 and 11/14/2013 chest x-ray. 07/18/2013 chest CT. FINDINGS: Left lower lobe pulmonary nodules as noted on prior exams. Projecting over left hemidiaphragm is a new nodular density which may represent nipple shadow. This could be evaluated with nipple marker view. Biapical pleural thickening stable. No infiltrate, congestive heart failure or pneumothorax. Moderately large hiatal hernia more prominent than on the prior exams. Slightly tortuous aorta. Heart size within normal limits. Prior cervical spine surgery. IMPRESSION: Left lower lobe pulmonary nodules as noted on prior exams. Projecting over left hemidiaphragm is a new nodular density which may represent nipple shadow. This could be evaluated with nipple marker view to exclude underlying new nodule. No infiltrate, congestive heart failure or pneumothorax. Moderately large hiatal hernia more prominent than on the prior exams. Electronically Signed   By: Genia Del M.D.   On: 09/12/2016 15:10    Procedures Procedures (including critical care time)  Medications Ordered in ED Medications  famotidine (PEPCID) IVPB 20 mg  premix (0 mg Intravenous Stopped 09/12/16 1843)  LORazepam (ATIVAN) injection 0.5 mg (0.5 mg Intravenous Given 09/12/16 1753)     Initial Impression / Assessment and Plan / ED  Course  I have reviewed the triage vital signs and the nursing notes.  Pertinent labs & imaging results that were available during my care of the patient were reviewed by me and considered in my medical decision making (see chart for details).  Clinical Course    Patient with chest pain. History of normal stress test. 2 negative troponins. Chest x-ray shows old scarring. Doubt chest pain is coronary artery disease or PE. Patient will follow-up with PCP for further workup and is given some Ultram for discomfort. She is also continuing take her Prilosec twice a day  Final Clinical Impressions(s) / ED Diagnoses   Final diagnoses:  Other chest pain    New Prescriptions New Prescriptions   TRAMADOL (ULTRAM) 50 MG TABLET    Take 1 tablet (50 mg total) by mouth every 6 (six) hours as needed.     Milton Ferguson, MD 09/12/16 [phone]

## 2016-09-12 NOTE — ED Notes (Signed)
Pt alert & oriented x4, stable gait. Patient given discharge instructions, paperwork & prescription(s). Patient  instructed to stop at the registration desk to finish any additional paperwork. Patient verbalized understanding. Pt left department w/ no further questions. 

## 2016-09-12 NOTE — ED Triage Notes (Signed)
Pt reports central CP that radiates to both shoulders that began on Wednesday. Pt denies any cardiac history. Denies any SOB, vomiting, dizziness, etc.

## 2016-09-12 NOTE — Discharge Instructions (Signed)
Follow up with your md this week for recheck  °

## 2016-09-20 DIAGNOSIS — M25562 Pain in left knee: Secondary | ICD-10-CM | POA: Diagnosis not present

## 2016-09-21 DIAGNOSIS — Z1389 Encounter for screening for other disorder: Secondary | ICD-10-CM | POA: Diagnosis not present

## 2016-09-21 DIAGNOSIS — R0789 Other chest pain: Secondary | ICD-10-CM | POA: Diagnosis not present

## 2016-09-21 DIAGNOSIS — K219 Gastro-esophageal reflux disease without esophagitis: Secondary | ICD-10-CM | POA: Diagnosis not present

## 2016-09-21 DIAGNOSIS — Z6833 Body mass index (BMI) 33.0-33.9, adult: Secondary | ICD-10-CM | POA: Diagnosis not present

## 2016-09-21 DIAGNOSIS — Z23 Encounter for immunization: Secondary | ICD-10-CM | POA: Diagnosis not present

## 2016-10-04 ENCOUNTER — Ambulatory Visit (INDEPENDENT_AMBULATORY_CARE_PROVIDER_SITE_OTHER): Payer: Medicare Other | Admitting: Internal Medicine

## 2016-10-10 DIAGNOSIS — J343 Hypertrophy of nasal turbinates: Secondary | ICD-10-CM | POA: Diagnosis not present

## 2016-10-10 DIAGNOSIS — J209 Acute bronchitis, unspecified: Secondary | ICD-10-CM | POA: Diagnosis not present

## 2016-10-10 DIAGNOSIS — Z1389 Encounter for screening for other disorder: Secondary | ICD-10-CM | POA: Diagnosis not present

## 2016-10-10 DIAGNOSIS — J040 Acute laryngitis: Secondary | ICD-10-CM | POA: Diagnosis not present

## 2016-10-10 DIAGNOSIS — E119 Type 2 diabetes mellitus without complications: Secondary | ICD-10-CM | POA: Diagnosis not present

## 2016-10-10 DIAGNOSIS — R0602 Shortness of breath: Secondary | ICD-10-CM | POA: Diagnosis not present

## 2016-10-10 DIAGNOSIS — Z6833 Body mass index (BMI) 33.0-33.9, adult: Secondary | ICD-10-CM | POA: Diagnosis not present

## 2016-10-25 ENCOUNTER — Encounter (INDEPENDENT_AMBULATORY_CARE_PROVIDER_SITE_OTHER): Payer: Self-pay | Admitting: Internal Medicine

## 2016-10-25 ENCOUNTER — Ambulatory Visit (INDEPENDENT_AMBULATORY_CARE_PROVIDER_SITE_OTHER): Payer: Medicare Other | Admitting: Internal Medicine

## 2016-10-25 VITALS — BP 130/78 | HR 76 | Temp 98.4°F | Resp 18 | Ht 62.5 in | Wt 183.0 lb

## 2016-10-25 DIAGNOSIS — K219 Gastro-esophageal reflux disease without esophagitis: Secondary | ICD-10-CM | POA: Diagnosis not present

## 2016-10-25 DIAGNOSIS — Z862 Personal history of diseases of the blood and blood-forming organs and certain disorders involving the immune mechanism: Secondary | ICD-10-CM | POA: Diagnosis not present

## 2016-10-25 DIAGNOSIS — E876 Hypokalemia: Secondary | ICD-10-CM | POA: Diagnosis not present

## 2016-10-25 MED ORDER — POTASSIUM CHLORIDE ER 10 MEQ PO TBCR: 10.0000 meq | EXTENDED_RELEASE_TABLET | Freq: Every day | ORAL | 5 refills | Status: DC

## 2016-10-25 MED ORDER — FERROUS SULFATE 325 (65 FE) MG PO TABS: 325.0000 mg | ORAL_TABLET | Freq: Every day | ORAL | Status: AC

## 2016-10-25 NOTE — Progress Notes (Signed)
Presenting complaint;  Follow-up for GERD. History of iron deficiency anemia. Patient states she was seen in emergency room a few weeks ago for chest pain.  Subjective:  [name] 70 year old Caucasian female who has history of chronic GERD and iron deficiency anemia who is here for scheduled visit. She was last seen in April 2017. She states she was seen in emergency room over 5 weeks ago for right-sided chest pain which lingered on for 3 days. She does not recall that she had frequent heartburn during that time. Troponin levels were negative and she was discharged. She has not had any more pain. She states heartburn occurs once in a while with certain foods. She rarely has regurgitation. She also denies nausea vomiting or dysphagia. Her appetite is good but she has changed her eating habits since she was diagnosed with diabetes over a year ago. She has managed to lose 23 pounds in one year. She states she has constant pain even though she had knee replacement over 4 years ago. She walks at least 5 days each week.  Her bowels move daily. She denies melena or rectal bleeding. She has had and of bed elevated by 4 x 4 wooden block. She states she was having throat irritation and saw Dr.Teoh who felt her symptom was because she was a mouth breather. She says she drinks few sips of water when she wakes up in middle of night.   Current Medications: Outpatient Encounter Prescriptions as of 10/25/2016  Medication Sig  . aspirin EC 81 MG tablet Take 81 mg by mouth every morning.  . Cholecalciferol (VITAMIN D) 2000 UNITS CAPS Take 1 capsule by mouth daily.    . diazepam (VALIUM) 5 MG tablet Take 5 mg by mouth every 8 (eight) hours as needed for anxiety.   . ferrous sulfate 325 (65 FE) MG tablet Take 325 mg by mouth 2 (two) times daily with a meal.  . gabapentin (NEURONTIN) 300 MG capsule Take 300 mg by mouth at bedtime.   . hydrochlorothiazide (HYDRODIURIL) 25 MG tablet Take 12.5 mg by mouth daily.  .  metFORMIN (GLUCOPHAGE) 500 MG tablet Take 500 mg by mouth 2 (two) times daily with a meal.   . montelukast (SINGULAIR) 10 MG tablet Take 10 mg by mouth every morning.   Marland Kitchen omeprazole (PRILOSEC) 20 MG capsule TAKE 1 CAPSULE BY MOUTH TWICE DAILY BEFORE A MEAL  . TURMERIC PO Take 1 tablet by mouth daily.  . [DISCONTINUED] traMADol (ULTRAM) 50 MG tablet Take 1 tablet (50 mg total) by mouth every 6 (six) hours as needed. (Patient not taking: Reported on 10/25/2016)   No facility-administered encounter medications on file as of 10/25/2016.      Objective: Blood pressure 130/78, pulse 76, temperature 98.4 F (36.9 C), temperature source Oral, resp. rate 18, height 5' 2.5" (1.588 m), weight 183 lb (83 kg). Patient is alert and in no acute distress. Conjunctiva is pink. Sclera is nonicteric Oropharyngeal mucosa is normal. No neck masses or thyromegaly noted. Cardiac exam with regular rhythm normal S1 and S2. She has faint systolic ejection murmur heard at left sternal border. Lungs are clear to auscultation. Abdomen is soft and nontender without organomegaly or masses. No LE edema or clubbing noted.  Labs/studies Results: Lab data from 09/12/2016  Serum potassium 3.4.  BUN 25 and creatinine 0.86.   WBC 8.3, H&H 14 and 42.2 and platelet count 228K.  Assessment:  #1. Chronic GERD. She has moderate-sized sliding hiatal hernia. Symptoms are well controlled with  therapy. She has lost over 20 pounds in last 1 year since she was diagnosed with diabetes. She may be able to tolerate reduced dose of PPI. #2. History of iron deficiency anemia. He has undergone extensive evaluation the past. Etiology felt to be multifactorial i.e. blood loss from upper GI tract and impaired iron absorption. H&H is now normal. She could drop iron dose. #3. Hypokalemia. Mild hypokalemia noted on recent visit to emergency room. Serum potassium was low on prior tests as well. Patient is on diuretic therapy and therefore at  risk to develop further drop in serum potassium.   Plan:  Hemoccult 1. Decrease ferrous sulfate to one dose daily. She will continue a.m. dose of omeprazole but you second dose on as-needed basis. KCl 10 mEq by mouth daily. Patient will have electrolytes checked by Dr. Hilma Favors on her next visit with him in December 2017. Office visit in one year.

## 2016-10-25 NOTE — Patient Instructions (Addendum)
Hemoccult 1 Try decreasing omeprazole dose to once a day; can use second dose on as-needed basis.

## 2016-11-10 ENCOUNTER — Other Ambulatory Visit (INDEPENDENT_AMBULATORY_CARE_PROVIDER_SITE_OTHER): Payer: Self-pay | Admitting: *Deleted

## 2016-11-10 ENCOUNTER — Telehealth (INDEPENDENT_AMBULATORY_CARE_PROVIDER_SITE_OTHER): Payer: Self-pay | Admitting: *Deleted

## 2016-11-10 NOTE — Telephone Encounter (Signed)
   Diagnosis:    Result(s)   Card 1: Negative:            Completed by: Courtney Bellizzi,LPN   HEMOCCULT SENSA DEVELOPER: [id number]   EXPIRATION DATE:  2020/5  HEMOCCULT SENSA CARD:  [id number]   EXPIRATION DATE: 03/20   CARD CONTROL RESULTS:  POSITIVE:Positive  NEGATIVE: Negative    ADDITIONAL COMMENTS: Patient was called and made aware.

## 2016-11-11 DIAGNOSIS — D509 Iron deficiency anemia, unspecified: Secondary | ICD-10-CM | POA: Diagnosis not present

## 2016-11-11 NOTE — Telephone Encounter (Signed)
Stool is guaiac negative. Patient aware of result.

## 2016-11-28 ENCOUNTER — Emergency Department (HOSPITAL_COMMUNITY)
Admission: EM | Admit: 2016-11-28 | Discharge: 2016-11-28 | Disposition: A | Discharge status: Home / Self Care | Payer: Medicare Other | Attending: Emergency Medicine | Admitting: Emergency Medicine

## 2016-11-28 ENCOUNTER — Encounter (HOSPITAL_COMMUNITY): Payer: Self-pay | Admitting: Emergency Medicine

## 2016-11-28 ENCOUNTER — Emergency Department (HOSPITAL_COMMUNITY): Payer: Medicare Other

## 2016-11-28 DIAGNOSIS — I10 Essential (primary) hypertension: Secondary | ICD-10-CM | POA: Diagnosis not present

## 2016-11-28 DIAGNOSIS — Y999 Unspecified external cause status: Secondary | ICD-10-CM | POA: Insufficient documentation

## 2016-11-28 DIAGNOSIS — Z7982 Long term (current) use of aspirin: Secondary | ICD-10-CM | POA: Insufficient documentation

## 2016-11-28 DIAGNOSIS — Z7984 Long term (current) use of oral hypoglycemic drugs: Secondary | ICD-10-CM | POA: Insufficient documentation

## 2016-11-28 DIAGNOSIS — S2242XA Multiple fractures of ribs, left side, initial encounter for closed fracture: Secondary | ICD-10-CM | POA: Diagnosis not present

## 2016-11-28 DIAGNOSIS — J45909 Unspecified asthma, uncomplicated: Secondary | ICD-10-CM | POA: Insufficient documentation

## 2016-11-28 DIAGNOSIS — Y9222 Religious institution as the place of occurrence of the external cause: Secondary | ICD-10-CM | POA: Diagnosis not present

## 2016-11-28 DIAGNOSIS — W01198A Fall on same level from slipping, tripping and stumbling with subsequent striking against other object, initial encounter: Secondary | ICD-10-CM | POA: Diagnosis not present

## 2016-11-28 DIAGNOSIS — E119 Type 2 diabetes mellitus without complications: Secondary | ICD-10-CM | POA: Diagnosis not present

## 2016-11-28 DIAGNOSIS — Y9389 Activity, other specified: Secondary | ICD-10-CM | POA: Diagnosis not present

## 2016-11-28 DIAGNOSIS — Z79899 Other long term (current) drug therapy: Secondary | ICD-10-CM | POA: Insufficient documentation

## 2016-11-28 DIAGNOSIS — S2241XA Multiple fractures of ribs, right side, initial encounter for closed fracture: Secondary | ICD-10-CM | POA: Insufficient documentation

## 2016-11-28 DIAGNOSIS — S299XXA Unspecified injury of thorax, initial encounter: Secondary | ICD-10-CM | POA: Diagnosis present

## 2016-11-28 MED ORDER — METHOCARBAMOL 500 MG PO TABS
500.0000 mg | ORAL_TABLET | Freq: Once | ORAL | Status: AC
  Administered 2016-11-28: 500 mg via ORAL
  Filled 2016-11-28: qty 1

## 2016-11-28 MED ORDER — ONDANSETRON HCL 4 MG PO TABS
4.0000 mg | ORAL_TABLET | Freq: Once | ORAL | Status: AC
  Administered 2016-11-28: 4 mg via ORAL
  Filled 2016-11-28: qty 1

## 2016-11-28 MED ORDER — MORPHINE SULFATE (PF) 4 MG/ML IV SOLN
4.0000 mg | Freq: Once | INTRAVENOUS | Status: AC
  Administered 2016-11-28: 4 mg via INTRAMUSCULAR
  Filled 2016-11-28: qty 1

## 2016-11-28 MED ORDER — HYDROCODONE-ACETAMINOPHEN 5-325 MG PO TABS: 1.0000 | ORAL_TABLET | ORAL | 0 refills | Status: DC | PRN

## 2016-11-28 MED ORDER — METHOCARBAMOL 500 MG PO TABS: 500.0000 mg | ORAL_TABLET | Freq: Three times a day (TID) | ORAL | 0 refills | Status: DC

## 2016-11-28 NOTE — Discharge Instructions (Signed)
Your x-rays show abnormalities of the seventh, eighth, and ninth ribs on the right. Your lungs are clear. Please use your incentive spirometer with each commercial during the day. Please use your Robaxin and Norco. These medications may cause drowsiness, and/or lightheadedness. Please use it with caution. Please use a pillow to splint your rib area while doing your incentive spirometer. Please see your primary physician later this week for recheck of your fractures in your general condition. Return to the emergency department if any emergent changes, problems, or concerns.

## 2016-11-28 NOTE — ED Triage Notes (Signed)
PT states she tripped and fell today today at church and hit her right side on a pew and feeling pain to right ribs at this time.

## 2016-11-28 NOTE — ED Provider Notes (Signed)
[facility] DEPT Provider Note   CSN: [id number] Arrival date & time: 11/28/16  1526  By signing my name below, I, [name], attest that this documentation has been prepared under the direction and in the presence of Lily Kocher, PA-C. Electronically Signed: Rayna [name], ED Scribe. 11/28/16. 5:05 PM.   History   Chief Complaint Chief Complaint  Patient presents with  . Fall    HPI HPI Comments: [name] is a 70 y.o. female who presents to the Emergency Department complaining of fall that occurred earlier today. She states she was taking a step down from a podium at church, experienced knee weakness and fell into a row of chairs landing on her right ribs. Pt reports associated, moderate, sudden onset, right rib pain. Her pain worsens with any movement or deep breathing and radiates to her right back. She states she was able to lift herself and take a seat s/p the fall but since has not moved w/o aid. She takes baby aspirin daily but denies being on any other anticoagulation. She denies a h/o of injury to her chest or ribs. She denies head trauma, LOC, neck pain or other associated symptoms at this time.   The history is provided by the patient and the spouse. No language interpreter was used.  Fall  This is a new problem. The current episode started 3 to 5 hours ago. The problem occurs rarely. The problem has not changed since onset.Pertinent negatives include no chest pain and no abdominal pain. The symptoms are aggravated by walking, bending and twisting. Nothing relieves the symptoms. She has tried nothing for the symptoms. The treatment provided no relief.   Past Medical History:  Diagnosis Date  . Anemia    takes iron  . Anginal pain (HCC)    cardiac clearance on the chart-pt also has GI promblems  . Arthritis   . Asthma   . Carpal tunnel syndrome    bilateral- wears splints  . Chest pain 01/05/2015   MI ruled out.  . Chronic anxiety   . GERD  (gastroesophageal reflux disease)   . H/O hiatal hernia    'Sliding"  . Irregular heart beat   . Murmur     Since Birth followed by Dr Verl Blalock.  . 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 in left ear.  Left  side of body weaker than right.  Carpal tunnel bil .  Marland Kitchen Ulcer [facility])    gastric    Patient Active Problem List   Diagnosis Date Noted  . Diabetes ([place]) 03/28/2016  . Pain in the chest   . GERD (gastroesophageal reflux disease)   . Chest pain 01/05/2015  . Neuropathy ([place]) 01/05/2015  . Bronchitis   . 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 Chest pain at rest 11/14/2013  . Left knee pain 09/18/2013  . Salmonella 05/24/2013  . Thrombocytopenia, unspecified 05/24/2013  . Acute gastroenteritis 05/21/2013  . Fever 05/21/2013  . Abnormal urinalysis 05/21/2013  . Hypokalemia 05/21/2013  . Dehydration 05/21/2013  . Coccydynia 05/07/2013  . Anemia 08/06/2012  . PUD (peptic ulcer disease) 02/06/2012  . OVERWEIGHT/OBESITY 12/21/2010  . Essential hypertension 12/29/2009  . Asthma 12/29/2009  . GERD 12/29/2009  . HIATAL HERNIA 12/29/2009  . HEART MURMUR, SYSTOLIC [id number]    Past Surgical History:  Procedure Laterality Date  . APPENDECTOMY    . CHOLECYSTECTOMY    . COLONOSCOPY    . COLONOSCOPY N/A 09/26/2013  Procedure: COLONOSCOPY;  Surgeon: Rogene Houston, MD;  [facility];  Service: Endoscopy;  Laterality: N/A;  1030-rescheduled to 12:00pm Ann notified pt  . COLONOSCOPY N/A 03/27/2015   Procedure: COLONOSCOPY;  Surgeon: Rogene Houston, MD;  [facility];  Service: Endoscopy;  Laterality: N/A;  11:15 - moved to 8:25 - Ann to notify pt  . ESOPHAGOGASTRODUODENOSCOPY  12/08/2011   Procedure: ESOPHAGOGASTRODUODENOSCOPY (EGD);  Surgeon: Rogene Houston, MD;  [facility];  Service: Endoscopy;  Laterality: N/A;  3:00   . ESOPHAGOGASTRODUODENOSCOPY N/A 03/27/2015   Procedure:  ESOPHAGOGASTRODUODENOSCOPY (EGD);  Surgeon: Rogene Houston, MD;  [facility];  Service: Endoscopy;  Laterality: N/A;  . EYE SURGERY Bilateral    cataract surgery  . GIVENS CAPSULE STUDY N/A 11/04/2015   Procedure: GIVENS CAPSULE STUDY;  Surgeon: Rogene Houston, MD;  [facility];  Service: Endoscopy;  Laterality: N/A;  . Hysterscopy    . KNEE ARTHROSCOPY     Left  . NECK SURGERY     x 2   . TONSILLECTOMY    . TOTAL KNEE ARTHROPLASTY  07/11/2012   Procedure: TOTAL KNEE ARTHROPLASTY;  Surgeon: Ninetta Lights, MD;  Location: [facility];  Service: Orthopedics;  Laterality: Left;  left total knee arthroplasty  . UPPER GASTROINTESTINAL ENDOSCOPY      OB History    Gravida Para Term Preterm AB Living             1   SAB TAB Ectopic Multiple Live Births                   Home Medications    Prior to Admission medications   Medication Sig Start Date End Date Taking? Authorizing Provider  aspirin EC 81 MG tablet Take 81 mg by mouth every morning.    Historical Provider, MD  Cholecalciferol (VITAMIN D) 2000 UNITS CAPS Take 1 capsule by mouth daily.      Historical Provider, MD  diazepam (VALIUM) 5 MG tablet Take 5 mg by mouth every 8 (eight) hours as needed for anxiety.     Historical Provider, MD  ferrous sulfate 325 (65 FE) MG tablet Take 1 tablet (325 mg total) by mouth daily with breakfast. 10/25/16   Rogene Houston, MD  gabapentin (NEURONTIN) 300 MG capsule Take 300 mg by mouth at bedtime.     Historical Provider, MD  hydrochlorothiazide (HYDRODIURIL) 25 MG tablet Take 12.5 mg by mouth daily.    Historical Provider, MD  metFORMIN (GLUCOPHAGE) 500 MG tablet Take 500 mg by mouth 2 (two) times daily with a meal.     Historical Provider, MD  montelukast (SINGULAIR) 10 MG tablet Take 10 mg by mouth every morning.     Historical Provider, MD  omeprazole (PRILOSEC) 20 MG capsule TAKE 1 CAPSULE BY MOUTH TWICE DAILY BEFORE A MEAL 07/29/16   Rogene Houston, MD  potassium  chloride (K-DUR) 10 MEQ tablet Take 1 tablet (10 mEq total) by mouth daily. 10/25/16   Rogene Houston, MD  TURMERIC PO Take 1 tablet by mouth daily.    Historical Provider, MD    Family History Family History  Problem Relation Age of Onset  . Hypertension Son   . Alzheimer's disease Mother   . Kidney disease Mother   . Kidney disease Father   . Cancer Maternal Aunt     leukemia  . Liver disease Maternal Uncle   . Kidney disease Paternal  Uncle     kidney removed  . Diabetes Maternal Grandmother   . Diabetes Maternal Grandfather   . Cancer Paternal Grandmother     breast, liver  . Colon cancer Neg Hx     Social History Social History  Substance Use Topics  . Smoking status: Never Smoker  . Smokeless tobacco: Never Used  . Alcohol use No     Allergies   Nsaids; Cephalexin; Darvocet [propoxyphene n-acetaminophen]; Percocet [oxycodone-acetaminophen]; and Penicillins   Review of Systems Review of Systems  Cardiovascular: Negative for chest pain.  Gastrointestinal: Negative for abdominal pain.  Musculoskeletal: Positive for arthralgias, back pain and myalgias. Negative for neck pain.  Skin: Negative for wound.  Neurological: Negative for syncope.  All other systems reviewed and are negative.  Physical Exam Updated Vital Signs BP 148/65 (BP Location: Left Arm)   Pulse 74   Temp 97.5 F (36.4 C) (Oral)   Resp 20   Ht 5\' 2"  (1.575 m)   Wt 180 lb (81.6 kg)   SpO2 100%   BMI 32.92 kg/m   Physical Exam  Constitutional: She is oriented to person, place, and time. She appears well-developed and well-nourished.  HENT:  Head: Normocephalic and atraumatic.  Eyes: EOM are normal.  Neck: Normal range of motion.  Cervical spine is non tender  Cardiovascular: Normal rate, regular rhythm and normal heart sounds.  Exam reveals no gallop and no friction rub.   No murmur heard. Pulmonary/Chest: Effort normal and breath sounds normal. No respiratory distress. She has no  wheezes. She has no rales. She exhibits no tenderness.  No deformity of the clavicle or sternum. Symmetrical rise and fall of the chest. TTP to the right lower ribs. No bruising noted. No bruising of the breasts. No bleeding from the nipple region.   Abdominal: Soft. Bowel sounds are normal. There is no tenderness.  Musculoskeletal: Normal range of motion. She exhibits tenderness.  FROM of the RUE. No pain with palpation of the knees or lower legs.   Neurological: She is alert and oriented to person, place, and time.  Skin: Skin is warm and dry.  Psychiatric: She has a normal mood and affect.  Nursing note and vitals reviewed.  ED Treatments / Results  Labs (all labs ordered are listed, but only abnormal results are displayed) Labs Reviewed - No data to display  EKG  EKG Interpretation None       Radiology Dg Ribs Unilateral W/chest Right  Result Date: 11/28/2016 CLINICAL DATA:  Right rib pain, status post fall EXAM: RIGHT RIBS AND CHEST - 3+ VIEW COMPARISON:  Chest radiographs dated 09/12/2016 FINDINGS: Lungs are clear.  No pleural effusion or pneumothorax. The heart is normal in size. Nondisplaced right anterolateral 7th-9th rib fracture deformities. Moderate hiatal hernia. Cholecystectomy clips. IMPRESSION: Nondisplaced right anterolateral 7th-9th rib fracture deformities. Electronically Signed   By: Julian Hy M.D.   On: 11/28/2016 16:31    Procedures Procedures  DIAGNOSTIC STUDIES: Oxygen Saturation is 100% on RA, normal by my interpretation.    COORDINATION OF CARE: 5:03 PM Discussed next steps with pt. Pt verbalized understanding and is agreeable with the plan.    Medications Ordered in ED Medications - No data to display   Initial Impression / Assessment and Plan / ED Course  I have reviewed the triage vital signs and the nursing notes.  Pertinent labs & imaging results that were available during my care of the patient were reviewed by me and considered in my  medical  decision making (see chart for details).  Clinical Course    FRACTURE CARE RIGHT RIBS.  Patient is a 70 year old female who sustained a fracture of ribs 7 through 9 following a fall today.  I described the fractures to the patient in terms which he understands. I discussed the fracture care procedure with the patient in terms which he understands and she is in agreement with this procedure. The patient is identified by arm band. Procedural time out observed. Incentive spirometry ordered for the patient. She will use this several times each hour. The patient will be treated for pain with Robaxin and Norco. The patient tolerated the procedure with minimal problem. Questions were answered FOR  the patient and by the husband.   *I have reviewed nursing notes, vital signs, and all appropriate lab and imaging results for this patient.**   **I personally performed the services described in this documentation, which was scribed in my presence. The recorded information has been reviewed and is accurate.*  Final Clinical Impressions(s) / ED Diagnoses  Vital signs within normal limits. Pulse oximetry is 100% on room air. X-ray of the chest and right ribs reveals nondisplaced right anterior lateral seventh through ninth rib fracture deformities.  I discussed these findings with the patient in terms which he understands. We discussed the need for incentive spirometry, and pain medication. The patient will follow-up with Dr. Hilma Favors in the office this week. Prescription for Robaxin and Norco given to the patient.    Final diagnoses:  None    New Prescriptions New Prescriptions   No medications on file     Lily Kocher, PA-C 11/28/16 [place], PA-C 11/28/16 1746    Fredia Sorrow, MD 11/28/16 [phone]

## 2016-12-01 DIAGNOSIS — Z6832 Body mass index (BMI) 32.0-32.9, adult: Secondary | ICD-10-CM | POA: Diagnosis not present

## 2016-12-01 DIAGNOSIS — S2231XD Fracture of one rib, right side, subsequent encounter for fracture with routine healing: Secondary | ICD-10-CM | POA: Diagnosis not present

## 2016-12-21 DIAGNOSIS — E6609 Other obesity due to excess calories: Secondary | ICD-10-CM | POA: Diagnosis not present

## 2016-12-21 DIAGNOSIS — Z1389 Encounter for screening for other disorder: Secondary | ICD-10-CM | POA: Diagnosis not present

## 2016-12-21 DIAGNOSIS — Z6832 Body mass index (BMI) 32.0-32.9, adult: Secondary | ICD-10-CM | POA: Diagnosis not present

## 2016-12-21 DIAGNOSIS — E119 Type 2 diabetes mellitus without complications: Secondary | ICD-10-CM | POA: Diagnosis not present

## 2016-12-21 DIAGNOSIS — I10 Essential (primary) hypertension: Secondary | ICD-10-CM | POA: Diagnosis not present

## 2017-01-21 ENCOUNTER — Other Ambulatory Visit (INDEPENDENT_AMBULATORY_CARE_PROVIDER_SITE_OTHER): Payer: Self-pay | Admitting: Internal Medicine

## 2017-02-03 ENCOUNTER — Encounter (INDEPENDENT_AMBULATORY_CARE_PROVIDER_SITE_OTHER): Payer: Self-pay

## 2017-02-07 DIAGNOSIS — H26491 Other secondary cataract, right eye: Secondary | ICD-10-CM | POA: Diagnosis not present

## 2017-02-13 DIAGNOSIS — Z6833 Body mass index (BMI) 33.0-33.9, adult: Secondary | ICD-10-CM | POA: Diagnosis not present

## 2017-02-13 DIAGNOSIS — J069 Acute upper respiratory infection, unspecified: Secondary | ICD-10-CM | POA: Diagnosis not present

## 2017-02-13 DIAGNOSIS — J029 Acute pharyngitis, unspecified: Secondary | ICD-10-CM | POA: Diagnosis not present

## 2017-02-13 DIAGNOSIS — E119 Type 2 diabetes mellitus without complications: Secondary | ICD-10-CM | POA: Diagnosis not present

## 2017-02-21 ENCOUNTER — Ambulatory Visit (HOSPITAL_COMMUNITY)
Admission: RE | Admit: 2017-02-21 | Discharge: 2017-02-21 | Disposition: A | Discharge status: Home / Self Care | Payer: Medicare Other | Source: Ambulatory Visit | Attending: Ophthalmology | Admitting: Ophthalmology

## 2017-02-21 ENCOUNTER — Encounter (HOSPITAL_COMMUNITY)
Admission: RE | Disposition: A | Discharge status: Home / Self Care | Payer: Self-pay | Source: Ambulatory Visit | Attending: Ophthalmology

## 2017-02-21 DIAGNOSIS — Z7984 Long term (current) use of oral hypoglycemic drugs: Secondary | ICD-10-CM | POA: Diagnosis not present

## 2017-02-21 DIAGNOSIS — Z7982 Long term (current) use of aspirin: Secondary | ICD-10-CM | POA: Insufficient documentation

## 2017-02-21 DIAGNOSIS — Z79899 Other long term (current) drug therapy: Secondary | ICD-10-CM | POA: Diagnosis not present

## 2017-02-21 DIAGNOSIS — H26491 Other secondary cataract, right eye: Secondary | ICD-10-CM | POA: Insufficient documentation

## 2017-02-21 DIAGNOSIS — H2640 Unspecified secondary cataract: Secondary | ICD-10-CM | POA: Diagnosis present

## 2017-02-21 DIAGNOSIS — E1136 Type 2 diabetes mellitus with diabetic cataract: Secondary | ICD-10-CM | POA: Diagnosis not present

## 2017-02-21 HISTORY — PX: YAG LASER APPLICATION: SHX6189

## 2017-02-21 SURGERY — TREATMENT, USING YAG LASER
Anesthesia: LOCAL | Laterality: Right

## 2017-02-21 MED ORDER — CYCLOPENTOLATE-PHENYLEPHRINE 0.2-1 % OP SOLN
1.0000 [drp] | OPHTHALMIC | Status: AC
  Administered 2017-02-21 (×3): 1 [drp] via OPHTHALMIC

## 2017-02-21 NOTE — Discharge Instructions (Signed)
SHANISA BONICA  02/21/2017     Instructions    Activity: No Restrictions.   Diet: Resume Diet you were on at home.   Pain Medication: Tylenol if Needed.   CONTACT YOUR DOCTOR IF YOU HAVE PAIN, REDNESS IN YOUR EYE, OR DECREASED VISION.   Follow-up:today 1-2 pm with Rutherford Guys, MD.   Dr. Gershon Crane: [phone]   If you find that you cannot contact your physician, but feel that your signs and   Symptoms warrant a physician's attention, call the Emergency Room at   [phone] ext.[phone].

## 2017-02-21 NOTE — Op Note (Signed)
Hailey Kempner T. Gershon Crane, MD  Procedure: Yag Capsulotomy  Yag Laser Self Test Completedyes. Procedure: Posterior Capsulotomy, Eye Protection Worn by Staff yes. Laser In Use Sign on Door yes.  Laser: Nd:YAG Spot Size: Fixed Burst Mode: III Power Setting: 3.4 mJ/burst Number of shots: 26 Total energy delivered: 82.76 mJ   The patient tolerated the procedure without difficulty. No complications were encountered.   The patient was discharged home with the instructions to continue all her current glaucoma medications, if any.   Patient instructed to go to office at 0100 for intraocular pressure check.  Patient verbalizes understanding of discharge instructions Yes.  .   Pre-Operative Diagnosis: After-Cataract, obscuring vision, 366.53 OD Post-Operative Diagnosis: After-Cataract, obscuring vision, 366.53 OD Date of Cataract Surgery: 08/19/2009

## 2017-02-21 NOTE — H&P (Signed)
The patient was re examined and there is no change in the patients condition since the original H and P. 

## 2017-02-22 ENCOUNTER — Encounter (HOSPITAL_COMMUNITY): Payer: Self-pay | Admitting: Ophthalmology

## 2017-02-23 ENCOUNTER — Other Ambulatory Visit (HOSPITAL_COMMUNITY): Payer: Self-pay | Admitting: Family Medicine

## 2017-02-23 ENCOUNTER — Ambulatory Visit (HOSPITAL_COMMUNITY)
Admission: RE | Admit: 2017-02-23 | Discharge: 2017-02-23 | Disposition: A | Discharge status: Home / Self Care | Payer: Medicare Other | Source: Ambulatory Visit | Attending: Family Medicine | Admitting: Family Medicine

## 2017-02-23 DIAGNOSIS — Z6832 Body mass index (BMI) 32.0-32.9, adult: Secondary | ICD-10-CM | POA: Diagnosis not present

## 2017-02-23 DIAGNOSIS — S2231XS Fracture of one rib, right side, sequela: Secondary | ICD-10-CM

## 2017-02-23 DIAGNOSIS — E119 Type 2 diabetes mellitus without complications: Secondary | ICD-10-CM | POA: Diagnosis not present

## 2017-02-23 DIAGNOSIS — K449 Diaphragmatic hernia without obstruction or gangrene: Secondary | ICD-10-CM | POA: Insufficient documentation

## 2017-02-23 DIAGNOSIS — I700 Atherosclerosis of aorta: Secondary | ICD-10-CM | POA: Diagnosis not present

## 2017-02-23 DIAGNOSIS — E6609 Other obesity due to excess calories: Secondary | ICD-10-CM | POA: Diagnosis not present

## 2017-02-23 DIAGNOSIS — S2241XK Multiple fractures of ribs, right side, subsequent encounter for fracture with nonunion: Secondary | ICD-10-CM | POA: Diagnosis not present

## 2017-02-23 DIAGNOSIS — X58XXXD Exposure to other specified factors, subsequent encounter: Secondary | ICD-10-CM | POA: Diagnosis not present

## 2017-02-23 DIAGNOSIS — S2241XA Multiple fractures of ribs, right side, initial encounter for closed fracture: Secondary | ICD-10-CM | POA: Diagnosis not present

## 2017-02-23 DIAGNOSIS — N289 Disorder of kidney and ureter, unspecified: Secondary | ICD-10-CM | POA: Diagnosis not present

## 2017-02-23 DIAGNOSIS — Z1231 Encounter for screening mammogram for malignant neoplasm of breast: Secondary | ICD-10-CM

## 2017-03-08 ENCOUNTER — Ambulatory Visit (HOSPITAL_COMMUNITY)
Admission: RE | Admit: 2017-03-08 | Discharge: 2017-03-08 | Disposition: A | Discharge status: Home / Self Care | Payer: Medicare Other | Source: Ambulatory Visit | Attending: Family Medicine | Admitting: Family Medicine

## 2017-03-08 DIAGNOSIS — Z1231 Encounter for screening mammogram for malignant neoplasm of breast: Secondary | ICD-10-CM | POA: Insufficient documentation

## 2017-03-15 ENCOUNTER — Other Ambulatory Visit (HOSPITAL_COMMUNITY): Payer: Self-pay | Admitting: Family Medicine

## 2017-03-15 DIAGNOSIS — R928 Other abnormal and inconclusive findings on diagnostic imaging of breast: Secondary | ICD-10-CM

## 2017-03-28 ENCOUNTER — Ambulatory Visit (HOSPITAL_COMMUNITY)
Admission: RE | Admit: 2017-03-28 | Discharge: 2017-03-28 | Disposition: A | Discharge status: Home / Self Care | Payer: Medicare Other | Source: Ambulatory Visit | Attending: Family Medicine | Admitting: Family Medicine

## 2017-03-28 DIAGNOSIS — R928 Other abnormal and inconclusive findings on diagnostic imaging of breast: Secondary | ICD-10-CM | POA: Insufficient documentation

## 2017-03-28 DIAGNOSIS — N6002 Solitary cyst of left breast: Secondary | ICD-10-CM | POA: Diagnosis not present

## 2017-04-05 DIAGNOSIS — R112 Nausea with vomiting, unspecified: Secondary | ICD-10-CM | POA: Diagnosis not present

## 2017-04-05 DIAGNOSIS — Z6832 Body mass index (BMI) 32.0-32.9, adult: Secondary | ICD-10-CM | POA: Diagnosis not present

## 2017-04-10 DIAGNOSIS — R8290 Unspecified abnormal findings in urine: Secondary | ICD-10-CM | POA: Diagnosis not present

## 2017-04-10 DIAGNOSIS — M545 Low back pain: Secondary | ICD-10-CM | POA: Diagnosis not present

## 2017-04-10 DIAGNOSIS — K529 Noninfective gastroenteritis and colitis, unspecified: Secondary | ICD-10-CM | POA: Diagnosis not present

## 2017-04-10 DIAGNOSIS — Z6832 Body mass index (BMI) 32.0-32.9, adult: Secondary | ICD-10-CM | POA: Diagnosis not present

## 2017-04-10 DIAGNOSIS — E6609 Other obesity due to excess calories: Secondary | ICD-10-CM | POA: Diagnosis not present

## 2017-04-10 DIAGNOSIS — K5900 Constipation, unspecified: Secondary | ICD-10-CM | POA: Diagnosis not present

## 2017-04-12 ENCOUNTER — Ambulatory Visit (INDEPENDENT_AMBULATORY_CARE_PROVIDER_SITE_OTHER): Payer: Medicare Other | Admitting: Internal Medicine

## 2017-04-12 ENCOUNTER — Encounter (INDEPENDENT_AMBULATORY_CARE_PROVIDER_SITE_OTHER): Payer: Self-pay | Admitting: Internal Medicine

## 2017-04-12 ENCOUNTER — Encounter (INDEPENDENT_AMBULATORY_CARE_PROVIDER_SITE_OTHER): Payer: Self-pay | Admitting: *Deleted

## 2017-04-12 VITALS — BP 132/70 | HR 60 | Temp 97.6°F | Ht 62.5 in | Wt 179.9 lb

## 2017-04-12 DIAGNOSIS — R1030 Lower abdominal pain, unspecified: Secondary | ICD-10-CM

## 2017-04-12 NOTE — Patient Instructions (Signed)
[place] completer

## 2017-04-12 NOTE — Progress Notes (Signed)
Subjective:    Patient ID: [name], female    DOB: 1946/04/28, 71 y.o.   MRN: [id number]  HPI Presents today with c/o lower abdominal discomfort.  It feels like she has pressure on her rectum when she is sitting. Symptoms for greater than 7 months.   Hx of fall and broke 3 ribs in December. Her appetite is good. The pain is constant.  No fever, nausea or vomiting. She has a BM daily. No change in her stools.  Her last pap smear was unknown.  Her last colonoscopy/EGD was in 2016 EGD findings; No evidence of erosive esophagitis or peptic ulcer disease. Moderate to large sliding hiatal hernia with single erosion involving herniated part of the stomach. Multiple gastric polyps and antrum ranging in size from 4-10 mm. 6 of these were biopsied for routine histology.  Colonoscopy findings; Examination performed to cecum. 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 Small external hemorrhoids    Review of Systems   Past Medical History:  Diagnosis Date  . Anemia    takes iron  . Anginal pain (HCC)    cardiac clearance on the chart-pt also has GI promblems  . Arthritis   . Asthma   . Carpal tunnel syndrome    bilateral- wears splints  . Chest pain 01/05/2015   MI ruled out.  . Chronic anxiety   . GERD (gastroesophageal reflux disease)   . H/O hiatal hernia    'Sliding"  . Irregular heart beat   . Murmur     Since Birth followed by Dr Verl Blalock.  . 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 in left ear.  Left  side of body weaker than right.  Carpal tunnel bil .  Marland Kitchen Ulcer    gastric    Past Surgical History:  Procedure Laterality Date  . APPENDECTOMY    . CHOLECYSTECTOMY    . COLONOSCOPY    . COLONOSCOPY N/A 09/26/2013   Procedure: COLONOSCOPY;  Surgeon: Rogene Houston, MD;  [facility];  Service: Endoscopy;  Laterality: N/A;   1030-rescheduled to 12:00pm Ann notified pt  . COLONOSCOPY N/A 03/27/2015   Procedure: COLONOSCOPY;  Surgeon: Rogene Houston, MD;  [facility];  Service: Endoscopy;  Laterality: N/A;  11:15 - moved to 8:25 - Ann to notify pt  . ESOPHAGOGASTRODUODENOSCOPY  12/08/2011   Procedure: ESOPHAGOGASTRODUODENOSCOPY (EGD);  Surgeon: Rogene Houston, MD;  [facility];  Service: Endoscopy;  Laterality: N/A;  3:00   . ESOPHAGOGASTRODUODENOSCOPY N/A 03/27/2015   Procedure: ESOPHAGOGASTRODUODENOSCOPY (EGD);  Surgeon: Rogene Houston, MD;  [facility];  Service: Endoscopy;  Laterality: N/A;  . EYE SURGERY Bilateral    cataract surgery  . GIVENS CAPSULE STUDY N/A 11/04/2015   Procedure: GIVENS CAPSULE STUDY;  Surgeon: Rogene Houston, MD;  [facility];  Service: Endoscopy;  Laterality: N/A;  . Hysterscopy    . KNEE ARTHROSCOPY     Left  . NECK SURGERY     x 2   . TONSILLECTOMY    . TOTAL KNEE ARTHROPLASTY  07/11/2012   Procedure: TOTAL KNEE ARTHROPLASTY;  Surgeon: Ninetta Lights, MD;  Location: [facility];  Service: Orthopedics;  Laterality: Left;  left total knee arthroplasty  . UPPER GASTROINTESTINAL ENDOSCOPY    . YAG LASER APPLICATION Right 7/61/6073   Procedure: YAG LASER APPLICATION;  Surgeon:  Rutherford Guys, MD;  Location: [facility];  Service: Ophthalmology;  Laterality: Right;    Allergies  Allergen Reactions  . Nsaids Other (See Comments)    Caused Ulcers.  . Cephalexin     unknown  . Darvocet [Propoxyphene N-Acetaminophen]     nausea  . Percocet [Oxycodone-Acetaminophen] Nausea Only  . Penicillins Rash and Other (See Comments)    Has patient had a PCN reaction causing immediate rash, facial/tongue/throat swelling, SOB or lightheadedness with hypotension: Yes Has patient had a PCN reaction causing severe rash involving mucus membranes or skin necrosis: No Has patient had a PCN reaction that required hospitalization No Has patient had a PCN reaction  occurring within the last 10 years: Yes If all of the above answers are "NO", then may proceed with Cephalosporin use.     Current Outpatient Prescriptions on File Prior to Visit  Medication Sig Dispense Refill  . aspirin EC 81 MG tablet Take 81 mg by mouth every morning.    . Cholecalciferol (VITAMIN D) 2000 UNITS CAPS Take 1 capsule by mouth daily.      . diazepam (VALIUM) 5 MG tablet Take 5 mg by mouth every 8 (eight) hours as needed for anxiety.     . ferrous sulfate 325 (65 FE) MG tablet Take 1 tablet (325 mg total) by mouth daily with breakfast.    . gabapentin (NEURONTIN) 300 MG capsule Take 300 mg by mouth at bedtime.     . hydrochlorothiazide (HYDRODIURIL) 25 MG tablet Take 12.5 mg by mouth daily.    . metFORMIN (GLUCOPHAGE) 500 MG tablet Take 500 mg by mouth 2 (two) times daily with a meal.     . montelukast (SINGULAIR) 10 MG tablet Take 10 mg by mouth every morning.     Marland Kitchen omeprazole (PRILOSEC) 20 MG capsule TAKE 1 CAPSULE BY MOUTH TWICE DAILY BEFORE A MEAL (Patient taking differently: daily) 60 capsule 5  . potassium chloride (K-DUR) 10 MEQ tablet Take 1 tablet (10 mEq total) by mouth daily. 30 tablet 5  . TURMERIC PO Take 1 tablet by mouth daily.     No current facility-administered medications on file prior to visit.           Objective:   Physical Exam Blood pressure 132/70, pulse 60, temperature 97.6 F (36.4 C), height 5' 2.5" (1.588 m), weight 179 lb 14.4 oz (81.6 kg).  Alert and oriented. Skin warm and dry. Oral mucosa is moist.   . Sclera anicteric, conjunctivae is pink. Thyroid not enlarged. No cervical lymphadenopathy. Lungs clear. Heart regular rate and rhythm.  Abdomen is soft. Bowel sounds are positive. No hepatomegaly. No abdominal masses felt. No tenderness.  No edema to lower extremities.         Assessment & Plan:  Lower abdominal pain. Am going to get an [place] complete.  Further recommendations to follow.

## 2017-04-17 ENCOUNTER — Other Ambulatory Visit (INDEPENDENT_AMBULATORY_CARE_PROVIDER_SITE_OTHER): Payer: Self-pay | Admitting: Internal Medicine

## 2017-04-17 ENCOUNTER — Encounter (INDEPENDENT_AMBULATORY_CARE_PROVIDER_SITE_OTHER): Payer: Self-pay

## 2017-04-18 ENCOUNTER — Ambulatory Visit (HOSPITAL_COMMUNITY)
Admission: RE | Admit: 2017-04-18 | Discharge: 2017-04-18 | Disposition: A | Discharge status: Home / Self Care | Payer: Medicare Other | Source: Ambulatory Visit | Attending: Internal Medicine | Admitting: Internal Medicine

## 2017-04-18 DIAGNOSIS — K7689 Other specified diseases of liver: Secondary | ICD-10-CM | POA: Diagnosis not present

## 2017-04-18 DIAGNOSIS — N281 Cyst of kidney, acquired: Secondary | ICD-10-CM | POA: Insufficient documentation

## 2017-04-18 DIAGNOSIS — R1030 Lower abdominal pain, unspecified: Secondary | ICD-10-CM | POA: Insufficient documentation

## 2017-04-18 DIAGNOSIS — Z9049 Acquired absence of other specified parts of digestive tract: Secondary | ICD-10-CM | POA: Diagnosis not present

## 2017-05-02 DIAGNOSIS — H538 Other visual disturbances: Secondary | ICD-10-CM | POA: Diagnosis not present

## 2017-05-02 DIAGNOSIS — H524 Presbyopia: Secondary | ICD-10-CM | POA: Diagnosis not present

## 2017-05-02 DIAGNOSIS — Z961 Presence of intraocular lens: Secondary | ICD-10-CM | POA: Diagnosis not present

## 2017-05-02 DIAGNOSIS — H52203 Unspecified astigmatism, bilateral: Secondary | ICD-10-CM | POA: Diagnosis not present

## 2017-05-19 DIAGNOSIS — E1165 Type 2 diabetes mellitus with hyperglycemia: Secondary | ICD-10-CM | POA: Diagnosis not present

## 2017-05-19 DIAGNOSIS — E559 Vitamin D deficiency, unspecified: Secondary | ICD-10-CM | POA: Diagnosis not present

## 2017-05-19 DIAGNOSIS — E119 Type 2 diabetes mellitus without complications: Secondary | ICD-10-CM | POA: Diagnosis not present

## 2017-05-19 DIAGNOSIS — Z6832 Body mass index (BMI) 32.0-32.9, adult: Secondary | ICD-10-CM | POA: Diagnosis not present

## 2017-05-19 DIAGNOSIS — I10 Essential (primary) hypertension: Secondary | ICD-10-CM | POA: Diagnosis not present

## 2017-05-27 DIAGNOSIS — M25551 Pain in right hip: Secondary | ICD-10-CM | POA: Diagnosis not present

## 2017-05-27 DIAGNOSIS — M545 Low back pain: Secondary | ICD-10-CM | POA: Diagnosis not present

## 2017-05-29 DIAGNOSIS — M545 Low back pain: Secondary | ICD-10-CM | POA: Diagnosis not present

## 2017-05-31 DIAGNOSIS — J029 Acute pharyngitis, unspecified: Secondary | ICD-10-CM | POA: Diagnosis not present

## 2017-05-31 DIAGNOSIS — J019 Acute sinusitis, unspecified: Secondary | ICD-10-CM | POA: Diagnosis not present

## 2017-05-31 DIAGNOSIS — Z6832 Body mass index (BMI) 32.0-32.9, adult: Secondary | ICD-10-CM | POA: Diagnosis not present

## 2017-06-14 DIAGNOSIS — E119 Type 2 diabetes mellitus without complications: Secondary | ICD-10-CM | POA: Diagnosis not present

## 2017-06-14 DIAGNOSIS — H04123 Dry eye syndrome of bilateral lacrimal glands: Secondary | ICD-10-CM | POA: Diagnosis not present

## 2017-06-14 DIAGNOSIS — Z961 Presence of intraocular lens: Secondary | ICD-10-CM | POA: Diagnosis not present

## 2017-06-14 DIAGNOSIS — H52203 Unspecified astigmatism, bilateral: Secondary | ICD-10-CM | POA: Diagnosis not present

## 2017-07-13 ENCOUNTER — Ambulatory Visit (INDEPENDENT_AMBULATORY_CARE_PROVIDER_SITE_OTHER): Payer: Medicare Other | Admitting: Otolaryngology

## 2017-07-13 ENCOUNTER — Other Ambulatory Visit (INDEPENDENT_AMBULATORY_CARE_PROVIDER_SITE_OTHER): Payer: Self-pay | Admitting: Internal Medicine

## 2017-07-13 DIAGNOSIS — H9313 Tinnitus, bilateral: Secondary | ICD-10-CM

## 2017-07-13 DIAGNOSIS — H903 Sensorineural hearing loss, bilateral: Secondary | ICD-10-CM | POA: Diagnosis not present

## 2017-08-24 ENCOUNTER — Encounter (INDEPENDENT_AMBULATORY_CARE_PROVIDER_SITE_OTHER): Payer: Self-pay | Admitting: Internal Medicine

## 2017-09-04 DIAGNOSIS — E6609 Other obesity due to excess calories: Secondary | ICD-10-CM | POA: Diagnosis not present

## 2017-09-04 DIAGNOSIS — Z6834 Body mass index (BMI) 34.0-34.9, adult: Secondary | ICD-10-CM | POA: Diagnosis not present

## 2017-09-04 DIAGNOSIS — Z0000 Encounter for general adult medical examination without abnormal findings: Secondary | ICD-10-CM | POA: Diagnosis not present

## 2017-09-21 DIAGNOSIS — L820 Inflamed seborrheic keratosis: Secondary | ICD-10-CM | POA: Diagnosis not present

## 2017-09-21 DIAGNOSIS — L821 Other seborrheic keratosis: Secondary | ICD-10-CM | POA: Diagnosis not present

## 2017-09-21 DIAGNOSIS — X32XXXD Exposure to sunlight, subsequent encounter: Secondary | ICD-10-CM | POA: Diagnosis not present

## 2017-09-21 DIAGNOSIS — D225 Melanocytic nevi of trunk: Secondary | ICD-10-CM | POA: Diagnosis not present

## 2017-09-21 DIAGNOSIS — L570 Actinic keratosis: Secondary | ICD-10-CM | POA: Diagnosis not present

## 2017-09-26 DIAGNOSIS — Z23 Encounter for immunization: Secondary | ICD-10-CM | POA: Diagnosis not present

## 2017-10-09 ENCOUNTER — Other Ambulatory Visit (INDEPENDENT_AMBULATORY_CARE_PROVIDER_SITE_OTHER): Payer: Self-pay | Admitting: Internal Medicine

## 2017-10-24 ENCOUNTER — Encounter (INDEPENDENT_AMBULATORY_CARE_PROVIDER_SITE_OTHER): Payer: Self-pay | Admitting: Internal Medicine

## 2017-10-24 ENCOUNTER — Ambulatory Visit (INDEPENDENT_AMBULATORY_CARE_PROVIDER_SITE_OTHER): Payer: Medicare Other | Admitting: Internal Medicine

## 2017-10-24 ENCOUNTER — Other Ambulatory Visit (INDEPENDENT_AMBULATORY_CARE_PROVIDER_SITE_OTHER): Payer: Self-pay | Admitting: *Deleted

## 2017-10-24 VITALS — BP 112/70 | HR 66 | Temp 98.2°F | Resp 18 | Ht 62.5 in | Wt 187.8 lb

## 2017-10-24 DIAGNOSIS — K279 Peptic ulcer, site unspecified, unspecified as acute or chronic, without hemorrhage or perforation: Secondary | ICD-10-CM | POA: Diagnosis not present

## 2017-10-24 DIAGNOSIS — K219 Gastro-esophageal reflux disease without esophagitis: Secondary | ICD-10-CM | POA: Diagnosis not present

## 2017-10-24 DIAGNOSIS — R198 Other specified symptoms and signs involving the digestive system and abdomen: Secondary | ICD-10-CM

## 2017-10-24 DIAGNOSIS — Z862 Personal history of diseases of the blood and blood-forming organs and certain disorders involving the immune mechanism: Secondary | ICD-10-CM

## 2017-10-24 MED ORDER — SIMETHICONE 180 MG PO CAPS: 180.0000 mg | ORAL_CAPSULE | Freq: Three times a day (TID) | ORAL | 0 refills | Status: DC | PRN

## 2017-10-24 NOTE — Patient Instructions (Signed)
Can use Dulcolax or glycerin suppository on an as-needed basis. Please schedule visit with Dr. Glo Herring for pelvic exam.

## 2017-10-24 NOTE — Progress Notes (Signed)
Presenting complaint;  Follow-up for GERD. Patient complains of lower abdominal fullness.  Subjective:  Patient is 71 year old Caucasian female who has known moderate to large hiatal hernia, and history of iron deficiency anemia who is here for scheduled visit. She states she was doing well until last night when she developed heartburn regurgitation and throat symptoms after she ate pot roast.  She may have a an episode of heartburn once or twice a month but nothing like she had last night.  On most days she takes single dose of omeprazole.  She denies dysphagia nausea vomiting melena or rectal bleeding.  Her appetite is very good.  She has gained 8 pounds since her last visit 6 months ago.  She complains of lower abdominal pressure and fullness which is relieved with bowel movement or when she passes flatus.  He states she has not had pelvic exam in 5 years or so.  She has 4 inches of blocks under the head end of her bed.  She eats evening meal 3-4 hours before she goes to bed. She is not having any side effects with PPI.   Current Medications: Outpatient Encounter Prescriptions as of 10/24/2017  Medication Sig  . aspirin EC 81 MG tablet Take 81 mg by mouth every morning.  . Biotin 1 MG CAPS Take by mouth.  . Cholecalciferol (VITAMIN D) 2000 UNITS CAPS Take 1 capsule by mouth daily.    . diazepam (VALIUM) 5 MG tablet Take 5 mg by mouth every 8 (eight) hours as needed for anxiety.   . ferrous sulfate 325 (65 FE) MG tablet Take 1 tablet (325 mg total) by mouth daily with breakfast.  . gabapentin (NEURONTIN) 300 MG capsule Take 300 mg by mouth at bedtime.   . hydrochlorothiazide (HYDRODIURIL) 25 MG tablet Take 12.5 mg by mouth daily.  . metFORMIN (GLUCOPHAGE) 500 MG tablet Take 500 mg by mouth 2 (two) times daily with a meal.   . montelukast (SINGULAIR) 10 MG tablet Take 10 mg by mouth every morning.   Marland Kitchen omeprazole (PRILOSEC) 20 MG capsule TAKE 1 CAPSULE BY MOUTH TWICE DAILY BEFORE A MEAL  .  potassium chloride (K-DUR) 10 MEQ tablet TAKE 1 TABLET(10 MEQ) BY MOUTH DAILY  . TURMERIC PO Take 1 tablet by mouth daily.   No facility-administered encounter medications on file as of 10/24/2017.      Objective: Blood pressure 112/70, pulse 66, temperature 98.2 F (36.8 C), temperature source Oral, resp. rate 18, height 5' 2.5" (1.588 m), weight 187 lb 12.8 oz (85.2 kg). Patient is alert and in no acute distress. Conjunctiva is pink. Sclera is nonicteric Oropharyngeal mucosa is normal. No neck masses or thyromegaly noted. Cardiac exam with regular rhythm normal S1 and S2. No murmur or gallop noted. Lungs are clear to auscultation. Abdomen is full.  Bowel sounds are normal.  On palpation abdomen is soft and nontender without organomegaly or masses. No LE edema or clubbing noted.  Labs/studies Results:  H&H was 14.0 and 42.2 on 09/12/2016.  Assessment:  #1.  GERD.  She has known large hiatal hernia.  Symptoms for most part have been well controlled with anti-reflux measures and PPI.  She has sporadic breakthrough symptoms with certain foods like she had last night when she ate pot roast.  EGD in 2016 was negative for Barrett's esophagus.  She is not keen on having anti-reflux surgery.  Will continue medical therapy as long as it is working and she does not have any side effects.  #2.  History of iron deficiency anemia.  It was documented in 2016 and prior to that.  Iron deficiency anemia felt to be secondary to loss from upper GI tract and perhaps impaired iron absorption due to chronic PPI therapy.  #3.  Lower abdominal bloating.  She may have an element of IBS.  Pelvic exam needs to be performed to rule out ovarian cyst etc.   Plan:  Patient will contact Dr. Johnnye Sima office to arrange for pelvic examination. Patient will go to the lab for CBC. Phazyme 180 mg p.o. 3 times daily as needed. Patient can use Dulcolax or glycerin suppository for constipation on as-needed  basis. Office visit in 6 months.

## 2017-10-25 LAB — CBC
Hematocrit: 44.3 % (ref 34.0–46.6)
Hemoglobin: 14.4 g/dL (ref 11.1–15.9)
MCH: 29.8 pg (ref 26.6–33.0)
MCHC: 32.5 g/dL (ref 31.5–35.7)
MCV: 92 fL (ref 79–97)
PLATELETS: 278 10*3/uL (ref 150–379)
RBC: 4.84 x10E6/uL (ref 3.77–5.28)
RDW: 14.3 % (ref 12.3–15.4)
WBC: 8.4 10*3/uL (ref 3.4–10.8)

## 2017-11-01 ENCOUNTER — Other Ambulatory Visit: Payer: Self-pay

## 2017-11-01 ENCOUNTER — Observation Stay (HOSPITAL_COMMUNITY)
Admission: EM | Admit: 2017-11-01 | Discharge: 2017-11-03 | Disposition: A | Discharge status: Home / Self Care | Payer: Medicare Other | Attending: Internal Medicine | Admitting: Internal Medicine

## 2017-11-01 ENCOUNTER — Encounter (HOSPITAL_COMMUNITY): Payer: Self-pay | Admitting: Emergency Medicine

## 2017-11-01 ENCOUNTER — Emergency Department (HOSPITAL_COMMUNITY): Payer: Medicare Other

## 2017-11-01 DIAGNOSIS — E119 Type 2 diabetes mellitus without complications: Secondary | ICD-10-CM

## 2017-11-01 DIAGNOSIS — D649 Anemia, unspecified: Secondary | ICD-10-CM | POA: Diagnosis present

## 2017-11-01 DIAGNOSIS — J45909 Unspecified asthma, uncomplicated: Secondary | ICD-10-CM | POA: Diagnosis not present

## 2017-11-01 DIAGNOSIS — Z7982 Long term (current) use of aspirin: Secondary | ICD-10-CM | POA: Diagnosis not present

## 2017-11-01 DIAGNOSIS — I10 Essential (primary) hypertension: Secondary | ICD-10-CM | POA: Diagnosis not present

## 2017-11-01 DIAGNOSIS — R079 Chest pain, unspecified: Secondary | ICD-10-CM | POA: Diagnosis present

## 2017-11-01 DIAGNOSIS — F419 Anxiety disorder, unspecified: Secondary | ICD-10-CM | POA: Diagnosis not present

## 2017-11-01 DIAGNOSIS — Z7984 Long term (current) use of oral hypoglycemic drugs: Secondary | ICD-10-CM | POA: Diagnosis not present

## 2017-11-01 DIAGNOSIS — K219 Gastro-esophageal reflux disease without esophagitis: Secondary | ICD-10-CM | POA: Diagnosis not present

## 2017-11-01 DIAGNOSIS — R2243 Localized swelling, mass and lump, lower limb, bilateral: Secondary | ICD-10-CM | POA: Diagnosis not present

## 2017-11-01 DIAGNOSIS — I200 Unstable angina: Principal | ICD-10-CM | POA: Insufficient documentation

## 2017-11-01 DIAGNOSIS — R600 Localized edema: Secondary | ICD-10-CM

## 2017-11-01 DIAGNOSIS — Z79899 Other long term (current) drug therapy: Secondary | ICD-10-CM | POA: Insufficient documentation

## 2017-11-01 HISTORY — DX: Iron deficiency anemia, unspecified: D50.9

## 2017-11-01 HISTORY — DX: Type 2 diabetes mellitus without complications: E11.9

## 2017-11-01 HISTORY — DX: Personal history of other diseases of the circulatory system: Z86.79

## 2017-11-01 HISTORY — DX: Gastric ulcer, unspecified as acute or chronic, without hemorrhage or perforation: K25.9

## 2017-11-01 LAB — CBC
HEMATOCRIT: 43.1 % (ref 36.0–46.0)
HEMOGLOBIN: 14.4 g/dL (ref 12.0–15.0)
MCH: 30.8 pg (ref 26.0–34.0)
MCHC: 33.4 g/dL (ref 30.0–36.0)
MCV: 92.1 fL (ref 78.0–100.0)
Platelets: 242 10*3/uL (ref 150–400)
RBC: 4.68 MIL/uL (ref 3.87–5.11)
RDW: 14.2 % (ref 11.5–15.5)
WBC: 9.7 10*3/uL (ref 4.0–10.5)

## 2017-11-01 LAB — BASIC METABOLIC PANEL
ANION GAP: 10 (ref 5–15)
BUN: 30 mg/dL — ABNORMAL HIGH (ref 6–20)
CO2: 27 mmol/L (ref 22–32)
Calcium: 9.9 mg/dL (ref 8.9–10.3)
Chloride: 103 mmol/L (ref 101–111)
Creatinine, Ser: 0.98 mg/dL (ref 0.44–1.00)
GFR calc Af Amer: >60 mL/min (ref >60)
GFR, EST NON AFRICAN AMERICAN: 57 mL/min — AB (ref >60)
Glucose, Bld: 113 mg/dL — ABNORMAL HIGH (ref 65–99)
POTASSIUM: 3.8 mmol/L (ref 3.5–5.1)
SODIUM: 140 mmol/L (ref 135–145)

## 2017-11-01 LAB — I-STAT TROPONIN, ED: Troponin i, poc: 0 ng/mL (ref 0.00–0.08)

## 2017-11-01 MED ORDER — NITROGLYCERIN 0.4 MG SL SUBL
0.4000 mg | SUBLINGUAL_TABLET | SUBLINGUAL | Status: DC | PRN
  Administered 2017-11-01: 0.4 mg via SUBLINGUAL
  Filled 2017-11-01: qty 1

## 2017-11-01 MED ORDER — ACETAMINOPHEN 325 MG PO TABS: 650.0000 mg | ORAL_TABLET | Freq: Once | ORAL | Status: DC

## 2017-11-01 NOTE — ED Triage Notes (Signed)
Pt reports CP since waking yesterday, central chest, across shoulders, and in back. Reports SOB and dizziness.

## 2017-11-01 NOTE — ED Provider Notes (Signed)
[facility] EMERGENCY DEPARTMENT Provider Note   CSN: [id number] Arrival date & time: 11/01/17  2240     History   Chief Complaint Chief Complaint  Patient presents with  . Chest Pain    HPI [name] is a 71 y.o. female.  The history is provided by the patient and the spouse.  Chest Pain   This is a new problem. The current episode started yesterday. The problem occurs constantly. The problem has been gradually improving. The pain is associated with exertion. The pain is present in the substernal region. The pain is moderate. The pain does not radiate. Associated symptoms include dizziness and shortness of breath. Pertinent negatives include no abdominal pain, no fever, no lower extremity edema, no syncope and no vomiting. She has tried rest for the symptoms. The treatment provided moderate relief. Risk factors include being elderly.  Her past medical history is significant for diabetes.  Pertinent negatives for past medical history include no CAD.  pt reports onset of CP yesterday while walking upstairs She reports she felt her heart racing and short of breath at th time The CP has improved but still present She also endorses shoulder pain but this is not new but seems to be worsening No pleuritic CP reported  Past Medical History:  Diagnosis Date  . Anemia    takes iron  . Anginal pain (HCC)    cardiac clearance on the chart-pt also has GI promblems  . Arthritis   . Asthma   . Carpal tunnel syndrome    bilateral- wears splints  . Chest pain 01/05/2015   MI ruled out.  . Chronic anxiety   . GERD (gastroesophageal reflux disease)   . H/O hiatal hernia    'Sliding"  . Irregular heart beat   . Murmur     Since Birth followed by Dr Verl Blalock.  . 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 in left ear.  Left  side of body weaker than right.  Carpal tunnel bil .  Marland Kitchen Ulcer    gastric    Patient Active Problem List   Diagnosis  Date Noted  . Diabetes ([place]) 03/28/2016  . Pain in the chest   . GERD (gastroesophageal reflux disease)   . Chest pain 01/05/2015  . Neuropathy 01/05/2015  . Bronchitis   . 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 Chest pain at rest 11/14/2013  . Left knee pain 09/18/2013  . Salmonella 05/24/2013  . Thrombocytopenia, unspecified ([place]) 05/24/2013  . Acute gastroenteritis 05/21/2013  . Fever 05/21/2013  . Abnormal urinalysis 05/21/2013  . Hypokalemia 05/21/2013  . Dehydration 05/21/2013  . Coccydynia 05/07/2013  . Anemia 08/06/2012  . PUD (peptic ulcer disease) 02/06/2012  . OVERWEIGHT/OBESITY 12/21/2010  . Essential hypertension 12/29/2009  . Asthma 12/29/2009  . GERD 12/29/2009  . HIATAL HERNIA 12/29/2009  . HEART MURMUR, SYSTOLIC 40/34/7425    Past Surgical History:  Procedure Laterality Date  . APPENDECTOMY    . CHOLECYSTECTOMY    . COLONOSCOPY    . EYE SURGERY Bilateral    cataract surgery  . Hysterscopy    . KNEE ARTHROSCOPY     Left  . NECK SURGERY     x 2   . TONSILLECTOMY    . UPPER GASTROINTESTINAL ENDOSCOPY      OB History    Gravida Para Term Preterm AB Living             1  SAB TAB Ectopic Multiple Live Births                   Home Medications    Prior to Admission medications   Medication Sig Start Date End Date Taking? Authorizing Provider  aspirin EC 81 MG tablet Take 81 mg by mouth every morning.    [provider]  Biotin 1 MG CAPS Take by mouth.    [provider]  Cholecalciferol (VITAMIN D) 2000 UNITS CAPS Take 1 capsule by mouth daily.      [provider]  diazepam (VALIUM) 5 MG tablet Take 5 mg by mouth every 8 (eight) hours as needed for anxiety.     [provider]  ferrous sulfate 325 (65 FE) MG tablet Take 1 tablet (325 mg total) by mouth daily with breakfast. 10/25/16   Rehman, Mechele Dawley, MD  gabapentin (NEURONTIN) 300 MG capsule Take 300 mg by mouth at bedtime.      [provider]  hydrochlorothiazide (HYDRODIURIL) 25 MG tablet Take 12.5 mg by mouth daily.    [provider]  metFORMIN (GLUCOPHAGE) 500 MG tablet Take 500 mg by mouth 2 (two) times daily with a meal.     [provider]  montelukast (SINGULAIR) 10 MG tablet Take 10 mg by mouth every morning.     [provider]  omeprazole (PRILOSEC) 20 MG capsule TAKE 1 CAPSULE BY MOUTH TWICE DAILY BEFORE A MEAL 07/15/17   Rehman, Mechele Dawley, MD  potassium chloride (K-DUR) 10 MEQ tablet TAKE 1 TABLET(10 MEQ) BY MOUTH DAILY 10/09/17   Rehman, Mechele Dawley, MD  Simethicone 180 MG CAPS Take 1 capsule (180 mg total) by mouth 3 (three) times daily as needed. 10/24/17   Rogene Houston, MD  TURMERIC PO Take 1 tablet by mouth daily.    [provider]    Family History Family History  Problem Relation Age of Onset  . Hypertension Son   . Alzheimer's disease Mother   . Kidney disease Mother   . Kidney disease Father   . Cancer Maternal Aunt        leukemia  . Liver disease Maternal Uncle   . Kidney disease Paternal Uncle        kidney removed  . Diabetes Maternal Grandmother   . Diabetes Maternal Grandfather   . Cancer Paternal Grandmother        breast, liver  . Colon cancer Neg Hx     Social History Social History   Tobacco Use  . Smoking status: Never Smoker  . Smokeless tobacco: Never Used  Substance Use Topics  . Alcohol use: No    Alcohol/week: 0.0 oz  . Drug use: No     Allergies   Nsaids; Cephalexin; Darvocet [propoxyphene n-acetaminophen]; Percocet [oxycodone-acetaminophen]; and Penicillins   Review of Systems Review of Systems  Constitutional: Negative for fever.  Respiratory: Positive for shortness of breath.   Cardiovascular: Positive for chest pain. Negative for syncope.  Gastrointestinal: Negative for abdominal pain and vomiting.  Neurological: Positive for dizziness. Negative for syncope.  All other systems reviewed and are  negative.    Physical Exam Updated Vital Signs BP (!) 154/71   Pulse 75   Temp 97.6 F (36.4 C) (Oral)   Resp 15   Ht 1.588 m (5' 2.5")   Wt 83.9 kg (185 lb)   SpO2 99%   BMI 33.30 kg/m   Physical Exam CONSTITUTIONAL: Elderly, ill appearing HEAD: Normocephalic/atraumatic EYES: EOMI/PERRL ENMT:  Mucous membranes moist, lips are pale NECK: supple no meningeal signs SPINE/BACK:entire spine nontender CV: S1/S2 noted, murmur noted LUNGS: Lungs are clear to auscultation bilaterally, no apparent distress ABDOMEN: soft, nontender, no rebound or guarding, bowel sounds noted throughout abdomen GU:no cva tenderness NEURO: Pt is awake/alert/appropriate, moves all extremitiesx4.  No facial droop.   EXTREMITIES: pulses normal/equalx4, full ROM, no significant LE edema noted SKIN: warm, color normal PSYCH: no abnormalities of mood noted, alert and oriented to situation   ED Treatments / Results  Labs (all labs ordered are listed, but only abnormal results are displayed) Labs Reviewed  BASIC METABOLIC PANEL - Abnormal; Notable for the following components:      Result Value   Glucose, Bld 113 (*)    BUN 30 (*)    GFR calc non Af Amer 57 (*)    All other components within normal limits  CBC  I-STAT TROPONIN, ED    EKG  EKG Interpretation  Date/Time:  Wednesday November 01 2017 22:48:57 EST Ventricular Rate:  78 PR Interval:    QRS Duration: 104 QT Interval:  387 QTC Calculation: 441 R Axis:   97 Text Interpretation:  Sinus rhythm Low voltage, precordial leads Consider right ventricular hypertrophy No significant change since last tracing Confirmed by Merrily Pew [phone]) on 11/01/2017 10:58:58 PM       Radiology Dg Chest 2 View  Result Date: 11/02/2017 CLINICAL DATA:  Chest pain EXAM: CHEST  2 VIEW COMPARISON:  02/23/2017 FINDINGS: Lower cervical hardware. No acute consolidation or effusion. Normal heart size. Moderate hiatal hernia. Stable left lower lung nodule. No  pneumothorax. IMPRESSION: No active cardiopulmonary disease.  Moderate hiatal hernia Electronically Signed   By: Donavan Foil M.D.   On: 11/02/2017 00:11    Procedures Procedures    Medications Ordered in ED Medications  nitroGLYCERIN (NITROSTAT) SL tablet 0.4 mg (0.4 mg Sublingual Given 11/01/17 2335)  acetaminophen (TYLENOL) tablet 650 mg (650 mg Oral Not Given 11/02/17 0010)     Initial Impression / Assessment and Plan / ED Course  I have reviewed the triage vital signs and the nursing notes.  Pertinent labs & imaging results that were available during my care of the patient were reviewed by me and considered in my medical decision making (see chart for details).     12:01 AM Pt had CP relief with NTG She reports allergy to ASA Will need admission HEART score - 4 Doubt PE/Dissection at this time given history/exam 12:28 AM Pt had significant CP relief with NTG She appears improved, resting more comfortably Will admit due to age/risk factors/history D/w Dr Aggie Moats for admission Pt/family agreeable with plan   Final Clinical Impressions(s) / ED Diagnoses   Final diagnoses:  Unstable angina [facility])    ED Discharge Orders    None       Ripley Fraise, MD 11/02/17 [phone]

## 2017-11-01 NOTE — ED Notes (Signed)
1 tablet 0.4 mg of nitroglycerin given. After 5 minutes pt states she no longer has chest pain but not has a headache. EDP notified

## 2017-11-02 ENCOUNTER — Encounter (HOSPITAL_COMMUNITY): Payer: Self-pay | Admitting: Cardiology

## 2017-11-02 ENCOUNTER — Observation Stay (HOSPITAL_BASED_OUTPATIENT_CLINIC_OR_DEPARTMENT_OTHER): Payer: Medicare Other

## 2017-11-02 ENCOUNTER — Observation Stay (HOSPITAL_COMMUNITY): Payer: Medicare Other

## 2017-11-02 DIAGNOSIS — R072 Precordial pain: Secondary | ICD-10-CM | POA: Diagnosis not present

## 2017-11-02 DIAGNOSIS — D649 Anemia, unspecified: Secondary | ICD-10-CM

## 2017-11-02 DIAGNOSIS — I340 Nonrheumatic mitral (valve) insufficiency: Secondary | ICD-10-CM | POA: Diagnosis not present

## 2017-11-02 DIAGNOSIS — K449 Diaphragmatic hernia without obstruction or gangrene: Secondary | ICD-10-CM | POA: Diagnosis not present

## 2017-11-02 DIAGNOSIS — E119 Type 2 diabetes mellitus without complications: Secondary | ICD-10-CM | POA: Diagnosis not present

## 2017-11-02 DIAGNOSIS — J45909 Unspecified asthma, uncomplicated: Secondary | ICD-10-CM

## 2017-11-02 DIAGNOSIS — R0789 Other chest pain: Secondary | ICD-10-CM

## 2017-11-02 DIAGNOSIS — I10 Essential (primary) hypertension: Secondary | ICD-10-CM

## 2017-11-02 DIAGNOSIS — I200 Unstable angina: Secondary | ICD-10-CM | POA: Diagnosis not present

## 2017-11-02 DIAGNOSIS — R079 Chest pain, unspecified: Secondary | ICD-10-CM | POA: Diagnosis not present

## 2017-11-02 DIAGNOSIS — K219 Gastro-esophageal reflux disease without esophagitis: Secondary | ICD-10-CM | POA: Diagnosis not present

## 2017-11-02 DIAGNOSIS — R600 Localized edema: Secondary | ICD-10-CM | POA: Diagnosis not present

## 2017-11-02 LAB — ECHOCARDIOGRAM COMPLETE
AVLVOTPG: 6 mmHg
CHL CUP MV DEC (S): 236
CHL CUP STROKE VOLUME: 25 mL
CHL CUP TV REG PEAK VELOCITY: 248 cm/s
E decel time: 236 msec
EERAT: 6.58
FS: 37 % (ref 28–44)
HEIGHTINCHES: 62 in
IVS/LV PW RATIO, ED: 1.04
LA diam end sys: 22 mm
LA diam index: 1.12 cm/m2
LA vol A4C: 51.9 ml
LA vol index: 25.3 mL/m2
LA vol: 49.7 mL
LASIZE: 22 mm
LDCA: 2.27 cm2
LV E/e'average: 6.58
LV TDI E'LATERAL: 19
LV dias vol index: 21 mL/m2
LV sys vol: 17 mL
LVDIAVOL: 42 mL — AB (ref 46–106)
LVEEMED: 6.58
LVELAT: 19 cm/s
LVOT SV: 68 mL
LVOT VTI: 30 cm
LVOT diameter: 17 mm
LVOTPV: 124 cm/s
LVSYSVOLIN: 9 mL/m2
MV Peak grad: 6 mmHg
MV pk A vel: 132 m/s
MV pk E vel: 125 m/s
PW: 9.95 mm — AB (ref 0.6–1.1)
RV LATERAL S' VELOCITY: 11.1 cm/s
RV sys press: 28 mmHg
Simpson's disk: 59
TAPSE: 18.1 mm
TDI e' medial: 6.42
TR max vel: 248 cm/s
WEIGHTICAEL: 3003.55 [oz_av]

## 2017-11-02 LAB — GLUCOSE, CAPILLARY
GLUCOSE-CAPILLARY: 123 mg/dL — AB (ref 65–99)
Glucose-Capillary: 119 mg/dL — ABNORMAL HIGH (ref 65–99)
Glucose-Capillary: 101 mg/dL — ABNORMAL HIGH (ref 65–99)

## 2017-11-02 LAB — TROPONIN I

## 2017-11-02 LAB — HEMOGLOBIN A1C
HEMOGLOBIN A1C: 6.1 % — AB (ref 4.8–5.6)
MEAN PLASMA GLUCOSE: 128.37 mg/dL

## 2017-11-02 MED ORDER — ASPIRIN EC 81 MG PO TBEC
81.0000 mg | DELAYED_RELEASE_TABLET | Freq: Every day | ORAL | Status: DC
  Administered 2017-11-02 – 2017-11-03 (×2): 81 mg via ORAL
  Filled 2017-11-02 (×2): qty 1

## 2017-11-02 MED ORDER — GABAPENTIN 300 MG PO CAPS
300.0000 mg | ORAL_CAPSULE | Freq: Every day | ORAL | Status: DC
  Administered 2017-11-02: 300 mg via ORAL
  Filled 2017-11-02: qty 1

## 2017-11-02 MED ORDER — HEPARIN SODIUM (PORCINE) 5000 UNIT/ML IJ SOLN
5000.0000 [IU] | Freq: Three times a day (TID) | INTRAMUSCULAR | Status: DC
  Administered 2017-11-02 – 2017-11-03 (×4): 5000 [IU] via SUBCUTANEOUS
  Filled 2017-11-02 (×4): qty 1

## 2017-11-02 MED ORDER — ACETAMINOPHEN 325 MG PO TABS
650.0000 mg | ORAL_TABLET | ORAL | Status: DC | PRN
  Administered 2017-11-02: 650 mg via ORAL
  Filled 2017-11-02: qty 2

## 2017-11-02 MED ORDER — PANTOPRAZOLE SODIUM 40 MG PO TBEC
40.0000 mg | DELAYED_RELEASE_TABLET | Freq: Every day | ORAL | Status: DC
  Administered 2017-11-02 – 2017-11-03 (×2): 40 mg via ORAL
  Filled 2017-11-02 (×2): qty 1

## 2017-11-02 MED ORDER — INSULIN ASPART 100 UNIT/ML ~~LOC~~ SOLN
0.0000 [IU] | Freq: Three times a day (TID) | SUBCUTANEOUS | Status: DC
  Administered 2017-11-03: 2 [IU] via SUBCUTANEOUS

## 2017-11-02 MED ORDER — DIAZEPAM 5 MG PO TABS: 5.0000 mg | ORAL_TABLET | Freq: Three times a day (TID) | ORAL | Status: DC | PRN

## 2017-11-02 MED ORDER — ALBUTEROL SULFATE (2.5 MG/3ML) 0.083% IN NEBU: 2.5000 mg | INHALATION_SOLUTION | RESPIRATORY_TRACT | Status: DC | PRN

## 2017-11-02 MED ORDER — HYDROCHLOROTHIAZIDE 25 MG PO TABS
12.5000 mg | ORAL_TABLET | Freq: Every day | ORAL | Status: DC
  Administered 2017-11-02 – 2017-11-03 (×2): 12.5 mg via ORAL
  Filled 2017-11-02 (×2): qty 1

## 2017-11-02 MED ORDER — FERROUS SULFATE 325 (65 FE) MG PO TABS
325.0000 mg | ORAL_TABLET | Freq: Every day | ORAL | Status: DC
  Administered 2017-11-02 – 2017-11-03 (×2): 325 mg via ORAL
  Filled 2017-11-02 (×2): qty 1

## 2017-11-02 MED ORDER — ONDANSETRON HCL 4 MG/2ML IJ SOLN: 4.0000 mg | Freq: Four times a day (QID) | INTRAMUSCULAR | Status: DC | PRN

## 2017-11-02 MED ORDER — MORPHINE SULFATE (PF) 2 MG/ML IV SOLN: 2.0000 mg | INTRAVENOUS | Status: DC | PRN

## 2017-11-02 MED ORDER — HYDRALAZINE HCL 20 MG/ML IJ SOLN: 10.0000 mg | Freq: Three times a day (TID) | INTRAMUSCULAR | Status: DC | PRN

## 2017-11-02 NOTE — H&P (Signed)
[facility] History and Physical  [name] UMP:[id number] DOB: 1946/05/01 DOA: 11/01/2017  Referring physician:  PCP: Sharilyn Sites, MD   Chief Complaint: chest pain  HPI: [name] is a 71 y.o. female with past medical history significant for asthma, anxiety, reflux, heart murmur who presents to the emergency room with chief complaint of chest pain.  Patient denies any abdominal trauma.  No recent cough.  States she had acute onset of chest pain that was mostly brought on by exertion.  Progressed to resting chest pain.  Pain was throughout patient's chest and back.  No nausea or vomiting.  Denies any diaphoresis.  No patient states she is allergic to aspirin due to history of ulcers.   Review of Systems:  As per HPI otherwise 10 point review of systems negative.    Past Medical History:  Diagnosis Date  . Anemia    takes iron  . Anginal pain (HCC)    cardiac clearance on the chart-pt also has GI promblems  . Arthritis   . Asthma   . Carpal tunnel syndrome    bilateral- wears splints  . Chest pain 01/05/2015   MI ruled out.  . Chronic anxiety   . GERD (gastroesophageal reflux disease)   . H/O hiatal hernia    'Sliding"  . Irregular heart beat   . Murmur     Since Birth followed by Dr Verl Blalock.  . 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 in left ear.  Left  side of body weaker than right.  Carpal tunnel bil .  Marland Kitchen Ulcer    gastric   Past Surgical History:  Procedure Laterality Date  . APPENDECTOMY    . CHOLECYSTECTOMY    . COLONOSCOPY    . EYE SURGERY Bilateral    cataract surgery  . Hysterscopy    . KNEE ARTHROSCOPY     Left  . NECK SURGERY     x 2   . TONSILLECTOMY    . UPPER GASTROINTESTINAL ENDOSCOPY     Social History:  reports that  has never smoked. she has never used smokeless tobacco. She reports that she does not drink alcohol or use drugs.  Allergies  Allergen Reactions  . Nsaids Other (See  Comments)    Caused Ulcers.  . Cephalexin     unknown  . Darvocet [Propoxyphene N-Acetaminophen]     nausea  . Percocet [Oxycodone-Acetaminophen] Nausea Only  . Penicillins Rash and Other (See Comments)    Has patient had a PCN reaction causing immediate rash, facial/tongue/throat swelling, SOB or lightheadedness with hypotension: Yes Has patient had a PCN reaction causing severe rash involving mucus membranes or skin necrosis: No Has patient had a PCN reaction that required hospitalization No Has patient had a PCN reaction occurring within the last 10 years: Yes If all of the above answers are "NO", then may proceed with Cephalosporin use.     Family History  Problem Relation Age of Onset  . Hypertension Son   . Alzheimer's disease Mother   . Kidney disease Mother   . Kidney disease Father   . Cancer Maternal Aunt        leukemia  . Liver disease Maternal Uncle   . Kidney disease Paternal Uncle        kidney removed  . Diabetes Maternal Grandmother   . Diabetes Maternal Grandfather   . Cancer Paternal Grandmother        breast, liver  .  Colon cancer Neg Hx      Prior to Admission medications   Medication Sig Start Date End Date Taking? Authorizing Provider  aspirin EC 81 MG tablet Take 81 mg by mouth every morning.    [provider]  Biotin 1 MG CAPS Take by mouth.    [provider]  Cholecalciferol (VITAMIN D) 2000 UNITS CAPS Take 1 capsule by mouth daily.      [provider]  diazepam (VALIUM) 5 MG tablet Take 5 mg by mouth every 8 (eight) hours as needed for anxiety.     [provider]  ferrous sulfate 325 (65 FE) MG tablet Take 1 tablet (325 mg total) by mouth daily with breakfast. 10/25/16   Rehman, Mechele Dawley, MD  gabapentin (NEURONTIN) 300 MG capsule Take 300 mg by mouth at bedtime.     [provider]  hydrochlorothiazide (HYDRODIURIL) 25 MG tablet Take 12.5 mg by mouth daily.    [provider]  metFORMIN  (GLUCOPHAGE) 500 MG tablet Take 500 mg by mouth 2 (two) times daily with a meal.     [provider]  montelukast (SINGULAIR) 10 MG tablet Take 10 mg by mouth every morning.     [provider]  omeprazole (PRILOSEC) 20 MG capsule TAKE 1 CAPSULE BY MOUTH TWICE DAILY BEFORE A MEAL 07/15/17   Rehman, Mechele Dawley, MD  potassium chloride (K-DUR) 10 MEQ tablet TAKE 1 TABLET(10 MEQ) BY MOUTH DAILY 10/09/17   Rehman, Mechele Dawley, MD  Simethicone 180 MG CAPS Take 1 capsule (180 mg total) by mouth 3 (three) times daily as needed. 10/24/17   Rogene Houston, MD  TURMERIC PO Take 1 tablet by mouth daily.    [provider]   Physical Exam: Vitals:   11/01/17 2258 11/01/17 2300 11/01/17 2330 11/02/17 0100  BP: (!) 154/79 (!) 149/67 (!) 154/71 (!) 123/52  Pulse: 77 78 75   Resp: 16 15 15 19   Temp: 97.6 F (36.4 C)     TempSrc: Oral     SpO2: 98% 98% 99%   Weight:      Height:        Wt Readings from Last 3 Encounters:  11/01/17 83.9 kg (185 lb)  10/24/17 85.2 kg (187 lb 12.8 oz)  04/12/17 81.6 kg (179 lb 14.4 oz)    General:  Appears calm and comfortable Eyes:  PERRL, EOMI, normal lids, iris ENT:  grossly normal hearing, lips & tongue Neck:  no LAD, masses or thyromegaly Cardiovascular:  RRR, pos murmur,  no r/g. No LE edema.  Respiratory:  CTA bilaterally, no w/r/r. Normal respiratory effort. Abdomen:  soft, ntnd Skin:  no rash or induration seen on limited exam Musculoskeletal:  grossly normal tone BUE/BLE Psychiatric:  grossly normal mood and affect, speech fluent and appropriate Neurologic:  CN 2-12 grossly intact, moves all extremities in coordinated fashion.          Labs on Admission:  Basic Metabolic Panel: Recent Labs  Lab 11/01/17 2249  NA 140  K 3.8  CL 103  CO2 27  GLUCOSE 113*  BUN 30*  CREATININE 0.98  CALCIUM 9.9   Liver Function Tests: No results for input(s): AST, ALT, ALKPHOS, BILITOT, PROT, ALBUMIN in the last 168 hours. No results  for input(s): LIPASE, AMYLASE in the last 168 hours. No results for input(s): AMMONIA in the last 168 hours. CBC: Recent Labs  Lab 11/01/17 2249  WBC 9.7  HGB 14.4  HCT 43.1  MCV 92.1  PLT 242   Cardiac Enzymes: No results for input(s): CKTOTAL, CKMB, CKMBINDEX, TROPONINI in the last 168 hours.  BNP (last 3 results) No results for input(s): BNP in the last 8760 hours.  ProBNP (last 3 results) No results for input(s): PROBNP in the last 8760 hours.   Serum creatinine: 0.98 mg/dL 11/01/17 2249 Estimated creatinine clearance: 53.4 mL/min  CBG: No results for input(s): GLUCAP in the last 168 hours.  Radiological Exams on Admission: Dg Chest 2 View  Result Date: 11/02/2017 CLINICAL DATA:  Chest pain EXAM: CHEST  2 VIEW COMPARISON:  02/23/2017 FINDINGS: Lower cervical hardware. No acute consolidation or effusion. Normal heart size. Moderate hiatal hernia. Stable left lower lung nodule. No pneumothorax. IMPRESSION: No active cardiopulmonary disease.  Moderate hiatal hernia Electronically Signed   By: Donavan Foil M.D.   On: 11/02/2017 00:11    EKG: Independently reviewed. NSR. No stemi,  Assessment/Plan Principal Problem:   Chest pain Active Problems:   Asthma   GERD   Anemia   1) CP - serial trop ordered, initial neg - prn EKG CP - prn moprhine CP - prn ntg cp - asa in ED and QD - ECHO ordered for AM - tele bed, cardiac monitoring - zofran prn for nausea   Asthma Prn albuterol  Anemia Cont iron   Chronic Anxiety No SI/HI Cont prn valium  Chronic pain Cont gabapentin  Hypertension When necessary hydralazine 10 mg IV as needed for severe blood pressure Cont hctz  DM SSI AC  GERD Cont PPI  Code Status: FC  DVT Prophylaxis: lovenox Family Communication: husband not available Disposition Plan: Pending Improvement  Status: tele obs  Elwin Mocha, MD Family Medicine [facility] [email or web address] Password [email or web address]

## 2017-11-02 NOTE — Care Management Obs Status (Signed)
[place] NOTIFICATION   Patient Details  Name: [name] MRN: [id number] Date of Birth: 1946/10/24   Medicare Observation Status Notification Given:  Yes    Kentavious Michele, Chauncey Reading, RN 11/02/2017, 2:24 PM

## 2017-11-02 NOTE — Progress Notes (Signed)
*  PRELIMINARY RESULTS* Echocardiogram 2D Echocardiogram has been performed.  Samuel Germany 11/02/2017, 10:39 AM

## 2017-11-02 NOTE — Consult Note (Signed)
Cardiology Consultation:   Patient ID: [name]; [id number]; [birth year]   Admit date: 11/01/2017 Date of Consult: 11/02/2017  Primary Care Provider: Sharilyn Sites, MD Consulting Cardiologist: Dr. Satira Sark   Patient Profile:   [name] is a 71 y.o. female with a history of hypertension, type 2 diabetes mellitus, GERD, and a moderate to large hiatal hernia who is being seen today for the evaluation of chest pain at the request of Dr Tat.  History of Present Illness:   Ms. Herard presents to the hospital complaining of a 2-day history of recurring chest tightness.  She states that this began when she climbed a few flight of steps, had to stop due to shortness of breath and chest tightness.  Since then she has been experiencing recurring moderate chest tightness with various levels of activity, also relative fatigue.  She has radiation of discomfort into her shoulders and upper back.  She thought that it was potentially related to reflux and her known hiatal hernia, but not all symptoms are similar.  She states that she underwent a cardiac evaluation 5-10 years ago and has no known history of CAD.  She does have intermittent reflux symptoms on Prilosec and follows with Dr. Laural Golden.  History also includes long-standing heart murmur.  Follow-up echocardiogram today shows preserved LVEF at 60-65% with mild mitral and tricuspid regurgitation.  Past Medical History:  Diagnosis Date  . Arthritis   . Asthma   . Carpal tunnel syndrome   . Chronic anxiety   . Gastric ulcer   . GERD (gastroesophageal reflux disease)   . H/O hiatal hernia   . History of heart murmur in childhood   . Hypertension   . Iron deficiency anemia   . 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 in left ear.  Left  side of body weaker than right.  Carpal tunnel bil .  Marland Kitchen Type 2 diabetes mellitus ([place])     Past Surgical History:  Procedure Laterality Date  .  APPENDECTOMY    . CHOLECYSTECTOMY    . COLONOSCOPY    . EYE SURGERY Bilateral    cataract surgery  . Hysterscopy    . KNEE ARTHROSCOPY     Left  . NECK SURGERY     x 2   . TONSILLECTOMY    . UPPER GASTROINTESTINAL ENDOSCOPY       Inpatient Medications: Scheduled Meds: . acetaminophen  650 mg Oral Once  . aspirin EC  81 mg Oral Daily  . ferrous sulfate  325 mg Oral Q breakfast  . gabapentin  300 mg Oral QHS  . heparin  5,000 Units Subcutaneous Q8H  . hydrochlorothiazide  12.5 mg Oral Daily  . insulin aspart  0-9 Units Subcutaneous TID WC  . pantoprazole  40 mg Oral Daily    PRN Meds: acetaminophen, albuterol, diazepam, hydrALAZINE, morphine injection, nitroGLYCERIN, ondansetron (ZOFRAN) IV  Allergies:    Allergies  Allergen Reactions  . Nsaids Other (See Comments)    Caused Ulcers.  . Cephalexin     unknown  . Darvocet [Propoxyphene N-Acetaminophen]     nausea  . Percocet [Oxycodone-Acetaminophen] Nausea Only  . Penicillins Rash and Other (See Comments)    Has patient had a PCN reaction causing immediate rash, facial/tongue/throat swelling, SOB or lightheadedness with hypotension: Yes Has patient had a PCN reaction causing severe rash involving mucus membranes or skin necrosis: No Has patient had a PCN reaction that required hospitalization  No Has patient had a PCN reaction occurring within the last 10 years: Yes If all of the above answers are "NO", then may proceed with Cephalosporin use.     Social History:   Social History   Socioeconomic History  . Marital status: Married    Spouse name: Not on file  . Number of children: Not on file  . Years of education: Not on file  . Highest education level: Not on file  Social Needs  . Financial resource strain: Not on file  . Food insecurity - worry: Not on file  . Food insecurity - inability: Not on file  . Transportation needs - medical: Not on file  . Transportation needs - non-medical: Not on file    Occupational History  . Not on file  Tobacco Use  . Smoking status: Never Smoker  . Smokeless tobacco: Never Used  Substance and Sexual Activity  . Alcohol use: No    Alcohol/week: 0.0 oz  . Drug use: No  . Sexual activity: Not Currently    Birth control/protection: Post-menopausal  Other Topics Concern  . Not on file  Social History Narrative  . Not on file    Family History:   The patient's family history includes Alzheimer's disease in her mother; Cancer in her maternal aunt and paternal grandmother; Diabetes in her maternal grandfather and maternal grandmother; Hypertension in her son; Kidney disease in her father, mother, and paternal uncle; Liver disease in her maternal uncle. There is no history of Colon cancer.  ROS:  Please see the history of present illness.  Chronic knee pain, had previous left knee surgery.  All other ROS reviewed and negative.     Physical Exam/Data:   Vitals:   11/02/17 0100 11/02/17 0159 11/02/17 0429 11/02/17 1122  BP: (!) 123/52 (!) 133/47 (!) 136/52   Pulse:  76 80   Resp: 19 19 18    Temp:  (!) 97.5 F (36.4 C) (!) 97.5 F (36.4 C)   TempSrc:  Oral Oral   SpO2:  99% 93% 94%  Weight:  187 lb 11.2 oz (85.1 kg) 187 lb 11.6 oz (85.2 kg)   Height:  5\' 2"  (1.575 m)     No intake or output data in the 24 hours ending 11/02/17 1200 Filed Weights   11/01/17 2247 11/02/17 0159 11/02/17 0429  Weight: 185 lb (83.9 kg) 187 lb 11.2 oz (85.1 kg) 187 lb 11.6 oz (85.2 kg)   Body mass index is 34.33 kg/m.   Gen: Overweight woman, appears comfortable at rest. HEENT: Conjunctiva and lids normal, oropharynx clear. Neck: Supple, no elevated JVP or carotid bruits, no thyromegaly. Lungs: Clear to auscultation, nonlabored breathing at rest. Cardiac: Regular rate and rhythm, no S3, 2/6 systolic murmur, no pericardial rub. Abdomen: Soft, nontender, bowel sounds present, no guarding or rebound. Extremities: No pitting edema, distal pulses 2+. Skin: Warm  and dry. Musculoskeletal: No kyphosis. Neuropsychiatric: Alert and oriented x3, affect grossly appropriate.  EKG:  I personally reviewed the tracing from 11/01/2017 which shows sinus rhythm with decreased R wave progression anteriorly.  Telemetry:  I personally reviewed telemetry which shows sinus rhythm.  Relevant CV Studies:  Echocardiogram 11/02/2017: Study Conclusions  - Left ventricle: The cavity size was normal. Wall thickness was   normal. Systolic function was vigorous. The estimated ejection   fraction was in the range of 65% to 70%. Wall motion was normal;   there were no regional wall motion abnormalities. Doppler   parameters are consistent  with abnormal left ventricular   relaxation (grade 1 diastolic dysfunction). - Aortic valve: Mildly calcified annulus. Trileaflet. - Mitral valve: There was mild regurgitation. - Right atrium: Central venous pressure (est): 3 mm Hg. - Atrial septum: No defect or patent foramen ovale was identified. - Tricuspid valve: There was mild regurgitation. - Pulmonary arteries: PA peak pressure: 28 mm Hg (S). - Pericardium, extracardiac: There was no pericardial effusion.  Impressions:  - Normal LV wall thickness with LVEF 60-65% and grade 1 diastolic   dysfunction. Mild mitral regurgitation and tricuspid   regurgitation. PASP estimated 28 mmHg.  Laboratory Data:  Chemistry Recent Labs  Lab 11/01/17 2249  NA 140  K 3.8  CL 103  CO2 27  GLUCOSE 113*  BUN 30*  CREATININE 0.98  CALCIUM 9.9  GFRNONAA 57*  GFRAA >60  ANIONGAP 10     Hematology Recent Labs  Lab 11/01/17 2249  WBC 9.7  RBC 4.68  HGB 14.4  HCT 43.1  MCV 92.1  MCH 30.8  MCHC 33.4  RDW 14.2  PLT 242   Cardiac Enzymes Recent Labs  Lab 11/02/17 0947  TROPONINI <0.03    Recent Labs  Lab 11/01/17 2253  TROPIPOC 0.00    Radiology/Studies:  Dg Chest 2 View  Result Date: 11/02/2017 CLINICAL DATA:  Chest pain EXAM: CHEST  2 VIEW COMPARISON:   02/23/2017 FINDINGS: Lower cervical hardware. No acute consolidation or effusion. Normal heart size. Moderate hiatal hernia. Stable left lower lung nodule. No pneumothorax. IMPRESSION: No active cardiopulmonary disease.  Moderate hiatal hernia Electronically Signed   By: Donavan Foil M.D.   On: 11/02/2017 00:11    Assessment and Plan:   1.  Recurrent chest discomfort over the last few days as discussed above, concerning for angina but also noted in the setting of a moderate to large hiatal hernia with recurrent reflux symptoms.  Cardiac risk factors include type 2 diabetes mellitus and hypertension.  Initial troponin I levels were negative and ECG is nonacute.  LVEF normal without wall motion and normalities by echocardiogram today.  2.  GERD with moderate to large hiatal hernia, follows with Dr. Laural Golden.  She has taken Prilosec as an outpatient.  3.  Essential hypertension, on HCTZ.  4.  Type 2 diabetes mellitus, on Glucophage.  Reviewed records and hospital course, discussed diagnostic options with the patient and her husband. Recommend cycling a full set of cardiac markers and if negative we will proceed with a Lexiscan Myoview tomorrow for ischemic evaluation.   Signed, Rozann Lesches, MD  11/02/2017 12:00 PM

## 2017-11-02 NOTE — Progress Notes (Signed)
PROGRESS NOTE  [name] NWG:[id number] DOB: Mar 21, 1946 DOA: 11/01/2017 PCP: Sharilyn Sites, MD  Brief History:  71 year old female with a history of diabetes mellitus, essential hypertension, peptic ulcer disease, and anxiety presented with 2-day history of chest discomfort.  The patient states that she was walking up at least 20 steps of stairs when she developed substernal chest discomfort.  The patient stated that she had to stop in the middle because of the chest discomfort shortness of breath with relief of her chest discomfort.  However over the next 24 hours, she continued to have intermittent chest discomfort even at rest.  As result, the patient presents to emergency department for further evaluation.  Over the past month, the patient has had intermittent chest discomfort at rest as well as with exertion.  She complains of chronic bilateral shoulder pain which she states occasionally is worsened with exertion.  She denies any dizziness, syncope, fevers, chills, coughing, hemoptysis.  Upon presentation, the patient was afebrile and hemodynamically stable saturating 99% on room air.  BMP and CBC were unremarkable.  EKG showed sinus rhythm with nonspecific T wave changes.  Chest x-ray was negative for acute changes.  Cardiology was consulted to assist with management.  Assessment/Plan: Chest pain -Patient has typical and atypical components -Consult cardiology -Echocardiogram -Cycle troponins -Continue aspirin  Diabetes mellitus type 2 -Hemoglobin A1c -Holding metformin -NovoLog sliding scale  Essential hypertension -Continue HCTZ  GERD -Continue Protonix  Anxiety -Continue home dose of Valium  Leg edema -venous duplex   Disposition Plan:   Home when cleared by cardiology Family Communication:   Spouse updated at bedside 11/8--Total time spent 31 minutes.  Greater than 50% spent face to face counseling and coordinating care. 0840 to 0911   Consultants:   cardiology  Code Status:  FULL  DVT Prophylaxis:   Heparin  Procedures: As Listed in Progress Note Above  Antibiotics: None    Subjective: Patient denies fevers, chills, headache, chest pain, dyspnea, nausea, vomiting, diarrhea, abdominal pain, dysuria, hematuria, hematochezia, and melena.   Objective: Vitals:   11/01/17 2330 11/02/17 0100 11/02/17 0159 11/02/17 0429  BP: (!) 154/71 (!) 123/52 (!) 133/47 (!) 136/52  Pulse: 75  76 80  Resp: 15 19 19 18   Temp:   (!) 97.5 F (36.4 C) (!) 97.5 F (36.4 C)  TempSrc:   Oral Oral  SpO2: 99%  99% 93%  Weight:   85.1 kg (187 lb 11.2 oz) 85.2 kg (187 lb 11.6 oz)  Height:   5\' 2"  (1.575 m)    No intake or output data in the 24 hours ending 11/02/17 0906 Weight change:  Exam:   General:  Pt is alert, follows commands appropriately, not in acute distress  HEENT: No icterus, No thrush, No neck mass, [place]/AT  Cardiovascular: RRR, S1/S2, no rubs, no gallops  Respiratory: CTA bilaterally, no wheezing, no crackles, no rhonchi  Abdomen: Soft/+BS, non tender, non distended, no guarding  Extremities: No edema, No lymphangitis, No petechiae, No rashes, no synovitis   Data Reviewed: I have personally reviewed following labs and imaging studies Basic Metabolic Panel: Recent Labs  Lab 11/01/17 2249  NA 140  K 3.8  CL 103  CO2 27  GLUCOSE 113*  BUN 30*  CREATININE 0.98  CALCIUM 9.9   Liver Function Tests: No results for input(s): AST, ALT, ALKPHOS, BILITOT, PROT, ALBUMIN in the last 168 hours. No results for input(s): LIPASE, AMYLASE in the last  168 hours. No results for input(s): AMMONIA in the last 168 hours. Coagulation Profile: No results for input(s): INR, PROTIME in the last 168 hours. CBC: Recent Labs  Lab 11/01/17 2249  WBC 9.7  HGB 14.4  HCT 43.1  MCV 92.1  PLT 242   Cardiac Enzymes: No results for input(s): CKTOTAL, CKMB, CKMBINDEX, TROPONINI in the last 168 hours. BNP: Invalid input(s):  POCBNP CBG: No results for input(s): GLUCAP in the last 168 hours. HbA1C: No results for input(s): HGBA1C in the last 72 hours. Urine analysis:    Component Value Date/Time   COLORURINE YELLOW 05/21/2013 2133   APPEARANCEUR CLEAR 05/21/2013 2133   LABSPEC >1.030 (H) 05/21/2013 2133   PHURINE 5.5 05/21/2013 2133   GLUCOSEU NEGATIVE 05/21/2013 2133   HGBUR TRACE (A) 05/21/2013 2133   BILIRUBINUR NEGATIVE 05/21/2013 2133   KETONESUR TRACE (A) 05/21/2013 2133   PROTEINUR NEGATIVE 05/21/2013 2133   UROBILINOGEN 0.2 05/21/2013 2133   NITRITE NEGATIVE 05/21/2013 2133   LEUKOCYTESUR NEGATIVE 05/21/2013 2133   Sepsis Labs: @LABRCNTIP (procalcitonin:4,lacticidven:4) )No results found for this or any previous visit (from the past 240 hour(s)).   Scheduled Meds: . acetaminophen  650 mg Oral Once  . ferrous sulfate  325 mg Oral Q breakfast  . gabapentin  300 mg Oral QHS  . heparin  5,000 Units Subcutaneous Q8H  . hydrochlorothiazide  12.5 mg Oral Daily  . pantoprazole  40 mg Oral Daily   Continuous Infusions:  Procedures/Studies: Dg Chest 2 View  Result Date: 11/02/2017 CLINICAL DATA:  Chest pain EXAM: CHEST  2 VIEW COMPARISON:  02/23/2017 FINDINGS: Lower cervical hardware. No acute consolidation or effusion. Normal heart size. Moderate hiatal hernia. Stable left lower lung nodule. No pneumothorax. IMPRESSION: No active cardiopulmonary disease.  Moderate hiatal hernia Electronically Signed   By: Donavan Foil M.D.   On: 11/02/2017 00:11    Josealberto Montalto, DO  [facility] Pager [phone]  If 7PM-7AM, please contact night-coverage [email or web address] Password [email or web address] 11/02/2017, 9:06 AM   LOS: 0 days

## 2017-11-03 ENCOUNTER — Observation Stay (HOSPITAL_BASED_OUTPATIENT_CLINIC_OR_DEPARTMENT_OTHER): Payer: Medicare Other

## 2017-11-03 ENCOUNTER — Encounter (HOSPITAL_COMMUNITY): Payer: Self-pay

## 2017-11-03 DIAGNOSIS — I10 Essential (primary) hypertension: Secondary | ICD-10-CM | POA: Diagnosis not present

## 2017-11-03 DIAGNOSIS — R072 Precordial pain: Secondary | ICD-10-CM

## 2017-11-03 DIAGNOSIS — I200 Unstable angina: Secondary | ICD-10-CM | POA: Diagnosis not present

## 2017-11-03 DIAGNOSIS — K449 Diaphragmatic hernia without obstruction or gangrene: Secondary | ICD-10-CM | POA: Diagnosis not present

## 2017-11-03 DIAGNOSIS — R600 Localized edema: Secondary | ICD-10-CM | POA: Diagnosis not present

## 2017-11-03 DIAGNOSIS — K219 Gastro-esophageal reflux disease without esophagitis: Secondary | ICD-10-CM | POA: Diagnosis not present

## 2017-11-03 DIAGNOSIS — R079 Chest pain, unspecified: Secondary | ICD-10-CM | POA: Diagnosis not present

## 2017-11-03 LAB — BASIC METABOLIC PANEL
ANION GAP: 8 (ref 5–15)
BUN: 20 mg/dL (ref 6–20)
CALCIUM: 9.6 mg/dL (ref 8.9–10.3)
CO2: 26 mmol/L (ref 22–32)
Chloride: 104 mmol/L (ref 101–111)
Creatinine, Ser: 0.83 mg/dL (ref 0.44–1.00)
GFR calc non Af Amer: >60 mL/min (ref >60)
Glucose, Bld: 120 mg/dL — ABNORMAL HIGH (ref 65–99)
POTASSIUM: 3.9 mmol/L (ref 3.5–5.1)
Sodium: 138 mmol/L (ref 135–145)

## 2017-11-03 LAB — NM MYOCAR MULTI W/SPECT W/WALL MOTION / EF
CHL CUP NUCLEAR SRS: 8
CHL CUP RESTING HR STRESS: 74 {beats}/min
LHR: 0.34
LV dias vol: 37 mL (ref 46–106)
LV sys vol: 9 mL
NUC STRESS TID: 0.73
Peak HR: 112 {beats}/min
SDS: 2
SSS: 10

## 2017-11-03 LAB — LIPID PANEL
CHOLESTEROL: 113 mg/dL (ref 0–200)
HDL: 59 mg/dL (ref >40)
LDL Cholesterol: 45 mg/dL (ref 0–99)
Total CHOL/HDL Ratio: 1.9 RATIO
Triglycerides: 45 mg/dL (ref <150)
VLDL: 9 mg/dL (ref 0–40)

## 2017-11-03 LAB — GLUCOSE, CAPILLARY
Glucose-Capillary: 115 mg/dL — ABNORMAL HIGH (ref 65–99)
Glucose-Capillary: 196 mg/dL — ABNORMAL HIGH (ref 65–99)

## 2017-11-03 MED ORDER — TECHNETIUM TC 99M TETROFOSMIN IV KIT
30.0000 | PACK | Freq: Once | INTRAVENOUS | Status: AC | PRN
  Administered 2017-11-03: 28.6 via INTRAVENOUS

## 2017-11-03 MED ORDER — SODIUM CHLORIDE 0.9% FLUSH
INTRAVENOUS | Status: AC
  Administered 2017-11-03: 10 mL via INTRAVENOUS
  Filled 2017-11-03: qty 10

## 2017-11-03 MED ORDER — REGADENOSON 0.4 MG/5ML IV SOLN
INTRAVENOUS | Status: AC
  Administered 2017-11-03: 0.4 mg via INTRAVENOUS
  Filled 2017-11-03: qty 5

## 2017-11-03 MED ORDER — TECHNETIUM TC 99M TETROFOSMIN IV KIT
10.0000 | PACK | Freq: Once | INTRAVENOUS | Status: AC | PRN
  Administered 2017-11-03: 8.6 via INTRAVENOUS

## 2017-11-03 NOTE — Progress Notes (Signed)
Patient discharged home with personal belongings, IV removed and site intact. Discharged with understanding of medications and paper work.

## 2017-11-03 NOTE — Progress Notes (Signed)
Stress test results are low risk for ischemia. Patient can go home from cardiology standpoint. I have spoken to the patient about this.  I have made a post hospital follow up appointment for her on November 30 th at 3 pm.

## 2017-11-03 NOTE — Progress Notes (Signed)
Progress Note  Patient Name: [name] Date of Encounter: 11/03/2017  Consulting Cardiologist: Satira Sark   Subjective   Patient assessed prior to [place] test in lab. No complaints of chest pain overnight.   Inpatient Medications    Scheduled Meds: . acetaminophen  650 mg Oral Once  . aspirin EC  81 mg Oral Daily  . ferrous sulfate  325 mg Oral Q breakfast  . gabapentin  300 mg Oral QHS  . heparin  5,000 Units Subcutaneous Q8H  . hydrochlorothiazide  12.5 mg Oral Daily  . insulin aspart  0-9 Units Subcutaneous TID WC  . pantoprazole  40 mg Oral Daily    PRN Meds: acetaminophen, albuterol, diazepam, hydrALAZINE, morphine injection, nitroGLYCERIN, ondansetron (ZOFRAN) IV, technetium tetrofosmin   Vital Signs    Vitals:   11/02/17 1122 11/02/17 1400 11/02/17 2236 11/03/17 0500  BP:  (!) 144/64 (!) 120/59 (!) 131/50  Pulse:  71 69 78  Resp:  18 18   Temp:  97.8 F (36.6 C) 98.4 F (36.9 C) 98.3 F (36.8 C)  TempSrc:   Oral Oral  SpO2: 94% 98% 95% 95%  Weight:      Height:        Intake/Output Summary (Last 24 hours) at 11/03/2017 0859 Last data filed at 11/02/2017 1300 Gross per 24 hour  Intake 480 ml  Output -  Net 480 ml   Filed Weights   11/01/17 2247 11/02/17 0159 11/02/17 0429  Weight: 185 lb (83.9 kg) 187 lb 11.2 oz (85.1 kg) 187 lb 11.6 oz (85.2 kg)    Telemetry    NSR - Personally Reviewed.  Physical Exam   GEN:  Overweight woman.  No acute distress.    Neck: No JVD Cardiac: RRR, 2/6 systolic murmur, no gallop.  Respiratory: Clear to auscultation bilaterally. GI: Soft, nontender, non-distended  MS: No edema; No deformity. Neuro:  Nonfocal  Psych: Normal affect   Labs    Chemistry Recent Labs  Lab 11/01/17 2249 11/03/17 0515  NA 140 138  K 3.8 3.9  CL 103 104  CO2 27 26  GLUCOSE 113* 120*  BUN 30* 20  CREATININE 0.98 0.83  CALCIUM 9.9 9.6  GFRNONAA 57* >60  GFRAA >60 >60  ANIONGAP 10 8      Hematology Recent Labs  Lab 11/01/17 2249  WBC 9.7  RBC 4.68  HGB 14.4  HCT 43.1  MCV 92.1  MCH 30.8  MCHC 33.4  RDW 14.2  PLT 242    Cardiac Enzymes Recent Labs  Lab 11/02/17 0947 11/02/17 1510  TROPONINI <0.03 <0.03    Recent Labs  Lab 11/01/17 2253  TROPIPOC 0.00     Radiology    Dg Chest 2 View  Result Date: 11/02/2017 CLINICAL DATA:  Chest pain EXAM: CHEST  2 VIEW COMPARISON:  02/23/2017 FINDINGS: Lower cervical hardware. No acute consolidation or effusion. Normal heart size. Moderate hiatal hernia. Stable left lower lung nodule. No pneumothorax. IMPRESSION: No active cardiopulmonary disease.  Moderate hiatal hernia Electronically Signed   By: Donavan Foil M.D.   On: 11/02/2017 00:11   US Venous Img Lower Bilateral  Result Date: 11/02/2017 CLINICAL DATA:  71 year old female with bilateral lower extremity edema EXAM: BILATERAL LOWER EXTREMITY VENOUS DOPPLER ULTRASOUND TECHNIQUE: Gray-scale sonography with graded compression, as well as color Doppler and duplex ultrasound were performed to evaluate the lower extremity deep venous systems from the level of the common femoral vein and including the common femoral, femoral,  profunda femoral, popliteal and calf veins including the posterior tibial, peroneal and gastrocnemius veins when visible. The superficial great saphenous vein was also interrogated. Spectral Doppler was utilized to evaluate flow at rest and with distal augmentation maneuvers in the common femoral, femoral and popliteal veins. COMPARISON:  None. FINDINGS: RIGHT LOWER EXTREMITY Common Femoral Vein: No evidence of thrombus. Normal compressibility, respiratory phasicity and response to augmentation. Saphenofemoral Junction: No evidence of thrombus. Normal compressibility and flow on color Doppler imaging. Profunda Femoral Vein: No evidence of thrombus. Normal compressibility and flow on color Doppler imaging. Femoral Vein: No evidence of thrombus. Normal  compressibility, respiratory phasicity and response to augmentation. Popliteal Vein: No evidence of thrombus. Normal compressibility, respiratory phasicity and response to augmentation. Calf Veins: No evidence of thrombus. Normal compressibility and flow on color Doppler imaging. Superficial Great Saphenous Vein: No evidence of thrombus. Normal compressibility. Venous Reflux:  None. Other Findings:  None. LEFT LOWER EXTREMITY Common Femoral Vein: No evidence of thrombus. Normal compressibility, respiratory phasicity and response to augmentation. Saphenofemoral Junction: No evidence of thrombus. Normal compressibility and flow on color Doppler imaging. Profunda Femoral Vein: No evidence of thrombus. Normal compressibility and flow on color Doppler imaging. Femoral Vein: No evidence of thrombus. Normal compressibility, respiratory phasicity and response to augmentation. Popliteal Vein: No evidence of thrombus. Normal compressibility, respiratory phasicity and response to augmentation. Calf Veins: No evidence of thrombus. Normal compressibility and flow on color Doppler imaging. Superficial Great Saphenous Vein: No evidence of thrombus. Normal compressibility. Venous Reflux:  None. Other Findings:  None. IMPRESSION: No evidence of deep venous thrombosis. Electronically Signed   By: Jacqulynn Cadet M.D.   On: 11/02/2017 13:20    Cardiac Studies   Echocardiogram 11/02/2017 Left ventricle: The cavity size was normal. Wall thickness was   normal. Systolic function was vigorous. The estimated ejection   fraction was in the range of 65% to 70%. Wall motion was normal;   there were no regional wall motion abnormalities. Doppler   parameters are consistent with abnormal left ventricular   relaxation (grade 1 diastolic dysfunction). - Aortic valve: Mildly calcified annulus. Trileaflet. - Mitral valve: There was mild regurgitation. - Right atrium: Central venous pressure (est): 3 mm Hg. - Atrial septum: No  defect or patent foramen ovale was identified. - Tricuspid valve: There was mild regurgitation. - Pulmonary arteries: PA peak pressure: 28 mm Hg (S). - Pericardium, extracardiac: There was no pericardial effusion.  Patient Profile     71 y.o. female with known history of large hiatal hernia, GERD, HTN,Type II diabetes, admitted with chest pain. Planned Lexiscan Myoview this am.  Assessment & Plan    1. Chest Pain: Troponin and EKG argue against ACS. Echo as above demonstrates norma.LVEF, without WMA. Stress portion of Lexiscan did not reveal any ischemic changes. Will await nuclear scintigraphy for definitive evaluation of ischemia.   2. Hypertension: BP is stable.   3. GERD: Continues on PPI.   4. Diabetes: Followed by PCP team.   Signed, Phill Myron. West Pugh, ANP, AACC   11/03/2017, 8:59 AM     Attending note:  Patient seen and examined.  Reviewed interval hospital course I discussed the case with Ms. West Pugh.  Ms. Beighley denies any further chest discomfort.  Troponin I levels were negative for ACS.  No arrhythmias by telemetry monitoring which I personally reviewed.  On examination this morning she appears comfortable, lungs are clear without labored breathing and cardiac exam reveals RRR with 2/6 systolic murmur.  Lab  work shows BUN 20, creatinine 0.83, potassium is 3.9, LDL 45.  Patient undergoing Lexiscan Myoview today, results pending.  Further recommendations to follow.  Satira Sark, M.D., F.A.C.C.

## 2017-11-03 NOTE — Discharge Summary (Signed)
Physician Discharge Summary  [name] JHE:[id number] DOB: Oct 28, 1946 DOA: 11/01/2017  PCP: Sharilyn Sites, MD  Admit date: 11/01/2017 Discharge date: 11/03/2017  Admitted From: Home Disposition:  Home  Recommendations for Outpatient Follow-up:  1. Follow up with PCP in 1-2 weeks 2. Please obtain BMP/CBC in one week   Discharge Condition: Stable CODE STATUS: FULL Diet recommendation: Heart Healthy / Carb Modified   Brief/Interim Summary: 71 year old female with a history of diabetes mellitus, essential hypertension, peptic ulcer disease, and anxiety presented with 2-day history of chest discomfort.  The patient states that she was walking up at least 20 steps of stairs when she developed substernal chest discomfort.  The patient stated that she had to stop in the middle because of the chest discomfort shortness of breath with relief of her chest discomfort.  However over the next 24 hours, she continued to have intermittent chest discomfort even at rest.  As result, the patient presents to emergency department for further evaluation.  Over the past month, the patient has had intermittent chest discomfort at rest as well as with exertion.  She complains of chronic bilateral shoulder pain which she states occasionally is worsened with exertion.  She denies any dizziness, syncope, fevers, chills, coughing, hemoptysis.  Upon presentation, the patient was afebrile and hemodynamically stable saturating 99% on room air.  BMP and CBC were unremarkable.  EKG showed sinus rhythm with nonspecific T wave changes.  Chest x-ray was negative for acute changes.  Cardiology was consulted to assist with management.    Discharge Diagnoses:  Chest pain -Patient has typical and atypical components -Consult cardiology-->NM stress test-->low risk, EF 76% -Echocardiogram--EF 65-60%, no WMA -Cycle troponins--neg x 3 -Continue aspirin  Diabetes mellitus type 2 -Hemoglobin A1c--6.1 -Holding  metformin--restart after d/c -NovoLog sliding scale  Essential hypertension -Continue HCTZ  GERD -Continue Protonix  Anxiety -Continue home dose of Valium  Leg edema -venous duplex--neg     Discharge Instructions  Discharge Instructions    Diet - low sodium heart healthy   Complete by:  As directed    Increase activity slowly   Complete by:  As directed      Allergies as of 11/03/2017      Reactions   Nsaids Other (See Comments)   Caused Ulcers.   Cephalexin    unknown   Darvocet [propoxyphene N-acetaminophen]    nausea   Percocet [oxycodone-acetaminophen] Nausea Only   Penicillins Rash, Other (See Comments)   Has patient had a PCN reaction causing immediate rash, facial/tongue/throat swelling, SOB or lightheadedness with hypotension: Yes Has patient had a PCN reaction causing severe rash involving mucus membranes or skin necrosis: No Has patient had a PCN reaction that required hospitalization No Has patient had a PCN reaction occurring within the last 10 years: Yes If all of the above answers are "NO", then may proceed with Cephalosporin use.      Medication List    TAKE these medications   aspirin EC 81 MG tablet Take 81 mg by mouth every morning.   Biotin 1 MG Caps Take 1 mg daily by mouth.   diazepam 5 MG tablet Commonly known as:  VALIUM Take 5 mg by mouth every 8 (eight) hours as needed for anxiety.   ferrous sulfate 325 (65 FE) MG tablet Take 1 tablet (325 mg total) by mouth daily with breakfast.   gabapentin 300 MG capsule Commonly known as:  NEURONTIN Take 300 mg by mouth at bedtime.   hydrochlorothiazide 25 MG tablet Commonly  known as:  HYDRODIURIL Take 12.5 mg by mouth daily.   metFORMIN 500 MG tablet Commonly known as:  GLUCOPHAGE Take 500 mg by mouth 2 (two) times daily with a meal.   montelukast 10 MG tablet Commonly known as:  SINGULAIR Take 10 mg by mouth every morning.   omeprazole 20 MG capsule Commonly known as:   PRILOSEC TAKE 1 CAPSULE BY MOUTH TWICE DAILY BEFORE A MEAL   potassium chloride 10 MEQ tablet Commonly known as:  K-DUR TAKE 1 TABLET(10 MEQ) BY MOUTH DAILY   TURMERIC PO Take 1 tablet by mouth daily.   Vitamin D 2000 units Caps Take 1 capsule by mouth daily.      Follow-up Information    Lendon Colonel, NP Follow up on 11/24/2017.   Specialties:  Nurse Practitioner, Radiology, Cardiology Why:  3 pm, Please bring all medications with you to appointment  Contact information: [place] [address] [phone]          Allergies  Allergen Reactions  . Nsaids Other (See Comments)    Caused Ulcers.  . Cephalexin     unknown  . Darvocet [Propoxyphene N-Acetaminophen]     nausea  . Percocet [Oxycodone-Acetaminophen] Nausea Only  . Penicillins Rash and Other (See Comments)    Has patient had a PCN reaction causing immediate rash, facial/tongue/throat swelling, SOB or lightheadedness with hypotension: Yes Has patient had a PCN reaction causing severe rash involving mucus membranes or skin necrosis: No Has patient had a PCN reaction that required hospitalization No Has patient had a PCN reaction occurring within the last 10 years: Yes If all of the above answers are "NO", then may proceed with Cephalosporin use.     Consultations:  cardiology   Procedures/Studies: Dg Chest 2 View  Result Date: 11/02/2017 CLINICAL DATA:  Chest pain EXAM: CHEST  2 VIEW COMPARISON:  02/23/2017 FINDINGS: Lower cervical hardware. No acute consolidation or effusion. Normal heart size. Moderate hiatal hernia. Stable left lower lung nodule. No pneumothorax. IMPRESSION: No active cardiopulmonary disease.  Moderate hiatal hernia Electronically Signed   By: Donavan Foil M.D.   On: 11/02/2017 00:11   Nm Myocar Multi W/spect W/wall Motion / Ef  Result Date: 11/03/2017  No diagnostic ST segment changes to indicate ischemia.  Small, mild intensity, partially reversible mid  anteroseptal defect that is most consistent with variable breast tissue attenuation.  Small, mild intensity, fixed apical inferolateral defect that is most consistent with soft tissue attenuation.  This is a low risk study.  Nuclear stress EF: 76%.    US Venous Img Lower Bilateral  Result Date: 11/02/2017 CLINICAL DATA:  71 year old female with bilateral lower extremity edema EXAM: BILATERAL LOWER EXTREMITY VENOUS DOPPLER ULTRASOUND TECHNIQUE: Gray-scale sonography with graded compression, as well as color Doppler and duplex ultrasound were performed to evaluate the lower extremity deep venous systems from the level of the common femoral vein and including the common femoral, femoral, profunda femoral, popliteal and calf veins including the posterior tibial, peroneal and gastrocnemius veins when visible. The superficial great saphenous vein was also interrogated. Spectral Doppler was utilized to evaluate flow at rest and with distal augmentation maneuvers in the common femoral, femoral and popliteal veins. COMPARISON:  None. FINDINGS: RIGHT LOWER EXTREMITY Common Femoral Vein: No evidence of thrombus. Normal compressibility, respiratory phasicity and response to augmentation. Saphenofemoral Junction: No evidence of thrombus. Normal compressibility and flow on color Doppler imaging. Profunda Femoral Vein: No evidence of thrombus. Normal compressibility and flow on  color Doppler imaging. Femoral Vein: No evidence of thrombus. Normal compressibility, respiratory phasicity and response to augmentation. Popliteal Vein: No evidence of thrombus. Normal compressibility, respiratory phasicity and response to augmentation. Calf Veins: No evidence of thrombus. Normal compressibility and flow on color Doppler imaging. Superficial Great Saphenous Vein: No evidence of thrombus. Normal compressibility. Venous Reflux:  None. Other Findings:  None. LEFT LOWER EXTREMITY Common Femoral Vein: No evidence of thrombus. Normal  compressibility, respiratory phasicity and response to augmentation. Saphenofemoral Junction: No evidence of thrombus. Normal compressibility and flow on color Doppler imaging. Profunda Femoral Vein: No evidence of thrombus. Normal compressibility and flow on color Doppler imaging. Femoral Vein: No evidence of thrombus. Normal compressibility, respiratory phasicity and response to augmentation. Popliteal Vein: No evidence of thrombus. Normal compressibility, respiratory phasicity and response to augmentation. Calf Veins: No evidence of thrombus. Normal compressibility and flow on color Doppler imaging. Superficial Great Saphenous Vein: No evidence of thrombus. Normal compressibility. Venous Reflux:  None. Other Findings:  None. IMPRESSION: No evidence of deep venous thrombosis. Electronically Signed   By: Jacqulynn Cadet M.D.   On: 11/02/2017 13:20         Discharge Exam: Vitals:   11/02/17 2236 11/03/17 0500  BP: (!) 120/59 (!) 131/50  Pulse: 69 78  Resp: 18   Temp: 98.4 F (36.9 C) 98.3 F (36.8 C)  SpO2: 95% 95%   Vitals:   11/02/17 1122 11/02/17 1400 11/02/17 2236 11/03/17 0500  BP:  (!) 144/64 (!) 120/59 (!) 131/50  Pulse:  71 69 78  Resp:  18 18   Temp:  97.8 F (36.6 C) 98.4 F (36.9 C) 98.3 F (36.8 C)  TempSrc:   Oral Oral  SpO2: 94% 98% 95% 95%  Weight:      Height:        General: Pt is alert, awake, not in acute distress Cardiovascular: RRR, S1/S2 +, no rubs, no gallops Respiratory: CTA bilaterally, no wheezing, no rhonchi Abdominal: Soft, NT, ND, bowel sounds + Extremities: no edema, no cyanosis   The results of significant diagnostics from this hospitalization (including imaging, microbiology, ancillary and laboratory) are listed below for reference.    Significant Diagnostic Studies: Dg Chest 2 View  Result Date: 11/02/2017 CLINICAL DATA:  Chest pain EXAM: CHEST  2 VIEW COMPARISON:  02/23/2017 FINDINGS: Lower cervical hardware. No acute consolidation or  effusion. Normal heart size. Moderate hiatal hernia. Stable left lower lung nodule. No pneumothorax. IMPRESSION: No active cardiopulmonary disease.  Moderate hiatal hernia Electronically Signed   By: Donavan Foil M.D.   On: 11/02/2017 00:11   Nm Myocar Multi W/spect W/wall Motion / Ef  Result Date: 11/03/2017  No diagnostic ST segment changes to indicate ischemia.  Small, mild intensity, partially reversible mid anteroseptal defect that is most consistent with variable breast tissue attenuation.  Small, mild intensity, fixed apical inferolateral defect that is most consistent with soft tissue attenuation.  This is a low risk study.  Nuclear stress EF: 76%.    US Venous Img Lower Bilateral  Result Date: 11/02/2017 CLINICAL DATA:  71 year old female with bilateral lower extremity edema EXAM: BILATERAL LOWER EXTREMITY VENOUS DOPPLER ULTRASOUND TECHNIQUE: Gray-scale sonography with graded compression, as well as color Doppler and duplex ultrasound were performed to evaluate the lower extremity deep venous systems from the level of the common femoral vein and including the common femoral, femoral, profunda femoral, popliteal and calf veins including the posterior tibial, peroneal and gastrocnemius veins when visible. The superficial great saphenous vein  was also interrogated. Spectral Doppler was utilized to evaluate flow at rest and with distal augmentation maneuvers in the common femoral, femoral and popliteal veins. COMPARISON:  None. FINDINGS: RIGHT LOWER EXTREMITY Common Femoral Vein: No evidence of thrombus. Normal compressibility, respiratory phasicity and response to augmentation. Saphenofemoral Junction: No evidence of thrombus. Normal compressibility and flow on color Doppler imaging. Profunda Femoral Vein: No evidence of thrombus. Normal compressibility and flow on color Doppler imaging. Femoral Vein: No evidence of thrombus. Normal compressibility, respiratory phasicity and response to  augmentation. Popliteal Vein: No evidence of thrombus. Normal compressibility, respiratory phasicity and response to augmentation. Calf Veins: No evidence of thrombus. Normal compressibility and flow on color Doppler imaging. Superficial Great Saphenous Vein: No evidence of thrombus. Normal compressibility. Venous Reflux:  None. Other Findings:  None. LEFT LOWER EXTREMITY Common Femoral Vein: No evidence of thrombus. Normal compressibility, respiratory phasicity and response to augmentation. Saphenofemoral Junction: No evidence of thrombus. Normal compressibility and flow on color Doppler imaging. Profunda Femoral Vein: No evidence of thrombus. Normal compressibility and flow on color Doppler imaging. Femoral Vein: No evidence of thrombus. Normal compressibility, respiratory phasicity and response to augmentation. Popliteal Vein: No evidence of thrombus. Normal compressibility, respiratory phasicity and response to augmentation. Calf Veins: No evidence of thrombus. Normal compressibility and flow on color Doppler imaging. Superficial Great Saphenous Vein: No evidence of thrombus. Normal compressibility. Venous Reflux:  None. Other Findings:  None. IMPRESSION: No evidence of deep venous thrombosis. Electronically Signed   By: Jacqulynn Cadet M.D.   On: 11/02/2017 13:20     Microbiology: No results found for this or any previous visit (from the past 240 hour(s)).   Labs: Basic Metabolic Panel: Recent Labs  Lab 11/01/17 2249 11/03/17 0515  NA 140 138  K 3.8 3.9  CL 103 104  CO2 27 26  GLUCOSE 113* 120*  BUN 30* 20  CREATININE 0.98 0.83  CALCIUM 9.9 9.6   Liver Function Tests: No results for input(s): AST, ALT, ALKPHOS, BILITOT, PROT, ALBUMIN in the last 168 hours. No results for input(s): LIPASE, AMYLASE in the last 168 hours. No results for input(s): AMMONIA in the last 168 hours. CBC: Recent Labs  Lab 11/01/17 2249  WBC 9.7  HGB 14.4  HCT 43.1  MCV 92.1  PLT 242   Cardiac  Enzymes: Recent Labs  Lab 11/02/17 0947 11/02/17 1510  TROPONINI <0.03 <0.03   BNP: Invalid input(s): POCBNP CBG: Recent Labs  Lab 11/02/17 1203 11/02/17 1652 11/02/17 2054 11/03/17 0733 11/03/17 1148  GLUCAP 119* 101* 123* 115* 196*    Time coordinating discharge:  Greater than 30 minutes  Signed:  Charmine Bockrath, DO [facility] Pager: [phone] 11/03/2017, 1:49 PM

## 2017-11-12 IMAGING — US US EXTREM LOW VENOUS BILAT
1 series · 13 of 24 positions shown · non-contrast
Comparison: None.

CLINICAL DATA: 71-year-old female with bilateral lower extremity
edema



[Series 1: us extrem low venous bilat · 0.07mm/px · 13 of 65 slices shown]
[im 1/65]
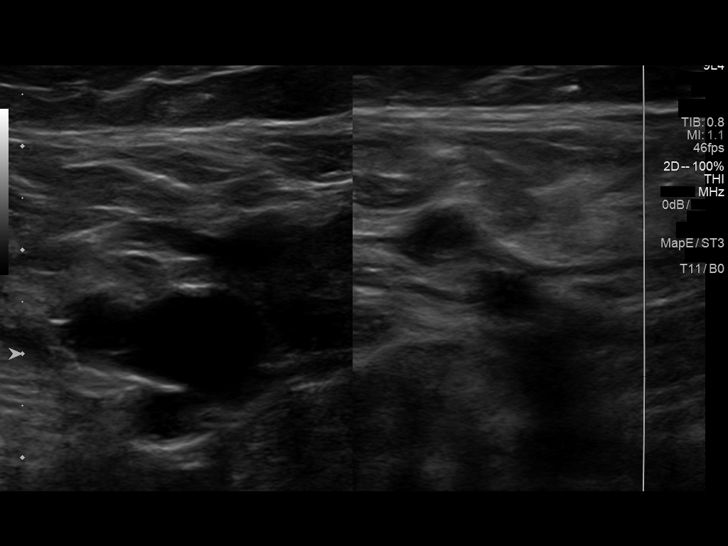
[im 6/65]
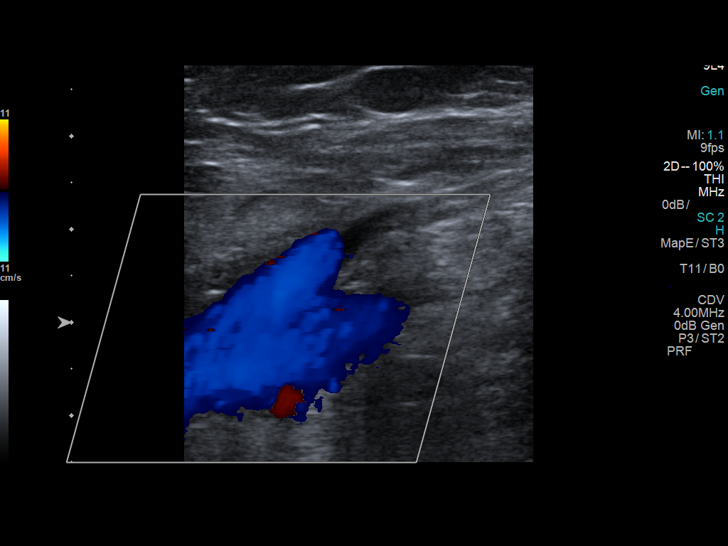
[im 12/65]
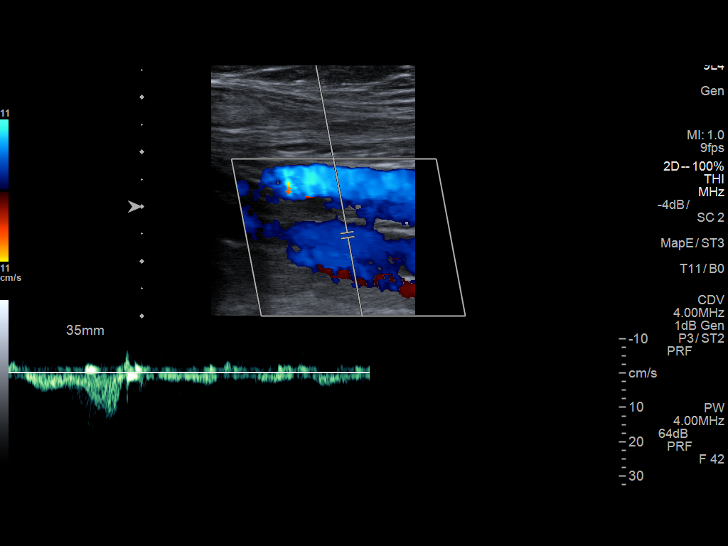
[im 17/65]
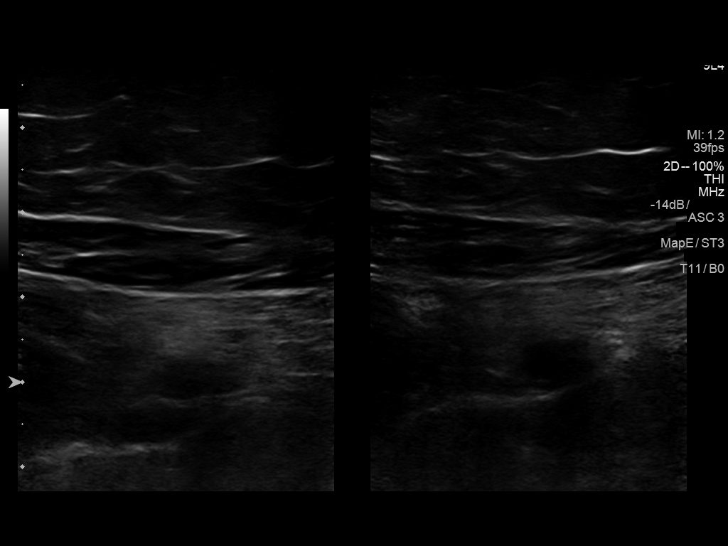
[im 23/65]
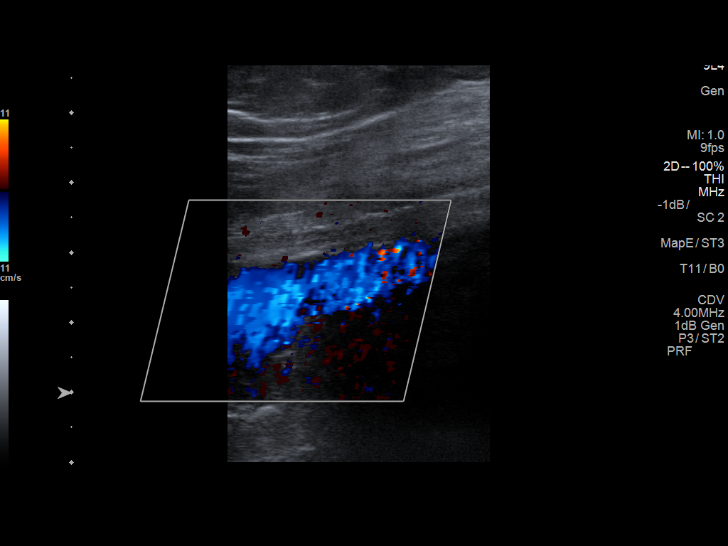
[im 28/65]
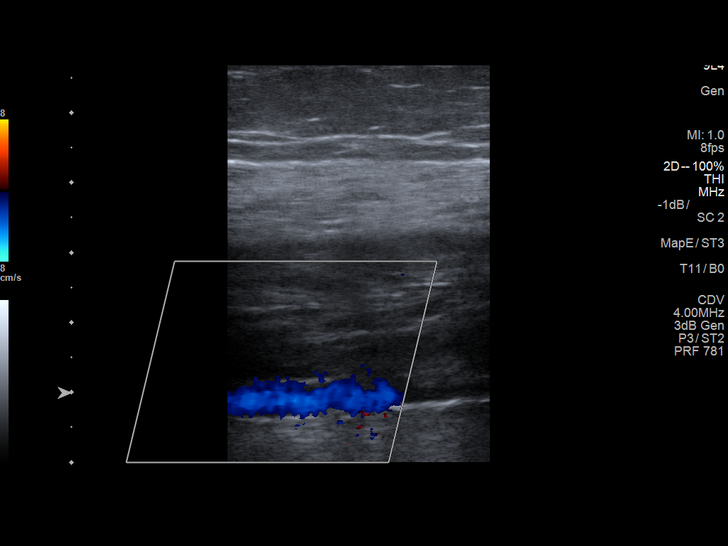
[im 34/65]
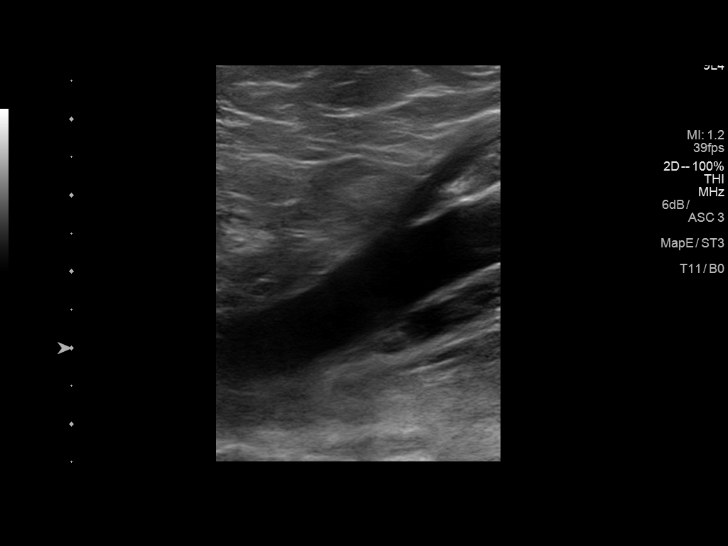
[im 37/65]
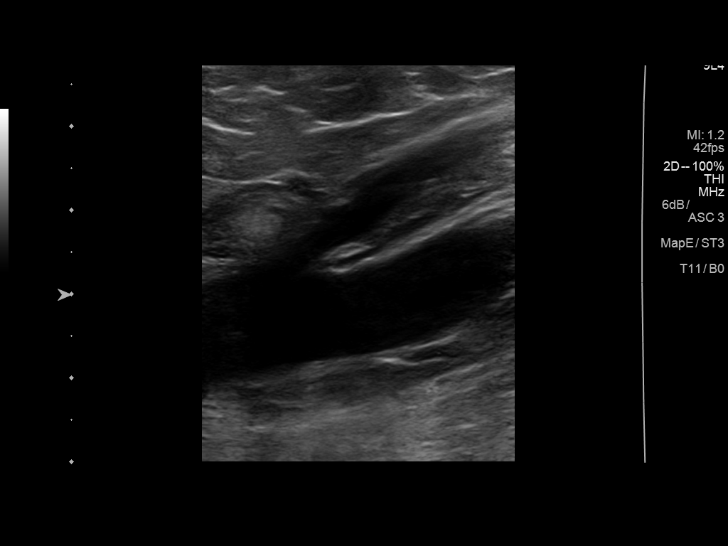
[im 42/65]
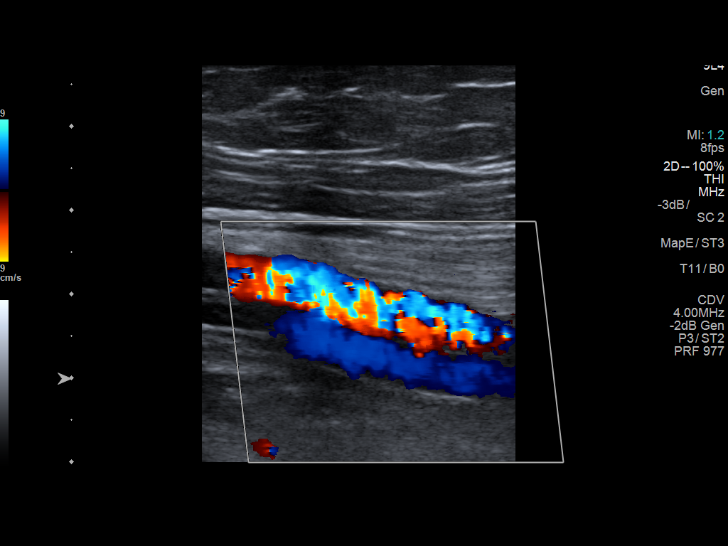
[im 48/65]
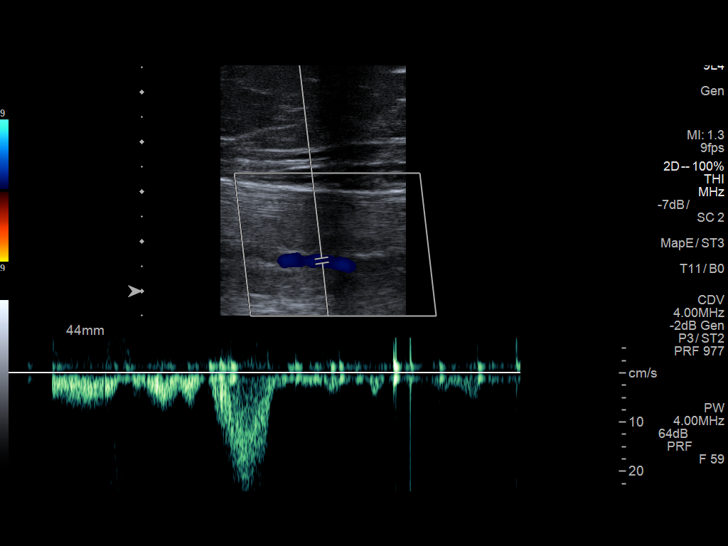
[im 53/65]
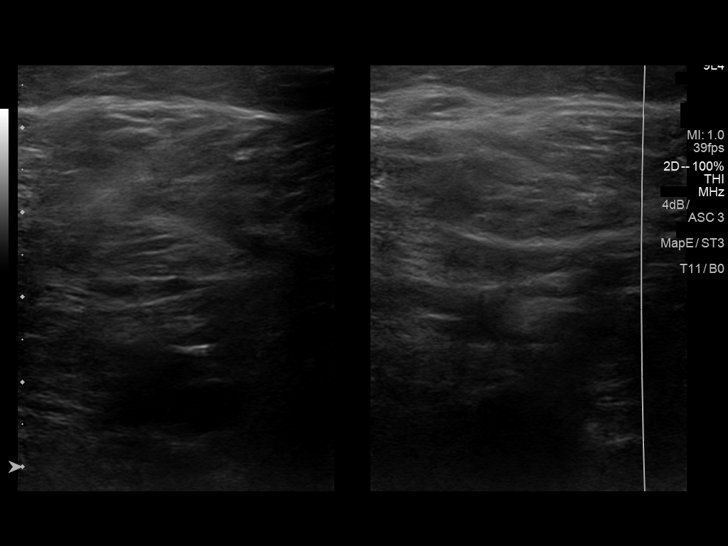
[im 59/65]
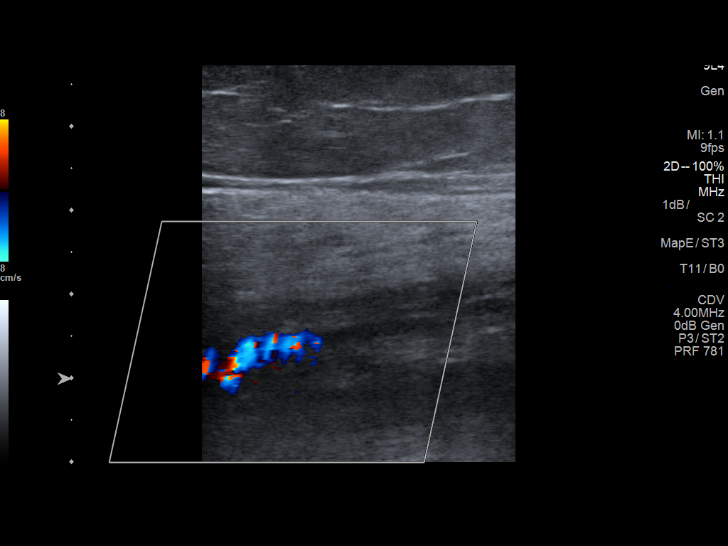
[im 65/65]
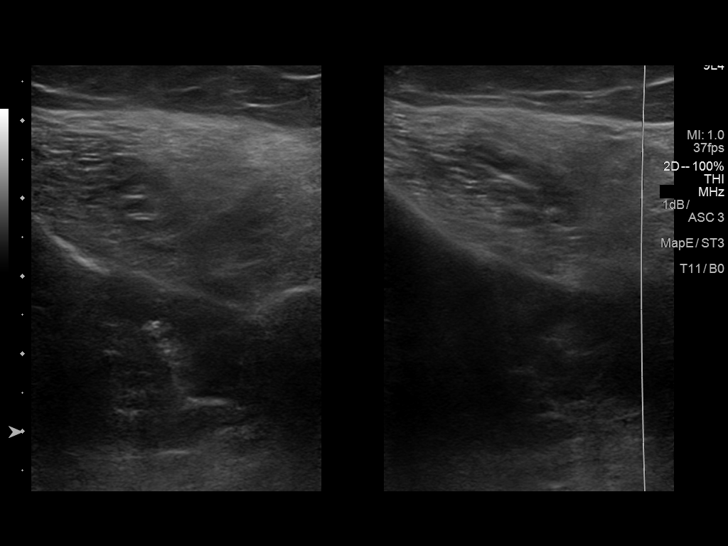

[13 of 24 positions shown; findings below may reference images not displayed]

FINDINGS: RIGHT LOWER EXTREMITY

Common Femoral Vein: No evidence of thrombus. Normal
compressibility, respiratory phasicity and response to augmentation.

Saphenofemoral Junction: No evidence of thrombus. Normal
compressibility and flow on color Doppler imaging.

Profunda Femoral Vein: No evidence of thrombus. Normal
compressibility and flow on color Doppler imaging.

Femoral Vein: No evidence of thrombus. Normal compressibility,
respiratory phasicity and response to augmentation.

Popliteal Vein: No evidence of thrombus. Normal compressibility,
respiratory phasicity and response to augmentation.

Calf Veins: No evidence of thrombus. Normal compressibility and flow
on color Doppler imaging.

Superficial Great Saphenous Vein: No evidence of thrombus. Normal
compressibility.

Venous Reflux:  None.

Other Findings:  None.

LEFT LOWER EXTREMITY

Common Femoral Vein: No evidence of thrombus. Normal
compressibility, respiratory phasicity and response to augmentation.

Saphenofemoral Junction: No evidence of thrombus. Normal
compressibility and flow on color Doppler imaging.

Profunda Femoral Vein: No evidence of thrombus. Normal
compressibility and flow on color Doppler imaging.

Femoral Vein: No evidence of thrombus. Normal compressibility,
respiratory phasicity and response to augmentation.

Popliteal Vein: No evidence of thrombus. Normal compressibility,
respiratory phasicity and response to augmentation.

Calf Veins: No evidence of thrombus. Normal compressibility and flow
on color Doppler imaging.

Superficial Great Saphenous Vein: No evidence of thrombus. Normal
compressibility.

Venous Reflux:  None.

Other Findings:  None.
IMPRESSION: No evidence of deep venous thrombosis.

## 2017-11-15 DIAGNOSIS — J019 Acute sinusitis, unspecified: Secondary | ICD-10-CM | POA: Diagnosis not present

## 2017-11-15 DIAGNOSIS — R05 Cough: Secondary | ICD-10-CM | POA: Diagnosis not present

## 2017-11-15 DIAGNOSIS — Z6834 Body mass index (BMI) 34.0-34.9, adult: Secondary | ICD-10-CM | POA: Diagnosis not present

## 2017-11-15 DIAGNOSIS — J029 Acute pharyngitis, unspecified: Secondary | ICD-10-CM | POA: Diagnosis not present

## 2017-11-20 DIAGNOSIS — D509 Iron deficiency anemia, unspecified: Secondary | ICD-10-CM | POA: Diagnosis not present

## 2017-11-20 DIAGNOSIS — Z1389 Encounter for screening for other disorder: Secondary | ICD-10-CM | POA: Diagnosis not present

## 2017-11-20 DIAGNOSIS — K449 Diaphragmatic hernia without obstruction or gangrene: Secondary | ICD-10-CM | POA: Diagnosis not present

## 2017-11-20 DIAGNOSIS — I10 Essential (primary) hypertension: Secondary | ICD-10-CM | POA: Diagnosis not present

## 2017-11-20 DIAGNOSIS — Z6834 Body mass index (BMI) 34.0-34.9, adult: Secondary | ICD-10-CM | POA: Diagnosis not present

## 2017-11-20 DIAGNOSIS — E119 Type 2 diabetes mellitus without complications: Secondary | ICD-10-CM | POA: Diagnosis not present

## 2017-11-22 ENCOUNTER — Other Ambulatory Visit (HOSPITAL_COMMUNITY)
Admission: RE | Admit: 2017-11-22 | Discharge: 2017-11-22 | Disposition: A | Discharge status: Home / Self Care | Payer: Medicare Other | Source: Ambulatory Visit | Attending: Obstetrics and Gynecology | Admitting: Obstetrics and Gynecology

## 2017-11-22 ENCOUNTER — Ambulatory Visit (INDEPENDENT_AMBULATORY_CARE_PROVIDER_SITE_OTHER): Payer: Medicare Other | Admitting: Obstetrics and Gynecology

## 2017-11-22 ENCOUNTER — Encounter: Payer: Self-pay | Admitting: Obstetrics and Gynecology

## 2017-11-22 ENCOUNTER — Other Ambulatory Visit: Payer: Self-pay

## 2017-11-22 VITALS — BP 120/72 | HR 79 | Ht 63.5 in | Wt 184.0 lb

## 2017-11-22 DIAGNOSIS — Z124 Encounter for screening for malignant neoplasm of cervix: Secondary | ICD-10-CM | POA: Insufficient documentation

## 2017-11-22 DIAGNOSIS — Z01419 Encounter for gynecological examination (general) (routine) without abnormal findings: Secondary | ICD-10-CM

## 2017-11-22 NOTE — Progress Notes (Signed)
Patient ID: [name], female   DOB: 1946/02/06, 71 y.o.   MRN: [id number]  Assessment:  1. Annual Gyn Exam Plan:  1. pap smear done, next pap due in 3 years 2. return prn or in 3 years 3. Annual mammogram advised after age 37, 03/28/2017 Subjective:  [name] is a 71 y.o. female who presents for annual exam. Her last pap smear was done on 05/14/2017. No LMP recorded. Patient is postmenopausal.  The patient has no complaints today.   The following portions of the patient's history were reviewed and updated as appropriate: allergies, current medications, past family history, past medical history, past social history, past surgical history and problem list. Past Medical History:  Diagnosis Date  . Arthritis   . Asthma   . Carpal tunnel syndrome   . Chronic anxiety   . Gastric ulcer   . GERD (gastroesophageal reflux disease)   . H/O hiatal hernia   . History of heart murmur in childhood   . Hypertension   . Iron deficiency anemia   . 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 in left ear.  Left  side of body weaker than right.  Carpal tunnel bil .  Marland Kitchen Type 2 diabetes mellitus ([place])     Past Surgical History:  Procedure Laterality Date  . APPENDECTOMY    . CHOLECYSTECTOMY    . COLONOSCOPY    . COLONOSCOPY N/A 09/26/2013   Procedure: COLONOSCOPY;  Surgeon: Rogene Houston, MD;  [facility];  Service: Endoscopy;  Laterality: N/A;  1030-rescheduled to 12:00pm Ann notified pt  . COLONOSCOPY N/A 03/27/2015   Procedure: COLONOSCOPY;  Surgeon: Rogene Houston, MD;  [facility];  Service: Endoscopy;  Laterality: N/A;  11:15 - moved to 8:25 - Ann to notify pt  . ESOPHAGOGASTRODUODENOSCOPY  12/08/2011   Procedure: ESOPHAGOGASTRODUODENOSCOPY (EGD);  Surgeon: Rogene Houston, MD;  [facility];  Service: Endoscopy;  Laterality: N/A;  3:00   . ESOPHAGOGASTRODUODENOSCOPY N/A 03/27/2015   Procedure:  ESOPHAGOGASTRODUODENOSCOPY (EGD);  Surgeon: Rogene Houston, MD;  [facility];  Service: Endoscopy;  Laterality: N/A;  . EYE SURGERY Bilateral    cataract surgery  . GIVENS CAPSULE STUDY N/A 11/04/2015   Procedure: GIVENS CAPSULE STUDY;  Surgeon: Rogene Houston, MD;  [facility];  Service: Endoscopy;  Laterality: N/A;  . Hysterscopy    . KNEE ARTHROSCOPY     Left  . NECK SURGERY     x 2   . TONSILLECTOMY    . TOTAL KNEE ARTHROPLASTY  07/11/2012   Procedure: TOTAL KNEE ARTHROPLASTY;  Surgeon: Ninetta Lights, MD;  Location: [place];  Service: Orthopedics;  Laterality: Left;  left total knee arthroplasty  . UPPER GASTROINTESTINAL ENDOSCOPY    . YAG LASER APPLICATION Right 0/38/8828   Procedure: YAG LASER APPLICATION;  Surgeon: Rutherford Guys, MD;  Location: [facility];  Service: Ophthalmology;  Laterality: Right;     Current Outpatient Medications:  .  aspirin EC 81 MG tablet, Take 81 mg by mouth every morning., Disp: , Rfl:  .  Biotin 1 MG CAPS, Take 1 mg daily by mouth. , Disp: , Rfl:  .  Cholecalciferol (VITAMIN D) 2000 UNITS CAPS, Take 1 capsule by mouth daily.  , Disp: , Rfl:  .  diazepam (VALIUM) 5 MG tablet, Take 5 mg by mouth every 8 (eight) hours as needed for anxiety. , Disp: , Rfl:  .  ferrous sulfate 325 (65 FE) MG tablet, Take 1 tablet (325 mg total) by mouth daily with breakfast., Disp: , Rfl:  .  gabapentin (NEURONTIN) 300 MG capsule, Take 300 mg by mouth at bedtime. , Disp: , Rfl:  .  hydrochlorothiazide (HYDRODIURIL) 25 MG tablet, Take 12.5 mg by mouth daily., Disp: , Rfl:  .  metFORMIN (GLUCOPHAGE) 500 MG tablet, Take 500 mg by mouth 2 (two) times daily with a meal. , Disp: , Rfl:  .  montelukast (SINGULAIR) 10 MG tablet, Take 10 mg by mouth every morning. , Disp: , Rfl:  .  omeprazole (PRILOSEC) 20 MG capsule, TAKE 1 CAPSULE BY MOUTH TWICE DAILY BEFORE A MEAL, Disp: 60 capsule, Rfl: 5 .  potassium chloride (K-DUR) 10 MEQ tablet, TAKE 1 TABLET(10 MEQ) BY  MOUTH DAILY, Disp: 30 tablet, Rfl: 5 .  TURMERIC PO, Take 1 tablet by mouth daily., Disp: , Rfl:   Review of Systems Constitutional: negative Gastrointestinal: negative Genitourinary: negative  Objective:  BP 120/72 (BP Location: Right Arm, Patient Position: Sitting, Cuff Size: Normal)   Pulse 79   Ht 5' 3.5" (1.613 m)   Wt 184 lb (83.5 kg)   BMI 32.08 kg/m    BMI: Body mass index is 32.08 kg/m.   General Appearance: Alert, appropriate appearance for age. No acute distress HEENT: Grossly normal Neck / Thyroid:  Cardiovascular: RRR; normal S1, S2, no murmur Lungs: CTA bilaterally Back: No CVAT Breast Exam: No dimpling, nipple retraction or discharge. No masses or nodes., Normal to inspection, Normal breast tissue bilaterally and No masses or nodes.No dimpling, nipple retraction or discharge. Gastrointestinal: Soft, non-tender, no masses or organomegaly Pelvic Exam:  External genitalia: normal Urinary system: well behaved Cervix: tiny, well supported Uterus: tiny, well supported Rectovaginal: hemoccult negative Lymphatic Exam: Non-palpable nodes in neck, clavicular, axillary, or inguinal regions  Skin: multiple aken cytosis Neurologic: Normal gait and speech, no tremor  Psychiatric: Alert and oriented, appropriate affect.  Urinalysis:Not done  Mallory Shirk. MD Pgr [phone] 11:13 AM   By signing my name below, I, Margit Banda, attest that this documentation has been prepared under the direction and in the presence of Jonnie Kind, MD. Electronically Signed: Margit Banda, Medical Scribe. 11/22/17. 11:13 AM.  I personally performed the services described in this documentation, which was SCRIBED in my presence. The recorded information has been reviewed and considered accurate. It has been edited as necessary during review. Jonnie Kind, MD

## 2017-11-23 LAB — CYTOLOGY - PAP
DIAGNOSIS: NEGATIVE
HPV: NOT DETECTED

## 2017-11-23 NOTE — Progress Notes (Signed)
Cardiology Office Note   Date:  11/24/2017   ID:  Hailey, [name] 06/07/1946, MRN [id number]  PCP:  Sharilyn Sites, MD  Cardiologist: Dr. Satira Sark  Chief Complaint  Patient presents with  . Chest Pain  . Hypertension     History of Present Illness: [name] is a 71 y.o. female who presents for ongoing assessment and management of chest discomfort, with recent hospitalization in November 2018 for same.  Other history includes hypertension, type 2 diabetes, GERD, and a moderate to large hiatal hernia.  During hospitalization, the patient was recommended to proceed with Lexiscan Myoview to rule out ischemic etiology for chest discomfort.   No diagnostic ST segment changes to indicate ischemia.  Small, mild intensity, partially reversible mid anteroseptal defect that is most consistent with variable breast tissue attenuation.  Small, mild intensity, fixed apical inferolateral defect that is most consistent with soft tissue attenuation.  This is a low risk study.  Nuclear stress EF: 76%.  The patient was released on 11/03/2017.  He was continued on medication regimen for blood pressure control which includes HCTZ.  She has been ruled out for ACS and is here for hospital follow-up.  The patient continues to improve symptomatically.  She no longer has complaints of upper chest and shoulder pain.  She is not yet had surgical intervention of her large hiatal hernia.  She states that her primary care physician did not feel that this was necessary until she became more symptomatic.  She is trying hard to continue her diet change, reducing acid in her diet, and takes an H2 blocker or Tums as needed.  She also continues on PPI.  Past Medical History:  Diagnosis Date  . Arthritis   . Asthma   . Carpal tunnel syndrome   . Chronic anxiety   . Gastric ulcer   . GERD (gastroesophageal reflux disease)   . H/O hiatal hernia   . History of heart murmur in childhood   .  Hypertension   . Iron deficiency anemia   . 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 in left ear.  Left  side of body weaker than right.  Carpal tunnel bil .  Marland Kitchen Type 2 diabetes mellitus ([place])     Past Surgical History:  Procedure Laterality Date  . APPENDECTOMY    . CHOLECYSTECTOMY    . COLONOSCOPY    . COLONOSCOPY N/A 09/26/2013   Procedure: COLONOSCOPY;  Surgeon: Rogene Houston, MD;  [facility];  Service: Endoscopy;  Laterality: N/A;  1030-rescheduled to 12:00pm Ann notified pt  . COLONOSCOPY N/A 03/27/2015   Procedure: COLONOSCOPY;  Surgeon: Rogene Houston, MD;  [facility];  Service: Endoscopy;  Laterality: N/A;  11:15 - moved to 8:25 - Ann to notify pt  . ESOPHAGOGASTRODUODENOSCOPY  12/08/2011   Procedure: ESOPHAGOGASTRODUODENOSCOPY (EGD);  Surgeon: Rogene Houston, MD;  [facility];  Service: Endoscopy;  Laterality: N/A;  3:00   . ESOPHAGOGASTRODUODENOSCOPY N/A 03/27/2015   Procedure: ESOPHAGOGASTRODUODENOSCOPY (EGD);  Surgeon: Rogene Houston, MD;  [facility];  Service: Endoscopy;  Laterality: N/A;  . EYE SURGERY Bilateral    cataract surgery  . GIVENS CAPSULE STUDY N/A 11/04/2015   Procedure: GIVENS CAPSULE STUDY;  Surgeon: Rogene Houston, MD;  [facility];  Service: Endoscopy;  Laterality: N/A;  . Hysterscopy    . KNEE ARTHROSCOPY     Left  .  NECK SURGERY     x 2   . TONSILLECTOMY    . TOTAL KNEE ARTHROPLASTY  07/11/2012   Procedure: TOTAL KNEE ARTHROPLASTY;  Surgeon: Ninetta Lights, MD;  Location: [facility];  Service: Orthopedics;  Laterality: Left;  left total knee arthroplasty  . UPPER GASTROINTESTINAL ENDOSCOPY    . YAG LASER APPLICATION Right 3/55/7322   Procedure: YAG LASER APPLICATION;  Surgeon: Rutherford Guys, MD;  Location: [facility];  Service: Ophthalmology;  Laterality: Right;     Current Outpatient Medications  Medication Sig Dispense Refill  . aspirin EC 81 MG  tablet Take 81 mg by mouth every morning.    . Biotin 1 MG CAPS Take 1 mg daily by mouth.     . Cholecalciferol (VITAMIN D) 2000 UNITS CAPS Take 1 capsule by mouth daily.      . diazepam (VALIUM) 5 MG tablet Take 5 mg by mouth every 8 (eight) hours as needed for anxiety.     . ferrous sulfate 325 (65 FE) MG tablet Take 1 tablet (325 mg total) by mouth daily with breakfast.    . gabapentin (NEURONTIN) 300 MG capsule Take 300 mg by mouth at bedtime.     . hydrochlorothiazide (HYDRODIURIL) 25 MG tablet Take 12.5 mg by mouth daily.    . metFORMIN (GLUCOPHAGE) 500 MG tablet Take 500 mg by mouth 2 (two) times daily with a meal.     . montelukast (SINGULAIR) 10 MG tablet Take 10 mg by mouth every morning.     Marland Kitchen omeprazole (PRILOSEC) 20 MG capsule TAKE 1 CAPSULE BY MOUTH TWICE DAILY BEFORE A MEAL 60 capsule 5  . potassium chloride (K-DUR) 10 MEQ tablet TAKE 1 TABLET(10 MEQ) BY MOUTH DAILY 30 tablet 5  . TURMERIC PO Take 1 tablet by mouth daily.     No current facility-administered medications for this visit.     Allergies:   Nsaids; Cephalexin; Darvocet [propoxyphene n-acetaminophen]; Percocet [oxycodone-acetaminophen]; and Penicillins    Social History:  The patient  reports that  has never smoked. she has never used smokeless tobacco. She reports that she does not drink alcohol or use drugs.   Family History:  The patient's family history includes Alzheimer's disease in her mother; Cancer in her maternal aunt and paternal grandmother; Diabetes in her maternal grandfather and maternal grandmother; Hypertension in her son; Kidney disease in her father, mother, and paternal uncle; Liver disease in her maternal uncle.    ROS: All other systems are reviewed and negative. Unless otherwise mentioned in H&P    PHYSICAL EXAM: VS:  BP 140/72   Pulse 74   Ht 5\' 3"  (1.6 m)   Wt 185 lb (83.9 kg)   SpO2 96%   BMI 32.77 kg/m  , BMI Body mass index is 32.77 kg/m. GEN: Well nourished, well developed, in  no acute distress  HEENT: normal  Neck: no JVD, carotid bruits, or masses Cardiac: RRR; 1/6 apical murmur, rubs, or gallops,no edema  Respiratory:  clear to auscultation bilaterally, normal work of breathing GI: soft, nontender, nondistended, + BS MS: no deformity or atrophy  Skin: warm and dry, no rash Neuro:  Strength and sensation are intact Psych: euthymic mood, full affect  Recent Labs: 11/01/2017: Hemoglobin 14.4; Platelets 242 11/03/2017: BUN 20; Creatinine, Ser 0.83; Potassium 3.9; Sodium 138    Lipid Panel    Component Value Date/Time   CHOL 113 11/03/2017 0515   TRIG 45 11/03/2017 0515   HDL 59 11/03/2017 0515   CHOLHDL  1.9 11/03/2017 0515   VLDL 9 11/03/2017 0515   LDLCALC 45 11/03/2017 0515      Wt Readings from Last 3 Encounters:  11/24/17 185 lb (83.9 kg)  11/22/17 184 lb (83.5 kg)  11/02/17 187 lb 11.6 oz (85.2 kg)     ASSESSMENT AND PLAN:  1.  Chest discomfort: She has been ruled out for ACS with recent hospitalization, low risk stress test.  She does have cardiovascular risk factors to include hypertension diabetes.  Continue primary prevention.  Labs per primary care physician.  2. Diabetes: Well controlled per primary care.  The patient is recently lost 35 pounds with diet and exercise.  She is feeling better.  She remains on metformin.  3.  Large hiatal hernia: Continues to follow with gastroenterology.  Further recommendations will be deferred to them and primary care.   Current medicines are reviewed at length with the patient today.    Labs/ tests ordered today include:  Phill Myron. West Pugh, ANP, AACC   11/24/2017 3:03 PM    [facility] [address], [place], [place] [address] Phone: [phone]; Fax: [phone]

## 2017-11-24 ENCOUNTER — Ambulatory Visit (INDEPENDENT_AMBULATORY_CARE_PROVIDER_SITE_OTHER): Payer: Medicare Other | Admitting: Adult Health

## 2017-11-24 ENCOUNTER — Encounter: Payer: Self-pay | Admitting: Adult Health

## 2017-11-24 VITALS — BP 140/72 | HR 74 | Ht 63.0 in | Wt 185.0 lb

## 2017-11-24 DIAGNOSIS — I10 Essential (primary) hypertension: Secondary | ICD-10-CM | POA: Diagnosis not present

## 2017-11-24 DIAGNOSIS — I200 Unstable angina: Secondary | ICD-10-CM

## 2017-11-24 DIAGNOSIS — R0789 Other chest pain: Secondary | ICD-10-CM | POA: Diagnosis not present

## 2017-11-24 NOTE — Patient Instructions (Signed)
Medication Instructions:  Your physician recommends that you continue on your current medications as directed. Please refer to the Current Medication list given to you today.   Labwork: NONE   Testing/Procedures: NONE  Follow-Up: Your physician wants you to follow-up in: 1 Year with Dr. McDowell. You will receive a reminder letter in the mail two months in advance. If you don't receive a letter, please call our office to schedule the follow-up appointment.   Any Other Special Instructions Will Be Listed Below (If Applicable).     If you need a refill on your cardiac medications before your next appointment, please call your pharmacy.  Thank you for choosing [facility]!   

## 2018-02-08 DIAGNOSIS — M5030 Other cervical disc degeneration, unspecified cervical region: Secondary | ICD-10-CM | POA: Diagnosis not present

## 2018-02-08 DIAGNOSIS — M4722 Other spondylosis with radiculopathy, cervical region: Secondary | ICD-10-CM | POA: Diagnosis not present

## 2018-02-08 DIAGNOSIS — M542 Cervicalgia: Secondary | ICD-10-CM | POA: Diagnosis not present

## 2018-02-10 DIAGNOSIS — E119 Type 2 diabetes mellitus without complications: Secondary | ICD-10-CM | POA: Diagnosis not present

## 2018-02-12 ENCOUNTER — Other Ambulatory Visit: Payer: Self-pay | Admitting: Neurosurgery

## 2018-02-12 DIAGNOSIS — M4722 Other spondylosis with radiculopathy, cervical region: Secondary | ICD-10-CM

## 2018-02-16 ENCOUNTER — Ambulatory Visit
Admission: RE | Admit: 2018-02-16 | Discharge: 2018-02-16 | Disposition: A | Discharge status: Home / Self Care | Payer: Medicare Other | Source: Ambulatory Visit | Attending: Neurosurgery | Admitting: Neurosurgery

## 2018-02-16 DIAGNOSIS — M4802 Spinal stenosis, cervical region: Secondary | ICD-10-CM | POA: Diagnosis not present

## 2018-02-16 DIAGNOSIS — M4722 Other spondylosis with radiculopathy, cervical region: Secondary | ICD-10-CM

## 2018-02-21 DIAGNOSIS — M5030 Other cervical disc degeneration, unspecified cervical region: Secondary | ICD-10-CM | POA: Diagnosis not present

## 2018-02-21 DIAGNOSIS — R030 Elevated blood-pressure reading, without diagnosis of hypertension: Secondary | ICD-10-CM | POA: Diagnosis not present

## 2018-02-21 DIAGNOSIS — M4722 Other spondylosis with radiculopathy, cervical region: Secondary | ICD-10-CM | POA: Diagnosis not present

## 2018-02-21 DIAGNOSIS — Z6832 Body mass index (BMI) 32.0-32.9, adult: Secondary | ICD-10-CM | POA: Diagnosis not present

## 2018-02-21 DIAGNOSIS — M542 Cervicalgia: Secondary | ICD-10-CM | POA: Diagnosis not present

## 2018-03-26 DIAGNOSIS — J069 Acute upper respiratory infection, unspecified: Secondary | ICD-10-CM | POA: Diagnosis not present

## 2018-03-26 DIAGNOSIS — E6609 Other obesity due to excess calories: Secondary | ICD-10-CM | POA: Diagnosis not present

## 2018-03-26 DIAGNOSIS — Z1389 Encounter for screening for other disorder: Secondary | ICD-10-CM | POA: Diagnosis not present

## 2018-03-26 DIAGNOSIS — Z6834 Body mass index (BMI) 34.0-34.9, adult: Secondary | ICD-10-CM | POA: Diagnosis not present

## 2018-03-26 DIAGNOSIS — I10 Essential (primary) hypertension: Secondary | ICD-10-CM | POA: Diagnosis not present

## 2018-03-26 DIAGNOSIS — E119 Type 2 diabetes mellitus without complications: Secondary | ICD-10-CM | POA: Diagnosis not present

## 2018-03-29 DIAGNOSIS — L570 Actinic keratosis: Secondary | ICD-10-CM | POA: Diagnosis not present

## 2018-03-29 DIAGNOSIS — Z1283 Encounter for screening for malignant neoplasm of skin: Secondary | ICD-10-CM | POA: Diagnosis not present

## 2018-03-29 DIAGNOSIS — D225 Melanocytic nevi of trunk: Secondary | ICD-10-CM | POA: Diagnosis not present

## 2018-03-29 DIAGNOSIS — E1165 Type 2 diabetes mellitus with hyperglycemia: Secondary | ICD-10-CM | POA: Diagnosis not present

## 2018-03-29 DIAGNOSIS — X32XXXD Exposure to sunlight, subsequent encounter: Secondary | ICD-10-CM | POA: Diagnosis not present

## 2018-03-31 ENCOUNTER — Other Ambulatory Visit (INDEPENDENT_AMBULATORY_CARE_PROVIDER_SITE_OTHER): Payer: Self-pay | Admitting: Internal Medicine

## 2018-04-16 ENCOUNTER — Other Ambulatory Visit (HOSPITAL_COMMUNITY): Payer: Self-pay | Admitting: Family Medicine

## 2018-04-16 DIAGNOSIS — Z1231 Encounter for screening mammogram for malignant neoplasm of breast: Secondary | ICD-10-CM

## 2018-04-24 ENCOUNTER — Ambulatory Visit (INDEPENDENT_AMBULATORY_CARE_PROVIDER_SITE_OTHER): Payer: Medicare Other | Admitting: Internal Medicine

## 2018-04-24 ENCOUNTER — Encounter (INDEPENDENT_AMBULATORY_CARE_PROVIDER_SITE_OTHER): Payer: Self-pay | Admitting: Internal Medicine

## 2018-04-24 ENCOUNTER — Other Ambulatory Visit (INDEPENDENT_AMBULATORY_CARE_PROVIDER_SITE_OTHER): Payer: Self-pay | Admitting: Internal Medicine

## 2018-04-24 VITALS — BP 120/74 | HR 70 | Temp 98.7°F | Resp 18 | Ht 62.5 in | Wt 184.6 lb

## 2018-04-24 DIAGNOSIS — Z862 Personal history of diseases of the blood and blood-forming organs and certain disorders involving the immune mechanism: Secondary | ICD-10-CM | POA: Diagnosis not present

## 2018-04-24 DIAGNOSIS — M542 Cervicalgia: Secondary | ICD-10-CM | POA: Diagnosis not present

## 2018-04-24 DIAGNOSIS — K219 Gastro-esophageal reflux disease without esophagitis: Secondary | ICD-10-CM | POA: Diagnosis not present

## 2018-04-24 DIAGNOSIS — R1030 Lower abdominal pain, unspecified: Secondary | ICD-10-CM

## 2018-04-24 MED ORDER — IBUPROFEN 200 MG PO TABS: 400.0000 mg | ORAL_TABLET | Freq: Two times a day (BID) | ORAL | 0 refills | Status: DC | PRN

## 2018-04-24 NOTE — Progress Notes (Signed)
Presenting complaint;  Follow-up for GERD and iron deficiency anemia. Patient complains of right lower quadrant abdominal pain.  Subjective:  Patient is 72 year old Caucasian female who is here for scheduled visit.  She was last seen on 10/24/2017.  She says she is doing well.  She rarely has heartburn.  She does skip evening dose of omeprazole sometimes.  She denies nausea vomiting or dysphagia.  She has good appetite.  Bowels usually move daily.  Occasionally she may have self-limiting bout of diarrhea.  She also complains of mild discomfort in right lower quadrant.  She denies hematuria.  This pain is fairly localized.  She is not sure if pain is triggered by meals or physical activity. She also complains of daily neck pain.  She has been seen by Dr. Sherwood Gambler.  She was told she had arthritis in surgery is not an option.  She feels she needs to take medication for relief and is willing to try low-dose NSAID. She is due for annual physical exam by Dr. Hilma Favors in July when she will have a complete blood count.   Current Medications: Outpatient Encounter Medications as of 04/24/2018  Medication Sig  . aspirin EC 81 MG tablet Take 81 mg by mouth every morning.  . Cholecalciferol (VITAMIN D) 2000 UNITS CAPS Take 1 capsule by mouth daily.    . diazepam (VALIUM) 5 MG tablet Take 5 mg by mouth every 8 (eight) hours as needed for anxiety.   . ferrous sulfate 325 (65 FE) MG tablet Take 1 tablet (325 mg total) by mouth daily with breakfast.  . gabapentin (NEURONTIN) 300 MG capsule Take 300 mg by mouth at bedtime.   . hydrochlorothiazide (HYDRODIURIL) 25 MG tablet Take 12.5 mg by mouth daily.  . metFORMIN (GLUCOPHAGE) 500 MG tablet Take 500 mg by mouth 2 (two) times daily with a meal.   . montelukast (SINGULAIR) 10 MG tablet Take 10 mg by mouth every morning.   Marland Kitchen omeprazole (PRILOSEC) 20 MG capsule TAKE 1 CAPSULE BY MOUTH TWICE DAILY BEFORE A MEAL  . potassium chloride (K-DUR) 10 MEQ tablet TAKE 1  TABLET(10 MEQ) BY MOUTH DAILY  . TURMERIC PO Take 1 tablet by mouth daily.  . [DISCONTINUED] Biotin 1 MG CAPS Take 1 mg daily by mouth.    No facility-administered encounter medications on file as of 04/24/2018.      Objective: Blood pressure 120/74, pulse 70, temperature 98.7 F (37.1 C), temperature source Oral, resp. rate 18, height 5' 2.5" (1.588 m), weight 184 lb 9.6 oz (83.7 kg). Patient is alert and in no acute distress. Conjunctiva is pink. Sclera is nonicteric Oropharyngeal mucosa is normal. No neck masses or thyromegaly noted. Cardiac exam with regular rhythm normal S1 and S2. No murmur or gallop noted. Lungs are clear to auscultation. Abdomen is full but soft and nontender without organomegaly or masses. No LE edema or clubbing noted.  Labs/studies Results:  CBC from 11/01/2017 WBC 9.7, H&H 14.4 and 43.1 and platelet count 242K.  Assessment:  #1.  Chronic GERD.  She has a moderate sized sliding hiatal hernia.  She is doing well with therapy.  She may try using a evening dose of PPI on as-needed basis but continue morning dose as before.  #2.  History of iron deficiency anemia.  She has undergone extensive work-up in the past.  She has history of peptic ulcer disease secondary to NSAID therapy.  H&H last year was normal.  #3.  Right lower quadrant abdominal pain.  Pain is  intermittent short-lived and not associated with other symptoms.  Will monitor this symptom before considering further work-up.  #4.  Neck pain.  Neck pain is felt to be due to arthritis.  She has been seen by neurosurgeon and felt not to be a candidate for surgery.  Patient is having daily pain and needs some relief.  She would like to try low-dose NSAIDs.  She is fully aware of potential side effects.   Plan:  Continue omeprazole 20 mg every morning and use evening dose on as-needed basis.  Will not change prescription for now. Patient can take Advil OTC 4 mg p.o. twice daily PRN.  If she experiences  epigastric pain or melena she should stop the medication and call us. Patient will call if she notices change in right lower quadrant abdominal pain. Will ask Dr. Hilma Favors to do iron studies with her blood work in 2 months. Office visit in 6 months.

## 2018-04-24 NOTE — Patient Instructions (Addendum)
Please call office if right lower quadrant abdominal pain becomes worse or occurs more frequently. Please asked Dr. Hilma Favors to also do iron studies when you have your next blood work in July 2019. Can take Advil 2 tablets twice daily as needed for neck pain.  Always take it with food.  Stop Advil if you experience epigastric pain.

## 2018-04-25 ENCOUNTER — Ambulatory Visit (HOSPITAL_COMMUNITY)
Admission: RE | Admit: 2018-04-25 | Discharge: 2018-04-25 | Disposition: A | Discharge status: Home / Self Care | Payer: Medicare Other | Source: Ambulatory Visit | Attending: Family Medicine | Admitting: Family Medicine

## 2018-04-25 DIAGNOSIS — Z1231 Encounter for screening mammogram for malignant neoplasm of breast: Secondary | ICD-10-CM | POA: Insufficient documentation

## 2018-06-24 ENCOUNTER — Encounter (HOSPITAL_COMMUNITY): Payer: Self-pay | Admitting: Emergency Medicine

## 2018-06-24 ENCOUNTER — Emergency Department (HOSPITAL_COMMUNITY): Payer: Medicare Other

## 2018-06-24 ENCOUNTER — Emergency Department (HOSPITAL_COMMUNITY)
Admission: EM | Admit: 2018-06-24 | Discharge: 2018-06-24 | Disposition: A | Discharge status: Home / Self Care | Payer: Medicare Other | Attending: Emergency Medicine | Admitting: Emergency Medicine

## 2018-06-24 DIAGNOSIS — Z7984 Long term (current) use of oral hypoglycemic drugs: Secondary | ICD-10-CM | POA: Diagnosis not present

## 2018-06-24 DIAGNOSIS — Z96652 Presence of left artificial knee joint: Secondary | ICD-10-CM | POA: Insufficient documentation

## 2018-06-24 DIAGNOSIS — R1031 Right lower quadrant pain: Secondary | ICD-10-CM | POA: Diagnosis present

## 2018-06-24 DIAGNOSIS — J45909 Unspecified asthma, uncomplicated: Secondary | ICD-10-CM | POA: Diagnosis not present

## 2018-06-24 DIAGNOSIS — N200 Calculus of kidney: Secondary | ICD-10-CM | POA: Diagnosis not present

## 2018-06-24 DIAGNOSIS — Z7982 Long term (current) use of aspirin: Secondary | ICD-10-CM | POA: Insufficient documentation

## 2018-06-24 DIAGNOSIS — N132 Hydronephrosis with renal and ureteral calculous obstruction: Secondary | ICD-10-CM | POA: Diagnosis not present

## 2018-06-24 DIAGNOSIS — I10 Essential (primary) hypertension: Secondary | ICD-10-CM | POA: Diagnosis not present

## 2018-06-24 DIAGNOSIS — N23 Unspecified renal colic: Secondary | ICD-10-CM | POA: Diagnosis not present

## 2018-06-24 DIAGNOSIS — R110 Nausea: Secondary | ICD-10-CM | POA: Diagnosis not present

## 2018-06-24 DIAGNOSIS — E119 Type 2 diabetes mellitus without complications: Secondary | ICD-10-CM | POA: Diagnosis not present

## 2018-06-24 DIAGNOSIS — R109 Unspecified abdominal pain: Secondary | ICD-10-CM | POA: Diagnosis not present

## 2018-06-24 LAB — COMPREHENSIVE METABOLIC PANEL
ALBUMIN: 4.4 g/dL (ref 3.5–5.0)
ALK PHOS: 58 U/L (ref 38–126)
ALT: 33 U/L (ref 0–44)
AST: 21 U/L (ref 15–41)
Anion gap: 8 (ref 5–15)
BILIRUBIN TOTAL: 0.6 mg/dL (ref 0.3–1.2)
BUN: 25 mg/dL — AB (ref 8–23)
CALCIUM: 9.3 mg/dL (ref 8.9–10.3)
CO2: 27 mmol/L (ref 22–32)
Chloride: 105 mmol/L (ref 98–111)
Creatinine, Ser: 1.57 mg/dL — ABNORMAL HIGH (ref 0.44–1.00)
GFR calc Af Amer: 37 mL/min — ABNORMAL LOW (ref >60)
GFR, EST NON AFRICAN AMERICAN: 32 mL/min — AB (ref >60)
GLUCOSE: 157 mg/dL — AB (ref 70–99)
Potassium: 3.8 mmol/L (ref 3.5–5.1)
Sodium: 140 mmol/L (ref 135–145)
TOTAL PROTEIN: 7.1 g/dL (ref 6.5–8.1)

## 2018-06-24 LAB — CBC WITH DIFFERENTIAL/PLATELET
Basophils Absolute: 0 10*3/uL (ref 0.0–0.1)
Basophils Relative: 0 %
EOS ABS: 0.1 10*3/uL (ref 0.0–0.7)
EOS PCT: 1 %
HCT: 45.9 % (ref 36.0–46.0)
Hemoglobin: 15 g/dL (ref 12.0–15.0)
LYMPHS ABS: 1.3 10*3/uL (ref 0.7–4.0)
LYMPHS PCT: 13 %
MCH: 30.3 pg (ref 26.0–34.0)
MCHC: 32.7 g/dL (ref 30.0–36.0)
MCV: 92.7 fL (ref 78.0–100.0)
MONO ABS: 0.4 10*3/uL (ref 0.1–1.0)
Monocytes Relative: 4 %
Neutro Abs: 8.1 10*3/uL — ABNORMAL HIGH (ref 1.7–7.7)
Neutrophils Relative %: 82 %
Platelets: 240 10*3/uL (ref 150–400)
RBC: 4.95 MIL/uL (ref 3.87–5.11)
RDW: 14.8 % (ref 11.5–15.5)
WBC: 9.8 10*3/uL (ref 4.0–10.5)

## 2018-06-24 LAB — URINALYSIS, ROUTINE W REFLEX MICROSCOPIC
BILIRUBIN URINE: NEGATIVE
GLUCOSE, UA: NEGATIVE mg/dL
Hgb urine dipstick: NEGATIVE
KETONES UR: NEGATIVE mg/dL
Leukocytes, UA: NEGATIVE
Nitrite: NEGATIVE
PH: 5 (ref 5.0–8.0)
Protein, ur: NEGATIVE mg/dL
Specific Gravity, Urine: 1.014 (ref 1.005–1.030)

## 2018-06-24 LAB — TROPONIN I: Troponin I: <0.03 ng/mL (ref <0.03)

## 2018-06-24 LAB — LIPASE, BLOOD: LIPASE: 35 U/L (ref 11–51)

## 2018-06-24 LAB — POC OCCULT BLOOD, ED: Fecal Occult Bld: NEGATIVE

## 2018-06-24 MED ORDER — HYDROMORPHONE HCL 1 MG/ML IJ SOLN
0.5000 mg | Freq: Once | INTRAMUSCULAR | Status: AC
  Administered 2018-06-24: 0.5 mg via INTRAVENOUS
  Filled 2018-06-24: qty 1

## 2018-06-24 MED ORDER — KETOROLAC TROMETHAMINE 30 MG/ML IJ SOLN
15.0000 mg | Freq: Once | INTRAMUSCULAR | Status: AC
  Administered 2018-06-24: 15 mg via INTRAVENOUS
  Filled 2018-06-24: qty 1

## 2018-06-24 MED ORDER — HYDROCODONE-ACETAMINOPHEN 5-325 MG PO TABS: 1.0000 | ORAL_TABLET | ORAL | 0 refills | Status: DC | PRN

## 2018-06-24 MED ORDER — ONDANSETRON HCL 4 MG/2ML IJ SOLN
4.0000 mg | Freq: Once | INTRAMUSCULAR | Status: AC
  Administered 2018-06-24: 4 mg via INTRAVENOUS
  Filled 2018-06-24: qty 2

## 2018-06-24 MED ORDER — ONDANSETRON HCL 4 MG PO TABS: 4.0000 mg | ORAL_TABLET | Freq: Four times a day (QID) | ORAL | 0 refills | Status: DC

## 2018-06-24 MED ORDER — IOPAMIDOL (ISOVUE-300) INJECTION 61%
30.0000 mL | Freq: Once | INTRAVENOUS | Status: AC | PRN
  Administered 2018-06-24: 30 mL via ORAL

## 2018-06-24 MED ORDER — ONDANSETRON HCL 4 MG PO TABS
4.0000 mg | ORAL_TABLET | Freq: Once | ORAL | Status: AC
  Administered 2018-06-24: 4 mg via ORAL
  Filled 2018-06-24: qty 1

## 2018-06-24 NOTE — ED Provider Notes (Signed)
[facility] EMERGENCY DEPARTMENT Provider Note   CSN: [id number] Arrival date & time: 06/24/18  0801     History   Chief Complaint Chief Complaint  Patient presents with  . Abdominal Pain    HPI [name] is a 72 y.o. female.  Patient is a 72 year old female who presents to the emergency department with a complaint of right lower quadrant area pain.  This problem started approximately 4 AM in the morning.  Patient describes it as sharp, and radiating to the mid lower portion of the abdomen.  No recent history of fall or injury to this area.  No recent operations or procedures noted.  Nausea present, but no actual vomiting.  No urinary symptoms reported.  No recent blood in the urine.  No changes in bowels, and no blood in the stools.  Patient denies previous history of similar problem  The history is provided by the patient.    Past Medical History:  Diagnosis Date  . Arthritis   . Asthma   . Carpal tunnel syndrome   . Chronic anxiety   . Gastric ulcer   . GERD (gastroesophageal reflux disease)   . H/O hiatal hernia   . History of heart murmur in childhood   . Hypertension   . Iron deficiency anemia   . 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 in left ear.  Left  side of body weaker than right.  Carpal tunnel bil .  Marland Kitchen Type 2 diabetes mellitus [facility])     Patient Active Problem List   Diagnosis Date Noted  . Well woman exam with routine gynecological exam 11/22/2017  . Controlled type 2 diabetes mellitus ([place]) 11/02/2017  . Nonspecific chest pain 11/02/2017  . Bilateral leg edema   . Diabetes ([place]) 03/28/2016  . Pain in the chest   . GERD (gastroesophageal reflux disease)   . Chest pain 01/05/2015  . Neuropathy 01/05/2015  . Bronchitis   . 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 Chest pain at rest 11/14/2013  . Left knee pain 09/18/2013  . Salmonella 05/24/2013  . Thrombocytopenia, unspecified  ([place]) 05/24/2013  . Acute gastroenteritis 05/21/2013  . Fever 05/21/2013  . Abnormal urinalysis 05/21/2013  . Hypokalemia 05/21/2013  . Dehydration 05/21/2013  . Coccydynia 05/07/2013  . Anemia 08/06/2012  . PUD (peptic ulcer disease) 02/06/2012  . OVERWEIGHT/OBESITY 12/21/2010  . Essential hypertension 12/29/2009  . Asthma 12/29/2009  . GERD 12/29/2009  . HIATAL HERNIA 12/29/2009  . HEART MURMUR, SYSTOLIC 97/35/3299    Past Surgical History:  Procedure Laterality Date  . APPENDECTOMY    . CHOLECYSTECTOMY    . COLONOSCOPY    . COLONOSCOPY N/A 09/26/2013   Procedure: COLONOSCOPY;  Surgeon: Rogene Houston, MD;  [facility];  Service: Endoscopy;  Laterality: N/A;  1030-rescheduled to 12:00pm Ann notified pt  . COLONOSCOPY N/A 03/27/2015   Procedure: COLONOSCOPY;  Surgeon: Rogene Houston, MD;  Location: [facility];  Service: Endoscopy;  Laterality: N/A;  11:15 - moved to 8:25 - Ann to notify pt  . ESOPHAGOGASTRODUODENOSCOPY  12/08/2011   Procedure: ESOPHAGOGASTRODUODENOSCOPY (EGD);  Surgeon: Rogene Houston, MD;  Location: [facility];  Service: Endoscopy;  Laterality: N/A;  3:00   . ESOPHAGOGASTRODUODENOSCOPY N/A 03/27/2015   Procedure: ESOPHAGOGASTRODUODENOSCOPY (EGD);  Surgeon: Rogene Houston, MD;  [facility];  Service: Endoscopy;  Laterality: N/A;  . EYE SURGERY Bilateral    cataract surgery  .  GIVENS CAPSULE STUDY N/A 11/04/2015   Procedure: GIVENS CAPSULE STUDY;  Surgeon: Rogene Houston, MD;  [facility];  Service: Endoscopy;  Laterality: N/A;  . Hysterscopy    . KNEE ARTHROSCOPY     Left  . NECK SURGERY     x 2   . TONSILLECTOMY    . TOTAL KNEE ARTHROPLASTY  07/11/2012   Procedure: TOTAL KNEE ARTHROPLASTY;  Surgeon: Ninetta Lights, MD;  Location: [place];  Service: Orthopedics;  Laterality: Left;  left total knee arthroplasty  . UPPER GASTROINTESTINAL ENDOSCOPY    . YAG LASER APPLICATION Right 07/10/9677   Procedure: YAG LASER  APPLICATION;  Surgeon: Rutherford Guys, MD;  Location: [facility];  Service: Ophthalmology;  Laterality: Right;     OB History    Gravida  1   Para      Term      Preterm      AB      Living  1     SAB      TAB      Ectopic      Multiple      Live Births               Home Medications    Prior to Admission medications   Medication Sig Start Date End Date Taking? Authorizing Provider  aspirin EC 81 MG tablet Take 81 mg by mouth every morning.    [provider]  Cholecalciferol (VITAMIN D) 2000 UNITS CAPS Take 1 capsule by mouth daily.      [provider]  diazepam (VALIUM) 5 MG tablet Take 5 mg by mouth every 8 (eight) hours as needed for anxiety.     [provider]  ferrous sulfate 325 (65 FE) MG tablet Take 1 tablet (325 mg total) by mouth daily with breakfast. 10/25/16   Rehman, Mechele Dawley, MD  gabapentin (NEURONTIN) 300 MG capsule Take 300 mg by mouth at bedtime.     [provider]  hydrochlorothiazide (HYDRODIURIL) 25 MG tablet Take 12.5 mg by mouth daily.    [provider]  ibuprofen (ADVIL,MOTRIN) 400 MG tablet TAKE 1 TABLET BY MOUTH TWICE DAILY AS NEEDED 04/24/18   Rehman, Mechele Dawley, MD  metFORMIN (GLUCOPHAGE) 500 MG tablet Take 500 mg by mouth 2 (two) times daily with a meal.     [provider]  montelukast (SINGULAIR) 10 MG tablet Take 10 mg by mouth every morning.     [provider]  omeprazole (PRILOSEC) 20 MG capsule TAKE 1 CAPSULE BY MOUTH TWICE DAILY BEFORE A MEAL 07/15/17   Rehman, Mechele Dawley, MD  potassium chloride (K-DUR) 10 MEQ tablet TAKE 1 TABLET(10 MEQ) BY MOUTH DAILY 04/04/18   Rehman, Mechele Dawley, MD  TURMERIC PO Take 1 tablet by mouth daily.    [provider]    Family History Family History  Problem Relation Age of Onset  . Hypertension Son   . Alzheimer's disease Mother   . Kidney disease Mother   . Kidney disease Father   . Cancer Maternal Aunt        leukemia  . Liver  disease Maternal Uncle   . Kidney disease Paternal Uncle        kidney removed  . Diabetes Maternal Grandmother   . Diabetes Maternal Grandfather   . Cancer Paternal Grandmother        breast, liver  . Colon cancer Neg Hx     Social History Social  History   Tobacco Use  . Smoking status: Never Smoker  . Smokeless tobacco: Never Used  Substance Use Topics  . Alcohol use: No    Alcohol/week: 0.0 oz  . Drug use: No     Allergies   Nsaids; Cephalexin; Darvocet [propoxyphene n-acetaminophen]; Percocet [oxycodone-acetaminophen]; and Penicillins   Review of Systems Review of Systems  Constitutional: Negative for activity change.       All ROS Neg except as noted in HPI  HENT: Negative for nosebleeds.   Eyes: Negative for photophobia and discharge.  Respiratory: Negative for cough, shortness of breath and wheezing.   Cardiovascular: Negative for chest pain and palpitations.  Gastrointestinal: Positive for abdominal pain and nausea. Negative for blood in stool.  Genitourinary: Negative for dysuria, frequency and hematuria.  Musculoskeletal: Negative for arthralgias, back pain and neck pain.  Skin: Negative.   Neurological: Negative for dizziness, seizures and speech difficulty.  Psychiatric/Behavioral: Negative for confusion and hallucinations.     Physical Exam Updated Vital Signs BP (!) 170/71 (BP Location: Right Arm)   Pulse 68   Temp (!) 97.5 F (36.4 C) (Oral)   Resp 18   Ht 5\' 2"  (1.575 m)   Wt 83.9 kg (185 lb)   SpO2 96%   BMI 33.84 kg/m   Physical Exam  Constitutional: She is oriented to person, place, and time. She appears well-developed and well-nourished.  Non-toxic appearance.  HENT:  Head: Normocephalic.  Right Ear: Tympanic membrane and external ear normal.  Left Ear: Tympanic membrane and external ear normal.  Eyes: Pupils are equal, round, and reactive to light. EOM and lids are normal.  Neck: Normal range of motion. Neck supple. Carotid bruit is  not present.  Cardiovascular: Normal rate, regular rhythm, intact distal pulses and normal pulses.  Murmur heard.  Systolic murmur is present with a grade of 2/6. Pulmonary/Chest: Breath sounds normal. No respiratory distress.  Abdominal: Soft. Normal appearance and bowel sounds are normal. She exhibits no distension, no ascites and no pulsatile midline mass. There is no splenomegaly or hepatomegaly. There is tenderness in the right lower quadrant. There is no rigidity and no guarding.  Musculoskeletal: Normal range of motion.  Lymphadenopathy:       Head (right side): No submandibular adenopathy present.       Head (left side): No submandibular adenopathy present.    She has no cervical adenopathy.  Neurological: She is alert and oriented to person, place, and time. She has normal strength. No cranial nerve deficit or sensory deficit.  Skin: Skin is warm and dry.  Psychiatric: She has a normal mood and affect. Her speech is normal.  Nursing note and vitals reviewed.    ED Treatments / Results  Labs (all labs ordered are listed, but only abnormal results are displayed) Labs Reviewed  CBC WITH DIFFERENTIAL/PLATELET  COMPREHENSIVE METABOLIC PANEL  LIPASE, BLOOD  URINALYSIS, ROUTINE W REFLEX MICROSCOPIC  TROPONIN I  POC OCCULT BLOOD, ED    EKG None  Radiology No results found.  Procedures Procedures (including critical care time)  Medications Ordered in ED Medications  HYDROmorphone (DILAUDID) injection 0.5 mg (has no administration in time range)  ondansetron (ZOFRAN) injection 4 mg (has no administration in time range)     Initial Impression / Assessment and Plan / ED Course  I have reviewed the triage vital signs and the nursing notes.  Pertinent labs & imaging results that were available during my care of the patient were reviewed by me and considered in  my medical decision making (see chart for details).       Final Clinical Impressions(s) / ED  Diagnoses Vital signs reviewed.  Pulse oximetry is within normal limits by my interpretation.  Patient states that she has been having problems with right lower quadrant pain since about 4 AM.  She says that she has a history of kidney stones, but this does not feel like her kidney stones.  The pain is getting progressively worse and she is having a difficult time finding a comfortable position to lie in.  We will check for urinary tract infection, evidence of general systemic infection, evaluate for possible aneurysmal pain, will also check for kidney stone even though patient does not feel that is what this is.  And will evaluate for musculoskeletal related problem.  Patient states that the initial dose of 0.5 mg of Dilaudid did not help her.  Will re-dose the patient. Pt seen with me by Dr Eulis Foster.  Stool for occult blood is negative.  Urinalysis is negative for acute problem. Complete blood count is nonacute.  Conference of metabolic panel shows the BUN to be elevated at 25 and the creatinine elevated at 1.57.  Patient has some chronic renal issues.  Glucose is 157. Lipase is normal at 35.  Troponin is less than 0.03, doubt cardiac source for pain.  CT scan of the abdomen and pelvis reveals a 3 mm right ureteropelvic junction stone with mild right hydronephrosis and moderate right perinephritic stranding.  It is also of note that the patient has bilateral nephrolithiasis that are nonobstructing.  Patient has a large hiatal hernia present.  Patient feeling significantly improved after the second dose of pain medication.  I discussed the findings with the patient including the CT scan findings.  The patient will follow-up with urology.  She will strain all urine.  Patient given medication to use for pain in the event that she should have another attack before being seen by urology.  Questions were answered by patient and family.  Patient is in agreement with this plan.   Final diagnoses:  Renal  colic on right side  Kidney stones    ED Discharge Orders        Ordered    HYDROcodone-acetaminophen (NORCO/VICODIN) 5-325 MG tablet  Every 4 hours PRN     06/24/18 1505    ondansetron (ZOFRAN) 4 MG tablet  Every 6 hours     06/24/18 1505       Lily Kocher, PA-C 06/26/18 1628    Daleen Bo, MD 06/26/18 2246

## 2018-06-24 NOTE — ED Provider Notes (Addendum)
  Face-to-face evaluation   History: Right lower quadrant abdominal pain, present for 1 day.  Remote history of kidney stones.  Physical exam: Alert elderly female.  Evaluated after treatment she is comfortable.  No respiratory distress.  She is lucid.  Evaluation consistent with distal right ureteral stone which has a high likelihood of passing, at 3 mm size.  Medical screening examination/treatment/procedure(s) were conducted as a shared visit with non-physician practitioner(s) and myself.  I personally evaluated the patient during the encounter       Daleen Bo, MD 06/26/18 [phone]

## 2018-06-24 NOTE — Discharge Instructions (Addendum)
Your blood test are within normal limits with the exception of elevation in your kidney function.  Your stool is negative for hidden blood.  Your CT scan shows a 3 mm kidney stone on the right.  Please strain all urine.  Please discuss this with your urology specialist, or call the urology specialist on your discharge instructions.  Please use Tylenol extra strength for mild pain, use Norco for more severe pain.  Norco may cause drowsiness, and/or lightheadedness.  Please use this medication with caution.  Return to the emergency department if any changes in your condition, problems, or concerns.

## 2018-06-24 NOTE — ED Triage Notes (Signed)
Pt reports right lower quad pain since around 4am this morning.  Associated nausea.  Denies urinary s/s, vomiting, or diarrhea.

## 2018-06-26 DIAGNOSIS — D509 Iron deficiency anemia, unspecified: Secondary | ICD-10-CM | POA: Diagnosis not present

## 2018-06-26 DIAGNOSIS — I10 Essential (primary) hypertension: Secondary | ICD-10-CM | POA: Diagnosis not present

## 2018-06-26 DIAGNOSIS — Z6834 Body mass index (BMI) 34.0-34.9, adult: Secondary | ICD-10-CM | POA: Diagnosis not present

## 2018-06-26 DIAGNOSIS — D519 Vitamin B12 deficiency anemia, unspecified: Secondary | ICD-10-CM | POA: Diagnosis not present

## 2018-06-26 DIAGNOSIS — N200 Calculus of kidney: Secondary | ICD-10-CM | POA: Diagnosis not present

## 2018-06-26 DIAGNOSIS — E119 Type 2 diabetes mellitus without complications: Secondary | ICD-10-CM | POA: Diagnosis not present

## 2018-06-26 DIAGNOSIS — E559 Vitamin D deficiency, unspecified: Secondary | ICD-10-CM | POA: Diagnosis not present

## 2018-06-26 DIAGNOSIS — E1140 Type 2 diabetes mellitus with diabetic neuropathy, unspecified: Secondary | ICD-10-CM | POA: Diagnosis not present

## 2018-06-26 DIAGNOSIS — E6609 Other obesity due to excess calories: Secondary | ICD-10-CM | POA: Diagnosis not present

## 2018-07-12 ENCOUNTER — Ambulatory Visit (INDEPENDENT_AMBULATORY_CARE_PROVIDER_SITE_OTHER): Payer: Medicare Other | Admitting: Otolaryngology

## 2018-07-12 DIAGNOSIS — H903 Sensorineural hearing loss, bilateral: Secondary | ICD-10-CM

## 2018-08-07 ENCOUNTER — Other Ambulatory Visit (INDEPENDENT_AMBULATORY_CARE_PROVIDER_SITE_OTHER): Payer: Self-pay | Admitting: Internal Medicine

## 2018-08-09 DIAGNOSIS — H04123 Dry eye syndrome of bilateral lacrimal glands: Secondary | ICD-10-CM | POA: Diagnosis not present

## 2018-08-09 DIAGNOSIS — H5211 Myopia, right eye: Secondary | ICD-10-CM | POA: Diagnosis not present

## 2018-08-09 DIAGNOSIS — E119 Type 2 diabetes mellitus without complications: Secondary | ICD-10-CM | POA: Diagnosis not present

## 2018-08-09 DIAGNOSIS — H04213 Epiphora due to excess lacrimation, bilateral lacrimal glands: Secondary | ICD-10-CM | POA: Diagnosis not present

## 2018-08-09 DIAGNOSIS — E1140 Type 2 diabetes mellitus with diabetic neuropathy, unspecified: Secondary | ICD-10-CM | POA: Diagnosis not present

## 2018-09-10 DIAGNOSIS — E6609 Other obesity due to excess calories: Secondary | ICD-10-CM | POA: Diagnosis not present

## 2018-09-10 DIAGNOSIS — Z0001 Encounter for general adult medical examination with abnormal findings: Secondary | ICD-10-CM | POA: Diagnosis not present

## 2018-09-10 DIAGNOSIS — Z23 Encounter for immunization: Secondary | ICD-10-CM | POA: Diagnosis not present

## 2018-09-10 DIAGNOSIS — Z6834 Body mass index (BMI) 34.0-34.9, adult: Secondary | ICD-10-CM | POA: Diagnosis not present

## 2018-09-10 DIAGNOSIS — Z1389 Encounter for screening for other disorder: Secondary | ICD-10-CM | POA: Diagnosis not present

## 2018-09-30 ENCOUNTER — Other Ambulatory Visit (INDEPENDENT_AMBULATORY_CARE_PROVIDER_SITE_OTHER): Payer: Self-pay | Admitting: Internal Medicine

## 2018-10-01 DIAGNOSIS — E6609 Other obesity due to excess calories: Secondary | ICD-10-CM | POA: Diagnosis not present

## 2018-10-01 DIAGNOSIS — E119 Type 2 diabetes mellitus without complications: Secondary | ICD-10-CM | POA: Diagnosis not present

## 2018-10-01 DIAGNOSIS — J302 Other seasonal allergic rhinitis: Secondary | ICD-10-CM | POA: Diagnosis not present

## 2018-10-01 DIAGNOSIS — E785 Hyperlipidemia, unspecified: Secondary | ICD-10-CM | POA: Diagnosis not present

## 2018-10-01 DIAGNOSIS — E559 Vitamin D deficiency, unspecified: Secondary | ICD-10-CM | POA: Diagnosis not present

## 2018-10-01 DIAGNOSIS — Z6834 Body mass index (BMI) 34.0-34.9, adult: Secondary | ICD-10-CM | POA: Diagnosis not present

## 2018-10-01 DIAGNOSIS — G43909 Migraine, unspecified, not intractable, without status migrainosus: Secondary | ICD-10-CM | POA: Diagnosis not present

## 2018-10-25 DIAGNOSIS — E6609 Other obesity due to excess calories: Secondary | ICD-10-CM | POA: Diagnosis not present

## 2018-10-25 DIAGNOSIS — J069 Acute upper respiratory infection, unspecified: Secondary | ICD-10-CM | POA: Diagnosis not present

## 2018-10-25 DIAGNOSIS — J302 Other seasonal allergic rhinitis: Secondary | ICD-10-CM | POA: Diagnosis not present

## 2018-10-25 DIAGNOSIS — Z6834 Body mass index (BMI) 34.0-34.9, adult: Secondary | ICD-10-CM | POA: Diagnosis not present

## 2018-10-30 ENCOUNTER — Encounter (INDEPENDENT_AMBULATORY_CARE_PROVIDER_SITE_OTHER): Payer: Self-pay | Admitting: Internal Medicine

## 2018-10-30 ENCOUNTER — Ambulatory Visit (INDEPENDENT_AMBULATORY_CARE_PROVIDER_SITE_OTHER): Payer: Medicare Other | Admitting: Internal Medicine

## 2018-10-30 ENCOUNTER — Other Ambulatory Visit (INDEPENDENT_AMBULATORY_CARE_PROVIDER_SITE_OTHER): Payer: Self-pay | Admitting: Internal Medicine

## 2018-10-30 VITALS — BP 140/90 | HR 74 | Temp 99.2°F | Resp 18 | Ht 62.5 in | Wt 184.6 lb

## 2018-10-30 DIAGNOSIS — K219 Gastro-esophageal reflux disease without esophagitis: Secondary | ICD-10-CM

## 2018-10-30 DIAGNOSIS — K580 Irritable bowel syndrome with diarrhea: Secondary | ICD-10-CM | POA: Diagnosis not present

## 2018-10-30 MED ORDER — FAMOTIDINE 20 MG PO TABS: 20.0000 mg | ORAL_TABLET | Freq: Every evening | ORAL | Status: DC | PRN

## 2018-10-30 MED ORDER — OMEPRAZOLE 20 MG PO CPDR: 20.0000 mg | DELAYED_RELEASE_CAPSULE | Freq: Every day | ORAL | 3 refills | Status: DC

## 2018-10-30 MED ORDER — HYOSCYAMINE SULFATE 0.125 MG SL SUBL: 0.1250 mg | SUBLINGUAL_TABLET | Freq: Four times a day (QID) | SUBLINGUAL | 1 refills | Status: DC | PRN

## 2018-10-30 NOTE — Progress Notes (Signed)
Presenting complaint;  Follow-up for GERD and IBS. History of iron deficiency anemia.  Database and subjective:  Patient is 72 year old Caucasian female who is here for scheduled visit.  She was last seen in April 2019.  She has history of iron deficiency anemia for which she underwent endoscopic evaluation in 2014 and repeat evaluation with EGD colonoscopy and given capsule study in 2016.  Her hemoglobin has remained normal over the last 2 years or so. She also has chronic GERD and known large sliding hiatal hernia as well as history of IBS with diarrhea.  Patient states she is doing well as far as her GERD symptoms are concerned.  She rarely has heartburn with certain foods.  She denies a regurgitation dysphagia or throat symptoms.  She has HOB elevated by 4 inches.  She eats her supper around 6 PM and goes to bed 4 hours later.  She denies melena or rectal bleeding. She continues to have intermittent spells of diarrhea when she has urgency which is so strong that she is not even able to make it to the bathroom and has had accidents.  A few years ago she tried probiotics but it did not help.  She feels her diet has enough fiber.  She has good appetite and her weight has been stable. She states she was in emergency room on 06/24/2018 because of renal colic when she had blood work.  She says she has had follow-up serum creatinine and it has returned to normal.   Current Medications: Outpatient Encounter Medications as of 10/30/2018  Medication Sig  . Biotin w/ Vitamins C & E (HAIR/SKIN/NAILS PO) Take by mouth.  . Cholecalciferol (VITAMIN D) 2000 UNITS CAPS Take 1 capsule by mouth daily.    . diazepam (VALIUM) 5 MG tablet Take 5 mg by mouth every 8 (eight) hours as needed for anxiety.   . ferrous sulfate 325 (65 FE) MG tablet Take 1 tablet (325 mg total) by mouth daily with breakfast.  . gabapentin (NEURONTIN) 300 MG capsule Take 300 mg by mouth at bedtime.   . hydrochlorothiazide (HYDRODIURIL)  25 MG tablet Take 12.5 mg by mouth daily.  . metFORMIN (GLUCOPHAGE) 500 MG tablet Take 500 mg by mouth 2 (two) times daily with a meal.   . montelukast (SINGULAIR) 10 MG tablet Take 10 mg by mouth every morning.   Marland Kitchen omeprazole (PRILOSEC) 20 MG capsule TAKE 1 CAPSULE BY MOUTH TWICE DAILY BEFORE A MEAL  . potassium chloride (K-DUR) 10 MEQ tablet TAKE 1 TABLET(10 MEQ) BY MOUTH DAILY  . TURMERIC PO Take 1 tablet by mouth daily.  . [DISCONTINUED] aspirin EC 81 MG tablet Take 81 mg by mouth every morning.  . [DISCONTINUED] HYDROcodone-acetaminophen (NORCO/VICODIN) 5-325 MG tablet Take 1 tablet by mouth every 4 (four) hours as needed. (Patient not taking: Reported on 10/30/2018)  . [DISCONTINUED] ibuprofen (ADVIL,MOTRIN) 400 MG tablet TAKE 1 TABLET BY MOUTH TWICE DAILY AS NEEDED (Patient not taking: Reported on 10/30/2018)  . [DISCONTINUED] ondansetron (ZOFRAN) 4 MG tablet Take 1 tablet (4 mg total) by mouth every 6 (six) hours. (Patient not taking: Reported on 10/30/2018)   No facility-administered encounter medications on file as of 10/30/2018.      Objective: Blood pressure 140/90, pulse 74, temperature 99.2 F (37.3 C), temperature source Oral, resp. rate 18, height 5' 2.5" (1.588 m), weight 184 lb 9.6 oz (83.7 kg). Patient is alert and in no acute distress. Conjunctiva is pink. Sclera is nonicteric Oropharyngeal mucosa is normal. No neck masses  or thyromegaly noted. Cardiac exam with regular rhythm normal S1 and S2.  Faint systolic murmur noted at upper left sternal border. Lungs are clear to auscultation. Abdomen is full but soft and nontender with organomegaly or masses. No LE edema or clubbing noted.  Labs/studies Results:  Lab data from 06/24/2018 WBC 9.8, H&H 15.0 and 45.9 MCV 92.7 and platelet count 240K.  FOBT was negative.  BUN 25 and creatinine was 1.57 Bilirubin 0.6, AP 58, AST 21, ALT 33, total protein 7.1 and albumin 4.4. Serum calcium 9.3.  CT images from 06/24/2018  reviewed. Large hiatal hernia. Fatty liver with hepatic cysts. Bilateral nephrolithiasis with a 3 mm stone in right UPJ with mild right-sided hydronephrosis and perinephric standing.  Large right renal cyst.  CT from 09/29/2011 revealed multiple hepatic cysts.  Assessment:  #1.  GERD.  She has known large sliding hiatal hernia.  She is doing well with antireflux measures and low-dose PPI.  #2.  Irritable bowel syndrome.  She remains with intermittent urgency and diarrhea.  She may benefit from as needed use of antispasmodic.  Since symptoms are sporadic she may not need scheduled antispasmodic.  #3.  History of iron deficiency anemia for which she has been thoroughly evaluated.  Hemoglobin has remained normal since December, 2016.  Her anemia was felt to be due to blood loss from upper GI tract and impaired iron absorption in setting of chronic PPI therapy.   Plan:  New prescription for omeprazole sent to patient's pharmacy.  Dose is 20 mg p.o. every morning. Famotidine OTC 20 mg p.o. nightly as needed for breakthrough symptoms. Hyoscyamine sublingual.  Patient instructed to use 1 to 2 tablets at a time and dose should not exceed more than 4 tablets/day. Patient will keep symptom diary and call with progress report in 1 month. If she does not respond to antispasmodic will consider 2-week course of Xifaxan. Office visit in 6 months.

## 2018-10-30 NOTE — Patient Instructions (Signed)
Please call office with progress report in 1 month regarding IBS symptoms.

## 2018-12-12 IMAGING — DX DG CHEST 2V
2 series · 2 of 2 positions shown · non-contrast
Comparison: 02/23/2017

CLINICAL DATA: Chest pain

EXAM:
CHEST  2 VIEW

[chest pa]
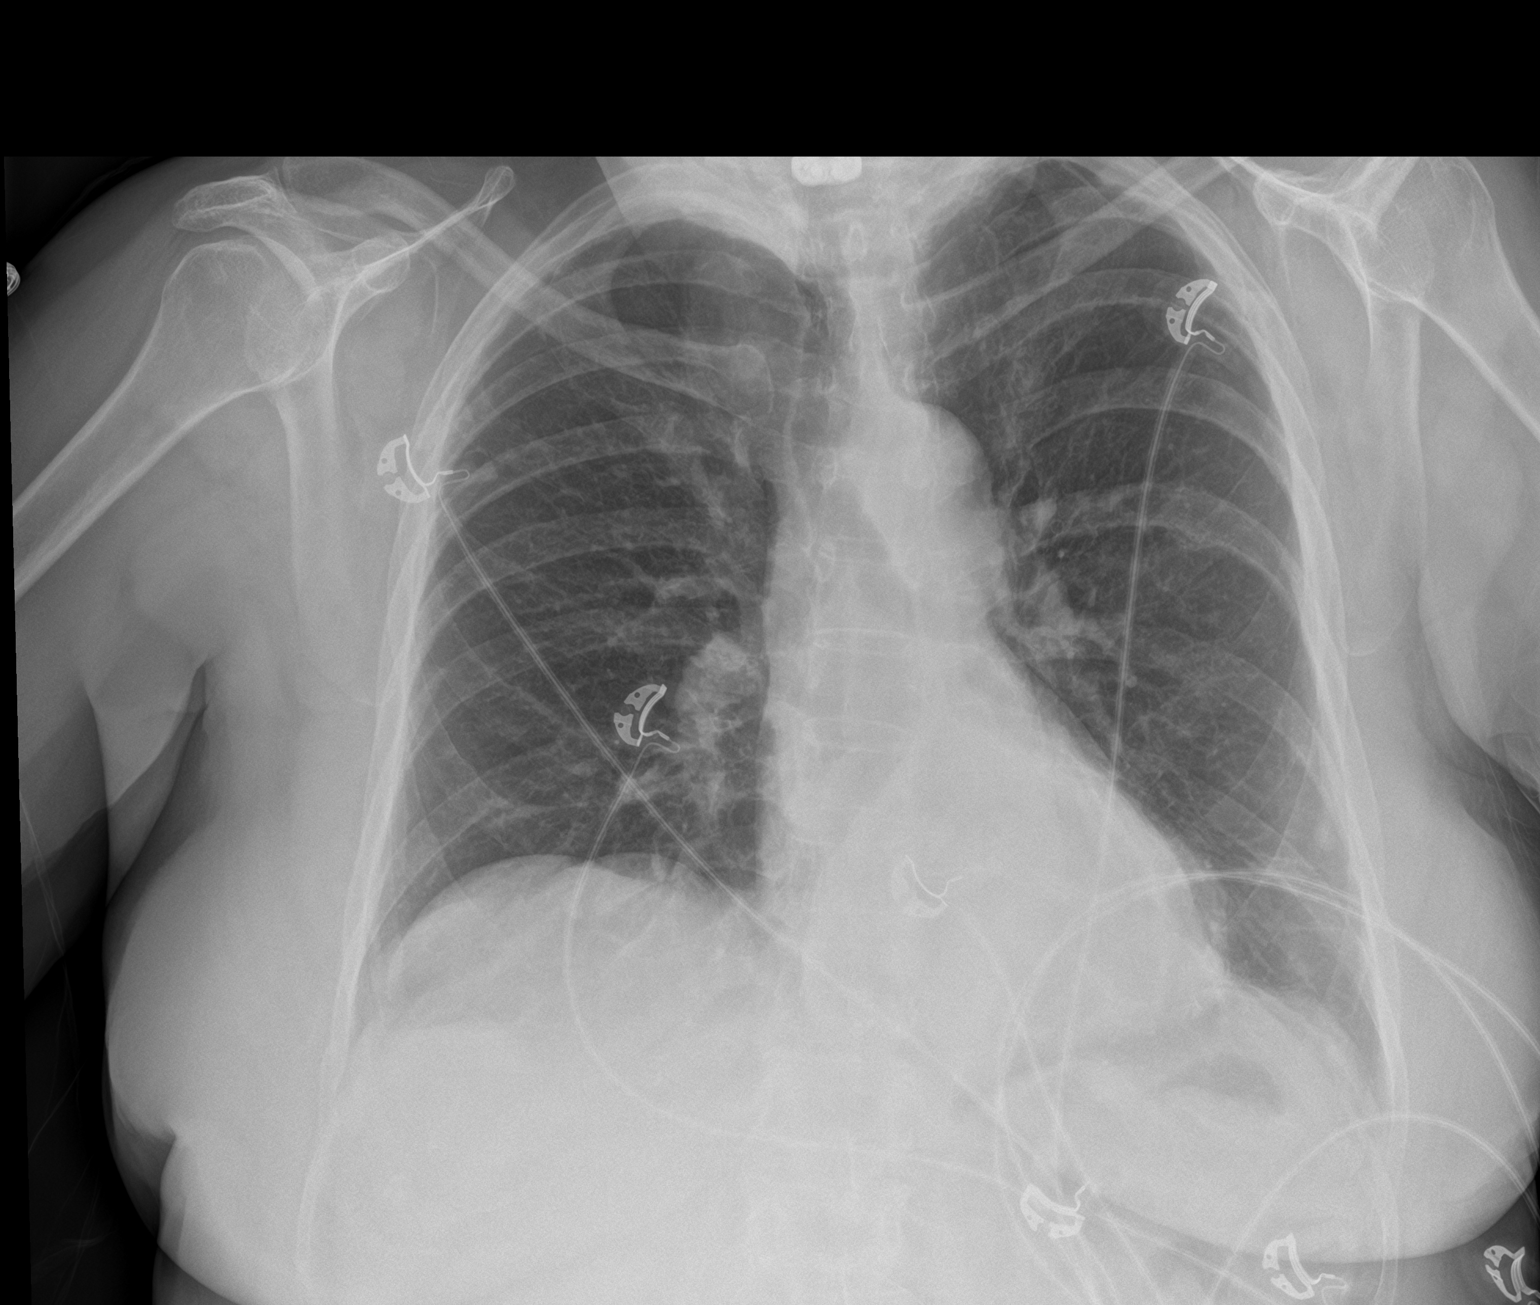

[chest lat]
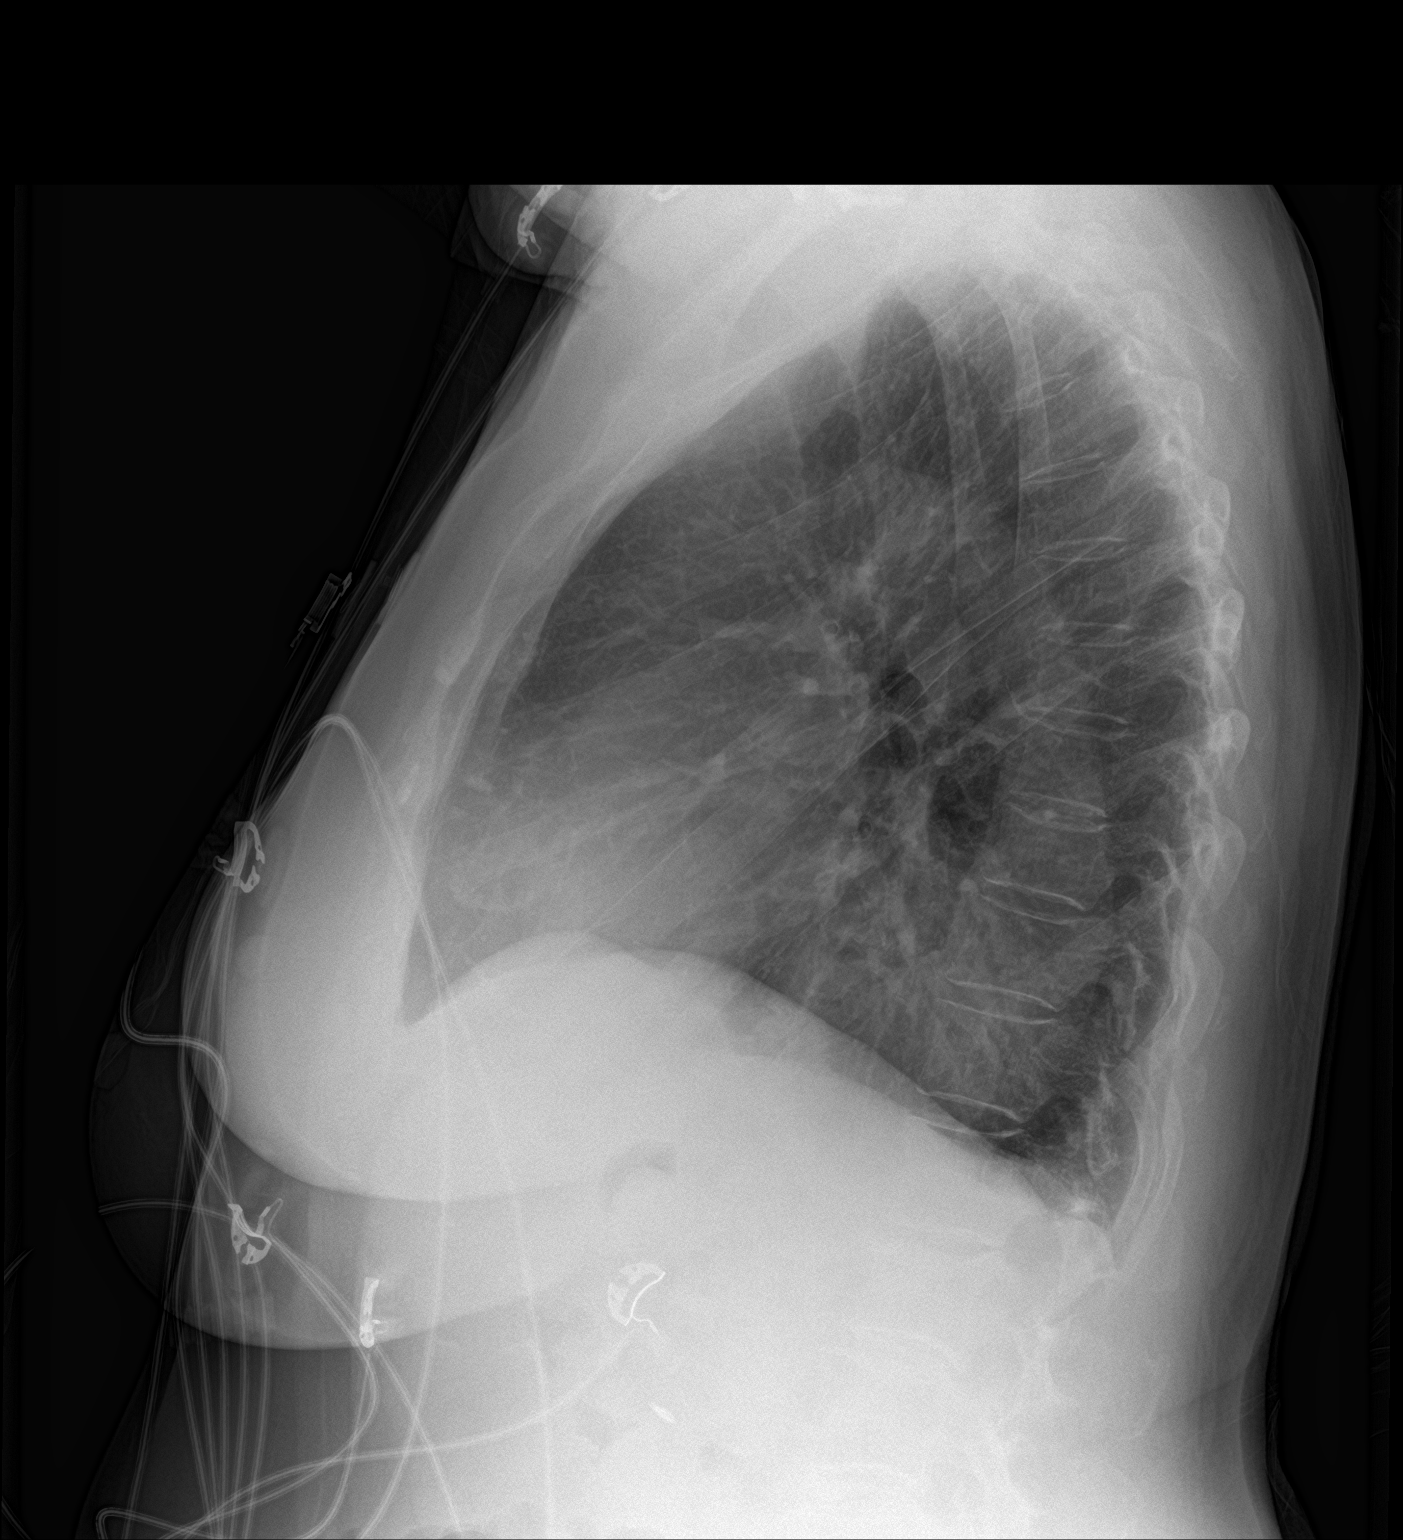

[2 of 2 positions shown; findings below may reference images not displayed]

FINDINGS: Lower cervical hardware. No acute consolidation or effusion. Normal
heart size. Moderate hiatal hernia. Stable left lower lung nodule.
No pneumothorax.
IMPRESSION: No active cardiopulmonary disease.  Moderate hiatal hernia

## 2019-01-02 DIAGNOSIS — E119 Type 2 diabetes mellitus without complications: Secondary | ICD-10-CM | POA: Diagnosis not present

## 2019-01-02 DIAGNOSIS — E6609 Other obesity due to excess calories: Secondary | ICD-10-CM | POA: Diagnosis not present

## 2019-01-02 DIAGNOSIS — Z6834 Body mass index (BMI) 34.0-34.9, adult: Secondary | ICD-10-CM | POA: Diagnosis not present

## 2019-01-02 DIAGNOSIS — I10 Essential (primary) hypertension: Secondary | ICD-10-CM | POA: Diagnosis not present

## 2019-02-05 DIAGNOSIS — Z6834 Body mass index (BMI) 34.0-34.9, adult: Secondary | ICD-10-CM | POA: Diagnosis not present

## 2019-02-05 DIAGNOSIS — Z1389 Encounter for screening for other disorder: Secondary | ICD-10-CM | POA: Diagnosis not present

## 2019-02-05 DIAGNOSIS — R509 Fever, unspecified: Secondary | ICD-10-CM | POA: Diagnosis not present

## 2019-02-05 DIAGNOSIS — E6609 Other obesity due to excess calories: Secondary | ICD-10-CM | POA: Diagnosis not present

## 2019-02-05 DIAGNOSIS — R05 Cough: Secondary | ICD-10-CM | POA: Diagnosis not present

## 2019-02-05 DIAGNOSIS — J0110 Acute frontal sinusitis, unspecified: Secondary | ICD-10-CM | POA: Diagnosis not present

## 2019-02-05 DIAGNOSIS — J069 Acute upper respiratory infection, unspecified: Secondary | ICD-10-CM | POA: Diagnosis not present

## 2019-02-05 DIAGNOSIS — J029 Acute pharyngitis, unspecified: Secondary | ICD-10-CM | POA: Diagnosis not present

## 2019-04-02 ENCOUNTER — Other Ambulatory Visit (INDEPENDENT_AMBULATORY_CARE_PROVIDER_SITE_OTHER): Payer: Self-pay | Admitting: Internal Medicine

## 2019-04-08 DIAGNOSIS — E559 Vitamin D deficiency, unspecified: Secondary | ICD-10-CM | POA: Diagnosis not present

## 2019-04-08 DIAGNOSIS — I10 Essential (primary) hypertension: Secondary | ICD-10-CM | POA: Diagnosis not present

## 2019-04-08 DIAGNOSIS — D509 Iron deficiency anemia, unspecified: Secondary | ICD-10-CM | POA: Diagnosis not present

## 2019-04-08 DIAGNOSIS — Z1389 Encounter for screening for other disorder: Secondary | ICD-10-CM | POA: Diagnosis not present

## 2019-04-08 DIAGNOSIS — E6609 Other obesity due to excess calories: Secondary | ICD-10-CM | POA: Diagnosis not present

## 2019-04-08 DIAGNOSIS — E119 Type 2 diabetes mellitus without complications: Secondary | ICD-10-CM | POA: Diagnosis not present

## 2019-04-08 DIAGNOSIS — Z6833 Body mass index (BMI) 33.0-33.9, adult: Secondary | ICD-10-CM | POA: Diagnosis not present

## 2019-04-08 DIAGNOSIS — Z13220 Encounter for screening for lipoid disorders: Secondary | ICD-10-CM | POA: Diagnosis not present

## 2019-04-30 ENCOUNTER — Encounter (INDEPENDENT_AMBULATORY_CARE_PROVIDER_SITE_OTHER): Payer: Self-pay | Admitting: Internal Medicine

## 2019-04-30 ENCOUNTER — Other Ambulatory Visit: Payer: Self-pay

## 2019-04-30 ENCOUNTER — Ambulatory Visit (INDEPENDENT_AMBULATORY_CARE_PROVIDER_SITE_OTHER): Payer: Medicare Other | Admitting: Internal Medicine

## 2019-04-30 DIAGNOSIS — M150 Primary generalized (osteo)arthritis: Secondary | ICD-10-CM

## 2019-04-30 DIAGNOSIS — M159 Polyosteoarthritis, unspecified: Secondary | ICD-10-CM

## 2019-04-30 DIAGNOSIS — K219 Gastro-esophageal reflux disease without esophagitis: Secondary | ICD-10-CM | POA: Diagnosis not present

## 2019-04-30 DIAGNOSIS — K580 Irritable bowel syndrome with diarrhea: Secondary | ICD-10-CM

## 2019-04-30 MED ORDER — IBUPROFEN 400 MG PO TABS: 400.0000 mg | ORAL_TABLET | ORAL | 0 refills | Status: DC | PRN

## 2019-04-30 NOTE — Progress Notes (Signed)
Virtual Visit via Telephone Note  Patient had scheduled visit today.  Visit was canceled because of ongoing COVID-19 pandemic.  Patient requested tele-visit and I agreed. I connected with [name] on 04/30/19 at  9:30 AM EDT by telephone and verified that I am speaking with the correct person using two identifiers.  Location: Patient: At home Provider: At our office   I discussed the limitations, risks, security and privacy concerns of performing an evaluation and management service by telephone and the availability of in person appointments. I also discussed with the patient that there may be a patient responsible charge related to this service. The patient expressed understanding and agreed to proceed.   History of Present Illness:  Patient is 73 year old Caucasian female who has chronic GERD known large hiatal hernia as well as history of iron deficiency anemia and IBS who was last seen in November 2019. Patient states she is doing very well.  She rarely has heartburn.  She denies dysphagia nausea or vomiting.  She has dry mouth at night.  Her ENT specialist told her to drink water when she gets up to use the bathroom.  She states she is a mouth breather.  She states she is watching her diet.  She has not been able to lose anymore weight.  She generally has 1 formed stool per day.  Stool at times is dark because she is on iron.  She denies rectal bleeding.  She is been having intermittent lower abdominal pain primarily in the right lower quadrant.  Dr. Hilma Favors felt that pain was gas pain.  She has been using Gas-X which has helped. She does not take Tylenol daily.  She takes 1 dose of Valium every day.  She has not taken Pepcid in over a month for breakthrough symptoms.  Similarly she has used very few doses of Levsin in the last 6 months. She has few doses of ibuprofen left and she needs new prescription.  She may take couple of doses per month.  She is not having any side effects.  She is  fully aware of side effects.  She has joint pains involving multiple joints including her knees and shoulders.   Medications reviewed and updated.  Observations/Objective:  Weight 183 pounds according to Dr. Delanna Ahmadi office last week.  Assessment and Plan:  #1.  Chronic GERD.  Patient has known large hiatal hernia.  She is doing well with dietary measures and  PPI therapy and prn famotidine.  #2.  IBS with diarrhea.  Once again she appears to be doing better and requiring antispasmodic sporadically.  #3.  History of osteoarthrosis.  She is using ibuprofen sparingly.  New prescription will be issued.  #4.  History of iron deficiency anemia secondary to GI bleed from upper GI tract and impaired iron absorption.  Last evaluation was over 3 years ago.  Hemoglobin in June last year was 15.0.  She remains on p.o. iron.   Follow Up Instructions:  Patient will continue current therapy. New prescription for ibuprofen 400 mg p.o. twice daily as needed 60 doses without refill sent to her pharmacy. Will request copy of recent blood work from Dr. Delanna Ahmadi office. Office visit in 6 months.  I discussed the assessment and treatment plan with the patient. The patient was provided an opportunity to ask questions and all were answered. The patient agreed with the plan and demonstrated an understanding of the instructions.   The patient was advised to call back or seek an in-person  evaluation if the symptoms worsen or if the condition fails to improve as anticipated.  I provided 12 minutes of non-face-to-face time during this encounter.   Hildred Laser, MD

## 2019-04-30 NOTE — Patient Instructions (Signed)
Pain recently will request copy of recent blood work from Dr. Delanna Ahmadi office.

## 2019-05-14 ENCOUNTER — Other Ambulatory Visit (INDEPENDENT_AMBULATORY_CARE_PROVIDER_SITE_OTHER): Payer: Self-pay | Admitting: Internal Medicine

## 2019-06-05 ENCOUNTER — Other Ambulatory Visit (HOSPITAL_COMMUNITY): Payer: Self-pay | Admitting: Family Medicine

## 2019-06-05 DIAGNOSIS — Z1231 Encounter for screening mammogram for malignant neoplasm of breast: Secondary | ICD-10-CM

## 2019-06-06 ENCOUNTER — Other Ambulatory Visit: Payer: Self-pay

## 2019-06-06 ENCOUNTER — Ambulatory Visit (HOSPITAL_COMMUNITY)
Admission: RE | Admit: 2019-06-06 | Discharge: 2019-06-06 | Disposition: A | Discharge status: Home / Self Care | Payer: Medicare Other | Source: Ambulatory Visit | Attending: Family Medicine | Admitting: Family Medicine

## 2019-06-06 DIAGNOSIS — Z1231 Encounter for screening mammogram for malignant neoplasm of breast: Secondary | ICD-10-CM | POA: Diagnosis not present

## 2019-06-29 ENCOUNTER — Other Ambulatory Visit (INDEPENDENT_AMBULATORY_CARE_PROVIDER_SITE_OTHER): Payer: Self-pay | Admitting: Internal Medicine

## 2019-07-10 DIAGNOSIS — E119 Type 2 diabetes mellitus without complications: Secondary | ICD-10-CM | POA: Diagnosis not present

## 2019-07-10 DIAGNOSIS — E559 Vitamin D deficiency, unspecified: Secondary | ICD-10-CM | POA: Diagnosis not present

## 2019-07-10 DIAGNOSIS — Z6833 Body mass index (BMI) 33.0-33.9, adult: Secondary | ICD-10-CM | POA: Diagnosis not present

## 2019-07-10 DIAGNOSIS — Z1389 Encounter for screening for other disorder: Secondary | ICD-10-CM | POA: Diagnosis not present

## 2019-07-10 DIAGNOSIS — I10 Essential (primary) hypertension: Secondary | ICD-10-CM | POA: Diagnosis not present

## 2019-07-10 DIAGNOSIS — D509 Iron deficiency anemia, unspecified: Secondary | ICD-10-CM | POA: Diagnosis not present

## 2019-07-11 ENCOUNTER — Ambulatory Visit (INDEPENDENT_AMBULATORY_CARE_PROVIDER_SITE_OTHER): Payer: Medicare Other | Admitting: Otolaryngology

## 2019-07-11 DIAGNOSIS — H9313 Tinnitus, bilateral: Secondary | ICD-10-CM

## 2019-07-11 DIAGNOSIS — H9312 Tinnitus, left ear: Secondary | ICD-10-CM

## 2019-07-11 DIAGNOSIS — H903 Sensorineural hearing loss, bilateral: Secondary | ICD-10-CM | POA: Diagnosis not present

## 2019-08-12 DIAGNOSIS — E119 Type 2 diabetes mellitus without complications: Secondary | ICD-10-CM | POA: Diagnosis not present

## 2019-08-12 DIAGNOSIS — H5201 Hypermetropia, right eye: Secondary | ICD-10-CM | POA: Diagnosis not present

## 2019-08-12 DIAGNOSIS — Z961 Presence of intraocular lens: Secondary | ICD-10-CM | POA: Diagnosis not present

## 2019-08-12 DIAGNOSIS — H04123 Dry eye syndrome of bilateral lacrimal glands: Secondary | ICD-10-CM | POA: Diagnosis not present

## 2019-08-14 DIAGNOSIS — Z23 Encounter for immunization: Secondary | ICD-10-CM | POA: Diagnosis not present

## 2019-09-26 DIAGNOSIS — Z1283 Encounter for screening for malignant neoplasm of skin: Secondary | ICD-10-CM | POA: Diagnosis not present

## 2019-09-26 DIAGNOSIS — L570 Actinic keratosis: Secondary | ICD-10-CM | POA: Diagnosis not present

## 2019-09-26 DIAGNOSIS — D225 Melanocytic nevi of trunk: Secondary | ICD-10-CM | POA: Diagnosis not present

## 2019-09-26 DIAGNOSIS — X32XXXD Exposure to sunlight, subsequent encounter: Secondary | ICD-10-CM | POA: Diagnosis not present

## 2019-09-30 DIAGNOSIS — Z23 Encounter for immunization: Secondary | ICD-10-CM | POA: Diagnosis not present

## 2019-10-15 DIAGNOSIS — Z1389 Encounter for screening for other disorder: Secondary | ICD-10-CM | POA: Diagnosis not present

## 2019-10-15 DIAGNOSIS — R7309 Other abnormal glucose: Secondary | ICD-10-CM | POA: Diagnosis not present

## 2019-10-15 DIAGNOSIS — I10 Essential (primary) hypertension: Secondary | ICD-10-CM | POA: Diagnosis not present

## 2019-10-15 DIAGNOSIS — Z0001 Encounter for general adult medical examination with abnormal findings: Secondary | ICD-10-CM | POA: Diagnosis not present

## 2019-10-15 DIAGNOSIS — Z6834 Body mass index (BMI) 34.0-34.9, adult: Secondary | ICD-10-CM | POA: Diagnosis not present

## 2019-10-21 DIAGNOSIS — Z681 Body mass index (BMI) 19 or less, adult: Secondary | ICD-10-CM | POA: Diagnosis not present

## 2019-10-21 DIAGNOSIS — J069 Acute upper respiratory infection, unspecified: Secondary | ICD-10-CM | POA: Diagnosis not present

## 2019-10-24 ENCOUNTER — Other Ambulatory Visit (HOSPITAL_COMMUNITY): Payer: Self-pay | Admitting: Family Medicine

## 2019-10-24 DIAGNOSIS — E2839 Other primary ovarian failure: Secondary | ICD-10-CM

## 2019-10-26 ENCOUNTER — Other Ambulatory Visit: Payer: Self-pay

## 2019-10-26 ENCOUNTER — Emergency Department (HOSPITAL_COMMUNITY)
Admission: EM | Admit: 2019-10-26 | Discharge: 2019-10-27 | Disposition: A | Discharge status: Home / Self Care | Payer: Medicare Other | Attending: Emergency Medicine | Admitting: Emergency Medicine

## 2019-10-26 ENCOUNTER — Encounter (HOSPITAL_COMMUNITY): Payer: Self-pay

## 2019-10-26 DIAGNOSIS — E860 Dehydration: Secondary | ICD-10-CM | POA: Insufficient documentation

## 2019-10-26 DIAGNOSIS — Z79899 Other long term (current) drug therapy: Secondary | ICD-10-CM | POA: Diagnosis not present

## 2019-10-26 DIAGNOSIS — I10 Essential (primary) hypertension: Secondary | ICD-10-CM | POA: Insufficient documentation

## 2019-10-26 DIAGNOSIS — R509 Fever, unspecified: Secondary | ICD-10-CM | POA: Diagnosis not present

## 2019-10-26 DIAGNOSIS — U071 COVID-19: Secondary | ICD-10-CM | POA: Diagnosis not present

## 2019-10-26 DIAGNOSIS — E119 Type 2 diabetes mellitus without complications: Secondary | ICD-10-CM | POA: Insufficient documentation

## 2019-10-26 DIAGNOSIS — Z96652 Presence of left artificial knee joint: Secondary | ICD-10-CM | POA: Diagnosis not present

## 2019-10-26 DIAGNOSIS — R0789 Other chest pain: Secondary | ICD-10-CM | POA: Diagnosis not present

## 2019-10-26 DIAGNOSIS — R079 Chest pain, unspecified: Secondary | ICD-10-CM | POA: Diagnosis not present

## 2019-10-26 DIAGNOSIS — Z7984 Long term (current) use of oral hypoglycemic drugs: Secondary | ICD-10-CM | POA: Insufficient documentation

## 2019-10-26 DIAGNOSIS — J45909 Unspecified asthma, uncomplicated: Secondary | ICD-10-CM | POA: Diagnosis not present

## 2019-10-26 DIAGNOSIS — R0902 Hypoxemia: Secondary | ICD-10-CM | POA: Diagnosis not present

## 2019-10-26 NOTE — ED Triage Notes (Signed)
4 baby aspirin given by ems

## 2019-10-26 NOTE — ED Triage Notes (Signed)
Pt called ems for cp due to cough. Was dx with covid Wednesday. Whole household is covid positive. Pt alert and oriented. Unable to cough anything up.

## 2019-10-27 ENCOUNTER — Emergency Department (HOSPITAL_COMMUNITY): Payer: Medicare Other

## 2019-10-27 ENCOUNTER — Other Ambulatory Visit (INDEPENDENT_AMBULATORY_CARE_PROVIDER_SITE_OTHER): Payer: Self-pay | Admitting: Internal Medicine

## 2019-10-27 DIAGNOSIS — R079 Chest pain, unspecified: Secondary | ICD-10-CM | POA: Diagnosis not present

## 2019-10-27 DIAGNOSIS — R509 Fever, unspecified: Secondary | ICD-10-CM | POA: Diagnosis not present

## 2019-10-27 DIAGNOSIS — U071 COVID-19: Secondary | ICD-10-CM | POA: Diagnosis not present

## 2019-10-27 LAB — COMPREHENSIVE METABOLIC PANEL
ALT: 27 U/L (ref 0–44)
AST: 24 U/L (ref 15–41)
Albumin: 3.9 g/dL (ref 3.5–5.0)
Alkaline Phosphatase: 56 U/L (ref 38–126)
Anion gap: 8 (ref 5–15)
BUN: 20 mg/dL (ref 8–23)
CO2: 23 mmol/L (ref 22–32)
Calcium: 8.8 mg/dL — ABNORMAL LOW (ref 8.9–10.3)
Chloride: 102 mmol/L (ref 98–111)
Creatinine, Ser: 1.11 mg/dL — ABNORMAL HIGH (ref 0.44–1.00)
GFR calc Af Amer: 57 mL/min — ABNORMAL LOW (ref >60)
GFR calc non Af Amer: 50 mL/min — ABNORMAL LOW (ref >60)
Glucose, Bld: 123 mg/dL — ABNORMAL HIGH (ref 70–99)
Potassium: 3.5 mmol/L (ref 3.5–5.1)
Sodium: 133 mmol/L — ABNORMAL LOW (ref 135–145)
Total Bilirubin: 0.5 mg/dL (ref 0.3–1.2)
Total Protein: 7 g/dL (ref 6.5–8.1)

## 2019-10-27 LAB — CBC WITH DIFFERENTIAL/PLATELET
Abs Immature Granulocytes: 0.01 10*3/uL (ref 0.00–0.07)
Basophils Absolute: 0 10*3/uL (ref 0.0–0.1)
Basophils Relative: 0 %
Eosinophils Absolute: 0 10*3/uL (ref 0.0–0.5)
Eosinophils Relative: 0 %
HCT: 45.7 % (ref 36.0–46.0)
Hemoglobin: 15 g/dL (ref 12.0–15.0)
Immature Granulocytes: 0 %
Lymphocytes Relative: 22 %
Lymphs Abs: 0.9 10*3/uL (ref 0.7–4.0)
MCH: 29.7 pg (ref 26.0–34.0)
MCHC: 32.8 g/dL (ref 30.0–36.0)
MCV: 90.5 fL (ref 80.0–100.0)
Monocytes Absolute: 0.3 10*3/uL (ref 0.1–1.0)
Monocytes Relative: 7 %
Neutro Abs: 3.1 10*3/uL (ref 1.7–7.7)
Neutrophils Relative %: 71 %
Platelets: 153 10*3/uL (ref 150–400)
RBC: 5.05 MIL/uL (ref 3.87–5.11)
RDW: 14.1 % (ref 11.5–15.5)
WBC: 4.3 10*3/uL (ref 4.0–10.5)
nRBC: 0 % (ref 0.0–0.2)

## 2019-10-27 LAB — PROCALCITONIN: Procalcitonin: <0.1 ng/mL

## 2019-10-27 LAB — FERRITIN: Ferritin: 175 ng/mL (ref 11–307)

## 2019-10-27 LAB — TRIGLYCERIDES: Triglycerides: 65 mg/dL (ref <150)

## 2019-10-27 LAB — LACTIC ACID, PLASMA
Lactic Acid, Venous: 1.1 mmol/L (ref 0.5–1.9)
Lactic Acid, Venous: 1 mmol/L (ref 0.5–1.9)

## 2019-10-27 LAB — D-DIMER, QUANTITATIVE: D-Dimer, Quant: 0.31 ug/mL-FEU (ref 0.00–0.50)

## 2019-10-27 LAB — FIBRINOGEN: Fibrinogen: 444 mg/dL (ref 210–475)

## 2019-10-27 LAB — LACTATE DEHYDROGENASE: LDH: 170 U/L (ref 98–192)

## 2019-10-27 LAB — C-REACTIVE PROTEIN: CRP: 1.7 mg/dL — ABNORMAL HIGH (ref <1.0)

## 2019-10-27 MED ORDER — SODIUM CHLORIDE 0.9 % IV BOLUS
1000.0000 mL | Freq: Once | INTRAVENOUS | Status: AC
  Administered 2019-10-27: 02:00:00 1000 mL via INTRAVENOUS

## 2019-10-27 MED ORDER — PREDNISONE 50 MG PO TABS
60.0000 mg | ORAL_TABLET | Freq: Once | ORAL | Status: AC
  Administered 2019-10-27: 60 mg via ORAL
  Filled 2019-10-27: qty 1

## 2019-10-27 MED ORDER — PREDNISONE 20 MG PO TABS: ORAL_TABLET | ORAL | 0 refills | Status: DC

## 2019-10-27 MED ORDER — SODIUM CHLORIDE 0.9 % IV BOLUS
500.0000 mL | Freq: Once | INTRAVENOUS | Status: AC
  Administered 2019-10-27: 500 mL via INTRAVENOUS

## 2019-10-27 NOTE — Discharge Instructions (Addendum)
Take the steroids until gone.  Try to drink more fluids.  Monitor your temperature, take acetaminophen 650 mg plus ibuprofen 600 mg every 6 hours as needed for fever.  Take Imodium AD over-the-counter for diarrhea.  You can get a portable oxygen monitor to monitor your oxygen, if your oxygen level drops into the 80s you should be rechecked. You do need to talk to your primary care doctor once you are better about getting a CT scan of your chest.  They see something that was on your x-ray in 2018 and seems slightly bigger that is next to your airway that needs to be clarified better.

## 2019-10-27 NOTE — ED Notes (Signed)
Pt calling for ride

## 2019-10-27 NOTE — ED Provider Notes (Addendum)
[facility] EMERGENCY DEPARTMENT Provider Note   CSN: [id number] Arrival date & time: 10/26/19  2329   Time seen 12:35 AM  History   Chief Complaint Chief Complaint  Patient presents with  . Chest Pain    covid    HPI [name] is a 73 y.o. female.     HPI   patient reports she started feeling ill on October 24.  She was seen at her doctor's office on the 26th and had a Covid test which came back positive on the 28th.  She states since it started she has had a dry cough, fever of 10 1-1 02 however tonight her fever got up to 103 and she got scared.  She denies chills.  She has had a sore throat and states right now her throat is sore.  She has lost her sense of smell and taste.  She states she is having about 2 loose stools a day but denies nausea or vomiting.  She states she has mild abdominal pain in her lower abdomen before the diarrhea.  She denies feeling short of breath but states she has any upper central chest pain that is constant but is made worse by coughing and she has had that all week long.  She also states she has a sliding hiatal hernia and she thinks that is acting up.  Patient states when her doctor saw her on the 19 she was given [employer] and a Z-Pak.  She states she got sick first and now her husband and her son and youngest grandson have all been tested positive.  She states her grandson was only sick 2 days.  They had had dinner at her house the day before she got sick.  Patient states she is afraid she is dehydrated.  PCP Sharilyn Sites, MD   Past Medical History:  Diagnosis Date  . Arthritis   . Asthma   . Carpal tunnel syndrome   . Chronic anxiety   . Gastric ulcer   . GERD (gastroesophageal reflux disease)   . H/O hiatal hernia   . History of heart murmur in childhood   . Hypertension   . Iron deficiency anemia   . 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 in left ear.  Left  side of body weaker than  right.  Carpal tunnel bil .  Marland Kitchen Type 2 diabetes mellitus [facility])     Patient Active Problem List   Diagnosis Date Noted  . Well woman exam with routine gynecological exam 11/22/2017  . Controlled type 2 diabetes mellitus ([place]) 11/02/2017  . Nonspecific chest pain 11/02/2017  . Bilateral leg edema   . Diabetes ([place]) 03/28/2016  . Pain in the chest   . GERD (gastroesophageal reflux disease)   . Chest pain 01/05/2015  . Neuropathy 01/05/2015  . Bronchitis   . 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 Chest pain at rest 11/14/2013  . Left knee pain 09/18/2013  . Salmonella 05/24/2013  . Thrombocytopenia, unspecified ([place]) 05/24/2013  . Acute gastroenteritis 05/21/2013  . Fever 05/21/2013  . Abnormal urinalysis 05/21/2013  . Hypokalemia 05/21/2013  . Dehydration 05/21/2013  . Coccydynia 05/07/2013  . Anemia 08/06/2012  . PUD (peptic ulcer disease) 02/06/2012  . OVERWEIGHT/OBESITY 12/21/2010  . Essential hypertension 12/29/2009  . Asthma 12/29/2009  . GERD 12/29/2009  . HIATAL HERNIA 12/29/2009  . HEART MURMUR, SYSTOLIC [id number]    Past Surgical History:  Procedure Laterality  Date  . APPENDECTOMY    . CHOLECYSTECTOMY    . COLONOSCOPY    . COLONOSCOPY N/A 09/26/2013   Procedure: COLONOSCOPY;  Surgeon: Rogene Houston, MD;  Location: [facility];  Service: Endoscopy;  Laterality: N/A;  1030-rescheduled to 12:00pm Ann notified pt  . COLONOSCOPY N/A 03/27/2015   Procedure: COLONOSCOPY;  Surgeon: Rogene Houston, MD;  Location: [facility];  Service: Endoscopy;  Laterality: N/A;  11:15 - moved to 8:25 - Ann to notify pt  . ESOPHAGOGASTRODUODENOSCOPY  12/08/2011   Procedure: ESOPHAGOGASTRODUODENOSCOPY (EGD);  Surgeon: Rogene Houston, MD;  Location: [facility];  Service: Endoscopy;  Laterality: N/A;  3:00   . ESOPHAGOGASTRODUODENOSCOPY N/A 03/27/2015   Procedure: ESOPHAGOGASTRODUODENOSCOPY (EGD);  Surgeon: Rogene Houston, MD;  Location: [facility];   Service: Endoscopy;  Laterality: N/A;  . EYE SURGERY Bilateral    cataract surgery  . GIVENS CAPSULE STUDY N/A 11/04/2015   Procedure: GIVENS CAPSULE STUDY;  Surgeon: Rogene Houston, MD;  Location: [facility];  Service: Endoscopy;  Laterality: N/A;  . Hysterscopy    . KNEE ARTHROSCOPY     Left  . NECK SURGERY     x 2   . TONSILLECTOMY    . TOTAL KNEE ARTHROPLASTY  07/11/2012   Procedure: TOTAL KNEE ARTHROPLASTY;  Surgeon: Ninetta Lights, MD;  Location: [place];  Service: Orthopedics;  Laterality: Left;  left total knee arthroplasty  . UPPER GASTROINTESTINAL ENDOSCOPY    . YAG LASER APPLICATION Right [id number]   Procedure: YAG LASER APPLICATION;  Surgeon: Rutherford Guys, MD;  Location: [facility];  Service: Ophthalmology;  Laterality: Right;     OB History    Gravida  1   Para      Term      Preterm      AB      Living  1     SAB      TAB      Ectopic      Multiple      Live Births               Home Medications    Prior to Admission medications   Medication Sig Start Date End Date Taking? Authorizing Provider  acetaminophen (TYLENOL) 500 MG tablet Take 500 mg by mouth as needed.    [provider]  Cholecalciferol (VITAMIN D) 2000 UNITS CAPS Take 1 capsule by mouth daily.      [provider]  diazepam (VALIUM) 5 MG tablet Take 5 mg by mouth every 8 (eight) hours as needed for anxiety.     [provider]  famotidine (PEPCID) 20 MG tablet Take 1 tablet (20 mg total) by mouth at bedtime as needed for heartburn or indigestion. 10/30/18   Rogene Houston, MD  ferrous sulfate 325 (65 FE) MG tablet Take 1 tablet (325 mg total) by mouth daily with breakfast. 10/25/16   Rehman, Mechele Dawley, MD  gabapentin (NEURONTIN) 300 MG capsule Take 300 mg by mouth at bedtime.     [provider]  hydrochlorothiazide (HYDRODIURIL) 25 MG tablet Take 12.5 mg by mouth daily.    [provider]  hyoscyamine (LEVSIN SL) 0.125 MG SL tablet  DISSOLVE 1 TABLET(0.125 MG) UNDER THE TONGUE EVERY 6 HOURS AS NEEDED 10/31/18   Setzer, Rona Ravens, NP  ibuprofen (ADVIL) 400 MG tablet Take 1 tablet (400 mg total) by mouth as needed for moderate pain (ostoarthritis). 1-2 tables twice daily as  needed. Patient takes for Arthritis. 04/30/19   Rehman, Mechele Dawley, MD  metFORMIN (GLUCOPHAGE) 500 MG tablet Take 500 mg by mouth 2 (two) times daily with a meal.     [provider]  montelukast (SINGULAIR) 10 MG tablet Take 10 mg by mouth every morning.     [provider]  omeprazole (PRILOSEC) 20 MG capsule Take 1 capsule (20 mg total) by mouth daily before breakfast. 10/30/18   Rehman, Mechele Dawley, MD  potassium chloride (K-DUR) 10 MEQ tablet TAKE 1 TABLET(10 MEQ) BY MOUTH DAILY 07/01/19   Rehman, Mechele Dawley, MD  predniSONE (DELTASONE) 20 MG tablet Take 3 po QD x 3d , then 2 po QD x 3d then 1 po QD x 3d 10/27/19   Rolland Porter, MD  TURMERIC PO Take 1 tablet by mouth daily.    [provider]    Family History Family History  Problem Relation Age of Onset  . Hypertension Son   . Alzheimer's disease Mother   . Kidney disease Mother   . Kidney disease Father   . Cancer Maternal Aunt        leukemia  . Liver disease Maternal Uncle   . Kidney disease Paternal Uncle        kidney removed  . Diabetes Maternal Grandmother   . Diabetes Maternal Grandfather   . Cancer Paternal Grandmother        breast, liver  . Colon cancer Neg Hx     Social History Social History   Tobacco Use  . Smoking status: Never Smoker  . Smokeless tobacco: Never Used  Substance Use Topics  . Alcohol use: No    Alcohol/week: 0.0 standard drinks  . Drug use: No     Allergies   Nsaids, Cephalexin, Darvocet [propoxyphene n-acetaminophen], Percocet [oxycodone-acetaminophen], and Penicillins   Review of Systems Review of Systems  All other systems reviewed and are negative.    Physical Exam Updated Vital Signs BP (!) 145/67 (BP Location: Left Arm)    Pulse (!) 106   Temp 99.9 F (37.7 C) (Oral)   Resp 20   Ht 5\' 4"  (1.626 m)   Wt 81.6 kg   SpO2 94%   BMI 30.90 kg/m   Vital signs normal except for tachycardia and low-grade temp   Physical Exam Vitals signs and nursing note reviewed.  Constitutional:      General: She is not in acute distress.    Appearance: Normal appearance. She is well-developed. She is not ill-appearing or toxic-appearing.  HENT:     Head: Normocephalic and atraumatic.     Right Ear: External ear normal.     Left Ear: External ear normal.     Nose: Nose normal. No mucosal edema or rhinorrhea.     Mouth/Throat:     Mouth: Mucous membranes are dry.     Dentition: No dental abscesses.     Pharynx: Oropharynx is clear. No oropharyngeal exudate, posterior oropharyngeal erythema or uvula swelling.  Eyes:     Extraocular Movements: Extraocular movements intact.     Conjunctiva/sclera: Conjunctivae normal.     Pupils: Pupils are equal, round, and reactive to light.  Neck:     Musculoskeletal: Full passive range of motion without pain, normal range of motion and neck supple.  Cardiovascular:     Rate and Rhythm: Regular rhythm. Tachycardia present.     Heart sounds: Normal heart sounds. No murmur. No friction rub. No gallop.   Pulmonary:     Effort:  Pulmonary effort is normal. No respiratory distress.     Breath sounds: Normal breath sounds. No wheezing, rhonchi or rales.  Chest:     Chest wall: No tenderness or crepitus.  Abdominal:     General: Bowel sounds are normal. There is no distension.     Palpations: Abdomen is soft.     Tenderness: There is no abdominal tenderness. There is no guarding or rebound.  Musculoskeletal: Normal range of motion.        General: No tenderness.     Comments: Moves all extremities well.   Skin:    General: Skin is warm and dry.     Coloration: Skin is not pale.     Findings: No erythema or rash.  Neurological:     General: No focal deficit present.     Mental  Status: She is alert and oriented to person, place, and time.     Cranial Nerves: No cranial nerve deficit.  Psychiatric:        Mood and Affect: Mood normal. Mood is not anxious.        Speech: Speech normal.        Behavior: Behavior normal.        Thought Content: Thought content normal.      ED Treatments / Results  Labs (all labs ordered are listed, but only abnormal results are displayed) Results for orders placed or performed during the hospital encounter of 10/26/19  Lactic acid, plasma  Result Value Ref Range   Lactic Acid, Venous 1.1 0.5 - 1.9 mmol/L  Lactic acid, plasma  Result Value Ref Range   Lactic Acid, Venous 1.0 0.5 - 1.9 mmol/L  CBC WITH DIFFERENTIAL  Result Value Ref Range   WBC 4.3 4.0 - 10.5 K/uL   RBC 5.05 3.87 - 5.11 MIL/uL   Hemoglobin 15.0 12.0 - 15.0 g/dL   HCT 45.7 36.0 - 46.0 %   MCV 90.5 80.0 - 100.0 fL   MCH 29.7 26.0 - 34.0 pg   MCHC 32.8 30.0 - 36.0 g/dL   RDW 14.1 11.5 - 15.5 %   Platelets 153 150 - 400 K/uL   nRBC 0.0 0.0 - 0.2 %   Neutrophils Relative % 71 %   Neutro Abs 3.1 1.7 - 7.7 K/uL   Lymphocytes Relative 22 %   Lymphs Abs 0.9 0.7 - 4.0 K/uL   Monocytes Relative 7 %   Monocytes Absolute 0.3 0.1 - 1.0 K/uL   Eosinophils Relative 0 %   Eosinophils Absolute 0.0 0.0 - 0.5 K/uL   Basophils Relative 0 %   Basophils Absolute 0.0 0.0 - 0.1 K/uL   Immature Granulocytes 0 %   Abs Immature Granulocytes 0.01 0.00 - 0.07 K/uL  Comprehensive metabolic panel  Result Value Ref Range   Sodium 133 (L) 135 - 145 mmol/L   Potassium 3.5 3.5 - 5.1 mmol/L   Chloride 102 98 - 111 mmol/L   CO2 23 22 - 32 mmol/L   Glucose, Bld 123 (H) 70 - 99 mg/dL   BUN 20 8 - 23 mg/dL   Creatinine, Ser 1.11 (H) 0.44 - 1.00 mg/dL   Calcium 8.8 (L) 8.9 - 10.3 mg/dL   Total Protein 7.0 6.5 - 8.1 g/dL   Albumin 3.9 3.5 - 5.0 g/dL   AST 24 15 - 41 U/L   ALT 27 0 - 44 U/L   Alkaline Phosphatase 56 38 - 126 U/L   Total Bilirubin 0.5 0.3 - 1.2 mg/dL  GFR calc  non Af Amer 50 (L) >60 mL/min   GFR calc Af Amer 57 (L) >60 mL/min   Anion gap 8 5 - 15  D-dimer, quantitative  Result Value Ref Range   D-Dimer, Quant 0.31 0.00 - 0.50 ug/mL-FEU  Procalcitonin  Result Value Ref Range   Procalcitonin <0.10 ng/mL  Lactate dehydrogenase  Result Value Ref Range   LDH 170 98 - 192 U/L  Ferritin  Result Value Ref Range   Ferritin 175 11 - 307 ng/mL  Triglycerides  Result Value Ref Range   Triglycerides 65 <150 mg/dL  Fibrinogen  Result Value Ref Range   Fibrinogen 444 210 - 475 mg/dL  C-reactive protein  Result Value Ref Range   CRP 1.7 (H) <1.0 mg/dL   Laboratory interpretation all normal except mildly elevated CRP, renal insufficiency however her creatinine was 1.57 in June 2019   EKG EKG Interpretation  Date/Time:  Saturday October 26 2019 23:52:58 EDT Ventricular Rate:  108 PR Interval:    QRS Duration: 101 QT Interval:  325 QTC Calculation: 436 R Axis:   141 Text Interpretation: Sinus tachycardia Lateral infarct, old Probable anteroseptal infarct, old No significant change since last tracing 01 Nov 2017 Confirmed by Rolland Porter [phone]) on 10/27/2019 12:08:45 AM   Radiology Dg Chest Port 1 View  Result Date: 10/27/2019 CLINICAL DATA:  73 year old female with chest pain and fever. EXAM: PORTABLE CHEST 1 VIEW COMPARISON:  Chest radiograph dated 11/01/2017 FINDINGS: Minimal left lung base atelectatic changes. No focal consolidation, pleural effusion, pneumothorax. Right paratracheal opacity measuring approximately 3.2 x 3.4 cm appears more prominent compared to the prior radiograph. Underlying adenopathy is not excluded. This can be better evaluated with CT on a nonemergent basis. Stable cardiac silhouette. Moderate size hiatal hernia. No acute osseous pathology. Partially visualized lower cervical ACDF. IMPRESSION: 1. No acute cardiopulmonary process. 2. Right paratracheal density, more prominent than prior radiograph. This can be better  evaluated with CT. 3. Moderate size hiatal hernia. Electronically Signed   By: Anner Crete M.D.   On: 10/27/2019 00:50    Procedures Procedures (including critical care time)  Medications Ordered in ED Medications  predniSONE (DELTASONE) tablet 60 mg (has no administration in time range)  sodium chloride 0.9 % bolus 1,000 mL (0 mLs Intravenous Stopped 10/27/19 0225)  sodium chloride 0.9 % bolus 500 mL (0 mLs Intravenous Stopped 10/27/19 0225)     Initial Impression / Assessment and Plan / ED Course  I have reviewed the triage vital signs and the nursing notes.  Pertinent labs & imaging results that were available during my care of the patient were reviewed by me and considered in my medical decision making (see chart for details).      Patient did appear to be dehydrated, although IV fluids are not recommended for Covid patient did appear to be dehydrated and that was her main concern.  She was given IV fluids and testing was done.  Her pulse ox during most of my exam was 98% on room air.  I talked to the patient that that was a good sign.  Patient's tests are basically normal with only a mildly elevated CRP.  She is not hypoxic.  She is not hypotensive.  She will probably be sent home.  Patient's pulse ox was 92 to 95% on room air.  Patient is COVID-19 positive, she is already had a Z-Pak.  She does not have pneumonia on her x-ray although she does need a follow-up CT scan  of her chest to look for something else.  She was discharged home with steroid.  She should monitor her temperature and monitor her oxygen level.  [name] was evaluated in Emergency Department on 10/27/2019 for the symptoms described in the history of present illness. She was evaluated in the context of the global COVID-19 pandemic, which necessitated consideration that the patient might be at risk for infection with the SARS-CoV-2 virus that causes COVID-19. Institutional protocols and algorithms that  pertain to the evaluation of patients at risk for COVID-19 are in a state of rapid change based on information released by regulatory bodies including the CDC and federal and state organizations. These policies and algorithms were followed during the patient's care in the ED.   Final Clinical Impressions(s) / ED Diagnoses   Final diagnoses:  COVID-19  Dehydration    ED Discharge Orders         Ordered    predniSONE (DELTASONE) 20 MG tablet     10/27/19 0350         Plan discharge  Rolland Porter, MD, Barbette Or, MD 10/27/19 [place], [place], MD 10/27/19 [phone]

## 2019-11-01 LAB — CULTURE, BLOOD (ROUTINE X 2)
Culture: NO GROWTH
Culture: NO GROWTH
Special Requests: ADEQUATE

## 2019-11-05 DIAGNOSIS — U071 COVID-19: Secondary | ICD-10-CM | POA: Diagnosis not present

## 2019-11-05 DIAGNOSIS — S2231XD Fracture of one rib, right side, subsequent encounter for fracture with routine healing: Secondary | ICD-10-CM | POA: Diagnosis not present

## 2019-11-05 DIAGNOSIS — Z6832 Body mass index (BMI) 32.0-32.9, adult: Secondary | ICD-10-CM | POA: Diagnosis not present

## 2019-11-05 DIAGNOSIS — E6609 Other obesity due to excess calories: Secondary | ICD-10-CM | POA: Diagnosis not present

## 2019-11-05 DIAGNOSIS — R918 Other nonspecific abnormal finding of lung field: Secondary | ICD-10-CM | POA: Diagnosis not present

## 2019-11-11 DIAGNOSIS — K449 Diaphragmatic hernia without obstruction or gangrene: Secondary | ICD-10-CM | POA: Diagnosis not present

## 2019-11-11 DIAGNOSIS — R918 Other nonspecific abnormal finding of lung field: Secondary | ICD-10-CM | POA: Diagnosis not present

## 2019-11-11 DIAGNOSIS — N281 Cyst of kidney, acquired: Secondary | ICD-10-CM | POA: Diagnosis not present

## 2019-11-11 DIAGNOSIS — I288 Other diseases of pulmonary vessels: Secondary | ICD-10-CM | POA: Diagnosis not present

## 2019-11-11 DIAGNOSIS — K769 Liver disease, unspecified: Secondary | ICD-10-CM | POA: Diagnosis not present

## 2019-11-12 DIAGNOSIS — I2699 Other pulmonary embolism without acute cor pulmonale: Secondary | ICD-10-CM | POA: Diagnosis not present

## 2019-11-12 DIAGNOSIS — U071 COVID-19: Secondary | ICD-10-CM | POA: Diagnosis not present

## 2019-11-12 DIAGNOSIS — Z6832 Body mass index (BMI) 32.0-32.9, adult: Secondary | ICD-10-CM | POA: Diagnosis not present

## 2019-11-12 DIAGNOSIS — E6609 Other obesity due to excess calories: Secondary | ICD-10-CM | POA: Diagnosis not present

## 2019-12-26 DIAGNOSIS — I10 Essential (primary) hypertension: Secondary | ICD-10-CM | POA: Diagnosis not present

## 2019-12-26 DIAGNOSIS — E1165 Type 2 diabetes mellitus with hyperglycemia: Secondary | ICD-10-CM | POA: Diagnosis not present

## 2019-12-30 ENCOUNTER — Other Ambulatory Visit (INDEPENDENT_AMBULATORY_CARE_PROVIDER_SITE_OTHER): Payer: Self-pay | Admitting: Internal Medicine

## 2020-01-08 ENCOUNTER — Encounter: Payer: Self-pay | Admitting: Cardiology

## 2020-01-08 ENCOUNTER — Ambulatory Visit (INDEPENDENT_AMBULATORY_CARE_PROVIDER_SITE_OTHER): Payer: Medicare Other | Admitting: Cardiology

## 2020-01-08 VITALS — BP 153/80 | HR 84 | Temp 97.5°F | Ht 64.0 in | Wt 182.8 lb

## 2020-01-08 DIAGNOSIS — Z87898 Personal history of other specified conditions: Secondary | ICD-10-CM | POA: Diagnosis not present

## 2020-01-08 DIAGNOSIS — Z86711 Personal history of pulmonary embolism: Secondary | ICD-10-CM | POA: Diagnosis not present

## 2020-01-08 DIAGNOSIS — I2699 Other pulmonary embolism without acute cor pulmonale: Secondary | ICD-10-CM | POA: Diagnosis not present

## 2020-01-08 DIAGNOSIS — Z8619 Personal history of other infectious and parasitic diseases: Secondary | ICD-10-CM | POA: Diagnosis not present

## 2020-01-08 DIAGNOSIS — K219 Gastro-esophageal reflux disease without esophagitis: Secondary | ICD-10-CM

## 2020-01-08 NOTE — Progress Notes (Signed)
Cardiology Office Note  Date: 01/08/2020   ID: Emogene, Schmuhl 1946/06/24, MRN [id number]  PCP:  Sharilyn Sites, MD  Evaluating Cardiologist:  Satira Sark, MD Electrophysiologist:  None   Chief Complaint  Patient presents with  . Cardiac follow-up    History of Present Illness: JAMILETT FREEHILL is a 74 y.o. female last seen in November 2018 by Ms. West Pugh.  She has a history of chest pain with previous reassuring ischemic evaluation in 2018. She presents today for a routine visit. She does not report any reproducible, exertional chest pain to suggest angina. She does tell me that she was diagnosed with COVID-19 in early November 2020, subsequently a pulmonary embolus which is being managed by Dr. Hilma Favors. She is on Xarelto at this time and anticipates at least a 25-month course. She tells me that she has a follow-up chest CT pending after 6 months of treatment.  She follows with Dr. Laural Golden, was seen in May 2020 with history of chronic GERD, large hiatal hernia, and IBS.  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 She states that she is usually alone, not around big crowds. She does wear a mask when she goes out.  Past Medical History:  Diagnosis Date  . Arthritis   . Asthma   . Carpal tunnel syndrome   . Chronic anxiety   . Essential hypertension   . Gastric ulcer   . GERD (gastroesophageal reflux disease)   . H/O hiatal hernia   . History of heart murmur in childhood   . Iron deficiency anemia   . Neuropathy    Following neck surgery.  . Pulmonary embolus [facility])    November 2020  . Type 2 diabetes mellitus ([place])     Past Surgical History:  Procedure Laterality Date  . APPENDECTOMY    . CHOLECYSTECTOMY    . COLONOSCOPY    . COLONOSCOPY N/A 09/26/2013   Procedure: COLONOSCOPY;  Surgeon: Rogene Houston, MD;  [facility];  Service: Endoscopy;  Laterality: N/A;  1030-rescheduled to 12:00pm Ann notified pt    . COLONOSCOPY N/A 03/27/2015   Procedure: COLONOSCOPY;  Surgeon: Rogene Houston, MD;  [facility];  Service: Endoscopy;  Laterality: N/A;  11:15 - moved to 8:25 - Ann to notify pt  . ESOPHAGOGASTRODUODENOSCOPY  12/08/2011   Procedure: ESOPHAGOGASTRODUODENOSCOPY (EGD);  Surgeon: Rogene Houston, MD;  [facility];  Service: Endoscopy;  Laterality: N/A;  3:00   . ESOPHAGOGASTRODUODENOSCOPY N/A 03/27/2015   Procedure: ESOPHAGOGASTRODUODENOSCOPY (EGD);  Surgeon: Rogene Houston, MD;  [facility];  Service: Endoscopy;  Laterality: N/A;  . EYE SURGERY Bilateral    cataract surgery  . GIVENS CAPSULE STUDY N/A 11/04/2015   Procedure: GIVENS CAPSULE STUDY;  Surgeon: Rogene Houston, MD;  [facility];  Service: Endoscopy;  Laterality: N/A;  . Hysterscopy    . KNEE ARTHROSCOPY     Left  . NECK SURGERY     x 2   . TONSILLECTOMY    . TOTAL KNEE ARTHROPLASTY  07/11/2012   Procedure: TOTAL KNEE ARTHROPLASTY;  Surgeon: Ninetta Lights, MD;  Location: [place];  Service: Orthopedics;  Laterality: Left;  left total knee arthroplasty  . UPPER GASTROINTESTINAL ENDOSCOPY    . YAG LASER APPLICATION Right [id number]   Procedure: YAG LASER APPLICATION;  Surgeon: Rutherford Guys, MD;  Location: [facility] ORS;  Service: Ophthalmology;  Laterality: Right;  Current Outpatient Medications  Medication Sig Dispense Refill  . acetaminophen (TYLENOL) 500 MG tablet Take 500 mg by mouth as needed.    . Cholecalciferol (VITAMIN D) 2000 UNITS CAPS Take 1 capsule by mouth daily.      . diazepam (VALIUM) 5 MG tablet Take 5 mg by mouth every 8 (eight) hours as needed for anxiety.     . famotidine (PEPCID) 20 MG tablet Take 1 tablet (20 mg total) by mouth at bedtime as needed for heartburn or indigestion.    . ferrous sulfate 325 (65 FE) MG tablet Take 1 tablet (325 mg total) by mouth daily with breakfast.    . gabapentin (NEURONTIN) 300 MG capsule Take 300 mg by mouth at bedtime.     .  hydrochlorothiazide (HYDRODIURIL) 25 MG tablet Take 12.5 mg by mouth daily.    . hyoscyamine (LEVSIN SL) 0.125 MG SL tablet DISSOLVE 1 TABLET(0.125 MG) UNDER THE TONGUE EVERY 6 HOURS AS NEEDED 360 tablet 1  . ibuprofen (ADVIL) 400 MG tablet Take 1 tablet (400 mg total) by mouth as needed for moderate pain (ostoarthritis). 1-2 tables twice daily as needed. Patient takes for Arthritis. 60 tablet 0  . metFORMIN (GLUCOPHAGE) 500 MG tablet Take 500 mg by mouth 2 (two) times daily with a meal.     . montelukast (SINGULAIR) 10 MG tablet Take 10 mg by mouth every morning.     Marland Kitchen omeprazole (PRILOSEC) 20 MG capsule TAKE 1 CAPSULE(20 MG) BY MOUTH DAILY BEFORE BREAKFAST 90 capsule 3  . potassium chloride (KLOR-CON) 10 MEQ tablet TAKE 1 TABLET(10 MEQ) BY MOUTH DAILY 30 tablet 5  . predniSONE (DELTASONE) 20 MG tablet Take 3 po QD x 3d , then 2 po QD x 3d then 1 po QD x 3d 18 tablet 0  . rivaroxaban (XARELTO) 20 MG TABS tablet Take 20 mg by mouth daily with supper.    . TURMERIC PO Take 1 tablet by mouth daily.     No current facility-administered medications for this visit.   Allergies:  Nsaids, Cephalexin, Darvocet [propoxyphene n-acetaminophen], Percocet [oxycodone-acetaminophen], and Penicillins   Social History: The patient  reports that she has never smoked. She has never used smokeless tobacco. She reports that she does not drink alcohol or use drugs.   ROS:  Please see the history of present illness. Otherwise, complete review of systems is positive for none.  All other systems are reviewed and negative.   Physical Exam: VS:  BP (!) 153/80   Pulse 84   Temp (!) 97.5 F (36.4 C)   Ht 5\' 4"  (1.626 m)   Wt 182 lb 12.8 oz (82.9 kg)   SpO2 98%   BMI 31.38 kg/m , BMI Body mass index is 31.38 kg/m.  Wt Readings from Last 3 Encounters:  01/08/20 182 lb 12.8 oz (82.9 kg)  10/26/19 180 lb (81.6 kg)  10/30/18 184 lb 9.6 oz (83.7 kg)    General: Patient appears comfortable at rest. HEENT:  Conjunctiva and lids normal, wearing a mask. Neck: Supple, no elevated JVP or carotid bruits, no thyromegaly. Lungs: Decreased breath sounds, nonlabored breathing at rest. Cardiac: Regular rate and rhythm, no S3, 2/6 systolic murmur. Abdomen: Soft, nontender, bowel sounds present. Extremities: No pitting edema, distal pulses 2+. Skin: Warm and dry. Musculoskeletal: No kyphosis. Neuropsychiatric: Alert and oriented x3, affect grossly appropriate.  ECG:  An ECG dated 10/26/2019 was personally reviewed today and demonstrated:  Sinus tachycardia, rule out old high lateral infarct pattern.  Recent  Labwork: 10/27/2019: ALT 27; AST 24; BUN 20; Creatinine, Ser 1.11; Hemoglobin 15.0; Platelets 153; Potassium 3.5; Sodium 133     Component Value Date/Time   CHOL 113 11/03/2017 0515   TRIG 65 10/27/2019 0010   HDL 59 11/03/2017 0515   CHOLHDL 1.9 11/03/2017 0515   VLDL 9 11/03/2017 0515   LDLCALC 45 11/03/2017 0515    Other Studies Reviewed Today:  Lexiscan Myoview 11/03/2017:  No diagnostic ST segment changes to indicate ischemia.  Small, mild intensity, partially reversible mid anteroseptal defect that is most consistent with variable breast tissue attenuation.  Small, mild intensity, fixed apical inferolateral defect that is most consistent with soft tissue attenuation.  This is a low risk study.  Nuclear stress EF: 76%.  Echocardiogram 11/02/2017: Study Conclusions  - Left ventricle: The cavity size was normal. Wall thickness was   normal. Systolic function was vigorous. The estimated ejection   fraction was in the range of 65% to 70%. Wall motion was normal;   there were no regional wall motion abnormalities. Doppler   parameters are consistent with abnormal left ventricular   relaxation (grade 1 diastolic dysfunction). - Aortic valve: Mildly calcified annulus. Trileaflet. - Mitral valve: There was mild regurgitation. - Right atrium: Central venous pressure (est): 3 mm Hg. -  Atrial septum: No defect or patent foramen ovale was identified. - Tricuspid valve: There was mild regurgitation. - Pulmonary arteries: PA peak pressure: 28 mm Hg (S). - Pericardium, extracardiac: There was no pericardial effusion.  Impressions:  - Normal LV wall thickness with LVEF 60-65% and grade 1 diastolic   dysfunction. Mild mitral regurgitation and tricuspid   regurgitation. PASP estimated 28 mmHg.  Assessment and Plan:  1. Previous history of chest pain, no recurrence with activity to suggest angina. She underwent a low risk ischemic work-up in 2018. Recommend observation and basic risk factor modification. She continues to follow with Dr. Hilma Favors.  2. Reported diagnosis of COVID-19 in November 2020 and subsequently pulmonary embolus. She is on Xarelto on the direction of Dr. Hilma Favors and anticipates at least a 8-month course.  3. Reflux and large hiatal hernia as well as IBS. She continues to follow with Dr. Laural Golden. Medical therapy reviewed above.  Medication Adjustments/Labs and Tests Ordered: Current medicines are reviewed at length with the patient today.  Concerns regarding medicines are outlined above.   Tests Ordered: No orders of the defined types were placed in this encounter.   Medication Changes: No orders of the defined types were placed in this encounter.   Disposition:  Follow up 1 year in the [place] office.  Signed, Satira Sark, MD, [facility] 01/08/2020 3:49 PM    [place] [facility] [address], [place], [place] [address] Phone: [phone]; Fax: [phone]

## 2020-01-08 NOTE — Patient Instructions (Signed)
Medication Instructions:  Your physician recommends that you continue on your current medications as directed. Please refer to the Current Medication list given to you today.  *If you need a refill on your cardiac medications before your next appointment, please call your pharmacy*  Lab Work: NONE If you have labs (blood work) drawn today and your tests are completely normal, you will receive your results only by: . MyChart Message (if you have MyChart) OR . A paper copy in the mail If you have any lab test that is abnormal or we need to change your treatment, we will call you to review the results.  Testing/Procedures: NONE  Follow-Up: At [facility], you and your health needs are our priority.  As part of our continuing mission to provide you with exceptional heart care, we have created designated Provider Care Teams.  These Care Teams include your primary Cardiologist (physician) and Advanced Practice Providers (APPs -  Physician Assistants and Nurse Practitioners) who all work together to provide you with the care you need, when you need it.  Your next appointment:   12 month(s)  The format for your next appointment:   In Person  Provider:   Samuel McDowell, MD  Other Instructions NONE     Thank you for choosing [facility] !         

## 2020-01-10 DIAGNOSIS — Z23 Encounter for immunization: Secondary | ICD-10-CM | POA: Diagnosis not present

## 2020-01-16 DIAGNOSIS — Z6833 Body mass index (BMI) 33.0-33.9, adult: Secondary | ICD-10-CM | POA: Diagnosis not present

## 2020-01-16 DIAGNOSIS — E119 Type 2 diabetes mellitus without complications: Secondary | ICD-10-CM | POA: Diagnosis not present

## 2020-01-16 DIAGNOSIS — I10 Essential (primary) hypertension: Secondary | ICD-10-CM | POA: Diagnosis not present

## 2020-01-16 DIAGNOSIS — E6609 Other obesity due to excess calories: Secondary | ICD-10-CM | POA: Diagnosis not present

## 2020-01-16 DIAGNOSIS — J4530 Mild persistent asthma, uncomplicated: Secondary | ICD-10-CM | POA: Diagnosis not present

## 2020-01-16 DIAGNOSIS — E559 Vitamin D deficiency, unspecified: Secondary | ICD-10-CM | POA: Diagnosis not present

## 2020-01-16 DIAGNOSIS — I2699 Other pulmonary embolism without acute cor pulmonale: Secondary | ICD-10-CM | POA: Diagnosis not present

## 2020-01-26 DIAGNOSIS — I10 Essential (primary) hypertension: Secondary | ICD-10-CM | POA: Diagnosis not present

## 2020-01-26 DIAGNOSIS — E1165 Type 2 diabetes mellitus with hyperglycemia: Secondary | ICD-10-CM | POA: Diagnosis not present

## 2020-02-06 ENCOUNTER — Ambulatory Visit (HOSPITAL_COMMUNITY)
Admission: RE | Admit: 2020-02-06 | Discharge: 2020-02-06 | Disposition: A | Discharge status: Home / Self Care | Payer: Medicare Other | Source: Ambulatory Visit | Attending: Family Medicine | Admitting: Family Medicine

## 2020-02-06 ENCOUNTER — Other Ambulatory Visit: Payer: Self-pay

## 2020-02-06 DIAGNOSIS — E2839 Other primary ovarian failure: Secondary | ICD-10-CM

## 2020-02-06 DIAGNOSIS — M810 Age-related osteoporosis without current pathological fracture: Secondary | ICD-10-CM | POA: Diagnosis not present

## 2020-02-23 DIAGNOSIS — I10 Essential (primary) hypertension: Secondary | ICD-10-CM | POA: Diagnosis not present

## 2020-02-23 DIAGNOSIS — E1165 Type 2 diabetes mellitus with hyperglycemia: Secondary | ICD-10-CM | POA: Diagnosis not present

## 2020-03-25 DIAGNOSIS — E1165 Type 2 diabetes mellitus with hyperglycemia: Secondary | ICD-10-CM | POA: Diagnosis not present

## 2020-03-25 DIAGNOSIS — I10 Essential (primary) hypertension: Secondary | ICD-10-CM | POA: Diagnosis not present

## 2020-04-02 DIAGNOSIS — L570 Actinic keratosis: Secondary | ICD-10-CM | POA: Diagnosis not present

## 2020-04-02 DIAGNOSIS — X32XXXD Exposure to sunlight, subsequent encounter: Secondary | ICD-10-CM | POA: Diagnosis not present

## 2020-04-20 DIAGNOSIS — E119 Type 2 diabetes mellitus without complications: Secondary | ICD-10-CM | POA: Diagnosis not present

## 2020-04-20 DIAGNOSIS — E6609 Other obesity due to excess calories: Secondary | ICD-10-CM | POA: Diagnosis not present

## 2020-04-20 DIAGNOSIS — Z6833 Body mass index (BMI) 33.0-33.9, adult: Secondary | ICD-10-CM | POA: Diagnosis not present

## 2020-04-20 DIAGNOSIS — I10 Essential (primary) hypertension: Secondary | ICD-10-CM | POA: Diagnosis not present

## 2020-04-24 DIAGNOSIS — I10 Essential (primary) hypertension: Secondary | ICD-10-CM | POA: Diagnosis not present

## 2020-04-24 DIAGNOSIS — E1165 Type 2 diabetes mellitus with hyperglycemia: Secondary | ICD-10-CM | POA: Diagnosis not present

## 2020-04-29 DIAGNOSIS — E119 Type 2 diabetes mellitus without complications: Secondary | ICD-10-CM | POA: Diagnosis not present

## 2020-04-29 DIAGNOSIS — Z1389 Encounter for screening for other disorder: Secondary | ICD-10-CM | POA: Diagnosis not present

## 2020-04-29 DIAGNOSIS — Z6833 Body mass index (BMI) 33.0-33.9, adult: Secondary | ICD-10-CM | POA: Diagnosis not present

## 2020-04-29 DIAGNOSIS — N281 Cyst of kidney, acquired: Secondary | ICD-10-CM | POA: Diagnosis not present

## 2020-04-29 DIAGNOSIS — R918 Other nonspecific abnormal finding of lung field: Secondary | ICD-10-CM | POA: Diagnosis not present

## 2020-04-29 DIAGNOSIS — E6609 Other obesity due to excess calories: Secondary | ICD-10-CM | POA: Diagnosis not present

## 2020-04-29 DIAGNOSIS — N200 Calculus of kidney: Secondary | ICD-10-CM | POA: Diagnosis not present

## 2020-06-24 DIAGNOSIS — I10 Essential (primary) hypertension: Secondary | ICD-10-CM | POA: Diagnosis not present

## 2020-06-24 DIAGNOSIS — E1165 Type 2 diabetes mellitus with hyperglycemia: Secondary | ICD-10-CM | POA: Diagnosis not present

## 2020-06-26 ENCOUNTER — Other Ambulatory Visit (INDEPENDENT_AMBULATORY_CARE_PROVIDER_SITE_OTHER): Payer: Self-pay | Admitting: Internal Medicine

## 2020-06-26 ENCOUNTER — Other Ambulatory Visit (INDEPENDENT_AMBULATORY_CARE_PROVIDER_SITE_OTHER): Payer: Self-pay | Admitting: Gastroenterology

## 2020-07-08 ENCOUNTER — Emergency Department (HOSPITAL_COMMUNITY)
Admission: EM | Admit: 2020-07-08 | Discharge: 2020-07-09 | Disposition: A | Discharge status: Home / Self Care | Payer: Medicare Other | Attending: Emergency Medicine | Admitting: Emergency Medicine

## 2020-07-08 ENCOUNTER — Encounter (HOSPITAL_COMMUNITY): Payer: Self-pay | Admitting: Emergency Medicine

## 2020-07-08 ENCOUNTER — Other Ambulatory Visit: Payer: Self-pay

## 2020-07-08 DIAGNOSIS — S0990XA Unspecified injury of head, initial encounter: Secondary | ICD-10-CM | POA: Diagnosis not present

## 2020-07-08 DIAGNOSIS — W010XXA Fall on same level from slipping, tripping and stumbling without subsequent striking against object, initial encounter: Secondary | ICD-10-CM | POA: Insufficient documentation

## 2020-07-08 DIAGNOSIS — I10 Essential (primary) hypertension: Secondary | ICD-10-CM | POA: Insufficient documentation

## 2020-07-08 DIAGNOSIS — Z7984 Long term (current) use of oral hypoglycemic drugs: Secondary | ICD-10-CM | POA: Insufficient documentation

## 2020-07-08 DIAGNOSIS — Z7901 Long term (current) use of anticoagulants: Secondary | ICD-10-CM | POA: Insufficient documentation

## 2020-07-08 DIAGNOSIS — Y929 Unspecified place or not applicable: Secondary | ICD-10-CM | POA: Diagnosis not present

## 2020-07-08 DIAGNOSIS — Y999 Unspecified external cause status: Secondary | ICD-10-CM | POA: Diagnosis not present

## 2020-07-08 DIAGNOSIS — Z79899 Other long term (current) drug therapy: Secondary | ICD-10-CM | POA: Insufficient documentation

## 2020-07-08 DIAGNOSIS — W19XXXA Unspecified fall, initial encounter: Secondary | ICD-10-CM | POA: Diagnosis not present

## 2020-07-08 DIAGNOSIS — R42 Dizziness and giddiness: Secondary | ICD-10-CM | POA: Diagnosis not present

## 2020-07-08 DIAGNOSIS — J45909 Unspecified asthma, uncomplicated: Secondary | ICD-10-CM | POA: Diagnosis not present

## 2020-07-08 DIAGNOSIS — S199XXA Unspecified injury of neck, initial encounter: Secondary | ICD-10-CM | POA: Diagnosis not present

## 2020-07-08 DIAGNOSIS — S0003XA Contusion of scalp, initial encounter: Secondary | ICD-10-CM | POA: Diagnosis not present

## 2020-07-08 DIAGNOSIS — E119 Type 2 diabetes mellitus without complications: Secondary | ICD-10-CM | POA: Insufficient documentation

## 2020-07-08 DIAGNOSIS — Y939 Activity, unspecified: Secondary | ICD-10-CM | POA: Diagnosis not present

## 2020-07-08 DIAGNOSIS — S9032XA Contusion of left foot, initial encounter: Secondary | ICD-10-CM | POA: Diagnosis not present

## 2020-07-08 DIAGNOSIS — S99922A Unspecified injury of left foot, initial encounter: Secondary | ICD-10-CM | POA: Diagnosis not present

## 2020-07-08 NOTE — ED Triage Notes (Addendum)
Pt tripped over her shoe and fell on her buttocks then fell back and hit her head. Pt c/o head pain. Pt denies any loc. Her fall was witnessed by son. Pt also c/o left foot pain and pain to buttocks.

## 2020-07-09 ENCOUNTER — Emergency Department (HOSPITAL_COMMUNITY): Payer: Medicare Other

## 2020-07-09 DIAGNOSIS — S99922A Unspecified injury of left foot, initial encounter: Secondary | ICD-10-CM | POA: Diagnosis not present

## 2020-07-09 DIAGNOSIS — S199XXA Unspecified injury of neck, initial encounter: Secondary | ICD-10-CM | POA: Diagnosis not present

## 2020-07-09 DIAGNOSIS — S0003XA Contusion of scalp, initial encounter: Secondary | ICD-10-CM | POA: Diagnosis not present

## 2020-07-09 DIAGNOSIS — S0990XA Unspecified injury of head, initial encounter: Secondary | ICD-10-CM | POA: Diagnosis not present

## 2020-07-09 NOTE — ED Provider Notes (Signed)
[facility] EMERGENCY DEPARTMENT Provider Note   CSN: [id number] Arrival date & time: 07/08/20  2300   History Chief Complaint  Patient presents with  . Fall    [name] is a 74 y.o. female.  The history is provided by the patient.  Fall  She tripped and fell hitting the back of her head and also injuring her left foot.  She denies loss of consciousness but states she was initially dazed.  She is anticoagulated on rivaroxaban because of Covid induced pulmonary embolism.  She denies other injury.  Past Medical History:  Diagnosis Date  . Arthritis   . Asthma   . Carpal tunnel syndrome   . Chronic anxiety   . Essential hypertension   . Gastric ulcer   . GERD (gastroesophageal reflux disease)   . H/O hiatal hernia   . History of heart murmur in childhood   . Iron deficiency anemia   . Neuropathy    Following neck surgery.  . Pulmonary embolus [facility])    November 2020  . Type 2 diabetes mellitus [facility])     Patient Active Problem List   Diagnosis Date Noted  . Well woman exam with routine gynecological exam 11/22/2017  . Controlled type 2 diabetes mellitus ([place]) 11/02/2017  . Nonspecific chest pain 11/02/2017  . Bilateral leg edema   . Diabetes ([place]) 03/28/2016  . Pain in the chest   . GERD (gastroesophageal reflux disease)   . Chest pain 01/05/2015  . Neuropathy 01/05/2015  . Bronchitis   . 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 Chest pain at rest 11/14/2013  . Left knee pain 09/18/2013  . Salmonella 05/24/2013  . Thrombocytopenia, unspecified ([place]) 05/24/2013  . Acute gastroenteritis 05/21/2013  . Fever 05/21/2013  . Abnormal urinalysis 05/21/2013  . Hypokalemia 05/21/2013  . Dehydration 05/21/2013  . Coccydynia 05/07/2013  . Anemia 08/06/2012  . PUD (peptic ulcer disease) 02/06/2012  . OVERWEIGHT/OBESITY 12/21/2010  . Essential hypertension 12/29/2009  . Asthma 12/29/2009  . GERD 12/29/2009  . HIATAL HERNIA 12/29/2009  . HEART  MURMUR, SYSTOLIC 41/28/7867    Past Surgical History:  Procedure Laterality Date  . APPENDECTOMY    . CHOLECYSTECTOMY    . COLONOSCOPY    . COLONOSCOPY N/A 09/26/2013   Procedure: COLONOSCOPY;  Surgeon: Rogene Houston, MD;  [facility];  Service: Endoscopy;  Laterality: N/A;  1030-rescheduled to 12:00pm Ann notified pt  . COLONOSCOPY N/A 03/27/2015   Procedure: COLONOSCOPY;  Surgeon: Rogene Houston, MD;  [facility];  Service: Endoscopy;  Laterality: N/A;  11:15 - moved to 8:25 - Ann to notify pt  . ESOPHAGOGASTRODUODENOSCOPY  12/08/2011   Procedure: ESOPHAGOGASTRODUODENOSCOPY (EGD);  Surgeon: Rogene Houston, MD;  [facility];  Service: Endoscopy;  Laterality: N/A;  3:00   . ESOPHAGOGASTRODUODENOSCOPY N/A 03/27/2015   Procedure: ESOPHAGOGASTRODUODENOSCOPY (EGD);  Surgeon: Rogene Houston, MD;  [facility];  Service: Endoscopy;  Laterality: N/A;  . EYE SURGERY Bilateral    cataract surgery  . GIVENS CAPSULE STUDY N/A 11/04/2015   Procedure: GIVENS CAPSULE STUDY;  Surgeon: Rogene Houston, MD;  [facility];  Service: Endoscopy;  Laterality: N/A;  . Hysterscopy    . KNEE ARTHROSCOPY     Left  . NECK SURGERY     x 2   . TONSILLECTOMY    . TOTAL KNEE ARTHROPLASTY  07/11/2012   Procedure: TOTAL KNEE ARTHROPLASTY;  Surgeon: Nestor Ramp  Percell Miller, MD;  Location: [place];  Service: Orthopedics;  Laterality: Left;  left total knee arthroplasty  . UPPER GASTROINTESTINAL ENDOSCOPY    . YAG LASER APPLICATION Right 06/13/5092   Procedure: YAG LASER APPLICATION;  Surgeon: Rutherford Guys, MD;  Location: [facility];  Service: Ophthalmology;  Laterality: Right;     OB History    Gravida  1   Para      Term      Preterm      AB      Living  1     SAB      TAB      Ectopic      Multiple      Live Births              Family History  Problem Relation Age of Onset  . Hypertension Son   . Alzheimer's disease Mother   . Kidney disease  Mother   . Kidney disease Father   . Cancer Maternal Aunt        leukemia  . Liver disease Maternal Uncle   . Kidney disease Paternal Uncle        kidney removed  . Diabetes Maternal Grandmother   . Diabetes Maternal Grandfather   . Cancer Paternal Grandmother        breast, liver  . Colon cancer Neg Hx     Social History   Tobacco Use  . Smoking status: Never Smoker  . Smokeless tobacco: Never Used  Vaping Use  . Vaping Use: Never used  Substance Use Topics  . Alcohol use: No    Alcohol/week: 0.0 standard drinks  . Drug use: No    Home Medications Prior to Admission medications   Medication Sig Start Date End Date Taking? Authorizing Provider  acetaminophen (TYLENOL) 500 MG tablet Take 500 mg by mouth as needed.    [provider]  Cholecalciferol (VITAMIN D) 2000 UNITS CAPS Take 1 capsule by mouth daily.      [provider]  diazepam (VALIUM) 5 MG tablet Take 5 mg by mouth every 8 (eight) hours as needed for anxiety.     [provider]  famotidine (PEPCID) 20 MG tablet Take 1 tablet (20 mg total) by mouth at bedtime as needed for heartburn or indigestion. 10/30/18   Rogene Houston, MD  ferrous sulfate 325 (65 FE) MG tablet Take 1 tablet (325 mg total) by mouth daily with breakfast. 10/25/16   Rehman, Mechele Dawley, MD  gabapentin (NEURONTIN) 300 MG capsule Take 300 mg by mouth at bedtime.     [provider]  hydrochlorothiazide (HYDRODIURIL) 25 MG tablet Take 12.5 mg by mouth daily.    [provider]  hyoscyamine (LEVSIN SL) 0.125 MG SL tablet DISSOLVE 1 TABLET(0.125 MG) UNDER THE TONGUE EVERY 6 HOURS AS NEEDED 10/31/18   Setzer, Rona Ravens, NP  ibuprofen (ADVIL) 400 MG tablet Take 1 tablet (400 mg total) by mouth as needed for moderate pain (ostoarthritis). 1-2 tables twice daily as needed. Patient takes for Arthritis. 04/30/19   Rehman, Mechele Dawley, MD  metFORMIN (GLUCOPHAGE) 500 MG tablet Take 500 mg by mouth 2 (two) times daily with a  meal.     [provider]  montelukast (SINGULAIR) 10 MG tablet Take 10 mg by mouth every morning.     [provider]  omeprazole (PRILOSEC) 20 MG capsule TAKE 1 CAPSULE(20 MG) BY MOUTH DAILY BEFORE BREAKFAST 10/28/19   Rehman, Mechele Dawley, MD  potassium  chloride (KLOR-CON) 10 MEQ tablet TAKE 1 TABLET(10 MEQ) BY MOUTH DAILY 06/26/20   Laurine Blazer A, PA-C  predniSONE (DELTASONE) 20 MG tablet Take 3 po QD x 3d , then 2 po QD x 3d then 1 po QD x 3d 10/27/19   Rolland Porter, MD  rivaroxaban (XARELTO) 20 MG TABS tablet Take 20 mg by mouth daily with supper.    [provider]  TURMERIC PO Take 1 tablet by mouth daily.    [provider]    Allergies    Nsaids, Cephalexin, Darvocet [propoxyphene n-acetaminophen], Percocet [oxycodone-acetaminophen], and Penicillins  Review of Systems   Review of Systems  All other systems reviewed and are negative.   Physical Exam Updated Vital Signs BP (!) 151/72   Pulse 70   Temp 98 F (36.7 C) (Oral)   Resp 17   Ht 5\' 4"  (1.626 m)   Wt 83.9 kg   SpO2 100%   BMI 31.76 kg/m   Physical Exam Vitals and nursing note reviewed.   74 year old female, resting comfortably and in no acute distress. Vital signs are significant for mildly elevated blood pressure. Oxygen saturation is 100%, which is normal. Head is normocephalic and atraumatic. PERRLA, EOMI. Oropharynx is clear. Neck is nontender and supple without adenopathy or JVD. Back is nontender and there is no CVA tenderness. Lungs are clear without rales, wheezes, or rhonchi. Chest is nontender. Heart has regular rate and rhythm without murmur. Abdomen is soft, flat, nontender without masses or hepatosplenomegaly and peristalsis is normoactive. Extremities: There is slight soft tissue swelling and moderate tenderness over the lateral aspect of the left midfoot.  No deformity is seen.  There is full range of motion of all joints without pain. Skin is warm and dry without  rash. Neurologic: Mental status is normal, cranial nerves are intact, there are no motor or sensory deficits.  ED Results / Procedures / Treatments    Radiology CT Head Wo Contrast  Result Date: 07/09/2020 CLINICAL DATA:  74 year old female tripped and fell striking head. EXAM: CT HEAD WITHOUT CONTRAST TECHNIQUE: Contiguous axial images were obtained from the base of the skull through the vertex without intravenous contrast. COMPARISON:  Brain MRI 03/13/2009. FINDINGS: Brain: Cerebral volume remains normal for age. Gray-white matter differentiation is within normal limits for age. No midline shift, ventriculomegaly, mass effect, evidence of mass lesion, intracranial hemorrhage or evidence of cortically based acute infarction. Vascular: No suspicious intracranial vascular hyperdensity. Skull: No fracture identified. Sinuses/Orbits: Visualized paranasal sinuses and mastoids are clear. Other: No orbit or scalp soft tissue injury identified. IMPRESSION: No acute traumatic injury identified. Normal for age non contrast CT appearance of the brain. Electronically Signed   By: Genevie Ann M.D.   On: 07/09/2020 04:07   CT Cervical Spine Wo Contrast  Result Date: 07/09/2020 CLINICAL DATA:  74 year old female tripped and fell striking head. EXAM: CT CERVICAL SPINE WITHOUT CONTRAST TECHNIQUE: Multidetector CT imaging of the cervical spine was performed without intravenous contrast. Multiplanar CT image reconstructions were also generated. COMPARISON:  Head CT today reported separately. Cervical spine MRI 02/16/2018. FINDINGS: Alignment: Stable cervical lordosis. Cervicothoracic junction alignment is within normal limits. Bilateral posterior element alignment is within normal limits. Skull base and vertebrae: Visualized skull base is intact. No atlanto-occipital dissociation. Postoperative details below. No acute osseous abnormality identified. Soft tissues and spinal canal: No prevertebral fluid or swelling. No visible  canal hematoma. Negative noncontrast visible neck soft tissues. Disc levels: Prior anterior fusion with hardware from  the C4 through C7 levels. Solid C4-C5 and C5-C6 arthrodesis. Solid C6-C7 arthrodesis. No evidence of hardware loosening. Moderate to severe adjacent segment facet degeneration at C3-C4. Facet, and endplate degenerative spurring at the adjacent C7-T1 segment. Upper chest: Visible upper thoracic levels appear intact. Negative lung apices. IMPRESSION: 1. No acute traumatic injury identified in the cervical spine. 2. Prior C4 through C7 anterior fusion with solid arthrodesis. Adjacent segment disease at C3-C4 and C7-T1. Electronically Signed   By: Genevie Ann M.D.   On: 07/09/2020 04:10   DG Foot Complete Left  Result Date: 07/09/2020 CLINICAL DATA:  Fall with foot injury on the left. EXAM: LEFT FOOT - COMPLETE 3+ VIEW COMPARISON:  None. FINDINGS: There is no evidence of fracture or dislocation. Small heel spur. Generalized osteopenia. IMPRESSION: Negative for fracture. Electronically Signed   By: Monte Fantasia M.D.   On: 07/09/2020 04:09    Procedures Procedures   Medications Ordered in ED Medications - No data to display  ED Course  I have reviewed the triage vital signs and the nursing notes.  Pertinent imaging results that were available during my care of the patient were reviewed by me and considered in my medical decision making (see chart for details).  MDM Rules/Calculators/A&P Fall with minor head injury and injury to left foot and patient who is on systemic anticoagulation.  She will be sent for CT of head and cervical spine, plain x-rays of her left foot.  Old records are reviewed confirming COVID-19 diagnosis last October.  CT scan showed no acute injury.  Foot x-ray is negative for fracture.  She has been able to ambulate in the ED, does have a walker at home.  She is discharged with instructions to apply ice to sore areas, take acetaminophen as needed for pain.  Advised  of risk of delayed bleeding in the brain in a patient with chronic anticoagulation.  Advised to return if any new or concerning symptoms occur.  Final Clinical Impression(s) / ED Diagnoses Final diagnoses:  Fall from slip, trip, or stumble, initial encounter  Contusion of scalp, initial encounter  Contusion of left foot, initial encounter  Chronic anticoagulation    Rx / DC Orders ED Discharge Orders    None       Delora Fuel, MD 00/34/91 0451

## 2020-07-09 NOTE — Discharge Instructions (Addendum)
Apply ice to sore areas as needed.  You may do this with for 30 minutes at a time, 4 times a day.  Take acetaminophen as needed for pain.  Although your CAT scan did not show any sign of bleeding in the brain, people who are on blood thinners sometimes have delayed bleeding.  If you have any new symptoms that are concerning, please return to the emergency department for further evaluation.

## 2020-07-22 DIAGNOSIS — M150 Primary generalized (osteo)arthritis: Secondary | ICD-10-CM | POA: Diagnosis not present

## 2020-07-22 DIAGNOSIS — E6609 Other obesity due to excess calories: Secondary | ICD-10-CM | POA: Diagnosis not present

## 2020-07-22 DIAGNOSIS — Z6833 Body mass index (BMI) 33.0-33.9, adult: Secondary | ICD-10-CM | POA: Diagnosis not present

## 2020-07-22 DIAGNOSIS — E7849 Other hyperlipidemia: Secondary | ICD-10-CM | POA: Diagnosis not present

## 2020-07-22 DIAGNOSIS — I10 Essential (primary) hypertension: Secondary | ICD-10-CM | POA: Diagnosis not present

## 2020-07-22 DIAGNOSIS — Z20822 Contact with and (suspected) exposure to covid-19: Secondary | ICD-10-CM | POA: Diagnosis not present

## 2020-07-22 DIAGNOSIS — D509 Iron deficiency anemia, unspecified: Secondary | ICD-10-CM | POA: Diagnosis not present

## 2020-07-22 DIAGNOSIS — E559 Vitamin D deficiency, unspecified: Secondary | ICD-10-CM | POA: Diagnosis not present

## 2020-07-22 DIAGNOSIS — E1140 Type 2 diabetes mellitus with diabetic neuropathy, unspecified: Secondary | ICD-10-CM | POA: Diagnosis not present

## 2020-07-22 DIAGNOSIS — U071 COVID-19: Secondary | ICD-10-CM | POA: Diagnosis not present

## 2020-07-24 DIAGNOSIS — J4530 Mild persistent asthma, uncomplicated: Secondary | ICD-10-CM | POA: Diagnosis not present

## 2020-07-24 DIAGNOSIS — E1140 Type 2 diabetes mellitus with diabetic neuropathy, unspecified: Secondary | ICD-10-CM | POA: Diagnosis not present

## 2020-07-24 DIAGNOSIS — M150 Primary generalized (osteo)arthritis: Secondary | ICD-10-CM | POA: Diagnosis not present

## 2020-07-24 DIAGNOSIS — I10 Essential (primary) hypertension: Secondary | ICD-10-CM | POA: Diagnosis not present

## 2020-08-12 DIAGNOSIS — H5201 Hypermetropia, right eye: Secondary | ICD-10-CM | POA: Diagnosis not present

## 2020-08-12 DIAGNOSIS — H04123 Dry eye syndrome of bilateral lacrimal glands: Secondary | ICD-10-CM | POA: Diagnosis not present

## 2020-08-12 DIAGNOSIS — Z961 Presence of intraocular lens: Secondary | ICD-10-CM | POA: Diagnosis not present

## 2020-08-12 DIAGNOSIS — E119 Type 2 diabetes mellitus without complications: Secondary | ICD-10-CM | POA: Diagnosis not present

## 2020-08-25 DIAGNOSIS — M150 Primary generalized (osteo)arthritis: Secondary | ICD-10-CM | POA: Diagnosis not present

## 2020-08-25 DIAGNOSIS — I10 Essential (primary) hypertension: Secondary | ICD-10-CM | POA: Diagnosis not present

## 2020-08-25 DIAGNOSIS — J4530 Mild persistent asthma, uncomplicated: Secondary | ICD-10-CM | POA: Diagnosis not present

## 2020-08-25 DIAGNOSIS — E1140 Type 2 diabetes mellitus with diabetic neuropathy, unspecified: Secondary | ICD-10-CM | POA: Diagnosis not present

## 2020-09-14 ENCOUNTER — Other Ambulatory Visit (HOSPITAL_COMMUNITY): Payer: Self-pay | Admitting: Family Medicine

## 2020-09-14 DIAGNOSIS — Z1231 Encounter for screening mammogram for malignant neoplasm of breast: Secondary | ICD-10-CM

## 2020-09-22 ENCOUNTER — Encounter (INDEPENDENT_AMBULATORY_CARE_PROVIDER_SITE_OTHER): Payer: Self-pay | Admitting: Internal Medicine

## 2020-09-22 ENCOUNTER — Other Ambulatory Visit: Payer: Self-pay

## 2020-09-22 ENCOUNTER — Other Ambulatory Visit (INDEPENDENT_AMBULATORY_CARE_PROVIDER_SITE_OTHER): Payer: Self-pay | Admitting: Gastroenterology

## 2020-09-22 ENCOUNTER — Ambulatory Visit (INDEPENDENT_AMBULATORY_CARE_PROVIDER_SITE_OTHER): Payer: Medicare Other | Admitting: Internal Medicine

## 2020-09-22 VITALS — BP 133/76 | HR 94 | Temp 98.3°F | Ht 64.0 in | Wt 187.8 lb

## 2020-09-22 DIAGNOSIS — K219 Gastro-esophageal reflux disease without esophagitis: Secondary | ICD-10-CM

## 2020-09-22 MED ORDER — OMEPRAZOLE 20 MG PO CPDR: 20.0000 mg | DELAYED_RELEASE_CAPSULE | Freq: Two times a day (BID) | ORAL | 1 refills | Status: DC

## 2020-09-22 NOTE — Telephone Encounter (Signed)
Can you call patient and see if PCP can refill potassium supplement if she needs? We have not seen her in over a year and no labs monitoring K+ are available in Epic. Thanks.

## 2020-09-22 NOTE — Telephone Encounter (Signed)
Patient is aware of all. And states she will call the primary care physician to address this.

## 2020-09-22 NOTE — Telephone Encounter (Signed)
Please advise 

## 2020-09-22 NOTE — Progress Notes (Signed)
Presenting complaint;  Follow-up for GERD and history of iron deficiency anemia.  Database and subjective:  Patient is 74 year old Caucasian female who has history of GERD known  hiatal hernia, history of iron deficiency anemia who is here for scheduled visit. She states she had Covid infection October 2020 and has developed high levels of antibody. She complains of regurgitation in the morning.  She also feels pressure in retrosternal area.  She eats her supper at 6 PM.  She does not go to bed for at least 4 hours.  She denies dysphagia nausea vomiting melena or rectal bleeding.  Bowels moving daily.  Her appetite is good and her weight has been stable. She has occasional spells with diarrhea and she is using IBgard which helps.  She also reports occasional right lower quadrant abdominal pain and bloating. She says Dr. Hilma Favors does not want her to take potassium.  He stopped her diuretic.  She is afraid to stop potassium.  Current Medications: Outpatient Encounter Medications as of 09/22/2020  Medication Sig  . acetaminophen (TYLENOL) 500 MG tablet Take 500 mg by mouth as needed.  . Cholecalciferol (VITAMIN D) 2000 UNITS CAPS Take 1 capsule by mouth daily.    . diazepam (VALIUM) 5 MG tablet Take 5 mg by mouth every 8 (eight) hours as needed for anxiety.   . ferrous sulfate 325 (65 FE) MG tablet Take 1 tablet (325 mg total) by mouth daily with breakfast.  . gabapentin (NEURONTIN) 300 MG capsule Take 300 mg by mouth at bedtime.   . metFORMIN (GLUCOPHAGE) 500 MG tablet Take 500 mg by mouth 2 (two) times daily with a meal.   . montelukast (SINGULAIR) 10 MG tablet Take 10 mg by mouth every morning.   Marland Kitchen omeprazole (PRILOSEC) 20 MG capsule TAKE 1 CAPSULE(20 MG) BY MOUTH DAILY BEFORE BREAKFAST  . potassium chloride (KLOR-CON) 10 MEQ tablet TAKE 1 TABLET(10 MEQ) BY MOUTH DAILY  . rivaroxaban (XARELTO) 20 MG TABS tablet Take 20 mg by mouth daily with supper.  . TURMERIC PO Take 1 tablet by mouth  daily.  . vitamin B-12 (CYANOCOBALAMIN) 500 MCG tablet Take 500 mcg by mouth daily.  . famotidine (PEPCID) 20 MG tablet Take 1 tablet (20 mg total) by mouth at bedtime as needed for heartburn or indigestion. (Patient not taking: Reported on 09/22/2020)  . hyoscyamine (LEVSIN SL) 0.125 MG SL tablet DISSOLVE 1 TABLET(0.125 MG) UNDER THE TONGUE EVERY 6 HOURS AS NEEDED (Patient not taking: Reported on 09/22/2020)  . [DISCONTINUED] hydrochlorothiazide (HYDRODIURIL) 25 MG tablet Take 12.5 mg by mouth daily. (Patient not taking: Reported on 09/22/2020)  . [DISCONTINUED] ibuprofen (ADVIL) 400 MG tablet Take 1 tablet (400 mg total) by mouth as needed for moderate pain (ostoarthritis). 1-2 tables twice daily as needed. Patient takes for Arthritis. (Patient not taking: Reported on 09/22/2020)  . [DISCONTINUED] predniSONE (DELTASONE) 20 MG tablet Take 3 po QD x 3d , then 2 po QD x 3d then 1 po QD x 3d   No facility-administered encounter medications on file as of 09/22/2020.    Objective: Blood pressure 133/76, pulse 94, temperature 98.3 F (36.8 C), temperature source Oral, height '5\' 4"'  (1.626 m), weight 187 lb 12.8 oz (85.2 kg). Patient is alert and in no acute distress. She is wearing a mask. Conjunctiva is pink. Sclera is nonicteric Oropharyngeal mucosa is normal. No neck masses or thyromegaly noted. Cardiac exam with regular rhythm normal S1 and S2. No murmur or gallop noted. Lungs are clear to auscultation. Abdomen  is full but soft and nontender without organomegaly or masses. No LE edema or clubbing noted.  Labs/studies Results:  CBC Latest Ref Rng & Units 10/27/2019 06/24/2018 11/01/2017  WBC 4.0 - 10.5 K/uL 4.3 9.8 9.7  Hemoglobin 12.0 - 15.0 g/dL 15.0 15.0 14.4  Hematocrit 36 - 46 % 45.7 45.9 43.1  Platelets 150 - 400 K/uL 153 240 242    CMP Latest Ref Rng & Units 10/27/2019 06/24/2018 11/03/2017  Glucose 70 - 99 mg/dL 123(H) 157(H) 120(H)  BUN 8 - 23 mg/dL 20 25(H) 20  Creatinine 0.44 - 1.00  mg/dL 1.11(H) 1.57(H) 0.83  Sodium 135 - 145 mmol/L 133(L) 140 138  Potassium 3.5 - 5.1 mmol/L 3.5 3.8 3.9  Chloride 98 - 111 mmol/L 102 105 104  CO2 22 - 32 mmol/L '23 27 26  ' Calcium 8.9 - 10.3 mg/dL 8.8(L) 9.3 9.6  Total Protein 6.5 - 8.1 g/dL 7.0 7.1 -  Total Bilirubin 0.3 - 1.2 mg/dL 0.5 0.6 -  Alkaline Phos 38 - 126 U/L 56 58 -  AST 15 - 41 U/L 24 21 -  ALT 0 - 44 U/L 27 33 -    Hepatic Function Latest Ref Rng & Units 10/27/2019 06/24/2018 01/06/2015  Total Protein 6.5 - 8.1 g/dL 7.0 7.1 5.6(L)  Albumin 3.5 - 5.0 g/dL 3.9 4.4 3.4(L)  AST 15 - 41 U/L '24 21 14  ' ALT 0 - 44 U/L 27 33 24  Alk Phosphatase 38 - 126 U/L 56 58 60  Total Bilirubin 0.3 - 1.2 mg/dL 0.5 0.6 0.7  Bilirubin, Direct 0.0 - 0.3 mg/dL - - -    Lab Results  Component Value Date   CRP 1.7 (H) 10/27/2019    Lab data from 07/22/2020 H&H 13.5 and 41.9 Platelet count 244K WBC 6.4.    Assessment:  #1.  Chronic GERD.  She has known moderate sized sliding hiatal hernia.  Symptoms are not well controlled with single dose PPI.  She can use second dose on as-needed basis.  #2.  History of iron deficiency anemia.  She was suspected to be losing blood from upper GI tract.  Her H&H has remained normal.  #3.  History of hypokalemia.  She was started on KCl 10 mEq daily by me when she was found to be hypokalemic.  I would recommend that she follow Dr. Delanna Ahmadi recommendations.  Plan:  New prescription given for omeprazole 20 mg p.o. twice daily.  She should take first dose every day and second dose on as-needed basis.  Maybe she can be managed by taking 45 doses in a month rather than 60. Office visit in 6 months.

## 2020-09-22 NOTE — Patient Instructions (Signed)
Take second dose of omeprazole daily for 2 weeks and see if regurgitation/chest pressure would resolve.  Thereafter can use second dose on as-needed basis.

## 2020-09-23 ENCOUNTER — Ambulatory Visit (HOSPITAL_COMMUNITY)
Admission: RE | Admit: 2020-09-23 | Discharge: 2020-09-23 | Disposition: A | Discharge status: Home / Self Care | Payer: Medicare Other | Source: Ambulatory Visit | Attending: Family Medicine | Admitting: Family Medicine

## 2020-09-23 DIAGNOSIS — Z1231 Encounter for screening mammogram for malignant neoplasm of breast: Secondary | ICD-10-CM

## 2020-09-24 DIAGNOSIS — I10 Essential (primary) hypertension: Secondary | ICD-10-CM | POA: Diagnosis not present

## 2020-09-24 DIAGNOSIS — E1165 Type 2 diabetes mellitus with hyperglycemia: Secondary | ICD-10-CM | POA: Diagnosis not present

## 2020-09-25 DIAGNOSIS — Z23 Encounter for immunization: Secondary | ICD-10-CM | POA: Diagnosis not present

## 2020-10-01 DIAGNOSIS — Z1283 Encounter for screening for malignant neoplasm of skin: Secondary | ICD-10-CM | POA: Diagnosis not present

## 2020-10-01 DIAGNOSIS — D225 Melanocytic nevi of trunk: Secondary | ICD-10-CM | POA: Diagnosis not present

## 2020-10-09 DIAGNOSIS — J019 Acute sinusitis, unspecified: Secondary | ICD-10-CM | POA: Diagnosis not present

## 2020-10-09 DIAGNOSIS — Z681 Body mass index (BMI) 19 or less, adult: Secondary | ICD-10-CM | POA: Diagnosis not present

## 2020-10-15 ENCOUNTER — Ambulatory Visit (INDEPENDENT_AMBULATORY_CARE_PROVIDER_SITE_OTHER): Payer: Medicare Other | Admitting: Gastroenterology

## 2020-10-23 ENCOUNTER — Other Ambulatory Visit (INDEPENDENT_AMBULATORY_CARE_PROVIDER_SITE_OTHER): Payer: Self-pay | Admitting: Internal Medicine

## 2020-10-26 DIAGNOSIS — M150 Primary generalized (osteo)arthritis: Secondary | ICD-10-CM | POA: Diagnosis not present

## 2020-10-26 DIAGNOSIS — E1140 Type 2 diabetes mellitus with diabetic neuropathy, unspecified: Secondary | ICD-10-CM | POA: Diagnosis not present

## 2020-10-26 DIAGNOSIS — Z0001 Encounter for general adult medical examination with abnormal findings: Secondary | ICD-10-CM | POA: Diagnosis not present

## 2020-10-26 DIAGNOSIS — Z1331 Encounter for screening for depression: Secondary | ICD-10-CM | POA: Diagnosis not present

## 2020-10-26 DIAGNOSIS — T50Z95A Adverse effect of other vaccines and biological substances, initial encounter: Secondary | ICD-10-CM | POA: Diagnosis not present

## 2020-10-26 DIAGNOSIS — I2699 Other pulmonary embolism without acute cor pulmonale: Secondary | ICD-10-CM | POA: Diagnosis not present

## 2020-11-24 DIAGNOSIS — I10 Essential (primary) hypertension: Secondary | ICD-10-CM | POA: Diagnosis not present

## 2020-11-24 DIAGNOSIS — E1165 Type 2 diabetes mellitus with hyperglycemia: Secondary | ICD-10-CM | POA: Diagnosis not present

## 2021-01-28 DIAGNOSIS — D509 Iron deficiency anemia, unspecified: Secondary | ICD-10-CM | POA: Diagnosis not present

## 2021-01-28 DIAGNOSIS — Z6835 Body mass index (BMI) 35.0-35.9, adult: Secondary | ICD-10-CM | POA: Diagnosis not present

## 2021-01-28 DIAGNOSIS — N200 Calculus of kidney: Secondary | ICD-10-CM | POA: Diagnosis not present

## 2021-01-28 DIAGNOSIS — Z1331 Encounter for screening for depression: Secondary | ICD-10-CM | POA: Diagnosis not present

## 2021-01-28 DIAGNOSIS — Z1389 Encounter for screening for other disorder: Secondary | ICD-10-CM | POA: Diagnosis not present

## 2021-01-28 DIAGNOSIS — I10 Essential (primary) hypertension: Secondary | ICD-10-CM | POA: Diagnosis not present

## 2021-01-28 DIAGNOSIS — E6609 Other obesity due to excess calories: Secondary | ICD-10-CM | POA: Diagnosis not present

## 2021-01-28 DIAGNOSIS — E1140 Type 2 diabetes mellitus with diabetic neuropathy, unspecified: Secondary | ICD-10-CM | POA: Diagnosis not present

## 2021-02-02 DIAGNOSIS — D509 Iron deficiency anemia, unspecified: Secondary | ICD-10-CM | POA: Diagnosis not present

## 2021-02-02 DIAGNOSIS — Z6835 Body mass index (BMI) 35.0-35.9, adult: Secondary | ICD-10-CM | POA: Diagnosis not present

## 2021-02-02 DIAGNOSIS — N200 Calculus of kidney: Secondary | ICD-10-CM | POA: Diagnosis not present

## 2021-02-02 DIAGNOSIS — Z1331 Encounter for screening for depression: Secondary | ICD-10-CM | POA: Diagnosis not present

## 2021-02-02 DIAGNOSIS — I10 Essential (primary) hypertension: Secondary | ICD-10-CM | POA: Diagnosis not present

## 2021-02-02 DIAGNOSIS — E1140 Type 2 diabetes mellitus with diabetic neuropathy, unspecified: Secondary | ICD-10-CM | POA: Diagnosis not present

## 2021-03-04 ENCOUNTER — Other Ambulatory Visit (HOSPITAL_COMMUNITY): Payer: Self-pay | Admitting: Family Medicine

## 2021-03-04 DIAGNOSIS — Z6835 Body mass index (BMI) 35.0-35.9, adult: Secondary | ICD-10-CM | POA: Diagnosis not present

## 2021-03-04 DIAGNOSIS — E6609 Other obesity due to excess calories: Secondary | ICD-10-CM | POA: Diagnosis not present

## 2021-03-04 DIAGNOSIS — R109 Unspecified abdominal pain: Secondary | ICD-10-CM

## 2021-03-05 ENCOUNTER — Other Ambulatory Visit: Payer: Self-pay

## 2021-03-05 ENCOUNTER — Ambulatory Visit (HOSPITAL_COMMUNITY)
Admission: RE | Admit: 2021-03-05 | Discharge: 2021-03-05 | Disposition: A | Discharge status: Home / Self Care | Payer: Medicare Other | Source: Ambulatory Visit | Attending: Family Medicine | Admitting: Family Medicine

## 2021-03-05 DIAGNOSIS — R1031 Right lower quadrant pain: Secondary | ICD-10-CM | POA: Diagnosis not present

## 2021-03-05 DIAGNOSIS — R109 Unspecified abdominal pain: Secondary | ICD-10-CM | POA: Insufficient documentation

## 2021-03-05 LAB — POCT I-STAT CREATININE: Creatinine, Ser: 0.8 mg/dL (ref 0.44–1.00)

## 2021-03-05 MED ORDER — IOHEXOL 300 MG/ML  SOLN
100.0000 mL | Freq: Once | INTRAMUSCULAR | Status: AC | PRN
  Administered 2021-03-05: 100 mL via INTRAVENOUS

## 2021-03-20 ENCOUNTER — Other Ambulatory Visit (INDEPENDENT_AMBULATORY_CARE_PROVIDER_SITE_OTHER): Payer: Self-pay | Admitting: Internal Medicine

## 2021-03-23 ENCOUNTER — Encounter (INDEPENDENT_AMBULATORY_CARE_PROVIDER_SITE_OTHER): Payer: Self-pay | Admitting: Internal Medicine

## 2021-03-23 ENCOUNTER — Other Ambulatory Visit: Payer: Self-pay

## 2021-03-23 ENCOUNTER — Ambulatory Visit (INDEPENDENT_AMBULATORY_CARE_PROVIDER_SITE_OTHER): Payer: Medicare Other | Admitting: Internal Medicine

## 2021-03-23 VITALS — BP 150/81 | HR 85 | Temp 97.8°F | Ht 64.0 in | Wt 193.9 lb

## 2021-03-23 DIAGNOSIS — K580 Irritable bowel syndrome with diarrhea: Secondary | ICD-10-CM | POA: Diagnosis not present

## 2021-03-23 DIAGNOSIS — R1030 Lower abdominal pain, unspecified: Secondary | ICD-10-CM | POA: Insufficient documentation

## 2021-03-23 DIAGNOSIS — R109 Unspecified abdominal pain: Secondary | ICD-10-CM

## 2021-03-23 DIAGNOSIS — K219 Gastro-esophageal reflux disease without esophagitis: Secondary | ICD-10-CM | POA: Diagnosis not present

## 2021-03-23 DIAGNOSIS — K589 Irritable bowel syndrome without diarrhea: Secondary | ICD-10-CM | POA: Insufficient documentation

## 2021-03-23 MED ORDER — DICYCLOMINE HCL 10 MG PO CAPS: ORAL_CAPSULE | ORAL | 5 refills | Status: DC

## 2021-03-23 NOTE — Patient Instructions (Signed)
Will request copy of recent blood work from Dr. Delanna Ahmadi office. Take dicyclomine 10 mg by mouth 30 minutes before breakfast daily for 1 to 2 weeks.  If this dose does not help can take 20 mg before lunch.

## 2021-03-23 NOTE — Progress Notes (Addendum)
Presenting complaint;  Follow for chronic GERD and IBS with diarrhea. Patient with known large hiatal hernia.  Database and subjective:  Patient is 75 year old Caucasian female who has chronic GERD known history of large hiatal hernia irritable bowel syndrome with diarrhea as well as history of iron deficiency anemia for which she has undergone work-up in the past and anemia has corrected with iron therapy. She says heartburn is well controlled with therapy.  She may have an episode occasionally.  She is watching her diet.  She has had end of bed elevated by 4 inches.  She eats her supper at least 4 hours before she goes to bed. She has new complaint today.  She complains of pain in right mid and lower abdomen.  Pain is intermittent and she has had 3 episodes in the last 30 days.  She says it can last for hours.  When she has pain bed rest helps.  However she cannot lie flat because of hiatal hernia and regurgitation.  She describes this pain to be pressure.  When she has pain she does not have nausea vomiting diarrhea hematuria or dysuria.  She had abdominal pelvic CT on 03/05/2021 and she was found to have stone left renal pelvis.  However pain is not on the left side.  CT also revealed fatty liver and large hiatal hernia. She remains with intermittent diarrhea.  She is having at least 3 bowel movements a day.  Bowel movement usually occurs after each meal.  Imodium helps but she does not take it regularly.  She is still having accidents no more than 3 a month. She says she is worried about her son who has been struggling with vestibular dysfunction for 6 years and now has been referred to [facility]. Patient says she had blood work by Dr. Hilma Favors in it was all normal.  Current Medications: Outpatient Encounter Medications as of 03/23/2021  Medication Sig  . acetaminophen (TYLENOL) 500 MG tablet Take 500 mg by mouth as needed.  . Cholecalciferol (VITAMIN D) 2000 UNITS CAPS Take 1 capsule by mouth daily.   . diazepam (VALIUM) 5 MG tablet Take 5 mg by mouth every 8 (eight) hours as needed for anxiety.  . ferrous sulfate 325 (65 FE) MG tablet Take 1 tablet (325 mg total) by mouth daily with breakfast.  . gabapentin (NEURONTIN) 300 MG capsule Take 300 mg by mouth at bedtime.  . metFORMIN (GLUCOPHAGE) 500 MG tablet Take 500 mg by mouth 2 (two) times daily with a meal.   . montelukast (SINGULAIR) 10 MG tablet Take 10 mg by mouth every morning.  Marland Kitchen omeprazole (PRILOSEC) 20 MG capsule TAKE 1 CAPSULE(20 MG) BY MOUTH TWICE DAILY BEFORE A MEAL  . potassium chloride (KLOR-CON) 10 MEQ tablet TAKE 1 TABLET(10 MEQ) BY MOUTH DAILY  . rivaroxaban (XARELTO) 20 MG TABS tablet Take 20 mg by mouth daily with supper.  . TURMERIC PO Take 1 tablet by mouth daily.  . vitamin B-12 (CYANOCOBALAMIN) 500 MCG tablet Take 500 mcg by mouth daily.  . hyoscyamine (LEVSIN SL) 0.125 MG SL tablet DISSOLVE 1 TABLET(0.125 MG) UNDER THE TONGUE EVERY 6 HOURS AS NEEDED (Patient not taking: No sig reported)   No facility-administered encounter medications on file as of 03/23/2021.     Objective: Blood pressure (!) 150/81, pulse 85, temperature 97.8 F (36.6 C), temperature source Oral, height 5\' 4"  (1.626 m), weight 193 lb 14.4 oz (88 kg). Patient is alert and in no acute distress. She is wearing a mask.  Conjunctiva is pink. Sclera is nonicteric Oropharyngeal mucosa is normal. No neck masses or thyromegaly noted. Cardiac exam with regular rhythm normal S1 and S2.  She has faint systolic murmur best heard at aortic area. Lungs are clear to auscultation. Abdomen is full.  Bowel sounds are normal.  No bruits noted.  Palpation reveals no organomegaly masses or tenderness. She has mild nonpitting edema to left ankle.  Labs/studies Results: Lab data from 07/22/2020 H&H 13.5 and 41.9.  Bilirubin 0.3, AP 71, AST 14, ALT 16, total protein 6.3 and albumin 4.3. BUN 21 and creatinine 0.98.  Assessment:  #1.  Chronic GERD.  She has  known large hiatal hernia.  She is doing well with medical therapy.  I do not see any indication for surgical intervention as most of these hernias tend to recur even after meticulous surgery.  We will continue with medical therapy as long as it is working.  #2.  IBS with diarrhea.  She is using loperamide on as-needed basis.  Symptom control is not satisfactory.  #3.  Right mid and lower abdominal pain.  Pain has been intermittent.  Recent CT did not shed any light as to the source of this pain.  She has stone in her left kidney but none on the right side.  I am not convinced that this pain is part and parcel of her IBS but will see if she responds to antispasmodic.  I feel this pain be referred from her back.  #4.  History of iron deficiency anemia.  Iron deficiency anemia was felt to be due to GI blood loss from hiatal hernia and perhaps impaired iron absorption.  Hemoglobin last year was normal.   Plan:  Request copy of recent blood work from Dr. Delanna Ahmadi office. Dicyclomine 10 mg by mouth 30 minutes before breakfast or lunch daily.  She can increase dose to 20 mg once daily if 10 mg does not work and she has no side effects.  Patient informed of potential side effects such as constipation and dry mouth. She can use loperamide OTC on as-needed basis when she has to leave house. Patient will call with progress report in 2 to 3 weeks. Office visit in 6 months.  Addendum H/H 13.5/41.4 on 01/28/2021 LFTs normal. Creatinine 1.06; patient aware

## 2021-03-24 DIAGNOSIS — E1165 Type 2 diabetes mellitus with hyperglycemia: Secondary | ICD-10-CM | POA: Diagnosis not present

## 2021-03-24 DIAGNOSIS — I10 Essential (primary) hypertension: Secondary | ICD-10-CM | POA: Diagnosis not present

## 2021-03-30 ENCOUNTER — Encounter (INDEPENDENT_AMBULATORY_CARE_PROVIDER_SITE_OTHER): Payer: Self-pay

## 2021-04-24 DIAGNOSIS — E1165 Type 2 diabetes mellitus with hyperglycemia: Secondary | ICD-10-CM | POA: Diagnosis not present

## 2021-04-24 DIAGNOSIS — I10 Essential (primary) hypertension: Secondary | ICD-10-CM | POA: Diagnosis not present

## 2021-05-03 DIAGNOSIS — D509 Iron deficiency anemia, unspecified: Secondary | ICD-10-CM | POA: Diagnosis not present

## 2021-05-03 DIAGNOSIS — Z6835 Body mass index (BMI) 35.0-35.9, adult: Secondary | ICD-10-CM | POA: Diagnosis not present

## 2021-05-03 DIAGNOSIS — E6609 Other obesity due to excess calories: Secondary | ICD-10-CM | POA: Diagnosis not present

## 2021-05-03 DIAGNOSIS — I10 Essential (primary) hypertension: Secondary | ICD-10-CM | POA: Diagnosis not present

## 2021-05-03 DIAGNOSIS — E1140 Type 2 diabetes mellitus with diabetic neuropathy, unspecified: Secondary | ICD-10-CM | POA: Diagnosis not present

## 2021-05-03 DIAGNOSIS — E7849 Other hyperlipidemia: Secondary | ICD-10-CM | POA: Diagnosis not present

## 2021-05-07 DIAGNOSIS — Z20822 Contact with and (suspected) exposure to covid-19: Secondary | ICD-10-CM | POA: Diagnosis not present

## 2021-05-25 DIAGNOSIS — I10 Essential (primary) hypertension: Secondary | ICD-10-CM | POA: Diagnosis not present

## 2021-05-25 DIAGNOSIS — E1165 Type 2 diabetes mellitus with hyperglycemia: Secondary | ICD-10-CM | POA: Diagnosis not present

## 2021-05-31 DIAGNOSIS — M1711 Unilateral primary osteoarthritis, right knee: Secondary | ICD-10-CM | POA: Diagnosis not present

## 2021-06-25 ENCOUNTER — Ambulatory Visit (INDEPENDENT_AMBULATORY_CARE_PROVIDER_SITE_OTHER): Payer: Medicare Other | Admitting: Obstetrics & Gynecology

## 2021-06-25 ENCOUNTER — Encounter: Payer: Self-pay | Admitting: Obstetrics & Gynecology

## 2021-06-25 ENCOUNTER — Other Ambulatory Visit: Payer: Self-pay | Admitting: *Deleted

## 2021-06-25 ENCOUNTER — Other Ambulatory Visit (HOSPITAL_COMMUNITY)
Admission: RE | Admit: 2021-06-25 | Discharge: 2021-06-25 | Disposition: A | Discharge status: Home / Self Care | Payer: Medicare Other | Source: Ambulatory Visit | Attending: Obstetrics & Gynecology | Admitting: Obstetrics & Gynecology

## 2021-06-25 ENCOUNTER — Other Ambulatory Visit: Payer: Self-pay

## 2021-06-25 VITALS — BP 144/74 | HR 82 | Ht 64.0 in | Wt 194.0 lb

## 2021-06-25 DIAGNOSIS — Z1151 Encounter for screening for human papillomavirus (HPV): Secondary | ICD-10-CM | POA: Diagnosis not present

## 2021-06-25 DIAGNOSIS — Z01419 Encounter for gynecological examination (general) (routine) without abnormal findings: Secondary | ICD-10-CM | POA: Diagnosis not present

## 2021-06-25 DIAGNOSIS — R195 Other fecal abnormalities: Secondary | ICD-10-CM

## 2021-06-25 LAB — HEMOCCULT GUIAC POC 1CARD (OFFICE): Fecal Occult Blood, POC: POSITIVE — AB

## 2021-06-25 NOTE — Addendum Note (Signed)
Addended by: Linton Rump on: 06/25/2021 10:59 AM   Modules accepted: Orders

## 2021-06-25 NOTE — Progress Notes (Signed)
Subjective:     [name] is a 75 y.o. female here for a routine exam.  No LMP recorded. Patient is postmenopausal. G1P0 Birth Control Method:  post menopausal Menstrual Calendar(currently): amenorrhea  Current complaints: none.   Current acute medical issues:  none   Recent Gynecologic History No LMP recorded. Patient is postmenopausal. Last Pap: 2018,   Last mammogram: 09/23/20,  normal  Past Medical History:  Diagnosis Date   Arthritis    Asthma    Carpal tunnel syndrome    Chronic anxiety    Essential hypertension    Gastric ulcer    GERD (gastroesophageal reflux disease)    H/O hiatal hernia    History of heart murmur in childhood    Iron deficiency anemia    Neuropathy    Following neck surgery.   Pulmonary embolus [facility])    November 2020   Type 2 diabetes mellitus [facility])     Past Surgical History:  Procedure Laterality Date   APPENDECTOMY     CHOLECYSTECTOMY     COLONOSCOPY     COLONOSCOPY N/A 09/26/2013   Procedure: COLONOSCOPY;  Surgeon: Rogene Houston, MD;  [facility];  Service: Endoscopy;  Laterality: N/A;  1030-rescheduled to 12:00pm Ann notified pt   COLONOSCOPY N/A 03/27/2015   Procedure: COLONOSCOPY;  Surgeon: Rogene Houston, MD;  [facility];  Service: Endoscopy;  Laterality: N/A;  11:15 - moved to 8:25 - Ann to notify pt   ESOPHAGOGASTRODUODENOSCOPY  12/08/2011   Procedure: ESOPHAGOGASTRODUODENOSCOPY (EGD);  Surgeon: Rogene Houston, MD;  [facility]: [facility];  Service: Endoscopy;  Laterality: N/A;  3:00    ESOPHAGOGASTRODUODENOSCOPY N/A 03/27/2015   Procedure: ESOPHAGOGASTRODUODENOSCOPY (EGD);  Surgeon: Rogene Houston, MD;  [facility];  Service: Endoscopy;  Laterality: N/A;   EYE SURGERY Bilateral    cataract surgery   GIVENS CAPSULE STUDY N/A 11/04/2015   Procedure: GIVENS CAPSULE STUDY;  Surgeon: Rogene Houston, MD;  [facility];  Service: Endoscopy;  Laterality: N/A;   Hysterscopy     KNEE  ARTHROSCOPY     Left   NECK SURGERY     x 2    TONSILLECTOMY     TOTAL KNEE ARTHROPLASTY  07/11/2012   Procedure: TOTAL KNEE ARTHROPLASTY;  Surgeon: Ninetta Lights, MD;  Location: [facility];  Service: Orthopedics;  Laterality: Left;  left total knee arthroplasty   UPPER GASTROINTESTINAL ENDOSCOPY     YAG LASER APPLICATION Right 0/62/3762   Procedure: YAG LASER APPLICATION;  Surgeon: Rutherford Guys, MD;  Location: [facility];  Service: Ophthalmology;  Laterality: Right;    OB History     Gravida  1   Para      Term      Preterm      AB      Living  1      SAB      IAB      Ectopic      Multiple      Live Births              Social History   Socioeconomic History   Marital status: Married    Spouse name: Not on file   Number of children: Not on file   Years of education: Not on file   Highest education level: Not on file  Occupational History   Not on file  Tobacco Use   Smoking status: Never   Smokeless tobacco: Never  Vaping Use   Vaping Use: Never used  Substance and Sexual Activity   Alcohol use: No    Alcohol/week: 0.0 standard drinks   Drug use: No   Sexual activity: Not Currently    Birth control/protection: Post-menopausal  Other Topics Concern   Not on file  Social History Narrative   Not on file   Social Determinants of Health   Financial Resource Strain: Low Risk    Difficulty of Paying Living Expenses: Not hard at all  Food Insecurity: No Food Insecurity   Worried About Charity fundraiser in the Last Year: Never true   [place] in the Last Year: Never true  Transportation Needs: No Transportation Needs   Lack of Transportation (Medical): No   Lack of Transportation (Non-Medical): No  Physical Activity: Insufficiently Active   Days of Exercise per Week: 2 days   Minutes of Exercise per Session: 20 min  Stress: No Stress Concern Present   Feeling of Stress : Not at all  Social Connections: Socially Integrated   Frequency of  Communication with Friends and Family: More than three times a week   Frequency of Social Gatherings with Friends and Family: More than three times a week   Attends Religious Services: More than 4 times per year   Active Member of [employer] or Organizations: Yes   Attends Archivist Meetings: 1 to 4 times per year   Marital Status: Married    Family History  Problem Relation Age of Onset   Hypertension Son    Alzheimer's disease Mother    Kidney disease Mother    Kidney disease Father    Cancer Maternal Aunt        leukemia   Liver disease Maternal Uncle    Kidney disease Paternal Uncle        kidney removed   Diabetes Maternal Grandmother    Diabetes Maternal Grandfather    Cancer Paternal Grandmother        breast, liver   Colon cancer Neg Hx      Current Outpatient Medications:    acetaminophen (TYLENOL) 500 MG tablet, Take 500 mg by mouth as needed., Disp: , Rfl:    Cholecalciferol (VITAMIN D) 2000 UNITS CAPS, Take 1 capsule by mouth daily., Disp: , Rfl:    diazepam (VALIUM) 5 MG tablet, Take 5 mg by mouth every 8 (eight) hours as needed for anxiety., Disp: , Rfl:    ferrous sulfate 325 (65 FE) MG tablet, Take 1 tablet (325 mg total) by mouth daily with breakfast., Disp: , Rfl:    gabapentin (NEURONTIN) 300 MG capsule, Take 300 mg by mouth at bedtime., Disp: , Rfl:    metFORMIN (GLUCOPHAGE) 500 MG tablet, Take 500 mg by mouth 2 (two) times daily with a meal. , Disp: , Rfl:    montelukast (SINGULAIR) 10 MG tablet, Take 10 mg by mouth every morning., Disp: , Rfl:    omeprazole (PRILOSEC) 20 MG capsule, TAKE 1 CAPSULE(20 MG) BY MOUTH TWICE DAILY BEFORE A MEAL, Disp: 180 capsule, Rfl: 3   potassium chloride (KLOR-CON) 10 MEQ tablet, TAKE 1 TABLET(10 MEQ) BY MOUTH DAILY, Disp: 90 tablet, Rfl: 0   rivaroxaban (XARELTO) 20 MG TABS tablet, Take 20 mg by mouth daily with supper., Disp: , Rfl:    TURMERIC PO, Take 1 tablet by mouth daily., Disp: , Rfl:    vitamin B-12  (CYANOCOBALAMIN) 500 MCG tablet, Take 500 mcg by mouth daily., Disp: , Rfl:  Review of Systems  Review of Systems  Constitutional: Negative for fever, chills, weight loss, malaise/fatigue and diaphoresis.  HENT: Negative for hearing loss, ear pain, nosebleeds, congestion, sore throat, neck pain, tinnitus and ear discharge.   Eyes: Negative for blurred vision, double vision, photophobia, pain, discharge and redness.  Respiratory: Negative for cough, hemoptysis, sputum production, shortness of breath, wheezing and stridor.   Cardiovascular: Negative for chest pain, palpitations, orthopnea, claudication, leg swelling and PND.  Gastrointestinal: negative for abdominal pain. Negative for heartburn, nausea, vomiting, diarrhea, constipation, blood in stool and melena.  Genitourinary: Negative for dysuria, urgency, frequency, hematuria and flank pain.  Musculoskeletal: Negative for myalgias, back pain, joint pain and falls.  Skin: Negative for itching and rash.  Neurological: Negative for dizziness, tingling, tremors, sensory change, speech change, focal weakness, seizures, loss of consciousness, weakness and headaches.  Endo/Heme/Allergies: Negative for environmental allergies and polydipsia. Does not bruise/bleed easily.  Psychiatric/Behavioral: Negative for depression, suicidal ideas, hallucinations, memory loss and substance abuse. The patient is not nervous/anxious and does not have insomnia.        Objective:  Blood pressure (!) 144/74, pulse 82, height 5\' 4"  (1.626 m), weight 194 lb (88 kg).   Physical Exam  Vitals reviewed. Constitutional: She is oriented to person, place, and time. She appears well-developed and well-nourished.  HENT:  Head: Normocephalic and atraumatic.        Right Ear: External ear normal.  Left Ear: External ear normal.  Nose: Nose normal.  Mouth/Throat: Oropharynx is clear and moist.  Eyes: Conjunctivae and EOM are normal. Pupils are equal, round, and reactive  to light. Right eye exhibits no discharge. Left eye exhibits no discharge. No scleral icterus.  Neck: Normal range of motion. Neck supple. No tracheal deviation present. No thyromegaly present.  Cardiovascular: Normal rate, regular rhythm, normal heart sounds and intact distal pulses.  Exam reveals no gallop and no friction rub.   No murmur heard. Respiratory: Effort normal and breath sounds normal. No respiratory distress. She has no wheezes. She has no rales. She exhibits no tenderness.  GI: Soft. Bowel sounds are normal. She exhibits no distension and no mass. There is no tenderness. There is no rebound and no guarding.  Genitourinary:  Breasts no masses skin changes or nipple changes bilaterally      Vulva is normal without lesions Vagina is pink moist without discharge Cervix normal in appearance and pap is done Uterus is normal size shape and contour Adnexa is negative with normal sized ovaries  {Rectal    hemoccult positive,  normal tone, no masses  Musculoskeletal: Normal range of motion. She exhibits no edema and no tenderness.  Neurological: She is alert and oriented to person, place, and time. She has normal reflexes. She displays normal reflexes. No cranial nerve deficit. She exhibits normal muscle tone. Coordination normal.  Skin: Skin is warm and dry. No rash noted. No erythema. No pallor.  Psychiatric: She has a normal mood and affect. Her behavior is normal. Judgment and thought content normal.       Medications Ordered at today's visit: No orders of the defined types were placed in this encounter.   Other orders placed at today's visit: No orders of the defined types were placed in this encounter.     Assessment:    Normal Gyn exam.   Heme positive hemoccult Plan:    Follow up in: 5 years.    Refer to Dr Olevia Perches office for investigation of heme positive test today No  follow-ups on file.

## 2021-07-01 LAB — CYTOLOGY - PAP
Comment: NEGATIVE
Diagnosis: NEGATIVE
High risk HPV: NEGATIVE

## 2021-07-05 DIAGNOSIS — M1711 Unilateral primary osteoarthritis, right knee: Secondary | ICD-10-CM | POA: Diagnosis not present

## 2021-07-25 DIAGNOSIS — I10 Essential (primary) hypertension: Secondary | ICD-10-CM | POA: Diagnosis not present

## 2021-07-25 DIAGNOSIS — E1165 Type 2 diabetes mellitus with hyperglycemia: Secondary | ICD-10-CM | POA: Diagnosis not present

## 2021-08-06 DIAGNOSIS — I2699 Other pulmonary embolism without acute cor pulmonale: Secondary | ICD-10-CM | POA: Diagnosis not present

## 2021-08-06 DIAGNOSIS — U071 COVID-19: Secondary | ICD-10-CM | POA: Diagnosis not present

## 2021-08-06 DIAGNOSIS — M150 Primary generalized (osteo)arthritis: Secondary | ICD-10-CM | POA: Diagnosis not present

## 2021-08-06 DIAGNOSIS — E6609 Other obesity due to excess calories: Secondary | ICD-10-CM | POA: Diagnosis not present

## 2021-08-06 DIAGNOSIS — R7309 Other abnormal glucose: Secondary | ICD-10-CM | POA: Diagnosis not present

## 2021-08-06 DIAGNOSIS — E1140 Type 2 diabetes mellitus with diabetic neuropathy, unspecified: Secondary | ICD-10-CM | POA: Diagnosis not present

## 2021-08-06 DIAGNOSIS — Z6835 Body mass index (BMI) 35.0-35.9, adult: Secondary | ICD-10-CM | POA: Diagnosis not present

## 2021-08-16 DIAGNOSIS — H5201 Hypermetropia, right eye: Secondary | ICD-10-CM | POA: Diagnosis not present

## 2021-08-16 DIAGNOSIS — E119 Type 2 diabetes mellitus without complications: Secondary | ICD-10-CM | POA: Diagnosis not present

## 2021-08-16 DIAGNOSIS — H04123 Dry eye syndrome of bilateral lacrimal glands: Secondary | ICD-10-CM | POA: Diagnosis not present

## 2021-08-18 ENCOUNTER — Ambulatory Visit (HOSPITAL_COMMUNITY)
Admission: RE | Admit: 2021-08-18 | Discharge: 2021-08-18 | Disposition: A | Discharge status: Home / Self Care | Payer: Medicare Other | Source: Ambulatory Visit | Attending: Internal Medicine | Admitting: Internal Medicine

## 2021-08-18 ENCOUNTER — Other Ambulatory Visit (HOSPITAL_COMMUNITY): Payer: Self-pay | Admitting: Internal Medicine

## 2021-08-18 ENCOUNTER — Other Ambulatory Visit: Payer: Self-pay

## 2021-08-18 DIAGNOSIS — M25551 Pain in right hip: Secondary | ICD-10-CM

## 2021-08-18 DIAGNOSIS — I10 Essential (primary) hypertension: Secondary | ICD-10-CM | POA: Diagnosis not present

## 2021-08-18 DIAGNOSIS — E6609 Other obesity due to excess calories: Secondary | ICD-10-CM | POA: Diagnosis not present

## 2021-08-18 DIAGNOSIS — E1140 Type 2 diabetes mellitus with diabetic neuropathy, unspecified: Secondary | ICD-10-CM | POA: Diagnosis not present

## 2021-08-18 DIAGNOSIS — M1991 Primary osteoarthritis, unspecified site: Secondary | ICD-10-CM | POA: Diagnosis not present

## 2021-08-18 DIAGNOSIS — Z6835 Body mass index (BMI) 35.0-35.9, adult: Secondary | ICD-10-CM | POA: Diagnosis not present

## 2021-09-06 DIAGNOSIS — M545 Low back pain, unspecified: Secondary | ICD-10-CM | POA: Diagnosis not present

## 2021-09-06 DIAGNOSIS — M25551 Pain in right hip: Secondary | ICD-10-CM | POA: Diagnosis not present

## 2021-09-28 ENCOUNTER — Ambulatory Visit (INDEPENDENT_AMBULATORY_CARE_PROVIDER_SITE_OTHER): Payer: Medicare Other | Admitting: Internal Medicine

## 2021-09-28 ENCOUNTER — Encounter (INDEPENDENT_AMBULATORY_CARE_PROVIDER_SITE_OTHER): Payer: Self-pay | Admitting: Internal Medicine

## 2021-09-28 ENCOUNTER — Other Ambulatory Visit: Payer: Self-pay

## 2021-09-28 VITALS — BP 168/85 | HR 125 | Temp 99.7°F | Ht 64.0 in | Wt 194.2 lb

## 2021-09-28 DIAGNOSIS — K219 Gastro-esophageal reflux disease without esophagitis: Secondary | ICD-10-CM

## 2021-09-28 DIAGNOSIS — K582 Mixed irritable bowel syndrome: Secondary | ICD-10-CM | POA: Diagnosis not present

## 2021-09-28 DIAGNOSIS — R1030 Lower abdominal pain, unspecified: Secondary | ICD-10-CM | POA: Diagnosis not present

## 2021-09-28 DIAGNOSIS — J019 Acute sinusitis, unspecified: Secondary | ICD-10-CM | POA: Diagnosis not present

## 2021-09-28 MED ORDER — METRONIDAZOLE 250 MG PO TABS: 250.0000 mg | ORAL_TABLET | Freq: Three times a day (TID) | ORAL | 0 refills | Status: DC

## 2021-09-28 MED ORDER — DICYCLOMINE HCL 10 MG PO CAPS: 10.0000 mg | ORAL_CAPSULE | Freq: Three times a day (TID) | ORAL | 2 refills | Status: DC | PRN

## 2021-09-28 NOTE — Patient Instructions (Addendum)
Take dicyclomine 10 mg before breakfast and lunch to begin with.  If you experience no side effects you can take a third dose as discussed. Try fiber Gummies 4 g daily at bedtime. Use glycerin or Dulcolax if you go more than 1 day without a bowel movement. Keep daily symptom diary as to pain, flatulence and stool frequency and consistency. Progress report in 2 weeks.

## 2021-09-28 NOTE — Progress Notes (Signed)
Presenting complaint;  Follow for chronic GERD. Patient complains of lower abdominal pain.  Database and subjective:  Patient is 75 year old Caucasian female with chronic GERD known large sliding hiatal hernia history of iron deficiency anemia which has corrected as well as history of irregular bowel movements and abdominal pain felt to be due to IBS who is here for scheduled visit.   She has had abdominal pain off and on for a while.  She had abdominal pelvic CT in March 2022 prior to her last visit and this study revealed no abnormality to account for pain.  Patient says she is doing well as far as GERD is concerned.  She rarely has heartburn.  She denies nausea vomiting dysphagia hoarseness or chronic cough.  She is not having any side effects with PPI.  She has head end of bed elevated. She continues to complain of lower abdominal pain.  She says she has had daily pain for 1 month.  Other day she was up all night.  She remains with irregular bowel movements.  She had no bowel movement for 3 to 4 days and then this morning she had loose stools.  She goes from one extreme to the other extreme.  She is not sure if she has less pain when she is constipated or when she is having diarrhea.  She denies fever or chills.  She also denies melena or rectal bleeding.  When she has spells of diarrhea she has urgency and she has an accident at least once a month.  She describes this pain to be rambling and at times she feels things are moving in her abdomen. She states she was seen by Dr. Elonda Husky about 3 months ago and her stool was heme positive.  She states she had been constipated and had been straining prior to that exam.  Her pelvic exam was within normal limits though. 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 She also had EGD in April 2016 revealing moderate to large sliding hiatal hernia  with erosion at the level of diaphragmatic hiatus and multiple gastric polyps which were biopsied and turned out to be hyperplastic polyps.  Current Medications: Outpatient Encounter Medications as of 09/28/2021  Medication Sig   acetaminophen (TYLENOL) 500 MG tablet Take 500 mg by mouth as needed.   Cholecalciferol (VITAMIN D) 2000 UNITS CAPS Take 1 capsule by mouth daily.   diazepam (VALIUM) 5 MG tablet Take 5 mg by mouth every 8 (eight) hours as needed for anxiety.   ferrous sulfate 325 (65 FE) MG tablet Take 1 tablet (325 mg total) by mouth daily with breakfast.   gabapentin (NEURONTIN) 300 MG capsule Take 300 mg by mouth at bedtime.   metFORMIN (GLUCOPHAGE) 500 MG tablet Take 500 mg by mouth 2 (two) times daily with a meal.    montelukast (SINGULAIR) 10 MG tablet Take 10 mg by mouth every morning.   omeprazole (PRILOSEC) 20 MG capsule TAKE 1 CAPSULE(20 MG) BY MOUTH TWICE DAILY BEFORE A MEAL   potassium chloride (KLOR-CON) 10 MEQ tablet TAKE 1 TABLET(10 MEQ) BY MOUTH DAILY   rivaroxaban (XARELTO) 20 MG TABS tablet Take 20 mg by mouth daily with supper.   TURMERIC PO Take 1 tablet by mouth daily.   vitamin B-12 (CYANOCOBALAMIN) 500 MCG tablet Take 500 mcg by mouth daily.   No facility-administered encounter medications on file as of 09/28/2021.  Objective: Blood pressure (!) 168/85, pulse (!) 125, temperature 99.7 F (37.6 C), temperature source Oral, height '5\' 4"'  (1.626 m), weight 194 lb 3.2 oz (88.1 kg). Patient is alert and in no acute distress. Conjunctiva is pink. Sclera is nonicteric Oropharyngeal mucosa is normal. No neck masses or thyromegaly noted. Cardiac exam with regular rhythm normal S1 and S2. No murmur or gallop noted. Lungs are clear to auscultation. Abdomen is symmetrical.  Bowel sounds are normal.  On palpation abdomen is soft and nontender with organomegaly or masses. No LE edema or clubbing noted.  Labs/studies Results:   CBC Latest Ref Rng & Units  10/27/2019 06/24/2018 11/01/2017  WBC 4.0 - 10.5 K/uL 4.3 9.8 9.7  Hemoglobin 12.0 - 15.0 g/dL 15.0 15.0 14.4  Hematocrit 36.0 - 46.0 % 45.7 45.9 43.1  Platelets 150 - 400 K/uL 153 240 242    CMP Latest Ref Rng & Units 03/05/2021 10/27/2019 06/24/2018  Glucose 70 - 99 mg/dL - 123(H) 157(H)  BUN 8 - 23 mg/dL - 20 25(H)  Creatinine 0.44 - 1.00 mg/dL 0.80 1.11(H) 1.57(H)  Sodium 135 - 145 mmol/L - 133(L) 140  Potassium 3.5 - 5.1 mmol/L - 3.5 3.8  Chloride 98 - 111 mmol/L - 102 105  CO2 22 - 32 mmol/L - 23 27  Calcium 8.9 - 10.3 mg/dL - 8.8(L) 9.3  Total Protein 6.5 - 8.1 g/dL - 7.0 7.1  Total Bilirubin 0.3 - 1.2 mg/dL - 0.5 0.6  Alkaline Phos 38 - 126 U/L - 56 58  AST 15 - 41 U/L - 24 21  ALT 0 - 44 U/L - 27 33    Hepatic Function Latest Ref Rng & Units 10/27/2019 06/24/2018 01/06/2015  Total Protein 6.5 - 8.1 g/dL 7.0 7.1 5.6(L)  Albumin 3.5 - 5.0 g/dL 3.9 4.4 3.4(L)  AST 15 - 41 U/L '24 21 14  ' ALT 0 - 44 U/L 27 33 24  Alk Phosphatase 38 - 126 U/L 56 58 60  Total Bilirubin 0.3 - 1.2 mg/dL 0.5 0.6 0.7  Bilirubin, Direct 0.0 - 0.3 mg/dL - - -     H&H 13.5 and 41.4 on 01/28/2021.  MCV was 90 and platelet count was 254K.  Assessment:  #1.  Chronic GERD.  Patient has moderate to large sliding hiatal hernia.  She is requiring omeprazole 20 mg twice daily for symptom control.  She did not do well with single dose.  She will continue this therapy as long it is working and she is not having any side effects.  #2.  Lower abdominal pain.  She also has irregular bowel movements.  She goes from diarrhea to constipation to diarrhea.  She had CT in March 2022 and it did not reveal any abnormality to account for her symptoms.  I believe of her bowels are normal she would have less pain.  #3.  Recent heme positive stool noted on gynecologic evaluation.  Patient reports having been constipated and has been straining and had flareup with her hemorrhoids.  She had colonoscopy in 2014 and again in April 2016.   Therefore no indication for colonoscopy or endoscopic evaluation at this time.     Plan:  Dicyclomine 10 mg before breakfast and lunch to begin with.  If she has no side effects she can take third dose but hopefully not.  Patient aware of potential side effects i.e. constipation and dry mouth. Patient advised to take fiber supplement and she can take fiber Gummies 4 g daily. Use glycerin  or Dulcolax suppository if she goes more than 1 day without a bowel movement. She will keep symptom diary asked to severity of pain flatulence stool frequency and consistency and call with progress report in 2 weeks. Office visit in 3 months.

## 2021-10-01 DIAGNOSIS — N390 Urinary tract infection, site not specified: Secondary | ICD-10-CM | POA: Diagnosis not present

## 2021-10-01 DIAGNOSIS — E6609 Other obesity due to excess calories: Secondary | ICD-10-CM | POA: Diagnosis not present

## 2021-10-01 DIAGNOSIS — Z6836 Body mass index (BMI) 36.0-36.9, adult: Secondary | ICD-10-CM | POA: Diagnosis not present

## 2021-10-07 DIAGNOSIS — L820 Inflamed seborrheic keratosis: Secondary | ICD-10-CM | POA: Diagnosis not present

## 2021-10-07 DIAGNOSIS — X32XXXD Exposure to sunlight, subsequent encounter: Secondary | ICD-10-CM | POA: Diagnosis not present

## 2021-10-07 DIAGNOSIS — D225 Melanocytic nevi of trunk: Secondary | ICD-10-CM | POA: Diagnosis not present

## 2021-10-07 DIAGNOSIS — C44619 Basal cell carcinoma of skin of left upper limb, including shoulder: Secondary | ICD-10-CM | POA: Diagnosis not present

## 2021-10-07 DIAGNOSIS — L570 Actinic keratosis: Secondary | ICD-10-CM | POA: Diagnosis not present

## 2021-10-07 DIAGNOSIS — Z1283 Encounter for screening for malignant neoplasm of skin: Secondary | ICD-10-CM | POA: Diagnosis not present

## 2021-10-25 DIAGNOSIS — Z23 Encounter for immunization: Secondary | ICD-10-CM | POA: Diagnosis not present

## 2021-12-01 DIAGNOSIS — E1165 Type 2 diabetes mellitus with hyperglycemia: Secondary | ICD-10-CM | POA: Diagnosis not present

## 2021-12-01 DIAGNOSIS — E7849 Other hyperlipidemia: Secondary | ICD-10-CM | POA: Diagnosis not present

## 2021-12-01 DIAGNOSIS — E1140 Type 2 diabetes mellitus with diabetic neuropathy, unspecified: Secondary | ICD-10-CM | POA: Diagnosis not present

## 2021-12-01 DIAGNOSIS — Z6836 Body mass index (BMI) 36.0-36.9, adult: Secondary | ICD-10-CM | POA: Diagnosis not present

## 2021-12-01 DIAGNOSIS — E669 Obesity, unspecified: Secondary | ICD-10-CM | POA: Diagnosis not present

## 2021-12-01 DIAGNOSIS — J019 Acute sinusitis, unspecified: Secondary | ICD-10-CM | POA: Diagnosis not present

## 2021-12-01 DIAGNOSIS — D509 Iron deficiency anemia, unspecified: Secondary | ICD-10-CM | POA: Diagnosis not present

## 2021-12-01 DIAGNOSIS — I2699 Other pulmonary embolism without acute cor pulmonale: Secondary | ICD-10-CM | POA: Diagnosis not present

## 2021-12-01 DIAGNOSIS — E782 Mixed hyperlipidemia: Secondary | ICD-10-CM | POA: Diagnosis not present

## 2021-12-01 DIAGNOSIS — I10 Essential (primary) hypertension: Secondary | ICD-10-CM | POA: Diagnosis not present

## 2021-12-01 DIAGNOSIS — N390 Urinary tract infection, site not specified: Secondary | ICD-10-CM | POA: Diagnosis not present

## 2022-01-17 ENCOUNTER — Other Ambulatory Visit (HOSPITAL_COMMUNITY): Payer: Self-pay | Admitting: Family Medicine

## 2022-01-17 DIAGNOSIS — Z1231 Encounter for screening mammogram for malignant neoplasm of breast: Secondary | ICD-10-CM

## 2022-01-25 ENCOUNTER — Ambulatory Visit (INDEPENDENT_AMBULATORY_CARE_PROVIDER_SITE_OTHER): Payer: Medicare Other | Admitting: Internal Medicine

## 2022-01-26 ENCOUNTER — Other Ambulatory Visit: Payer: Self-pay

## 2022-01-26 ENCOUNTER — Ambulatory Visit (HOSPITAL_COMMUNITY)
Admission: RE | Admit: 2022-01-26 | Discharge: 2022-01-26 | Disposition: A | Discharge status: Home / Self Care | Payer: Medicare Other | Source: Ambulatory Visit | Attending: Family Medicine | Admitting: Family Medicine

## 2022-01-26 DIAGNOSIS — Z1231 Encounter for screening mammogram for malignant neoplasm of breast: Secondary | ICD-10-CM | POA: Diagnosis not present

## 2022-01-27 ENCOUNTER — Other Ambulatory Visit (HOSPITAL_COMMUNITY): Payer: Self-pay | Admitting: Family Medicine

## 2022-01-27 DIAGNOSIS — R928 Other abnormal and inconclusive findings on diagnostic imaging of breast: Secondary | ICD-10-CM

## 2022-02-11 DIAGNOSIS — Z6836 Body mass index (BMI) 36.0-36.9, adult: Secondary | ICD-10-CM | POA: Diagnosis not present

## 2022-02-11 DIAGNOSIS — Z1331 Encounter for screening for depression: Secondary | ICD-10-CM | POA: Diagnosis not present

## 2022-02-11 DIAGNOSIS — K589 Irritable bowel syndrome without diarrhea: Secondary | ICD-10-CM | POA: Diagnosis not present

## 2022-02-11 DIAGNOSIS — E6609 Other obesity due to excess calories: Secondary | ICD-10-CM | POA: Diagnosis not present

## 2022-02-15 ENCOUNTER — Other Ambulatory Visit: Payer: Self-pay

## 2022-02-15 ENCOUNTER — Ambulatory Visit (HOSPITAL_COMMUNITY)
Admission: RE | Admit: 2022-02-15 | Discharge: 2022-02-15 | Disposition: A | Discharge status: Home / Self Care | Payer: Medicare Other | Source: Ambulatory Visit | Attending: Family Medicine | Admitting: Family Medicine

## 2022-02-15 ENCOUNTER — Encounter (INDEPENDENT_AMBULATORY_CARE_PROVIDER_SITE_OTHER): Payer: Self-pay

## 2022-02-15 ENCOUNTER — Ambulatory Visit (INDEPENDENT_AMBULATORY_CARE_PROVIDER_SITE_OTHER): Payer: Medicare Other | Admitting: Gastroenterology

## 2022-02-15 DIAGNOSIS — R922 Inconclusive mammogram: Secondary | ICD-10-CM | POA: Diagnosis not present

## 2022-02-15 DIAGNOSIS — R928 Other abnormal and inconclusive findings on diagnostic imaging of breast: Secondary | ICD-10-CM | POA: Diagnosis not present

## 2022-02-22 ENCOUNTER — Ambulatory Visit (INDEPENDENT_AMBULATORY_CARE_PROVIDER_SITE_OTHER): Payer: Medicare Other | Admitting: Internal Medicine

## 2022-02-22 ENCOUNTER — Other Ambulatory Visit: Payer: Self-pay

## 2022-02-22 ENCOUNTER — Encounter (INDEPENDENT_AMBULATORY_CARE_PROVIDER_SITE_OTHER): Payer: Self-pay | Admitting: Internal Medicine

## 2022-02-22 VITALS — BP 147/71 | HR 94 | Temp 98.6°F | Ht 64.0 in | Wt 193.7 lb

## 2022-02-22 DIAGNOSIS — K219 Gastro-esophageal reflux disease without esophagitis: Secondary | ICD-10-CM | POA: Diagnosis not present

## 2022-02-22 DIAGNOSIS — Z862 Personal history of diseases of the blood and blood-forming organs and certain disorders involving the immune mechanism: Secondary | ICD-10-CM | POA: Diagnosis not present

## 2022-02-22 DIAGNOSIS — K580 Irritable bowel syndrome with diarrhea: Secondary | ICD-10-CM

## 2022-02-22 MED ORDER — OMEPRAZOLE 20 MG PO CPDR: DELAYED_RELEASE_CAPSULE | ORAL | 3 refills | Status: DC

## 2022-02-22 NOTE — Patient Instructions (Addendum)
Physician will call with results of blood test when completed. Can try decreasing omeprazole dose to 20 mg daily before breakfast and see how you do.  If symptoms relapse you can go back to twice daily schedule. Please incorporate some physical activity i.e. walking into her lifestyle as discussed.

## 2022-02-22 NOTE — Progress Notes (Addendum)
Presenting complaint;  Follow-up for chronic GERD.  Patient with large hiatal hernia as well as history of IBS   Database and subjective:  Patient is 76 year old Caucasian female who has chronic GERD known large sliding hiatal hernia as well as history of iron deficiency anemia for which she has been worked up in the past and H&H has remote normal while on p.o. iron who was last seen in October 2022 for lower abdominal pain.  She also complained of having irregular bowel movements.  She would have diarrhea followed by constipation.  I felt the symptoms are secondary to IBS.  On her last visit she also reported having heme positive stool by her her gynecologist.  This was felt to be due to issues with hemorrhoids. Her H&H was normal. Patient was begun on fiber supplement and dicyclomine.  She feels she is doing better with dicyclomine but she is still having intermittent diarrhea.  She had explosive diarrhea about a month ago.  Then she will go 2 days without a bowel movement.  She has been using glycerin suppositories with good results.  She is not having any side effects with dicyclomine. She says she has heartburn only with certain foods that she should not be eating to begin with.  She denies regurgitation or dysphagia.  She is using 1-2 doses of Tums per month. She denies melena or rectal bleeding. She is not doing any regular physical activity.  She says she takes diazepam twice a day. She states she developed pulmonary embolism secondary to COVID.  Current Medications: Outpatient Encounter Medications as of 02/22/2022  Medication Sig   acetaminophen (TYLENOL) 500 MG tablet Take 500 mg by mouth as needed.   Cholecalciferol (VITAMIN D) 2000 UNITS CAPS Take 1 capsule by mouth daily.   diazepam (VALIUM) 5 MG tablet Take 5 mg by mouth every 8 (eight) hours as needed for anxiety.   dicyclomine (BENTYL) 10 MG capsule Take 1 capsule (10 mg total) by mouth 3 (three) times daily as needed for  spasms.   ferrous sulfate 325 (65 FE) MG tablet Take 1 tablet (325 mg total) by mouth daily with breakfast.   gabapentin (NEURONTIN) 300 MG capsule Take 300 mg by mouth at bedtime.   metFORMIN (GLUCOPHAGE) 500 MG tablet Take 500 mg by mouth 2 (two) times daily with a meal.    montelukast (SINGULAIR) 10 MG tablet Take 10 mg by mouth every morning.   omeprazole (PRILOSEC) 20 MG capsule TAKE 1 CAPSULE(20 MG) BY MOUTH TWICE DAILY BEFORE A MEAL   OVER THE COUNTER MEDICATION Fiber Gummies 2 daily   OVER THE COUNTER MEDICATION Tums as needed   potassium chloride (KLOR-CON) 10 MEQ tablet TAKE 1 TABLET(10 MEQ) BY MOUTH DAILY   rivaroxaban (XARELTO) 20 MG TABS tablet Take 20 mg by mouth daily with supper.   TURMERIC PO Take 1 tablet by mouth daily.   vitamin B-12 (CYANOCOBALAMIN) 500 MCG tablet Take 500 mcg by mouth daily.   [DISCONTINUED] metroNIDAZOLE (FLAGYL) 250 MG tablet Take 1 tablet (250 mg total) by mouth 3 (three) times daily after meals.   No facility-administered encounter medications on file as of 02/22/2022.     Objective: Blood pressure (!) 147/71, pulse 94, temperature 98.6 F (37 C), temperature source Oral, height _0  (1.626 m), weight 193 lb 11.2 oz (87.9 kg). Patient is alert and in no acute distress. Conjunctiva is pink. Sclera is nonicteric Oropharyngeal mucosa is normal. No neck masses or thyromegaly noted. Cardiac exam with regular  rhythm normal S1 and S2. No murmur or gallop noted. Lungs are clear to auscultation. Abdomen is full but soft and nontender with organomegaly or masses. No LE edema or clubbing noted.  Labs/studies Results:   CBC Latest Ref Rng & Units 10/27/2019 06/24/2018 11/01/2017  WBC 4.0 - 10.5 K/uL 4.3 9.8 9.7  Hemoglobin 12.0 - 15.0 g/dL 15.0 15.0 14.4  Hematocrit 36.0 - 46.0 % 45.7 45.9 43.1  Platelets 150 - 400 K/uL 153 240 242    CMP Latest Ref Rng & Units 03/05/2021 10/27/2019 06/24/2018  Glucose 70 - 99 mg/dL - 123(H) 157(H)  BUN 8 - 23 mg/dL -  20 25(H)  Creatinine 0.44 - 1.00 mg/dL 0.80 1.11(H) 1.57(H)  Sodium 135 - 145 mmol/L - 133(L) 140  Potassium 3.5 - 5.1 mmol/L - 3.5 3.8  Chloride 98 - 111 mmol/L - 102 105  CO2 22 - 32 mmol/L - 23 27  Calcium 8.9 - 10.3 mg/dL - 8.8(L) 9.3  Total Protein 6.5 - 8.1 g/dL - 7.0 7.1  Total Bilirubin 0.3 - 1.2 mg/dL - 0.5 0.6  Alkaline Phos 38 - 126 U/L - 56 58  AST 15 - 41 U/L - 24 21  ALT 0 - 44 U/L - 27 33    Hepatic Function Latest Ref Rng & Units 10/27/2019 06/24/2018 01/06/2015  Total Protein 6.5 - 8.1 g/dL 7.0 7.1 5.6(L)  Albumin 3.5 - 5.0 g/dL 3.9 4.4 3.4(L)  AST 15 - 41 U/L _0 ALT 0 - 44 U/L 27 33 24  Alk Phosphatase 38 - 126 U/L 56 58 60  Total Bilirubin 0.3 - 1.2 mg/dL 0.5 0.6 0.7  Bilirubin, Direct 0.0 - 0.3 mg/dL - - -    CBC from 12/01/2021 WBC 6.1 H&H 13.3 and 39.7 MCV 89 Platelet count 257K   Assessment:  #1.  Chronic GERD.  She has known large hiatal hernia.  She is doing well with double dose PPI.  She is watching her diet.  It be worth trying to reduce PPI dose.  If it does not work she will go back to twice daily scheduled.  Will not change her prescription until it established that she is doing well 20 mg of omeprazole daily.  #2.  Irritable bowel syndrome.  Her symptoms are typical.  Nevertheless we will screen for celiac disease since she has had history of iron deficiency anemia.  #3.  History of iron deficiency anemia.  Anemia felt to be secondary to impaired iron absorption but she may have lost blood due to reflux/hiatal hernia given history of melena few years back.  #4.  Patient is average risk for CRC.  She had colonoscopy in October 2014 and again in April 2016.  No polyps were found on either of these exams.  Therefore she may not need any more colonoscopies unless she has symptoms.   Plan:  Patient will go to the lab for celiac disease panel. Decrease omeprazole dose to 20 mg daily and take it 30 minutes before breakfast.  If dose reduction  does not work she can go back to taking it twice daily. Patient advised to increase physical activity such as walking at least 3-4 times a week and for 30 minutes each time. Office visit in 6 months

## 2022-02-24 DIAGNOSIS — K580 Irritable bowel syndrome with diarrhea: Secondary | ICD-10-CM | POA: Diagnosis not present

## 2022-02-24 DIAGNOSIS — Z862 Personal history of diseases of the blood and blood-forming organs and certain disorders involving the immune mechanism: Secondary | ICD-10-CM | POA: Diagnosis not present

## 2022-02-25 LAB — CELIAC DISEASE PANEL
(tTG) Ab, IgA: <1 U/mL
(tTG) Ab, IgG: <1 U/mL
Gliadin IgA: 2 U/mL
Gliadin IgG: <1 U/mL
Immunoglobulin A: 87 mg/dL (ref 70–320)

## 2022-03-02 DIAGNOSIS — E1140 Type 2 diabetes mellitus with diabetic neuropathy, unspecified: Secondary | ICD-10-CM | POA: Diagnosis not present

## 2022-03-02 DIAGNOSIS — E6609 Other obesity due to excess calories: Secondary | ICD-10-CM | POA: Diagnosis not present

## 2022-03-02 DIAGNOSIS — Z1331 Encounter for screening for depression: Secondary | ICD-10-CM | POA: Diagnosis not present

## 2022-03-02 DIAGNOSIS — I2699 Other pulmonary embolism without acute cor pulmonale: Secondary | ICD-10-CM | POA: Diagnosis not present

## 2022-03-02 DIAGNOSIS — J4530 Mild persistent asthma, uncomplicated: Secondary | ICD-10-CM | POA: Diagnosis not present

## 2022-03-02 DIAGNOSIS — D509 Iron deficiency anemia, unspecified: Secondary | ICD-10-CM | POA: Diagnosis not present

## 2022-03-02 DIAGNOSIS — Z6835 Body mass index (BMI) 35.0-35.9, adult: Secondary | ICD-10-CM | POA: Diagnosis not present

## 2022-03-02 DIAGNOSIS — E782 Mixed hyperlipidemia: Secondary | ICD-10-CM | POA: Diagnosis not present

## 2022-03-02 DIAGNOSIS — I10 Essential (primary) hypertension: Secondary | ICD-10-CM | POA: Diagnosis not present

## 2022-03-02 DIAGNOSIS — M150 Primary generalized (osteo)arthritis: Secondary | ICD-10-CM | POA: Diagnosis not present

## 2022-03-02 DIAGNOSIS — Z0001 Encounter for general adult medical examination with abnormal findings: Secondary | ICD-10-CM | POA: Diagnosis not present

## 2022-03-23 ENCOUNTER — Ambulatory Visit (INDEPENDENT_AMBULATORY_CARE_PROVIDER_SITE_OTHER): Payer: Medicare Other

## 2022-03-23 ENCOUNTER — Ambulatory Visit
Admission: EM | Admit: 2022-03-23 | Discharge: 2022-03-23 | Disposition: A | Discharge status: Home / Self Care | Payer: Medicare Other | Attending: Family Medicine | Admitting: Family Medicine

## 2022-03-23 ENCOUNTER — Encounter: Payer: Self-pay | Admitting: Emergency Medicine

## 2022-03-23 DIAGNOSIS — M533 Sacrococcygeal disorders, not elsewhere classified: Secondary | ICD-10-CM

## 2022-03-23 DIAGNOSIS — W19XXXA Unspecified fall, initial encounter: Secondary | ICD-10-CM | POA: Diagnosis not present

## 2022-03-23 NOTE — ED Provider Notes (Signed)
?[facility] ? ? ? ?CSN: [id number] ?Arrival date & time: 03/23/22  1413 ? ? ?  ? ?History   ?Chief Complaint ?No chief complaint on file. ? ? ?HPI ?[name] is a 76 y.o. female.  ? ?Presenting today with 1 week history of sacral pain after falling onto a bookshelf, landing directly on this area.  She states that she only has pain when she is sitting directly onto the area, especially into a hard chair.  She is walking without difficulty, no radiation of pain down legs, weakness, numbness, tingling, bruising to the area, bowel or bladder incontinence, saddle anesthesia.  No other injuries from the fall per patient.  She states she broke the tailbone many years ago and it felt similar so she is concerned about this.  Has not tried anything over-the-counter for symptoms thus far. ? ? ?Past Medical History:  ?Diagnosis Date  ? Arthritis   ? Asthma   ? Carpal tunnel syndrome   ? Chronic anxiety   ? Essential hypertension   ? Gastric ulcer   ? GERD (gastroesophageal reflux disease)   ? H/O hiatal hernia   ? History of heart murmur in childhood   ? Iron deficiency anemia   ? Neuropathy   ? Following neck surgery.  ? Pulmonary embolus ([place])   ? November 2020  ? Type 2 diabetes mellitus ([place])   ? ? ?Patient Active Problem List  ? Diagnosis Date Noted  ? History of iron deficiency anemia 02/22/2022  ? IBS (irritable colon syndrome) 03/23/2021  ? Lower abdominal pain 03/23/2021  ? Well woman exam with routine gynecological exam 11/22/2017  ? Controlled type 2 diabetes mellitus ([place]) 11/02/2017  ? Nonspecific chest pain 11/02/2017  ? Bilateral leg edema   ? Diabetes ([place]) 03/28/2016  ? Pain in the chest   ? GERD (gastroesophageal reflux disease)   ? Chest pain 01/05/2015  ? Neuropathy 01/05/2015  ? Bronchitis   ? Guaiac positive hemoccult card on annual exam 05/08/2014  ? Chest pain at rest 11/14/2013  ? Left knee pain 09/18/2013  ? Salmonella 05/24/2013  ? Thrombocytopenia, unspecified ([place]) 05/24/2013  ?  Acute gastroenteritis 05/21/2013  ? Fever 05/21/2013  ? Abnormal urinalysis 05/21/2013  ? Hypokalemia 05/21/2013  ? Dehydration 05/21/2013  ? Coccydynia 05/07/2013  ? Anemia 08/06/2012  ? PUD (peptic ulcer disease) 02/06/2012  ? OVERWEIGHT/OBESITY 12/21/2010  ? Essential hypertension 12/29/2009  ? Asthma 12/29/2009  ? GERD 12/29/2009  ? Diaphragmatic hernia 12/29/2009  ? HEART MURMUR, SYSTOLIC 06/18/7627  ? ? ?Past Surgical History:  ?Procedure Laterality Date  ? APPENDECTOMY    ? CHOLECYSTECTOMY    ? COLONOSCOPY    ? COLONOSCOPY N/A 09/26/2013  ? Procedure: COLONOSCOPY;  Surgeon: Rogene Houston, MD;  [facility];  Service: Endoscopy;  Laterality: N/A;  1030-rescheduled to 12:00pm Lelon Frohlich notified pt  ? COLONOSCOPY N/A 03/27/2015  ? Procedure: COLONOSCOPY;  Surgeon: Rogene Houston, MD;  [facility];  Service: Endoscopy;  Laterality: N/A;  11:15 - moved to 8:25 - Ann to notify pt  ? ESOPHAGOGASTRODUODENOSCOPY  12/08/2011  ? Procedure: ESOPHAGOGASTRODUODENOSCOPY (EGD);  Surgeon: Rogene Houston, MD;  [facility];  Service: Endoscopy;  Laterality: N/A;  3:00   ? ESOPHAGOGASTRODUODENOSCOPY N/A 03/27/2015  ? Procedure: ESOPHAGOGASTRODUODENOSCOPY (EGD);  Surgeon: Rogene Houston, MD;  [facility];  Service: Endoscopy;  Laterality: N/A;  ? EYE SURGERY Bilateral   ? cataract surgery  ? GIVENS CAPSULE  STUDY N/A 11/04/2015  ? Procedure: GIVENS CAPSULE STUDY;  Surgeon: Rogene Houston, MD;  [facility];  Service: Endoscopy;  Laterality: N/A;  ? Hysterscopy    ? KNEE ARTHROSCOPY    ? Left  ? NECK SURGERY    ? x 2   ? TONSILLECTOMY    ? TOTAL KNEE ARTHROPLASTY  07/11/2012  ? Procedure: TOTAL KNEE ARTHROPLASTY;  Surgeon: Ninetta Lights, MD;  Location: [facility];  Service: Orthopedics;  Laterality: Left;  left total knee arthroplasty  ? UPPER GASTROINTESTINAL ENDOSCOPY    ? YAG LASER APPLICATION Right 7/00/1749  ? Procedure: YAG LASER APPLICATION;  Surgeon: Rutherford Guys, MD;   Location: [facility];  Service: Ophthalmology;  Laterality: Right;  ? ? ?OB History   ? ? Gravida  ?1  ? Para  ?   ? Term  ?   ? Preterm  ?   ? AB  ?   ? Living  ?1  ?  ? ? SAB  ?   ? IAB  ?   ? Ectopic  ?   ? Multiple  ?   ? Live Births  ?   ?   ?  ?  ? ? ? ?Home Medications   ? ?Prior to Admission medications   ?Medication Sig Start Date End Date Taking? Authorizing Provider  ?acetaminophen (TYLENOL) 500 MG tablet Take 500 mg by mouth as needed.    [provider]  ?Cholecalciferol (VITAMIN D) 2000 UNITS CAPS Take 1 capsule by mouth daily.    [provider]  ?diazepam (VALIUM) 5 MG tablet Take 5 mg by mouth every 8 (eight) hours as needed for anxiety.    [provider]  ?dicyclomine (BENTYL) 10 MG capsule Take 1 capsule (10 mg total) by mouth 3 (three) times daily as needed for spasms. 09/28/21   Rogene Houston, MD  ?ferrous sulfate 325 (65 FE) MG tablet Take 1 tablet (325 mg total) by mouth daily with breakfast. 10/25/16   Rehman, Mechele Dawley, MD  ?gabapentin (NEURONTIN) 300 MG capsule Take 300 mg by mouth at bedtime.    [provider]  ?metFORMIN (GLUCOPHAGE) 500 MG tablet Take 500 mg by mouth 2 (two) times daily with a meal.     [provider]  ?montelukast (SINGULAIR) 10 MG tablet Take 10 mg by mouth every morning.    [provider]  ?omeprazole (PRILOSEC) 20 MG capsule TAKE 1 CAPSULE(20 MG) BY MOUTH TWICE DAILY BEFORE A MEAL 02/22/22   Rehman, Mechele Dawley, MD  ?OVER THE COUNTER MEDICATION Fiber Gummies 2 daily    [provider]  ?OVER THE COUNTER MEDICATION Tums as needed    [provider]  ?potassium chloride (KLOR-CON) 10 MEQ tablet TAKE 1 TABLET(10 MEQ) BY MOUTH DAILY 06/26/20   Laurine Blazer B, PA-C  ?rivaroxaban (XARELTO) 20 MG TABS tablet Take 20 mg by mouth daily with supper.    [provider]  ?TURMERIC PO Take 1 tablet by mouth daily.    [provider]  ?vitamin B-12 (CYANOCOBALAMIN) 500 MCG tablet Take 500 mcg  by mouth daily.    [provider]  ? ? ?Family History ?Family History  ?Problem Relation Age of Onset  ? Hypertension Son   ? Alzheimer's disease Mother   ? Kidney disease Mother   ? Kidney disease Father   ? Cancer Maternal Aunt   ?     leukemia  ? Liver disease Maternal Uncle   ?  Kidney disease Paternal Uncle   ?     kidney removed  ? Diabetes Maternal Grandmother   ? Diabetes Maternal Grandfather   ? Cancer Paternal Grandmother   ?     breast, liver  ? Colon cancer Neg Hx   ? ? ?Social History ?Social History  ? ?Tobacco Use  ? Smoking status: Never  ?  Passive exposure: Never  ? Smokeless tobacco: Never  ?Vaping Use  ? Vaping Use: Never used  ?Substance Use Topics  ? Alcohol use: No  ?  Alcohol/week: 0.0 standard drinks  ? Drug use: No  ? ? ? ?Allergies   ?Nsaids, Cephalexin, Darvocet [propoxyphene n-acetaminophen], Percocet [oxycodone-acetaminophen], and Penicillins ? ? ?Review of Systems ?Review of Systems ?Per HPI ? ?Physical Exam ?Triage Vital Signs ?ED Triage Vitals  ?Enc Vitals Group  ?   BP 03/23/22 1522 (!) 157/84  ?   Pulse Rate 03/23/22 1522 88  ?   Resp 03/23/22 1522 18  ?   Temp 03/23/22 1522 97.6 ?F (36.4 ?C)  ?   Temp Source 03/23/22 1522 Oral  ?   SpO2 03/23/22 1522 92 %  ?   Weight --   ?   Height --   ?   Head Circumference --   ?   Peak Flow --   ?   Pain Score 03/23/22 1524 6  ?   Pain Loc --   ?   Pain Edu? --   ?   Excl. in [place]? --   ? ?No data found. ? ?Updated Vital Signs ?BP (!) 157/84 (BP Location: Right Arm)   Pulse 88   Temp 97.6 ?F (36.4 ?C) (Oral)   Resp 18   SpO2 92%  ? ?Visual Acuity ?Right Eye Distance:   ?Left Eye Distance:   ?Bilateral Distance:   ? ?Right Eye Near:   ?Left Eye Near:    ?Bilateral Near:    ? ?Physical Exam ?Vitals and nursing note reviewed.  ?Constitutional:   ?   Appearance: Normal appearance. She is not ill-appearing.  ?HENT:  ?   Head: Atraumatic.  ?Eyes:  ?   Extraocular Movements: Extraocular movements intact.  ?   Conjunctiva/sclera:  Conjunctivae normal.  ?Cardiovascular:  ?   Rate and Rhythm: Normal rate and regular rhythm.  ?   Heart sounds: Normal heart sounds.  ?Pulmonary:  ?   Effort: Pulmonary effort is normal.  ?   Breath sounds: Normal breath sou

## 2022-03-23 NOTE — ED Triage Notes (Signed)
[address] over a book case on last  Monday.  [place] [address] on a shelf that was sticking up.  Hurts to sit ?

## 2022-03-25 DIAGNOSIS — Z20822 Contact with and (suspected) exposure to covid-19: Secondary | ICD-10-CM | POA: Diagnosis not present

## 2022-04-26 ENCOUNTER — Telehealth (INDEPENDENT_AMBULATORY_CARE_PROVIDER_SITE_OTHER): Payer: Self-pay | Admitting: Internal Medicine

## 2022-04-26 NOTE — Telephone Encounter (Signed)
Patient came by office stated the medication prescribed on her last visit is not helping wanted to let Dr Laural Golden know - please advise - ph# [phone] ?

## 2022-04-26 NOTE — Telephone Encounter (Signed)
Last seen 2/28. Pain in lower abdomen all the way across, bloating, gas, cramps, severe diarrhea but does not take anything because then she has constipation. States bentyl is not helping. Takes tid.  ? ?[facility] [address]  ?

## 2022-04-27 ENCOUNTER — Other Ambulatory Visit (INDEPENDENT_AMBULATORY_CARE_PROVIDER_SITE_OTHER): Payer: Self-pay | Admitting: *Deleted

## 2022-04-27 DIAGNOSIS — R197 Diarrhea, unspecified: Secondary | ICD-10-CM

## 2022-04-27 NOTE — Telephone Encounter (Signed)
Per Dr. Laural Golden - take imodium '2mg'$  bid, do GI path and if has been on recent antibiotic then do c diff also.  ?

## 2022-04-27 NOTE — Telephone Encounter (Signed)
Called and discussed with patient and patient states she has not been on recent antibiotics and order put in for gi path. Pt vebalized understanding of all.  ?

## 2022-05-03 DIAGNOSIS — R197 Diarrhea, unspecified: Secondary | ICD-10-CM | POA: Diagnosis not present

## 2022-05-03 DIAGNOSIS — K529 Noninfective gastroenteritis and colitis, unspecified: Secondary | ICD-10-CM | POA: Diagnosis not present

## 2022-05-03 DIAGNOSIS — A049 Bacterial intestinal infection, unspecified: Secondary | ICD-10-CM | POA: Diagnosis not present

## 2022-05-05 LAB — GASTROINTESTINAL PATHOGEN PNL
CampyloBacter Group: NOT DETECTED
Norovirus GI/GII: NOT DETECTED
Rotavirus A: NOT DETECTED
Salmonella species: NOT DETECTED
Shiga Toxin 1: NOT DETECTED
Shiga Toxin 2: NOT DETECTED
Shigella Species: NOT DETECTED
Vibrio Group: NOT DETECTED
Yersinia enterocolitica: NOT DETECTED

## 2022-05-16 ENCOUNTER — Telehealth (INDEPENDENT_AMBULATORY_CARE_PROVIDER_SITE_OTHER): Payer: Self-pay | Admitting: *Deleted

## 2022-05-16 NOTE — Telephone Encounter (Signed)
Copied and pasted Dr. Olevia Perches note below:  GI pathogen panel is negative Results given to patient. She is starting to feel better but not at baseline. Patient advised to call us middle of next week.  If pain and diarrhea persist will proceed with diagnostic colonoscopy  Pt left message that she is still having a lot of pain, mostly on right side, bloating all the time, uncontrollable diarrhea 3 times since talking with Dr. Laural Golden.

## 2022-05-18 ENCOUNTER — Telehealth (INDEPENDENT_AMBULATORY_CARE_PROVIDER_SITE_OTHER): Payer: Self-pay

## 2022-05-18 ENCOUNTER — Encounter (INDEPENDENT_AMBULATORY_CARE_PROVIDER_SITE_OTHER): Payer: Self-pay

## 2022-05-18 ENCOUNTER — Other Ambulatory Visit (INDEPENDENT_AMBULATORY_CARE_PROVIDER_SITE_OTHER): Payer: Self-pay

## 2022-05-18 DIAGNOSIS — R1011 Right upper quadrant pain: Secondary | ICD-10-CM

## 2022-05-18 DIAGNOSIS — R197 Diarrhea, unspecified: Secondary | ICD-10-CM

## 2022-05-18 MED ORDER — PEG 3350-KCL-NA BICARB-NACL 420 G PO SOLR: 4000.0000 mL | ORAL | 0 refills | Status: DC

## 2022-05-18 NOTE — Telephone Encounter (Signed)
Pasquale Matters Ann Johnmichael Melhorn, CMA  ?

## 2022-05-18 NOTE — Telephone Encounter (Signed)
Per Dr. Laural Golden take imodium on schedule one tid and schedule colonoscopy. Stop imodium 2 days prior to prep. I called and discussed with patient and she verbalized understanding.   Serena Colonel, Patient prefers Dr. Laural Golden to do colonoscopy if he has any openings left.

## 2022-05-19 ENCOUNTER — Encounter (INDEPENDENT_AMBULATORY_CARE_PROVIDER_SITE_OTHER): Payer: Self-pay

## 2022-05-27 NOTE — Patient Instructions (Signed)
   Your procedure is scheduled on: 06/01/2022  Report to [facility] at     8:30AM.  Call this number if you have problems the morning of surgery: [phone]   Remember:              Follow Directions on the letter you received from Your Physician's office regarding the Bowel Prep              No Smoking the day of Procedure :   Take these medicines the morning of surgery with A SIP OF WATER: Valium, Gabapentin, Singulair, and omeprazole  Hold Xarelto 2 days as instructed in letter  No diabetic medication am of procedure   Do not wear jewelry, make-up or nail polish.    Do not bring valuables to the hospital.  Contacts, dentures or bridgework may not be worn into surgery.  .   Patients discharged the day of surgery will not be allowed to drive home.     Colonoscopy, Adult, Care After This sheet gives you information about how to care for yourself after your procedure. Your health care provider may also give you more specific instructions. If you have problems or questions, contact your health care provider. What can I expect after the procedure? After the procedure, it is common to have: A small amount of blood in your stool for 24 hours after the procedure. Some gas. Mild abdominal cramping or bloating.  Follow these instructions at home: General instructions  For the first 24 hours after the procedure: Do not drive or use machinery. Do not sign important documents. Do not drink alcohol. Do your regular daily activities at a slower pace than normal. Eat soft, easy-to-digest foods. Rest often. Take over-the-counter or prescription medicines only as told by your health care provider. It is up to you to get the results of your procedure. Ask your health care provider, or the department performing the procedure, when your results will be ready. Relieving cramping and bloating Try walking around when you have cramps or feel bloated. Apply heat to your abdomen  as told by your health care provider. Use a heat source that your health care provider recommends, such as a moist heat pack or a heating pad. Place a towel between your skin and the heat source. Leave the heat on for 20-30 minutes. Remove the heat if your skin turns bright red. This is especially important if you are unable to feel pain, heat, or cold. You may have a greater risk of getting burned. Eating and drinking Drink enough fluid to keep your urine clear or pale yellow. Resume your normal diet as instructed by your health care provider. Avoid heavy or fried foods that are hard to digest. Avoid drinking alcohol for as long as instructed by your health care provider. Contact a health care provider if: You have blood in your stool 2-3 days after the procedure. Get help right away if: You have more than a small spotting of blood in your stool. You pass large blood clots in your stool. Your abdomen is swollen. You have nausea or vomiting. You have a fever. You have increasing abdominal pain that is not relieved with medicine. This information is not intended to replace advice given to you by your health care provider. Make sure you discuss any questions you have with your health care provider. Document Released: 07/26/2004 Document Revised: 09/05/2016 Document Reviewed: 02/23/2016 Elsevier Interactive Patient Education  [employer].

## 2022-05-30 ENCOUNTER — Encounter (HOSPITAL_COMMUNITY)
Admission: RE | Admit: 2022-05-30 | Discharge: 2022-05-30 | Disposition: A | Discharge status: Home / Self Care | Payer: Medicare Other | Source: Ambulatory Visit | Attending: Internal Medicine | Admitting: Internal Medicine

## 2022-05-30 ENCOUNTER — Encounter (HOSPITAL_COMMUNITY): Payer: Self-pay

## 2022-05-30 VITALS — Ht 64.0 in | Wt 190.0 lb

## 2022-05-30 DIAGNOSIS — I10 Essential (primary) hypertension: Secondary | ICD-10-CM

## 2022-05-30 HISTORY — DX: Personal history of urinary calculi: Z87.442

## 2022-06-01 ENCOUNTER — Encounter (HOSPITAL_COMMUNITY)
Admission: RE | Disposition: A | Discharge status: Home / Self Care | Payer: Self-pay | Source: Home / Self Care | Attending: Internal Medicine

## 2022-06-01 ENCOUNTER — Ambulatory Visit (HOSPITAL_COMMUNITY): Payer: Medicare Other | Admitting: Anesthesiology

## 2022-06-01 ENCOUNTER — Ambulatory Visit (HOSPITAL_BASED_OUTPATIENT_CLINIC_OR_DEPARTMENT_OTHER): Payer: Medicare Other | Admitting: Anesthesiology

## 2022-06-01 ENCOUNTER — Encounter (INDEPENDENT_AMBULATORY_CARE_PROVIDER_SITE_OTHER): Payer: Self-pay | Admitting: *Deleted

## 2022-06-01 ENCOUNTER — Ambulatory Visit (HOSPITAL_COMMUNITY)
Admission: RE | Admit: 2022-06-01 | Discharge: 2022-06-01 | Disposition: A | Discharge status: Home / Self Care | Payer: Medicare Other | Attending: Internal Medicine | Admitting: Internal Medicine

## 2022-06-01 DIAGNOSIS — I10 Essential (primary) hypertension: Secondary | ICD-10-CM

## 2022-06-01 DIAGNOSIS — K6289 Other specified diseases of anus and rectum: Secondary | ICD-10-CM | POA: Diagnosis not present

## 2022-06-01 DIAGNOSIS — K644 Residual hemorrhoidal skin tags: Secondary | ICD-10-CM | POA: Diagnosis not present

## 2022-06-01 DIAGNOSIS — R197 Diarrhea, unspecified: Secondary | ICD-10-CM | POA: Diagnosis not present

## 2022-06-01 DIAGNOSIS — E119 Type 2 diabetes mellitus without complications: Secondary | ICD-10-CM | POA: Insufficient documentation

## 2022-06-01 DIAGNOSIS — K5730 Diverticulosis of large intestine without perforation or abscess without bleeding: Secondary | ICD-10-CM | POA: Diagnosis not present

## 2022-06-01 DIAGNOSIS — Z7901 Long term (current) use of anticoagulants: Secondary | ICD-10-CM | POA: Diagnosis not present

## 2022-06-01 DIAGNOSIS — R1011 Right upper quadrant pain: Secondary | ICD-10-CM

## 2022-06-01 DIAGNOSIS — K639 Disease of intestine, unspecified: Secondary | ICD-10-CM | POA: Diagnosis not present

## 2022-06-01 DIAGNOSIS — K219 Gastro-esophageal reflux disease without esophagitis: Secondary | ICD-10-CM | POA: Diagnosis not present

## 2022-06-01 DIAGNOSIS — F419 Anxiety disorder, unspecified: Secondary | ICD-10-CM | POA: Insufficient documentation

## 2022-06-01 HISTORY — PX: COLONOSCOPY WITH PROPOFOL: SHX5780

## 2022-06-01 HISTORY — PX: BIOPSY: SHX5522

## 2022-06-01 LAB — GLUCOSE, CAPILLARY: Glucose-Capillary: 120 mg/dL — ABNORMAL HIGH (ref 70–99)

## 2022-06-01 LAB — HM COLONOSCOPY

## 2022-06-01 SURGERY — COLONOSCOPY WITH PROPOFOL
Anesthesia: General

## 2022-06-01 MED ORDER — LIDOCAINE HCL (CARDIAC) PF 50 MG/5ML IV SOSY
PREFILLED_SYRINGE | INTRAVENOUS | Status: DC | PRN
  Administered 2022-06-01: 50 mg via INTRAVENOUS

## 2022-06-01 MED ORDER — LACTATED RINGERS IV SOLN
INTRAVENOUS | Status: DC
Start: 2022-06-01 — End: 2022-06-01

## 2022-06-01 MED ORDER — STERILE WATER FOR IRRIGATION IR SOLN
Status: DC | PRN
  Administered 2022-06-01: 50 mL

## 2022-06-01 MED ORDER — PROPOFOL 10 MG/ML IV BOLUS
INTRAVENOUS | Status: DC | PRN
  Administered 2022-06-01: 100 mg via INTRAVENOUS
  Administered 2022-06-01: 20 mg via INTRAVENOUS

## 2022-06-01 MED ORDER — PROPOFOL 500 MG/50ML IV EMUL
INTRAVENOUS | Status: DC | PRN
  Administered 2022-06-01: 120 ug/kg/min via INTRAVENOUS

## 2022-06-01 NOTE — Anesthesia Preprocedure Evaluation (Signed)
Anesthesia Evaluation  Patient identified by MRN, date of birth, ID band Patient awake    Reviewed: Allergy & Precautions, H&P , NPO status , Patient's Chart, lab work & pertinent test results, reviewed documented beta blocker date and time   Airway Mallampati: II  TM Distance: >3 FB Neck ROM: full    Dental no notable dental hx.    Pulmonary neg pulmonary ROS,    Pulmonary exam normal breath sounds clear to auscultation       Cardiovascular Exercise Tolerance: Good hypertension,  Rhythm:regular Rate:Normal     Neuro/Psych PSYCHIATRIC DISORDERS Anxiety  Neuromuscular disease    GI/Hepatic Neg liver ROS, hiatal hernia, PUD, GERD  ,  Endo/Other  negative endocrine ROSdiabetes, Type 2  Renal/GU negative Renal ROS  negative genitourinary   Musculoskeletal   Abdominal   Peds  Hematology  (+) Blood dyscrasia, anemia ,   Anesthesia Other Findings   Reproductive/Obstetrics negative OB ROS                             Anesthesia Physical Anesthesia Plan  ASA: 3  Anesthesia Plan: General   Post-op Pain Management:    Induction:   PONV Risk Score and Plan: Propofol infusion  Airway Management Planned:   Additional Equipment:   Intra-op Plan:   Post-operative Plan:   Informed Consent: I have reviewed the patients History and Physical, chart, labs and discussed the procedure including the risks, benefits and alternatives for the proposed anesthesia with the patient or authorized representative who has indicated his/her understanding and acceptance.     Dental Advisory Given  Plan Discussed with: CRNA  Anesthesia Plan Comments:         Anesthesia Quick Evaluation

## 2022-06-01 NOTE — Op Note (Signed)
[facility] Patient Name: [name] Procedure Date: 06/01/2022 10:12 AM MRN: [id number] Date of Birth: [birth year] Attending MD: Hildred Laser , MD CSN: [id number] Age: 76 Admit Type: Outpatient Procedure:                Colonoscopy Indications:              Clinically significant diarrhea of unexplained                            origin Providers:                Hildred Laser, MD, Crystal Page, Rosina Lowenstein, RN Referring MD:             -Halford Chessman MD, MD Medicines:                Propofol per Anesthesia Complications:            No immediate complications. Estimated Blood Loss:     Estimated blood loss was minimal. Procedure:                Pre-Anesthesia Assessment:                           - Prior to the procedure, a History and Physical                            was performed, and patient medications and                            allergies were reviewed. The patient's tolerance of                            previous anesthesia was also reviewed. The risks                            and benefits of the procedure and the sedation                            options and risks were discussed with the patient.                            All questions were answered, and informed consent                            was obtained. Prior Anticoagulants: The patient                            last took Xarelto (rivaroxaban) 3 days prior to the                            procedure. ASA Grade Assessment: II - A patient                            with mild systemic disease. After reviewing the  risks and benefits, the patient was deemed in                            satisfactory condition to undergo the procedure.                           After obtaining informed consent, the colonoscope                            was passed under direct vision. Throughout the                            procedure, the patient's blood pressure, pulse, and                             oxygen saturations were monitored continuously. The                            PCF-HQ190L ([id number]) scope was introduced through                            the anus and advanced to the the cecum, identified                            by appendiceal orifice and ileocecal valve. The                            colonoscopy was performed without difficulty. The                            patient tolerated the procedure well. The quality                            of the bowel preparation was excellent. The                            ileocecal valve, appendiceal orifice, and rectum                            were photographed. Scope In: 10:33:52 AM Scope Out: 10:54:46 AM Scope Withdrawal Time: 0 hours 12 minutes 33 seconds  Total Procedure Duration: 0 hours 20 minutes 54 seconds  Findings:      The perianal and digital rectal examinations were normal.      A few diverticula were found in the sigmoid colon.      The exam was otherwise normal throughout the examined colon.      Biopsies for histology were taken with a cold forceps from the right       colon, descending colon and sigmoid colon for evaluation of microscopic       colitis. The pathology specimen was placed into Bottle Number 1.      External hemorrhoids were found during retroflexion. The hemorrhoids       were small.      Anal papilla(e) were hypertrophied. Impression:               -  Diverticulosis in the sigmoid colon.                           - External hemorrhoids.                           - Anal papilla(e) were hypertrophied.                           - Biopsies were taken with a cold forceps from the                            right colon, descending colon and sigmoid colon for                            evaluation of microscopic colitis. Moderate Sedation:      Per Anesthesia Care Recommendation:           - Patient has a contact number available for                            emergencies. The signs and  symptoms of potential                            delayed complications were discussed with the                            patient. Return to normal activities tomorrow.                            Written discharge instructions were provided to the                            patient.                           - Resume previous diet today.                           - Continue present medications.                           - Resume Xarelto (rivaroxaban) at prior dose                            tomorrow.                           - Await pathology results.                           - No repeat colonoscopy due to age and the absence                            of advanced adenomas. Procedure Code(s):        --- Professional ---  45380, Colonoscopy, flexible; with biopsy, single                            or multiple Diagnosis Code(s):        --- Professional ---                           K64.4, Residual hemorrhoidal skin tags                           K62.89, Other specified diseases of anus and rectum                           R19.7, Diarrhea, unspecified                           K57.30, Diverticulosis of large intestine without                            perforation or abscess without bleeding CPT copyright 2019 American Medical Association. All rights reserved. The codes documented in this report are preliminary and upon coder review may  be revised to meet current compliance requirements. Hildred Laser, MD Hildred Laser, MD 06/01/2022 11:03:20 AM This report has been signed electronically. Number of Addenda: 0

## 2022-06-01 NOTE — Discharge Instructions (Signed)
Resume Xarelto on 06/02/2022 Resume other medications as before Resume usual diet No driving for 24 hours Physician will call with biopsy results.

## 2022-06-01 NOTE — Anesthesia Procedure Notes (Signed)
Date/Time: 06/01/2022 10:28 AM Performed by: Vista Deck, CRNA Pre-anesthesia Checklist: Patient identified, Emergency Drugs available, Suction available, Timeout performed and Patient being monitored Patient Re-evaluated:Patient Re-evaluated prior to induction Oxygen Delivery Method: Nasal Cannula

## 2022-06-01 NOTE — Transfer of Care (Signed)
Immediate Anesthesia Transfer of Care Note  Patient: [name]  Procedure(s) Performed: COLONOSCOPY WITH PROPOFOL BIOPSY  Patient Location: Endoscopy Unit  Anesthesia Type:General  Level of Consciousness: awake and patient cooperative  Airway & Oxygen Therapy: Patient Spontanous Breathing  Post-op Assessment: Report given to RN and Post -op Vital signs reviewed and stable  Post vital signs: Reviewed and stable  Last Vitals:  Vitals Value Taken Time  BP 154/89 1100  Temp 97.7 1100  Pulse 100 1100  Resp 19 1100  SpO2 97 1100    Last Pain:  Vitals:   06/01/22 1028  PainSc: 0-No pain         Complications: No notable events documented.

## 2022-06-01 NOTE — H&P (Signed)
[name] is an 76 y.o. female.   Chief Complaint: Patient is here for colonoscopy. HPI: Patient is 76 year old Caucasian female who is undergoing diagnostic colonoscopy.  She has been having diarrhea for more than a month.  GI pathogen panel was negative.  Screening for celiac disease was also negative.  She remains with 2-3 stools per day.  For stool is formed or semiformed and subsequent stools are loose.  She has not experienced rectal bleeding or weight loss.  Family history is negative for IBD or colon cancer.  Last colonoscopy was in April 2016. 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  She is on Xarelto which on hold for the procedure.  Last dose was 3 days ago.  Past Medical History:  Diagnosis Date   Arthritis    Asthma    Carpal tunnel syndrome    Chronic anxiety    Gastric ulcer    GERD (gastroesophageal reflux disease)    H/O hiatal hernia    History of heart murmur in childhood    History of kidney stones    Iron deficiency anemia    Neuropathy    Following neck surgery.   Pulmonary embolus [name])    November 2020   Type 2 diabetes mellitus [facility])     Past Surgical History:  Procedure Laterality Date   APPENDECTOMY     CHOLECYSTECTOMY     COLONOSCOPY     COLONOSCOPY N/A 09/26/2013   Procedure: COLONOSCOPY;  Surgeon: Hailey Houston, MD;  Location: [facility];  Service: Endoscopy;  Laterality: N/A;  1030-rescheduled to 12:00pm Ann notified pt   COLONOSCOPY N/A 03/27/2015   Procedure: COLONOSCOPY;  Surgeon: Hailey Houston, MD;  Location: [facility];  Service: Endoscopy;  Laterality: N/A;  11:15 - moved to 8:25 - Ann to notify pt   ESOPHAGOGASTRODUODENOSCOPY  12/08/2011   Procedure: ESOPHAGOGASTRODUODENOSCOPY (EGD);  Surgeon: Hailey Houston, MD;  Location: [facility];  Service: Endoscopy;  Laterality: N/A;  3:00    ESOPHAGOGASTRODUODENOSCOPY N/A 03/27/2015   Procedure: ESOPHAGOGASTRODUODENOSCOPY (EGD);  Surgeon: Hailey Houston,  MD;  Location: [facility];  Service: Endoscopy;  Laterality: N/A;   EYE SURGERY Bilateral    cataract surgery   GIVENS CAPSULE STUDY N/A 11/04/2015   Procedure: GIVENS CAPSULE STUDY;  Surgeon: Hailey Houston, MD;  Location: [facility];  Service: Endoscopy;  Laterality: N/A;   Hysterscopy     KNEE ARTHROSCOPY     Left   NECK SURGERY     x 2    TONSILLECTOMY     TOTAL KNEE ARTHROPLASTY  07/11/2012   Procedure: TOTAL KNEE ARTHROPLASTY;  Surgeon: Hailey Lights, MD;  Location: [facility];  Service: Orthopedics;  Laterality: Left;  left total knee arthroplasty   UPPER GASTROINTESTINAL ENDOSCOPY     YAG LASER APPLICATION Right 1/88/4166   Procedure: YAG LASER APPLICATION;  Surgeon: Hailey Guys, MD;  Location: [facility];  Service: Ophthalmology;  Laterality: Right;    Family History  Problem Relation Age of Onset   Hypertension Son    Alzheimer's disease Mother    Kidney disease Mother    Kidney disease Father    Cancer Maternal Aunt        leukemia   Liver disease Maternal Uncle    Kidney disease Paternal Uncle        kidney removed   Diabetes Maternal Grandmother    Diabetes Maternal Grandfather    Cancer Paternal Grandmother  breast, liver   Colon cancer Neg Hx    Social History:  reports that she has never smoked. She has never been exposed to tobacco smoke. She has never used smokeless tobacco. She reports that she does not drink alcohol and does not use drugs.  Allergies:  Allergies  Allergen Reactions   Nsaids Other (See Comments)    Caused Ulcers.   Cephalexin     unknown   Darvocet [Propoxyphene N-Acetaminophen] Nausea Only   Percocet [Oxycodone-Acetaminophen] Nausea Only   Penicillins Rash and Other (See Comments)    Has patient had a PCN reaction causing immediate rash, facial/tongue/throat swelling, SOB or lightheadedness with hypotension: Yes Has patient had a PCN reaction causing severe rash involving mucus membranes or skin necrosis: No Has patient had a  PCN reaction that required hospitalization No Has patient had a PCN reaction occurring within the last 10 years: Yes If all of the above answers are "NO", then may proceed with Cephalosporin use.     Medications Prior to Admission  Medication Sig Dispense Refill   acetaminophen (TYLENOL) 500 MG tablet Take 500 mg by mouth every 6 (six) hours as needed for moderate pain or headache.     Cholecalciferol (VITAMIN D) 2000 UNITS CAPS Take 2,000 Units by mouth daily.     diazepam (VALIUM) 5 MG tablet Take 5 mg by mouth every 8 (eight) hours as needed for anxiety.     dicyclomine (BENTYL) 20 MG tablet Take 20 mg by mouth 4 (four) times daily as needed for spasms.     ferrous sulfate 325 (65 FE) MG tablet Take 1 tablet (325 mg total) by mouth daily with breakfast.     gabapentin (NEURONTIN) 300 MG capsule Take 300 mg by mouth at bedtime.     metFORMIN (GLUCOPHAGE) 500 MG tablet Take 500 mg by mouth 2 (two) times daily with a meal.      montelukast (SINGULAIR) 10 MG tablet Take 10 mg by mouth every morning.     omeprazole (PRILOSEC) 20 MG capsule TAKE 1 CAPSULE(20 MG) BY MOUTH TWICE DAILY BEFORE A MEAL 180 capsule 3   POTASSIUM PO Take 1 tablet by mouth daily.     rivaroxaban (XARELTO) 20 MG TABS tablet Take 20 mg by mouth daily with supper.     TURMERIC PO Take 1 tablet by mouth daily.     vitamin B-12 (CYANOCOBALAMIN) 500 MCG tablet Take 500 mcg by mouth daily.     dicyclomine (BENTYL) 10 MG capsule Take 1 capsule (10 mg total) by mouth 3 (three) times daily as needed for spasms. (Patient not taking: Reported on 05/25/2022) 90 capsule 2   polyethylene glycol-electrolytes (TRILYTE) 420 g solution Take 4,000 mLs by mouth as directed. 4000 mL 0   potassium chloride (KLOR-CON) 10 MEQ tablet TAKE 1 TABLET(10 MEQ) BY MOUTH DAILY (Patient not taking: Reported on 05/25/2022) 90 tablet 0    Results for orders placed or performed during the hospital encounter of 06/01/22 (from the past 48 hour(s))  Glucose,  capillary     Status: Abnormal   Collection Time: 06/01/22  8:49 AM  Result Value Ref Range   Glucose-Capillary 120 (H) 70 - 99 mg/dL    Comment: Glucose reference range applies only to samples taken after fasting for at least 8 hours.   No results found.  Review of Systems  Blood pressure (!) 172/87, pulse 86, temperature 98.4 F (36.9 C), resp. rate (!) 24, SpO2 96 %. Physical Exam HENT:  Mouth/Throat:     Mouth: Mucous membranes are moist.     Pharynx: Oropharynx is clear.  Eyes:     General: No scleral icterus.    Conjunctiva/sclera: Conjunctivae normal.  Cardiovascular:     Rate and Rhythm: Normal rate and regular rhythm.     Heart sounds: Normal heart sounds. No murmur heard. Pulmonary:     Effort: Pulmonary effort is normal.     Breath sounds: Normal breath sounds.  Abdominal:     General: There is no distension.     Palpations: Abdomen is soft. There is no mass.     Tenderness: There is no abdominal tenderness.  Musculoskeletal:        General: No swelling.     Cervical back: Neck supple.  Lymphadenopathy:     Cervical: No cervical adenopathy.  Skin:    General: Skin is warm and dry.  Neurological:     Mental Status: She is alert.     Assessment/Plan  Unexplained diarrhea. Diagnostic colonoscopy.  Hildred Laser, MD 06/01/2022, 10:23 AM

## 2022-06-02 LAB — SURGICAL PATHOLOGY

## 2022-06-02 NOTE — Anesthesia Postprocedure Evaluation (Signed)
Anesthesia Post Note  Patient: ALFONSO SHACKETT  Procedure(s) Performed: COLONOSCOPY WITH PROPOFOL BIOPSY  Patient location during evaluation: Phase II Anesthesia Type: General Level of consciousness: awake Pain management: pain level controlled Vital Signs Assessment: post-procedure vital signs reviewed and stable Respiratory status: spontaneous breathing and respiratory function stable Cardiovascular status: blood pressure returned to baseline and stable Postop Assessment: no headache and no apparent nausea or vomiting Anesthetic complications: no Comments: Late entry   No notable events documented.   Last Vitals:  Vitals:   06/01/22 0915 06/01/22 1100  BP:  (!) 154/89  Pulse: 86 (!) 110  Resp: (!) 24 (!) 26  Temp:  36.5 C  SpO2: 96% 97%    Last Pain:  Vitals:   06/01/22 1100  TempSrc: Oral  PainSc: 0-No pain                 Louann Sjogren

## 2022-06-07 DIAGNOSIS — E1165 Type 2 diabetes mellitus with hyperglycemia: Secondary | ICD-10-CM | POA: Diagnosis not present

## 2022-06-07 DIAGNOSIS — E669 Obesity, unspecified: Secondary | ICD-10-CM | POA: Diagnosis not present

## 2022-06-07 DIAGNOSIS — D509 Iron deficiency anemia, unspecified: Secondary | ICD-10-CM | POA: Diagnosis not present

## 2022-06-07 DIAGNOSIS — E7849 Other hyperlipidemia: Secondary | ICD-10-CM | POA: Diagnosis not present

## 2022-06-07 DIAGNOSIS — I10 Essential (primary) hypertension: Secondary | ICD-10-CM | POA: Diagnosis not present

## 2022-06-07 DIAGNOSIS — E782 Mixed hyperlipidemia: Secondary | ICD-10-CM | POA: Diagnosis not present

## 2022-06-07 DIAGNOSIS — Z6835 Body mass index (BMI) 35.0-35.9, adult: Secondary | ICD-10-CM | POA: Diagnosis not present

## 2022-06-07 DIAGNOSIS — E1140 Type 2 diabetes mellitus with diabetic neuropathy, unspecified: Secondary | ICD-10-CM | POA: Diagnosis not present

## 2022-06-08 ENCOUNTER — Encounter (HOSPITAL_COMMUNITY): Payer: Self-pay | Admitting: Internal Medicine

## 2022-07-29 DIAGNOSIS — U071 COVID-19: Secondary | ICD-10-CM | POA: Diagnosis not present

## 2022-07-29 DIAGNOSIS — E6609 Other obesity due to excess calories: Secondary | ICD-10-CM | POA: Diagnosis not present

## 2022-07-29 DIAGNOSIS — I10 Essential (primary) hypertension: Secondary | ICD-10-CM | POA: Diagnosis not present

## 2022-08-09 DIAGNOSIS — E6609 Other obesity due to excess calories: Secondary | ICD-10-CM | POA: Diagnosis not present

## 2022-08-09 DIAGNOSIS — U099 Post covid-19 condition, unspecified: Secondary | ICD-10-CM | POA: Diagnosis not present

## 2022-08-09 DIAGNOSIS — Z6835 Body mass index (BMI) 35.0-35.9, adult: Secondary | ICD-10-CM | POA: Diagnosis not present

## 2022-08-22 ENCOUNTER — Encounter (INDEPENDENT_AMBULATORY_CARE_PROVIDER_SITE_OTHER): Payer: Self-pay | Admitting: Gastroenterology

## 2022-08-22 ENCOUNTER — Ambulatory Visit (INDEPENDENT_AMBULATORY_CARE_PROVIDER_SITE_OTHER): Payer: Medicare Other | Admitting: Gastroenterology

## 2022-08-22 VITALS — BP 132/77 | HR 105 | Temp 97.7°F | Ht 63.0 in | Wt 197.3 lb

## 2022-08-22 DIAGNOSIS — K219 Gastro-esophageal reflux disease without esophagitis: Secondary | ICD-10-CM | POA: Diagnosis not present

## 2022-08-22 NOTE — Patient Instructions (Signed)
Explained presumed etiology of IBS symptoms. Patient was counseled about the benefit of implementing a low FODMAP to improve symptoms and recurrent episodes. A dietary list was provided to the patient. Also, the patient was counseled about the benefit of avoiding stressing situations and potential environmental triggers leading to symptomatology. Start IBGard 1 tablet or edible peppermint/tea every 8-12 hours as needed for bloating Continue Imodium as needed for diarrhea

## 2022-08-22 NOTE — Progress Notes (Unsigned)
Maylon Peppers, M.D. Gastroenterology & Hepatology [facility] [address] [place], [place] [address]  Primary Care Physician: Sharilyn Sites, MD [address] [place] [address]  I will communicate my assessment and recommendations to the referring MD via EMR.  Problems: IBS-M GERD  History of Present Illness: [name] is a 76 y.o. female with past medical history of IBS, neuropathy, type 2 diabetes, PE, asthma, GERD, hiatal hernia, kidney stones, who presents for follow up of episodes of intermittent diarrhea.  The patient was last seen on 02/22/2022. At that time, the patient had celiac disease testing checked and GI pathogen ordered.  Celiac disease panel was negative.  She also had a GI pathogen panel which was normal.  She was advised to decrease omeprazole to 20 mg every day.  Patient reports that she is "still having the diarrhea attacks" once a week. She describes having having chronic episodes of explosive diarrhea. This is large in amount but only happens once - this is large in amount and watery in nature. States that after her colonoscopy she has felt her symptoms are better, but still not resolved. States that she is having a BM a day. She is taking dicyclomine 10 mg as needed for abdominal pain episodes. Has bloating frequently which causes abdominal pain - this gets better when she is able to move her bowels.  She follows a low carb diet for diabetes.  She is taking omeprazole 20 mg twice a day which controls her GERD most of the time. She very seldom taking Tums for breakthrough episodes of heartburn.  The patient denies having any nausea, vomiting, fever, chills, hematochezia, melena, hematemesis, jaundice, pruritus. Has gained some weight as she was put on prednisone for COVID and sinusitis.  Last EGD: 2012 Moderate size sliding hiatal hernia. Erosive antral gastritis with two prepyloric ulcers and  patent pylorus. Suspect peptic ulcer disease secondary to NSAID therapy and this may also explain abnormal gastric emptying study.  Last Colonoscopy:06/01/2022 - Diverticulosis in the sigmoid colon. - External hemorrhoids. - Anal papilla(e) were hypertrophied. - Biopsies were taken with a cold forceps from the right colon, descending colon and sigmoid colon for evaluation of microscopic colitis.  Random colonic biopsies were negative.  Capsule endoscopy: 2016 This study reveals single ileal erosion along with erosive gastritis which may or may not be the source of occult GI bleed and iron deficiency anemia. Patient advised to resume ferrous sulfate 325 mg by mouth twice a day. Patient advised to call if she has rectal bleeding or melena. She will have CBC in 1 month.  Past Medical History: Past Medical History:  Diagnosis Date   Arthritis    Asthma    Carpal tunnel syndrome    Chronic anxiety    Gastric ulcer    GERD (gastroesophageal reflux disease)    H/O hiatal hernia    History of heart murmur in childhood    History of kidney stones    Iron deficiency anemia    Neuropathy    Following neck surgery.   Pulmonary embolus [facility])    November 2020   Type 2 diabetes mellitus [facility])     Past Surgical History: Past Surgical History:  Procedure Laterality Date   APPENDECTOMY     BIOPSY  06/01/2022   Procedure: BIOPSY;  Surgeon: Rogene Houston, MD;  [facility];  Service: Endoscopy;;   CHOLECYSTECTOMY     COLONOSCOPY     COLONOSCOPY N/A  09/26/2013   Procedure: COLONOSCOPY;  Surgeon: Rogene Houston, MD;  [facility];  Service: Endoscopy;  Laterality: N/A;  1030-rescheduled to 12:00pm Ann notified pt   COLONOSCOPY N/A 03/27/2015   Procedure: COLONOSCOPY;  Surgeon: Rogene Houston, MD;  [facility];  Service: Endoscopy;  Laterality: N/A;  11:15 - moved to 8:25 - Ann to notify pt   COLONOSCOPY WITH PROPOFOL N/A 06/01/2022   Procedure: COLONOSCOPY  WITH PROPOFOL;  Surgeon: Rogene Houston, MD;  [facility];  Service: Endoscopy;  Laterality: N/A;  1005 ASA 2   ESOPHAGOGASTRODUODENOSCOPY  12/08/2011   Procedure: ESOPHAGOGASTRODUODENOSCOPY (EGD);  Surgeon: Rogene Houston, MD;  [facility];  Service: Endoscopy;  Laterality: N/A;  3:00    ESOPHAGOGASTRODUODENOSCOPY N/A 03/27/2015   Procedure: ESOPHAGOGASTRODUODENOSCOPY (EGD);  Surgeon: Rogene Houston, MD;  [facility];  Service: Endoscopy;  Laterality: N/A;   EYE SURGERY Bilateral    cataract surgery   GIVENS CAPSULE STUDY N/A 11/04/2015   Procedure: GIVENS CAPSULE STUDY;  Surgeon: Rogene Houston, MD;  [facility];  Service: Endoscopy;  Laterality: N/A;   Hysterscopy     KNEE ARTHROSCOPY     Left   NECK SURGERY     x 2    TONSILLECTOMY     TOTAL KNEE ARTHROPLASTY  07/11/2012   Procedure: TOTAL KNEE ARTHROPLASTY;  Surgeon: Ninetta Lights, MD;  Location: [place];  Service: Orthopedics;  Laterality: Left;  left total knee arthroplasty   UPPER GASTROINTESTINAL ENDOSCOPY     YAG LASER APPLICATION Right 2/50/5397   Procedure: YAG LASER APPLICATION;  Surgeon: Rutherford Guys, MD;  Location: [facility];  Service: Ophthalmology;  Laterality: Right;    Family History: Family History  Problem Relation Age of Onset   Hypertension Son    Alzheimer's disease Mother    Kidney disease Mother    Kidney disease Father    Cancer Maternal Aunt        leukemia   Liver disease Maternal Uncle    Kidney disease Paternal Uncle        kidney removed   Diabetes Maternal Grandmother    Diabetes Maternal Grandfather    Cancer Paternal Grandmother        breast, liver   Colon cancer Neg Hx     Social History: Social History   Tobacco Use  Smoking Status Never   Passive exposure: Never  Smokeless Tobacco Never   Social History   Substance and Sexual Activity  Alcohol Use No   Alcohol/week: 0.0 standard drinks of alcohol   Social History   Substance  and Sexual Activity  Drug Use No    Allergies: Allergies  Allergen Reactions   Nsaids Other (See Comments)    Caused Ulcers.   Cephalexin     unknown   Darvocet [Propoxyphene N-Acetaminophen] Nausea Only   Percocet [Oxycodone-Acetaminophen] Nausea Only   Penicillins Rash and Other (See Comments)    Has patient had a PCN reaction causing immediate rash, facial/tongue/throat swelling, SOB or lightheadedness with hypotension: Yes Has patient had a PCN reaction causing severe rash involving mucus membranes or skin necrosis: No Has patient had a PCN reaction that required hospitalization No Has patient had a PCN reaction occurring within the last 10 years: Yes If all of the above answers are "NO", then may proceed with Cephalosporin use.     Medications: Current Outpatient Medications  Medication Sig Dispense Refill   acetaminophen (TYLENOL)  500 MG tablet Take 500 mg by mouth every 6 (six) hours as needed for moderate pain or headache.     Cholecalciferol (VITAMIN D) 2000 UNITS CAPS Take 2,000 Units by mouth daily.     diazepam (VALIUM) 5 MG tablet Take 5 mg by mouth every 8 (eight) hours as needed for anxiety.     dicyclomine (BENTYL) 20 MG tablet Take 20 mg by mouth 4 (four) times daily as needed for spasms.     ferrous sulfate 325 (65 FE) MG tablet Take 1 tablet (325 mg total) by mouth daily with breakfast.     Flaxseed, Linseed, (FLAX SEED OIL PO) Take by mouth. 1200 mg daily     gabapentin (NEURONTIN) 300 MG capsule Take 300 mg by mouth at bedtime.     metFORMIN (GLUCOPHAGE) 500 MG tablet Take 500 mg by mouth 2 (two) times daily with a meal.      montelukast (SINGULAIR) 10 MG tablet Take 10 mg by mouth every morning.     omeprazole (PRILOSEC) 20 MG capsule TAKE 1 CAPSULE(20 MG) BY MOUTH TWICE DAILY BEFORE A MEAL 180 capsule 3   OVER THE COUNTER MEDICATION K Gluconate once per day.     POTASSIUM PO Take 1 tablet by mouth daily.     rivaroxaban (XARELTO) 20 MG TABS tablet Take 20  mg by mouth daily with supper.     TURMERIC PO Take 1 tablet by mouth daily.     vitamin B-12 (CYANOCOBALAMIN) 500 MCG tablet Take 500 mcg by mouth daily.     No current facility-administered medications for this visit.    Review of Systems: GENERAL: negative for malaise, night sweats HEENT: No changes in hearing or vision, no nose bleeds or other nasal problems. NECK: Negative for lumps, goiter, pain and significant neck swelling RESPIRATORY: Negative for cough, wheezing CARDIOVASCULAR: Negative for chest pain, leg swelling, palpitations, orthopnea GI: SEE HPI MUSCULOSKELETAL: Negative for joint pain or swelling, back pain, and muscle pain. SKIN: Negative for lesions, rash PSYCH: Negative for sleep disturbance, mood disorder and recent psychosocial stressors. HEMATOLOGY Negative for prolonged bleeding, bruising easily, and swollen nodes. ENDOCRINE: Negative for cold or heat intolerance, polyuria, polydipsia and goiter. NEURO: negative for tremor, gait imbalance, syncope and seizures. The remainder of the review of systems is noncontributory.   Physical Exam: BP 132/77 (BP Location: Left Arm, Patient Position: Sitting, Cuff Size: Large)   Pulse (!) 105   Temp 97.7 F (36.5 C) (Oral)   Ht '5\' 3"'$  (1.6 m)   Wt 197 lb 4.8 oz (89.5 kg)   BMI 34.95 kg/m  GENERAL: The patient is AO x3, in no acute distress. Obese. HEENT: Head is normocephalic and atraumatic. EOMI are intact. Mouth is well hydrated and without lesions. NECK: Supple. No masses LUNGS: Clear to auscultation. No presence of rhonchi/wheezing/rales. Adequate chest expansion HEART: RRR, normal s1 and s2. ABDOMEN: mildly tender in R flank, no guarding, no peritoneal signs, and nondistended. BS +. No masses. EXTREMITIES: Without any cyanosis, clubbing, rash, lesions or edema. NEUROLOGIC: AOx3, no focal motor deficit. SKIN: no jaundice, no rashes  Imaging/Labs: as above  I personally reviewed and interpreted the available  labs, imaging and endoscopic files.  Impression and Plan: CHEVONNE BOSTROM is a 75 y.o. female with past medical history of IBS, neuropathy, type 2 diabetes, PE, asthma, GERD, hiatal hernia, kidney stones, who presents for follow up of episodes of intermittent diarrhea.  The patient has presented intermittent occasional episodes of diarrhea. Has presented  these symptoms chronically without red flag signs.  We discussed treating her symptoms with low FODMAP diet, the patient was provided dietary list, she will also benefit from peppermint she will continue Imodium as needed.  We discussed the possibility of using low-dose TCA but she would like to hold off on this for now.  Regarding her GERD, her symptoms are controlled with low-dose omeprazole which she should continue taking every day.  - Explained presumed etiology of IBS symptoms. Patient was counseled about the benefit of implementing a low FODMAP to improve symptoms and recurrent episodes. A dietary list was provided to the patient. Also, the patient was counseled about the benefit of avoiding stressing situations and potential environmental triggers leading to symptomatology. - Start IBGard 1 tablet or edible peppermint/tea every 8-12 hours as needed for bloating - Continue Imodium as needed for diarrhea -Continue omeprazole 20 mg every day All questions were answered.      Harvel Quale, MD Gastroenterology and Hepatology [facility]

## 2022-08-24 DIAGNOSIS — E119 Type 2 diabetes mellitus without complications: Secondary | ICD-10-CM | POA: Diagnosis not present

## 2022-08-24 DIAGNOSIS — Z961 Presence of intraocular lens: Secondary | ICD-10-CM | POA: Diagnosis not present

## 2022-08-24 DIAGNOSIS — H04123 Dry eye syndrome of bilateral lacrimal glands: Secondary | ICD-10-CM | POA: Diagnosis not present

## 2022-08-24 DIAGNOSIS — H52203 Unspecified astigmatism, bilateral: Secondary | ICD-10-CM | POA: Diagnosis not present

## 2022-08-24 DIAGNOSIS — H524 Presbyopia: Secondary | ICD-10-CM | POA: Diagnosis not present

## 2022-09-06 DIAGNOSIS — E782 Mixed hyperlipidemia: Secondary | ICD-10-CM | POA: Diagnosis not present

## 2022-09-06 DIAGNOSIS — M150 Primary generalized (osteo)arthritis: Secondary | ICD-10-CM | POA: Diagnosis not present

## 2022-09-06 DIAGNOSIS — E1140 Type 2 diabetes mellitus with diabetic neuropathy, unspecified: Secondary | ICD-10-CM | POA: Diagnosis not present

## 2022-09-06 DIAGNOSIS — Z6835 Body mass index (BMI) 35.0-35.9, adult: Secondary | ICD-10-CM | POA: Diagnosis not present

## 2022-09-06 DIAGNOSIS — J4530 Mild persistent asthma, uncomplicated: Secondary | ICD-10-CM | POA: Diagnosis not present

## 2022-09-06 DIAGNOSIS — Z23 Encounter for immunization: Secondary | ICD-10-CM | POA: Diagnosis not present

## 2022-09-06 DIAGNOSIS — E6609 Other obesity due to excess calories: Secondary | ICD-10-CM | POA: Diagnosis not present

## 2022-09-06 DIAGNOSIS — I10 Essential (primary) hypertension: Secondary | ICD-10-CM | POA: Diagnosis not present

## 2022-09-06 DIAGNOSIS — K589 Irritable bowel syndrome without diarrhea: Secondary | ICD-10-CM | POA: Diagnosis not present

## 2022-09-09 DIAGNOSIS — E119 Type 2 diabetes mellitus without complications: Secondary | ICD-10-CM | POA: Diagnosis not present

## 2022-10-04 ENCOUNTER — Other Ambulatory Visit: Payer: Self-pay | Admitting: Gastroenterology

## 2022-10-04 ENCOUNTER — Telehealth (INDEPENDENT_AMBULATORY_CARE_PROVIDER_SITE_OTHER): Payer: Self-pay

## 2022-10-04 DIAGNOSIS — K580 Irritable bowel syndrome with diarrhea: Secondary | ICD-10-CM

## 2022-10-04 MED ORDER — AMITRIPTYLINE HCL 10 MG PO TABS: 10.0000 mg | ORAL_TABLET | Freq: Every day | ORAL | 3 refills | Status: DC

## 2022-10-04 NOTE — Telephone Encounter (Signed)
Please ask her to take it at bedtime (30 minutes before) as it causes somnolence. Needs to take it every night. Can take the bentyl if she has abdominal pain episodes, but try to avoid it if it is only for diarrhea. Thanks

## 2022-10-04 NOTE — Telephone Encounter (Signed)
In her previous appointment we discussed the possibility of starting a low-dose TCA such as Elavil (amitriptyline).  She stated that she wanted to hold off on this at that moment.  If interested, I can send a prescription for this.

## 2022-10-04 NOTE — Telephone Encounter (Signed)
Yes send in please to [place].

## 2022-10-04 NOTE — Telephone Encounter (Signed)
Patient called today states she is still having issues with IBS diarrhea. She says every three to four days she has a large loose bowel movement, she has gas and bloating and has fecal incontinence.She says she is taking bentyl 20 mg bid she is taking imodium once per day. She is following a low FodMap diet, and also taking simethicone daily, and Ibgard bid.She is not under any more stress than normal. She has some upper abdominal pains at times. She has had a cholecystomy in the past. She says she has a friend whom is on Creon and that friend told her she had the same issues until she was started on the creon. I advised that creon is usually used for pancreatic insufficiency, and I saw nothing mentioned in her chart for this. Please advise, Is there something else the patient can try?

## 2022-10-05 NOTE — Telephone Encounter (Signed)
Patient made aware of all.  

## 2022-10-15 ENCOUNTER — Ambulatory Visit
Admission: EM | Admit: 2022-10-15 | Discharge: 2022-10-15 | Disposition: A | Discharge status: Home / Self Care | Payer: Medicare Other | Attending: Family Medicine | Admitting: Family Medicine

## 2022-10-15 ENCOUNTER — Other Ambulatory Visit: Payer: Self-pay

## 2022-10-15 ENCOUNTER — Encounter: Payer: Self-pay | Admitting: Emergency Medicine

## 2022-10-15 DIAGNOSIS — R319 Hematuria, unspecified: Secondary | ICD-10-CM

## 2022-10-15 DIAGNOSIS — Z7901 Long term (current) use of anticoagulants: Secondary | ICD-10-CM

## 2022-10-15 DIAGNOSIS — R109 Unspecified abdominal pain: Secondary | ICD-10-CM

## 2022-10-15 LAB — POCT URINALYSIS DIP (MANUAL ENTRY)
Bilirubin, UA: NEGATIVE
Glucose, UA: NEGATIVE mg/dL
Ketones, POC UA: NEGATIVE mg/dL
Nitrite, UA: NEGATIVE
Protein Ur, POC: 100 mg/dL — AB
Spec Grav, UA: 1.025 (ref 1.010–1.025)
Urobilinogen, UA: 0.2 E.U./dL
pH, UA: 7 (ref 5.0–8.0)

## 2022-10-15 MED ORDER — SULFAMETHOXAZOLE-TRIMETHOPRIM 800-160 MG PO TABS: 1.0000 | ORAL_TABLET | Freq: Two times a day (BID) | ORAL | 0 refills | Status: AC

## 2022-10-15 NOTE — ED Triage Notes (Signed)
Pt reports hematuria since last night and intermittent left lower back pain radiating to left lower quadrant of abdomen. Pt reports is currently taking xarelto.

## 2022-10-15 NOTE — Discharge Instructions (Signed)
You may be experiencing symptoms from a kidney stone, however we are unable to do a CT scan in the setting to confirm or deny this.  You do have a significant amount of blood in your urine at this moment but also some small amounts of bacteria so I am sending out for a urine culture and treating you for a possible urinary tract infection while we wait for this.  Drink plenty of fluids, rest and follow-up if your symptoms worsen prior to results coming back.

## 2022-10-16 LAB — URINE CULTURE: Culture: NO GROWTH

## 2022-10-16 NOTE — ED Provider Notes (Signed)
[facility]    CSN: [id number] Arrival date & time: 10/15/22  0801      History   Chief Complaint Chief Complaint  Patient presents with   Hematuria    HPI [name] is a 76 y.o. female.   Patient presenting today with 1 day history of intermittent waves of left lower back pain radiating to the left lower quadrant and hematuria.  Denies fever, chills, nausea, vomiting, dysuria, vaginal symptoms.  Has a history of kidney stones and states it feels somewhat similar.  Takes Xarelto daily with no prior issues with bleeding.  So far not trying anything over-the-counter for symptoms.    Past Medical History:  Diagnosis Date   Arthritis    Asthma    Carpal tunnel syndrome    Chronic anxiety    Gastric ulcer    GERD (gastroesophageal reflux disease)    H/O hiatal hernia    History of heart murmur in childhood    History of kidney stones    Iron deficiency anemia    Neuropathy    Following neck surgery.   Pulmonary embolus [facility])    November 2020   Type 2 diabetes mellitus [facility])     Patient Active Problem List   Diagnosis Date Noted   History of iron deficiency anemia 02/22/2022   IBS (irritable colon syndrome) 03/23/2021   Lower abdominal pain 03/23/2021   Well woman exam with routine gynecological exam 11/22/2017   Controlled type 2 diabetes mellitus ([place]) 11/02/2017   Nonspecific chest pain 11/02/2017   Bilateral leg edema    Diabetes ([place]) 03/28/2016   Pain in the chest    GERD (gastroesophageal reflux disease)    Chest pain 01/05/2015   Neuropathy 01/05/2015   Bronchitis    Guaiac positive hemoccult card on annual exam 05/08/2014   Chest pain at rest 11/14/2013   Left knee pain 09/18/2013   Salmonella 05/24/2013   Thrombocytopenia, unspecified ([place]) 05/24/2013   Acute gastroenteritis 05/21/2013   Fever 05/21/2013   Abnormal urinalysis 05/21/2013   Hypokalemia 05/21/2013   Dehydration 05/21/2013   Coccydynia 05/07/2013   Anemia  08/06/2012   PUD (peptic ulcer disease) 02/06/2012   OVERWEIGHT/OBESITY 12/21/2010   Essential hypertension 12/29/2009   Asthma 12/29/2009   GERD 12/29/2009   Diaphragmatic hernia 12/29/2009   HEART MURMUR, SYSTOLIC 29/52/8413    Past Surgical History:  Procedure Laterality Date   APPENDECTOMY     BIOPSY  06/01/2022   Procedure: BIOPSY;  Surgeon: Rogene Houston, MD;  [facility];  Service: Endoscopy;;   CHOLECYSTECTOMY     COLONOSCOPY     COLONOSCOPY N/A 09/26/2013   Procedure: COLONOSCOPY;  Surgeon: Rogene Houston, MD;  [facility];  Service: Endoscopy;  Laterality: N/A;  1030-rescheduled to 12:00pm Ann notified pt   COLONOSCOPY N/A 03/27/2015   Procedure: COLONOSCOPY;  Surgeon: Rogene Houston, MD;  [facility];  Service: Endoscopy;  Laterality: N/A;  11:15 - moved to 8:25 - Ann to notify pt   COLONOSCOPY WITH PROPOFOL N/A 06/01/2022   Procedure: COLONOSCOPY WITH PROPOFOL;  Surgeon: Rogene Houston, MD;  [facility];  Service: Endoscopy;  Laterality: N/A;  1005 ASA 2   ESOPHAGOGASTRODUODENOSCOPY  12/08/2011   Procedure: ESOPHAGOGASTRODUODENOSCOPY (EGD);  Surgeon: Rogene Houston, MD;  [facility];  Service: Endoscopy;  Laterality: N/A;  3:00    ESOPHAGOGASTRODUODENOSCOPY N/A 03/27/2015   Procedure: ESOPHAGOGASTRODUODENOSCOPY (EGD);  Surgeon: Rogene Houston, MD;  [facility];  Service: Endoscopy;  Laterality: N/A;   EYE SURGERY Bilateral    cataract surgery   GIVENS CAPSULE STUDY N/A 11/04/2015   Procedure: GIVENS CAPSULE STUDY;  Surgeon: Rogene Houston, MD;  [facility];  Service: Endoscopy;  Laterality: N/A;   Hysterscopy     KNEE ARTHROSCOPY     Left   NECK SURGERY     x 2    TONSILLECTOMY     TOTAL KNEE ARTHROPLASTY  07/11/2012   Procedure: TOTAL KNEE ARTHROPLASTY;  Surgeon: Ninetta Lights, MD;  Location: [place];  Service: Orthopedics;  Laterality: Left;  left total knee arthroplasty   UPPER  GASTROINTESTINAL ENDOSCOPY     YAG LASER APPLICATION Right 6/78/9381   Procedure: YAG LASER APPLICATION;  Surgeon: Rutherford Guys, MD;  Location: [facility];  Service: Ophthalmology;  Laterality: Right;    OB History     Gravida  1   Para      Term      Preterm      AB      Living  1      SAB      IAB      Ectopic      Multiple      Live Births               Home Medications    Prior to Admission medications   Medication Sig Start Date End Date Taking? Authorizing Provider  sulfamethoxazole-trimethoprim (BACTRIM DS) 800-160 MG tablet Take 1 tablet by mouth 2 (two) times daily for 3 days. 10/15/22 10/18/22 Yes Volney American, PA-C  acetaminophen (TYLENOL) 500 MG tablet Take 500 mg by mouth every 6 (six) hours as needed for moderate pain or headache.    [provider]  amitriptyline (ELAVIL) 10 MG tablet Take 1 tablet (10 mg total) by mouth at bedtime. 10/04/22   Harvel Quale, MD  Cholecalciferol (VITAMIN D) 2000 UNITS CAPS Take 2,000 Units by mouth daily.    [provider]  diazepam (VALIUM) 5 MG tablet Take 5 mg by mouth every 8 (eight) hours as needed for anxiety.    [provider]  dicyclomine (BENTYL) 20 MG tablet Take 20 mg by mouth 4 (four) times daily as needed for spasms.    [provider]  ferrous sulfate 325 (65 FE) MG tablet Take 1 tablet (325 mg total) by mouth daily with breakfast. 10/25/16   Rehman, Mechele Dawley, MD  Flaxseed, Linseed, (FLAX SEED OIL PO) Take by mouth. 1200 mg daily    [provider]  gabapentin (NEURONTIN) 300 MG capsule Take 300 mg by mouth at bedtime.    [provider]  metFORMIN (GLUCOPHAGE) 500 MG tablet Take 500 mg by mouth 2 (two) times daily with a meal.     [provider]  montelukast (SINGULAIR) 10 MG tablet Take 10 mg by mouth every morning.    [provider]  omeprazole (PRILOSEC) 20 MG capsule TAKE 1 CAPSULE(20 MG) BY MOUTH TWICE  DAILY BEFORE A MEAL 02/22/22   Rehman, Mechele Dawley, MD  OVER THE COUNTER MEDICATION K Gluconate once per day.    [provider]  POTASSIUM PO Take 1 tablet by mouth daily.    [provider]  rivaroxaban (XARELTO) 20 MG TABS tablet Take 20 mg by mouth daily with supper.    [provider]  TURMERIC PO Take 1 tablet by mouth daily.    [provider]  vitamin B-12 (CYANOCOBALAMIN) 500 MCG tablet Take 500 mcg by mouth daily.    [provider]    Family History Family History  Problem Relation Age of Onset   Hypertension Son    Alzheimer's disease Mother    Kidney disease Mother    Kidney disease Father    Cancer Maternal Aunt        leukemia   Liver disease Maternal Uncle    Kidney disease Paternal Uncle        kidney removed   Diabetes Maternal Grandmother    Diabetes Maternal Grandfather    Cancer Paternal Grandmother        breast, liver   Colon cancer Neg Hx     Social History Social History   Tobacco Use   Smoking status: Never    Passive exposure: Never   Smokeless tobacco: Never  Vaping Use   Vaping Use: Never used  Substance Use Topics   Alcohol use: No    Alcohol/week: 0.0 standard drinks of alcohol   Drug use: No     Allergies   Nsaids, Cephalexin, Darvocet [propoxyphene n-acetaminophen], Percocet [oxycodone-acetaminophen], and Penicillins   Review of Systems Review of Systems Per HPI  Physical Exam Triage Vital Signs ED Triage Vitals [10/15/22 0812]  Enc Vitals Group     BP (!) 160/84     Pulse Rate 83     Resp 20     Temp 98.5 F (36.9 C)     Temp Source Oral     SpO2 94 %     Weight      Height      Head Circumference      Peak Flow      Pain Score 6     Pain Loc      Pain Edu?      Excl. in [place]?    No data found.  Updated Vital Signs BP (!) 160/84 (BP Location: Right Arm)   Pulse 83   Temp 98.5 F (36.9 C) (Oral)   Resp 20   SpO2 94%   Visual Acuity Right Eye Distance:   Left Eye  Distance:   Bilateral Distance:    Right Eye Near:   Left Eye Near:    Bilateral Near:     Physical Exam Vitals and nursing note reviewed.  Constitutional:      Appearance: Normal appearance. She is not ill-appearing.  HENT:     Head: Atraumatic.  Eyes:     Extraocular Movements: Extraocular movements intact.     Conjunctiva/sclera: Conjunctivae normal.  Cardiovascular:     Rate and Rhythm: Normal rate and regular rhythm.     Heart sounds: Normal heart sounds.  Pulmonary:     Effort: Pulmonary effort is normal.     Breath sounds: Normal breath sounds.  Abdominal:     General: Bowel sounds are normal. There is no distension.     Palpations: Abdomen is soft.     Tenderness: There is no abdominal tenderness. There is no right CVA tenderness, left CVA tenderness or guarding.  Musculoskeletal:        General: Normal range of motion.     Cervical back: Normal range of motion and neck supple.  Skin:    General: Skin is warm and dry.  Neurological:     Mental Status: She is alert and oriented to person, place, and time.  Psychiatric:        Mood and Affect: Mood normal.  Thought Content: Thought content normal.        Judgment: Judgment normal.    UC Treatments / Results  Labs (all labs ordered are listed, but only abnormal results are displayed) Labs Reviewed  POCT URINALYSIS DIP (MANUAL ENTRY) - Abnormal; Notable for the following components:      Result Value   Color, UA brown (*)    Clarity, UA cloudy (*)    Blood, UA large (*)    Protein Ur, POC =100 (*)    Leukocytes, UA Trace (*)    All other components within normal limits  URINE CULTURE    EKG   Radiology No results found.  Procedures Procedures (including critical care time)  Medications Ordered in UC Medications - No data to display  Initial Impression / Assessment and Plan / UC Course  I have reviewed the triage vital signs and the nursing notes.  Pertinent labs & imaging results that  were available during my care of the patient were reviewed by me and considered in my medical decision making (see chart for details).     Urinalysis today showing possible urinary tract infection but also evidence of a kidney stone.  Discussed with patient that we are unable to rule this out today without ability to perform a CT scan and she states she thinks she already passed the stone if it was that.  Will cover for a urinary tract infection while awaiting urine culture for further rule out.  Discussed to push fluids, follow-up for worsening symptoms.  Final Clinical Impressions(s) / UC Diagnoses   Final diagnoses:  Hematuria, unspecified type  Flank pain  Chronic anticoagulation     Discharge Instructions      You may be experiencing symptoms from a kidney stone, however we are unable to do a CT scan in the setting to confirm or deny this.  You do have a significant amount of blood in your urine at this moment but also some small amounts of bacteria so I am sending out for a urine culture and treating you for a possible urinary tract infection while we wait for this.  Drink plenty of fluids, rest and follow-up if your symptoms worsen prior to results coming back.    ED Prescriptions     Medication Sig Dispense Auth. Provider   sulfamethoxazole-trimethoprim (BACTRIM DS) 800-160 MG tablet Take 1 tablet by mouth 2 (two) times daily for 3 days. 6 tablet Volney American, [place]      PDMP not reviewed this encounter.   Merrie Roof [place], [place] 10/16/22 [phone]

## 2022-10-28 ENCOUNTER — Emergency Department (HOSPITAL_COMMUNITY): Payer: Medicare Other

## 2022-10-28 ENCOUNTER — Emergency Department (HOSPITAL_COMMUNITY)
Admission: EM | Admit: 2022-10-28 | Discharge: 2022-10-28 | Disposition: A | Discharge status: Home / Self Care | Payer: Medicare Other | Attending: Emergency Medicine | Admitting: Emergency Medicine

## 2022-10-28 DIAGNOSIS — K449 Diaphragmatic hernia without obstruction or gangrene: Secondary | ICD-10-CM | POA: Diagnosis not present

## 2022-10-28 DIAGNOSIS — J45909 Unspecified asthma, uncomplicated: Secondary | ICD-10-CM | POA: Diagnosis not present

## 2022-10-28 DIAGNOSIS — M79602 Pain in left arm: Secondary | ICD-10-CM | POA: Insufficient documentation

## 2022-10-28 DIAGNOSIS — Z7901 Long term (current) use of anticoagulants: Secondary | ICD-10-CM | POA: Insufficient documentation

## 2022-10-28 DIAGNOSIS — Z7984 Long term (current) use of oral hypoglycemic drugs: Secondary | ICD-10-CM | POA: Diagnosis not present

## 2022-10-28 DIAGNOSIS — E119 Type 2 diabetes mellitus without complications: Secondary | ICD-10-CM | POA: Diagnosis not present

## 2022-10-28 DIAGNOSIS — R0789 Other chest pain: Secondary | ICD-10-CM | POA: Insufficient documentation

## 2022-10-28 DIAGNOSIS — K219 Gastro-esophageal reflux disease without esophagitis: Secondary | ICD-10-CM | POA: Diagnosis not present

## 2022-10-28 DIAGNOSIS — M25512 Pain in left shoulder: Secondary | ICD-10-CM | POA: Diagnosis not present

## 2022-10-28 DIAGNOSIS — R079 Chest pain, unspecified: Secondary | ICD-10-CM | POA: Diagnosis not present

## 2022-10-28 DIAGNOSIS — Z86711 Personal history of pulmonary embolism: Secondary | ICD-10-CM | POA: Insufficient documentation

## 2022-10-28 LAB — CBC WITH DIFFERENTIAL/PLATELET
Abs Immature Granulocytes: 0.04 10*3/uL (ref 0.00–0.07)
Basophils Absolute: 0.1 10*3/uL (ref 0.0–0.1)
Basophils Relative: 1 %
Eosinophils Absolute: 0.3 10*3/uL (ref 0.0–0.5)
Eosinophils Relative: 5 %
HCT: 41.4 % (ref 36.0–46.0)
Hemoglobin: 13.5 g/dL (ref 12.0–15.0)
Immature Granulocytes: 1 %
Lymphocytes Relative: 31 %
Lymphs Abs: 1.8 10*3/uL (ref 0.7–4.0)
MCH: 30.1 pg (ref 26.0–34.0)
MCHC: 32.6 g/dL (ref 30.0–36.0)
MCV: 92.4 fL (ref 80.0–100.0)
Monocytes Absolute: 0.4 10*3/uL (ref 0.1–1.0)
Monocytes Relative: 7 %
Neutro Abs: 3.3 10*3/uL (ref 1.7–7.7)
Neutrophils Relative %: 55 %
Platelets: 221 10*3/uL (ref 150–400)
RBC: 4.48 MIL/uL (ref 3.87–5.11)
RDW: 14.9 % (ref 11.5–15.5)
WBC: 5.9 10*3/uL (ref 4.0–10.5)
nRBC: 0 % (ref 0.0–0.2)

## 2022-10-28 LAB — BASIC METABOLIC PANEL
Anion gap: 7 (ref 5–15)
BUN: 18 mg/dL (ref 8–23)
CO2: 26 mmol/L (ref 22–32)
Calcium: 9.2 mg/dL (ref 8.9–10.3)
Chloride: 108 mmol/L (ref 98–111)
Creatinine, Ser: 0.99 mg/dL (ref 0.44–1.00)
GFR, Estimated: 59 mL/min — ABNORMAL LOW (ref >60)
Glucose, Bld: 99 mg/dL (ref 70–99)
Potassium: 4.2 mmol/L (ref 3.5–5.1)
Sodium: 141 mmol/L (ref 135–145)

## 2022-10-28 LAB — TROPONIN I (HIGH SENSITIVITY)
Troponin I (High Sensitivity): 2 ng/L (ref <18)
Troponin I (High Sensitivity): 3 ng/L (ref <18)

## 2022-10-28 MED ORDER — METHOCARBAMOL 500 MG PO TABS: 500.0000 mg | ORAL_TABLET | Freq: Three times a day (TID) | ORAL | 0 refills | Status: DC

## 2022-10-28 NOTE — ED Notes (Signed)
Pt reports 8/10 left arm/shoulder/scapular pain since earlier today with no known mechanism of injury. She was sent by PCP due to concern of cardiac etiology; EKG in triage was noted to be unremarkable. Pt is able to make a fist and can move and straighten her left arm with active ROM but notes that it is quite uncomfortable to do so.  She appears uncomfortable but not acutely distressed at this time. EDP currently at bedside performing physical exam.

## 2022-10-28 NOTE — Discharge Instructions (Signed)
Try alternating ice and heat to your left upper back and shoulder area.  Avoid excessive use of your left arm for several days.  Please call your cardiologist to arrange a follow-up appointment for next week.  Return to the emergency department for any new or worsening symptoms.

## 2022-10-28 NOTE — ED Provider Notes (Signed)
[facility] EMERGENCY DEPARTMENT Provider Note   CSN: [id number] Arrival date & time: 10/28/22  1337     History  Chief Complaint  Patient presents with   Arm Pain    left    [name] is a 76 y.o. female.   Arm Pain Associated symptoms include chest pain (left upper chest pain). Pertinent negatives include no abdominal pain, no headaches and no shortness of breath.       [name] is a 76 y.o. 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  She presents to the Emergency Department complaining of heaviness of her left arm.  Symptoms began approximately 26 hours prior to arrival.  Symptoms began with pain along the left scapular border. She describes feeling a "heaviness" of the left arm with pain radiating into her left jaw, left upper chest and left shoulder blade area.  She had a phone visit with her PCP this morning and was advised to come to the emergency department for further evaluation of possible heart issue.  She denies known cardiac history but has had multiple stress test in the past as well as an echocardiogram in 2018.  She currently denies any shortness of breath or central chest pain, facial numbness or weakness, weakness of her upper or lower extremities.  She also denies any known injury or recent strenuous activity    Home Medications Prior to Admission medications   Medication Sig Start Date End Date Taking? Authorizing Provider  acetaminophen (TYLENOL) 500 MG tablet Take 500 mg by mouth every 6 (six) hours as needed for moderate pain or headache.    [provider]  amitriptyline (ELAVIL) 10 MG tablet Take 1 tablet (10 mg total) by mouth at bedtime. 10/04/22   Harvel Quale, MD  Cholecalciferol (VITAMIN D) 2000 UNITS CAPS Take 2,000 Units by mouth daily.    [provider]  diazepam (VALIUM) 5 MG tablet Take 5 mg by mouth every  8 (eight) hours as needed for anxiety.    [provider]  dicyclomine (BENTYL) 20 MG tablet Take 20 mg by mouth 4 (four) times daily as needed for spasms.    [provider]  ferrous sulfate 325 (65 FE) MG tablet Take 1 tablet (325 mg total) by mouth daily with breakfast. 10/25/16   Rehman, Mechele Dawley, MD  Flaxseed, Linseed, (FLAX SEED OIL PO) Take by mouth. 1200 mg daily    [provider]  gabapentin (NEURONTIN) 300 MG capsule Take 300 mg by mouth at bedtime.    [provider]  metFORMIN (GLUCOPHAGE) 500 MG tablet Take 500 mg by mouth 2 (two) times daily with a meal.     [provider]  montelukast (SINGULAIR) 10 MG tablet Take 10 mg by mouth every morning.    [provider]  omeprazole (PRILOSEC) 20 MG capsule TAKE 1 CAPSULE(20 MG) BY MOUTH TWICE DAILY BEFORE A MEAL 02/22/22   Rehman, Mechele Dawley, MD  OVER THE COUNTER MEDICATION K Gluconate once per day.    [provider]  POTASSIUM PO Take 1 tablet by mouth daily.    [provider]  rivaroxaban (XARELTO) 20 MG TABS tablet Take 20 mg by mouth daily with supper.    [provider]  TURMERIC PO Take 1 tablet by mouth daily.    [provider]  vitamin B-12 (CYANOCOBALAMIN) 500 MCG tablet Take 500 mcg by mouth  daily.    [provider]      Allergies    Nsaids, Cephalexin, Darvocet [propoxyphene n-acetaminophen], Percocet [oxycodone-acetaminophen], and Penicillins    Review of Systems   Review of Systems  Constitutional:  Negative for chills, diaphoresis and fever.  Eyes:  Negative for visual disturbance.  Respiratory:  Negative for chest tightness and shortness of breath.   Cardiovascular:  Positive for chest pain (left upper chest pain).  Gastrointestinal:  Negative for abdominal pain, nausea and vomiting.  Genitourinary:  Negative for difficulty urinating and dysuria.  Musculoskeletal:  Negative for arthralgias, back pain, neck pain and  neck stiffness.  Skin:  Negative for rash.  Neurological:  Negative for dizziness, syncope, facial asymmetry, speech difficulty, weakness, light-headedness, numbness and headaches.  Psychiatric/Behavioral:  Negative for confusion.     Physical Exam Updated Vital Signs BP (!) 168/99 (BP Location: Right Arm)   Pulse 80   Temp 97.9 F (36.6 C) (Oral)   Resp 18   Ht '5\' 3"'$  (1.6 m)   Wt 86.2 kg   SpO2 96%   BMI 33.66 kg/m  Physical Exam Vitals and nursing note reviewed.  Constitutional:      General: She is not in acute distress.    Appearance: Normal appearance. She is not ill-appearing or toxic-appearing.  HENT:     Head: Atraumatic.     Mouth/Throat:     Mouth: Mucous membranes are moist.  Eyes:     Extraocular Movements: Extraocular movements intact.     Conjunctiva/sclera: Conjunctivae normal.     Pupils: Pupils are equal, round, and reactive to light.  Cardiovascular:     Rate and Rhythm: Normal rate and regular rhythm.     Pulses: Normal pulses.  Pulmonary:     Effort: Pulmonary effort is normal.     Breath sounds: Normal breath sounds.  Chest:     Chest wall: No tenderness.  Abdominal:     Palpations: Abdomen is soft.     Tenderness: There is no abdominal tenderness.  Musculoskeletal:        General: Tenderness present.     Cervical back: Normal range of motion.       Back:     Right lower leg: No edema.     Left lower leg: No edema.     Comments: Ttp along the left scapular border.  No midline tenderness.   Skin:    General: Skin is warm.     Capillary Refill: Capillary refill takes less than 2 seconds.     Findings: No rash.  Neurological:     General: No focal deficit present.     Mental Status: She is alert.     GCS: GCS eye subscore is 4. GCS verbal subscore is 5. GCS motor subscore is 6.     Sensory: Sensation is intact. No sensory deficit.     Motor: Motor function is intact. No weakness.     Coordination: Coordination is intact.     Comments: CN  II through XII intact.  Speech clear.  No pronator drift.  No facial droop.     ED Results / Procedures / Treatments   Labs (all labs ordered are listed, but only abnormal results are displayed) Labs Reviewed  BASIC METABOLIC PANEL - Abnormal; Notable for the following components:      Result Value   GFR, Estimated 59 (*)    All other components within normal limits  CBC WITH DIFFERENTIAL/PLATELET  TROPONIN I (HIGH SENSITIVITY)  TROPONIN I (HIGH SENSITIVITY)    EKG EKG Interpretation  Date/Time:  Friday October 28 2022 13:57:25 EDT Ventricular Rate:  83 PR Interval:  204 QRS Duration: 84 QT Interval:  370 QTC Calculation: 434 R Axis:   109 Text Interpretation: Normal sinus rhythm Rightward axis Confirmed by Lajean Saver [phone]) on 10/28/2022 2:01:05 PM  Radiology DG Chest 1 View  Result Date: 10/28/2022 CLINICAL DATA:  Chest pain EXAM: CHEST  1 VIEW COMPARISON:  10/27/2019, CT 07/18/2013 FINDINGS: Hardware in the cervical spine. Large hiatal hernia. Probable atelectasis at the left base. Mild scarring or atelectasis at right base. Stable cardiomediastinal silhouette with aortic atherosclerosis. IMPRESSION: No active disease. Large hiatal hernia. Probable subsegmental atelectasis at the bases Electronically Signed   By: Donavan Foil M.D.   On: 10/28/2022 15:06    Procedures Procedures    Medications Ordered in ED Medications - No data to display  ED Course/ Medical Decision Making/ A&P                           Medical Decision Making Patient sent here under recommendation of PCP for evaluation of left arm pain and heaviness.  Symptoms began greater than 24 hours prior to arrival.  She describes pain of her left upper chest, left shoulder blade area and down left arm and left neck.  No history of coronary artery disease, but has yearly follow-ups with cardiology.  Denies known injury.  She also denies any other associated symptoms.  On my exam, vital signs are  reassuring.  No tachycardia tachypnea or hypoxia.  She does have focal tenderness of along the left scapular border.  She is anticoagulated currently due to history of PE.  No focal neurodeficits on my exam.  There is no facial droop or dysarthria.  I do not appreciate any unilateral weakness  Source of patient's symptoms unclear at this time, differential would include ACS, CVA, TIA, musculoskeletal symptoms.  CVA, TIA felt to be less likely given patient's anticoagulated state and I do not appreciate any unilateral extremity weakness or facial weakness/numbness  Patient had echocardiogram ordered by Dr. Domenic Polite in 2018 showed Normal LV wall thickness with LVEF 60-65% and grade 1 diastolic  dysfunction. Mild mitral regurgitation and tricuspid regurgitation.    Amount and/or Complexity of Data Reviewed Labs: ordered.    Details: Labs interpreted by me, no evidence of leukocytosis, no anemia, chemistries without derangement.  Initial troponin 2 delta troponin without significant change Radiology: ordered.    Details: Chest x-ray shows no active disease ECG/medicine tests: ordered.    Details: EKG shows normal sinus rhythm, read by Dr. Ashok Cordia. Discussion of management or test interpretation with external provider(s): Patient sent here under recommendation of PCP for evaluation of left arm pain and concern for ACS.  I do not appreciate any unilateral extremity weakness, facial weakness or droop.  She does have focal tenderness along the left scapular border, it is unclear if this is the source of her arm pain.  Her work-up this evening is reassuring.  She does have yearly cardiology follow-up appointments.  Heart score of 3  Patient felt to be appropriate for discharge home, she will follow-up closely with her cardiologist and with PCP.  She is agreeable to symptomatic treatment.  Strict return precautions were discussed.            Final Clinical Impression(s) / ED Diagnoses Final  diagnoses:  Left arm pain    Rx /  DC Orders ED Discharge Orders     None         Kem Parkinson, PA-C 10/28/22 1859    Lajean Saver, MD 11/01/22 [phone]

## 2022-10-28 NOTE — ED Triage Notes (Addendum)
Pt to ED c/o left upper arm/shoulder pain x 1 day. Reports no known injury. Reports recurrent problem. No relief with OTC medications.

## 2022-11-10 DIAGNOSIS — Z85828 Personal history of other malignant neoplasm of skin: Secondary | ICD-10-CM | POA: Diagnosis not present

## 2022-11-10 DIAGNOSIS — L570 Actinic keratosis: Secondary | ICD-10-CM | POA: Diagnosis not present

## 2022-11-10 DIAGNOSIS — X32XXXD Exposure to sunlight, subsequent encounter: Secondary | ICD-10-CM | POA: Diagnosis not present

## 2022-11-10 DIAGNOSIS — Z1283 Encounter for screening for malignant neoplasm of skin: Secondary | ICD-10-CM | POA: Diagnosis not present

## 2022-11-10 DIAGNOSIS — D225 Melanocytic nevi of trunk: Secondary | ICD-10-CM | POA: Diagnosis not present

## 2022-11-10 DIAGNOSIS — Z08 Encounter for follow-up examination after completed treatment for malignant neoplasm: Secondary | ICD-10-CM | POA: Diagnosis not present

## 2022-11-10 DIAGNOSIS — C44619 Basal cell carcinoma of skin of left upper limb, including shoulder: Secondary | ICD-10-CM | POA: Diagnosis not present

## 2022-11-14 ENCOUNTER — Encounter (INDEPENDENT_AMBULATORY_CARE_PROVIDER_SITE_OTHER): Payer: Self-pay | Admitting: Gastroenterology

## 2022-11-22 ENCOUNTER — Other Ambulatory Visit: Payer: Self-pay | Admitting: Family Medicine

## 2022-11-22 ENCOUNTER — Ambulatory Visit (HOSPITAL_COMMUNITY)
Admission: RE | Admit: 2022-11-22 | Discharge: 2022-11-22 | Disposition: A | Discharge status: Home / Self Care | Payer: Medicare Other | Source: Ambulatory Visit | Attending: Family Medicine | Admitting: Family Medicine

## 2022-11-22 ENCOUNTER — Other Ambulatory Visit (HOSPITAL_COMMUNITY): Payer: Self-pay | Admitting: Family Medicine

## 2022-11-22 DIAGNOSIS — N202 Calculus of kidney with calculus of ureter: Secondary | ICD-10-CM

## 2022-11-22 DIAGNOSIS — E6609 Other obesity due to excess calories: Secondary | ICD-10-CM | POA: Diagnosis not present

## 2022-11-22 DIAGNOSIS — N200 Calculus of kidney: Secondary | ICD-10-CM | POA: Diagnosis not present

## 2022-11-22 DIAGNOSIS — Z6836 Body mass index (BMI) 36.0-36.9, adult: Secondary | ICD-10-CM | POA: Diagnosis not present

## 2022-11-28 ENCOUNTER — Ambulatory Visit (HOSPITAL_COMMUNITY)
Admission: RE | Admit: 2022-11-28 | Discharge: 2022-11-28 | Disposition: A | Discharge status: Home / Self Care | Payer: Medicare Other | Source: Ambulatory Visit | Attending: Urology | Admitting: Urology

## 2022-11-28 ENCOUNTER — Ambulatory Visit (INDEPENDENT_AMBULATORY_CARE_PROVIDER_SITE_OTHER): Payer: Medicare Other | Admitting: Urology

## 2022-11-28 ENCOUNTER — Encounter: Payer: Self-pay | Admitting: Urology

## 2022-11-28 VITALS — BP 161/82 | HR 93

## 2022-11-28 DIAGNOSIS — N200 Calculus of kidney: Secondary | ICD-10-CM | POA: Insufficient documentation

## 2022-11-28 DIAGNOSIS — Z9049 Acquired absence of other specified parts of digestive tract: Secondary | ICD-10-CM | POA: Diagnosis not present

## 2022-11-28 DIAGNOSIS — I878 Other specified disorders of veins: Secondary | ICD-10-CM | POA: Diagnosis not present

## 2022-11-28 NOTE — Progress Notes (Unsigned)
11/28/2022 2:25 PM   [name] [birth year] [id number]  Referring provider: Sharilyn Sites, MD [address] [place],  [place] [address]  No chief complaint on file.   HPI:  New patient-  1) left renal stone-patient underwent CT scan of the abdomen and pelvis March 2022 which showed a 15 mm left renal pelvic/midpole stone (visible on the scout, SSD 9 cm, HU 1300).  A more recent renal ultrasound November 2023 revealed a stable 14 mm left renal pelvic stone.  She also has a 5.8 cm right upper pole cyst.  Her last urine culture October 2023 was negative.  Her creatinine was 0.99 in November 2023.  2) gross hematuria - she had red urine in Nov 2023. [place] as above benign.   She saw Dr. Jeffie Pollock. She passed two stones yrs ago but never any surgery for stone.   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 Dr. Hilma Favors. She stopped it for c-scope.    She worked for [employer]. She volunteers as Solicitor.   PMH: Past Medical History:  Diagnosis Date   Arthritis    Asthma    Carpal tunnel syndrome    Chronic anxiety    Gastric ulcer    GERD (gastroesophageal reflux disease)    H/O hiatal hernia    History of heart murmur in childhood    History of kidney stones    Iron deficiency anemia    Neuropathy    Following neck surgery.   Pulmonary embolus [facility])    November 2020   Type 2 diabetes mellitus [facility])     Surgical History: Past Surgical History:  Procedure Laterality Date   APPENDECTOMY     BIOPSY  06/01/2022   Procedure: BIOPSY;  Surgeon: Rogene Houston, MD;  [facility];  Service: Endoscopy;;   CHOLECYSTECTOMY     COLONOSCOPY     COLONOSCOPY N/A 09/26/2013   Procedure: COLONOSCOPY;  Surgeon: Rogene Houston, MD;  [facility];  Service: Endoscopy;  Laterality: N/A;  1030-rescheduled to 12:00pm Ann notified pt   COLONOSCOPY N/A 03/27/2015   Procedure: COLONOSCOPY;  Surgeon: Rogene Houston, MD;  [facility];  Service: Endoscopy;  Laterality: N/A;  11:15 - moved to 8:25 - Ann to notify pt   COLONOSCOPY WITH PROPOFOL N/A 06/01/2022   Procedure: COLONOSCOPY WITH PROPOFOL;  Surgeon: Rogene Houston, MD;  [facility];  Service: Endoscopy;  Laterality: N/A;  1005 ASA 2   ESOPHAGOGASTRODUODENOSCOPY  12/08/2011   Procedure: ESOPHAGOGASTRODUODENOSCOPY (EGD);  Surgeon: Rogene Houston, MD;  [facility];  Service: Endoscopy;  Laterality: N/A;  3:00    ESOPHAGOGASTRODUODENOSCOPY N/A 03/27/2015   Procedure: ESOPHAGOGASTRODUODENOSCOPY (EGD);  Surgeon: Rogene Houston, MD;  [facility];  Service: Endoscopy;  Laterality: N/A;   EYE SURGERY Bilateral    cataract surgery   GIVENS CAPSULE STUDY N/A 11/04/2015   Procedure: GIVENS CAPSULE STUDY;  Surgeon: Rogene Houston, MD;  [facility];  Service: Endoscopy;  Laterality: N/A;   Hysterscopy     KNEE ARTHROSCOPY     Left   NECK SURGERY     x 2    TONSILLECTOMY     TOTAL KNEE ARTHROPLASTY  07/11/2012   Procedure: TOTAL KNEE ARTHROPLASTY;  Surgeon: Ninetta Lights, MD;  Location: [place];  Service: Orthopedics;  Laterality: Left;  left total knee arthroplasty   UPPER GASTROINTESTINAL ENDOSCOPY  YAG LASER APPLICATION Right 7/61/9509   Procedure: YAG LASER APPLICATION;  Surgeon: Rutherford Guys, MD;  Location: [facility];  Service: Ophthalmology;  Laterality: Right;    Home Medications:  Allergies as of 11/28/2022       Reactions   Nsaids Other (See Comments)   Caused Ulcers.   Cephalexin    unknown   Darvocet [propoxyphene N-acetaminophen] Nausea Only   Percocet [oxycodone-acetaminophen] Nausea Only   Penicillins Rash, Other (See Comments)   Has patient had a PCN reaction causing immediate rash, facial/tongue/throat swelling, SOB or lightheadedness with hypotension: Yes Has patient had a PCN reaction causing severe rash involving mucus membranes or skin necrosis: No Has patient had a PCN reaction that required  hospitalization No Has patient had a PCN reaction occurring within the last 10 years: Yes If all of the above answers are "NO", then may proceed with Cephalosporin use.        Medication List        Accurate as of November 28, 2022  2:25 PM. If you have any questions, ask your nurse or doctor.          acetaminophen 500 MG tablet Commonly known as: TYLENOL Take 500 mg by mouth every 6 (six) hours as needed for moderate pain or headache.   amitriptyline 10 MG tablet Commonly known as: ELAVIL Take 1 tablet (10 mg total) by mouth at bedtime.   cyanocobalamin 500 MCG tablet Commonly known as: VITAMIN B12 Take 500 mcg by mouth daily.   diazepam 5 MG tablet Commonly known as: VALIUM Take 5 mg by mouth every 8 (eight) hours as needed for anxiety.   dicyclomine 20 MG tablet Commonly known as: BENTYL Take 20 mg by mouth 4 (four) times daily as needed for spasms.   ferrous sulfate 325 (65 FE) MG tablet Take 1 tablet (325 mg total) by mouth daily with breakfast.   FLAX SEED OIL PO Take by mouth. 1200 mg daily   gabapentin 300 MG capsule Commonly known as: NEURONTIN Take 300 mg by mouth at bedtime.   metFORMIN 500 MG tablet Commonly known as: GLUCOPHAGE Take 500 mg by mouth 2 (two) times daily with a meal.   methocarbamol 500 MG tablet Commonly known as: ROBAXIN Take 1 tablet (500 mg total) by mouth 3 (three) times daily. May cause drowsiness   montelukast 10 MG tablet Commonly known as: SINGULAIR Take 10 mg by mouth every morning.   omeprazole 20 MG capsule Commonly known as: PRILOSEC TAKE 1 CAPSULE(20 MG) BY MOUTH TWICE DAILY BEFORE A MEAL   OVER THE COUNTER MEDICATION K Gluconate once per day.   POTASSIUM PO Take 1 tablet by mouth daily.   rivaroxaban 20 MG Tabs tablet Commonly known as: XARELTO Take 20 mg by mouth daily with supper.   TURMERIC PO Take 1 tablet by mouth daily.   Vitamin D 50 MCG (2000 UT) Caps Take 2,000 Units by mouth daily.         Allergies:  Allergies  Allergen Reactions   Nsaids Other (See Comments)    Caused Ulcers.   Cephalexin     unknown   Darvocet [Propoxyphene N-Acetaminophen] Nausea Only   Percocet [Oxycodone-Acetaminophen] Nausea Only   Penicillins Rash and Other (See Comments)    Has patient had a PCN reaction causing immediate rash, facial/tongue/throat swelling, SOB or lightheadedness with hypotension: Yes Has patient had a PCN reaction causing severe rash involving mucus membranes or skin necrosis: No Has patient had a PCN reaction that  required hospitalization No Has patient had a PCN reaction occurring within the last 10 years: Yes If all of the above answers are "NO", then may proceed with Cephalosporin use.     Family History: Family History  Problem Relation Age of Onset   Hypertension Son    Alzheimer's disease Mother    Kidney disease Mother    Kidney disease Father    Cancer Maternal Aunt        leukemia   Liver disease Maternal Uncle    Kidney disease Paternal Uncle        kidney removed   Diabetes Maternal Grandmother    Diabetes Maternal Grandfather    Cancer Paternal Grandmother        breast, liver   Colon cancer Neg Hx     Social History:  reports that she has never smoked. She has never been exposed to tobacco smoke. She has never used smokeless tobacco. She reports that she does not drink alcohol and does not use drugs.   Physical Exam: BP (!) 161/82   Pulse 93   Constitutional:  Alert and oriented, No acute distress. HEENT: [place] AT, moist mucus membranes.  Trachea midline, no masses. Cardiovascular: No clubbing, cyanosis, or edema. Respiratory: Normal respiratory effort, no increased work of breathing. GI: Abdomen is soft, nontender, nondistended, no abdominal masses GU: No CVA tenderness Skin: No rashes, bruises or suspicious lesions. Neurologic: Grossly intact, no focal deficits, moving all 4 extremities. Psychiatric: Normal mood and affect.  Laboratory  Data: Lab Results  Component Value Date   WBC 5.9 10/28/2022   HGB 13.5 10/28/2022   HCT 41.4 10/28/2022   MCV 92.4 10/28/2022   PLT 221 10/28/2022    Lab Results  Component Value Date   CREATININE 0.99 10/28/2022    No results found for: "PSA"  No results found for: "TESTOSTERONE"  Lab Results  Component Value Date   HGBA1C 6.1 (H) 11/02/2017    Urinalysis    Component Value Date/Time   COLORURINE YELLOW 06/24/2018 1000   APPEARANCEUR CLEAR 06/24/2018 1000   LABSPEC 1.014 06/24/2018 1000   PHURINE 5.0 06/24/2018 1000   GLUCOSEU NEGATIVE 06/24/2018 1000   HGBUR NEGATIVE 06/24/2018 1000   BILIRUBINUR negative 10/15/2022 0836   KETONESUR negative 10/15/2022 0836   KETONESUR NEGATIVE 06/24/2018 1000   PROTEINUR =100 (A) 10/15/2022 0836   PROTEINUR NEGATIVE 06/24/2018 1000   UROBILINOGEN 0.2 10/15/2022 0836   UROBILINOGEN 0.2 05/21/2013 2133   NITRITE Negative 10/15/2022 0836   NITRITE NEGATIVE 06/24/2018 1000   LEUKOCYTESUR Trace (A) 10/15/2022 0836    Lab Results  Component Value Date   BACTERIA MANY (A) 05/21/2013    Pertinent Imaging: Reviewed CT scan and ultrasound No results found for this or any previous visit.  Results for orders placed during the hospital encounter of 11/01/17  US Venous Img Lower Bilateral  Narrative CLINICAL DATA:  76 year old female with bilateral lower extremity edema  EXAM: BILATERAL LOWER EXTREMITY VENOUS DOPPLER ULTRASOUND  TECHNIQUE: Gray-scale sonography with graded compression, as well as color Doppler and duplex ultrasound were performed to evaluate the lower extremity deep venous systems from the level of the common femoral vein and including the common femoral, femoral, profunda femoral, popliteal and calf veins including the posterior tibial, peroneal and gastrocnemius veins when visible. The superficial great saphenous vein was also interrogated. Spectral Doppler was utilized to evaluate flow at rest and  with distal augmentation maneuvers in the common femoral, femoral and popliteal veins.  COMPARISON:  None.  FINDINGS: RIGHT LOWER EXTREMITY  Common Femoral Vein: No evidence of thrombus. Normal compressibility, respiratory phasicity and response to augmentation.  Saphenofemoral Junction: No evidence of thrombus. Normal compressibility and flow on color Doppler imaging.  Profunda Femoral Vein: No evidence of thrombus. Normal compressibility and flow on color Doppler imaging.  Femoral Vein: No evidence of thrombus. Normal compressibility, respiratory phasicity and response to augmentation.  Popliteal Vein: No evidence of thrombus. Normal compressibility, respiratory phasicity and response to augmentation.  Calf Veins: No evidence of thrombus. Normal compressibility and flow on color Doppler imaging.  Superficial Great Saphenous Vein: No evidence of thrombus. Normal compressibility.  Venous Reflux:  None.  Other Findings:  None.  LEFT LOWER EXTREMITY  Common Femoral Vein: No evidence of thrombus. Normal compressibility, respiratory phasicity and response to augmentation.  Saphenofemoral Junction: No evidence of thrombus. Normal compressibility and flow on color Doppler imaging.  Profunda Femoral Vein: No evidence of thrombus. Normal compressibility and flow on color Doppler imaging.  Femoral Vein: No evidence of thrombus. Normal compressibility, respiratory phasicity and response to augmentation.  Popliteal Vein: No evidence of thrombus. Normal compressibility, respiratory phasicity and response to augmentation.  Calf Veins: No evidence of thrombus. Normal compressibility and flow on color Doppler imaging.  Superficial Great Saphenous Vein: No evidence of thrombus. Normal compressibility.  Venous Reflux:  None.  Other Findings:  None.  IMPRESSION: No evidence of deep venous thrombosis.   Electronically Signed By: Jacqulynn Cadet M.D. On: 11/02/2017  13:20  No results found for this or any previous visit.  No results found for this or any previous visit.  Results for orders placed during the hospital encounter of 11/22/22  US RENAL  Narrative CLINICAL DATA:  Left renal calculus follow-up.  EXAM: RENAL / URINARY TRACT ULTRASOUND COMPLETE  COMPARISON:  CT abdomen pelvis dated March 05, 2021.  FINDINGS: Right Kidney:  Renal measurements: 11.6 x 4.0 x 5.1 cm = volume: 125 mL. Echogenicity within normal limits. No mass or hydronephrosis visualized. Unchanged extrarenal pelvis. 5.8 cm simple cyst arising from the upper pole, not significantly changed. No follow-up imaging is recommended.  Left Kidney:  Renal measurements: 11.3 x 4.7 x 4.4 cm = volume: 121 mL. Echogenicity within normal limits. Unchanged 1.4 cm calculus in the renal pelvis. No mass or hydronephrosis visualized.  Bladder:  Appears normal for degree of bladder distention.  Other:  None.  IMPRESSION: 1. No acute abnormality. 2. Unchanged left nephrolithiasis.   Electronically Signed By: Titus Dubin M.D. On: 11/22/2022 09:24  No valid procedures specified. No results found for this or any previous visit.  No results found for this or any previous visit.   Assessment & Plan:    1. Kidney stones I drew patient a picture of the anatomy-we discussed that this stone size typically will not pass and the larger gets the more difficult it would be to treat and the treatment is typically recommended.  We went over the nature risk benefits of continued surveillance, shockwave lithotripsy, ureteroscopy and PCNL.  All questions answered.  Her mom had eswl and she is interested in eswl. Check a KUB and she will consider options.   2> gross hematuria - eval bladder with cystoscopy.   - Urinalysis, Routine w reflex microscopic   No follow-ups on file.  Festus Aloe, MD  [facility]  [address] [place],  [place] [address] [phone]

## 2022-11-29 ENCOUNTER — Telehealth: Payer: Self-pay

## 2022-11-29 NOTE — Telephone Encounter (Signed)
Tried calling patient with no answer and unable to leave VM. Will follow up at a later time.

## 2022-11-29 NOTE — Telephone Encounter (Signed)
Patient return call. Made patient aware that she will need an KUB done and depending on what the KUB shows per Dr. Junious Silk there are several options such as getting a CT or having Cysto. Patient voiced she has had her KUB done. Scheduled patient a F/U appt with Dr. Junious Silk. Patient voiced understanding

## 2022-11-30 ENCOUNTER — Telehealth: Payer: Self-pay | Admitting: Urology

## 2022-11-30 ENCOUNTER — Other Ambulatory Visit: Payer: Self-pay | Admitting: Urology

## 2022-11-30 DIAGNOSIS — N200 Calculus of kidney: Secondary | ICD-10-CM

## 2022-11-30 NOTE — Telephone Encounter (Signed)
Please let patient know the stone on the plain xray measured 21 mm which was much bigger than the ultrasound. We need to get a CT scan to better define the stone size and where it is in her kidney to plan treatment. Have her get a CT in about 4 weeks (first part of January) and see me a week or two later to go over the results. Thank you. Order for CT placed.

## 2022-12-01 ENCOUNTER — Telehealth: Payer: Self-pay

## 2022-12-01 DIAGNOSIS — R109 Unspecified abdominal pain: Secondary | ICD-10-CM

## 2022-12-01 DIAGNOSIS — N200 Calculus of kidney: Secondary | ICD-10-CM

## 2022-12-01 NOTE — Telephone Encounter (Signed)
Made patient aware that her KUB showed a 21 mm stone which is bigger than the ultrasound and we will need to get a CT to define the stone size and where it is located to determined a treatment plan. Patient voiced understanding

## 2022-12-09 DIAGNOSIS — Z6834 Body mass index (BMI) 34.0-34.9, adult: Secondary | ICD-10-CM | POA: Diagnosis not present

## 2022-12-09 DIAGNOSIS — D509 Iron deficiency anemia, unspecified: Secondary | ICD-10-CM | POA: Diagnosis not present

## 2022-12-09 DIAGNOSIS — E782 Mixed hyperlipidemia: Secondary | ICD-10-CM | POA: Diagnosis not present

## 2022-12-09 DIAGNOSIS — E1140 Type 2 diabetes mellitus with diabetic neuropathy, unspecified: Secondary | ICD-10-CM | POA: Diagnosis not present

## 2022-12-09 DIAGNOSIS — I10 Essential (primary) hypertension: Secondary | ICD-10-CM | POA: Diagnosis not present

## 2022-12-09 DIAGNOSIS — E119 Type 2 diabetes mellitus without complications: Secondary | ICD-10-CM | POA: Diagnosis not present

## 2022-12-09 DIAGNOSIS — J4530 Mild persistent asthma, uncomplicated: Secondary | ICD-10-CM | POA: Diagnosis not present

## 2022-12-09 DIAGNOSIS — N202 Calculus of kidney with calculus of ureter: Secondary | ICD-10-CM | POA: Diagnosis not present

## 2022-12-09 DIAGNOSIS — K589 Irritable bowel syndrome without diarrhea: Secondary | ICD-10-CM | POA: Diagnosis not present

## 2022-12-09 DIAGNOSIS — E7849 Other hyperlipidemia: Secondary | ICD-10-CM | POA: Diagnosis not present

## 2022-12-09 DIAGNOSIS — E6609 Other obesity due to excess calories: Secondary | ICD-10-CM | POA: Diagnosis not present

## 2022-12-14 ENCOUNTER — Ambulatory Visit (HOSPITAL_COMMUNITY)
Admission: RE | Admit: 2022-12-14 | Discharge: 2022-12-14 | Disposition: A | Discharge status: Home / Self Care | Payer: Medicare Other | Source: Ambulatory Visit | Attending: Urology | Admitting: Urology

## 2022-12-14 DIAGNOSIS — N132 Hydronephrosis with renal and ureteral calculous obstruction: Secondary | ICD-10-CM | POA: Diagnosis not present

## 2022-12-14 DIAGNOSIS — K449 Diaphragmatic hernia without obstruction or gangrene: Secondary | ICD-10-CM | POA: Diagnosis not present

## 2022-12-14 DIAGNOSIS — K760 Fatty (change of) liver, not elsewhere classified: Secondary | ICD-10-CM | POA: Diagnosis not present

## 2022-12-14 DIAGNOSIS — R109 Unspecified abdominal pain: Secondary | ICD-10-CM | POA: Insufficient documentation

## 2022-12-14 DIAGNOSIS — Z9049 Acquired absence of other specified parts of digestive tract: Secondary | ICD-10-CM | POA: Diagnosis not present

## 2022-12-14 DIAGNOSIS — N200 Calculus of kidney: Secondary | ICD-10-CM | POA: Insufficient documentation

## 2022-12-16 ENCOUNTER — Telehealth: Payer: Self-pay

## 2022-12-16 NOTE — Telephone Encounter (Signed)
Patient call in today about CT stone results. Patient voiced that she is in pain and her urine is the color of cranberry juice and will like to know what will be the next steps. Patient voiced that she has a kidney stone. Made patient aware that I will see a message to the MD and once he response someone will reach back out to her.

## 2022-12-20 ENCOUNTER — Other Ambulatory Visit: Payer: Self-pay | Admitting: Urology

## 2022-12-20 NOTE — Progress Notes (Signed)
SURGICAL WAITING ROOM VISITATION  Patients having surgery or a procedure may have no more than 2 support people in the waiting area - these visitors may rotate.    Children under the age of 62 must have an adult with them who is not the patient.  Due to an increase in RSV and influenza rates and associated hospitalizations, children ages 46 and under may not visit patients in [place].  If the patient needs to stay at the hospital during part of their recovery, the visitor guidelines for inpatient rooms apply. Pre-op nurse will coordinate an appropriate time for 1 support person to accompany patient in pre-op.  This support person may not rotate.    Please refer to the [facility] website for the visitor guidelines for Inpatients (after your surgery is over and you are in a regular room).       Your procedure is scheduled on:  12/23/22    Report to [facility] Main Entrance    Report to admitting at   1145AM   Call this number if you have problems the morning of surgery [phone]   Do not eat food or drink liquids  :After Midnight.                           If you have questions, please contact your surgeon's office.    Oral Hygiene is also important to reduce your risk of infection.                                    Remember - BRUSH YOUR TEETH THE MORNING OF SURGERY WITH YOUR REGULAR TOOTHPASTE  DENTURES WILL BE REMOVED PRIOR TO SURGERY PLEASE DO NOT APPLY "Poly grip" OR ADHESIVES!!!   Do NOT smoke after Midnight   Take these medicines the morning of surgery with A SIP OF WATER:  omeprazole   DO NOT TAKE ANY ORAL DIABETIC MEDICATIONS DAY OF YOUR SURGERY  Bring CPAP mask and tubing day of surgery.                              You may not have any metal on your body including hair pins, jewelry, and body piercing             Do not wear make-up, lotions, powders, perfumes/cologne, or deodorant  Do not wear nail polish including gel and S&S,  artificial/acrylic nails, or any other type of covering on natural nails including finger and toenails. If you have artificial nails, gel coating, etc. that needs to be removed by a nail salon please have this removed prior to surgery or surgery may need to be canceled/ delayed if the surgeon/ anesthesia feels like they are unable to be safely monitored.   Do not shave  48 hours prior to surgery.               Men may shave face and neck.   Do not bring valuables to the hospital. [place].   Contacts, glasses, dentures or bridgework may not be worn into surgery.   Bring small overnight bag day of surgery.   DO NOT [place]. PHARMACY WILL DISPENSE MEDICATIONS LISTED ON  YOUR MEDICATION LIST TO YOU DURING YOUR ADMISSION [place]!    Patients discharged on the day of surgery will not be allowed to drive home.  Someone NEEDS to stay with you for the first 24 hours after anesthesia.   Special Instructions: Bring a copy of your healthcare power of attorney and living will documents the day of surgery if you haven't scanned them before.              Please read over the following fact sheets you were given: IF [place] [phone]   If you received a COVID test during your pre-op visit  it is requested that you wear a mask when out in public, stay away from anyone that may not be feeling well and notify your surgeon if you develop symptoms. If you test positive for Covid or have been in contact with anyone that has tested positive in the last 10 days please notify you surgeon.    [place] - Preparing for Surgery Before surgery, you can play an important role.  Because skin is not sterile, your skin needs to be as free of germs as possible.  You can reduce the number of germs on your skin by washing with CHG (chlorahexidine gluconate) soap before  surgery.  CHG is an antiseptic cleaner which kills germs and bonds with the skin to continue killing germs even after washing. Please DO NOT use if you have an allergy to CHG or antibacterial soaps.  If your skin becomes reddened/irritated stop using the CHG and inform your nurse when you arrive at Short Stay. Do not shave (including legs and underarms) for at least 48 hours prior to the first CHG shower.  You may shave your face/neck. Please follow these instructions carefully:  1.  Shower with CHG Soap the night before surgery and the  morning of Surgery.  2.  If you choose to wash your hair, wash your hair first as usual with your  normal  shampoo.  3.  After you shampoo, rinse your hair and body thoroughly to remove the  shampoo.                           4.  Use CHG as you would any other liquid soap.  You can apply chg directly  to the skin and wash                       Gently with a scrungie or clean washcloth.  5.  Apply the CHG Soap to your body ONLY FROM THE NECK DOWN.   Do not use on face/ open                           Wound or open sores. Avoid contact with eyes, ears mouth and genitals (private parts).                       Wash face,  Genitals (private parts) with your normal soap.             6.  Wash thoroughly, paying special attention to the area where your surgery  will be performed.  7.  Thoroughly rinse your body with warm water from the neck down.  8.  DO NOT shower/wash with your normal soap after using and rinsing off  the  CHG Soap.                9.  Pat yourself dry with a clean towel.            10.  Wear clean pajamas.            11.  Place clean sheets on your bed the night of your first shower and do not  sleep with pets. Day of Surgery : Do not apply any lotions/deodorants the morning of surgery.  Please wear clean clothes to the hospital/surgery center.  FAILURE TO FOLLOW THESE INSTRUCTIONS MAY RESULT IN THE CANCELLATION OF YOUR SURGERY PATIENT  SIGNATURE_________________________________  NURSE SIGNATURE__________________________________  ________________________________________________________________________

## 2022-12-21 ENCOUNTER — Encounter (HOSPITAL_COMMUNITY): Payer: Self-pay

## 2022-12-21 ENCOUNTER — Encounter (HOSPITAL_COMMUNITY)
Admission: RE | Admit: 2022-12-21 | Discharge: 2022-12-21 | Disposition: A | Discharge status: Home / Self Care | Payer: Medicare Other | Source: Ambulatory Visit | Attending: Urology | Admitting: Urology

## 2022-12-21 ENCOUNTER — Other Ambulatory Visit: Payer: Self-pay

## 2022-12-21 VITALS — BP 155/90 | HR 89 | Temp 98.6°F | Resp 16 | Ht 63.0 in | Wt 187.0 lb

## 2022-12-21 DIAGNOSIS — E139 Other specified diabetes mellitus without complications: Secondary | ICD-10-CM | POA: Diagnosis not present

## 2022-12-21 DIAGNOSIS — Z01818 Encounter for other preprocedural examination: Secondary | ICD-10-CM

## 2022-12-21 DIAGNOSIS — Z01812 Encounter for preprocedural laboratory examination: Secondary | ICD-10-CM | POA: Insufficient documentation

## 2022-12-21 HISTORY — DX: Other specified postprocedural states: Z98.890

## 2022-12-21 HISTORY — DX: Cardiac murmur, unspecified: R01.1

## 2022-12-21 HISTORY — DX: Nausea with vomiting, unspecified: R11.2

## 2022-12-21 HISTORY — DX: Paralytic syndrome, unspecified: G83.9

## 2022-12-21 LAB — CBC
HCT: 40.4 % (ref 36.0–46.0)
Hemoglobin: 12.8 g/dL (ref 12.0–15.0)
MCH: 29.4 pg (ref 26.0–34.0)
MCHC: 31.7 g/dL (ref 30.0–36.0)
MCV: 92.7 fL (ref 80.0–100.0)
Platelets: 288 10*3/uL (ref 150–400)
RBC: 4.36 MIL/uL (ref 3.87–5.11)
RDW: 13.7 % (ref 11.5–15.5)
WBC: 6.1 10*3/uL (ref 4.0–10.5)
nRBC: 0 % (ref 0.0–0.2)

## 2022-12-21 LAB — BASIC METABOLIC PANEL
Anion gap: 5 (ref 5–15)
BUN: 20 mg/dL (ref 8–23)
CO2: 27 mmol/L (ref 22–32)
Calcium: 10.1 mg/dL (ref 8.9–10.3)
Chloride: 111 mmol/L (ref 98–111)
Creatinine, Ser: 1.05 mg/dL — ABNORMAL HIGH (ref 0.44–1.00)
GFR, Estimated: 55 mL/min — ABNORMAL LOW (ref >60)
Glucose, Bld: 119 mg/dL — ABNORMAL HIGH (ref 70–99)
Potassium: 4.8 mmol/L (ref 3.5–5.1)
Sodium: 143 mmol/L (ref 135–145)

## 2022-12-21 LAB — GLUCOSE, CAPILLARY: Glucose-Capillary: 105 mg/dL — ABNORMAL HIGH (ref 70–99)

## 2022-12-21 NOTE — Progress Notes (Addendum)
Anesthesia Review:  PCP: Sharilyn Sites  Cardiologist : Johnny Bridge  LOV 01/08/20  Chest x-ray :10/28/22- 1 view  EKG : 10/28/22  Echo : 2018  Stress test: 2018  Cardiac Cath :  Activity level: can do a flight of stairs withouiit difficutly  Sleep Study/ CPAP : none  Fasting Blood Sugar :      / Checks Blood Sugar -- times a day:   Blood Thinner/ Instructions /Last Dose: ASA / Instructions/ Last Dose :    DM- type 2 checks glucose once daily in am Hgba1c- 12/09/22- at MD office - have requested from [place] office. 6.2 on 12/09/22 - on chart     Xarelto- Last dose - 12/19/22 per pt    In ED on 10/28/22 for left arm pain   Requested preop orders on 12/21/22.  Spoke with Darrel Reach.

## 2022-12-22 NOTE — H&P (Signed)
Festus Aloe, MD Physician Urology   Progress Notes    Signed   Encounter Date: 11/28/2022   Signed     Expand All Collapse All      11/28/2022 2:25 PM    [name] July 18, 1946 [id number]   Referring provider: Sharilyn Sites, [address] [place],  [place] [address]   No chief complaint on file.     HPI:   New patient-   1) left renal stone-patient underwent CT scan of the abdomen and pelvis March 2022 which showed a 15 mm left renal pelvic/midpole stone (visible on the scout, SSD 9 cm, HU 1300).  A more recent renal ultrasound November 2023 revealed a stable 14 mm left renal pelvic stone.  She also has a 5.8 cm right upper pole cyst.   Her last urine culture October 2023 was negative.  Her creatinine was 0.99 in November 2023.   2) gross hematuria - she had red urine in Nov 2023. [place] as above benign.    She saw Dr. Jeffie Pollock. She passed two stones yrs ago but never any surgery for stone.    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 Dr. Hilma Favors. She stopped it for c-scope.      She worked for [employer] and [employer]. She volunteers as Solicitor.    PMH:     Past Medical History:  Diagnosis Date   Arthritis     Asthma     Carpal tunnel syndrome     Chronic anxiety     Gastric ulcer     GERD (gastroesophageal reflux disease)     H/O hiatal hernia     History of heart murmur in childhood     History of kidney stones     Iron deficiency anemia     Neuropathy      Following neck surgery.   Pulmonary embolus [facility])      November 2020   Type 2 diabetes mellitus [facility])        Surgical History:      Past Surgical History:  Procedure Laterality Date   APPENDECTOMY       BIOPSY   06/01/2022    Procedure: BIOPSY;  Surgeon: Rogene Houston, MD;  [facility];  Service: Endoscopy;;   CHOLECYSTECTOMY       COLONOSCOPY       COLONOSCOPY N/A 09/26/2013    Procedure: COLONOSCOPY;  Surgeon: Rogene Houston, MD;  Location[facility];  Service: Endoscopy;  Laterality: N/A;  1030-rescheduled to 12:00pm Ann notified pt   COLONOSCOPY N/A 03/27/2015    Procedure: COLONOSCOPY;  Surgeon: Rogene Houston, MD;  [facility];  Service: Endoscopy;  Laterality: N/A;  11:15 - moved to 8:25 - Ann to notify pt   COLONOSCOPY WITH PROPOFOL N/A 06/01/2022    Procedure: COLONOSCOPY WITH PROPOFOL;  Surgeon: Rogene Houston, MD;  [facility];  Service: Endoscopy;  Laterality: N/A;  1005 ASA 2   ESOPHAGOGASTRODUODENOSCOPY   12/08/2011    Procedure: ESOPHAGOGASTRODUODENOSCOPY (EGD);  Surgeon: Rogene Houston, MD;  [facility];  Service: Endoscopy;  Laterality: N/A;  3:00    ESOPHAGOGASTRODUODENOSCOPY N/A 03/27/2015    Procedure: ESOPHAGOGASTRODUODENOSCOPY (EGD);  Surgeon: Rogene Houston, MD;  [facility];  Service: Endoscopy;  Laterality: N/A;   EYE SURGERY Bilateral      cataract surgery   GIVENS CAPSULE STUDY N/A 11/04/2015    Procedure:  GIVENS CAPSULE STUDY;  Surgeon: Rogene Houston, MD;  [facility];  Service: Endoscopy;  Laterality: N/A;   Hysterscopy       KNEE ARTHROSCOPY        Left   NECK SURGERY        x 2    TONSILLECTOMY       TOTAL KNEE ARTHROPLASTY   07/11/2012    Procedure: TOTAL KNEE ARTHROPLASTY;  Surgeon: Ninetta Lights, MD;  Location: [place];  Service: Orthopedics;  Laterality: Left;  left total knee arthroplasty   UPPER GASTROINTESTINAL ENDOSCOPY       YAG LASER APPLICATION Right 04/03/8118    Procedure: YAG LASER APPLICATION;  Surgeon: Rutherford Guys, MD;  Location: [facility];  Service: Ophthalmology;  Laterality: Right;      Home Medications:  Allergies as of 11/28/2022         Reactions    Nsaids Other (See Comments)    Caused Ulcers.    Cephalexin      unknown    Darvocet [propoxyphene N-acetaminophen] Nausea Only    Percocet [oxycodone-acetaminophen] Nausea Only    Penicillins Rash, Other (See Comments)    Has patient had a PCN reaction causing  immediate rash, facial/tongue/throat swelling, SOB or lightheadedness with hypotension: Yes Has patient had a PCN reaction causing severe rash involving mucus membranes or skin necrosis: No Has patient had a PCN reaction that required hospitalization No Has patient had a PCN reaction occurring within the last 10 years: Yes If all of the above answers are "NO", then may proceed with Cephalosporin use.            Medication List           Accurate as of November 28, 2022  2:25 PM. If you have any questions, ask your nurse or doctor.              acetaminophen 500 MG tablet Commonly known as: TYLENOL Take 500 mg by mouth every 6 (six) hours as needed for moderate pain or headache.    amitriptyline 10 MG tablet Commonly known as: ELAVIL Take 1 tablet (10 mg total) by mouth at bedtime.    cyanocobalamin 500 MCG tablet Commonly known as: VITAMIN B12 Take 500 mcg by mouth daily.    diazepam 5 MG tablet Commonly known as: VALIUM Take 5 mg by mouth every 8 (eight) hours as needed for anxiety.    dicyclomine 20 MG tablet Commonly known as: BENTYL Take 20 mg by mouth 4 (four) times daily as needed for spasms.    ferrous sulfate 325 (65 FE) MG tablet Take 1 tablet (325 mg total) by mouth daily with breakfast.    FLAX SEED OIL PO Take by mouth. 1200 mg daily    gabapentin 300 MG capsule Commonly known as: NEURONTIN Take 300 mg by mouth at bedtime.    metFORMIN 500 MG tablet Commonly known as: GLUCOPHAGE Take 500 mg by mouth 2 (two) times daily with a meal.    methocarbamol 500 MG tablet Commonly known as: ROBAXIN Take 1 tablet (500 mg total) by mouth 3 (three) times daily. May cause drowsiness    montelukast 10 MG tablet Commonly known as: SINGULAIR Take 10 mg by mouth every morning.    omeprazole 20 MG capsule Commonly known as: PRILOSEC TAKE 1 CAPSULE(20 MG) BY MOUTH TWICE DAILY BEFORE A MEAL    OVER THE COUNTER MEDICATION K Gluconate once per day.    POTASSIUM  PO Take  1 tablet by mouth daily.    rivaroxaban 20 MG Tabs tablet Commonly known as: XARELTO Take 20 mg by mouth daily with supper.    TURMERIC PO Take 1 tablet by mouth daily.    Vitamin D 50 MCG (2000 UT) Caps Take 2,000 Units by mouth daily.             Allergies:       Allergies  Allergen Reactions   Nsaids Other (See Comments)      Caused Ulcers.   Cephalexin        unknown   Darvocet [Propoxyphene N-Acetaminophen] Nausea Only   Percocet [Oxycodone-Acetaminophen] Nausea Only   Penicillins Rash and Other (See Comments)      Has patient had a PCN reaction causing immediate rash, facial/tongue/throat swelling, SOB or lightheadedness with hypotension: Yes Has patient had a PCN reaction causing severe rash involving mucus membranes or skin necrosis: No Has patient had a PCN reaction that required hospitalization No Has patient had a PCN reaction occurring within the last 10 years: Yes If all of the above answers are "NO", then may proceed with Cephalosporin use.        Family History:      Family History  Problem Relation Age of Onset   Hypertension Son     Alzheimer's disease Mother     Kidney disease Mother     Kidney disease Father     Cancer Maternal Aunt          leukemia   Liver disease Maternal Uncle     Kidney disease Paternal Uncle          kidney removed   Diabetes Maternal Grandmother     Diabetes Maternal Grandfather     Cancer Paternal Grandmother          breast, liver   Colon cancer Neg Hx        Social History:  reports that she has never smoked. She has never been exposed to tobacco smoke. She has never used smokeless tobacco. She reports that she does not drink alcohol and does not use drugs.     Physical Exam: BP (!) 161/82   Pulse 93   Constitutional:  Alert and oriented, No acute distress. HEENT: [name] AT, moist mucus membranes.  Trachea midline, no masses. Cardiovascular: No clubbing, cyanosis, or edema. Respiratory: Normal  respiratory effort, no increased work of breathing. GI: Abdomen is soft, nontender, nondistended, no abdominal masses GU: No CVA tenderness Skin: No rashes, bruises or suspicious lesions. Neurologic: Grossly intact, no focal deficits, moving all 4 extremities. Psychiatric: Normal mood and affect.   Laboratory Data: Recent Labs       Lab Results  Component Value Date    WBC 5.9 10/28/2022    HGB 13.5 10/28/2022    HCT 41.4 10/28/2022    MCV 92.4 10/28/2022    PLT 221 10/28/2022        Recent Labs       Lab Results  Component Value Date    CREATININE 0.99 10/28/2022        Recent Labs  No results found for: "PSA"     Recent Labs  No results found for: "TESTOSTERONE"     Recent Labs       Lab Results  Component Value Date    HGBA1C 6.1 (H) 11/02/2017        Urinalysis Labs (Brief)          Component Value Date/Time  COLORURINE YELLOW 06/24/2018 1000    APPEARANCEUR CLEAR 06/24/2018 1000    LABSPEC 1.014 06/24/2018 1000    PHURINE 5.0 06/24/2018 1000    GLUCOSEU NEGATIVE 06/24/2018 1000    HGBUR NEGATIVE 06/24/2018 1000    BILIRUBINUR negative 10/15/2022 0836    KETONESUR negative 10/15/2022 0836    KETONESUR NEGATIVE 06/24/2018 1000    PROTEINUR =100 (A) 10/15/2022 0836    PROTEINUR NEGATIVE 06/24/2018 1000    UROBILINOGEN 0.2 10/15/2022 0836    UROBILINOGEN 0.2 05/21/2013 2133    NITRITE Negative 10/15/2022 0836    NITRITE NEGATIVE 06/24/2018 1000    LEUKOCYTESUR Trace (A) 10/15/2022 0836        Recent Labs       Lab Results  Component Value Date    BACTERIA MANY (A) 05/21/2013        Pertinent Imaging: Reviewed CT scan and ultrasound No results found for this or any previous visit.   Results for orders placed during the hospital encounter of 11/01/17   US Venous Img Lower Bilateral   Narrative CLINICAL DATA:  76 year old female with bilateral lower extremity edema   EXAM: BILATERAL LOWER EXTREMITY VENOUS DOPPLER ULTRASOUND    TECHNIQUE: Gray-scale sonography with graded compression, as well as color Doppler and duplex ultrasound were performed to evaluate the lower extremity deep venous systems from the level of the common femoral vein and including the common femoral, femoral, profunda femoral, popliteal and calf veins including the posterior tibial, peroneal and gastrocnemius veins when visible. The superficial great saphenous vein was also interrogated. Spectral Doppler was utilized to evaluate flow at rest and with distal augmentation maneuvers in the common femoral, femoral and popliteal veins.   COMPARISON:  None.   FINDINGS: RIGHT LOWER EXTREMITY   Common Femoral Vein: No evidence of thrombus. Normal compressibility, respiratory phasicity and response to augmentation.   Saphenofemoral Junction: No evidence of thrombus. Normal compressibility and flow on color Doppler imaging.   Profunda Femoral Vein: No evidence of thrombus. Normal compressibility and flow on color Doppler imaging.   Femoral Vein: No evidence of thrombus. Normal compressibility, respiratory phasicity and response to augmentation.   Popliteal Vein: No evidence of thrombus. Normal compressibility, respiratory phasicity and response to augmentation.   Calf Veins: No evidence of thrombus. Normal compressibility and flow on color Doppler imaging.   Superficial Great Saphenous Vein: No evidence of thrombus. Normal compressibility.   Venous Reflux:  None.   Other Findings:  None.   LEFT LOWER EXTREMITY   Common Femoral Vein: No evidence of thrombus. Normal compressibility, respiratory phasicity and response to augmentation.   Saphenofemoral Junction: No evidence of thrombus. Normal compressibility and flow on color Doppler imaging.   Profunda Femoral Vein: No evidence of thrombus. Normal compressibility and flow on color Doppler imaging.   Femoral Vein: No evidence of thrombus. Normal compressibility, respiratory  phasicity and response to augmentation.   Popliteal Vein: No evidence of thrombus. Normal compressibility, respiratory phasicity and response to augmentation.   Calf Veins: No evidence of thrombus. Normal compressibility and flow on color Doppler imaging.   Superficial Great Saphenous Vein: No evidence of thrombus. Normal compressibility.   Venous Reflux:  None.   Other Findings:  None.   IMPRESSION: No evidence of deep venous thrombosis.     Electronically Signed By: Jacqulynn Cadet M.D. On: 11/02/2017 13:20   No results found for this or any previous visit.   No results found for this or any previous visit.   Results for  orders placed during the hospital encounter of 11/22/22   US RENAL   Narrative CLINICAL DATA:  Left renal calculus follow-up.   EXAM: RENAL / URINARY TRACT ULTRASOUND COMPLETE   COMPARISON:  CT abdomen pelvis dated March 05, 2021.   FINDINGS: Right Kidney:   Renal measurements: 11.6 x 4.0 x 5.1 cm = volume: 125 mL. Echogenicity within normal limits. No mass or hydronephrosis visualized. Unchanged extrarenal pelvis. 5.8 cm simple cyst arising from the upper pole, not significantly changed. No follow-up imaging is recommended.   Left Kidney:   Renal measurements: 11.3 x 4.7 x 4.4 cm = volume: 121 mL. Echogenicity within normal limits. Unchanged 1.4 cm calculus in the renal pelvis. No mass or hydronephrosis visualized.   Bladder:   Appears normal for degree of bladder distention.   Other:   None.   IMPRESSION: 1. No acute abnormality. 2. Unchanged left nephrolithiasis.     Electronically Signed By: Titus Dubin M.D. On: 11/22/2022 09:24   No valid procedures specified. No results found for this or any previous visit.   No results found for this or any previous visit.     Assessment & Plan:     1. Kidney stones I drew patient a picture of the anatomy-we discussed that this stone size typically will not pass and the  larger gets the more difficult it would be to treat and the treatment is typically recommended.  We went over the nature risk benefits of continued surveillance, shockwave lithotripsy, ureteroscopy and PCNL.  All questions answered.   Her mom had eswl and she is interested in eswl. Check a KUB and she will consider options.    2> gross hematuria - eval bladder with cystoscopy.    - Urinalysis, Routine w reflex microscopic   Addendum: KUB with stone 2.2 cm and CT with stone at 17 mm. In light of stone growth I called her and we discussed nature r/b of cont surv., ESWL, URS and PCNL. All questions answered. She elects to proceed with URS.    No follow-ups on file.   Festus Aloe, MD   [facility]  [address] [place], [place] [address] [phone]            Electronically signed by Festus Aloe, MD at 11/29/2022  9:19 AM

## 2022-12-23 ENCOUNTER — Ambulatory Visit (HOSPITAL_COMMUNITY): Payer: Medicare Other | Admitting: Physician Assistant

## 2022-12-23 ENCOUNTER — Ambulatory Visit (HOSPITAL_COMMUNITY)
Admission: RE | Admit: 2022-12-23 | Discharge: 2022-12-23 | Disposition: A | Discharge status: Home / Self Care | Payer: Medicare Other | Source: Ambulatory Visit | Attending: Urology | Admitting: Urology

## 2022-12-23 ENCOUNTER — Encounter (HOSPITAL_COMMUNITY): Payer: Self-pay | Admitting: Urology

## 2022-12-23 ENCOUNTER — Encounter (HOSPITAL_COMMUNITY)
Admission: RE | Disposition: A | Discharge status: Home / Self Care | Payer: Self-pay | Source: Ambulatory Visit | Attending: Urology

## 2022-12-23 ENCOUNTER — Ambulatory Visit (HOSPITAL_COMMUNITY): Payer: Medicare Other

## 2022-12-23 ENCOUNTER — Ambulatory Visit (HOSPITAL_BASED_OUTPATIENT_CLINIC_OR_DEPARTMENT_OTHER): Payer: Medicare Other | Admitting: Anesthesiology

## 2022-12-23 DIAGNOSIS — Z7984 Long term (current) use of oral hypoglycemic drugs: Secondary | ICD-10-CM | POA: Diagnosis not present

## 2022-12-23 DIAGNOSIS — Z01818 Encounter for other preprocedural examination: Secondary | ICD-10-CM

## 2022-12-23 DIAGNOSIS — N132 Hydronephrosis with renal and ureteral calculous obstruction: Secondary | ICD-10-CM | POA: Insufficient documentation

## 2022-12-23 DIAGNOSIS — J45909 Unspecified asthma, uncomplicated: Secondary | ICD-10-CM | POA: Insufficient documentation

## 2022-12-23 DIAGNOSIS — N200 Calculus of kidney: Secondary | ICD-10-CM

## 2022-12-23 DIAGNOSIS — Z86718 Personal history of other venous thrombosis and embolism: Secondary | ICD-10-CM | POA: Diagnosis not present

## 2022-12-23 DIAGNOSIS — E119 Type 2 diabetes mellitus without complications: Secondary | ICD-10-CM | POA: Insufficient documentation

## 2022-12-23 DIAGNOSIS — Z86711 Personal history of pulmonary embolism: Secondary | ICD-10-CM | POA: Insufficient documentation

## 2022-12-23 DIAGNOSIS — K219 Gastro-esophageal reflux disease without esophagitis: Secondary | ICD-10-CM | POA: Diagnosis not present

## 2022-12-23 DIAGNOSIS — K449 Diaphragmatic hernia without obstruction or gangrene: Secondary | ICD-10-CM | POA: Diagnosis not present

## 2022-12-23 DIAGNOSIS — I10 Essential (primary) hypertension: Secondary | ICD-10-CM | POA: Insufficient documentation

## 2022-12-23 DIAGNOSIS — Z7901 Long term (current) use of anticoagulants: Secondary | ICD-10-CM | POA: Diagnosis not present

## 2022-12-23 DIAGNOSIS — R310 Gross hematuria: Secondary | ICD-10-CM | POA: Diagnosis not present

## 2022-12-23 DIAGNOSIS — K279 Peptic ulcer, site unspecified, unspecified as acute or chronic, without hemorrhage or perforation: Secondary | ICD-10-CM

## 2022-12-23 DIAGNOSIS — N1330 Unspecified hydronephrosis: Secondary | ICD-10-CM | POA: Diagnosis not present

## 2022-12-23 HISTORY — PX: CYSTOSCOPY/URETEROSCOPY/HOLMIUM LASER/STENT PLACEMENT: SHX6546

## 2022-12-23 LAB — GLUCOSE, CAPILLARY
Glucose-Capillary: 133 mg/dL — ABNORMAL HIGH (ref 70–99)
Glucose-Capillary: 127 mg/dL — ABNORMAL HIGH (ref 70–99)

## 2022-12-23 SURGERY — CYSTOSCOPY/URETEROSCOPY/HOLMIUM LASER/STENT PLACEMENT
Anesthesia: General | Laterality: Left

## 2022-12-23 MED ORDER — ONDANSETRON HCL 4 MG/2ML IJ SOLN
INTRAMUSCULAR | Status: DC | PRN
  Administered 2022-12-23: 4 mg via INTRAVENOUS

## 2022-12-23 MED ORDER — IOHEXOL 300 MG/ML  SOLN
INTRAMUSCULAR | Status: DC | PRN
  Administered 2022-12-23: 10 mL

## 2022-12-23 MED ORDER — KETOROLAC TROMETHAMINE 30 MG/ML IJ SOLN
30.0000 mg | Freq: Once | INTRAMUSCULAR | Status: AC
  Administered 2022-12-23: 30 mg via INTRAVENOUS

## 2022-12-23 MED ORDER — FENTANYL CITRATE PF 50 MCG/ML IJ SOSY
PREFILLED_SYRINGE | INTRAMUSCULAR | Status: AC
  Filled 2022-12-23: qty 3

## 2022-12-23 MED ORDER — PROPOFOL 10 MG/ML IV BOLUS
INTRAVENOUS | Status: AC
  Filled 2022-12-23: qty 20

## 2022-12-23 MED ORDER — FENTANYL CITRATE (PF) 100 MCG/2ML IJ SOLN
INTRAMUSCULAR | Status: AC
  Filled 2022-12-23: qty 2

## 2022-12-23 MED ORDER — ORAL CARE MOUTH RINSE: 15.0000 mL | Freq: Once | OROMUCOSAL | Status: AC

## 2022-12-23 MED ORDER — AMISULPRIDE (ANTIEMETIC) 5 MG/2ML IV SOLN: 10.0000 mg | Freq: Once | INTRAVENOUS | Status: DC | PRN

## 2022-12-23 MED ORDER — OXYCODONE HCL 5 MG PO TABS: 5.0000 mg | ORAL_TABLET | Freq: Once | ORAL | Status: DC | PRN

## 2022-12-23 MED ORDER — LABETALOL HCL 5 MG/ML IV SOLN
10.0000 mg | Freq: Once | INTRAVENOUS | Status: AC
  Administered 2022-12-23: 10 mg via INTRAVENOUS

## 2022-12-23 MED ORDER — LIDOCAINE 2% (20 MG/ML) 5 ML SYRINGE
INTRAMUSCULAR | Status: DC | PRN
  Administered 2022-12-23: 60 mg via INTRAVENOUS

## 2022-12-23 MED ORDER — LABETALOL HCL 5 MG/ML IV SOLN
INTRAVENOUS | Status: AC
  Filled 2022-12-23: qty 4

## 2022-12-23 MED ORDER — LACTATED RINGERS IV SOLN: INTRAVENOUS | Status: DC

## 2022-12-23 MED ORDER — OXYCODONE HCL 5 MG/5ML PO SOLN: 5.0000 mg | Freq: Once | ORAL | Status: DC | PRN

## 2022-12-23 MED ORDER — PROPOFOL 10 MG/ML IV BOLUS
INTRAVENOUS | Status: DC | PRN
  Administered 2022-12-23: 150 mg via INTRAVENOUS
  Administered 2022-12-23: 50 mg via INTRAVENOUS

## 2022-12-23 MED ORDER — ONDANSETRON HCL 4 MG/2ML IJ SOLN
INTRAMUSCULAR | Status: AC
  Filled 2022-12-23: qty 2

## 2022-12-23 MED ORDER — FENTANYL CITRATE (PF) 100 MCG/2ML IJ SOLN
INTRAMUSCULAR | Status: DC | PRN
  Administered 2022-12-23 (×4): 50 ug via INTRAVENOUS

## 2022-12-23 MED ORDER — CIPROFLOXACIN IN D5W 400 MG/200ML IV SOLN
400.0000 mg | INTRAVENOUS | Status: AC
  Administered 2022-12-23: 400 mg via INTRAVENOUS
  Filled 2022-12-23: qty 200

## 2022-12-23 MED ORDER — DEXAMETHASONE SODIUM PHOSPHATE 10 MG/ML IJ SOLN
INTRAMUSCULAR | Status: AC
  Filled 2022-12-23: qty 1

## 2022-12-23 MED ORDER — KETOROLAC TROMETHAMINE 30 MG/ML IJ SOLN
INTRAMUSCULAR | Status: AC
  Filled 2022-12-23: qty 1

## 2022-12-23 MED ORDER — FENTANYL CITRATE PF 50 MCG/ML IJ SOSY
25.0000 ug | PREFILLED_SYRINGE | INTRAMUSCULAR | Status: DC | PRN
  Administered 2022-12-23 (×3): 50 ug via INTRAVENOUS

## 2022-12-23 MED ORDER — SODIUM CHLORIDE 0.9 % IR SOLN
Status: DC | PRN
  Administered 2022-12-23: 6000 mL via INTRAVESICAL

## 2022-12-23 MED ORDER — 0.9 % SODIUM CHLORIDE (POUR BTL) OPTIME
TOPICAL | Status: DC | PRN
  Administered 2022-12-23: 1000 mL

## 2022-12-23 MED ORDER — CHLORHEXIDINE GLUCONATE 0.12 % MT SOLN
15.0000 mL | Freq: Once | OROMUCOSAL | Status: AC
  Administered 2022-12-23: 15 mL via OROMUCOSAL

## 2022-12-23 MED ORDER — DEXAMETHASONE SODIUM PHOSPHATE 4 MG/ML IJ SOLN
INTRAMUSCULAR | Status: DC | PRN
  Administered 2022-12-23: 5 mg via INTRAVENOUS

## 2022-12-23 MED ORDER — ACETAMINOPHEN 500 MG PO TABS
1000.0000 mg | ORAL_TABLET | Freq: Once | ORAL | Status: AC
  Administered 2022-12-23: 1000 mg via ORAL
  Filled 2022-12-23: qty 2

## 2022-12-23 MED ORDER — ONDANSETRON HCL 4 MG/2ML IJ SOLN: 4.0000 mg | Freq: Once | INTRAMUSCULAR | Status: DC | PRN

## 2022-12-23 MED ORDER — LIDOCAINE HCL (PF) 2 % IJ SOLN
INTRAMUSCULAR | Status: AC
  Filled 2022-12-23: qty 5

## 2022-12-23 SURGICAL SUPPLY — 25 items
BAG URO CATCHER STRL LF (MISCELLANEOUS) ×1 IMPLANT
BASKET ZERO TIP NITINOL 2.4FR (BASKET) IMPLANT
BSKT STON RTRVL ZERO TP 2.4FR (BASKET) ×1
CATH URET 5FR 28IN CONE TIP (BALLOONS)
CATH URET 5FR 70CM CONE TIP (BALLOONS) IMPLANT
CATH URETL OPEN END 6FR 70 (CATHETERS) ×1 IMPLANT
CLOTH BEACON ORANGE TIMEOUT ST (SAFETY) ×1 IMPLANT
DRSG TEGADERM 2-3/8X2-3/4 SM (GAUZE/BANDAGES/DRESSINGS) IMPLANT
FIBER LASER MOSES 200 DFL (Laser) IMPLANT
GLOVE BIO SURGEON STRL SZ7.5 (GLOVE) ×1 IMPLANT
GOWN STRL REUS W/ TWL XL LVL3 (GOWN DISPOSABLE) ×1 IMPLANT
GOWN STRL REUS W/TWL XL LVL3 (GOWN DISPOSABLE) ×1
GUIDEWIRE STR DUAL SENSOR (WIRE) ×1 IMPLANT
GUIDEWIRE ZIPWRE .038 STRAIGHT (WIRE) IMPLANT
KIT TURNOVER KIT A (KITS) IMPLANT
LASER FIB FLEXIVA PULSE ID 365 (Laser) IMPLANT
MANIFOLD NEPTUNE II (INSTRUMENTS) ×1 IMPLANT
PACK CYSTO (CUSTOM PROCEDURE TRAY) ×1 IMPLANT
SHEATH NAVIGATOR HD 11/13X28 (SHEATH) IMPLANT
SHEATH NAVIGATOR HD 11/13X36 (SHEATH) IMPLANT
STENT URET 6FRX24 CONTOUR (STENTS) IMPLANT
TRACTIP FLEXIVA PULS ID 200XHI (Laser) IMPLANT
TRACTIP FLEXIVA PULSE ID 200 (Laser)
TUBING CONNECTING 10 (TUBING) ×1 IMPLANT
TUBING UROLOGY SET (TUBING) ×1 IMPLANT

## 2022-12-23 NOTE — Progress Notes (Addendum)
Stent was inadvertently pulled when patient was transferred from OR table to bed. [name] is resting comfortably in PACU. AFVSS.I discussed the stent pull with PACU nurse and husband Jeneen Rinks. Discussed return precautions.   She had some left flank pain. [place] with mild left hydronephrosis and she felt much better after a dose of Toradol. Will continue with plan for discharge without left stent replacement. She is AVSS, alert and oriented in PACU. No complaints or pain. Looks well.

## 2022-12-23 NOTE — Anesthesia Postprocedure Evaluation (Signed)
Anesthesia Post Note  Patient: [name]  Procedure(s) Performed: CYSTOSCOPY/ LEFT RETROGRADE/ URETEROSCOPY/HOLMIUM LASER/STENT PLACEMENT (Left)     Patient location during evaluation: PACU Anesthesia Type: General Level of consciousness: awake and alert Pain management: pain level controlled Vital Signs Assessment: post-procedure vital signs reviewed and stable Respiratory status: spontaneous breathing, nonlabored ventilation and respiratory function stable Cardiovascular status: blood pressure returned to baseline and stable Postop Assessment: no apparent nausea or vomiting Anesthetic complications: no   No notable events documented.  Last Vitals:  Vitals:   12/23/22 1700 12/23/22 1715  BP: (!) 156/80 (!) 143/64  Pulse: 87 85  Resp: 18 17  Temp:    SpO2: 92% 92%    Last Pain:  Vitals:   12/23/22 1700  TempSrc:   PainSc: 0-No pain                 Lidia Collum

## 2022-12-23 NOTE — Discharge Instructions (Addendum)
Ureteroscopy and Laser lithotripsy of Kidney Stone, Care After  The following information offers guidance on how to care for yourself after your procedure. Your health care provider may also give you more specific instructions. If you have problems or questions, contact your health care provider. What can I expect after the procedure? After the procedure, it is common to have: Nausea. Mild pain when you urinate. You may feel this pain in your lower back or lower abdomen. The pain should not be severe.  A small amount of blood in your urine for several days. Follow these instructions at home: Medicines Take over-the-counter and prescription medicines only as told by your health care provider. If you were prescribed antibiotics, take them as told by your health care provider. Do not stop using the antibiotic even if you start to feel better. If you were given a sedative during the procedure, it can affect you for several hours. Do not drive or operate machinery until your health care provider says that it is safe. Ask your health care provider if the medicine prescribed to you: Requires you to avoid driving or using machinery. Can cause constipation. You may need to take these actions to prevent or treat constipation: Take over-the-counter or prescription medicines. Eat foods that are high in fiber, such as beans, whole grains, and fresh fruits and vegetables. Limit foods that are high in fat and processed sugars, such as fried or sweet foods. Activity Rest as told by your health care provider. Do not sit for a long time without moving. Get up to take short walks every 1-2 hours. This will improve blood flow and breathing. Ask for help if you feel weak or unsteady. Return to your normal activities as told by your health care provider. Ask your health care provider what activities are safe for you. General instructions   Drink enough fluid to keep your urine pale yellow. Do not use any  products that contain nicotine or tobacco. These products include cigarettes, chewing tobacco, and vaping devices, such as e-cigarettes. These can delay healing after surgery. If you need help quitting, ask your health care provider. Keep all follow-up visits. Contact a health care provider if: You start passing blood clots, or you have more than a small amount of blood in your urine. You have pain that gets worse or does not get better with medicine You have trouble urinating. You feel nauseous or you vomit again and again during a period of more than 2 days after the procedure. You have a fever. Get help right away if: You are passing blood clots that are 1 inch (2.5 cm) or larger in size. You are leaking urine (have incontinence), or you cannot urinate. You have sudden, sharp, or severe pain in your abdomen or lower back. You have swelling or pain in your legs. You have trouble breathing. These symptoms may be an emergency. Get help right away. Call 911. Do not wait to see if the symptoms will go away. Do not drive yourself to the hospital. Summary After the procedure, it is common to have mild pain when you urinate that goes away within a few minutes after you urinate. This may last for up to 1 week. Take over-the-counter and prescription medicines only as told by your health care provider. Drink enough fluid to keep your urine pale yellow. Call your health care provider if you start passing blood clots, or you have more than a small amount of blood in your urine. This information is  not intended to replace advice given to you by your health care provider. Make sure you discuss any questions you have with your health care provider. Document Revised: 01/17/2022 Document Reviewed: 01/17/2022 Elsevier Patient Education  [place].

## 2022-12-23 NOTE — Transfer of Care (Signed)
Immediate Anesthesia Transfer of Care Note  Patient: [name]  Procedure(s) Performed: Procedure(s) (LRB): CYSTOSCOPY/ LEFT RETROGRADE/ URETEROSCOPY/HOLMIUM LASER/STENT PLACEMENT (Left)  Patient Location: PACU  Anesthesia Type: General  Level of Consciousness: awake, sedated, patient cooperative and responds to stimulation  Airway & Oxygen Therapy: Patient Spontanous Breathing and Patient connected to [place] oxygen  Post-op Assessment: Report given to PACU RN, Post -op Vital signs reviewed and stable and Patient moving all extremities  Post vital signs: Reviewed and stable  Complications: No apparent anesthesia complications

## 2022-12-23 NOTE — Op Note (Signed)
Preoperative diagnosis: Left renal stone Postoperative diagnosis: Same  Procedure: Cystoscopy laser lithotripsy, stone basket extraction, left ureteral stent placement  Surgeon: Junious Silk  Anesthesia: General  Indication for procedure: [name] is a 76 year old female with a history of kidney stone.  She elected to proceed with ureteroscopy.  She had developed left flank pain and hematuria.  She is on Xarelto for DVT history.  Findings: On exam the vulva appeared normal, meatus appeared normal.  On cystoscopy the urethra and the bladder were unremarkable.  The right E flux was clear.  Left E flux was bloody.  Bladder carefully inspected no mucosal lesions noted.  No stone or foreign body in the bladder.  Left retrograde pyelogram-this outlined a single ureter single collecting system unit with a large filling defect in the renal pelvis consistent with the stone.  Left ureteroscopy confirmed a stone in the left renal pelvis no mucosal lesions noted.  Ureter normal on the way out.  Description of procedure: After consent was obtained patient brought to the operating room.  After adequate anesthesia she was placed in lithotomy position and prepped and draped in the usual sterile fashion.  Timeout was performed to confirm the patient and procedure.  Cystoscope was passed per urethra and the left ureteral orifice cannulated with 5 [place] open-ended catheter after I inspected the bladder.  I then passed a sensor wire into the upper calyx and backed out the scope.  I then used an short access sheath to get 2 wires in place and went adjacent to the zip wire as the safety wire.  Dual channel digital scope was advanced into the renal pelvis where the stone was noted.  There was some mild bleeding from the medial renal pelvis from stone irritation.  No mucosal lesions.  A 200 m Moses laser fiber was deployed and the stone was dusted.  Dusting was efficient and thorough.  I did try to grab a few fragments for  analysis but these were very small.  Fortunately as the stone broke the fragments fell into a couple of different posterior midpole calyces.  They were finished off here and dusted.  1 fragment dropped to the lower pole and it was dusted.  Careful inspection of the collecting system noted there to be no other stone fragment.  Even with the dust I could get down the mucosa and did not see any other fragments.  The access sheath was backed down on the ureteroscope and the collecting system renal pelvis and ureter inspected on the way out and noted to be normal without injury.  The wire was backloaded on the cystoscope and a 6 x 24 cm stent advanced.  The wire was removed with a good coil seen in the kidney and a good coil in the bladder.  She was awakened taken recovery room in stable condition.  Complications: None  Blood loss: Minimal  Specimens to office lab: Stone fragments  Drains: 6 x 24 cm left ureteral stent with string  Disposition: Patient stable to PACU

## 2022-12-23 NOTE — Anesthesia Preprocedure Evaluation (Signed)
Anesthesia Evaluation  Patient identified by MRN, date of birth, ID band Patient awake    Reviewed: Allergy & Precautions, NPO status , Patient's Chart, lab work & pertinent test results  History of Anesthesia Complications (+) PONV and history of anesthetic complications  Airway Mallampati: II  TM Distance: >3 FB Neck ROM: Full    Dental  (+) Dental Advisory Given   Pulmonary asthma , PE (2020)   Pulmonary exam normal        Cardiovascular hypertension, Normal cardiovascular exam  Echo 2018: Normal LV wall thickness with LVEF 60-65% and grade 1 diastolic    dysfunction. Mild mitral regurgitation and tricuspid    regurgitation. PASP estimated 28 mmHg.     Neuro/Psych negative neurological ROS     GI/Hepatic Neg liver ROS, hiatal hernia, PUD,GERD  ,,  Endo/Other  diabetes, Type 2, Oral Hypoglycemic Agents    Renal/GU Renal disease (LEFT RENAL STONE)  negative genitourinary   Musculoskeletal negative musculoskeletal ROS (+)    Abdominal   Peds  Hematology negative hematology ROS (+)   Anesthesia Other Findings   Reproductive/Obstetrics                             Anesthesia Physical Anesthesia Plan  ASA: 3  Anesthesia Plan: General   Post-op Pain Management: Tylenol PO (pre-op)* and Toradol IV (intra-op)*   Induction: Intravenous  PONV Risk Score and Plan: 4 or greater and Ondansetron, Dexamethasone, Midazolam and Treatment may vary due to age or medical condition  Airway Management Planned: LMA  Additional Equipment: None  Intra-op Plan:   Post-operative Plan: Extubation in OR  Informed Consent: I have reviewed the patients History and Physical, chart, labs and discussed the procedure including the risks, benefits and alternatives for the proposed anesthesia with the patient or authorized representative who has indicated his/her understanding and acceptance.     Dental  advisory given  Plan Discussed with:   Anesthesia Plan Comments:         Anesthesia Quick Evaluation

## 2022-12-23 NOTE — Anesthesia Procedure Notes (Signed)
Procedure Name: LMA Insertion Date/Time: 12/23/2022 12:52 PM  Performed by: Justice Rocher, CRNAPre-anesthesia Checklist: Patient identified, Emergency Drugs available, Suction available, Patient being monitored and Timeout performed Patient Re-evaluated:Patient Re-evaluated prior to induction Oxygen Delivery Method: Circle system utilized Preoxygenation: Pre-oxygenation with 100% oxygen Induction Type: IV induction Ventilation: Mask ventilation without difficulty LMA: LMA inserted LMA Size: 4.0 Number of attempts: 1 Airway Equipment and Method: Bite block Placement Confirmation: positive ETCO2, breath sounds checked- equal and bilateral and CO2 detector Tube secured with: Tape Dental Injury: Teeth and Oropharynx as per pre-operative assessment

## 2022-12-23 NOTE — Interval H&P Note (Signed)
History and Physical Interval Note:  12/23/2022 12:47 PM  [name]  has presented today for surgery, with the diagnosis of LEFT RENAL STONE.  The various methods of treatment have been discussed with the patient and family. After consideration of risks, benefits and other options for treatment, the patient has consented to  Procedure(s): CYSTOSCOPY/ LEFT URETEROSCOPY/HOLMIUM LASER/STENT PLACEMENT (Left) as a surgical intervention.  The patient's history has been reviewed, patient examined, no change in status, stable for surgery.  I have reviewed the patient's chart and labs.  She is well. Urine like "pink lemonade". No dysuria or fever. Discussed again she may need a staged procedure. I drew her a picture of the anatomy. Questions were answered to the patient's satisfaction.     Festus Aloe

## 2022-12-24 ENCOUNTER — Encounter (HOSPITAL_COMMUNITY): Payer: Self-pay | Admitting: Urology

## 2022-12-28 ENCOUNTER — Inpatient Hospital Stay (HOSPITAL_COMMUNITY)
Admission: EM | Admit: 2022-12-28 | Discharge: 2022-12-31 | DRG: 690 | Disposition: A | Discharge status: Home / Self Care | Payer: PPO | Attending: Family Medicine | Admitting: Family Medicine

## 2022-12-28 ENCOUNTER — Emergency Department (HOSPITAL_COMMUNITY): Payer: PPO

## 2022-12-28 DIAGNOSIS — N136 Pyonephrosis: Principal | ICD-10-CM | POA: Diagnosis present

## 2022-12-28 DIAGNOSIS — Z886 Allergy status to analgesic agent status: Secondary | ICD-10-CM

## 2022-12-28 DIAGNOSIS — Z7984 Long term (current) use of oral hypoglycemic drugs: Secondary | ICD-10-CM | POA: Diagnosis not present

## 2022-12-28 DIAGNOSIS — G629 Polyneuropathy, unspecified: Secondary | ICD-10-CM | POA: Diagnosis not present

## 2022-12-28 DIAGNOSIS — N200 Calculus of kidney: Secondary | ICD-10-CM

## 2022-12-28 DIAGNOSIS — Z7901 Long term (current) use of anticoagulants: Secondary | ICD-10-CM

## 2022-12-28 DIAGNOSIS — Z806 Family history of leukemia: Secondary | ICD-10-CM

## 2022-12-28 DIAGNOSIS — Z880 Allergy status to penicillin: Secondary | ICD-10-CM

## 2022-12-28 DIAGNOSIS — Z79899 Other long term (current) drug therapy: Secondary | ICD-10-CM

## 2022-12-28 DIAGNOSIS — Z28310 Unvaccinated for covid-19: Secondary | ICD-10-CM

## 2022-12-28 DIAGNOSIS — E1142 Type 2 diabetes mellitus with diabetic polyneuropathy: Secondary | ICD-10-CM | POA: Diagnosis not present

## 2022-12-28 DIAGNOSIS — Z820 Family history of epilepsy and other diseases of the nervous system: Secondary | ICD-10-CM | POA: Diagnosis not present

## 2022-12-28 DIAGNOSIS — Z841 Family history of disorders of kidney and ureter: Secondary | ICD-10-CM | POA: Diagnosis not present

## 2022-12-28 DIAGNOSIS — F419 Anxiety disorder, unspecified: Secondary | ICD-10-CM | POA: Insufficient documentation

## 2022-12-28 DIAGNOSIS — E118 Type 2 diabetes mellitus with unspecified complications: Secondary | ICD-10-CM | POA: Diagnosis not present

## 2022-12-28 DIAGNOSIS — J45909 Unspecified asthma, uncomplicated: Secondary | ICD-10-CM | POA: Diagnosis present

## 2022-12-28 DIAGNOSIS — Z888 Allergy status to other drugs, medicaments and biological substances status: Secondary | ICD-10-CM

## 2022-12-28 DIAGNOSIS — Z8711 Personal history of peptic ulcer disease: Secondary | ICD-10-CM | POA: Diagnosis not present

## 2022-12-28 DIAGNOSIS — R112 Nausea with vomiting, unspecified: Secondary | ICD-10-CM | POA: Diagnosis present

## 2022-12-28 DIAGNOSIS — Z833 Family history of diabetes mellitus: Secondary | ICD-10-CM | POA: Diagnosis not present

## 2022-12-28 DIAGNOSIS — N23 Unspecified renal colic: Principal | ICD-10-CM

## 2022-12-28 DIAGNOSIS — N201 Calculus of ureter: Secondary | ICD-10-CM | POA: Diagnosis not present

## 2022-12-28 DIAGNOSIS — Z8249 Family history of ischemic heart disease and other diseases of the circulatory system: Secondary | ICD-10-CM

## 2022-12-28 DIAGNOSIS — R310 Gross hematuria: Secondary | ICD-10-CM | POA: Diagnosis not present

## 2022-12-28 DIAGNOSIS — R109 Unspecified abdominal pain: Secondary | ICD-10-CM | POA: Diagnosis present

## 2022-12-28 DIAGNOSIS — N1330 Unspecified hydronephrosis: Secondary | ICD-10-CM | POA: Diagnosis not present

## 2022-12-28 DIAGNOSIS — Z86711 Personal history of pulmonary embolism: Secondary | ICD-10-CM

## 2022-12-28 DIAGNOSIS — N210 Calculus in bladder: Secondary | ICD-10-CM | POA: Diagnosis not present

## 2022-12-28 DIAGNOSIS — E119 Type 2 diabetes mellitus without complications: Secondary | ICD-10-CM

## 2022-12-28 DIAGNOSIS — N3289 Other specified disorders of bladder: Secondary | ICD-10-CM | POA: Diagnosis present

## 2022-12-28 DIAGNOSIS — K219 Gastro-esophageal reflux disease without esophagitis: Secondary | ICD-10-CM | POA: Diagnosis present

## 2022-12-28 DIAGNOSIS — Z86718 Personal history of other venous thrombosis and embolism: Secondary | ICD-10-CM | POA: Diagnosis not present

## 2022-12-28 DIAGNOSIS — R011 Cardiac murmur, unspecified: Secondary | ICD-10-CM | POA: Diagnosis present

## 2022-12-28 DIAGNOSIS — K760 Fatty (change of) liver, not elsewhere classified: Secondary | ICD-10-CM | POA: Diagnosis not present

## 2022-12-28 LAB — URINALYSIS, ROUTINE W REFLEX MICROSCOPIC
Bilirubin Urine: NEGATIVE
Glucose, UA: NEGATIVE mg/dL
Ketones, ur: 5 mg/dL — AB
Nitrite: NEGATIVE
Protein, ur: 30 mg/dL — AB
RBC / HPF: >50 RBC/hpf — ABNORMAL HIGH (ref 0–5)
Specific Gravity, Urine: 1.018 (ref 1.005–1.030)
pH: 7 (ref 5.0–8.0)

## 2022-12-28 LAB — COMPREHENSIVE METABOLIC PANEL
ALT: 22 U/L (ref 0–44)
AST: 18 U/L (ref 15–41)
Albumin: 3.9 g/dL (ref 3.5–5.0)
Alkaline Phosphatase: 61 U/L (ref 38–126)
Anion gap: 8 (ref 5–15)
BUN: 18 mg/dL (ref 8–23)
CO2: 25 mmol/L (ref 22–32)
Calcium: 9.5 mg/dL (ref 8.9–10.3)
Chloride: 106 mmol/L (ref 98–111)
Creatinine, Ser: 1.13 mg/dL — ABNORMAL HIGH (ref 0.44–1.00)
GFR, Estimated: 50 mL/min — ABNORMAL LOW (ref >60)
Glucose, Bld: 110 mg/dL — ABNORMAL HIGH (ref 70–99)
Potassium: 4.3 mmol/L (ref 3.5–5.1)
Sodium: 139 mmol/L (ref 135–145)
Total Bilirubin: 0.5 mg/dL (ref 0.3–1.2)
Total Protein: 6.9 g/dL (ref 6.5–8.1)

## 2022-12-28 LAB — CBC WITH DIFFERENTIAL/PLATELET
Abs Immature Granulocytes: 0.04 10*3/uL (ref 0.00–0.07)
Basophils Absolute: 0.1 10*3/uL (ref 0.0–0.1)
Basophils Relative: 0 %
Eosinophils Absolute: 0.4 10*3/uL (ref 0.0–0.5)
Eosinophils Relative: 3 %
HCT: 39.7 % (ref 36.0–46.0)
Hemoglobin: 12.7 g/dL (ref 12.0–15.0)
Immature Granulocytes: 0 %
Lymphocytes Relative: 11 %
Lymphs Abs: 1.4 10*3/uL (ref 0.7–4.0)
MCH: 29.7 pg (ref 26.0–34.0)
MCHC: 32 g/dL (ref 30.0–36.0)
MCV: 93 fL (ref 80.0–100.0)
Monocytes Absolute: 0.9 10*3/uL (ref 0.1–1.0)
Monocytes Relative: 7 %
Neutro Abs: 9.7 10*3/uL — ABNORMAL HIGH (ref 1.7–7.7)
Neutrophils Relative %: 79 %
Platelets: 297 10*3/uL (ref 150–400)
RBC: 4.27 MIL/uL (ref 3.87–5.11)
RDW: 13.9 % (ref 11.5–15.5)
WBC: 12.4 10*3/uL — ABNORMAL HIGH (ref 4.0–10.5)
nRBC: 0 % (ref 0.0–0.2)

## 2022-12-28 LAB — PROCALCITONIN: Procalcitonin: <0.1 ng/mL

## 2022-12-28 MED ORDER — IOHEXOL 300 MG/ML  SOLN
100.0000 mL | Freq: Once | INTRAMUSCULAR | Status: AC | PRN
  Administered 2022-12-28: 100 mL via INTRAVENOUS

## 2022-12-28 MED ORDER — HYDROMORPHONE HCL 1 MG/ML IJ SOLN
1.0000 mg | Freq: Once | INTRAMUSCULAR | Status: AC
  Administered 2022-12-28: 1 mg via INTRAVENOUS
  Filled 2022-12-28: qty 1

## 2022-12-28 MED ORDER — KETOROLAC TROMETHAMINE 15 MG/ML IJ SOLN
15.0000 mg | Freq: Once | INTRAMUSCULAR | Status: AC
  Administered 2022-12-28: 15 mg via INTRAVENOUS
  Filled 2022-12-28: qty 1

## 2022-12-28 MED ORDER — TAMSULOSIN HCL 0.4 MG PO CAPS
0.4000 mg | ORAL_CAPSULE | Freq: Every day | ORAL | Status: DC
  Administered 2022-12-28 – 2022-12-30 (×3): 0.4 mg via ORAL
  Filled 2022-12-28 (×3): qty 1

## 2022-12-28 MED ORDER — LACTATED RINGERS IV BOLUS
1000.0000 mL | Freq: Once | INTRAVENOUS | Status: AC
  Administered 2022-12-28: 1000 mL via INTRAVENOUS

## 2022-12-28 MED ORDER — LACTATED RINGERS IV SOLN: INTRAVENOUS | Status: DC

## 2022-12-28 NOTE — ED Provider Notes (Signed)
[facility] EMERGENCY DEPARTMENT Provider Note   CSN: [id number] Arrival date & time: 12/28/22  1424     History {Add pertinent medical, surgical, social history, OB history to HPI:1} Chief Complaint  Patient presents with   Flank Pain    [name] is a 77 y.o. female.  HPI 77 year old female presents with left flank pain.  Started all of a sudden a couple hours ago.  She had a lithotripsy on 12/29 and states that she had some postop discomfort and pain for which she has been taking hydrocodone but this pain was severe and sudden.  No fevers or vomiting but she feels like she is going to pass out.  She has been having a little bit of hematuria though she states that was expected because she is on her Xarelto.  She denies any dysuria or vomiting.  Home Medications Prior to Admission medications   Medication Sig Start Date End Date Taking? Authorizing Provider  acetaminophen (TYLENOL) 500 MG tablet Take 500 mg by mouth every 6 (six) hours as needed for moderate pain or headache.    [provider]  amitriptyline (ELAVIL) 10 MG tablet Take 1 tablet (10 mg total) by mouth at bedtime. Patient not taking: Reported on 11/28/2022 10/04/22   Harvel Quale, MD  Cholecalciferol (VITAMIN D) 2000 UNITS CAPS Take 2,000 Units by mouth daily.    [provider]  cyanocobalamin 1000 MCG tablet Take 1,000 mcg by mouth daily.    [provider]  diazepam (VALIUM) 5 MG tablet Take 5 mg by mouth every 8 (eight) hours as needed for anxiety.    [provider]  dicyclomine (BENTYL) 20 MG tablet Take 20 mg by mouth 4 (four) times daily as needed for spasms.    [provider]  ferrous sulfate 325 (65 FE) MG tablet Take 1 tablet (325 mg total) by mouth daily with breakfast. 10/25/16   Rehman, Mechele Dawley, MD  gabapentin (NEURONTIN) 300 MG capsule Take 300 mg by mouth at bedtime.    [provider]  HYDROcodone-acetaminophen (NORCO/VICODIN)  5-325 MG tablet Take 1 tablet by mouth every 8 (eight) hours as needed for severe pain. 12/12/22   [provider]  metFORMIN (GLUCOPHAGE) 500 MG tablet Take 500 mg by mouth 2 (two) times daily with a meal.     [provider]  montelukast (SINGULAIR) 10 MG tablet Take 10 mg by mouth every morning.    [provider]  omeprazole (PRILOSEC) 20 MG capsule TAKE 1 CAPSULE(20 MG) BY MOUTH TWICE DAILY BEFORE A MEAL 02/22/22   Rehman, Mechele Dawley, MD  PEPPERMINT OIL PO Take 1 capsule by mouth daily. Intestinal Defense With Ginger and Fennel    [provider]  POTASSIUM PO Take 1 tablet by mouth daily.    [provider]  Probiotic Product (PROBIOTIC DAILY PO) Take 1 capsule by mouth daily.    [provider]  rivaroxaban (XARELTO) 20 MG TABS tablet Take 20 mg by mouth daily with supper.    [provider]  Turmeric 450 MG CAPS Take 450 mg by mouth daily.    [provider]      Allergies    Nsaids, Cephalexin, Darvocet [propoxyphene n-acetaminophen], Percocet [oxycodone-acetaminophen], and Penicillins    Review of Systems   Review of Systems  Constitutional:  Negative for fever.  Gastrointestinal:  Negative for abdominal pain.  Genitourinary:  Positive for flank pain and hematuria.  Neurological:  Positive for light-headedness.  Physical Exam Updated Vital Signs BP (!) 161/90 (BP Location: Right Arm)   Pulse (!) 102   Temp 98.1 F (36.7 C) (Oral)   Resp 18   Ht '5\' 3"'$  (1.6 m)   Wt 84.8 kg   SpO2 99%   BMI 33.13 kg/m  Physical Exam Vitals and nursing note reviewed.  Constitutional:      Appearance: She is well-developed.     Comments: Appears in severe pain  HENT:     Head: Normocephalic and atraumatic.  Cardiovascular:     Rate and Rhythm: Normal rate and regular rhythm.     Heart sounds: Normal heart sounds.  Pulmonary:     Effort: Pulmonary effort is normal.     Breath sounds: Normal breath sounds.   Abdominal:     Palpations: Abdomen is soft.     Tenderness: There is abdominal tenderness in the left lower quadrant. There is left CVA tenderness.  Skin:    General: Skin is warm and dry.  Neurological:     Mental Status: She is alert.     ED Results / Procedures / Treatments   Labs (all labs ordered are listed, but only abnormal results are displayed) Labs Reviewed  URINALYSIS, ROUTINE W REFLEX MICROSCOPIC  BASIC METABOLIC PANEL  CBC WITH DIFFERENTIAL/PLATELET    EKG None  Radiology No results found.  Procedures Procedures  {Document cardiac monitor, telemetry assessment procedure when appropriate:1}  Medications Ordered in ED Medications  HYDROmorphone (DILAUDID) injection 1 mg (has no administration in time range)  lactated ringers bolus 1,000 mL (has no administration in time range)    ED Course/ Medical Decision Making/ A&P                           Medical Decision Making Amount and/or Complexity of Data Reviewed Labs: ordered. Radiology: ordered.  Risk Prescription drug management.   ***  {Document critical care time when appropriate:1} {Document review of labs and clinical decision tools ie heart score, Chads2Vasc2 etc:1}  {Document your independent review of radiology images, and any outside records:1} {Document your discussion with family members, caretakers, and with consultants:1} {Document social determinants of health affecting pt's care:1} {Document your decision making why or why not admission, treatments were needed:1} Final Clinical Impression(s) / ED Diagnoses Final diagnoses:  None    Rx / DC Orders ED Discharge Orders     None

## 2022-12-28 NOTE — ED Triage Notes (Signed)
Pt to ED C/O left flank pain that started today, history of kidney stones. Pt had cystoscopy Friday, Reports blood in urine, denies burning with urination.

## 2022-12-28 NOTE — ED Notes (Signed)
Assisted pt to bedside commode at this time . [name]

## 2022-12-29 ENCOUNTER — Encounter (HOSPITAL_COMMUNITY): Payer: Self-pay | Admitting: Family Medicine

## 2022-12-29 DIAGNOSIS — R109 Unspecified abdominal pain: Secondary | ICD-10-CM | POA: Diagnosis not present

## 2022-12-29 DIAGNOSIS — Z86711 Personal history of pulmonary embolism: Secondary | ICD-10-CM | POA: Diagnosis not present

## 2022-12-29 DIAGNOSIS — E118 Type 2 diabetes mellitus with unspecified complications: Secondary | ICD-10-CM | POA: Diagnosis not present

## 2022-12-29 DIAGNOSIS — Z79899 Other long term (current) drug therapy: Secondary | ICD-10-CM | POA: Diagnosis not present

## 2022-12-29 DIAGNOSIS — N201 Calculus of ureter: Secondary | ICD-10-CM | POA: Diagnosis present

## 2022-12-29 DIAGNOSIS — R011 Cardiac murmur, unspecified: Secondary | ICD-10-CM | POA: Diagnosis present

## 2022-12-29 DIAGNOSIS — Z806 Family history of leukemia: Secondary | ICD-10-CM | POA: Diagnosis not present

## 2022-12-29 DIAGNOSIS — N210 Calculus in bladder: Secondary | ICD-10-CM | POA: Diagnosis not present

## 2022-12-29 DIAGNOSIS — N200 Calculus of kidney: Secondary | ICD-10-CM | POA: Diagnosis present

## 2022-12-29 DIAGNOSIS — N3289 Other specified disorders of bladder: Secondary | ICD-10-CM | POA: Diagnosis present

## 2022-12-29 DIAGNOSIS — K219 Gastro-esophageal reflux disease without esophagitis: Secondary | ICD-10-CM | POA: Diagnosis present

## 2022-12-29 DIAGNOSIS — Z8249 Family history of ischemic heart disease and other diseases of the circulatory system: Secondary | ICD-10-CM | POA: Diagnosis not present

## 2022-12-29 DIAGNOSIS — Z7984 Long term (current) use of oral hypoglycemic drugs: Secondary | ICD-10-CM | POA: Diagnosis not present

## 2022-12-29 DIAGNOSIS — Z8711 Personal history of peptic ulcer disease: Secondary | ICD-10-CM | POA: Diagnosis not present

## 2022-12-29 DIAGNOSIS — F419 Anxiety disorder, unspecified: Secondary | ICD-10-CM | POA: Diagnosis present

## 2022-12-29 DIAGNOSIS — Z820 Family history of epilepsy and other diseases of the nervous system: Secondary | ICD-10-CM | POA: Diagnosis not present

## 2022-12-29 DIAGNOSIS — Z880 Allergy status to penicillin: Secondary | ICD-10-CM | POA: Diagnosis not present

## 2022-12-29 DIAGNOSIS — G629 Polyneuropathy, unspecified: Secondary | ICD-10-CM

## 2022-12-29 DIAGNOSIS — E1142 Type 2 diabetes mellitus with diabetic polyneuropathy: Secondary | ICD-10-CM | POA: Diagnosis present

## 2022-12-29 DIAGNOSIS — N23 Unspecified renal colic: Principal | ICD-10-CM

## 2022-12-29 DIAGNOSIS — Z841 Family history of disorders of kidney and ureter: Secondary | ICD-10-CM | POA: Diagnosis not present

## 2022-12-29 DIAGNOSIS — R310 Gross hematuria: Secondary | ICD-10-CM | POA: Diagnosis not present

## 2022-12-29 DIAGNOSIS — Z886 Allergy status to analgesic agent status: Secondary | ICD-10-CM | POA: Diagnosis not present

## 2022-12-29 DIAGNOSIS — Z833 Family history of diabetes mellitus: Secondary | ICD-10-CM | POA: Diagnosis not present

## 2022-12-29 DIAGNOSIS — Z28310 Unvaccinated for covid-19: Secondary | ICD-10-CM | POA: Diagnosis not present

## 2022-12-29 DIAGNOSIS — J45909 Unspecified asthma, uncomplicated: Secondary | ICD-10-CM | POA: Diagnosis present

## 2022-12-29 DIAGNOSIS — Z86718 Personal history of other venous thrombosis and embolism: Secondary | ICD-10-CM | POA: Diagnosis not present

## 2022-12-29 DIAGNOSIS — Z7901 Long term (current) use of anticoagulants: Secondary | ICD-10-CM | POA: Diagnosis not present

## 2022-12-29 DIAGNOSIS — N136 Pyonephrosis: Secondary | ICD-10-CM | POA: Diagnosis present

## 2022-12-29 DIAGNOSIS — Z888 Allergy status to other drugs, medicaments and biological substances status: Secondary | ICD-10-CM | POA: Diagnosis not present

## 2022-12-29 HISTORY — DX: Anxiety disorder, unspecified: F41.9

## 2022-12-29 LAB — CBC WITH DIFFERENTIAL/PLATELET
Abs Immature Granulocytes: 0.03 10*3/uL (ref 0.00–0.07)
Basophils Absolute: 0 10*3/uL (ref 0.0–0.1)
Basophils Relative: 0 %
Eosinophils Absolute: 0.1 10*3/uL (ref 0.0–0.5)
Eosinophils Relative: 1 %
HCT: 32.6 % — ABNORMAL LOW (ref 36.0–46.0)
Hemoglobin: 10.5 g/dL — ABNORMAL LOW (ref 12.0–15.0)
Immature Granulocytes: 0 %
Lymphocytes Relative: 16 %
Lymphs Abs: 1.2 10*3/uL (ref 0.7–4.0)
MCH: 29.4 pg (ref 26.0–34.0)
MCHC: 32.2 g/dL (ref 30.0–36.0)
MCV: 91.3 fL (ref 80.0–100.0)
Monocytes Absolute: 0.7 10*3/uL (ref 0.1–1.0)
Monocytes Relative: 8 %
Neutro Abs: 6 10*3/uL (ref 1.7–7.7)
Neutrophils Relative %: 75 %
Platelets: 242 10*3/uL (ref 150–400)
RBC: 3.57 MIL/uL — ABNORMAL LOW (ref 3.87–5.11)
RDW: 13.8 % (ref 11.5–15.5)
WBC: 8 10*3/uL (ref 4.0–10.5)
nRBC: 0 % (ref 0.0–0.2)

## 2022-12-29 LAB — CBG MONITORING, ED
Glucose-Capillary: 101 mg/dL — ABNORMAL HIGH (ref 70–99)
Glucose-Capillary: 94 mg/dL (ref 70–99)
Glucose-Capillary: 116 mg/dL — ABNORMAL HIGH (ref 70–99)

## 2022-12-29 LAB — COMPREHENSIVE METABOLIC PANEL
ALT: 19 U/L (ref 0–44)
AST: 17 U/L (ref 15–41)
Albumin: 3.1 g/dL — ABNORMAL LOW (ref 3.5–5.0)
Alkaline Phosphatase: 48 U/L (ref 38–126)
Anion gap: 8 (ref 5–15)
BUN: 17 mg/dL (ref 8–23)
CO2: 25 mmol/L (ref 22–32)
Calcium: 8.6 mg/dL — ABNORMAL LOW (ref 8.9–10.3)
Chloride: 102 mmol/L (ref 98–111)
Creatinine, Ser: 1.09 mg/dL — ABNORMAL HIGH (ref 0.44–1.00)
GFR, Estimated: 53 mL/min — ABNORMAL LOW (ref >60)
Glucose, Bld: 129 mg/dL — ABNORMAL HIGH (ref 70–99)
Potassium: 3.9 mmol/L (ref 3.5–5.1)
Sodium: 135 mmol/L (ref 135–145)
Total Bilirubin: 0.6 mg/dL (ref 0.3–1.2)
Total Protein: 5.6 g/dL — ABNORMAL LOW (ref 6.5–8.1)

## 2022-12-29 LAB — GLUCOSE, CAPILLARY: Glucose-Capillary: 101 mg/dL — ABNORMAL HIGH (ref 70–99)

## 2022-12-29 LAB — MAGNESIUM: Magnesium: 1.7 mg/dL (ref 1.7–2.4)

## 2022-12-29 MED ORDER — MONTELUKAST SODIUM 10 MG PO TABS
10.0000 mg | ORAL_TABLET | Freq: Every morning | ORAL | Status: DC
  Administered 2022-12-29 – 2022-12-31 (×3): 10 mg via ORAL
  Filled 2022-12-29 (×3): qty 1

## 2022-12-29 MED ORDER — INSULIN ASPART 100 UNIT/ML IJ SOLN
0.0000 [IU] | Freq: Three times a day (TID) | INTRAMUSCULAR | Status: DC
  Administered 2022-12-30: 3 [IU] via SUBCUTANEOUS
  Administered 2022-12-30: 2 [IU] via SUBCUTANEOUS

## 2022-12-29 MED ORDER — KETOROLAC TROMETHAMINE 30 MG/ML IJ SOLN: 30.0000 mg | Freq: Once | INTRAMUSCULAR | Status: DC

## 2022-12-29 MED ORDER — PANTOPRAZOLE SODIUM 40 MG PO TBEC
40.0000 mg | DELAYED_RELEASE_TABLET | Freq: Every day | ORAL | Status: DC
  Administered 2022-12-29 – 2022-12-31 (×3): 40 mg via ORAL
  Filled 2022-12-29 (×3): qty 1

## 2022-12-29 MED ORDER — ONDANSETRON HCL 4 MG PO TABS: 4.0000 mg | ORAL_TABLET | Freq: Four times a day (QID) | ORAL | Status: DC | PRN

## 2022-12-29 MED ORDER — ONDANSETRON HCL 4 MG/2ML IJ SOLN: 4.0000 mg | Freq: Four times a day (QID) | INTRAMUSCULAR | Status: DC | PRN

## 2022-12-29 MED ORDER — RIVAROXABAN 20 MG PO TABS: 20.0000 mg | ORAL_TABLET | Freq: Every day | ORAL | Status: DC

## 2022-12-29 MED ORDER — GABAPENTIN 300 MG PO CAPS
300.0000 mg | ORAL_CAPSULE | Freq: Every day | ORAL | Status: DC
  Administered 2022-12-29 – 2022-12-30 (×3): 300 mg via ORAL
  Filled 2022-12-29 (×3): qty 1

## 2022-12-29 MED ORDER — HYDROCODONE-ACETAMINOPHEN 5-325 MG PO TABS
1.0000 | ORAL_TABLET | Freq: Three times a day (TID) | ORAL | Status: DC | PRN
  Administered 2022-12-30 – 2022-12-31 (×2): 1 via ORAL
  Filled 2022-12-29 (×2): qty 1

## 2022-12-29 MED ORDER — ACETAMINOPHEN 650 MG RE SUPP: 650.0000 mg | Freq: Four times a day (QID) | RECTAL | Status: DC | PRN

## 2022-12-29 MED ORDER — FUROSEMIDE 10 MG/ML IJ SOLN
40.0000 mg | Freq: Once | INTRAMUSCULAR | Status: AC
  Administered 2022-12-29: 40 mg via INTRAVENOUS
  Filled 2022-12-29: qty 4

## 2022-12-29 MED ORDER — INSULIN ASPART 100 UNIT/ML IJ SOLN: 0.0000 [IU] | Freq: Every day | INTRAMUSCULAR | Status: DC

## 2022-12-29 MED ORDER — MORPHINE SULFATE (PF) 2 MG/ML IV SOLN: 2.0000 mg | INTRAVENOUS | Status: DC | PRN

## 2022-12-29 MED ORDER — DIAZEPAM 5 MG PO TABS
5.0000 mg | ORAL_TABLET | Freq: Three times a day (TID) | ORAL | Status: DC | PRN
  Administered 2022-12-31: 5 mg via ORAL
  Filled 2022-12-29: qty 1

## 2022-12-29 MED ORDER — ACETAMINOPHEN 325 MG PO TABS: 650.0000 mg | ORAL_TABLET | Freq: Four times a day (QID) | ORAL | Status: DC | PRN

## 2022-12-29 NOTE — Assessment & Plan Note (Signed)
-   Continue reduced dose of Valium

## 2022-12-29 NOTE — Assessment & Plan Note (Signed)
-   Continue sliding scale coverage

## 2022-12-29 NOTE — TOC Progression Note (Signed)
  Transition of Care [facility]) Screening Note   Patient Details  Name: [name] Date of Birth: Aug 07, 1946   Transition of Care [facility]) CM/SW Contact:    Shade Flood, LCSW Phone Number: 12/29/2022, 10:57 AM    Transition of Care Department [facility]) has reviewed patient and no TOC needs have been identified at this time. We will continue to monitor patient advancement through interdisciplinary progression rounds. If new patient transition needs arise, please place a TOC consult.

## 2022-12-29 NOTE — Consult Note (Signed)
Urology Consult  Referring physician: Dr. Roger Shelter Reason for referral: left flank pain, nephrolithiasis  Chief Complaint: left flank pain  History of Present Illness: Ms Hem is a 77yo with a history of nephrolithiasis who presented to the ER yesterday with uncontrolled left flank pain and left ureteroscopic stone extraction. She underwent left ureteroscopic stone surgery with Dr. Junious Silk on 12/29 and her tethered stent was removed prior to discharge that day. 2 days ago she developed severe, sharp, constant left flank pain with associated nausea and vomiting. She denies any worsening LUTS. No fevers. Labs normal. CT on admission showed multiple distal left ureteral calculi with mild hydronephrosis. Her pain is currently well controlled and she has not had severe pain since last night. She does not know if she passed any calculi.   Past Medical History:  Diagnosis Date   Anxiety 12/29/2022   Arthritis    Asthma    Carpal tunnel syndrome    Gastric ulcer    GERD (gastroesophageal reflux disease)    H/O hiatal hernia    Heart murmur    since birth   History of heart murmur in childhood    History of kidney stones    Iron deficiency anemia    Neuropathy    Following neck surgery.   Paralysis (HCC)    PONV (postoperative nausea and vomiting)    Pulmonary embolus [facility])    November 2020   Type 2 diabetes mellitus [facility])    Past Surgical History:  Procedure Laterality Date   APPENDECTOMY     BIOPSY  06/01/2022   Procedure: BIOPSY;  Surgeon: Rogene Houston, MD;  [facility];  Service: Endoscopy;;   CHOLECYSTECTOMY     COLONOSCOPY     COLONOSCOPY N/A 09/26/2013   Procedure: COLONOSCOPY;  Surgeon: Rogene Houston, MD;  [facility];  Service: Endoscopy;  Laterality: N/A;  1030-rescheduled to 12:00pm Ann notified pt   COLONOSCOPY N/A 03/27/2015   Procedure: COLONOSCOPY;  Surgeon: Rogene Houston, MD;  [facility];  Service: Endoscopy;  Laterality: N/A;   11:15 - moved to 8:25 - Ann to notify pt   COLONOSCOPY WITH PROPOFOL N/A 06/01/2022   Procedure: COLONOSCOPY WITH PROPOFOL;  Surgeon: Rogene Houston, MD;  [facility];  Service: Endoscopy;  Laterality: N/A;  1005 ASA 2   CYSTOSCOPY/URETEROSCOPY/HOLMIUM LASER/STENT PLACEMENT Left 12/23/2022   Procedure: CYSTOSCOPY/ LEFT RETROGRADE/ URETEROSCOPY/HOLMIUM LASER/STENT PLACEMENT;  Surgeon: Festus Aloe, MD;  Location: [facility];  Service: Urology;  Laterality: Left;   ESOPHAGOGASTRODUODENOSCOPY  12/08/2011   Procedure: ESOPHAGOGASTRODUODENOSCOPY (EGD);  Surgeon: Rogene Houston, MD;  [facility];  Service: Endoscopy;  Laterality: N/A;  3:00    ESOPHAGOGASTRODUODENOSCOPY N/A 03/27/2015   Procedure: ESOPHAGOGASTRODUODENOSCOPY (EGD);  Surgeon: Rogene Houston, MD;  [facility];  Service: Endoscopy;  Laterality: N/A;   EYE SURGERY Bilateral    cataract surgery   GIVENS CAPSULE STUDY N/A 11/04/2015   Procedure: GIVENS CAPSULE STUDY;  Surgeon: Rogene Houston, MD;  [facility];  Service: Endoscopy;  Laterality: N/A;   Hysterscopy     KNEE ARTHROSCOPY     Left   NECK SURGERY     x 2    TONSILLECTOMY     TOTAL KNEE ARTHROPLASTY  07/11/2012   Procedure: TOTAL KNEE ARTHROPLASTY;  Surgeon: Ninetta Lights, MD;  Location: [facility];  Service: Orthopedics;  Laterality: Left;  left total knee arthroplasty   UPPER GASTROINTESTINAL ENDOSCOPY  YAG LASER APPLICATION Right 6/73/4193   Procedure: YAG LASER APPLICATION;  Surgeon: Rutherford Guys, MD;  Location: [facility];  Service: Ophthalmology;  Laterality: Right;    Medications: I have reviewed the patient's current medications. Allergies:  Allergies  Allergen Reactions   Nsaids Other (See Comments)    Caused Ulcers.   Cephalexin     unknown   Darvocet [Propoxyphene N-Acetaminophen] Nausea Only   Percocet [Oxycodone-Acetaminophen] Nausea Only   Penicillins Rash and Other (See Comments)    Has patient had a PCN  reaction causing immediate rash, facial/tongue/throat swelling, SOB or lightheadedness with hypotension: Yes Has patient had a PCN reaction causing severe rash involving mucus membranes or skin necrosis: No Has patient had a PCN reaction that required hospitalization No Has patient had a PCN reaction occurring within the last 10 years: Yes If all of the above answers are "NO", then may proceed with Cephalosporin use.     Family History  Problem Relation Age of Onset   Hypertension Son    Alzheimer's disease Mother    Kidney disease Mother    Kidney disease Father    Cancer Maternal Aunt        leukemia   Liver disease Maternal Uncle    Kidney disease Paternal Uncle        kidney removed   Diabetes Maternal Grandmother    Diabetes Maternal Grandfather    Cancer Paternal Grandmother        breast, liver   Colon cancer Neg Hx    Social History:  reports that she has never smoked. She has never been exposed to tobacco smoke. She has never used smokeless tobacco. She reports that she does not drink alcohol and does not use drugs.  Review of Systems  Genitourinary:  Positive for flank pain.  All other systems reviewed and are negative.   Physical Exam:  Vital signs in last 24 hours: Temp:  [97.7 F (36.5 C)-98.1 F (36.7 C)] 98 F (36.7 C) (01/04 1044) Pulse Rate:  [63-108] 72 (01/04 1300) Resp:  [13-26] 22 (01/04 1300) BP: (130-194)/(60-132) 154/71 (01/04 1300) SpO2:  [91 %-100 %] 96 % (01/04 1300) Weight:  [84.8 kg] 84.8 kg (01/03 1452) Physical Exam Vitals reviewed.  Constitutional:      Appearance: Normal appearance.  HENT:     Head: Normocephalic and atraumatic.     Nose: Nose normal.     Mouth/Throat:     Mouth: Mucous membranes are dry.  Eyes:     Extraocular Movements: Extraocular movements intact.     Pupils: Pupils are equal, round, and reactive to light.  Cardiovascular:     Rate and Rhythm: Normal rate and regular rhythm.  Pulmonary:     Effort:  Pulmonary effort is normal. No respiratory distress.  Abdominal:     General: Abdomen is flat. There is no distension.  Musculoskeletal:        General: No swelling. Normal range of motion.     Cervical back: Normal range of motion and neck supple.  Skin:    General: Skin is warm and dry.  Neurological:     General: No focal deficit present.     Mental Status: She is alert and oriented to person, place, and time.  Psychiatric:        Mood and Affect: Mood normal.        Behavior: Behavior normal.        Thought Content: Thought content normal.  Judgment: Judgment normal.     Laboratory Data:  Results for orders placed or performed during the hospital encounter of 12/28/22 (from the past 72 hour(s))  CBC with Differential     Status: Abnormal   Collection Time: 12/28/22  3:41 PM  Result Value Ref Range   WBC 12.4 (H) 4.0 - 10.5 K/uL   RBC 4.27 3.87 - 5.11 MIL/uL   Hemoglobin 12.7 12.0 - 15.0 g/dL   HCT 39.7 36.0 - 46.0 %   MCV 93.0 80.0 - 100.0 fL   MCH 29.7 26.0 - 34.0 pg   MCHC 32.0 30.0 - 36.0 g/dL   RDW 13.9 11.5 - 15.5 %   Platelets 297 150 - 400 K/uL   nRBC 0.0 0.0 - 0.2 %   Neutrophils Relative % 79 %   Neutro Abs 9.7 (H) 1.7 - 7.7 K/uL   Lymphocytes Relative 11 %   Lymphs Abs 1.4 0.7 - 4.0 K/uL   Monocytes Relative 7 %   Monocytes Absolute 0.9 0.1 - 1.0 K/uL   Eosinophils Relative 3 %   Eosinophils Absolute 0.4 0.0 - 0.5 K/uL   Basophils Relative 0 %   Basophils Absolute 0.1 0.0 - 0.1 K/uL   Immature Granulocytes 0 %   Abs Immature Granulocytes 0.04 0.00 - 0.07 K/uL    Comment: Performed at [facility], [address], [place], [place] [address]  Comprehensive metabolic panel     Status: Abnormal   Collection Time: 12/28/22  3:41 PM  Result Value Ref Range   Sodium 139 135 - 145 mmol/L   Potassium 4.3 3.5 - 5.1 mmol/L   Chloride 106 98 - 111 mmol/L   CO2 25 22 - 32 mmol/L   Glucose, Bld 110 (H) 70 - 99 mg/dL    Comment: Glucose reference range  applies only to samples taken after fasting for at least 8 hours.   BUN 18 8 - 23 mg/dL   Creatinine, Ser 1.13 (H) 0.44 - 1.00 mg/dL   Calcium 9.5 8.9 - 10.3 mg/dL   Total Protein 6.9 6.5 - 8.1 g/dL   Albumin 3.9 3.5 - 5.0 g/dL   AST 18 15 - 41 U/L   ALT 22 0 - 44 U/L   Alkaline Phosphatase 61 38 - 126 U/L   Total Bilirubin 0.5 0.3 - 1.2 mg/dL   GFR, Estimated 50 (L) >60 mL/min    Comment: (NOTE) Calculated using the CKD-EPI Creatinine Equation (2021)    Anion gap 8 5 - 15    Comment: Performed at [facility], [address], [place], [place] [address]  Procalcitonin - Baseline     Status: None   Collection Time: 12/28/22  3:41 PM  Result Value Ref Range   Procalcitonin <0.10 ng/mL    Comment:        Interpretation: PCT (Procalcitonin) <= 0.5 ng/mL: Systemic infection (sepsis) is not likely. Local bacterial infection is possible. (NOTE)       Sepsis PCT Algorithm           Lower Respiratory Tract                                      Infection PCT Algorithm    ----------------------------     ----------------------------         PCT < 0.25 ng/mL  PCT < 0.10 ng/mL          Strongly encourage             Strongly discourage   discontinuation of antibiotics    initiation of antibiotics    ----------------------------     -----------------------------       PCT 0.25 - 0.50 ng/mL            PCT 0.10 - 0.25 ng/mL               OR       >80% decrease in PCT            Discourage initiation of                                            antibiotics      Encourage discontinuation           of antibiotics    ----------------------------     -----------------------------         PCT >= 0.50 ng/mL              PCT 0.26 - 0.50 ng/mL               AND        <80% decrease in PCT             Encourage initiation of                                             antibiotics       Encourage continuation           of antibiotics    ----------------------------      -----------------------------        PCT >= 0.50 ng/mL                  PCT > 0.50 ng/mL               AND         increase in PCT                  Strongly encourage                                      initiation of antibiotics    Strongly encourage escalation           of antibiotics                                     -----------------------------                                           PCT <= 0.25 ng/mL                                                 OR                                        >  80% decrease in PCT                                      Discontinue / Do not initiate                                             antibiotics  Performed at [facility], [address], [place], [place] [address]   Urinalysis, Routine w reflex microscopic     Status: Abnormal   Collection Time: 12/28/22  6:50 PM  Result Value Ref Range   Color, Urine STRAW (A) YELLOW   APPearance CLEAR CLEAR   Specific Gravity, Urine 1.018 1.005 - 1.030   pH 7.0 5.0 - 8.0   Glucose, UA NEGATIVE NEGATIVE mg/dL   Hgb urine dipstick LARGE (A) NEGATIVE   Bilirubin Urine NEGATIVE NEGATIVE   Ketones, ur 5 (A) NEGATIVE mg/dL   Protein, ur 30 (A) NEGATIVE mg/dL   Nitrite NEGATIVE NEGATIVE   Leukocytes,Ua SMALL (A) NEGATIVE   RBC / HPF >50 (H) 0 - 5 RBC/hpf   WBC, UA 21-50 0 - 5 WBC/hpf   Bacteria, UA RARE (A) NONE SEEN   Squamous Epithelial / HPF 0-5 0 - 5 /HPF    Comment: Performed at [facility], [address] [place], [place] [address]  Comprehensive metabolic panel     Status: Abnormal   Collection Time: 12/29/22  4:23 AM  Result Value Ref Range   Sodium 135 135 - 145 mmol/L   Potassium 3.9 3.5 - 5.1 mmol/L   Chloride 102 98 - 111 mmol/L   CO2 25 22 - 32 mmol/L   Glucose, Bld 129 (H) 70 - 99 mg/dL    Comment: Glucose reference range applies only to samples taken after fasting for at least 8 hours.   BUN 17 8 - 23 mg/dL   Creatinine, Ser 1.09 (H) 0.44 - 1.00 mg/dL   Calcium 8.6 (L) 8.9 - 10.3  mg/dL   Total Protein 5.6 (L) 6.5 - 8.1 g/dL   Albumin 3.1 (L) 3.5 - 5.0 g/dL   AST 17 15 - 41 U/L   ALT 19 0 - 44 U/L   Alkaline Phosphatase 48 38 - 126 U/L   Total Bilirubin 0.6 0.3 - 1.2 mg/dL   GFR, Estimated 53 (L) >60 mL/min    Comment: (NOTE) Calculated using the CKD-EPI Creatinine Equation (2021)    Anion gap 8 5 - 15    Comment: Performed at [facility], [address] [place], [place] [address]  Magnesium     Status: None   Collection Time: 12/29/22  4:23 AM  Result Value Ref Range   Magnesium 1.7 1.7 - 2.4 mg/dL    Comment: Performed at [facility], [address], [place], [place] [address]  CBC with Differential/Platelet     Status: Abnormal   Collection Time: 12/29/22  4:23 AM  Result Value Ref Range   WBC 8.0 4.0 - 10.5 K/uL   RBC 3.57 (L) 3.87 - 5.11 MIL/uL   Hemoglobin 10.5 (L) 12.0 - 15.0 g/dL   HCT 32.6 (L) 36.0 - 46.0 %   MCV 91.3 80.0 - 100.0 fL   MCH 29.4 26.0 - 34.0 pg   MCHC 32.2 30.0 - 36.0 g/dL   RDW 13.8 11.5 -  15.5 %   Platelets 242 150 - 400 K/uL   nRBC 0.0 0.0 - 0.2 %   Neutrophils Relative % 75 %   Neutro Abs 6.0 1.7 - 7.7 K/uL   Lymphocytes Relative 16 %   Lymphs Abs 1.2 0.7 - 4.0 K/uL   Monocytes Relative 8 %   Monocytes Absolute 0.7 0.1 - 1.0 K/uL   Eosinophils Relative 1 %   Eosinophils Absolute 0.1 0.0 - 0.5 K/uL   Basophils Relative 0 %   Basophils Absolute 0.0 0.0 - 0.1 K/uL   Immature Granulocytes 0 %   Abs Immature Granulocytes 0.03 0.00 - 0.07 K/uL    Comment: Performed at [facility], [address] [place],  [address]  CBG monitoring, ED     Status: Abnormal   Collection Time: 12/29/22  8:04 AM  Result Value Ref Range   Glucose-Capillary 101 (H) 70 - 99 mg/dL    Comment: Glucose reference range applies only to samples taken after fasting for at least 8 hours.  CBG monitoring, ED     Status: None   Collection Time: 12/29/22 12:00 PM  Result Value Ref Range   Glucose-Capillary 94 70 - 99 mg/dL    Comment: Glucose  reference range applies only to samples taken after fasting for at least 8 hours.   No results found for this or any previous visit (from the past 240 hour(s)). Creatinine: Recent Labs    12/28/22 1541 12/29/22 0423  CREATININE 1.13* 1.09*   Baseline Creatinine: 1  Impression/Assessment:  76yo with left ureteral calculi  Plan:  -We discussed the management of kidney stones. These options include observation, ureteroscopy, shockwave lithotripsy (ESWL) and percutaneous nephrolithotomy (PCNL). We discussed which options are relevant to the patient's stone(s). We discussed the natural history of kidney stones as well as the complications of untreated stones and the impact on quality of life without treatment as well as with each of the above listed treatments. We also discussed the efficacy of each treatment in its ability to clear the stone burden. With any of these management options I discussed the signs and symptoms of infection and the need for emergent treatment should these be experienced. For each option we discussed the ability of each procedure to clear the patient of their stone burden.   For observation I described the risks which include but are not limited to silent renal damage, life-threatening infection, need for emergent surgery, failure to pass stone and pain.   For ureteroscopy I described the risks which include bleeding, infection, damage to contiguous structures, positioning injury, ureteral stricture, ureteral avulsion, ureteral injury, need for prolonged ureteral stent, inability to perform ureteroscopy, need for an interval procedure, inability to clear stone burden, stent discomfort/pain, heart attack, stroke, pulmonary embolus and the inherent risks with general anesthesia.   For shockwave lithotripsy I described the risks which include arrhythmia, kidney contusion, kidney hemorrhage, need for transfusion, pain, inability to adequately break up stone, inability to pass  stone fragments, Steinstrasse, infection associated with obstructing stones, need for alternate surgical procedure, need for repeat shockwave lithotripsy, MI, CVA, PE and the inherent risks with anesthesia/conscious sedation.   For PCNL I described the risks including positioning injury, pneumothorax, hydrothorax, need for chest tube, inability to clear stone burden, renal laceration, arterial venous fistula or malformation, need for embolization of kidney, loss of kidney or renal function, need for repeat procedure, need for prolonged nephrostomy tube, ureteral avulsion, MI, CVA, PE and the inherent risks of general anesthesia.   -  The patient would like to proceed with medical expulsive therapy. Please continue current pain control regiment.  Nicolette Bang 12/29/2022, 2:17 PM

## 2022-12-29 NOTE — H&P (Signed)
History and Physical    Patient: [name] ZOX:[id number] DOB: 07/16/1946 DOA: 12/28/2022 DOS: the patient was seen and examined on 12/29/2022 PCP: Sharilyn Sites, MD  Patient coming from: Home  Chief Complaint:  Chief Complaint  Patient presents with   Flank Pain   HPI: [name] is a 77 y.o. female with medical history significant of anxiety, GERD, heart murmur, neuropathy, type 2 diabetes mellitus, history of DVT/PE on Xarelto, and more presents the ED with a chief complaint of left flank pain.  The pain started at 11 AM on the same day as presentation.  Patient reports associated nausea but no vomiting.  She describes the pain is feeling like there is a heaviness or rocking her side.  She denies any change in hematuria, but reports that she has continued hematuria since the lithotripsy.  Patient denies any fever.  She reports she did not try any pain medication specifically for this before she came into the hospital.  She did take a hydrocodone that morning so that she could "prevent pain."  Patient reports she has been struggling with flank pain since October.  She has been treating for many UTI leading up to this procedure.  Patient reports that she has had 2 loose stools in 1 day.  She has no other complaints.  Patient does not smoke, does not drink, does not use illicit drugs.  She is not vaccinated for COVID.  Patient is full code. Review of Systems: As mentioned in the history of present illness. All other systems reviewed and are negative. Past Medical History:  Diagnosis Date   Anxiety 12/29/2022   Arthritis    Asthma    Carpal tunnel syndrome    Gastric ulcer    GERD (gastroesophageal reflux disease)    H/O hiatal hernia    Heart murmur    since birth   History of heart murmur in childhood    History of kidney stones    Iron deficiency anemia    Neuropathy    Following neck surgery.   Paralysis (HCC)    PONV (postoperative nausea and vomiting)    Pulmonary embolus  [facility])    November 2020   Type 2 diabetes mellitus [facility])    Past Surgical History:  Procedure Laterality Date   APPENDECTOMY     BIOPSY  06/01/2022   Procedure: BIOPSY;  Surgeon: Rogene Houston, MD;  [facility];  Service: Endoscopy;;   CHOLECYSTECTOMY     COLONOSCOPY     COLONOSCOPY N/A 09/26/2013   Procedure: COLONOSCOPY;  Surgeon: Rogene Houston, MD;  [facility];  Service: Endoscopy;  Laterality: N/A;  1030-rescheduled to 12:00pm Ann notified pt   COLONOSCOPY N/A 03/27/2015   Procedure: COLONOSCOPY;  Surgeon: Rogene Houston, MD;  [facility];  Service: Endoscopy;  Laterality: N/A;  11:15 - moved to 8:25 - Ann to notify pt   COLONOSCOPY WITH PROPOFOL N/A 06/01/2022   Procedure: COLONOSCOPY WITH PROPOFOL;  Surgeon: Rogene Houston, MD;  [facility];  Service: Endoscopy;  Laterality: N/A;  1005 ASA 2   CYSTOSCOPY/URETEROSCOPY/HOLMIUM LASER/STENT PLACEMENT Left 12/23/2022   Procedure: CYSTOSCOPY/ LEFT RETROGRADE/ URETEROSCOPY/HOLMIUM LASER/STENT PLACEMENT;  Surgeon: Festus Aloe, MD;  Location: [facility];  Service: Urology;  Laterality: Left;   ESOPHAGOGASTRODUODENOSCOPY  12/08/2011   Procedure: ESOPHAGOGASTRODUODENOSCOPY (EGD);  Surgeon: Rogene Houston, MD;  [facility];  Service: Endoscopy;  Laterality: N/A;  3:00    ESOPHAGOGASTRODUODENOSCOPY N/A 03/27/2015  Procedure: ESOPHAGOGASTRODUODENOSCOPY (EGD);  Surgeon: Rogene Houston, MD;  [facility];  Service: Endoscopy;  Laterality: N/A;   EYE SURGERY Bilateral    cataract surgery   GIVENS CAPSULE STUDY N/A 11/04/2015   Procedure: GIVENS CAPSULE STUDY;  Surgeon: Rogene Houston, MD;  [facility];  Service: Endoscopy;  Laterality: N/A;   Hysterscopy     KNEE ARTHROSCOPY     Left   NECK SURGERY     x 2    TONSILLECTOMY     TOTAL KNEE ARTHROPLASTY  07/11/2012   Procedure: TOTAL KNEE ARTHROPLASTY;  Surgeon: Ninetta Lights, MD;  Location: [place];  Service:  Orthopedics;  Laterality: Left;  left total knee arthroplasty   UPPER GASTROINTESTINAL ENDOSCOPY     YAG LASER APPLICATION Right 5/63/1497   Procedure: YAG LASER APPLICATION;  Surgeon: Rutherford Guys, MD;  Location: [facility];  Service: Ophthalmology;  Laterality: Right;   Social History:  reports that she has never smoked. She has never been exposed to tobacco smoke. She has never used smokeless tobacco. She reports that she does not drink alcohol and does not use drugs.  Allergies  Allergen Reactions   Nsaids Other (See Comments)    Caused Ulcers.   Cephalexin     unknown   Darvocet [Propoxyphene N-Acetaminophen] Nausea Only   Percocet [Oxycodone-Acetaminophen] Nausea Only   Penicillins Rash and Other (See Comments)    Has patient had a PCN reaction causing immediate rash, facial/tongue/throat swelling, SOB or lightheadedness with hypotension: Yes Has patient had a PCN reaction causing severe rash involving mucus membranes or skin necrosis: No Has patient had a PCN reaction that required hospitalization No Has patient had a PCN reaction occurring within the last 10 years: Yes If all of the above answers are "NO", then may proceed with Cephalosporin use.     Family History  Problem Relation Age of Onset   Hypertension Son    Alzheimer's disease Mother    Kidney disease Mother    Kidney disease Father    Cancer Maternal Aunt        leukemia   Liver disease Maternal Uncle    Kidney disease Paternal Uncle        kidney removed   Diabetes Maternal Grandmother    Diabetes Maternal Grandfather    Cancer Paternal Grandmother        breast, liver   Colon cancer Neg Hx     Prior to Admission medications   Medication Sig Start Date End Date Taking? Authorizing Provider  acetaminophen (TYLENOL) 500 MG tablet Take 500 mg by mouth every 6 (six) hours as needed for moderate pain or headache.   Yes [provider]  Cholecalciferol (VITAMIN D) 2000 UNITS CAPS Take 2,000 Units by  mouth daily.   Yes [provider]  cyanocobalamin 1000 MCG tablet Take 1,000 mcg by mouth daily.   Yes [provider]  diazepam (VALIUM) 5 MG tablet Take 5 mg by mouth every 8 (eight) hours as needed for anxiety.   Yes [provider]  dicyclomine (BENTYL) 20 MG tablet Take 20 mg by mouth 4 (four) times daily as needed for spasms.   Yes [provider]  ferrous sulfate 325 (65 FE) MG tablet Take 1 tablet (325 mg total) by mouth daily with breakfast. 10/25/16  Yes Rehman, Mechele Dawley, MD  gabapentin (NEURONTIN) 300 MG capsule Take 300 mg by mouth at bedtime.   Yes [provider]  HYDROcodone-acetaminophen (NORCO/VICODIN)  5-325 MG tablet Take 1 tablet by mouth every 8 (eight) hours as needed for severe pain. 12/12/22  Yes [provider]  metFORMIN (GLUCOPHAGE) 500 MG tablet Take 500 mg by mouth 2 (two) times daily with a meal.    Yes [provider]  montelukast (SINGULAIR) 10 MG tablet Take 10 mg by mouth every morning.   Yes [provider]  omeprazole (PRILOSEC) 20 MG capsule TAKE 1 CAPSULE(20 MG) BY MOUTH TWICE DAILY BEFORE A MEAL 02/22/22  Yes Rehman, Mechele Dawley, MD  PEPPERMINT OIL PO Take 1 capsule by mouth daily. Intestinal Defense With Ginger and Fennel   Yes [provider]  POTASSIUM PO Take 1 tablet by mouth daily.   Yes [provider]  Probiotic Product (PROBIOTIC DAILY PO) Take 1 capsule by mouth daily.   Yes [provider]  rivaroxaban (XARELTO) 20 MG TABS tablet Take 20 mg by mouth daily with supper.   Yes [provider]  Turmeric 450 MG CAPS Take 450 mg by mouth daily.   Yes [provider]  amitriptyline (ELAVIL) 10 MG tablet Take 1 tablet (10 mg total) by mouth at bedtime. Patient not taking: Reported on 11/28/2022 10/04/22   Harvel Quale, MD  traMADol (ULTRAM) 50 MG tablet Take 50 mg by mouth 4 (four) times daily. Patient not taking: Reported on 12/28/2022  11/24/22   [provider]    Physical Exam: Vitals:   12/29/22 0000 12/29/22 0200 12/29/22 0215 12/29/22 0240  BP: 133/70 130/60    Pulse: 67 72 71   Resp: '13 17 16   '$ Temp:    98 F (36.7 C)  TempSrc:    Oral  SpO2: 100% 92% 93%   Weight:      Height:       1.  General: Patient lying supine in bed,  no acute distress   2. Psychiatric: Alert and oriented x 3, mood and behavior normal for situation, pleasant and cooperative with exam   3. Neurologic: Speech and language are normal, face is symmetric, moves all 4 extremities voluntarily, at baseline without acute deficits on limited exam   4. HEENMT:  Head is atraumatic, normocephalic, pupils reactive to light, neck is supple, trachea is midline, mucous membranes are moist   5. Respiratory : Lungs are clear to auscultation bilaterally without wheezing, rhonchi, rales, no cyanosis, no increase in work of breathing or accessory muscle use   6. Cardiovascular : Heart rate normal, rhythm is regular, heart murmur present, rubs or gallops, no peripheral edema, peripheral pulses palpated   7. Gastrointestinal:  Abdomen is soft, nondistended, nontender to palpation bowel sounds active, no masses or organomegaly palpated   8. Skin:  Skin is warm, dry and intact without rashes, acute lesions, or ulcers on limited exam   9.Musculoskeletal:  No acute deformities or trauma, no asymmetry in tone, no peripheral edema, peripheral pulses palpated, no tenderness to palpation in the extremities  Data Reviewed: In the ED Temp 98.1, heart rate 91-108, respiratory rate 15-26, blood pressure 157/77-194/103 Satting 91-98% Leukocytosis at 12.4, hemoglobin 12.7, platelets 297 Chemistries unremarkable UA is suspicious for UTI, a procalcitonin negative CT renal study shows moderate left hydroureteronephrosis with 3 stones in the distal ureter Urology consulted and recommends admission to [place] and Plan: * Flank  pain - Left flank pain - Recent lithotripsy done December 29 - CT renal study shows moderate left hydroureteronephrosis with 3 stones/stone fragments within the distal left ureter -  UA suspicious for  UTI - Procalcitonin less than 0.10 - Urine culture pending - Continue pain control with pain scale - Admit to [facility] for urology consult  Anxiety - Continue reduced dose of Valium  Controlled type 2 diabetes mellitus (HCC) - Continue sliding scale coverage  GERD (gastroesophageal reflux disease) - Continue PPI  Neuropathy - Continue gabapentin      Advance Care Planning:   Code Status: Full Code  Consults: Urology  Family Communication: No family at bedside  Severity of Illness: The appropriate patient status for this patient is OBSERVATION. Observation status is judged to be reasonable and necessary in order to provide the required intensity of service to ensure the patient's safety. The patient's presenting symptoms, physical exam findings, and initial radiographic and laboratory data in the context of their medical condition is felt to place them at decreased risk for further clinical deterioration. Furthermore, it is anticipated that the patient will be medically stable for discharge from the hospital within 2 midnights of admission.   Author: Rolla Plate, DO 12/29/2022 4:00 AM  For on call review [email or web address].

## 2022-12-29 NOTE — ED Notes (Signed)
NT to consult [facility] to find pt a hospital bed

## 2022-12-29 NOTE — Progress Notes (Addendum)
PROGRESS NOTE    Patient: [name]                            PCP: Sharilyn Sites, MD                    DOB: 1946/02/27            DOA: 12/28/2022 JAS:[id number]             DOS: 12/29/2022, 12:07 PM   LOS: 0 days   Date of Service: The patient was seen and examined on 12/29/2022  Subjective:   The patient was seen and examined this morning. Medically stable... Pain is tolerable with pain medication  Discussed the case with urology Dr. [facility]ian[facility] Ket can stay at [facility] Recommending no intervention at this point  The patient and her husband are agreeable  Brief Narrative:   [name] is a 77 y.o. female with medical history significant of anxiety, GERD, heart murmur, neuropathy, type 2 diabetes mellitus, history of DVT/PE on Xarelto, and more presents the ED with a chief complaint of left flank pain.  The pain started at 11 AM on the same day as presentation.  Patient reports associated nausea but no vomiting.  She describes the pain is feeling like there is a heaviness or rocking her side.  She denies any change in hematuria, but reports that she has continued hematuria since the lithotripsy.  Patient denies any fever.  She reports she did not try any pain medication specifically for this before she came into the hospital.  She did take a hydrocodone that morning so that she could "prevent pain."  Patient reports she has been struggling with flank pain since October.  She has been treating for many UTI leading up to this procedure.  Patient reports that she has had 2 loose stools in 1 day.  She has no other complaints.   ED: Temp Max 98.1, HR  91-108, RR 15-26, blood pressure 157/77-194/103 Satting 91-98% Leukocytosis at 12.4, hemoglobin 12.7, platelets 297 Chemistries unremarkable UA is suspicious for UTI, a procalcitonin negative CT renal study shows moderate left hydroureteronephrosis with 3 stones in the distal ureter  Urology consulted and was admission to [place] with Dr. Alyson Ingles this morning who seen and evaluate the patient, recommending continue fluid hydration no intervention Patient can stay at independent    Assessment & Plan:   Principal Problem:   Flank pain Active Problems:   Neuropathy   GERD (gastroesophageal reflux disease)   Controlled type 2 diabetes mellitus ([place])   Anxiety   Kidney stones     Assessment and Plan: * Flank pain -Hemodynamically stable, afebrile, normotensive - Proved left flank pain with current pain medication regimen - Recent lithotripsy done December 29/2023 - CT renal study shows moderate left hydroureteronephrosis with 3 stones/stone fragments within the distal left ureter - UA suspicious for  UTI - Procalcitonin less than 0.10 - Urine culture pending - Continue pain control with pain scale - Urology consulted Dr. Alyson Ingles: Has seen evaluate patient recommending no further intervention, continue hydration and diuresing  Anxiety - Continue reduced dose of Valium  Controlled type 2 diabetes mellitus (HCC) - Continue sliding scale coverage  GERD (gastroesophageal reflux disease) - Continue PPI  Neuropathy - Continue gabapentin   ----------------------------------------------------------------------------------------------------------------------------------------------- Nutritional status:  The patient's BMI is: Body mass index is 33.13 kg/m. I agree with the assessment and plan as outlined  b ------------------------------------------------------------------------------------------------------------------------------------- Cultures;  Urine Culture  >>> NGT  -------------------------------------------------------------------------------------------------------------------------------------  DVT prophylaxis:  SCDs Start: 12/29/22 0035   Code Status:   Code Status: Full Code  Family Communication: No family member present at bedside- attempt will be made to update  daily The above findings and plan of care has been discussed with patient (and family)  in detail,  they expressed understanding and agreement of above. -Advance care planning has been discussed.   Admission status:   Status is: Inpatient Remains inpatient appropriate because: Requiring significant pain management, IV fluid, consultant input     Procedures:   No admission procedures for hospital encounter.   Antimicrobials:  Anti-infectives (From admission, onward)    None        Medication:   furosemide  40 mg Intravenous Once   gabapentin  300 mg Oral QHS   insulin aspart  0-15 Units Subcutaneous TID WC   insulin aspart  0-5 Units Subcutaneous QHS   ketorolac  30 mg Intravenous Once   montelukast  10 mg Oral q morning   pantoprazole  40 mg Oral Daily   tamsulosin  0.4 mg Oral QPC supper    acetaminophen **OR** acetaminophen, diazepam, HYDROcodone-acetaminophen, morphine injection, ondansetron **OR** ondansetron (ZOFRAN) IV   Objective:   Vitals:   12/29/22 0930 12/29/22 1000 12/29/22 1030 12/29/22 1044  BP: (!) 149/62 (!) 149/67 (!) 140/69   Pulse: 65 71 66   Resp: '18 17 17   '$ Temp:    98 F (36.7 C)  TempSrc:    Oral  SpO2: 95% 95% 96%   Weight:      Height:        Intake/Output Summary (Last 24 hours) at 12/29/2022 1207 Last data filed at 12/29/2022 7619 Gross per 24 hour  Intake 2767.48 ml  Output --  Net 2767.48 ml   Filed Weights   12/28/22 1452  Weight: 84.8 kg     Examination:   Physical Exam  Constitution:  Alert, cooperative, no distress,  Appears calm and comfortable  Psychiatric:   Normal and stable mood and affect, cognition intact,   HEENT:        Normocephalic, PERRL, otherwise with in Normal limits  Chest:         Chest symmetric Cardio vascular:  S1/S2, RRR, No murmure, No Rubs or Gallops  pulmonary: Clear to auscultation bilaterally, respirations unlabored, negative wheezes / crackles Abdomen: CVA tenderness, otherwise soft,   non-distended, bowel sounds,no masses, no organomegaly Muscular skeletal: Limited exam - in bed, able to move all 4 extremities,   Neuro: CNII-XII intact. , normal motor and sensation, reflexes intact  Extremities: No pitting edema lower extremities, +2 pulses  Skin: Dry, warm to touch, negative for any Rashes, No open wounds Wounds: per nursing documentation   ------------------------------------------------------------------------------------------------------------------------------------------    LABs:     Latest Ref Rng & Units 12/29/2022    4:23 AM 12/28/2022    3:41 PM 12/21/2022   11:12 AM  CBC  WBC 4.0 - 10.5 K/uL 8.0  12.4  6.1   Hemoglobin 12.0 - 15.0 g/dL 10.5  12.7  12.8   Hematocrit 36.0 - 46.0 % 32.6  39.7  40.4   Platelets 150 - 400 K/uL 242  297  288       Latest Ref Rng & Units 12/29/2022    4:23 AM 12/28/2022    3:41 PM 12/21/2022   11:12 AM  CMP  Glucose 70 - 99 mg/dL 129  110  119   BUN 8 - 23 mg/dL '17  18  20   '$ Creatinine 0.44 - 1.00 mg/dL 1.09  1.13  1.05   Sodium 135 - 145 mmol/L 135  139  143   Potassium 3.5 - 5.1 mmol/L 3.9  4.3  4.8   Chloride 98 - 111 mmol/L 102  106  111   CO2 22 - 32 mmol/L '25  25  27   '$ Calcium 8.9 - 10.3 mg/dL 8.6  9.5  10.1   Total Protein 6.5 - 8.1 g/dL 5.6  6.9    Total Bilirubin 0.3 - 1.2 mg/dL 0.6  0.5    Alkaline Phos 38 - 126 U/L 48  61    AST 15 - 41 U/L 17  18    ALT 0 - 44 U/L 19  22         Micro Results No results found for this or any previous visit (from the past 240 hour(s)).  Radiology Reports CT ABDOMEN PELVIS W CONTRAST  Result Date: 12/28/2022 CLINICAL DATA:  Abdominal pain, post-op Left flank pain, onset today.  Cystoscopy on Friday. EXAM: CT ABDOMEN AND PELVIS WITH CONTRAST TECHNIQUE: Multidetector CT imaging of the abdomen and pelvis was performed using the standard protocol following bolus administration of intravenous contrast. RADIATION DOSE REDUCTION: This exam was performed according to the  departmental dose-optimization program which includes automated exposure control, adjustment of the mA and/or kV according to patient size and/or use of iterative reconstruction technique. CONTRAST:  172m OMNIPAQUE IOHEXOL 300 MG/ML  SOLN COMPARISON:  Noncontrast CT 12/14/2022 FINDINGS: Lower chest: Large hiatal hernia with the majority of the stomach being intrathoracic. Stable gastric configuration. Compressive atelectasis in the left lower lobe related to hiatal hernia. Linear atelectasis in the right lung base. No pleural fluid. Hepatobiliary: Diffuse hepatic steatosis. Occasional tiny hepatic cysts, needing no further follow-up. Clips in the gallbladder fossa postcholecystectomy. No biliary dilatation. Pancreas: No ductal dilatation or inflammation. Spleen: Normal in size without focal abnormality. Adrenals/Urinary Tract: Normal adrenal glands. There is moderate left hydroureteronephrosis. Three stones or stone fragment within the distal left ureter, largest measuring 5-6 mm distally. There is mild left ureteral enhancement as well as thickening distally. Moderate left perinephric edema with diminished left renal enhancement and absent excretion on delayed phase imaging. Left intrarenal calculi including a 19 mm stone in the lower pole. Extrarenal pelvis configuration of the right kidney. No right urolithiasis. Cyst arising from the upper pole of the right kidney with a thin peripheral wall calcification. No further follow-up is recommended. Minimal wall thickening involving the base of the left aspect of the urinary bladder. Air in the nondependent bladder is likely related to recent instrumentation. No perivesicular fluid. Stomach/Bowel: Large hiatal hernia with the majority of the stomach being intrathoracic. Stable gastric configuration. The hiatal hernia also contains a moderate length segment of transverse colon. The enteric hernia is not included in the field of view. No small or large bowel obstruction  or inflammatory change. High-riding cecum in the right mid abdomen. Appendectomy per history. Vascular/Lymphatic: Mild aortic and branch atherosclerosis. No aortic aneurysm. Patent portal and splenic veins. No adenopathy. Reproductive: Uterus and bilateral adnexa are unremarkable. Other: Trace free fluid in the pelvis. Left retroperitoneal stranding. No free air. No focal fluid collection. Small fat containing umbilical hernia. Musculoskeletal: Stable osseous structures. Minimal L2 superior endplate compression deformity is chronic (there are 4 non-rib-bearing lumbar vertebra). IMPRESSION: 1. Moderate left hydroureteronephrosis with three stones or stone fragment within the distal  left ureter, largest measuring 5-6 mm distally. There is mild left ureteral enhancement as well as thickening distally. Moderate left perinephric edema with diminished left renal enhancement and absent excretion on delayed phase imaging. Recommend correlation with urinalysis to assess for infection. 2. Left intrarenal calculi including a 19 mm stone in the lower pole. 3. Minimal wall thickening involving the base of the left aspect of the urinary bladder, likely reactive. Air in the nondependent bladder is likely related to recent instrumentation. 4. Large hiatal hernia with the majority of the stomach being intrathoracic. The hiatal hernia also contains a moderate length segment of transverse colon. 5. Hepatic steatosis. Electronically Signed   By: Keith Rake M.D.   On: 12/28/2022 18:01    SIGNED: Deatra James, MD, FHM. Total of 35 minutes of spent seeing evaluating this patient drawn plan of care-discussed consult   [facility],  Pager (please use [email or web address] to page/text) Please use Epic Secure Chat for non-urgent communication (7AM-7PM)  If 7PM-7AM, please contact night-coverage [email or web address], 12/29/2022, 12:07 PM

## 2022-12-29 NOTE — Hospital Course (Addendum)
[name] is a 77 y.o. female with medical history significant of anxiety, GERD, heart murmur, neuropathy, type 2 diabetes mellitus, history of DVT/PE on Xarelto, and more presents the ED with a chief complaint of left flank pain.  The pain started at 11 AM on the same day as presentation.  Patient reports associated nausea but no vomiting.  She describes the pain is feeling like there is a heaviness or rocking her side.  She denies any change in hematuria, but reports that she has continued hematuria since the lithotripsy.  Patient denies any fever.  She reports she did not try any pain medication specifically for this before she came into the hospital.  She did take a hydrocodone that morning so that she could "prevent pain."  Patient reports she has been struggling with flank pain since October.  She has been treating for many UTI leading up to this procedure.  Patient reports that she has had 2 loose stools in 1 day.  She has no other complaints.   ED: Temp Max 98.1, HR  91-108, RR 15-26, blood pressure 157/77-194/103 Satting 91-98% Leukocytosis at 12.4, hemoglobin 12.7, platelets 297 Chemistries unremarkable UA is suspicious for UTI, a procalcitonin negative CT renal study shows moderate left hydroureteronephrosis with 3 stones in the distal ureter  Urology consulted and was admission to [facility] Discussed with Dr. Alyson Ingles this morning who seen and evaluate the patient, recommending continue fluid hydration no intervention Patient can stay at independent

## 2022-12-29 NOTE — Assessment & Plan Note (Signed)
Continue gabapentin.

## 2022-12-29 NOTE — ED Notes (Signed)
Patient stood up and used side toilet

## 2022-12-29 NOTE — Assessment & Plan Note (Signed)
Continue PPI ?

## 2022-12-29 NOTE — ED Notes (Signed)
Pt moved in hospital bed for comfort.

## 2022-12-29 NOTE — Assessment & Plan Note (Addendum)
-  Still complaining of left flank pain, -Hemodynamically stable, afebrile, normotensive - Continue pain management, modify analgesic for better pain control  - Recent lithotripsy done December 29/2023 - CT renal study shows moderate left hydroureteronephrosis with 3 stones/stone fragments within the distal left ureter - UA suspicious for  UTI - Procalcitonin less than 0.10 - Urine culture pending  - Urology consulted Dr. Alyson Ingles: Has seen evaluate patient recommending no further intervention, continue hydration and diuresing -Monitoring H&H, and hematuria this morning -Discontinuing DVT prophylaxis of heparin, continue with SCDs

## 2022-12-29 NOTE — ED Notes (Signed)
Pt states no needs at this time.

## 2022-12-30 DIAGNOSIS — R109 Unspecified abdominal pain: Secondary | ICD-10-CM | POA: Diagnosis not present

## 2022-12-30 LAB — BASIC METABOLIC PANEL
Anion gap: 7 (ref 5–15)
BUN: 16 mg/dL (ref 8–23)
CO2: 27 mmol/L (ref 22–32)
Calcium: 8.8 mg/dL — ABNORMAL LOW (ref 8.9–10.3)
Chloride: 105 mmol/L (ref 98–111)
Creatinine, Ser: 1.07 mg/dL — ABNORMAL HIGH (ref 0.44–1.00)
GFR, Estimated: 54 mL/min — ABNORMAL LOW (ref >60)
Glucose, Bld: 108 mg/dL — ABNORMAL HIGH (ref 70–99)
Potassium: 3.4 mmol/L — ABNORMAL LOW (ref 3.5–5.1)
Sodium: 139 mmol/L (ref 135–145)

## 2022-12-30 LAB — CBC
HCT: 32.4 % — ABNORMAL LOW (ref 36.0–46.0)
Hemoglobin: 10.3 g/dL — ABNORMAL LOW (ref 12.0–15.0)
MCH: 29.1 pg (ref 26.0–34.0)
MCHC: 31.8 g/dL (ref 30.0–36.0)
MCV: 91.5 fL (ref 80.0–100.0)
Platelets: 248 10*3/uL (ref 150–400)
RBC: 3.54 MIL/uL — ABNORMAL LOW (ref 3.87–5.11)
RDW: 13.9 % (ref 11.5–15.5)
WBC: 5.6 10*3/uL (ref 4.0–10.5)
nRBC: 0 % (ref 0.0–0.2)

## 2022-12-30 LAB — URINE CULTURE

## 2022-12-30 LAB — GLUCOSE, CAPILLARY
Glucose-Capillary: 102 mg/dL — ABNORMAL HIGH (ref 70–99)
Glucose-Capillary: 156 mg/dL — ABNORMAL HIGH (ref 70–99)
Glucose-Capillary: 136 mg/dL — ABNORMAL HIGH (ref 70–99)
Glucose-Capillary: 112 mg/dL — ABNORMAL HIGH (ref 70–99)

## 2022-12-30 MED ORDER — POTASSIUM CHLORIDE CRYS ER 20 MEQ PO TBCR
40.0000 meq | EXTENDED_RELEASE_TABLET | Freq: Once | ORAL | Status: AC
  Administered 2022-12-30: 40 meq via ORAL
  Filled 2022-12-30: qty 2

## 2022-12-30 MED ORDER — FUROSEMIDE 10 MG/ML IJ SOLN
40.0000 mg | Freq: Once | INTRAMUSCULAR | Status: AC
  Administered 2022-12-30: 40 mg via INTRAVENOUS
  Filled 2022-12-30: qty 4

## 2022-12-30 NOTE — Progress Notes (Signed)
Subjective: Patient reports mild left flank pain which is improved with pain medication. She is having new urinary urgency, bladder spasms and gross hematuria  Objective: Vital signs in last 24 hours: Temp:  [98 F (36.7 C)-99.8 F (37.7 C)] 99.8 F (37.7 C) (01/05 1224) Pulse Rate:  [73-99] 99 (01/05 1224) Resp:  [17-35] 20 (01/05 0432) BP: (133-154)/(66-77) 137/71 (01/05 1224) SpO2:  [93 %-98 %] 96 % (01/05 1224) Weight:  [88.2 kg] 88.2 kg (01/04 1735)  Intake/Output from previous day: 01/04 0701 - 01/05 0700 In: 1240 [P.O.:240; I.V.:1000] Out: -  Intake/Output this shift: Total I/O In: 240 [P.O.:240] Out: -   Physical Exam:  General:alert, cooperative, and appears stated age GI: soft, non tender, normal bowel sounds, no palpable masses, no organomegaly, no inguinal hernia Female genitalia: not done Extremities: extremities normal, atraumatic, no cyanosis or edema  Lab Results: Recent Labs    12/28/22 1541 12/29/22 0423 12/30/22 0334  HGB 12.7 10.5* 10.3*  HCT 39.7 32.6* 32.4*   BMET Recent Labs    12/29/22 0423 12/30/22 0334  NA 135 139  K 3.9 3.4*  CL 102 105  CO2 25 27  GLUCOSE 129* 108*  BUN 17 16  CREATININE 1.09* 1.07*  CALCIUM 8.6* 8.8*   No results for input(s): "LABPT", "INR" in the last 72 hours. No results for input(s): "LABURIN" in the last 72 hours. Results for orders placed or performed during the hospital encounter of 12/28/22  Urine Culture     Status: Abnormal   Collection Time: 12/28/22  6:50 PM   Specimen: Urine, Clean Catch  Result Value Ref Range Status   Specimen Description   Final    URINE, CLEAN CATCH Performed at [facility], [address], [place], [place] [address]    Special Requests   Final    NONE Performed at [facility], [address], [place], [place] [address]    Culture MULTIPLE SPECIES PRESENT, SUGGEST RECOLLECTION (A)  Final   Report Status 12/30/2022 FINAL  Final    Studies/Results: CT ABDOMEN  PELVIS W CONTRAST  Result Date: 12/28/2022 CLINICAL DATA:  Abdominal pain, post-op Left flank pain, onset today.  Cystoscopy on Friday. EXAM: CT ABDOMEN AND PELVIS WITH CONTRAST TECHNIQUE: Multidetector CT imaging of the abdomen and pelvis was performed using the standard protocol following bolus administration of intravenous contrast. RADIATION DOSE REDUCTION: This exam was performed according to the departmental dose-optimization program which includes automated exposure control, adjustment of the mA and/or kV according to patient size and/or use of iterative reconstruction technique. CONTRAST:  145m OMNIPAQUE IOHEXOL 300 MG/ML  SOLN COMPARISON:  Noncontrast CT 12/14/2022 FINDINGS: Lower chest: Large hiatal hernia with the majority of the stomach being intrathoracic. Stable gastric configuration. Compressive atelectasis in the left lower lobe related to hiatal hernia. Linear atelectasis in the right lung base. No pleural fluid. Hepatobiliary: Diffuse hepatic steatosis. Occasional tiny hepatic cysts, needing no further follow-up. Clips in the gallbladder fossa postcholecystectomy. No biliary dilatation. Pancreas: No ductal dilatation or inflammation. Spleen: Normal in size without focal abnormality. Adrenals/Urinary Tract: Normal adrenal glands. There is moderate left hydroureteronephrosis. Three stones or stone fragment within the distal left ureter, largest measuring 5-6 mm distally. There is mild left ureteral enhancement as well as thickening distally. Moderate left perinephric edema with diminished left renal enhancement and absent excretion on delayed phase imaging. Left intrarenal calculi including a 19 mm stone in the lower pole. Extrarenal pelvis configuration of the right kidney. No right urolithiasis. Cyst arising from  the upper pole of the right kidney with a thin peripheral wall calcification. No further follow-up is recommended. Minimal wall thickening involving the base of the left aspect of the  urinary bladder. Air in the nondependent bladder is likely related to recent instrumentation. No perivesicular fluid. Stomach/Bowel: Large hiatal hernia with the majority of the stomach being intrathoracic. Stable gastric configuration. The hiatal hernia also contains a moderate length segment of transverse colon. The enteric hernia is not included in the field of view. No small or large bowel obstruction or inflammatory change. High-riding cecum in the right mid abdomen. Appendectomy per history. Vascular/Lymphatic: Mild aortic and branch atherosclerosis. No aortic aneurysm. Patent portal and splenic veins. No adenopathy. Reproductive: Uterus and bilateral adnexa are unremarkable. Other: Trace free fluid in the pelvis. Left retroperitoneal stranding. No free air. No focal fluid collection. Small fat containing umbilical hernia. Musculoskeletal: Stable osseous structures. Minimal L2 superior endplate compression deformity is chronic (there are 4 non-rib-bearing lumbar vertebra). IMPRESSION: 1. Moderate left hydroureteronephrosis with three stones or stone fragment within the distal left ureter, largest measuring 5-6 mm distally. There is mild left ureteral enhancement as well as thickening distally. Moderate left perinephric edema with diminished left renal enhancement and absent excretion on delayed phase imaging. Recommend correlation with urinalysis to assess for infection. 2. Left intrarenal calculi including a 19 mm stone in the lower pole. 3. Minimal wall thickening involving the base of the left aspect of the urinary bladder, likely reactive. Air in the nondependent bladder is likely related to recent instrumentation. 4. Large hiatal hernia with the majority of the stomach being intrathoracic. The hiatal hernia also contains a moderate length segment of transverse colon. 5. Hepatic steatosis. Electronically Signed   By: Keith Rake M.D.   On: 12/28/2022 18:01    Assessment/Plan: 77yo with ureteral  calculi  Continue medical expulsive therapy including flomax, zofran and pain medication. If here pain continues to be controlled on oral pain medication she can be discharged home and followup in 2 weeks with Dr. Junious Silk   LOS: 1 day   [name] 12/30/2022, 1:17 PM

## 2022-12-30 NOTE — Care Management Important Message (Signed)
Important Message  Patient Details  Name: [name] MRN: [id number] Date of Birth: 09-25-1946   Medicare Important Message Given:  Yes     Tommy Medal 12/30/2022, 12:49 PM

## 2022-12-30 NOTE — Progress Notes (Signed)
PROGRESS NOTE    Patient: [name]                            PCP: Sharilyn Sites, MD                    DOB: [birth year]            DOA: 12/28/2022 [id number]             DOS: 12/30/2022, 12:24 PM   LOS: 1 day   Date of Service: The patient was seen and examined on 12/30/2022  Subjective:   The patient was seen and examined this morning, still complaining of left flank pain, especially with any movement. The patient and her husband reporting of hematuria this morning  Brief Narrative:   [name] is a 77 y.o. female with medical history significant of anxiety, GERD, heart murmur, neuropathy, type 2 diabetes mellitus, history of DVT/PE on Xarelto, and more presents the ED with a chief complaint of left flank pain.  The pain started at 11 AM on the same day as presentation.  Patient reports associated nausea but no vomiting.  She describes the pain is feeling like there is a heaviness or rocking her side.  She denies any change in hematuria, but reports that she has continued hematuria since the lithotripsy.  Patient denies any fever.  She reports she did not try any pain medication specifically for this before she came into the hospital.  She did take a hydrocodone that morning so that she could "prevent pain."  Patient reports she has been struggling with flank pain since October.  She has been treating for many UTI leading up to this procedure.  Patient reports that she has had 2 loose stools in 1 day.  She has no other complaints.   ED: Temp Max 98.1, HR  91-108, RR 15-26, blood pressure 157/77-194/103 Satting 91-98% Leukocytosis at 12.4, hemoglobin 12.7, platelets 297 Chemistries unremarkable UA is suspicious for UTI, a procalcitonin negative CT renal study shows moderate left hydroureteronephrosis with 3 stones in the distal ureter  Urology consulted and was admission to [facility] with Dr. Alyson Ingles this morning who seen and evaluate the patient, recommending  continue fluid hydration no intervention Patient can stay at independent    Assessment & Plan:   Principal Problem:   Flank pain Active Problems:   Neuropathy   GERD (gastroesophageal reflux disease)   Controlled type 2 diabetes mellitus ([place])   Anxiety   Kidney stones   Ureteral colic     Assessment and Plan: * Flank pain -Still complaining of left flank pain, -Hemodynamically stable, afebrile, normotensive - Continue pain management, modify analgesic for better pain control  - Recent lithotripsy done December 29/2023 - CT renal study shows moderate left hydroureteronephrosis with 3 stones/stone fragments within the distal left ureter - UA suspicious for  UTI - Procalcitonin less than 0.10 - Urine culture pending  - Urology consulted Dr. Alyson Ingles: Has seen evaluate patient recommending no further intervention, continue hydration and diuresing -Monitoring H&H, and hematuria this morning -Discontinuing DVT prophylaxis of heparin, continue with SCDs  Anxiety - Continue reduced dose of Valium  Controlled type 2 diabetes mellitus ([facility]) - Continue sliding scale coverage  GERD (gastroesophageal reflux disease) - Continue PPI  Neuropathy - Continue gabapentin   ----------------------------------------------------------------------------------------------------------------------------------------------- Nutritional status:  The patient's BMI is: Body mass index is 34.44 kg/m. I agree with the  assessment and plan as outlined b ------------------------------------------------------------------------------------------------------------------------------------- Cultures;  Urine Culture  >>> NGT  -------------------------------------------------------------------------------------------------------------------------------------  DVT prophylaxis:  Place and maintain sequential compression device Start: 12/30/22 1016 SCDs Start: 12/29/22 0035   Code Status:   Code  Status: Full Code  Family Communication: No family member present at bedside- attempt will be made to update daily The above findings and plan of care has been discussed with patient (and family)  in detail,  they expressed understanding and agreement of above. -Advance care planning has been discussed.   Admission status:   Status is: Inpatient Remains inpatient appropriate because: Requiring significant pain management, IV fluid, consultant input     Procedures:   No admission procedures for hospital encounter.   Antimicrobials:  Anti-infectives (From admission, onward)    None        Medication:   gabapentin  300 mg Oral QHS   insulin aspart  0-15 Units Subcutaneous TID WC   insulin aspart  0-5 Units Subcutaneous QHS   ketorolac  30 mg Intravenous Once   montelukast  10 mg Oral q morning   pantoprazole  40 mg Oral Daily   tamsulosin  0.4 mg Oral QPC supper    acetaminophen **OR** acetaminophen, diazepam, HYDROcodone-acetaminophen, morphine injection, ondansetron **OR** ondansetron (ZOFRAN) IV   Objective:   Vitals:   12/29/22 1633 12/29/22 1735 12/29/22 2051 12/30/22 0432  BP:  (!) 146/73 (!) 153/68 (!) 154/77  Pulse:  81 77 74  Resp:  '19 18 20  '$ Temp: 98 F (36.7 C) 98.1 F (36.7 C) 98.1 F (36.7 C) 98.3 F (36.8 C)  TempSrc: Oral Oral  Oral  SpO2:  98% 97% 96%  Weight:  88.2 kg    Height:  '5\' 3"'$  (1.6 m)      Intake/Output Summary (Last 24 hours) at 12/30/2022 1224 Last data filed at 12/30/2022 0900 Gross per 24 hour  Intake 712.52 ml  Output --  Net 712.52 ml   Filed Weights   12/28/22 1452 12/29/22 1735  Weight: 84.8 kg 88.2 kg     Examination:    General:  AAO x 3,  cooperative, no distress;   HEENT:  Normocephalic, PERRL, otherwise with in Normal limits   Neuro:  CNII-XII intact. , normal motor and sensation, reflexes intact   Lungs:   Clear to auscultation BL, Respirations unlabored,  No wheezes / crackles  Cardio:    S1/S2, RRR, No  murmure, No Rubs or Gallops   Abdomen:  Soft, non-tender, bowel sounds active all four quadrants, no guarding or peritoneal signs.  Muscular  skeletal:  Left CVA tenderness  Limited exam -global generalized weaknesses - in bed, able to move all 4 extremities,   2+ pulses,  symmetric, No pitting edema  Skin:  Dry, warm to touch, negative for any Rashes,  Wounds: Please see nursing documentation         ------------------------------------------------------------------------------------------------------------------------------------------    LABs:     Latest Ref Rng & Units 12/30/2022    3:34 AM 12/29/2022    4:23 AM 12/28/2022    3:41 PM  CBC  WBC 4.0 - 10.5 K/uL 5.6  8.0  12.4   Hemoglobin 12.0 - 15.0 g/dL 10.3  10.5  12.7   Hematocrit 36.0 - 46.0 % 32.4  32.6  39.7   Platelets 150 - 400 K/uL 248  242  297       Latest Ref Rng & Units 12/30/2022    3:34 AM 12/29/2022    4:23  AM 12/28/2022    3:41 PM  CMP  Glucose 70 - 99 mg/dL 108  129  110   BUN 8 - 23 mg/dL '16  17  18   '$ Creatinine 0.44 - 1.00 mg/dL 1.07  1.09  1.13   Sodium 135 - 145 mmol/L 139  135  139   Potassium 3.5 - 5.1 mmol/L 3.4  3.9  4.3   Chloride 98 - 111 mmol/L 105  102  106   CO2 22 - 32 mmol/L '27  25  25   '$ Calcium 8.9 - 10.3 mg/dL 8.8  8.6  9.5   Total Protein 6.5 - 8.1 g/dL  5.6  6.9   Total Bilirubin 0.3 - 1.2 mg/dL  0.6  0.5   Alkaline Phos 38 - 126 U/L  48  61   AST 15 - 41 U/L  17  18   ALT 0 - 44 U/L  19  22        Micro Results Recent Results (from the past 240 hour(s))  Urine Culture     Status: Abnormal   Collection Time: 12/28/22  6:50 PM   Specimen: Urine, Clean Catch  Result Value Ref Range Status   Specimen Description   Final    URINE, CLEAN CATCH Performed at [facility], [address] [place], [place] [address]    Special Requests   Final    NONE Performed at [facility], [address] [place], [place] [address]    Culture MULTIPLE SPECIES PRESENT, SUGGEST RECOLLECTION (A)   Final   Report Status 12/30/2022 FINAL  Final    Radiology Reports No results found.  SIGNED: Deatra James, MD, FHM. [facility],  Pager (please use [email or web address] to page/text) Please use Epic Secure Chat for non-urgent communication (7AM-7PM)  If 7PM-7AM, please contact night-coverage [email or web address], 12/30/2022, 12:24 PM

## 2022-12-30 NOTE — Progress Notes (Signed)
Patient slept most of the night during this shift. Patient got up independently to the bathroom during the night. I asked patient about the color of her urine and she stated, " it is back to yellow color. No complaints of pain or discomfort. No prn medications given. Most recent vitals signs are T 98.3 P 71 RR 20 B/P 154/77 O2 sat 96% on RA.

## 2022-12-30 NOTE — Progress Notes (Signed)
Patient teeth brushed, given bath and partials placed in denture cup.

## 2022-12-31 DIAGNOSIS — R109 Unspecified abdominal pain: Secondary | ICD-10-CM | POA: Diagnosis not present

## 2022-12-31 LAB — CBC
HCT: 34 % — ABNORMAL LOW (ref 36.0–46.0)
Hemoglobin: 11 g/dL — ABNORMAL LOW (ref 12.0–15.0)
MCH: 29.4 pg (ref 26.0–34.0)
MCHC: 32.4 g/dL (ref 30.0–36.0)
MCV: 90.9 fL (ref 80.0–100.0)
Platelets: 251 10*3/uL (ref 150–400)
RBC: 3.74 MIL/uL — ABNORMAL LOW (ref 3.87–5.11)
RDW: 13.8 % (ref 11.5–15.5)
WBC: 13 10*3/uL — ABNORMAL HIGH (ref 4.0–10.5)
nRBC: 0 % (ref 0.0–0.2)

## 2022-12-31 LAB — GLUCOSE, CAPILLARY: Glucose-Capillary: 120 mg/dL — ABNORMAL HIGH (ref 70–99)

## 2022-12-31 LAB — BASIC METABOLIC PANEL
Anion gap: 9 (ref 5–15)
BUN: 10 mg/dL (ref 8–23)
CO2: 25 mmol/L (ref 22–32)
Calcium: 8.9 mg/dL (ref 8.9–10.3)
Chloride: 106 mmol/L (ref 98–111)
Creatinine, Ser: 1.03 mg/dL — ABNORMAL HIGH (ref 0.44–1.00)
GFR, Estimated: 56 mL/min — ABNORMAL LOW (ref >60)
Glucose, Bld: 124 mg/dL — ABNORMAL HIGH (ref 70–99)
Potassium: 3.5 mmol/L (ref 3.5–5.1)
Sodium: 140 mmol/L (ref 135–145)

## 2022-12-31 MED ORDER — FUROSEMIDE 20 MG PO TABS: 20.0000 mg | ORAL_TABLET | Freq: Every day | ORAL | 0 refills | Status: DC

## 2022-12-31 MED ORDER — FUROSEMIDE 10 MG/ML IJ SOLN
40.0000 mg | Freq: Once | INTRAMUSCULAR | Status: AC
  Administered 2022-12-31: 40 mg via INTRAVENOUS
  Filled 2022-12-31: qty 4

## 2022-12-31 MED ORDER — TRAMADOL HCL 50 MG PO TABS: 50.0000 mg | ORAL_TABLET | Freq: Four times a day (QID) | ORAL | 0 refills | Status: DC

## 2022-12-31 MED ORDER — METOPROLOL TARTRATE 25 MG PO TABS
12.5000 mg | ORAL_TABLET | Freq: Two times a day (BID) | ORAL | Status: DC
  Administered 2022-12-31: 12.5 mg via ORAL
  Filled 2022-12-31: qty 1

## 2022-12-31 NOTE — Discharge Summary (Addendum)
Physician Discharge Summary   Patient: [name] MRN: [id number] DOB: 1946/02/20  Admit date:     12/28/2022  Discharge date: 12/31/22  Discharge Physician: Deatra James   PCP: Sharilyn Sites, MD   Recommendations at discharge:   Follow-up with urology in 1-2 weeks Follow with PCP in 2-4 weeks Continue oral hydration  Discharge Diagnoses: Principal Problem:   Flank pain Active Problems:   Neuropathy   GERD (gastroesophageal reflux disease)   Controlled type 2 diabetes mellitus (HCC)   Anxiety   Kidney stones   Ureteral colic  Resolved Problems:   * No resolved hospital problems. *  Hospital Course: [name] is a 77 y.o. female with medical history significant of anxiety, GERD, heart murmur, neuropathy, type 2 diabetes mellitus, history of DVT/PE on Xarelto, and more presents the ED with a chief complaint of left flank pain.  The pain started at 11 AM on the same day as presentation.  Patient reports associated nausea but no vomiting.  She describes the pain is feeling like there is a heaviness or rocking her side.  She denies any change in hematuria, but reports that she has continued hematuria since the lithotripsy.  Patient denies any fever.  She reports she did not try any pain medication specifically for this before she came into the hospital.  She did take a hydrocodone that morning so that she could "prevent pain."  Patient reports she has been struggling with flank pain since October.  She has been treating for many UTI leading up to this procedure.  Patient reports that she has had 2 loose stools in 1 day.  She has no other complaints.   ED: Temp Max 98.1, HR  91-108, RR 15-26, blood pressure 157/77-194/103 Satting 91-98% Leukocytosis at 12.4, hemoglobin 12.7, platelets 297 Chemistries unremarkable UA is suspicious for UTI, a procalcitonin negative CT renal study shows moderate left hydroureteronephrosis with 3 stones in the distal ureter  Urology  consulted and was admission to [facility] with Dr. Alyson Ingles this morning who seen and evaluate the patient, recommending continue fluid hydration no intervention    * Flank pain -improved  -Hemodynamically stable, afebrile, normotensive - Recent lithotripsy done December 29/2023 - CT renal study shows moderate left hydroureteronephrosis with 3 stones/stone fragments within the distal left ureter - UA suspicious for  UTI - Procalcitonin less than 0.10 - Urine culture-multiple species present  - Urology consulted Dr. Alyson Ingles: Has seen evaluate patient recommending no further intervention, continue hydration and diuresing  Anxiety - Continue reduced dose of Valium  Controlled type 2 diabetes mellitus ([place]) -Diabetic diet-carb modified -Diet resuming home regimen  GERD (gastroesophageal reflux disease) - Continue PPI  Neuropathy - Continue gabapentin    Pain control - [place] Controlled Substance Reporting System database was reviewed. and patient was instructed, not to drive, operate heavy machinery, perform activities at heights, swimming or participation in water activities or provide baby-sitting services while on Pain, Sleep and Anxiety Medications; until their outpatient Physician has advised to do so again. Also recommended to not to take more than prescribed Pain, Sleep and Anxiety Medications.  Consultants: Urologist  Disposition: Home Diet recommendation:  Discharge Diet Orders (From admission, onward)     Start     Ordered   12/31/22 0000  Diet - low sodium heart healthy        12/31/22 0947           Cardiac diet DISCHARGE MEDICATION: Allergies as of 12/31/2022  Reactions   Nsaids Other (See Comments)   Caused Ulcers.   Cephalexin    unknown   Darvocet [propoxyphene N-acetaminophen] Nausea Only   Percocet [oxycodone-acetaminophen] Nausea Only   Penicillins Rash, Other (See Comments)   Has patient had a PCN reaction causing  immediate rash, facial/tongue/throat swelling, SOB or lightheadedness with hypotension: Yes Has patient had a PCN reaction causing severe rash involving mucus membranes or skin necrosis: No Has patient had a PCN reaction that required hospitalization No Has patient had a PCN reaction occurring within the last 10 years: Yes If all of the above answers are "NO", then may proceed with Cephalosporin use.        Medication List     STOP taking these medications    amitriptyline 10 MG tablet Commonly known as: ELAVIL       TAKE these medications    acetaminophen 500 MG tablet Commonly known as: TYLENOL Take 500 mg by mouth every 6 (six) hours as needed for moderate pain or headache.   cyanocobalamin 1000 MCG tablet Take 1,000 mcg by mouth daily.   diazepam 5 MG tablet Commonly known as: VALIUM Take 5 mg by mouth every 8 (eight) hours as needed for anxiety.   dicyclomine 20 MG tablet Commonly known as: BENTYL Take 20 mg by mouth 4 (four) times daily as needed for spasms.   ferrous sulfate 325 (65 FE) MG tablet Take 1 tablet (325 mg total) by mouth daily with breakfast.   furosemide 20 MG tablet Commonly known as: Lasix Take 1 tablet (20 mg total) by mouth daily for 5 days.   gabapentin 300 MG capsule Commonly known as: NEURONTIN Take 300 mg by mouth at bedtime.   HYDROcodone-acetaminophen 5-325 MG tablet Commonly known as: NORCO/VICODIN Take 1 tablet by mouth every 8 (eight) hours as needed for severe pain.   metFORMIN 500 MG tablet Commonly known as: GLUCOPHAGE Take 500 mg by mouth 2 (two) times daily with a meal.   montelukast 10 MG tablet Commonly known as: SINGULAIR Take 10 mg by mouth every morning.   omeprazole 20 MG capsule Commonly known as: PRILOSEC TAKE 1 CAPSULE(20 MG) BY MOUTH TWICE DAILY BEFORE A MEAL   PEPPERMINT OIL PO Take 1 capsule by mouth daily. Intestinal Defense With Ginger and Fennel   POTASSIUM PO Take 1 tablet by mouth daily.    PROBIOTIC DAILY PO Take 1 capsule by mouth daily.   rivaroxaban 20 MG Tabs tablet Commonly known as: XARELTO Take 20 mg by mouth daily with supper.   traMADol 50 MG tablet Commonly known as: ULTRAM Take 1 tablet (50 mg total) by mouth 4 (four) times daily.   Turmeric 450 MG Caps Take 450 mg by mouth daily.   Vitamin D 50 MCG (2000 UT) Caps Take 2,000 Units by mouth daily.        Discharge Exam: Filed Weights   12/28/22 1452 12/29/22 1735  Weight: 84.8 kg 88.2 kg      Physical Exam:   General:  AAO x 3,  cooperative, no distress;   HEENT:  Normocephalic, PERRL, otherwise with in Normal limits   Neuro:  CNII-XII intact. , normal motor and sensation, reflexes intact   Lungs:   Clear to auscultation BL, Respirations unlabored,  No wheezes / crackles  Cardio:    S1/S2, RRR, No murmure, No Rubs or Gallops   Abdomen:  Soft, non-tender, bowel sounds active all four quadrants, no guarding or peritoneal signs.  Muscular  skeletal:  Mild  L CVA tenderness- Limited exam -global generalized weaknesses - in bed, able to move all 4 extremities,   2+ pulses,  symmetric, No pitting edema  Skin:  Dry, warm to touch, negative for any Rashes,  Wounds: Please see nursing documentation          Condition at discharge: good  The results of significant diagnostics from this hospitalization (including imaging, microbiology, ancillary and laboratory) are listed below for reference.   Imaging Studies: CT ABDOMEN PELVIS W CONTRAST  Result Date: 12/28/2022 CLINICAL DATA:  Abdominal pain, post-op Left flank pain, onset today.  Cystoscopy on Friday. EXAM: CT ABDOMEN AND PELVIS WITH CONTRAST TECHNIQUE: Multidetector CT imaging of the abdomen and pelvis was performed using the standard protocol following bolus administration of intravenous contrast. RADIATION DOSE REDUCTION: This exam was performed according to the departmental dose-optimization program which includes automated exposure  control, adjustment of the mA and/or kV according to patient size and/or use of iterative reconstruction technique. CONTRAST:  14m OMNIPAQUE IOHEXOL 300 MG/ML  SOLN COMPARISON:  Noncontrast CT 12/14/2022 FINDINGS: Lower chest: Large hiatal hernia with the majority of the stomach being intrathoracic. Stable gastric configuration. Compressive atelectasis in the left lower lobe related to hiatal hernia. Linear atelectasis in the right lung base. No pleural fluid. Hepatobiliary: Diffuse hepatic steatosis. Occasional tiny hepatic cysts, needing no further follow-up. Clips in the gallbladder fossa postcholecystectomy. No biliary dilatation. Pancreas: No ductal dilatation or inflammation. Spleen: Normal in size without focal abnormality. Adrenals/Urinary Tract: Normal adrenal glands. There is moderate left hydroureteronephrosis. Three stones or stone fragment within the distal left ureter, largest measuring 5-6 mm distally. There is mild left ureteral enhancement as well as thickening distally. Moderate left perinephric edema with diminished left renal enhancement and absent excretion on delayed phase imaging. Left intrarenal calculi including a 19 mm stone in the lower pole. Extrarenal pelvis configuration of the right kidney. No right urolithiasis. Cyst arising from the upper pole of the right kidney with a thin peripheral wall calcification. No further follow-up is recommended. Minimal wall thickening involving the base of the left aspect of the urinary bladder. Air in the nondependent bladder is likely related to recent instrumentation. No perivesicular fluid. Stomach/Bowel: Large hiatal hernia with the majority of the stomach being intrathoracic. Stable gastric configuration. The hiatal hernia also contains a moderate length segment of transverse colon. The enteric hernia is not included in the field of view. No small or large bowel obstruction or inflammatory change. High-riding cecum in the right mid abdomen.  Appendectomy per history. Vascular/Lymphatic: Mild aortic and branch atherosclerosis. No aortic aneurysm. Patent portal and splenic veins. No adenopathy. Reproductive: Uterus and bilateral adnexa are unremarkable. Other: Trace free fluid in the pelvis. Left retroperitoneal stranding. No free air. No focal fluid collection. Small fat containing umbilical hernia. Musculoskeletal: Stable osseous structures. Minimal L2 superior endplate compression deformity is chronic (there are 4 non-rib-bearing lumbar vertebra). IMPRESSION: 1. Moderate left hydroureteronephrosis with three stones or stone fragment within the distal left ureter, largest measuring 5-6 mm distally. There is mild left ureteral enhancement as well as thickening distally. Moderate left perinephric edema with diminished left renal enhancement and absent excretion on delayed phase imaging. Recommend correlation with urinalysis to assess for infection. 2. Left intrarenal calculi including a 19 mm stone in the lower pole. 3. Minimal wall thickening involving the base of the left aspect of the urinary bladder, likely reactive. Air in the nondependent bladder is likely related to recent instrumentation. 4. Large hiatal hernia with the  majority of the stomach being intrathoracic. The hiatal hernia also contains a moderate length segment of transverse colon. 5. Hepatic steatosis. Electronically Signed   By: Keith Rake M.D.   On: 12/28/2022 18:01   US RENAL  Result Date: 12/23/2022 CLINICAL DATA:  Nephrolithiasis EXAM: RENAL / URINARY TRACT ULTRASOUND COMPLETE COMPARISON:  None Available. FINDINGS: Right Kidney: Renal measurements: 9.2 x 4.1 x 4.1 cm = volume: 80 mL. Echogenicity within normal limits. No mass or hydronephrosis visualized. Left Kidney: Renal measurements: 10.8 x 5.3 x 5.2 cm = volume: 155 mL. Renal cortical echogenicity is normal and cortical thickness has been preserved. Echogenic structure within the lower pole the left kidney does not  clearly demonstrate shadowing and may simply represent prominent renal sinus fat. There is mild left hydronephrosis. No intrarenal masses or calculi are clearly identified. Bladder: Appears normal for degree of bladder distention. Other: None. IMPRESSION: 1. Mild left hydronephrosis. 2. No nephrolithiasis identified. Electronically Signed   By: Fidela Salisbury M.D.   On: 12/23/2022 16:30   DG C-Arm 1-60 Min-No Report  Result Date: 12/23/2022 Fluoroscopy was utilized by the requesting physician.  No radiographic interpretation.   DG C-Arm 1-60 Min-No Report  Result Date: 12/23/2022 Fluoroscopy was utilized by the requesting physician.  No radiographic interpretation.   CT RENAL STONE STUDY  Result Date: 12/16/2022 CLINICAL DATA:  Left flank pain for 3 months. Hematuria. Nephrolithiasis. EXAM: CT ABDOMEN AND PELVIS WITHOUT CONTRAST TECHNIQUE: Multidetector CT imaging of the abdomen and pelvis was performed following the standard protocol without IV contrast. RADIATION DOSE REDUCTION: This exam was performed according to the departmental dose-optimization program which includes automated exposure control, adjustment of the mA and/or kV according to patient size and/or use of iterative reconstruction technique. COMPARISON:  03/05/2021 FINDINGS: Lower chest: No acute findings. Hepatobiliary: Moderate diffuse hepatic steatosis is again demonstrated. Prior cholecystectomy. No evidence of biliary obstruction. Pancreas: No mass or inflammatory process visualized on this unenhanced exam. Spleen:  Within normal limits in size. Adrenals/Urinary tract: 1.7 cm calculus is seen in the left renal pelvis causing mild left hydronephrosis. Unremarkable unopacified urinary bladder. Stomach/Bowel: Large hiatal hernia is again seen containing the entire stomach. No evidence of obstruction, inflammatory process, or abnormal fluid collections. Vascular/Lymphatic: No pathologically enlarged lymph nodes identified. No evidence  of abdominal aortic aneurysm. Reproductive:  No mass or other significant abnormality. Other:  None. Musculoskeletal:  No suspicious bone lesions identified. IMPRESSION: 1.7 cm calculus in left renal pelvis causing mild left hydronephrosis. Large hiatal hernia, which contains the entire stomach. Moderate hepatic steatosis. Electronically Signed   By: Marlaine Hind M.D.   On: 12/16/2022 08:02    Microbiology: Results for orders placed or performed during the hospital encounter of 12/28/22  Urine Culture     Status: Abnormal   Collection Time: 12/28/22  6:50 PM   Specimen: Urine, Clean Catch  Result Value Ref Range Status   Specimen Description   Final    URINE, CLEAN CATCH Performed at [facility], [address] [place], [place] [address]    Special Requests   Final    NONE Performed at [facility], [address], [place], [place] [address]    Culture MULTIPLE SPECIES PRESENT, SUGGEST RECOLLECTION (A)  Final   Report Status 12/30/2022 FINAL  Final    Labs: CBC: Recent Labs  Lab 12/28/22 1541 12/29/22 0423 12/30/22 0334 12/31/22 0637  WBC 12.4* 8.0 5.6 13.0*  NEUTROABS 9.7* 6.0  --   --   HGB 12.7  10.5* 10.3* 11.0*  HCT 39.7 32.6* 32.4* 34.0*  MCV 93.0 91.3 91.5 90.9  PLT 297 242 248 182   Basic Metabolic Panel: Recent Labs  Lab 12/28/22 1541 12/29/22 0423 12/30/22 0334 12/31/22 0637  NA 139 135 139 140  K 4.3 3.9 3.4* 3.5  CL 106 102 105 106  CO2 '25 25 27 25  '$ GLUCOSE 110* 129* 108* 124*  BUN '18 17 16 10  '$ CREATININE 1.13* 1.09* 1.07* 1.03*  CALCIUM 9.5 8.6* 8.8* 8.9  MG  --  1.7  --   --    Liver Function Tests: Recent Labs  Lab 12/28/22 1541 12/29/22 0423  AST 18 17  ALT 22 19  ALKPHOS 61 48  BILITOT 0.5 0.6  PROT 6.9 5.6*  ALBUMIN 3.9 3.1*   CBG: Recent Labs  Lab 12/30/22 0713 12/30/22 1129 12/30/22 1629 12/30/22 2149 12/31/22 0808  GLUCAP 102* 156* 136* 112* 120*    Discharge time spent: greater than 30 minutes.  Signed: Deatra James, MD [facility] 12/31/2022

## 2023-01-05 DIAGNOSIS — E6609 Other obesity due to excess calories: Secondary | ICD-10-CM | POA: Diagnosis not present

## 2023-01-05 DIAGNOSIS — N202 Calculus of kidney with calculus of ureter: Secondary | ICD-10-CM | POA: Diagnosis not present

## 2023-01-05 DIAGNOSIS — Z6834 Body mass index (BMI) 34.0-34.9, adult: Secondary | ICD-10-CM | POA: Diagnosis not present

## 2023-01-23 ENCOUNTER — Ambulatory Visit (INDEPENDENT_AMBULATORY_CARE_PROVIDER_SITE_OTHER): Payer: PPO | Admitting: Urology

## 2023-01-23 ENCOUNTER — Encounter: Payer: Self-pay | Admitting: Urology

## 2023-01-23 VITALS — BP 170/83 | HR 96 | Ht 63.0 in | Wt 194.4 lb

## 2023-01-23 DIAGNOSIS — N200 Calculus of kidney: Secondary | ICD-10-CM

## 2023-01-23 DIAGNOSIS — N1330 Unspecified hydronephrosis: Secondary | ICD-10-CM | POA: Diagnosis not present

## 2023-01-23 DIAGNOSIS — E6609 Other obesity due to excess calories: Secondary | ICD-10-CM | POA: Diagnosis not present

## 2023-01-23 DIAGNOSIS — N132 Hydronephrosis with renal and ureteral calculous obstruction: Secondary | ICD-10-CM

## 2023-01-23 DIAGNOSIS — R300 Dysuria: Secondary | ICD-10-CM

## 2023-01-23 DIAGNOSIS — F419 Anxiety disorder, unspecified: Secondary | ICD-10-CM | POA: Diagnosis not present

## 2023-01-23 DIAGNOSIS — Z6834 Body mass index (BMI) 34.0-34.9, adult: Secondary | ICD-10-CM | POA: Diagnosis not present

## 2023-01-23 LAB — URINALYSIS, ROUTINE W REFLEX MICROSCOPIC
Bilirubin, UA: NEGATIVE
Glucose, UA: NEGATIVE
Ketones, UA: NEGATIVE
Nitrite, UA: POSITIVE — AB
Protein,UA: NEGATIVE
Specific Gravity, UA: 1.02 (ref 1.005–1.030)
Urobilinogen, Ur: 0.2 mg/dL (ref 0.2–1.0)
pH, UA: 5.5 (ref 5.0–7.5)

## 2023-01-23 LAB — MICROSCOPIC EXAMINATION: WBC, UA: >30 /hpf — AB (ref 0–5)

## 2023-01-23 MED ORDER — SULFAMETHOXAZOLE-TRIMETHOPRIM 800-160 MG PO TABS: 1.0000 | ORAL_TABLET | Freq: Two times a day (BID) | ORAL | 0 refills | Status: DC

## 2023-01-23 NOTE — Progress Notes (Unsigned)
01/23/2023 1:18 PM   [name] [birth year] [id number]  Referring provider: Sharilyn Sites, MD [address] [place],  [place] [address]  No chief complaint on file.   HPI:   New patient-   1) left renal stone-CT Mar 2022 showed a 15 mm left renal pelvic/midpole stone (visible on the scout, SSD 9 cm, HU 1300).  [place] November 2023 revealed a stable 14 mm left renal pelvic stone.  She also has a 5.8 cm right upper pole cyst. Dec 2023 KUB measured left stone at 2.2 cm/. Dec 2023 CT more accurately showed it to be 17 mm left renal stone.    Her urine culture October 2023 was negative.  Her creatinine was 0.99 in November 2023.   2) gross hematuria - she had red urine in Nov 2023. [place], CT 2023 benign. Cysto benign 2023.    She saw Dr. Jeffie Pollock. She passed two stones yrs ago but never any surgery for stone.   She is seen for the above. She underwent Dec 2023 left URS/HLL/stent. The stent was pulled out when she transferred to the bed in the OR. She did well from that pt of view. She was admitted for uncontrolled left flank pain Jan 2024 and 12/28/2022 CT showed left hydro, fragments in the left lower pole and a few fragments in the left distal ureter up to 5-6 mm. She was managed conservatively and did well. Today, she is better. No flank pain. She passed many small fragments since discharge and showed them to me. She has some mild dysuria. UA with many bacteria, 3-10 rbc.    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 Dr. Hilma Favors. She stopped it for c-scope.      She worked for [employer] and [employer]. She volunteers as Solicitor.    PMH: Past Medical History:  Diagnosis Date   Anxiety 12/29/2022   Arthritis    Asthma    Carpal tunnel syndrome    Gastric ulcer    GERD (gastroesophageal reflux disease)    H/O hiatal hernia    Heart murmur    since birth   History of heart murmur in childhood    History of kidney stones    Iron deficiency anemia    Neuropathy     Following neck surgery.   Paralysis (HCC)    PONV (postoperative nausea and vomiting)    Pulmonary embolus [facility])    November 2020   Type 2 diabetes mellitus [facility])     Surgical History: Past Surgical History:  Procedure Laterality Date   APPENDECTOMY     BIOPSY  06/01/2022   Procedure: BIOPSY;  Surgeon: Rogene [place], MD;  [facility];  Service: Endoscopy;;   CHOLECYSTECTOMY     COLONOSCOPY     COLONOSCOPY N/A 09/26/2013   Procedure: COLONOSCOPY;  Surgeon: Rogene [place], MD;  [facility];  Service: Endoscopy;  Laterality: N/A;  1030-rescheduled to 12:00pm Ann notified pt   COLONOSCOPY N/A 03/27/2015   Procedure: COLONOSCOPY;  Surgeon: Rogene [place], MD;  [facility];  Service: Endoscopy;  Laterality: N/A;  11:15 - moved to 8:25 - Ann to notify pt   COLONOSCOPY WITH PROPOFOL N/A 06/01/2022   Procedure: COLONOSCOPY WITH PROPOFOL;  Surgeon: Rogene [place], MD;  [facility];  Service: Endoscopy;  Laterality: N/A;  1005 ASA 2   CYSTOSCOPY/URETEROSCOPY/HOLMIUM LASER/STENT PLACEMENT Left 12/23/2022   Procedure: CYSTOSCOPY/ LEFT RETROGRADE/ URETEROSCOPY/HOLMIUM LASER/STENT PLACEMENT;  Surgeon: Festus Aloe, MD;  Location: [facility];  Service: Urology;  Laterality: Left;   ESOPHAGOGASTRODUODENOSCOPY  12/08/2011   Procedure: ESOPHAGOGASTRODUODENOSCOPY (EGD);  Surgeon: Rogene [place], MD;  [facility];  Service: Endoscopy;  Laterality: N/A;  3:00    ESOPHAGOGASTRODUODENOSCOPY N/A 03/27/2015   Procedure: ESOPHAGOGASTRODUODENOSCOPY (EGD);  Surgeon: Rogene [place], MD;  [facility];  Service: Endoscopy;  Laterality: N/A;   EYE SURGERY Bilateral    cataract surgery   GIVENS CAPSULE STUDY N/A 11/04/2015   Procedure: GIVENS CAPSULE STUDY;  Surgeon: Rogene [place], MD;  [facility];  Service: Endoscopy;  Laterality: N/A;   Hysterscopy     KNEE ARTHROSCOPY     Left   NECK SURGERY     x 2    TONSILLECTOMY      TOTAL KNEE ARTHROPLASTY  07/11/2012   Procedure: TOTAL KNEE ARTHROPLASTY;  Surgeon: Ninetta Lights, MD;  Location: [facility];  Service: Orthopedics;  Laterality: Left;  left total knee arthroplasty   UPPER GASTROINTESTINAL ENDOSCOPY     YAG LASER APPLICATION Right 2/95/2841   Procedure: YAG LASER APPLICATION;  Surgeon: Rutherford Guys, MD;  Location: [facility];  Service: Ophthalmology;  Laterality: Right;    Home Medications:  Allergies as of 01/23/2023       Reactions   Nsaids Other (See Comments)   Caused Ulcers.   Cephalexin    unknown   Darvocet [propoxyphene N-acetaminophen] Nausea Only   Percocet [oxycodone-acetaminophen] Nausea Only   Penicillins Rash, Other (See Comments)   Has patient had a PCN reaction causing immediate rash, facial/tongue/throat swelling, SOB or lightheadedness with hypotension: Yes Has patient had a PCN reaction causing severe rash involving mucus membranes or skin necrosis: No Has patient had a PCN reaction that required hospitalization No Has patient had a PCN reaction occurring within the last 10 years: Yes If all of the above answers are "NO", then may proceed with Cephalosporin use.        Medication List        Accurate as of January 23, 2023  1:18 PM. If you have any questions, ask your nurse or doctor.          acetaminophen 500 MG tablet Commonly known as: TYLENOL Take 500 mg by mouth every 6 (six) hours as needed for moderate pain or headache.   cyanocobalamin 1000 MCG tablet Take 1,000 mcg by mouth daily.   diazepam 5 MG tablet Commonly known as: VALIUM Take 5 mg by mouth every 8 (eight) hours as needed for anxiety.   dicyclomine 20 MG tablet Commonly known as: BENTYL Take 20 mg by mouth 4 (four) times daily as needed for spasms.   ferrous sulfate 325 (65 FE) MG tablet Take 1 tablet (325 mg total) by mouth daily with breakfast.   furosemide 20 MG tablet Commonly known as: Lasix Take 1 tablet (20 mg total) by mouth daily for 5  days.   gabapentin 300 MG capsule Commonly known as: NEURONTIN Take 300 mg by mouth at bedtime.   HYDROcodone-acetaminophen 5-325 MG tablet Commonly known as: NORCO/VICODIN Take 1 tablet by mouth every 8 (eight) hours as needed for severe pain.   metFORMIN 500 MG tablet Commonly known as: GLUCOPHAGE Take 500 mg by mouth 2 (two) times daily with a meal.   montelukast 10 MG tablet Commonly known as: SINGULAIR Take 10 mg by mouth every morning.   omeprazole 20 MG capsule Commonly known as: PRILOSEC TAKE 1 CAPSULE(20 MG)  BY MOUTH TWICE DAILY BEFORE A MEAL   PEPPERMINT OIL PO Take 1 capsule by mouth daily. Intestinal Defense With Ginger and Fennel   POTASSIUM PO Take 1 tablet by mouth daily.   PROBIOTIC DAILY PO Take 1 capsule by mouth daily.   rivaroxaban 20 MG Tabs tablet Commonly known as: XARELTO Take 20 mg by mouth daily with supper.   traMADol 50 MG tablet Commonly known as: ULTRAM Take 1 tablet (50 mg total) by mouth 4 (four) times daily.   Turmeric 450 MG Caps Take 450 mg by mouth daily.   Vitamin D 50 MCG (2000 UT) Caps Take 2,000 Units by mouth daily.        Allergies:  Allergies  Allergen Reactions   Nsaids Other (See Comments)    Caused Ulcers.   Cephalexin     unknown   Darvocet [Propoxyphene N-Acetaminophen] Nausea Only   Percocet [Oxycodone-Acetaminophen] Nausea Only   Penicillins Rash and Other (See Comments)    Has patient had a PCN reaction causing immediate rash, facial/tongue/throat swelling, SOB or lightheadedness with hypotension: Yes Has patient had a PCN reaction causing severe rash involving mucus membranes or skin necrosis: No Has patient had a PCN reaction that required hospitalization No Has patient had a PCN reaction occurring within the last 10 years: Yes If all of the above answers are "NO", then may proceed with Cephalosporin use.     Family History: Family History  Problem Relation Age of Onset   Hypertension Son     Alzheimer's disease Mother    Kidney disease Mother    Kidney disease Father    Cancer Maternal Aunt        leukemia   Liver disease Maternal Uncle    Kidney disease Paternal Uncle        kidney removed   Diabetes Maternal Grandmother    Diabetes Maternal Grandfather    Cancer Paternal Grandmother        breast, liver   Colon cancer Neg Hx     Social History:  reports that she has never smoked. She has never been exposed to tobacco smoke. She has never used smokeless tobacco. She reports that she does not drink alcohol and does not use drugs.   Physical Exam: BP (!) 170/83   Pulse 96   Ht '5\' 3"'$  (1.6 m)   Wt 194 lb 6.4 oz (88.2 kg)   BMI 34.44 kg/m   Constitutional:  Alert and oriented, No acute distress. HEENT: [place] AT, moist mucus membranes.  Trachea midline, no masses. Cardiovascular: No clubbing, cyanosis, or edema. Respiratory: Normal respiratory effort, no increased work of breathing. GI: Abdomen is soft, nontender, nondistended, no abdominal masses GU: No CVA tenderness Lymph: No cervical or inguinal lymphadenopathy. Skin: No rashes, bruises or suspicious lesions. Neurologic: Grossly intact, no focal deficits, moving all 4 extremities. Psychiatric: Normal mood and affect.  Laboratory Data: Lab Results  Component Value Date   WBC 13.0 (H) 12/31/2022   HGB 11.0 (L) 12/31/2022   HCT 34.0 (L) 12/31/2022   MCV 90.9 12/31/2022   PLT 251 12/31/2022    Lab Results  Component Value Date   CREATININE 1.03 (H) 12/31/2022    No results found for: "PSA"  No results found for: "TESTOSTERONE"  Lab Results  Component Value Date   HGBA1C 6.1 (H) 11/02/2017    Urinalysis    Component Value Date/Time   COLORURINE STRAW (A) 12/28/2022 [place] 12/28/2022 1850   LABSPEC 1.018 12/28/2022  [place] 7.0 12/28/2022 1850   GLUCOSEU NEGATIVE 12/28/2022 1850   HGBUR LARGE (A) 12/28/2022 [place] 12/28/2022 1850   BILIRUBINUR  negative 10/15/2022 0836   KETONESUR 5 (A) 12/28/2022 1850   PROTEINUR 30 (A) 12/28/2022 1850   UROBILINOGEN 0.2 10/15/2022 0836   UROBILINOGEN 0.2 05/21/2013 2133   NITRITE NEGATIVE 12/28/2022 1850   LEUKOCYTESUR SMALL (A) 12/28/2022 1850    Lab Results  Component Value Date   BACTERIA RARE (A) 12/28/2022    Pertinent Imaging:  Results for orders placed during the hospital encounter of 11/28/22  DG Abd 1 View  Narrative CLINICAL DATA:  Left-sided kidney stone.  EXAM: ABDOMEN - 1 VIEW  COMPARISON:  03/23/2022, 11/22/2022.  FINDINGS: The bowel gas pattern is normal. A 2.2 cm calculus present over the left renal shadow. No ureteral calculus is identified. Cholecystectomy clips are present in the right upper quadrant. A few scattered phleboliths are noted in the pelvis.  IMPRESSION: 2.2 cm left renal calculus.   Electronically Signed By: Brett Fairy M.D. On: 11/29/2022 20:48  Results for orders placed during the hospital encounter of 11/01/17  US Venous Img Lower Bilateral  Narrative CLINICAL DATA:  77 year old female with bilateral lower extremity edema  EXAM: BILATERAL LOWER EXTREMITY VENOUS DOPPLER ULTRASOUND  TECHNIQUE: Gray-scale sonography with graded compression, as well as color Doppler and duplex ultrasound were performed to evaluate the lower extremity deep venous systems from the level of the common femoral vein and including the common femoral, femoral, profunda femoral, popliteal and calf veins including the posterior tibial, peroneal and gastrocnemius veins when visible. The superficial great saphenous vein was also interrogated. Spectral Doppler was utilized to evaluate flow at rest and with distal augmentation maneuvers in the common femoral, femoral and popliteal veins.  COMPARISON:  None.  FINDINGS: RIGHT LOWER EXTREMITY  Common Femoral Vein: No evidence of thrombus. Normal compressibility, respiratory phasicity and response to  augmentation.  Saphenofemoral Junction: No evidence of thrombus. Normal compressibility and flow on color Doppler imaging.  Profunda Femoral Vein: No evidence of thrombus. Normal compressibility and flow on color Doppler imaging.  Femoral Vein: No evidence of thrombus. Normal compressibility, respiratory phasicity and response to augmentation.  Popliteal Vein: No evidence of thrombus. Normal compressibility, respiratory phasicity and response to augmentation.  Calf Veins: No evidence of thrombus. Normal compressibility and flow on color Doppler imaging.  Superficial Great Saphenous Vein: No evidence of thrombus. Normal compressibility.  Venous Reflux:  None.  Other Findings:  None.  LEFT LOWER EXTREMITY  Common Femoral Vein: No evidence of thrombus. Normal compressibility, respiratory phasicity and response to augmentation.  Saphenofemoral Junction: No evidence of thrombus. Normal compressibility and flow on color Doppler imaging.  Profunda Femoral Vein: No evidence of thrombus. Normal compressibility and flow on color Doppler imaging.  Femoral Vein: No evidence of thrombus. Normal compressibility, respiratory phasicity and response to augmentation.  Popliteal Vein: No evidence of thrombus. Normal compressibility, respiratory phasicity and response to augmentation.  Calf Veins: No evidence of thrombus. Normal compressibility and flow on color Doppler imaging.  Superficial Great Saphenous Vein: No evidence of thrombus. Normal compressibility.  Venous Reflux:  None.  Other Findings:  None.  IMPRESSION: No evidence of deep venous thrombosis.   Electronically Signed By: Jacqulynn Cadet M.D. On: 11/02/2017 13:20  No results found for this or any previous visit.  No results found for this or any previous visit.  Results for orders placed during the hospital encounter of 12/23/22  US RENAL  Narrative CLINICAL DATA:  Nephrolithiasis  EXAM: RENAL /  URINARY TRACT ULTRASOUND COMPLETE  COMPARISON:  None Available.  FINDINGS: Right Kidney:  Renal measurements: 9.2 x 4.1 x 4.1 cm = volume: 80 mL. Echogenicity within normal limits. No mass or hydronephrosis visualized.  Left Kidney:  Renal measurements: 10.8 x 5.3 x 5.2 cm = volume: 155 mL. Renal cortical echogenicity is normal and cortical thickness has been preserved. Echogenic structure within the lower pole the left kidney does not clearly demonstrate shadowing and may simply represent prominent renal sinus fat. There is mild left hydronephrosis. No intrarenal masses or calculi are clearly identified.  Bladder:  Appears normal for degree of bladder distention.  Other:  None.  IMPRESSION: 1. Mild left hydronephrosis. 2. No nephrolithiasis identified.   Electronically Signed By: Fidela Salisbury M.D. On: 12/23/2022 16:30  No valid procedures specified. No results found for this or any previous visit.  Results for orders placed in visit on 12/01/22  CT RENAL STONE STUDY  Narrative CLINICAL DATA:  Left flank pain for 3 months. Hematuria. Nephrolithiasis.  EXAM: CT ABDOMEN AND PELVIS WITHOUT CONTRAST  TECHNIQUE: Multidetector CT imaging of the abdomen and pelvis was performed following the standard protocol without IV contrast.  RADIATION DOSE REDUCTION: This exam was performed according to the departmental dose-optimization program which includes automated exposure control, adjustment of the mA and/or kV according to patient size and/or use of iterative reconstruction technique.  COMPARISON:  03/05/2021  FINDINGS: Lower chest: No acute findings.  Hepatobiliary: Moderate diffuse hepatic steatosis is again demonstrated. Prior cholecystectomy. No evidence of biliary obstruction.  Pancreas: No mass or inflammatory process visualized on this unenhanced exam.  Spleen:  Within normal limits in size.  Adrenals/Urinary tract: 1.7 cm calculus is seen in  the left renal pelvis causing mild left hydronephrosis. Unremarkable unopacified urinary bladder.  Stomach/Bowel: Large hiatal hernia is again seen containing the entire stomach. No evidence of obstruction, inflammatory process, or abnormal fluid collections.  Vascular/Lymphatic: No pathologically enlarged lymph nodes identified. No evidence of abdominal aortic aneurysm.  Reproductive:  No mass or other significant abnormality.  Other:  None.  Musculoskeletal:  No suspicious bone lesions identified.  IMPRESSION: 1.7 cm calculus in left renal pelvis causing mild left hydronephrosis.  Large hiatal hernia, which contains the entire stomach.  Moderate hepatic steatosis.   Electronically Signed By: Marlaine Hind M.D. On: 12/16/2022 08:02   Assessment & Plan:    1. Kidney stones Check a KUB  - Urinalysis, Routine w reflex microscopic  2. Hydronephrosis - check renal US to ensure resolution. If remains discussed cysto, left RGP/URS and stent.   3. Dysuria - urine for culture. Rx for cephalexin.   No follow-ups on file.  Festus Aloe, MD  [facility]  [address] [place], [place] [address] [phone]

## 2023-01-25 LAB — URINE CULTURE

## 2023-01-31 LAB — CALCULI, WITH PHOTOGRAPH (CLINICAL LAB)
Calcium Oxalate Dihydrate: 10 %
Calcium Oxalate Monohydrate: 90 %
Weight Calculi: 49 mg

## 2023-02-02 ENCOUNTER — Telehealth: Payer: Self-pay

## 2023-02-02 ENCOUNTER — Ambulatory Visit: Payer: HMO | Admitting: Urology

## 2023-02-02 DIAGNOSIS — R399 Unspecified symptoms and signs involving the genitourinary system: Secondary | ICD-10-CM

## 2023-02-02 MED ORDER — NITROFURANTOIN MONOHYD MACRO 100 MG PO CAPS: 100.0000 mg | ORAL_CAPSULE | Freq: Two times a day (BID) | ORAL | 0 refills | Status: DC

## 2023-02-02 NOTE — Telephone Encounter (Signed)
Patient came in today to do a urine drop off. Patient voiced that she has bladder infection.

## 2023-02-02 NOTE — Telephone Encounter (Addendum)
UA results reviewed with Dr. Wrenn.  Per Dr. Wrenn patient will start Macrobid 100mg po bid x 7days. Patient called and made aware.  Urine sent for culture.   

## 2023-02-03 LAB — URINALYSIS, ROUTINE W REFLEX MICROSCOPIC
Bilirubin, UA: NEGATIVE
Glucose, UA: NEGATIVE
Ketones, UA: NEGATIVE
Nitrite, UA: NEGATIVE
Specific Gravity, UA: 1.02 (ref 1.005–1.030)
Urobilinogen, Ur: 0.2 mg/dL (ref 0.2–1.0)
pH, UA: 5.5 (ref 5.0–7.5)

## 2023-02-03 LAB — MICROSCOPIC EXAMINATION
RBC, Urine: >30 /hpf — AB (ref 0–2)
WBC, UA: >30 /hpf — AB (ref 0–5)

## 2023-02-06 LAB — URINE CULTURE

## 2023-02-07 ENCOUNTER — Other Ambulatory Visit (HOSPITAL_COMMUNITY): Payer: Self-pay | Admitting: Family Medicine

## 2023-02-07 ENCOUNTER — Ambulatory Visit (HOSPITAL_COMMUNITY)
Admission: RE | Admit: 2023-02-07 | Discharge: 2023-02-07 | Disposition: A | Discharge status: Home / Self Care | Payer: HMO | Source: Ambulatory Visit | Attending: Urology | Admitting: Urology

## 2023-02-07 DIAGNOSIS — N132 Hydronephrosis with renal and ureteral calculous obstruction: Secondary | ICD-10-CM

## 2023-02-07 DIAGNOSIS — N200 Calculus of kidney: Secondary | ICD-10-CM

## 2023-02-07 DIAGNOSIS — Z9889 Other specified postprocedural states: Secondary | ICD-10-CM | POA: Diagnosis not present

## 2023-02-07 DIAGNOSIS — Z9049 Acquired absence of other specified parts of digestive tract: Secondary | ICD-10-CM | POA: Diagnosis not present

## 2023-02-07 DIAGNOSIS — K760 Fatty (change of) liver, not elsewhere classified: Secondary | ICD-10-CM | POA: Diagnosis not present

## 2023-02-07 DIAGNOSIS — Z1231 Encounter for screening mammogram for malignant neoplasm of breast: Secondary | ICD-10-CM

## 2023-02-07 DIAGNOSIS — Z87442 Personal history of urinary calculi: Secondary | ICD-10-CM | POA: Diagnosis not present

## 2023-02-07 DIAGNOSIS — N390 Urinary tract infection, site not specified: Secondary | ICD-10-CM | POA: Diagnosis not present

## 2023-02-07 NOTE — Progress Notes (Unsigned)
UA results reviewed with Dr. Jeffie Pollock.  Per Dr. Jeffie Pollock patient will start Macrobid 199m po bid x 7days. Patient called and made aware.  Urine sent for culture.

## 2023-02-11 DIAGNOSIS — N3001 Acute cystitis with hematuria: Secondary | ICD-10-CM | POA: Diagnosis not present

## 2023-02-11 DIAGNOSIS — Z6834 Body mass index (BMI) 34.0-34.9, adult: Secondary | ICD-10-CM | POA: Diagnosis not present

## 2023-02-11 DIAGNOSIS — R030 Elevated blood-pressure reading, without diagnosis of hypertension: Secondary | ICD-10-CM | POA: Diagnosis not present

## 2023-02-11 DIAGNOSIS — E669 Obesity, unspecified: Secondary | ICD-10-CM | POA: Diagnosis not present

## 2023-02-13 ENCOUNTER — Telehealth: Payer: Self-pay

## 2023-02-13 NOTE — Telephone Encounter (Signed)
Patient states she finished her antibiotic rx'd on 02/08 and had to go back to the urgent care for another UTI on Saturday 02/17.  They started her on Macrobid and she will finish on 02/24.  She is scheduled to follow up with you on 03/18 and you do not have any opening's before then.  She is asking if you have any recommendations before her f/u?

## 2023-02-16 NOTE — Telephone Encounter (Signed)
Patient went to urgent care for gross hematuria ua and culture done at that time.  She is taking abx currently and waiting for culture results.  Patient aware of MD response she denies any urinary symptoms at this time.  She will f/u as scheduled.

## 2023-02-19 ENCOUNTER — Other Ambulatory Visit (INDEPENDENT_AMBULATORY_CARE_PROVIDER_SITE_OTHER): Payer: Self-pay | Admitting: Internal Medicine

## 2023-02-22 ENCOUNTER — Ambulatory Visit (HOSPITAL_COMMUNITY)
Admission: RE | Admit: 2023-02-22 | Discharge: 2023-02-22 | Disposition: A | Discharge status: Home / Self Care | Payer: HMO | Source: Ambulatory Visit | Attending: Family Medicine | Admitting: Family Medicine

## 2023-02-22 DIAGNOSIS — Z1231 Encounter for screening mammogram for malignant neoplasm of breast: Secondary | ICD-10-CM | POA: Diagnosis not present

## 2023-02-23 ENCOUNTER — Ambulatory Visit (INDEPENDENT_AMBULATORY_CARE_PROVIDER_SITE_OTHER): Payer: Medicare Other | Admitting: Gastroenterology

## 2023-03-04 DIAGNOSIS — N390 Urinary tract infection, site not specified: Secondary | ICD-10-CM | POA: Diagnosis not present

## 2023-03-08 DIAGNOSIS — Z6835 Body mass index (BMI) 35.0-35.9, adult: Secondary | ICD-10-CM | POA: Diagnosis not present

## 2023-03-08 DIAGNOSIS — R000 Tachycardia, unspecified: Secondary | ICD-10-CM | POA: Diagnosis not present

## 2023-03-08 DIAGNOSIS — I10 Essential (primary) hypertension: Secondary | ICD-10-CM | POA: Diagnosis not present

## 2023-03-08 DIAGNOSIS — R030 Elevated blood-pressure reading, without diagnosis of hypertension: Secondary | ICD-10-CM | POA: Diagnosis not present

## 2023-03-08 DIAGNOSIS — Z0001 Encounter for general adult medical examination with abnormal findings: Secondary | ICD-10-CM | POA: Diagnosis not present

## 2023-03-08 DIAGNOSIS — Z1331 Encounter for screening for depression: Secondary | ICD-10-CM | POA: Diagnosis not present

## 2023-03-08 DIAGNOSIS — E6609 Other obesity due to excess calories: Secondary | ICD-10-CM | POA: Diagnosis not present

## 2023-03-13 ENCOUNTER — Ambulatory Visit (INDEPENDENT_AMBULATORY_CARE_PROVIDER_SITE_OTHER): Payer: HMO | Admitting: Urology

## 2023-03-13 ENCOUNTER — Emergency Department (HOSPITAL_COMMUNITY)
Admission: EM | Admit: 2023-03-13 | Discharge: 2023-03-13 | Disposition: A | Discharge status: Home / Self Care | Payer: HMO | Attending: Emergency Medicine | Admitting: Emergency Medicine

## 2023-03-13 ENCOUNTER — Emergency Department (HOSPITAL_COMMUNITY): Payer: HMO

## 2023-03-13 ENCOUNTER — Encounter (HOSPITAL_COMMUNITY): Payer: Self-pay

## 2023-03-13 ENCOUNTER — Other Ambulatory Visit: Payer: Self-pay

## 2023-03-13 VITALS — BP 136/76 | HR 93

## 2023-03-13 DIAGNOSIS — R0602 Shortness of breath: Secondary | ICD-10-CM | POA: Diagnosis not present

## 2023-03-13 DIAGNOSIS — N281 Cyst of kidney, acquired: Secondary | ICD-10-CM | POA: Diagnosis not present

## 2023-03-13 DIAGNOSIS — Z7901 Long term (current) use of anticoagulants: Secondary | ICD-10-CM | POA: Insufficient documentation

## 2023-03-13 DIAGNOSIS — E119 Type 2 diabetes mellitus without complications: Secondary | ICD-10-CM | POA: Insufficient documentation

## 2023-03-13 DIAGNOSIS — N3000 Acute cystitis without hematuria: Secondary | ICD-10-CM

## 2023-03-13 DIAGNOSIS — J45909 Unspecified asthma, uncomplicated: Secondary | ICD-10-CM | POA: Diagnosis not present

## 2023-03-13 DIAGNOSIS — N200 Calculus of kidney: Secondary | ICD-10-CM

## 2023-03-13 DIAGNOSIS — R0789 Other chest pain: Secondary | ICD-10-CM | POA: Diagnosis present

## 2023-03-13 DIAGNOSIS — N132 Hydronephrosis with renal and ureteral calculous obstruction: Secondary | ICD-10-CM | POA: Diagnosis not present

## 2023-03-13 DIAGNOSIS — K449 Diaphragmatic hernia without obstruction or gangrene: Secondary | ICD-10-CM

## 2023-03-13 DIAGNOSIS — Z86711 Personal history of pulmonary embolism: Secondary | ICD-10-CM | POA: Insufficient documentation

## 2023-03-13 DIAGNOSIS — J9811 Atelectasis: Secondary | ICD-10-CM | POA: Diagnosis not present

## 2023-03-13 LAB — URINALYSIS, ROUTINE W REFLEX MICROSCOPIC
Bilirubin Urine: NEGATIVE
Bilirubin, UA: NEGATIVE
Glucose, UA: NEGATIVE
Glucose, UA: NEGATIVE mg/dL
Hgb urine dipstick: NEGATIVE
Ketones, UA: NEGATIVE
Ketones, ur: NEGATIVE mg/dL
Leukocytes,UA: NEGATIVE
Leukocytes,Ua: NEGATIVE
Nitrite, UA: NEGATIVE
Nitrite: NEGATIVE
Protein, ur: NEGATIVE mg/dL
Protein,UA: NEGATIVE
RBC, UA: NEGATIVE
Specific Gravity, UA: 1.015 (ref 1.005–1.030)
Specific Gravity, Urine: 1.016 (ref 1.005–1.030)
Urobilinogen, Ur: 0.2 mg/dL (ref 0.2–1.0)
pH, UA: 6 (ref 5.0–7.5)
pH: 5 (ref 5.0–8.0)

## 2023-03-13 LAB — BASIC METABOLIC PANEL
Anion gap: 9 (ref 5–15)
BUN: 26 mg/dL — ABNORMAL HIGH (ref 8–23)
CO2: 21 mmol/L — ABNORMAL LOW (ref 22–32)
Calcium: 9.3 mg/dL (ref 8.9–10.3)
Chloride: 105 mmol/L (ref 98–111)
Creatinine, Ser: 1.46 mg/dL — ABNORMAL HIGH (ref 0.44–1.00)
GFR, Estimated: 37 mL/min — ABNORMAL LOW (ref >60)
Glucose, Bld: 117 mg/dL — ABNORMAL HIGH (ref 70–99)
Potassium: 4.6 mmol/L (ref 3.5–5.1)
Sodium: 135 mmol/L (ref 135–145)

## 2023-03-13 LAB — CBC
HCT: 38.1 % (ref 36.0–46.0)
Hemoglobin: 12.1 g/dL (ref 12.0–15.0)
MCH: 29 pg (ref 26.0–34.0)
MCHC: 31.8 g/dL (ref 30.0–36.0)
MCV: 91.4 fL (ref 80.0–100.0)
Platelets: 254 10*3/uL (ref 150–400)
RBC: 4.17 MIL/uL (ref 3.87–5.11)
RDW: 15.2 % (ref 11.5–15.5)
WBC: 6.8 10*3/uL (ref 4.0–10.5)
nRBC: 0 % (ref 0.0–0.2)

## 2023-03-13 MED ORDER — PANTOPRAZOLE SODIUM 40 MG PO TBEC: 40.0000 mg | DELAYED_RELEASE_TABLET | Freq: Every day | ORAL | 0 refills | Status: DC

## 2023-03-13 MED ORDER — SODIUM CHLORIDE 0.9 % IV BOLUS
1000.0000 mL | Freq: Once | INTRAVENOUS | Status: AC
  Administered 2023-03-13: 1000 mL via INTRAVENOUS

## 2023-03-13 NOTE — ED Triage Notes (Signed)
Pt reports increased tremors and pain around left shoulder/axilla/ribcage for at least a month and now her breathing feels "heavy" for the past few days.

## 2023-03-13 NOTE — Discharge Instructions (Addendum)
Stop the omeprazole and start the pantoprazole as directed.  Please follow-up with your GI provider for recheck.  Return to the emergency department for any new or worsening symptoms.

## 2023-03-13 NOTE — Progress Notes (Unsigned)
03/13/2023 9:15 AM   [name] [name] 26-Feb-1946 [id number]  Referring provider: Sharilyn Sites, MD [address] [place],  [place] [address]  No chief complaint on file.   HPI:   F/u -    1) left renal stone-CT Mar 2022 showed a 15 mm left renal pelvic/midpole stone (visible on the scout, SSD 9 cm, HU 1300).  [place] November 2023 revealed a stable 14 mm left renal pelvic stone.  She also has a 5.8 cm right upper pole cyst. Dec 2023 KUB measured left stone at 2.2 cm/. Dec 2023 CT more accurately showed it to be 17 mm left renal stone.   She underwent Dec 2023 left URS/HLL/stent. The stent was pulled out when she transferred to the bed in the OR. She did well initially, but was admitted for uncontrolled left flank pain Jan 2024 and a CT showed left hydro, fragments in the left lower pole and a few fragments in the left distal ureter up to 5-6 mm. She eventually passed the frgments.    2) gross hematuria - she had red urine in Nov 2023. [place], CT 2023 benign. Cysto benign 2023.    She saw Dr. Jeffie Pollock. She passed two stones yrs ago but never any surgery for stone.    She is seen for the above.  She underwent a follow-up ultrasound February 2024 which showed no hydronephrosis and a 6 mm lower pole echogenic focus. She had jets bilaterally. February 2024 KUB was negative. SHe has had three episodes of dysuria and UTI. Responds to abx. No dysuria today. She is on bactrim now.    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 Dr. Hilma Favors. She stopped it for c-scope.     She worked for [employer]. She volunteers as Solicitor.     PMH: Past Medical History:  Diagnosis Date   Anxiety 12/29/2022   Arthritis    Asthma    Carpal tunnel syndrome    Gastric ulcer    GERD (gastroesophageal reflux disease)    H/O hiatal hernia    Heart murmur    since birth   History of heart murmur in childhood    History of kidney stones    Iron deficiency anemia    Neuropathy     Following neck surgery.   Paralysis (HCC)    PONV (postoperative nausea and vomiting)    Pulmonary embolus [facility])    November 2020   Type 2 diabetes mellitus [facility])     Surgical History: Past Surgical History:  Procedure Laterality Date   APPENDECTOMY     BIOPSY  06/01/2022   Procedure: BIOPSY;  Surgeon: Rogene Houston, MD;  [facility];  Service: Endoscopy;;   CHOLECYSTECTOMY     COLONOSCOPY     COLONOSCOPY N/A 09/26/2013   Procedure: COLONOSCOPY;  Surgeon: Rogene Houston, MD;  [facility];  Service: Endoscopy;  Laterality: N/A;  1030-rescheduled to 12:00pm Ann notified pt   COLONOSCOPY N/A 03/27/2015   Procedure: COLONOSCOPY;  Surgeon: Rogene Houston, MD;  [facility];  Service: Endoscopy;  Laterality: N/A;  11:15 - moved to 8:25 - Ann to notify pt   COLONOSCOPY WITH PROPOFOL N/A 06/01/2022   Procedure: COLONOSCOPY WITH PROPOFOL;  Surgeon: Rogene Houston, MD;  [facility];  Service: Endoscopy;  Laterality: N/A;  1005 ASA 2   CYSTOSCOPY/URETEROSCOPY/HOLMIUM LASER/STENT PLACEMENT Left 12/23/2022   Procedure: CYSTOSCOPY/ LEFT RETROGRADE/ URETEROSCOPY/HOLMIUM LASER/STENT PLACEMENT;  Surgeon: Festus Aloe, MD;  Location: [facility];  Service: Urology;  Laterality: Left;   ESOPHAGOGASTRODUODENOSCOPY  12/08/2011   Procedure: ESOPHAGOGASTRODUODENOSCOPY (EGD);  Surgeon: Rogene Houston, MD;  [facility];  Service: Endoscopy;  Laterality: N/A;  3:00    ESOPHAGOGASTRODUODENOSCOPY N/A 03/27/2015   Procedure: ESOPHAGOGASTRODUODENOSCOPY (EGD);  Surgeon: Rogene Houston, MD;  [facility];  Service: Endoscopy;  Laterality: N/A;   EYE SURGERY Bilateral    cataract surgery   GIVENS CAPSULE STUDY N/A 11/04/2015   Procedure: GIVENS CAPSULE STUDY;  Surgeon: Rogene Houston, MD;  [facility];  Service: Endoscopy;  Laterality: N/A;   Hysterscopy     KNEE ARTHROSCOPY     Left   NECK SURGERY     x 2    TONSILLECTOMY      TOTAL KNEE ARTHROPLASTY  07/11/2012   Procedure: TOTAL KNEE ARTHROPLASTY;  Surgeon: Ninetta Lights, MD;  Location: [facility];  Service: Orthopedics;  Laterality: Left;  left total knee arthroplasty   UPPER GASTROINTESTINAL ENDOSCOPY     YAG LASER APPLICATION Right [id number]   Procedure: YAG LASER APPLICATION;  Surgeon: Rutherford Guys, MD;  Location: [facility];  Service: Ophthalmology;  Laterality: Right;    Home Medications:  Allergies as of 03/13/2023       Reactions   Nsaids Other (See Comments)   Caused Ulcers.   Cephalexin    unknown   Darvocet [propoxyphene N-acetaminophen] Nausea Only   Percocet [oxycodone-acetaminophen] Nausea Only   Penicillins Rash, Other (See Comments)   Has patient had a PCN reaction causing immediate rash, facial/tongue/throat swelling, SOB or lightheadedness with hypotension: Yes Has patient had a PCN reaction causing severe rash involving mucus membranes or skin necrosis: No Has patient had a PCN reaction that required hospitalization No Has patient had a PCN reaction occurring within the last 10 years: Yes If all of the above answers are "NO", then may proceed with Cephalosporin use.        Medication List        Accurate as of March 13, 2023  9:15 AM. If you have any questions, ask your nurse or doctor.          acetaminophen 500 MG tablet Commonly known as: TYLENOL Take 500 mg by mouth every 6 (six) hours as needed for moderate pain or headache.   cyanocobalamin 1000 MCG tablet Take 1,000 mcg by mouth daily.   diazepam 5 MG tablet Commonly known as: VALIUM Take 5 mg by mouth every 8 (eight) hours as needed for anxiety.   dicyclomine 20 MG tablet Commonly known as: BENTYL Take 20 mg by mouth 4 (four) times daily as needed for spasms.   Dilt-XR 180 MG 24 hr capsule Generic drug: diltiazem Take 180 mg by mouth at bedtime.   ferrous sulfate 325 (65 FE) MG tablet Take 1 tablet (325 mg total) by mouth daily with breakfast.   furosemide 20  MG tablet Commonly known as: Lasix Take 1 tablet (20 mg total) by mouth daily for 5 days.   gabapentin 300 MG capsule Commonly known as: NEURONTIN Take 300 mg by mouth at bedtime.   HYDROcodone-acetaminophen 5-325 MG tablet Commonly known as: NORCO/VICODIN Take 1 tablet by mouth every 8 (eight) hours as needed for severe pain.   metFORMIN 500 MG tablet Commonly known as: GLUCOPHAGE Take 500 mg by mouth 2 (two) times daily with a meal.   montelukast 10 MG tablet Commonly known as: SINGULAIR Take 10 mg  by mouth every morning.   nitrofurantoin (macrocrystal-monohydrate) 100 MG capsule Commonly known as: MACROBID Take 1 capsule (100 mg total) by mouth 2 (two) times daily.   olmesartan 20 MG tablet Commonly known as: BENICAR Take 20 mg by mouth daily.   omeprazole 20 MG capsule Commonly known as: PRILOSEC TAKE 1 CAPSULE(20 MG) BY MOUTH TWICE DAILY BEFORE A MEAL   PEPPERMINT OIL PO Take 1 capsule by mouth daily. Intestinal Defense With Ginger and Fennel   POTASSIUM PO Take 1 tablet by mouth daily.   PROBIOTIC DAILY PO Take 1 capsule by mouth daily.   rivaroxaban 20 MG Tabs tablet Commonly known as: XARELTO Take 20 mg by mouth daily with supper.   sulfamethoxazole-trimethoprim 800-160 MG tablet Commonly known as: BACTRIM DS Take 1 tablet by mouth 2 (two) times daily.   traMADol 50 MG tablet Commonly known as: ULTRAM Take 1 tablet (50 mg total) by mouth 4 (four) times daily.   Turmeric 450 MG Caps Take 450 mg by mouth daily.   Vitamin D 50 MCG (2000 UT) Caps Take 2,000 Units by mouth daily.        Allergies:  Allergies  Allergen Reactions   Nsaids Other (See Comments)    Caused Ulcers.   Cephalexin     unknown   Darvocet [Propoxyphene N-Acetaminophen] Nausea Only   Percocet [Oxycodone-Acetaminophen] Nausea Only   Penicillins Rash and Other (See Comments)    Has patient had a PCN reaction causing immediate rash, facial/tongue/throat swelling, SOB or  lightheadedness with hypotension: Yes Has patient had a PCN reaction causing severe rash involving mucus membranes or skin necrosis: No Has patient had a PCN reaction that required hospitalization No Has patient had a PCN reaction occurring within the last 10 years: Yes If all of the above answers are "NO", then may proceed with Cephalosporin use.     Family History: Family History  Problem Relation Age of Onset   Hypertension Son    Alzheimer's disease Mother    Kidney disease Mother    Kidney disease Father    Cancer Maternal Aunt        leukemia   Liver disease Maternal Uncle    Kidney disease Paternal Uncle        kidney removed   Diabetes Maternal Grandmother    Diabetes Maternal Grandfather    Cancer Paternal Grandmother        breast, liver   Colon cancer Neg Hx     Social History:  reports that she has never smoked. She has never been exposed to tobacco smoke. She has never used smokeless tobacco. She reports that she does not drink alcohol and does not use drugs.   Physical Exam: BP 136/76   Pulse 93   Constitutional:  Alert and oriented, No acute distress. HEENT: [place] AT, moist mucus membranes.  Trachea midline, no masses. Cardiovascular: No clubbing, cyanosis, or edema. Respiratory: Normal respiratory effort, no increased work of breathing. GI: Abdomen is soft, nontender, nondistended, no abdominal masses GU: No CVA tenderness Skin: No rashes, bruises or suspicious lesions. Neurologic: Grossly intact, no focal deficits, moving all 4 extremities. Psychiatric: Normal mood and affect.  Laboratory Data: Lab Results  Component Value Date   WBC 13.0 (H) 12/31/2022   HGB 11.0 (L) 12/31/2022   HCT 34.0 (L) 12/31/2022   MCV 90.9 12/31/2022   PLT 251 12/31/2022    Lab Results  Component Value Date   CREATININE 1.03 (H) 12/31/2022    No results found  for: "PSA"  No results found for: "TESTOSTERONE"  Lab Results  Component Value Date   HGBA1C 6.1 (H)  11/02/2017    Urinalysis    Component Value Date/Time   COLORURINE STRAW (A) 12/28/2022 1850   APPEARANCEUR Cloudy (A) 02/02/2023 1622   LABSPEC 1.018 12/28/2022 1850   PHURINE 7.0 12/28/2022 1850   GLUCOSEU Negative 02/02/2023 1622   HGBUR LARGE (A) 12/28/2022 1850   BILIRUBINUR Negative 02/02/2023 1622   KETONESUR 5 (A) 12/28/2022 1850   PROTEINUR 1+ (A) 02/02/2023 1622   PROTEINUR 30 (A) 12/28/2022 1850   UROBILINOGEN 0.2 10/15/2022 0836   UROBILINOGEN 0.2 05/21/2013 2133   NITRITE Negative 02/02/2023 1622   NITRITE NEGATIVE 12/28/2022 1850   LEUKOCYTESUR 2+ (A) 02/02/2023 1622   LEUKOCYTESUR SMALL (A) 12/28/2022 1850    Lab Results  Component Value Date   LABMICR See below: 02/02/2023   WBCUA >30 (A) 02/02/2023   LABEPIT 0-10 02/02/2023   BACTERIA Many (A) 02/02/2023    Pertinent Imaging:  Results for orders placed during the hospital encounter of 02/07/23  DG Abd 1 View  Narrative CLINICAL DATA:  History of nephrolithiasis. Status post lithotripsy. Recent UTI.  EXAM: ABDOMEN - 1 VIEW  COMPARISON:  CT abdomen pelvis with contrast 12/28/2022  FINDINGS: No dilated loops of bowel to indicate ileus or obstruction. Cholecystectomy clips are seen in the right upper quadrant. There are no radiopaque renal calculi. Pelvic calcifications consistent with phleboliths.  IMPRESSION: No radiopaque renal calculi.   Electronically Signed By: Miachel Roux M.D. On: 02/08/2023 09:11    Results for orders placed during the hospital encounter of 02/07/23  US RENAL  Narrative CLINICAL DATA:  Nephrolithiasis follow-up  EXAM: RENAL / URINARY TRACT ULTRASOUND COMPLETE  COMPARISON:  CT abdomen pelvis 12/28/2022  FINDINGS: Right Kidney:  Renal measurements: 10.8 cm = volume: 113 mL. Echogenicity within normal limits. No mass or hydronephrosis visualized. 5.0 x 4.5 x 4.7 cm simple cyst seen in the upper pole.  Left Kidney:  Renal measurements: 11.2 cm =  volume: 33 mL. Echogenicity within normal limits. No mass or hydronephrosis visualized. 6 mm nonobstructing calculus seen in the lower pole of the left kidney.  Bladder:  Appears normal for degree of bladder distention.  Other:  Diffuse increased echogenicity of the visualized portions of the hepatic parenchyma are a nonspecific indicator of hepatocellular dysfunction, most commonly steatosis.  IMPRESSION: 1. 6 mm nonobstructing calculus in the lower pole of the left kidney. 2. No hydronephrosis.   Electronically Signed By: Miachel Roux M.D. On: 02/08/2023 09:09  No valid procedures specified. No results found for this or any previous visit.  Results for orders placed in visit on 12/01/22  CT RENAL STONE STUDY  Narrative CLINICAL DATA:  Left flank pain for 3 months. Hematuria. Nephrolithiasis.  EXAM: CT ABDOMEN AND PELVIS WITHOUT CONTRAST  TECHNIQUE: Multidetector CT imaging of the abdomen and pelvis was performed following the standard protocol without IV contrast.  RADIATION DOSE REDUCTION: This exam was performed according to the departmental dose-optimization program which includes automated exposure control, adjustment of the mA and/or kV according to patient size and/or use of iterative reconstruction technique.  COMPARISON:  03/05/2021  FINDINGS: Lower chest: No acute findings.  Hepatobiliary: Moderate diffuse hepatic steatosis is again demonstrated. Prior cholecystectomy. No evidence of biliary obstruction.  Pancreas: No mass or inflammatory process visualized on this unenhanced exam.  Spleen:  Within normal limits in size.  Adrenals/Urinary tract: 1.7 cm calculus is seen in the left  renal pelvis causing mild left hydronephrosis. Unremarkable unopacified urinary bladder.  Stomach/Bowel: Large hiatal hernia is again seen containing the entire stomach. No evidence of obstruction, inflammatory process, or abnormal fluid  collections.  Vascular/Lymphatic: No pathologically enlarged lymph nodes identified. No evidence of abdominal aortic aneurysm.  Reproductive:  No mass or other significant abnormality.  Other:  None.  Musculoskeletal:  No suspicious bone lesions identified.  IMPRESSION: 1.7 cm calculus in left renal pelvis causing mild left hydronephrosis.  Large hiatal hernia, which contains the entire stomach.  Moderate hepatic steatosis.   Electronically Signed By: Marlaine Hind M.D. On: 12/16/2022 08:02   Assessment & Plan:    1.  Kidney stone, renal cyst-current imaging with possible left lower pole stone but this was not seen on KUB.  Will see her back in 1 year with a renal ultrasound. - Urinalysis, Routine w reflex microscopic  2. UTI  - she is on probiotics. Start d-mannose. Wean off bactrim - I wrote a schedule.    No follow-ups on file.  Festus Aloe, MD  [facility]  [address] [place], [place] [address] [phone]

## 2023-03-13 NOTE — ED Provider Notes (Signed)
[facility] Provider Note   CSN: [id number] Arrival date & time: 03/13/23  1522     History  Chief Complaint  Patient presents with   Shortness of Breath    [name] is a 77 y.o. female.   Shortness of Breath Associated symptoms: chest pain   Associated symptoms: no abdominal pain, no cough, no fever, no headaches, no rash and no vomiting        [name] is a 77 y.o. female past medical history of asthma, GERD, gastric ulcer, type 2 diabetes, iron deficient anemia, prior pulmonary embolus currently anticoagulated on Xarelto, and kidney stones.  She presents to the Emergency Department complaining of feeling tremulous and having pain all around her left lateral ribs and scapular area.  She has been having this pain for over 1 month and noticed after being admitted into the hospital for surgery for a kidney stone.  She also endorses feeling increased fatigue and feels that her breathing is "heavy" the symptoms have been present for several days.  No fever, cough, chills, or swelling of her feet or legs.  She also describes feeling a "knot" in her throat and middle of her chest that is usually present in the morning upon waking and spontaneously resolves.  States that she has GERD and takes medication, but does not feel this is related to her GERD.   Home Medications Prior to Admission medications   Medication Sig Start Date End Date Taking? Authorizing Provider  acetaminophen (TYLENOL) 500 MG tablet Take 500 mg by mouth every 6 (six) hours as needed for moderate pain or headache.    [provider]  Cholecalciferol (VITAMIN D) 2000 UNITS CAPS Take 2,000 Units by mouth daily.    [provider]  cyanocobalamin 1000 MCG tablet Take 1,000 mcg by mouth daily.    [provider]  diazepam (VALIUM) 5 MG tablet Take 5 mg by mouth every 8 (eight) hours as needed for anxiety.    [provider]   dicyclomine (BENTYL) 20 MG tablet Take 20 mg by mouth 4 (four) times daily as needed for spasms.    [provider]  DILT-XR 180 MG 24 hr capsule Take 180 mg by mouth at bedtime. 03/08/23   [provider]  ferrous sulfate 325 (65 FE) MG tablet Take 1 tablet (325 mg total) by mouth daily with breakfast. 10/25/16   Rehman, Mechele Dawley, MD  furosemide (LASIX) 20 MG tablet Take 1 tablet (20 mg total) by mouth daily for 5 days. 12/31/22 01/05/23  Shahmehdi, Valeria Batman, MD  gabapentin (NEURONTIN) 300 MG capsule Take 300 mg by mouth at bedtime.    [provider]  HYDROcodone-acetaminophen (NORCO/VICODIN) 5-325 MG tablet Take 1 tablet by mouth every 8 (eight) hours as needed for severe pain. 12/12/22   [provider]  metFORMIN (GLUCOPHAGE) 500 MG tablet Take 500 mg by mouth 2 (two) times daily with a meal.     [provider]  montelukast (SINGULAIR) 10 MG tablet Take 10 mg by mouth every morning.    [provider]  nitrofurantoin, macrocrystal-monohydrate, (MACROBID) 100 MG capsule Take 1 capsule (100 mg total) by mouth 2 (two) times daily. 02/02/23   Irine Seal, MD  olmesartan (BENICAR) 20 MG tablet Take 20 mg by mouth daily. 01/23/23   [provider]  omeprazole (PRILOSEC) 20 MG capsule TAKE 1 CAPSULE(20 MG) BY MOUTH TWICE DAILY BEFORE A MEAL 02/20/23  Montez Morita, Quillian Quince, MD  PEPPERMINT OIL PO Take 1 capsule by mouth daily. Intestinal Defense With Ginger and Fennel    [provider]  POTASSIUM PO Take 1 tablet by mouth daily.    [provider]  Probiotic Product (PROBIOTIC DAILY PO) Take 1 capsule by mouth daily.    [provider]  rivaroxaban (XARELTO) 20 MG TABS tablet Take 20 mg by mouth daily with supper.    [provider]  sulfamethoxazole-trimethoprim (BACTRIM DS) 800-160 MG tablet Take 1 tablet by mouth 2 (two) times daily. 01/23/23   Festus Aloe, MD  traMADol (ULTRAM) 50 MG tablet Take 1  tablet (50 mg total) by mouth 4 (four) times daily. 12/31/22   Shahmehdi, Valeria Batman, MD  Turmeric 450 MG CAPS Take 450 mg by mouth daily.    [provider]      Allergies    Nsaids, Cephalexin, Darvocet [propoxyphene n-acetaminophen], Percocet [oxycodone-acetaminophen], and Penicillins    Review of Systems   Review of Systems  Constitutional:  Positive for fatigue. Negative for appetite change, chills and fever.  HENT:  Negative for trouble swallowing.   Respiratory:  Positive for shortness of breath. Negative for cough.   Cardiovascular:  Positive for chest pain.  Gastrointestinal:  Negative for abdominal pain, diarrhea, nausea and vomiting.  Genitourinary:  Negative for difficulty urinating, dysuria and hematuria.  Musculoskeletal:  Positive for back pain (left upper to mid back pain).  Skin:  Negative for rash.  Neurological:  Positive for tremors. Negative for dizziness, weakness and headaches.  Psychiatric/Behavioral:  Negative for confusion.     Physical Exam Updated Vital Signs BP (!) 146/66 (BP Location: Right Arm)   Pulse 77   Temp 97.7 F (36.5 C) (Oral)   Resp 16   Ht 5\' 3"  (1.6 m)   Wt 86.2 kg   SpO2 97%   BMI 33.66 kg/m  Physical Exam Vitals and nursing note reviewed.  Constitutional:      General: She is not in acute distress.    Appearance: She is well-developed. She is not ill-appearing or toxic-appearing.  Cardiovascular:     Rate and Rhythm: Normal rate and regular rhythm.     Pulses: Normal pulses.  Pulmonary:     Effort: Pulmonary effort is normal. No respiratory distress.  Chest:     Chest wall: Tenderness (ttp of the lateral left upper chest wall.  no crepitus or bony deformity) present.  Abdominal:     Palpations: Abdomen is soft.     Tenderness: There is no abdominal tenderness.  Musculoskeletal:        General: Normal range of motion.     Cervical back: Normal range of motion.     Right lower leg: No edema.     Left lower leg: No  edema.  Skin:    General: Skin is warm.     Capillary Refill: Capillary refill takes less than 2 seconds.     Findings: No rash.  Neurological:     General: No focal deficit present.     Mental Status: She is alert.     Sensory: No sensory deficit.     Motor: No weakness.     ED Results / Procedures / Treatments   Labs (all labs ordered are listed, but only abnormal results are displayed) Labs Reviewed  BASIC METABOLIC PANEL - Abnormal; Notable for the following components:      Result Value   CO2 21 (*)    Glucose, Bld  117 (*)    BUN 26 (*)    Creatinine, Ser 1.46 (*)    GFR, Estimated 37 (*)    All other components within normal limits  CBC    EKG EKG Interpretation  Date/Time:  Monday March 13 2023 16:03:13 EDT Ventricular Rate:  74 PR Interval:  224 QRS Duration: 80 QT Interval:  380 QTC Calculation: 421 R Axis:   91 Text Interpretation: Sinus rhythm with 1st degree A-V block Rightward axis Low voltage QRS Borderline ECG When compared with ECG of 31-Dec-2022 10:03, No significant change was found Confirmed by Noemi Chapel [phone]) on 03/13/2023 4:07:48 PM  Radiology DG Chest 2 View  Result Date: 03/13/2023 CLINICAL DATA:  Shortness of breath. Labored breathing. EXAM: CHEST - 2 VIEW COMPARISON:  Radiograph 10/28/2022, lung bases from abdominal CT 12/28/2022 remote CT 07/18/2013 FINDINGS: Large hiatal hernia with air-fluid levels in the intrathoracic stomach. Adjacent atelectasis in the left lower lobe. The heart is normal in size. Streaky atelectasis at the right lung base. No pulmonary edema, pneumothorax or large pleural effusion. Surgical hardware in the lower cervical spine. No acute osseous abnormalities are seen. IMPRESSION: Large hiatal hernia with air-fluid levels in the intrathoracic stomach. Bibasilar atelectasis. Electronically Signed   By: Keith Rake M.D.   On: 03/13/2023 16:28    Procedures Procedures    Medications Ordered in ED Medications - No  data to display  ED Course/ Medical Decision Making/ A&P                             Medical Decision Making Patient here with generalized feelings of increasing fatigue, left lateral chest wall and left mid to upper back pain along with tremors.  Symptoms present for a month, gradually worsening for several days.  Notes recently diagnosed hypertension and started on antihypertensive medications.  Some shortness of breath as well.  History of PE in 2020 and chronically anticoagulated with Xarelto.    Amount and/or Complexity of Data Reviewed Labs: ordered.    Details: Labs w/o leukocytosis, kidney functions slightly elevated, but near baseline. BUN elevated felt to be some degree of dehydration.  IVF's administered   Radiology: ordered.    Details: CXR shows large hiatal hernia and atelectasis ECG/medicine tests: ordered.    Details: Sinus rhythm 1st degree AV block.  No significant change from 01/24 Discussion of management or test interpretation with external provider(s): Sx's felt to be related to her hiatal hernia. Her vitals are reassuring, she is chronically anticoagulated and new PE felt less likely.  Plan to increase her PPI dose and she is agreeable to close GI f/u.    Risk Prescription drug management.           Final Clinical Impression(s) / ED Diagnoses Final diagnoses:  Hiatal hernia    Rx / DC Orders ED Discharge Orders     None         Kem Parkinson, PA-C 03/15/23 0931    Noemi Chapel, MD 03/20/23 [phone]

## 2023-03-20 ENCOUNTER — Ambulatory Visit: Payer: HMO

## 2023-03-20 DIAGNOSIS — E1165 Type 2 diabetes mellitus with hyperglycemia: Secondary | ICD-10-CM | POA: Diagnosis not present

## 2023-03-20 DIAGNOSIS — D509 Iron deficiency anemia, unspecified: Secondary | ICD-10-CM | POA: Diagnosis not present

## 2023-03-20 DIAGNOSIS — J4530 Mild persistent asthma, uncomplicated: Secondary | ICD-10-CM | POA: Diagnosis not present

## 2023-03-20 DIAGNOSIS — Z6835 Body mass index (BMI) 35.0-35.9, adult: Secondary | ICD-10-CM | POA: Diagnosis not present

## 2023-03-20 DIAGNOSIS — I10 Essential (primary) hypertension: Secondary | ICD-10-CM | POA: Diagnosis not present

## 2023-03-20 DIAGNOSIS — E1140 Type 2 diabetes mellitus with diabetic neuropathy, unspecified: Secondary | ICD-10-CM | POA: Diagnosis not present

## 2023-03-20 DIAGNOSIS — N202 Calculus of kidney with calculus of ureter: Secondary | ICD-10-CM | POA: Diagnosis not present

## 2023-03-20 DIAGNOSIS — K589 Irritable bowel syndrome without diarrhea: Secondary | ICD-10-CM | POA: Diagnosis not present

## 2023-03-20 DIAGNOSIS — E6609 Other obesity due to excess calories: Secondary | ICD-10-CM | POA: Diagnosis not present

## 2023-03-26 DIAGNOSIS — I10 Essential (primary) hypertension: Secondary | ICD-10-CM | POA: Diagnosis not present

## 2023-03-26 DIAGNOSIS — E1140 Type 2 diabetes mellitus with diabetic neuropathy, unspecified: Secondary | ICD-10-CM | POA: Diagnosis not present

## 2023-03-26 DIAGNOSIS — E1159 Type 2 diabetes mellitus with other circulatory complications: Secondary | ICD-10-CM | POA: Diagnosis not present

## 2023-03-27 ENCOUNTER — Ambulatory Visit (HOSPITAL_COMMUNITY)
Admission: RE | Admit: 2023-03-27 | Discharge: 2023-03-27 | Disposition: A | Discharge status: Home / Self Care | Payer: HMO | Source: Ambulatory Visit | Attending: Urology | Admitting: Urology

## 2023-03-27 DIAGNOSIS — N200 Calculus of kidney: Secondary | ICD-10-CM | POA: Diagnosis not present

## 2023-03-27 DIAGNOSIS — N281 Cyst of kidney, acquired: Secondary | ICD-10-CM | POA: Diagnosis not present

## 2023-03-29 ENCOUNTER — Telehealth: Payer: Self-pay

## 2023-03-29 DIAGNOSIS — N200 Calculus of kidney: Secondary | ICD-10-CM

## 2023-03-29 DIAGNOSIS — N281 Cyst of kidney, acquired: Secondary | ICD-10-CM

## 2023-03-29 NOTE — Telephone Encounter (Signed)
-----   Message from Festus Aloe, MD sent at 03/28/2023  1:03 PM EDT ----- Let Lavell Luster know her renal US looks good. She is due for f/u March 2025 with a renal US. I'm not sure why this one was done but it looks good.   ----- Message ----- From: Audie Box, CMA Sent: 03/28/2023   8:34 AM EDT To: Festus Aloe, MD  Please review.

## 2023-03-29 NOTE — Telephone Encounter (Signed)
Patient did want you to be aware she is noticing increased selling in her ankles, she wants to know if you have any concerns or recommendations.     Patient aware of MD response to [place] and 1 year f/u scheduled and [place] ordered.

## 2023-03-30 NOTE — Telephone Encounter (Signed)
Patient aware of MD response

## 2023-03-30 NOTE — Addendum Note (Signed)
Addended by: Audie Box on: 03/30/2023 07:30 AM   Modules accepted: Orders

## 2023-04-03 DIAGNOSIS — Z6835 Body mass index (BMI) 35.0-35.9, adult: Secondary | ICD-10-CM | POA: Diagnosis not present

## 2023-04-03 DIAGNOSIS — Z20828 Contact with and (suspected) exposure to other viral communicable diseases: Secondary | ICD-10-CM | POA: Diagnosis not present

## 2023-04-03 DIAGNOSIS — J069 Acute upper respiratory infection, unspecified: Secondary | ICD-10-CM | POA: Diagnosis not present

## 2023-04-03 DIAGNOSIS — J4530 Mild persistent asthma, uncomplicated: Secondary | ICD-10-CM | POA: Diagnosis not present

## 2023-04-03 DIAGNOSIS — E6609 Other obesity due to excess calories: Secondary | ICD-10-CM | POA: Diagnosis not present

## 2023-04-18 ENCOUNTER — Ambulatory Visit: Payer: HMO | Admitting: Physician Assistant

## 2023-05-03 ENCOUNTER — Ambulatory Visit: Payer: HMO | Attending: Physician Assistant | Admitting: Internal Medicine

## 2023-05-03 ENCOUNTER — Other Ambulatory Visit: Payer: Self-pay | Admitting: Internal Medicine

## 2023-05-03 ENCOUNTER — Other Ambulatory Visit: Payer: Self-pay | Admitting: *Deleted

## 2023-05-03 ENCOUNTER — Encounter: Payer: Self-pay | Admitting: Internal Medicine

## 2023-05-03 VITALS — BP 118/70 | HR 79 | Ht 64.0 in | Wt 198.4 lb

## 2023-05-03 DIAGNOSIS — M7989 Other specified soft tissue disorders: Secondary | ICD-10-CM

## 2023-05-03 DIAGNOSIS — R0609 Other forms of dyspnea: Secondary | ICD-10-CM | POA: Insufficient documentation

## 2023-05-03 DIAGNOSIS — Z86711 Personal history of pulmonary embolism: Secondary | ICD-10-CM | POA: Diagnosis not present

## 2023-05-03 MED ORDER — FUROSEMIDE 20 MG PO TABS: 20.0000 mg | ORAL_TABLET | Freq: Every day | ORAL | 3 refills | Status: DC

## 2023-05-03 MED ORDER — POTASSIUM CHLORIDE CRYS ER 20 MEQ PO TBCR: 20.0000 meq | EXTENDED_RELEASE_TABLET | Freq: Every day | ORAL | 3 refills | Status: DC

## 2023-05-03 NOTE — Progress Notes (Signed)
Cardiology Office Note  Date: 05/03/2023   ID: [name], [name] 01/19/1946, MRN [id number]  PCP:  Assunta Found, MD  Cardiologist:  [name], MD Electrophysiologist:  None   Reason for Office Visit: Evaluation of leg swelling at the request of Dr. Phillips Odor   History of Present Illness: [name] is a 77 y.o. female known to have DM 2, asthma was referred to cardiology clinic for evaluation of leg swelling.  Patient had kidney stone surgery in 11/2022 following which she started to have bilateral lower extremity swelling associated with DOE. There is no echocardiogram on file. She does not know if she has symptoms of snoring or apnea at night but she keeps her mouth open while sleeping.  No angina, dizziness, lightheadedness, syncope and palpitations.  Denies smoking cigarettes.   Past Medical History:  Diagnosis Date   Anxiety 12/29/2022   Arthritis    Asthma    Carpal tunnel syndrome    Gastric ulcer    GERD (gastroesophageal reflux disease)    H/O hiatal hernia    Heart murmur    since birth   History of heart murmur in childhood    History of kidney stones    Iron deficiency anemia    Neuropathy    Following neck surgery.   Paralysis (HCC)    PONV (postoperative nausea and vomiting)    Pulmonary embolus [facility])    November 2020   Type 2 diabetes mellitus [facility])     Past Surgical History:  Procedure Laterality Date   APPENDECTOMY     BIOPSY  06/01/2022   Procedure: BIOPSY;  Surgeon: Malissa Hippo, MD;  [facility];  Service: Endoscopy;;   CHOLECYSTECTOMY     COLONOSCOPY     COLONOSCOPY N/A 09/26/2013   Procedure: COLONOSCOPY;  Surgeon: Malissa Hippo, MD;  [facility];  Service: Endoscopy;  Laterality: N/A;  1030-rescheduled to 12:00pm Ann notified pt   COLONOSCOPY N/A 03/27/2015   Procedure: COLONOSCOPY;  Surgeon: Malissa Hippo, MD;  [facility];  Service: Endoscopy;  Laterality: N/A;  11:15 - moved to 8:25 -  Ann to notify pt   COLONOSCOPY WITH PROPOFOL N/A 06/01/2022   Procedure: COLONOSCOPY WITH PROPOFOL;  Surgeon: Malissa Hippo, MD;  [facility];  Service: Endoscopy;  Laterality: N/A;  1005 ASA 2   CYSTOSCOPY/URETEROSCOPY/HOLMIUM LASER/STENT PLACEMENT Left 12/23/2022   Procedure: CYSTOSCOPY/ LEFT RETROGRADE/ URETEROSCOPY/HOLMIUM LASER/STENT PLACEMENT;  Surgeon: Jerilee Field, MD;  Location: [facility];  Service: Urology;  Laterality: Left;   ESOPHAGOGASTRODUODENOSCOPY  12/08/2011   Procedure: ESOPHAGOGASTRODUODENOSCOPY (EGD);  Surgeon: Malissa Hippo, MD;  [facility];  Service: Endoscopy;  Laterality: N/A;  3:00    ESOPHAGOGASTRODUODENOSCOPY N/A 03/27/2015   Procedure: ESOPHAGOGASTRODUODENOSCOPY (EGD);  Surgeon: Malissa Hippo, MD;  [facility];  Service: Endoscopy;  Laterality: N/A;   EYE SURGERY Bilateral    cataract surgery   GIVENS CAPSULE STUDY N/A 11/04/2015   Procedure: GIVENS CAPSULE STUDY;  Surgeon: Malissa Hippo, MD;  [facility];  Service: Endoscopy;  Laterality: N/A;   Hysterscopy     KNEE ARTHROSCOPY     Left   NECK SURGERY     x 2    TONSILLECTOMY     TOTAL KNEE ARTHROPLASTY  07/11/2012   Procedure: TOTAL KNEE ARTHROPLASTY;  Surgeon: Loreta Ave, MD;  Location: [facility] OR;  Service: Orthopedics;  Laterality: Left;  left total knee  arthroplasty   UPPER GASTROINTESTINAL ENDOSCOPY     YAG LASER APPLICATION Right 02/21/2017   Procedure: YAG LASER APPLICATION;  Surgeon: Jethro Bolus, MD;  Location: [facility];  Service: Ophthalmology;  Laterality: Right;    Current Outpatient Medications  Medication Sig Dispense Refill   acetaminophen (TYLENOL) 500 MG tablet Take 500 mg by mouth every 6 (six) hours as needed for moderate pain or headache.     Cholecalciferol (VITAMIN D) 2000 UNITS CAPS Take 2,000 Units by mouth daily.     cyanocobalamin 1000 MCG tablet Take 1,000 mcg by mouth daily.     D-MANNOSE PO Take 1 tablet by mouth daily.      diazepam (VALIUM) 5 MG tablet Take 5 mg by mouth every 8 (eight) hours as needed for anxiety.     DILT-XR 180 MG 24 hr capsule Take 180 mg by mouth at bedtime.     ferrous sulfate 325 (65 FE) MG tablet Take 1 tablet (325 mg total) by mouth daily with breakfast.     furosemide (LASIX) 20 MG tablet Take 1 tablet (20 mg total) by mouth daily. 30 tablet 3   gabapentin (NEURONTIN) 300 MG capsule Take 300 mg by mouth 3 (three) times daily as needed.     metFORMIN (GLUCOPHAGE) 500 MG tablet Take 500 mg by mouth 2 (two) times daily with a meal.      montelukast (SINGULAIR) 10 MG tablet Take 10 mg by mouth every morning.     olmesartan (BENICAR) 20 MG tablet Take 20 mg by mouth daily.     omeprazole (PRILOSEC) 20 MG capsule Take 20 mg by mouth 2 (two) times daily before a meal.     PEPPERMINT OIL PO Take 1 capsule by mouth daily. Intestinal Defense With Ginger and Fennel     potassium chloride SA (KLOR-CON M) 20 MEQ tablet Take 1 tablet (20 mEq total) by mouth daily. 30 tablet 3   Probiotic Product (PROBIOTIC DAILY PO) Take 1 capsule by mouth daily.     rivaroxaban (XARELTO) 20 MG TABS tablet Take 20 mg by mouth daily with supper.     Turmeric 450 MG CAPS Take 450 mg by mouth daily.     No current facility-administered medications for this visit.   Allergies:  Nsaids, Cephalexin, Darvocet [propoxyphene n-acetaminophen], Percocet [oxycodone-acetaminophen], and Penicillins   Social History: The patient  reports that she has never smoked. She has never been exposed to tobacco smoke. She has never used smokeless tobacco. She reports that she does not drink alcohol and does not use drugs.   Family History: The patient's family history includes Alzheimer's disease in her mother; Cancer in her maternal aunt and paternal grandmother; Diabetes in her maternal grandfather and maternal grandmother; Hypertension in her son; Kidney disease in her father, mother, and paternal uncle; Liver disease in her maternal  uncle.   ROS:  Please see the history of present illness. Otherwise, complete review of systems is positive for none  All other systems are reviewed and negative.   Physical Exam: VS:  BP 118/70   Pulse 79   Ht 5\' 4"  (1.626 m)   Wt 198 lb 6.4 oz (90 kg)   SpO2 96%   BMI 34.06 kg/m , BMI Body mass index is 34.06 kg/m.  Wt Readings from Last 3 Encounters:  05/03/23 198 lb 6.4 oz (90 kg)  03/13/23 190 lb (86.2 kg)  01/23/23 194 lb 6.4 oz (88.2 kg)    General: Patient appears comfortable at rest.  HEENT: Conjunctiva and lids normal, oropharynx clear with moist mucosa. Neck: Supple, no elevated JVP or carotid bruits, no thyromegaly. Lungs: Clear to auscultation, nonlabored breathing at rest. Cardiac: Regular rate and rhythm, no S3 or significant systolic murmur, no pericardial rub. Abdomen: Soft, nontender, no hepatomegaly, bowel sounds present, no guarding or rebound. Extremities: No pitting edema, distal pulses 2+. Skin: Warm and dry. Musculoskeletal: No kyphosis. Neuropsychiatric: Alert and oriented x3, affect grossly appropriate.  Recent Labwork: 12/29/2022: ALT 19; AST 17; Magnesium 1.7 03/13/2023: BUN 26; Creatinine, Ser 1.46; Hemoglobin 12.1; Platelets 254; Potassium 4.6; Sodium 135     Component Value Date/Time   CHOL 113 11/03/2017 0515   TRIG 65 10/27/2019 0010   HDL 59 11/03/2017 0515   CHOLHDL 1.9 11/03/2017 0515   VLDL 9 11/03/2017 0515   LDLCALC 45 11/03/2017 0515    Other Studies Reviewed Today:   Assessment and Plan: Patient is a 77 year old F known to have DM 2, asthma was referred to cardiology clinic for evaluation of leg swelling.  # DOE and leg swelling, rule out congestive heart failure: Start p.o. Lasix 20 mg once daily along with potassium supplements 20 mill equivalents once daily. Can take an additional dose of Lasix if she continues to have SOB/LE swelling. Obtain BMP in 5 days.  Obtain 2D echocardiogram. Obtain TSH.  # HTN, controlled: Continue  olmesartan 20 mg once daily # Type 2 diabetes mellitus: Continue metformin 500 mg twice daily, per PCP. # History of PE: Continue rivaroxaban 20 mg with supper daily, per PCP.  I have spent a total of 45 minutes with patient reviewing chart, EKGs, labs and examining patient as well as establishing an assessment and plan that was discussed with the patient.  > 50% of time was spent in direct patient care.    Medication Adjustments/Labs and Tests Ordered: Current medicines are reviewed at length with the patient today.  Concerns regarding medicines are outlined above.   Tests Ordered: Orders Placed This Encounter  Procedures   TSH   Basic metabolic panel   ECHOCARDIOGRAM COMPLETE    Medication Changes: Meds ordered this encounter  Medications   furosemide (LASIX) 20 MG tablet    Sig: Take 1 tablet (20 mg total) by mouth daily.    Dispense:  30 tablet    Refill:  3    05/03/2023 NEW   potassium chloride SA (KLOR-CON M) 20 MEQ tablet    Sig: Take 1 tablet (20 mEq total) by mouth daily.    Dispense:  30 tablet    Refill:  3    05/03/2023 NEW    Disposition:  Follow up  3 months  Signed Magdalyn Arenivas Verne Spurr, MD, 05/03/2023 3:53 PM    [facility] at [facility] [address] [place], [place], [place] [address]

## 2023-05-03 NOTE — Patient Instructions (Addendum)
Medication Instructions:  Your physician has recommended you make the following change in your medication:  Start furosemide 20 mg daily Start potassium 20 meq daily Stop OTC potassium supplement Continue other medications the same  Labwork: BMET & TSH 05/09/2023 @[facility] Non-fasting  Testing/Procedures: Your physician has requested that you have an echocardiogram. Echocardiography is a painless test that uses sound waves to create images of your heart. It provides your doctor with information about the size and shape of your heart and how well your heart's chambers and valves are working. This procedure takes approximately one hour. There are no restrictions for this procedure. Please do NOT wear cologne, perfume, aftershave, or lotions (deodorant is allowed). Please arrive 15 minutes prior to your appointment time.  Follow-Up: Your physician recommends that you schedule a follow-up appointment in: 3 months  Any Other Special Instructions Will Be Listed Below (If Applicable).  If you need a refill on your cardiac medications before your next appointment, please call your pharmacy.

## 2023-05-09 DIAGNOSIS — M7989 Other specified soft tissue disorders: Secondary | ICD-10-CM | POA: Diagnosis not present

## 2023-05-09 DIAGNOSIS — R0609 Other forms of dyspnea: Secondary | ICD-10-CM | POA: Diagnosis not present

## 2023-05-10 LAB — BASIC METABOLIC PANEL
BUN/Creatinine Ratio: 24 (ref 12–28)
BUN: 28 mg/dL — ABNORMAL HIGH (ref 8–27)
CO2: 24 mmol/L (ref 20–29)
Calcium: 10.2 mg/dL (ref 8.7–10.3)
Chloride: 102 mmol/L (ref 96–106)
Creatinine, Ser: 1.19 mg/dL — ABNORMAL HIGH (ref 0.57–1.00)
Glucose: 95 mg/dL (ref 70–99)
Potassium: 5 mmol/L (ref 3.5–5.2)
Sodium: 139 mmol/L (ref 134–144)
eGFR: 47 mL/min/{1.73_m2} — ABNORMAL LOW (ref >59)

## 2023-05-10 LAB — TSH: TSH: 1.02 u[IU]/mL (ref 0.450–4.500)

## 2023-05-18 DIAGNOSIS — L720 Epidermal cyst: Secondary | ICD-10-CM | POA: Diagnosis not present

## 2023-05-18 DIAGNOSIS — I872 Venous insufficiency (chronic) (peripheral): Secondary | ICD-10-CM | POA: Diagnosis not present

## 2023-05-18 DIAGNOSIS — Z85828 Personal history of other malignant neoplasm of skin: Secondary | ICD-10-CM | POA: Diagnosis not present

## 2023-05-18 DIAGNOSIS — L820 Inflamed seborrheic keratosis: Secondary | ICD-10-CM | POA: Diagnosis not present

## 2023-05-18 DIAGNOSIS — Z08 Encounter for follow-up examination after completed treatment for malignant neoplasm: Secondary | ICD-10-CM | POA: Diagnosis not present

## 2023-06-14 ENCOUNTER — Ambulatory Visit (HOSPITAL_COMMUNITY)
Admission: RE | Admit: 2023-06-14 | Discharge: 2023-06-14 | Disposition: A | Discharge status: Home / Self Care | Payer: HMO | Source: Ambulatory Visit | Attending: Internal Medicine | Admitting: Internal Medicine

## 2023-06-14 DIAGNOSIS — R0609 Other forms of dyspnea: Secondary | ICD-10-CM | POA: Diagnosis not present

## 2023-06-14 DIAGNOSIS — M7989 Other specified soft tissue disorders: Secondary | ICD-10-CM | POA: Diagnosis not present

## 2023-06-14 LAB — ECHOCARDIOGRAM COMPLETE
AR max vel: 2.27 cm2
AV Area VTI: 2.28 cm2
AV Area mean vel: 2.28 cm2
AV Mean grad: 6 mmHg
AV Peak grad: 12.8 mmHg
Ao pk vel: 1.79 m/s
Area-P 1/2: 2.9 cm2
MV VTI: 2.43 cm2
S' Lateral: 2 cm

## 2023-06-19 DIAGNOSIS — I4891 Unspecified atrial fibrillation: Secondary | ICD-10-CM | POA: Diagnosis not present

## 2023-06-19 DIAGNOSIS — Z6836 Body mass index (BMI) 36.0-36.9, adult: Secondary | ICD-10-CM | POA: Diagnosis not present

## 2023-06-19 DIAGNOSIS — K589 Irritable bowel syndrome without diarrhea: Secondary | ICD-10-CM | POA: Diagnosis not present

## 2023-06-19 DIAGNOSIS — I10 Essential (primary) hypertension: Secondary | ICD-10-CM | POA: Diagnosis not present

## 2023-06-19 DIAGNOSIS — E782 Mixed hyperlipidemia: Secondary | ICD-10-CM | POA: Diagnosis not present

## 2023-06-19 DIAGNOSIS — R000 Tachycardia, unspecified: Secondary | ICD-10-CM | POA: Diagnosis not present

## 2023-06-19 DIAGNOSIS — E1159 Type 2 diabetes mellitus with other circulatory complications: Secondary | ICD-10-CM | POA: Diagnosis not present

## 2023-06-19 DIAGNOSIS — D509 Iron deficiency anemia, unspecified: Secondary | ICD-10-CM | POA: Diagnosis not present

## 2023-06-19 DIAGNOSIS — F419 Anxiety disorder, unspecified: Secondary | ICD-10-CM | POA: Diagnosis not present

## 2023-06-19 DIAGNOSIS — E6609 Other obesity due to excess calories: Secondary | ICD-10-CM | POA: Diagnosis not present

## 2023-06-19 DIAGNOSIS — J4530 Mild persistent asthma, uncomplicated: Secondary | ICD-10-CM | POA: Diagnosis not present

## 2023-06-21 ENCOUNTER — Telehealth: Payer: Self-pay | Admitting: Internal Medicine

## 2023-06-21 ENCOUNTER — Other Ambulatory Visit: Payer: Self-pay | Admitting: *Deleted

## 2023-06-21 ENCOUNTER — Encounter: Payer: Self-pay | Admitting: Family Medicine

## 2023-06-21 DIAGNOSIS — I4892 Unspecified atrial flutter: Secondary | ICD-10-CM

## 2023-06-21 DIAGNOSIS — E1165 Type 2 diabetes mellitus with hyperglycemia: Secondary | ICD-10-CM | POA: Diagnosis not present

## 2023-06-21 MED ORDER — METOPROLOL TARTRATE 25 MG PO TABS: 12.5000 mg | ORAL_TABLET | Freq: Two times a day (BID) | ORAL | 1 refills | Status: DC

## 2023-06-21 NOTE — Telephone Encounter (Signed)
Patient informed and verbalized understanding of plan. 

## 2023-06-21 NOTE — Telephone Encounter (Signed)
I told patient that [facility] medical records are received in office and they will be faxed to the [facility] office.Her HR is under control (84 bpm) by [facility] EKG, she is on the diltiazem and Xarelto. I advised her to avoid caffeine and alcohol. She wanted sooner f/u than her August appointment so 07/04/23 apt was made by front office staff. She was scared that she needed to be seen sooner but with her HR in control, I advised her that she was on all appropriate medications and she is appeased. I told her to call if she developed SOB or rapid HR.

## 2023-06-21 NOTE — Telephone Encounter (Signed)
Sleep Apnea Evaluation  [facility]  Today's Date: 06/21/2023   Patient Name: [name]        DOB: [birth year]       Height:        Weight:    BMI: There is no height or weight on file to calculate BMI.    Referring Provider:  [place]   STOP-BANG RISK ASSESSMENT         If STOP-BANG Score ?3 OR two clinical symptoms - patient qualifies for WatchPAT (CPT 95800)      Sleep study ordered due to two (2) of the following clinical symptoms/diagnoses:  Excessive daytime sleepiness G47.10  Gastroesophageal reflux K21.9  Nocturia R35.1  Morning Headaches G44.221  Difficulty concentrating R41.840  Memory problems or poor judgment G31.84  Personality changes or irritability R45.4  Loud snoring R06.83  Depression F32.9  Unrefreshed by sleep G47.8  Impotence N52.9  History of high blood pressure R03.0  Insomnia G47.00  Sleep Disordered Breathing or Sleep Apnea ICD G47.33

## 2023-06-21 NOTE — Telephone Encounter (Signed)
Patient lives in [place] and request to pick up WatchPat device with instructions an sign waiver at the [place] office.

## 2023-06-21 NOTE — Telephone Encounter (Addendum)
Reports being asymptomatic at this time. Denies dizziness, chest pain, palpitations or sob. Reports she can feel her heart beating at times but not now. Reports being fatigue since October. Verified that she is currently taking xarelto 20 mg for h/o of blood clots. Advised this would be the treatment as well as heart rate controlling medications. Advised that she would be placed on wait list for a sooner appointment and if she develops worsening symptoms, she can go to the ED for an evaluation. Verbalized understanding of plan. Will forward to provider for review.

## 2023-06-21 NOTE — Telephone Encounter (Signed)
Pt came in office and stated that she was seen Monday by Dr. Phillips Odor and she is in [place]. Pt stated that she needed an appointment sooner than August. I have scheduled her in [place] for 07/04/2023 and adding her to the wait list. She asked if there was anything she should do in the meantime. She brought papers, including EKG, that their office is supposed to be faxing over. I have made copies.

## 2023-06-22 NOTE — Telephone Encounter (Addendum)
Stopbang score: 3  Patient will come to the office at 9:45 am to pick up device.

## 2023-06-28 ENCOUNTER — Telehealth: Payer: Self-pay | Admitting: *Deleted

## 2023-06-28 NOTE — Telephone Encounter (Signed)
Prior Authorization for ITAMAR sent to [facility] via web portal. Tracking Number .  NO PA REQ 

## 2023-06-30 NOTE — Telephone Encounter (Signed)
Spoke with patient regarding prior auth for sleep study and that she was ok to start test. Went over instructions again and she verbalized understanding.

## 2023-07-01 ENCOUNTER — Encounter (INDEPENDENT_AMBULATORY_CARE_PROVIDER_SITE_OTHER): Payer: HMO | Admitting: Cardiology

## 2023-07-01 DIAGNOSIS — G4733 Obstructive sleep apnea (adult) (pediatric): Secondary | ICD-10-CM

## 2023-07-03 ENCOUNTER — Ambulatory Visit: Payer: HMO | Attending: Internal Medicine

## 2023-07-03 DIAGNOSIS — I4892 Unspecified atrial flutter: Secondary | ICD-10-CM

## 2023-07-03 NOTE — Procedures (Signed)
SLEEP STUDY REPORT Patient Information Study Date: 07/01/2023 Patient Name: [name] Patient ID: [id number] Birth Date: [birth year] Age: 77 Gender: Female BMI: 33.9 (W=198 lb, H=5' 4'') Stopbang: 3 Referring Physician: Luane School, MD  TEST DESCRIPTION: Home sleep apnea testing was completed using the WatchPat, a Type 1 device, utilizing peripheral arterial tonometry (PAT), chest movement, actigraphy, pulse oximetry, pulse rate, body position and snore. AHI was calculated with apnea and hypopnea using valid sleep time as the denominator. RDI includes apneas, hypopneas, and RERAs. The data acquired and the scoring of sleep and all associated events were performed in accordance with the recommended standards and specifications as outlined in the AASM Manual for the Scoring of Sleep and Associated Events 2.2.0 (2015).   FINDINGS:   1. Mild Obstructive Sleep Apnea with AHI 6.5/hr overall but moderate during REM sleep with REM AHI 21.3/hr.  2. No Central Sleep Apnea with pAHIc 0/hr.   3. Oxygen desaturations as low as 81%.   4. Mild to moderate snoring was present. O2 sats were < 88% for 25.5 min.   5. Total sleep time was 5 hrs and 58 min.   6. 18.6% of total sleep time was spent in REM sleep.   7.  sleep onset latency at 18 min.   8.  REM sleep onset latency at 87 min.   9. Total awakenings were 4.  10. Arrhythmia detection:  None.  DIAGNOSIS: Mild Obstructive Sleep Apnea (G47.33) Nocturnal Hypoxemia  RECOMMENDATIONS:   1.  Clinical correlation of these findings is necessary.  The decision to treat obstructive sleep apnea (OSA) is usually based on the presence of apnea symptoms or the presence of associated medical conditions such as Hypertension, Congestive Heart Failure, Atrial Fibrillation or Obesity.  The most common symptoms of OSA are snoring, gasping for breath while sleeping, daytime sleepiness and fatigue.   2.  Initiating apnea therapy is recommended  given the presence of symptoms and/or associated conditions. Recommend proceeding with one of the following:     a.  Auto-CPAP therapy with a pressure range of 5-20cm H2O.     b.  An oral appliance (OA) that can be obtained from certain dentists with expertise in sleep medicine.  These are primarily of use in non-obese patients with mild and moderate disease.     c.  An ENT consultation which may be useful to look for specific causes of obstruction and possible treatment options.     d.  If patient is intolerant to PAP therapy, consider referral to ENT for evaluation for hypoglossal nerve stimulator.   3.  Close follow-up is necessary to ensure success with CPAP or oral appliance therapy for maximum benefit.  4.  A follow-up oximetry study on CPAP is recommended to assess the adequacy of therapy and determine the need for supplemental oxygen or the potential need for Bi-level therapy.  An arterial blood gas to determine the adequacy of baseline ventilation and oxygenation should also be considered.  5.  Healthy sleep recommendations include:  adequate nightly sleep (normal 7-9 hrs/night), avoidance of caffeine after noon and alcohol near bedtime, and maintaining a sleep environment that is cool, dark and quiet.  6.  Weight loss for overweight patients is recommended.  Even modest amounts of weight loss can significantly improve the severity of sleep apnea.  7.  Snoring recommendations include:  weight loss where appropriate, side sleeping, and avoidance of alcohol before bed.  8.  Operation of motor vehicle should  be avoided when sleepy.  Signature:   Armanda Magic, MD; [facility]; Diplomat, American Board of Sleep Medicine Electronically Signed: 07/03/2023 7:58:08 AM

## 2023-07-04 ENCOUNTER — Other Ambulatory Visit: Payer: Self-pay | Admitting: Internal Medicine

## 2023-07-04 ENCOUNTER — Ambulatory Visit: Payer: HMO | Attending: Internal Medicine | Admitting: Internal Medicine

## 2023-07-04 ENCOUNTER — Encounter: Payer: Self-pay | Admitting: Internal Medicine

## 2023-07-04 ENCOUNTER — Ambulatory Visit: Payer: HMO | Attending: Internal Medicine

## 2023-07-04 VITALS — BP 118/52 | HR 59 | Ht 64.0 in | Wt 199.4 lb

## 2023-07-04 DIAGNOSIS — I4892 Unspecified atrial flutter: Secondary | ICD-10-CM | POA: Diagnosis not present

## 2023-07-04 DIAGNOSIS — G4733 Obstructive sleep apnea (adult) (pediatric): Secondary | ICD-10-CM | POA: Diagnosis not present

## 2023-07-04 DIAGNOSIS — I498 Other specified cardiac arrhythmias: Secondary | ICD-10-CM

## 2023-07-04 DIAGNOSIS — I5032 Chronic diastolic (congestive) heart failure: Secondary | ICD-10-CM | POA: Diagnosis not present

## 2023-07-04 DIAGNOSIS — I5030 Unspecified diastolic (congestive) heart failure: Secondary | ICD-10-CM | POA: Insufficient documentation

## 2023-07-04 MED ORDER — FUROSEMIDE 20 MG PO TABS: 20.0000 mg | ORAL_TABLET | Freq: Two times a day (BID) | ORAL | 1 refills | Status: DC

## 2023-07-04 NOTE — Progress Notes (Signed)
Cardiology Office Note  Date: 07/04/2023   ID: Hailey, [name] 13-Dec-1946, MRN [id number]  PCP:  Assunta Found, MD  Cardiologist:  [name], MD Electrophysiologist:  None    History of Present Illness: [name] is a 77 y.o. female known to have chronic diastolic heart failure, paroxysmal atrial flutter, history of PE, DM 2, asthma is here for follow-up visit.  Patient had kidney stone surgery in 11/2022 following which she started to have bilateral lower extremity swelling associated with DOE.  Echocardiogram from 6/24 showed normal LVEF, G1 DD and no valvular heart disease. Due to occasional palpitations, SOB and fatigue, she had EKG done at PCPs office recently on 06/21/2023 that showed atrial flutter, rate controlled. Patient underwent home sleep study that showed mild OSA. Patient is here for follow-up visit.  She continues to have SOB but somewhat better compared to before. No orthopnea or PND.  She complains of leg swelling especially towards the end of the day. Currently on p.o. Lasix 20 mg once daily. Already taking rivaroxaban 20 mg daily with dinner due to history of PE in the past.  No angina, dizziness, syncope. Occasional palpitations and fatigue for many years.  Past Medical History:  Diagnosis Date   Anxiety 12/29/2022   Arthritis    Asthma    Carpal tunnel syndrome    Gastric ulcer    GERD (gastroesophageal reflux disease)    H/O hiatal hernia    Heart murmur    since birth   History of heart murmur in childhood    History of kidney stones    Iron deficiency anemia    Neuropathy    Following neck surgery.   Paralysis (HCC)    PONV (postoperative nausea and vomiting)    Pulmonary embolus [facility])    November 2020   Type 2 diabetes mellitus [facility])     Past Surgical History:  Procedure Laterality Date   APPENDECTOMY     BIOPSY  06/01/2022   Procedure: BIOPSY;  Surgeon: Malissa Hippo, MD;  [facility];  Service: Endoscopy;;    CHOLECYSTECTOMY     COLONOSCOPY     COLONOSCOPY N/A 09/26/2013   Procedure: COLONOSCOPY;  Surgeon: Malissa Hippo, MD;  [facility];  Service: Endoscopy;  Laterality: N/A;  1030-rescheduled to 12:00pm Ann notified pt   COLONOSCOPY N/A 03/27/2015   Procedure: COLONOSCOPY;  Surgeon: Malissa Hippo, MD;  [facility];  Service: Endoscopy;  Laterality: N/A;  11:15 - moved to 8:25 - Ann to notify pt   COLONOSCOPY WITH PROPOFOL N/A 06/01/2022   Procedure: COLONOSCOPY WITH PROPOFOL;  Surgeon: Malissa Hippo, MD;  [facility];  Service: Endoscopy;  Laterality: N/A;  1005 ASA 2   CYSTOSCOPY/URETEROSCOPY/HOLMIUM LASER/STENT PLACEMENT Left 12/23/2022   Procedure: CYSTOSCOPY/ LEFT RETROGRADE/ URETEROSCOPY/HOLMIUM LASER/STENT PLACEMENT;  Surgeon: Jerilee Field, MD;  Location: [facility];  Service: Urology;  Laterality: Left;   ESOPHAGOGASTRODUODENOSCOPY  12/08/2011   Procedure: ESOPHAGOGASTRODUODENOSCOPY (EGD);  Surgeon: Malissa Hippo, MD;  [facility];  Service: Endoscopy;  Laterality: N/A;  3:00    ESOPHAGOGASTRODUODENOSCOPY N/A 03/27/2015   Procedure: ESOPHAGOGASTRODUODENOSCOPY (EGD);  Surgeon: Malissa Hippo, MD;  [facility];  Service: Endoscopy;  Laterality: N/A;   EYE SURGERY Bilateral    cataract surgery   GIVENS CAPSULE STUDY N/A 11/04/2015   Procedure: GIVENS CAPSULE STUDY;  Surgeon: Malissa Hippo, MD;  [facility];  Service: Endoscopy;  Laterality: N/A;   Hysterscopy     KNEE ARTHROSCOPY     Left   NECK SURGERY     x 2    TONSILLECTOMY     TOTAL KNEE ARTHROPLASTY  07/11/2012   Procedure: TOTAL KNEE ARTHROPLASTY;  Surgeon: Loreta Ave, MD;  Location[facility] OR;  Service: Orthopedics;  Laterality: Left;  left total knee arthroplasty   UPPER GASTROINTESTINAL ENDOSCOPY     YAG LASER APPLICATION Right 02/21/2017   Procedure: YAG LASER APPLICATION;  Surgeon: Jethro Bolus, MD;  Location: [facility];  Service: Ophthalmology;   Laterality: Right;    Current Outpatient Medications  Medication Sig Dispense Refill   acetaminophen (TYLENOL) 500 MG tablet Take 500 mg by mouth every 6 (six) hours as needed for moderate pain or headache.     Cholecalciferol (VITAMIN D) 2000 UNITS CAPS Take 2,000 Units by mouth daily.     cyanocobalamin 1000 MCG tablet Take 1,000 mcg by mouth daily.     D-MANNOSE PO Take 1 tablet by mouth daily.     diazepam (VALIUM) 5 MG tablet Take 5 mg by mouth every 8 (eight) hours as needed for anxiety.     DILT-XR 180 MG 24 hr capsule Take 180 mg by mouth at bedtime.     ferrous sulfate 325 (65 FE) MG tablet Take 1 tablet (325 mg total) by mouth daily with breakfast.     furosemide (LASIX) 20 MG tablet TAKE 1 TABLET BY MOUTH EVERY DAY 90 tablet 1   gabapentin (NEURONTIN) 300 MG capsule Take 300 mg by mouth 3 (three) times daily as needed.     KLOR-CON M20 20 MEQ tablet TAKE 1 TABLET BY MOUTH EVERY DAY 90 tablet 1   metFORMIN (GLUCOPHAGE) 500 MG tablet Take 500 mg by mouth 2 (two) times daily with a meal.      metoprolol tartrate (LOPRESSOR) 25 MG tablet Take 0.5 tablets (12.5 mg total) by mouth 2 (two) times daily. Hold if SBP is <90 90 tablet 1   montelukast (SINGULAIR) 10 MG tablet Take 10 mg by mouth every morning.     olmesartan (BENICAR) 20 MG tablet Take 20 mg by mouth daily.     omeprazole (PRILOSEC) 20 MG capsule Take 20 mg by mouth 2 (two) times daily before a meal.     PEPPERMINT OIL PO Take 1 capsule by mouth daily. Intestinal Defense With Ginger and Fennel     Probiotic Product (PROBIOTIC DAILY PO) Take 1 capsule by mouth daily.     rivaroxaban (XARELTO) 20 MG TABS tablet Take 20 mg by mouth daily with supper.     Turmeric 450 MG CAPS Take 450 mg by mouth daily.     No current facility-administered medications for this visit.   Allergies:  Nsaids, Cephalexin, Darvocet [propoxyphene n-acetaminophen], Percocet [oxycodone-acetaminophen], and Penicillins   Social History: The patient   reports that she has never smoked. She has never been exposed to tobacco smoke. She has never used smokeless tobacco. She reports that she does not drink alcohol and does not use drugs.   Family History: The patient's family history includes Alzheimer's disease in her mother; Cancer in her maternal aunt and paternal grandmother; Diabetes in her maternal grandfather and maternal grandmother; Hypertension in her son; Kidney disease in her father, mother, and paternal uncle; Liver disease in her maternal uncle.   ROS:  Please see the history of present illness. Otherwise, complete review of systems is positive for none  All other systems are  reviewed and negative.   Physical Exam: VS:  BP (!) 118/52   Pulse (!) 59   Ht 5\' 4"  (1.626 m)   Wt 199 lb 6.4 oz (90.4 kg)   SpO2 94%   BMI 34.23 kg/m , BMI Body mass index is 34.23 kg/m.  Wt Readings from Last 3 Encounters:  07/04/23 199 lb 6.4 oz (90.4 kg)  05/03/23 198 lb 6.4 oz (90 kg)  03/13/23 190 lb (86.2 kg)    General: Patient appears comfortable at rest. HEENT: Conjunctiva and lids normal, oropharynx clear with moist mucosa. Neck: Supple, no elevated JVP or carotid bruits, no thyromegaly. Lungs: Clear to auscultation, nonlabored breathing at rest. Cardiac: Regular rate and rhythm, no S3 or significant systolic murmur, no pericardial rub. Abdomen: Soft, nontender, no hepatomegaly, bowel sounds present, no guarding or rebound. Extremities: No pitting edema, distal pulses 2+. Skin: Warm and dry. Musculoskeletal: No kyphosis. Neuropsychiatric: Alert and oriented x3, affect grossly appropriate.  Recent Labwork: 12/29/2022: ALT 19; AST 17; Magnesium 1.7 03/13/2023: Hemoglobin 12.1; Platelets 254 05/09/2023: BUN 28; Creatinine, Ser 1.19; Potassium 5.0; Sodium 139; TSH 1.020     Component Value Date/Time   CHOL 113 11/03/2017 0515   TRIG 65 10/27/2019 0010   HDL 59 11/03/2017 0515   CHOLHDL 1.9 11/03/2017 0515   VLDL 9 11/03/2017 0515    LDLCALC 45 11/03/2017 0515    Other Studies Reviewed Today:   Assessment and Plan:  # Chronic diastolic heart failure -SOB partially improved compared to before. No leg swelling during the day but develops it towards the end of the day.  No orthopnea or PND. Increase p.o. Lasix from 20 mg once daily to twice daily.  # Paroxysmal atrial flutter -EKG from 06/21/2023 showed atrial flutter, rate controlled.  EKG today in the clinic showed sinus bradycardia with first-degree AV block.  Obtain 2-week event monitor to quantify the burden of atrial arrhythmias.  Will continue current rate controlling agents, diltiazem 180 mg once daily and metoprolol tartrate 12.5 mg twice daily.  Continue rivaroxaban 20 mg daily with dinner.  She underwent home sleep study that showed mild OSA.  Awaiting sleep medicine recommendations.  # Mild OSA: She underwent home sleep study recently and was tested positive for mild OSA.  Awaiting sleep medicine recommendations.  # HTN, controlled: Continue above medications in addition to olmesartan 40 mg once daily.  # Type 2 diabetes mellitus: Currently on metformin 500 mg twice daily.  She says she will be started on Ozempic soon.   I have spent a total of 30 minutes with patient reviewing chart, EKGs, labs and examining patient as well as establishing an assessment and plan that was discussed with the patient.  > 50% of time was spent in direct patient care.    Medication Adjustments/Labs and Tests Ordered: Current medicines are reviewed at length with the patient today.  Concerns regarding medicines are outlined above.   Tests Ordered: No orders of the defined types were placed in this encounter.   Medication Changes: No orders of the defined types were placed in this encounter.   Disposition:  Follow up  6 months  Signed Adream Parzych Verne Spurr, MD, 07/04/2023 10:55 AM    [facility] [address] [place], [place], [place] [address]

## 2023-07-04 NOTE — Patient Instructions (Addendum)
Medication Instructions:  Your physician has recommended you make the following change in your medication:  Start taking Lasix 2 times daily Continue all other medications as prescribed  Labwork: None  Testing/Procedures: Your physician has recommended that you wear a Zio monitor.   This monitor is a medical device that records the heart's electrical activity. Doctors most often use these monitors to diagnose arrhythmias. Arrhythmias are problems with the speed or rhythm of the heartbeat. The monitor is a small device applied to your chest. You can wear one while you do your normal daily activities. While wearing this monitor if you have any symptoms to push the button and record what you felt. Once you have worn this monitor for the period of time provider prescribed (for 14 days), you will return the monitor device in the postage paid box. Once it is returned they will download the data collected and provide [place] with a report which the provider will then review and we will call you with those results. Important tips:  Avoid showering during the first 48 hours of wearing the monitor. Avoid excessive sweating to help maximize wear time. Do not submerge the device, no hot tubs, and no swimming pools. Keep any lotions or oils away from the patch. After 48 hours you may shower with the patch on. Take brief showers with your back facing the shower head.  Do not remove patch once it has been placed because that will interrupt data and decrease adhesive wear time. Push the button when you have any symptoms and write down what you were feeling. Once you have completed wearing your monitor, remove and place into box which has postage paid and place in your outgoing mailbox.  If for some reason you have misplaced your box then call our office and we can provide another box and/or mail it off for you.   Follow-Up: Your physician recommends that you schedule a follow-up appointment in: 6 months  Any  Other Special Instructions Will Be Listed Below (If Applicable).  HEART FAILURE INSTRUCTION SHEET  Follow a low-salt diet-you are allowed no more than 2,000 mg of sodium per day. Watch your fluid intake. In general, you should not be taking more than 64 ounces a day (no more than 8 glasses per day). Sometimes we refer to this as "2 liters per day." This includes sources of water in food like soup, coffee, tea, milk etc. Weigh yourself on the same scale at the same time of the day preferably immediately after your first void. Keep a log of your weights. Call your doctor: (Anytime you feel any of the following symptoms)  3 lbs weight gain overnight or 5 lbs within a week Shortness of breath, with or without a day hacking cough Swelling in hands, feet or stomach If you have to sleep on extra pillows at night in order to breathe   IT IS IMPORTANT TO LET YOUR DOCTOR KNOW EARLY ON IF YOU ARE HAVING SYMPTOMS SO WE CAN HELP YOU!   If you need a refill on your cardiac medications before your next appointment, please call your pharmacy.

## 2023-07-13 DIAGNOSIS — H524 Presbyopia: Secondary | ICD-10-CM | POA: Diagnosis not present

## 2023-07-13 DIAGNOSIS — E119 Type 2 diabetes mellitus without complications: Secondary | ICD-10-CM | POA: Diagnosis not present

## 2023-07-13 DIAGNOSIS — H52203 Unspecified astigmatism, bilateral: Secondary | ICD-10-CM | POA: Diagnosis not present

## 2023-07-13 DIAGNOSIS — Z961 Presence of intraocular lens: Secondary | ICD-10-CM | POA: Diagnosis not present

## 2023-07-13 NOTE — Addendum Note (Signed)
Addended by: Brunetta Genera on: 07/13/2023 04:10 PM   Modules accepted: Orders

## 2023-07-13 NOTE — Progress Notes (Signed)
Notified patient of sleep study results and recommendations. All questions (if any) were answered and patient verbalized understanding. CPAP Titration ordered for [facility] today 07/13/23.

## 2023-07-23 DIAGNOSIS — I498 Other specified cardiac arrhythmias: Secondary | ICD-10-CM | POA: Diagnosis not present

## 2023-08-01 ENCOUNTER — Telehealth: Payer: Self-pay | Admitting: Internal Medicine

## 2023-08-01 NOTE — Telephone Encounter (Signed)
Pt c/o BP issue: STAT if pt c/o blurred vision, one-sided weakness or slurred speech  1. What are your last 5 BP readings? 96/49  2. Are you having any other symptoms (ex. Dizziness, headache, blurred vision, passed out)? Dizziness  3. What is your BP issue? Pt is requesting a callback regarding her concerns of BP being at this number and constantly changing. Please advise

## 2023-08-01 NOTE — Telephone Encounter (Signed)
Reports BP was low today and she was dizzy and had chest pressure rated 4/10. BP was 96/49 & HR 40. Last BP was 97/39 & HR 63, and BP monitor showed she was in A-fib. Denies chest pain, sob, palpitations at this time. Reports she is staying well hydrated. Medications reviewed. Advised to hold benicar 20 mg in the morning. Nurse visit scheduled at the [facility] office at 4:15 pm tomorrow for EKG and vitals. Advised to bring home BP monitor to visit to be checked for accuracy. Advised if she develops worsening symptoms, to go to the ED for an evaluation. Verbalized understanding of plan.

## 2023-08-02 ENCOUNTER — Ambulatory Visit: Payer: HMO | Attending: Internal Medicine | Admitting: *Deleted

## 2023-08-02 VITALS — BP 126/62 | HR 76 | Ht 64.0 in | Wt 200.4 lb

## 2023-08-02 DIAGNOSIS — I4892 Unspecified atrial flutter: Secondary | ICD-10-CM | POA: Diagnosis not present

## 2023-08-02 NOTE — Progress Notes (Signed)
Pt in office for BP Check and EKG and to compare home monitor. Home monitor reads 132/67 67. Pt did not take Benicar today.

## 2023-08-03 NOTE — Telephone Encounter (Signed)
Patient informed and verbalized understanding of plan. 

## 2023-08-08 ENCOUNTER — Encounter: Payer: Self-pay | Admitting: Internal Medicine

## 2023-08-08 ENCOUNTER — Ambulatory Visit: Payer: HMO | Attending: Internal Medicine | Admitting: Internal Medicine

## 2023-08-08 VITALS — BP 118/66 | HR 66 | Ht 64.0 in | Wt 201.0 lb

## 2023-08-08 DIAGNOSIS — I4892 Unspecified atrial flutter: Secondary | ICD-10-CM

## 2023-08-08 NOTE — Progress Notes (Signed)
Cardiology Office Note  Date: 08/08/2023   ID: Hailey, [name] July 07, 1946, MRN [id number]  PCP:  Assunta Found, MD  Cardiologist:  [name], MD Electrophysiologist:  None    History of Present Illness: [name] is a 77 y.o. female known to have chronic diastolic heart failure, paroxysmal atrial flutter, history of PE, DM 2, asthma is here for follow-up visit.  Patient had kidney stone surgery in 11/2022 following which she started to have bilateral lower extremity swelling associated with DOE.  Echocardiogram from 6/24 showed normal LVEF, G1 DD and no valvular heart disease. Due to occasional palpitations, SOB and fatigue, she had EKG done at PCPs office recently on 06/21/2023 that showed atrial flutter, rate controlled. Patient underwent home sleep study that showed mild OSA. Patient is here for follow-up visit.   She was having low blood pressures 90/60 mmHg with low HR couple weeks ago due to which olmesartan was discontinued.  After which, her blood pressures were within normal limits.  Event monitor from 06/2023 showed 24% atrial flutter burden, 17 pauses (longest lasting 4.6 seconds) a few of which during the wakeful hours.  She was symptomatic with atrial flutter and reports she gets acutely short of breath when her rhythm is abnormal.  Otherwise, no orthopnea, PND, leg swelling, palpitations, angina or DOE.  Past Medical History:  Diagnosis Date   Anxiety 12/29/2022   Arthritis    Asthma    Carpal tunnel syndrome    Gastric ulcer    GERD (gastroesophageal reflux disease)    H/O hiatal hernia    Heart murmur    since birth   History of heart murmur in childhood    History of kidney stones    Iron deficiency anemia    Neuropathy    Following neck surgery.   Paralysis (HCC)    PONV (postoperative nausea and vomiting)    Pulmonary embolus [facility])    November 2020   Type 2 diabetes mellitus [facility])     Past Surgical History:  Procedure Laterality Date    APPENDECTOMY     BIOPSY  06/01/2022   Procedure: BIOPSY;  Surgeon: Malissa Hippo, MD;  [facility];  Service: Endoscopy;;   CHOLECYSTECTOMY     COLONOSCOPY     COLONOSCOPY N/A 09/26/2013   Procedure: COLONOSCOPY;  Surgeon: Malissa Hippo, MD;  [facility];  Service: Endoscopy;  Laterality: N/A;  1030-rescheduled to 12:00pm Ann notified pt   COLONOSCOPY N/A 03/27/2015   Procedure: COLONOSCOPY;  Surgeon: Malissa Hippo, MD;  [facility];  Service: Endoscopy;  Laterality: N/A;  11:15 - moved to 8:25 - Ann to notify pt   COLONOSCOPY WITH PROPOFOL N/A 06/01/2022   Procedure: COLONOSCOPY WITH PROPOFOL;  Surgeon: Malissa Hippo, MD;  Location[facility];  Service: Endoscopy;  Laterality: N/A;  1005 ASA 2   CYSTOSCOPY/URETEROSCOPY/HOLMIUM LASER/STENT PLACEMENT Left 12/23/2022   Procedure: CYSTOSCOPY/ LEFT RETROGRADE/ URETEROSCOPY/HOLMIUM LASER/STENT PLACEMENT;  Surgeon: Jerilee Field, MD;  Location: [facility];  Service: Urology;  Laterality: Left;   ESOPHAGOGASTRODUODENOSCOPY  12/08/2011   Procedure: ESOPHAGOGASTRODUODENOSCOPY (EGD);  Surgeon: Malissa Hippo, MD;  [facility];  Service: Endoscopy;  Laterality: N/A;  3:00    ESOPHAGOGASTRODUODENOSCOPY N/A 03/27/2015   Procedure: ESOPHAGOGASTRODUODENOSCOPY (EGD);  Surgeon: Malissa Hippo, MD;  [facility];  Service: Endoscopy;  Laterality: N/A;   EYE SURGERY Bilateral    cataract surgery   GIVENS CAPSULE STUDY  N/A 11/04/2015   Procedure: GIVENS CAPSULE STUDY;  Surgeon: Malissa Hippo, MD;  [facility];  Service: Endoscopy;  Laterality: N/A;   Hysterscopy     KNEE ARTHROSCOPY     Left   NECK SURGERY     x 2    TONSILLECTOMY     TOTAL KNEE ARTHROPLASTY  07/11/2012   Procedure: TOTAL KNEE ARTHROPLASTY;  Surgeon: Loreta Ave, MD;  [facility] OR;  Service: Orthopedics;  Laterality: Left;  left total knee arthroplasty   UPPER GASTROINTESTINAL ENDOSCOPY     YAG LASER  APPLICATION Right 02/21/2017   Procedure: YAG LASER APPLICATION;  Surgeon: Jethro Bolus, MD;  Location: [facility];  Service: Ophthalmology;  Laterality: Right;    Current Outpatient Medications  Medication Sig Dispense Refill   acetaminophen (TYLENOL) 500 MG tablet Take 500 mg by mouth every 6 (six) hours as needed for moderate pain or headache.     Cholecalciferol (VITAMIN D) 2000 UNITS CAPS Take 2,000 Units by mouth daily.     cyanocobalamin 1000 MCG tablet Take 1,000 mcg by mouth daily.     D-MANNOSE PO Take 1 tablet by mouth daily.     diazepam (VALIUM) 5 MG tablet Take 5 mg by mouth every 8 (eight) hours as needed for anxiety.     DILT-XR 180 MG 24 hr capsule Take 180 mg by mouth at bedtime.     ferrous sulfate 325 (65 FE) MG tablet Take 1 tablet (325 mg total) by mouth daily with breakfast.     furosemide (LASIX) 20 MG tablet Take 1 tablet (20 mg total) by mouth 2 (two) times daily. 90 tablet 1   gabapentin (NEURONTIN) 300 MG capsule Take 300 mg by mouth at bedtime.     KLOR-CON M20 20 MEQ tablet TAKE 1 TABLET BY MOUTH EVERY DAY 90 tablet 1   metFORMIN (GLUCOPHAGE) 500 MG tablet Take 500 mg by mouth 2 (two) times daily with a meal.      metoprolol tartrate (LOPRESSOR) 25 MG tablet Take 0.5 tablets (12.5 mg total) by mouth 2 (two) times daily. Hold if SBP is <90 90 tablet 1   montelukast (SINGULAIR) 10 MG tablet Take 10 mg by mouth every morning.     olmesartan (BENICAR) 20 MG tablet Take 20 mg by mouth daily.     omeprazole (PRILOSEC) 20 MG capsule Take 20 mg by mouth 2 (two) times daily before a meal.     PEPPERMINT OIL PO Take 1 capsule by mouth daily. Intestinal Defense With Ginger and Fennel     Probiotic Product (PROBIOTIC DAILY PO) Take 1 capsule by mouth daily.     rivaroxaban (XARELTO) 20 MG TABS tablet Take 20 mg by mouth daily with supper.     Turmeric 450 MG CAPS Take 450 mg by mouth daily.     No current facility-administered medications for this visit.   Allergies:  Nsaids,  Cephalexin, Darvocet [propoxyphene n-acetaminophen], Percocet [oxycodone-acetaminophen], and Penicillins   Social History: The patient  reports that she has never smoked. She has never been exposed to tobacco smoke. She has never used smokeless tobacco. She reports that she does not drink alcohol and does not use drugs.   Family History: The patient's family history includes Alzheimer's disease in her mother; Cancer in her maternal aunt and paternal grandmother; Diabetes in her maternal grandfather and maternal grandmother; Hypertension in her son; Kidney disease in her father, mother, and paternal uncle; Liver disease in her maternal uncle.  ROS:  Please see the history of present illness. Otherwise, complete review of systems is positive for none  All other systems are reviewed and negative.   Physical Exam: VS:  BP 118/66   Pulse 66   Ht 5\' 4"  (1.626 m)   Wt 201 lb (91.2 kg)   SpO2 96%   BMI 34.50 kg/m , BMI Body mass index is 34.5 kg/m.  Wt Readings from Last 3 Encounters:  08/08/23 201 lb (91.2 kg)  08/02/23 200 lb 6.4 oz (90.9 kg)  07/04/23 199 lb 6.4 oz (90.4 kg)    General: Patient appears comfortable at rest. HEENT: Conjunctiva and lids normal, oropharynx clear with moist mucosa. Neck: Supple, no elevated JVP or carotid bruits, no thyromegaly. Lungs: Clear to auscultation, nonlabored breathing at rest. Cardiac: Regular rate and rhythm, no S3 or significant systolic murmur, no pericardial rub. Abdomen: Soft, nontender, no hepatomegaly, bowel sounds present, no guarding or rebound. Extremities: No pitting edema, distal pulses 2+. Skin: Warm and dry. Musculoskeletal: No kyphosis. Neuropsychiatric: Alert and oriented x3, affect grossly appropriate.  Recent Labwork: 12/29/2022: ALT 19; AST 17; Magnesium 1.7 03/13/2023: Hemoglobin 12.1; Platelets 254 05/09/2023: BUN 28; Creatinine, Ser 1.19; Potassium 5.0; Sodium 139; TSH 1.020     Component Value Date/Time   CHOL 113  11/03/2017 0515   TRIG 65 10/27/2019 0010   HDL 59 11/03/2017 0515   CHOLHDL 1.9 11/03/2017 0515   VLDL 9 11/03/2017 0515   LDLCALC 45 11/03/2017 0515     Assessment and Plan:  # Paroxysmal atrial flutter -EKG from 06/21/2023 showed atrial flutter, rate controlled. EKG during the office visit showed NSR with first-degree AV block. Event monitor from 7/24 showed 24% atrial flutter burden and 17 pauses with the longest lasting 4.6 seconds. Symptomatic with atrial flutter (acutely SOB during the episode).  Will place referral electrophysiology for antiarrhythmic options for paroxysmal atrial flutter. Continue diltiazem 180 mg once daily and discontinue metoprolol. No dizziness, presyncope or syncope.  Diagnosed with mild OSA, will reach out to Dr. Norris Cross office for further recommendations. She will benefit from CPAP therapy due to paroxysmal atrial flutter, pauses etc.  # Mild OSA -Home sleep study showed mild OSA, waiting for recommendations from Dr. Norris Cross office.  # Chronic diastolic heart failure -SOB much better on p.o. Lasix 20 mg twice daily which I will continue.  # HTN, controlled: Continue above medications.  # Type 2 diabetes mellitus: Currently on metformin 500 mg twice daily, HbA1c improved to 6.   I have spent a total of 30 minutes with patient reviewing chart, EKGs, labs and examining patient as well as establishing an assessment and plan that was discussed with the patient.  > 50% of time was spent in direct patient care.    Medication Adjustments/Labs and Tests Ordered: Current medicines are reviewed at length with the patient today.  Concerns regarding medicines are outlined above.    Disposition:  Follow up  6 months  Signed  Verne Spurr, MD, 08/08/2023 11:00 AM    [facility] at [facility] [address] [place], [place], [place] [address]

## 2023-08-08 NOTE — Patient Instructions (Signed)
Medication Instructions:  Your physician has recommended you make the following change in your medication:   -Stop Metoprolol  *If you need a refill on your cardiac medications before your next appointment, please call your pharmacy*   Lab Work: None If you have labs (blood work) drawn today and your tests are completely normal, you will receive your results only by: MyChart Message (if you have MyChart) OR A paper copy in the mail If you have any lab test that is abnormal or we need to change your treatment, we will call you to review the results.   Testing/Procedures: None   Follow-Up: At [facility], you and your health needs are our priority.  As part of our continuing mission to provide you with exceptional heart care, we have created designated Provider Care Teams.  These Care Teams include your primary Cardiologist (physician) and Advanced Practice Providers (APPs -  Physician Assistants and Nurse Practitioners) who all work together to provide you with the care you need, when you need it.  We recommend signing up for the patient portal called "MyChart".  Sign up information is provided on this After Visit Summary.  MyChart is used to connect with patients for Virtual Visits (Telemedicine).  Patients are able to view lab/test results, encounter notes, upcoming appointments, etc.  Non-urgent messages can be sent to your provider as well.   To learn more about what you can do with MyChart, go to [email or web address].    Your next appointment:   6 month(s)  Provider:   Luane School, MD    Other Instructions You have been referred to EP. They will call you with your first appointment.

## 2023-08-09 ENCOUNTER — Ambulatory Visit (HOSPITAL_COMMUNITY)
Admission: RE | Admit: 2023-08-09 | Discharge: 2023-08-09 | Disposition: A | Discharge status: Home / Self Care | Payer: HMO | Source: Ambulatory Visit | Attending: Family Medicine | Admitting: Family Medicine

## 2023-08-09 ENCOUNTER — Other Ambulatory Visit (HOSPITAL_COMMUNITY): Payer: Self-pay | Admitting: Family Medicine

## 2023-08-09 DIAGNOSIS — I700 Atherosclerosis of aorta: Secondary | ICD-10-CM | POA: Diagnosis not present

## 2023-08-09 DIAGNOSIS — J069 Acute upper respiratory infection, unspecified: Secondary | ICD-10-CM | POA: Diagnosis not present

## 2023-08-09 DIAGNOSIS — Z6836 Body mass index (BMI) 36.0-36.9, adult: Secondary | ICD-10-CM | POA: Diagnosis not present

## 2023-08-09 DIAGNOSIS — J4530 Mild persistent asthma, uncomplicated: Secondary | ICD-10-CM | POA: Diagnosis not present

## 2023-08-09 DIAGNOSIS — K449 Diaphragmatic hernia without obstruction or gangrene: Secondary | ICD-10-CM | POA: Diagnosis not present

## 2023-08-09 DIAGNOSIS — E6609 Other obesity due to excess calories: Secondary | ICD-10-CM | POA: Diagnosis not present

## 2023-08-09 DIAGNOSIS — R911 Solitary pulmonary nodule: Secondary | ICD-10-CM | POA: Diagnosis not present

## 2023-08-18 ENCOUNTER — Other Ambulatory Visit (HOSPITAL_COMMUNITY): Payer: Self-pay | Admitting: Family Medicine

## 2023-08-18 DIAGNOSIS — R911 Solitary pulmonary nodule: Secondary | ICD-10-CM

## 2023-08-21 ENCOUNTER — Ambulatory Visit (HOSPITAL_COMMUNITY)
Admission: RE | Admit: 2023-08-21 | Discharge: 2023-08-21 | Disposition: A | Discharge status: Home / Self Care | Payer: HMO | Source: Ambulatory Visit | Attending: Family Medicine | Admitting: Family Medicine

## 2023-08-21 DIAGNOSIS — R911 Solitary pulmonary nodule: Secondary | ICD-10-CM

## 2023-08-21 DIAGNOSIS — I700 Atherosclerosis of aorta: Secondary | ICD-10-CM | POA: Diagnosis not present

## 2023-08-21 DIAGNOSIS — K449 Diaphragmatic hernia without obstruction or gangrene: Secondary | ICD-10-CM | POA: Diagnosis not present

## 2023-08-21 DIAGNOSIS — R918 Other nonspecific abnormal finding of lung field: Secondary | ICD-10-CM | POA: Diagnosis not present

## 2023-08-29 ENCOUNTER — Telehealth: Payer: Self-pay | Admitting: Internal Medicine

## 2023-08-29 DIAGNOSIS — M7989 Other specified soft tissue disorders: Secondary | ICD-10-CM

## 2023-08-29 DIAGNOSIS — I5032 Chronic diastolic (congestive) heart failure: Secondary | ICD-10-CM

## 2023-08-29 MED ORDER — MEDICAL COMPRESSION STOCKINGS MISC: 1.0000 | 0 refills | Status: DC

## 2023-08-29 MED ORDER — AMIODARONE HCL 200 MG PO TABS: 200.0000 mg | ORAL_TABLET | Freq: Two times a day (BID) | ORAL | 1 refills | Status: DC

## 2023-08-29 MED ORDER — FUROSEMIDE 20 MG PO TABS: 20.0000 mg | ORAL_TABLET | Freq: Two times a day (BID) | ORAL | 1 refills | Status: DC

## 2023-08-29 NOTE — Telephone Encounter (Signed)
Consult with [facility] was rescheduled from 09/04/2023 to 10/19/2023 due to a change in the providers schedule. Denies current symptoms of dizziness, chest pain or SOB. Reports checking BP and HR daily in the morning and says her readings are normal. Reports resting HR has been up to 120 several times since her visit with Mallipeddi in August 2024 and metoprolol was stopped. Medications reviewed. Advised that an attempt was made to get a sooner appointment with [facility] but none available at this time. Advised that she has been placed on [facility]'s wait list if someone cancels. Advised if she develops symptoms to contact our office or have worsening symptoms, to go to the ED for an evaluation. Verbalized understanding.

## 2023-08-29 NOTE — Telephone Encounter (Addendum)
Patient informed and agrees to starting amiodarone. Verbalized understanding of plan.  Patient wanted to report that she does still have problems with swelling in legs and feet although she takes furosemide 20 mg twice daily along with K+ 20 meq daily. Reports drinking 2-3 Circa bottles per day plus what she drinks with her meals. Denies adding salt to foods. Reports she does void frequently. Reports she weighs daily and average between 198 lbs - 202 lbs. Today reports weight was 201 lbs and yesterday 203 lbs, Sunday 202 lbs. Reports she wakes up with swelling in her legs and feet and it gets worse in the evening. Does not wear compression stockings. Will send to provider for recommendation on fluid status.

## 2023-08-29 NOTE — Telephone Encounter (Signed)
Rx for compression stockings faxed to [facility] [phone]

## 2023-08-29 NOTE — Telephone Encounter (Signed)
Calling to see if she needs to be on any medication. Wasn't able to get appt with Dr. Nelly Laurence until October. Please advise

## 2023-08-29 NOTE — Telephone Encounter (Signed)
  Pt requested to send this message to Dr. Jenene Slicker. Pt's appt with Dr. Nelly Laurence has been reschedule due to a changed in his schedule. She would like to ask ask Dr. Jenene Slicker what medication she can take while waiting for her appt.

## 2023-08-29 NOTE — Addendum Note (Signed)
Addended by: Eustace Moore on: 08/29/2023 05:00 PM   Modules accepted: Orders

## 2023-08-29 NOTE — Addendum Note (Signed)
Addended by: Eustace Moore on: 08/29/2023 04:45 PM   Modules accepted: Orders

## 2023-08-31 ENCOUNTER — Other Ambulatory Visit: Payer: Self-pay | Admitting: *Deleted

## 2023-08-31 ENCOUNTER — Telehealth: Payer: Self-pay | Admitting: Internal Medicine

## 2023-08-31 DIAGNOSIS — M7989 Other specified soft tissue disorders: Secondary | ICD-10-CM

## 2023-08-31 DIAGNOSIS — I5032 Chronic diastolic (congestive) heart failure: Secondary | ICD-10-CM | POA: Diagnosis not present

## 2023-08-31 NOTE — Telephone Encounter (Signed)
Contacted and advised that [facility]NP order was released and [facility] should be able to see it now.  Verbalized understanding.

## 2023-08-31 NOTE — Telephone Encounter (Signed)
See previous phone note for details 

## 2023-08-31 NOTE — Telephone Encounter (Signed)
Patient is following up. She states she is at [facility] now and they do not have the order(s) for lab work.

## 2023-09-01 ENCOUNTER — Telehealth: Payer: Self-pay | Admitting: Internal Medicine

## 2023-09-01 ENCOUNTER — Other Ambulatory Visit: Payer: Self-pay | Admitting: *Deleted

## 2023-09-01 MED ORDER — MEDICAL COMPRESSION STOCKINGS MISC: 1.0000 | 0 refills | Status: DC

## 2023-09-01 NOTE — Telephone Encounter (Signed)
Patient called stating she would like the order for the compression stocking to go the [facility].

## 2023-09-01 NOTE — Telephone Encounter (Signed)
Compression stocking order sent to [facility] as requested.

## 2023-09-02 LAB — BRAIN NATRIURETIC PEPTIDE: BNP: 35.8 pg/mL (ref 0.0–100.0)

## 2023-09-04 ENCOUNTER — Ambulatory Visit: Payer: HMO | Admitting: Cardiovascular Disease

## 2023-09-05 ENCOUNTER — Ambulatory Visit: Payer: HMO | Admitting: Cardiology

## 2023-09-06 NOTE — Progress Notes (Unsigned)
Electrophysiology Office Note:   Date:  09/07/2023  ID:  [name], [name] December 15, 1946, MRN [id number]  Primary Cardiologist: [name], MD Electrophysiologist: [name], MD      History of Present Illness:   [name] is a 77 y.o. female with h/o chronic diastolic heart failure, atrial flutter, pulmonary embolus, type 2 diabetes, mild obstructive sleep apnea, and asthma who is seen today for evaluation of atrial flutter at the request of Dr. Jenene Slicker.  Patient reportedly had an EKG done at her PCP office on 06/21/2023 which revealed atrial flutter.  She then wore an outpatient cardiac monitor which showed a 24% atrial fib/flutter burden.  She has been started on diltiazem. Amiodarone was prescribed but she did not start taking due to fear of side effects.  She is in normal sinus rhythm today.  She reports feeling much better today and when in normal sinus rhythm then when she is having episodes of atrial fibrillation.  During her episodes of a atrial fibrillation she reports palpitations, dizziness/ lightheadedness, fatigue and shortness of breath.  Review of systems complete and found to be negative unless listed in HPI.   EP Information / Studies Reviewed:    EKG is ordered today. Personal review as below.  EKG Interpretation Date/Time:  Thursday September 07 2023 11:39:48 EDT Ventricular Rate:  86 PR Interval:  224 QRS Duration:  86 QT Interval:  368 QTC Calculation: 440 R Axis:   86  Text Interpretation: Sinus rhythm with 1st degree A-V block When compared with ECG of 02-Aug-2023 16:45, No significant change was found Confirmed by [name] [phone]) on 09/07/2023 12:36:00 PM   ZIO monitor 07/24/2023: Coarse atrial fibrillation, possible flutter, and a postconversion pause of 4.6 seconds.  EKG 06/21/2023: Coarse atrial fibrillation.  Echocardiogram 06/14/2023: LVEF is 60 to 65%.  Grade 1 diastolic dysfunction. No significant valvular disease.  Left and  right atrial size were reported as normal.  Nuclear stress test 11/03/2017: Reported as low risk with soft tissue attenuation artifacts.  Risk Assessment/Calculations:    CHA2DS2-VASc Score = 5    This indicates a 7.2% annual risk of stroke. The patient's score is based upon: CHF History: 1 HTN History: 0 Diabetes History: 1 Stroke History: 0 Vascular Disease History: 0 Age Score: 2 Gender Score: 1      Physical Exam:   VS:  BP 130/70   Pulse 86   Ht 5\' 4"  (1.626 m)   Wt 204 lb 6.4 oz (92.7 kg)   SpO2 95%   BMI 35.09 kg/m    Wt Readings from Last 3 Encounters:  09/07/23 204 lb 6.4 oz (92.7 kg)  08/08/23 201 lb (91.2 kg)  08/02/23 200 lb 6.4 oz (90.9 kg)     GEN: Well nourished, well developed in no acute distress NECK: No JVD; No carotid bruits CARDIAC: Regular rate and rhythm, soft systolic murmur, rubs, gallops RESPIRATORY:  Clear to auscultation without rales, wheezing or rhonchi  ABDOMEN: Soft, non-tender, non-distended EXTREMITIES:  No edema; No deformity   ASSESSMENT AND PLAN:   Paroxysmal atrial fibrillation: She is symptomatic. EKG and ZIO monitor show coarse atrial fibrillation, possibly an atrial flutter as well.  She is currently on diltiazem and Xarelto for stroke prophylaxis.  Discussed extensively options for rhythm control including flecainide versus catheter ablation.  Risk and benefits of catheter ablation were discussed extensively and she voiced understanding. -Patient would like to weigh options of pharmacologic versus ablation for rhythm control and will let us  know when she decides. If patient prefers medication then we will perform a stress test and, if normal, start flecainide 100 twice daily with usual surveillance - EKG and labs. -Continue diltiazem for now.  2.  Hypercoagulable state secondary to atrial fibrillation: Tolerating Xarelto without issue.  -Continue rivaroxaban 20 mg once daily.  3.  Chronic diastolic heart failure: Appears well  compensated on exam today.  Given history of diastolic heart failure, she would benefit most from rhythm control.  -Continue current regimen of furosemide and follow-up with general cardiology.  4.  Obstructive sleep apnea: She had a home sleep study that was suggestive of sleep apnea.   -Formal sleep study is scheduled.   Follow up will be determined by her decision regarding medication versus ablation.  Signed, [name], MD

## 2023-09-07 ENCOUNTER — Ambulatory Visit: Payer: HMO | Attending: Cardiovascular Disease | Admitting: Cardiology

## 2023-09-07 ENCOUNTER — Institutional Professional Consult (permissible substitution): Payer: HMO | Admitting: Cardiology

## 2023-09-07 ENCOUNTER — Encounter: Payer: Self-pay | Admitting: Cardiology

## 2023-09-07 VITALS — BP 130/70 | HR 86 | Ht 64.0 in | Wt 204.4 lb

## 2023-09-07 DIAGNOSIS — I480 Paroxysmal atrial fibrillation: Secondary | ICD-10-CM | POA: Diagnosis not present

## 2023-09-07 DIAGNOSIS — D6869 Other thrombophilia: Secondary | ICD-10-CM | POA: Diagnosis not present

## 2023-09-07 NOTE — Patient Instructions (Addendum)
Medication Instructions:  Your physician recommends that you continue on your current medications as directed. Please refer to the Current Medication list given to you today.  *If you need a refill on your cardiac medications before your next appointment, please call your pharmacy*  Follow-Up: At [facility], you and your health needs are our priority.  As part of our continuing mission to provide you with exceptional heart care, we have created designated Provider Care Teams.  These Care Teams include your primary Cardiologist (physician) and Advanced Practice Providers (APPs -  Physician Assistants and Nurse Practitioners) who all work together to provide you with the care you need, when you need it.  Your next appointment:   As needed with Dr. Jimmey Ralph   Call us back if you would like to schedule an ablation or start on flecainide

## 2023-09-11 ENCOUNTER — Telehealth: Payer: Self-pay | Admitting: Cardiology

## 2023-09-11 DIAGNOSIS — I480 Paroxysmal atrial fibrillation: Secondary | ICD-10-CM

## 2023-09-11 NOTE — Telephone Encounter (Signed)
Patient called to schedule her ablation.

## 2023-09-13 NOTE — Telephone Encounter (Signed)
Pt is scheduled for Afib Ablation with Dr. Jimmey Ralph on 10/14 at 7:30 AM.  CT is scheduled on 10/7 at 10:00 AM. She will have labwork done at [facility] on 9/25.  I will mail her Instruction letters to her once completed.

## 2023-09-14 ENCOUNTER — Ambulatory Visit (HOSPITAL_BASED_OUTPATIENT_CLINIC_OR_DEPARTMENT_OTHER): Payer: HMO | Attending: Cardiology | Admitting: Cardiology

## 2023-09-14 VITALS — Ht 64.0 in | Wt 200.0 lb

## 2023-09-14 DIAGNOSIS — I4891 Unspecified atrial fibrillation: Secondary | ICD-10-CM | POA: Diagnosis not present

## 2023-09-14 DIAGNOSIS — G4733 Obstructive sleep apnea (adult) (pediatric): Secondary | ICD-10-CM | POA: Diagnosis not present

## 2023-09-19 NOTE — Procedures (Signed)
Patient Name: [name], [place] Date: 09/14/2023 Gender: Female D.O.B: 02-Oct-1946 Age (years): 64 Referring Provider: Armanda Magic MD, ABSM Height (inches): 64 Interpreting Physician: Armanda Magic MD, ABSM Weight (lbs): 200 RPSGT: Armen Pickup BMI: 34 MRN: [id number] Neck Size: 15.00  CLINICAL INFORMATION The patient is referred for a CPAP titration to treat sleep apnea.  SLEEP STUDY TECHNIQUE As per the AASM Manual for the Scoring of Sleep and Associated Events v2.3 (April 2016) with a hypopnea requiring 4% desaturations.  The channels recorded and monitored were frontal, central and occipital EEG, electrooculogram (EOG), submentalis EMG (chin), nasal and oral airflow, thoracic and abdominal wall motion, anterior tibialis EMG, snore microphone, electrocardiogram, and pulse oximetry. Continuous positive airway pressure (CPAP) was initiated at the beginning of the study and titrated to treat sleep-disordered breathing.  MEDICATIONS Medications self-administered by patient taken the night of the study : N/A  TECHNICIAN COMMENTS Comments added by technician: Pt had one restroom visted. Patient had difficulty initiating sleep. Comments added by scorer: N/A  RESPIRATORY PARAMETERS Optimal PAP Pressure (cm):9  AHI at Optimal Pressure (/hr):0 Overall Minimal O2 (%):86.0  Supine % at Optimal Pressure (%):56 Minimal O2 at Optimal Pressure (%): 86.0   SLEEP ARCHITECTURE The study was initiated at 10:17:52 PM and ended at 4:30:51 AM.  Sleep onset time was 36.9 minutes and the sleep efficiency was 83.0%. The total sleep time was 309.5 minutes.  The patient spent 2.3% of the night in stage N1 sleep, 67.5% in stage N2 sleep, 0.0% in stage N3 and 30.2% in REM.Stage REM latency was 149.0 minutes  Wake after sleep onset was 26.6. Alpha intrusion was absent. Supine sleep was 59.30%.  CARDIAC DATA The 2 lead EKG demonstrated Atrial FIbrillation. The mean heart rate was 78.7 beats per  minute. Other EKG findings include: None.  LEG MOVEMENT DATA The total Periodic Limb Movements of Sleep (PLMS) were 0. The PLMS index was 0.0. A PLMS index of <15 is considered normal in adults.  IMPRESSIONS - The optimal PAP pressure was 9 cm of water. - Moderate oxygen desaturations were observed during this titration (min O2 = 86.0%). - No snoring was audible during this study. - Atrial Fibrillation observed during this study. - Clinically significant periodic limb movements were not noted during this study. Arousals associated with PLMs were significant.  DIAGNOSIS - Obstructive Sleep Apnea (G47.33) - Atrial Fibrillation  RECOMMENDATIONS - Trial of ResMed CPAP therapy on 9 cm H2O with a Small size Resmed Full Face AirFit F10 for Her mask and heated humidification. - Avoid alcohol, sedatives and other CNS depressants that may worsen sleep apnea and disrupt normal sleep architecture. - Sleep hygiene should be reviewed to assess factors that may improve sleep quality. - Weight management and regular exercise should be initiated or continued. - Return to Sleep Center for re-evaluation after 4 weeks of therapy  [Electronically signed] 09/19/2023 06:48 PM  Armanda Magic MD, ABSM Diplomate, American Board of Sleep Medicine

## 2023-09-20 ENCOUNTER — Encounter (INDEPENDENT_AMBULATORY_CARE_PROVIDER_SITE_OTHER): Payer: Self-pay | Admitting: *Deleted

## 2023-09-20 DIAGNOSIS — E1159 Type 2 diabetes mellitus with other circulatory complications: Secondary | ICD-10-CM | POA: Diagnosis not present

## 2023-09-20 DIAGNOSIS — E7849 Other hyperlipidemia: Secondary | ICD-10-CM | POA: Diagnosis not present

## 2023-09-20 DIAGNOSIS — I480 Paroxysmal atrial fibrillation: Secondary | ICD-10-CM | POA: Diagnosis not present

## 2023-09-20 DIAGNOSIS — E6609 Other obesity due to excess calories: Secondary | ICD-10-CM | POA: Diagnosis not present

## 2023-09-20 DIAGNOSIS — D509 Iron deficiency anemia, unspecified: Secondary | ICD-10-CM | POA: Diagnosis not present

## 2023-09-20 DIAGNOSIS — I4891 Unspecified atrial fibrillation: Secondary | ICD-10-CM | POA: Diagnosis not present

## 2023-09-20 DIAGNOSIS — E782 Mixed hyperlipidemia: Secondary | ICD-10-CM | POA: Diagnosis not present

## 2023-09-20 DIAGNOSIS — I10 Essential (primary) hypertension: Secondary | ICD-10-CM | POA: Diagnosis not present

## 2023-09-20 DIAGNOSIS — Z6837 Body mass index (BMI) 37.0-37.9, adult: Secondary | ICD-10-CM | POA: Diagnosis not present

## 2023-09-21 LAB — CBC
Hematocrit: 43 % (ref 34.0–46.6)
Hemoglobin: 13.8 g/dL (ref 11.1–15.9)
MCH: 30.5 pg (ref 26.6–33.0)
MCHC: 32.1 g/dL (ref 31.5–35.7)
MCV: 95 fL (ref 79–97)
Platelets: 304 10*3/uL (ref 150–450)
RBC: 4.52 x10E6/uL (ref 3.77–5.28)
RDW: 13.3 % (ref 11.7–15.4)
WBC: 8.6 10*3/uL (ref 3.4–10.8)

## 2023-09-26 ENCOUNTER — Telehealth: Payer: Self-pay | Admitting: *Deleted

## 2023-09-26 NOTE — Telephone Encounter (Signed)
The patient has been notified of the result and verbalized understanding.  All questions (if any) were answered. Latrelle Dodrill, CMA 09/26/2023 12:14 PM    Patient wants to talk to her cardiologist and get back to [place].

## 2023-09-26 NOTE — Telephone Encounter (Signed)
-----   Message from Armanda Magic sent at 09/19/2023  6:50 PM EDT ----- Please let patient know that they had a successful PAP titration and let DME know that orders are in EPIC.  Please set up 6 week OV with me. Get ONO on CPAP

## 2023-10-02 ENCOUNTER — Ambulatory Visit (HOSPITAL_COMMUNITY)
Admission: RE | Admit: 2023-10-02 | Discharge: 2023-10-02 | Disposition: A | Discharge status: Home / Self Care | Payer: HMO | Source: Ambulatory Visit | Attending: Cardiology | Admitting: Cardiology

## 2023-10-02 DIAGNOSIS — I480 Paroxysmal atrial fibrillation: Secondary | ICD-10-CM | POA: Insufficient documentation

## 2023-10-02 LAB — POCT I-STAT CREATININE: Creatinine, Ser: 1.1 mg/dL — ABNORMAL HIGH (ref 0.44–1.00)

## 2023-10-02 MED ORDER — IOHEXOL 350 MG/ML SOLN
95.0000 mL | Freq: Once | INTRAVENOUS | Status: AC | PRN
  Administered 2023-10-02: 95 mL via INTRAVENOUS

## 2023-10-04 ENCOUNTER — Telehealth: Payer: Self-pay | Admitting: Internal Medicine

## 2023-10-04 NOTE — Telephone Encounter (Signed)
Pt states she needs to speak with April about her upcoming ablation. Please advise

## 2023-10-04 NOTE — Telephone Encounter (Signed)
Pt was put on Azithromycin for her Asthma - she was wheezing. She just wanted to let us know since she is having her Ablation on Monday 10/14. Her last dose of Azithromycin is Friday 10/11.  I told her this would not interact with her Ablation and it would be fine to continue with her procedure.

## 2023-10-06 ENCOUNTER — Telehealth: Payer: Self-pay | Admitting: Cardiology

## 2023-10-06 NOTE — Telephone Encounter (Signed)
Patient stated she wants a call back to discuss her recent test results as she is due for a procedure on Monday (10/14).

## 2023-10-06 NOTE — Telephone Encounter (Signed)
Spoke with the patient and reviewed lab and CT results with her. Patient verbalized understanding

## 2023-10-06 NOTE — Pre-Procedure Instructions (Signed)
Instructed patient on the following items: Arrival time 0515 Nothing to eat or drink after midnight No meds AM of procedure Responsible person to drive you home and stay with you for 24 hrs  Have you missed any doses of anti-coagulant Xarelto- takes once a day, hasn't missed any doses.  Don't take any medications on Monday morning.

## 2023-10-09 ENCOUNTER — Other Ambulatory Visit: Payer: Self-pay

## 2023-10-09 ENCOUNTER — Ambulatory Visit (HOSPITAL_BASED_OUTPATIENT_CLINIC_OR_DEPARTMENT_OTHER): Payer: HMO | Admitting: Certified Registered"

## 2023-10-09 ENCOUNTER — Ambulatory Visit (HOSPITAL_COMMUNITY)
Admission: RE | Admit: 2023-10-09 | Discharge: 2023-10-09 | Disposition: A | Discharge status: Home / Self Care | Payer: HMO | Attending: Cardiology | Admitting: Cardiology

## 2023-10-09 ENCOUNTER — Ambulatory Visit (HOSPITAL_COMMUNITY): Payer: HMO | Admitting: Certified Registered"

## 2023-10-09 ENCOUNTER — Encounter (HOSPITAL_COMMUNITY)
Admission: RE | Disposition: A | Discharge status: Home / Self Care | Payer: Self-pay | Source: Home / Self Care | Attending: Cardiology

## 2023-10-09 DIAGNOSIS — E119 Type 2 diabetes mellitus without complications: Secondary | ICD-10-CM | POA: Diagnosis not present

## 2023-10-09 DIAGNOSIS — D6859 Other primary thrombophilia: Secondary | ICD-10-CM | POA: Insufficient documentation

## 2023-10-09 DIAGNOSIS — G4733 Obstructive sleep apnea (adult) (pediatric): Secondary | ICD-10-CM | POA: Insufficient documentation

## 2023-10-09 DIAGNOSIS — J45909 Unspecified asthma, uncomplicated: Secondary | ICD-10-CM | POA: Diagnosis not present

## 2023-10-09 DIAGNOSIS — I5032 Chronic diastolic (congestive) heart failure: Secondary | ICD-10-CM | POA: Insufficient documentation

## 2023-10-09 DIAGNOSIS — I4892 Unspecified atrial flutter: Secondary | ICD-10-CM | POA: Diagnosis not present

## 2023-10-09 DIAGNOSIS — Z86711 Personal history of pulmonary embolism: Secondary | ICD-10-CM | POA: Insufficient documentation

## 2023-10-09 DIAGNOSIS — I110 Hypertensive heart disease with heart failure: Secondary | ICD-10-CM | POA: Insufficient documentation

## 2023-10-09 DIAGNOSIS — I480 Paroxysmal atrial fibrillation: Secondary | ICD-10-CM

## 2023-10-09 DIAGNOSIS — Z7901 Long term (current) use of anticoagulants: Secondary | ICD-10-CM | POA: Diagnosis not present

## 2023-10-09 HISTORY — PX: ATRIAL FIBRILLATION ABLATION: EP1191

## 2023-10-09 LAB — BASIC METABOLIC PANEL
Anion gap: 14 (ref 5–15)
BUN: 22 mg/dL (ref 8–23)
CO2: 23 mmol/L (ref 22–32)
Calcium: 9.8 mg/dL (ref 8.9–10.3)
Chloride: 102 mmol/L (ref 98–111)
Creatinine, Ser: 1.26 mg/dL — ABNORMAL HIGH (ref 0.44–1.00)
GFR, Estimated: 44 mL/min — ABNORMAL LOW (ref >60)
Glucose, Bld: 137 mg/dL — ABNORMAL HIGH (ref 70–99)
Potassium: 4.1 mmol/L (ref 3.5–5.1)
Sodium: 139 mmol/L (ref 135–145)

## 2023-10-09 LAB — POCT ACTIVATED CLOTTING TIME
Activated Clotting Time: 452 s
Activated Clotting Time: 348 s

## 2023-10-09 LAB — GLUCOSE, CAPILLARY
Glucose-Capillary: 136 mg/dL — ABNORMAL HIGH (ref 70–99)
Glucose-Capillary: 117 mg/dL — ABNORMAL HIGH (ref 70–99)

## 2023-10-09 SURGERY — ATRIAL FIBRILLATION ABLATION
Anesthesia: General

## 2023-10-09 MED ORDER — FENTANYL CITRATE (PF) 100 MCG/2ML IJ SOLN
INTRAMUSCULAR | Status: AC
  Filled 2023-10-09: qty 2

## 2023-10-09 MED ORDER — PROTAMINE SULFATE 10 MG/ML IV SOLN
INTRAVENOUS | Status: DC | PRN
Start: 2023-10-09 — End: 2023-10-09
  Administered 2023-10-09: 35 mg via INTRAVENOUS

## 2023-10-09 MED ORDER — RIVAROXABAN 20 MG PO TABS
20.0000 mg | ORAL_TABLET | Freq: Once | ORAL | Status: AC
  Administered 2023-10-09: 20 mg via ORAL
  Filled 2023-10-09: qty 1

## 2023-10-09 MED ORDER — ACETAMINOPHEN 325 MG PO TABS: 650.0000 mg | ORAL_TABLET | ORAL | Status: DC | PRN

## 2023-10-09 MED ORDER — LIDOCAINE 2% (20 MG/ML) 5 ML SYRINGE
INTRAMUSCULAR | Status: DC | PRN
  Administered 2023-10-09: 60 mg via INTRAVENOUS

## 2023-10-09 MED ORDER — SODIUM CHLORIDE 0.9% FLUSH: 3.0000 mL | Freq: Two times a day (BID) | INTRAVENOUS | Status: DC

## 2023-10-09 MED ORDER — ONDANSETRON HCL 4 MG/2ML IJ SOLN: 4.0000 mg | Freq: Four times a day (QID) | INTRAMUSCULAR | Status: DC | PRN

## 2023-10-09 MED ORDER — ROCURONIUM BROMIDE 10 MG/ML (PF) SYRINGE
PREFILLED_SYRINGE | INTRAVENOUS | Status: DC | PRN
  Administered 2023-10-09: 40 mg via INTRAVENOUS
  Administered 2023-10-09: 60 mg via INTRAVENOUS

## 2023-10-09 MED ORDER — ATROPINE SULFATE 1 MG/10ML IJ SOSY
PREFILLED_SYRINGE | INTRAMUSCULAR | Status: DC | PRN
Start: 2023-10-09 — End: 2023-10-09
  Administered 2023-10-09: 1 mg via INTRAVENOUS

## 2023-10-09 MED ORDER — PROPOFOL 500 MG/50ML IV EMUL
INTRAVENOUS | Status: DC | PRN
Start: 2023-10-09 — End: 2023-10-09
  Administered 2023-10-09: 100 ug/kg/min via INTRAVENOUS

## 2023-10-09 MED ORDER — SUGAMMADEX SODIUM 200 MG/2ML IV SOLN
INTRAVENOUS | Status: DC | PRN
  Administered 2023-10-09: 200 mg via INTRAVENOUS
  Administered 2023-10-09: 100 mg via INTRAVENOUS

## 2023-10-09 MED ORDER — SODIUM CHLORIDE 0.9% FLUSH: 3.0000 mL | INTRAVENOUS | Status: DC | PRN

## 2023-10-09 MED ORDER — PHENYLEPHRINE HCL-NACL 20-0.9 MG/250ML-% IV SOLN
INTRAVENOUS | Status: DC | PRN
Start: 2023-10-09 — End: 2023-10-09
  Administered 2023-10-09: 35 ug/min via INTRAVENOUS

## 2023-10-09 MED ORDER — HEPARIN (PORCINE) IN NACL 2000-0.9 UNIT/L-% IV SOLN
INTRAVENOUS | Status: DC | PRN
  Administered 2023-10-09: 1000 mL

## 2023-10-09 MED ORDER — SODIUM CHLORIDE 0.9 % IV SOLN: 250.0000 mL | INTRAVENOUS | Status: DC | PRN

## 2023-10-09 MED ORDER — FENTANYL CITRATE (PF) 100 MCG/2ML IJ SOLN
INTRAMUSCULAR | Status: DC | PRN
Start: 2023-10-09 — End: 2023-10-09
  Administered 2023-10-09: 50 ug via INTRAVENOUS

## 2023-10-09 MED ORDER — ATROPINE SULFATE 1 MG/10ML IJ SOSY
PREFILLED_SYRINGE | INTRAMUSCULAR | Status: AC
  Filled 2023-10-09: qty 10

## 2023-10-09 MED ORDER — HEPARIN SODIUM (PORCINE) 1000 UNIT/ML IJ SOLN
INTRAMUSCULAR | Status: DC | PRN
Start: 2023-10-09 — End: 2023-10-09
  Administered 2023-10-09: 15000 [IU] via INTRAVENOUS

## 2023-10-09 MED ORDER — HEPARIN (PORCINE) IN NACL 1000-0.9 UT/500ML-% IV SOLN
INTRAVENOUS | Status: DC | PRN
  Administered 2023-10-09: 500 mL

## 2023-10-09 MED ORDER — ONDANSETRON HCL 4 MG/2ML IJ SOLN
INTRAMUSCULAR | Status: DC | PRN
  Administered 2023-10-09: 4 mg via INTRAVENOUS

## 2023-10-09 MED ORDER — PROPOFOL 10 MG/ML IV BOLUS
INTRAVENOUS | Status: DC | PRN
  Administered 2023-10-09: 100 mg via INTRAVENOUS
  Administered 2023-10-09: 20 mg via INTRAVENOUS
  Administered 2023-10-09: 40 mg via INTRAVENOUS

## 2023-10-09 MED ORDER — DEXAMETHASONE SODIUM PHOSPHATE 10 MG/ML IJ SOLN
INTRAMUSCULAR | Status: DC | PRN
  Administered 2023-10-09: 5 mg via INTRAVENOUS

## 2023-10-09 MED ORDER — SODIUM CHLORIDE 0.9 % IV SOLN: INTRAVENOUS | Status: DC

## 2023-10-09 SURGICAL SUPPLY — 23 items
BAG SNAP BAND KOVER 36X36 (MISCELLANEOUS) IMPLANT
CABLE PFA RX CATH CONN (CABLE) IMPLANT
CATH 8FR REPROCESSED SOUNDSTAR (CATHETERS) ×1 IMPLANT
CATH 8FR SOUNDSTAR REPROCESSED (CATHETERS) IMPLANT
CATH FARAWAVE ABLATION 31 (CATHETERS) IMPLANT
CATH OCTARAY 2.0 F 3-3-3-3-3 (CATHETERS) IMPLANT
CATH WEB BI DIR CSDF CRV REPRO (CATHETERS) IMPLANT
CLOSURE PERCLOSE PROSTYLE (VASCULAR PRODUCTS) IMPLANT
COVER SWIFTLINK CONNECTOR (BAG) ×1 IMPLANT
DEVICE CLOSURE MYNXGRIP 6/7F (Vascular Products) IMPLANT
DILATOR VESSEL 38 20CM 16FR (INTRODUCER) IMPLANT
GUIDEWIRE INQWIRE 1.5J.035X260 (WIRE) IMPLANT
INQWIRE 1.5J .035X260CM (WIRE) ×1
MAT PREVALON FULL STRYKER (MISCELLANEOUS) IMPLANT
PACK EP LATEX FREE (CUSTOM PROCEDURE TRAY) ×1
PACK EP LF (CUSTOM PROCEDURE TRAY) ×1 IMPLANT
PAD DEFIB RADIO PHYSIO CONN (PAD) ×1 IMPLANT
PATCH CARTO3 (PAD) IMPLANT
SHEATH FARADRIVE STEERABLE (SHEATH) IMPLANT
SHEATH PINNACLE 8F 10CM (SHEATH) IMPLANT
SHEATH PINNACLE 9F 10CM (SHEATH) IMPLANT
SHEATH PROBE COVER 6X72 (BAG) IMPLANT
SHEATH WIRE KIT BAYLIS SL1 (KITS) IMPLANT

## 2023-10-09 NOTE — Progress Notes (Signed)
Patient and husband was given discharge instructions. Both verbalized understanding. 

## 2023-10-09 NOTE — Anesthesia Procedure Notes (Signed)
Procedure Name: Intubation Date/Time: 10/09/2023 8:47 AM  Performed by: Dorie Rank, CRNAPre-anesthesia Checklist: Patient identified, Emergency Drugs available, Suction available, Patient being monitored and Timeout performed Patient Re-evaluated:Patient Re-evaluated prior to induction Oxygen Delivery Method: Circle system utilized Preoxygenation: Pre-oxygenation with 100% oxygen Induction Type: IV induction Ventilation: Mask ventilation without difficulty Laryngoscope Size: Glidescope and 3 Grade View: Grade I Tube type: Oral Tube size: 7.5 mm Number of attempts: 1 Airway Equipment and Method: Stylet and Video-laryngoscopy Placement Confirmation: ETT inserted through vocal cords under direct vision, positive ETCO2 and breath sounds checked- equal and bilateral Secured at: 22 cm Tube secured with: Tape Dental Injury: Teeth and Oropharynx as per pre-operative assessment  Difficulty Due To: Difficulty was anticipated

## 2023-10-09 NOTE — H&P (Signed)
Electrophysiology Office Note:   Date:  09/07/2023  ID:  [name], [name] 10/03/1946, MRN [id number]   Primary Cardiologist: [name], MD Electrophysiologist: [name], MD       History of Present Illness:   [name] is a 77 y.o. female with h/o chronic diastolic heart failure, atrial flutter, pulmonary embolus, type 2 diabetes, mild obstructive sleep apnea, and asthma who is seen today for evaluation of atrial flutter at the request of Dr. Jenene Slicker.   Patient reportedly had an EKG done at her PCP office on 06/21/2023 which revealed atrial flutter.  She then wore an outpatient cardiac monitor which showed a 24% atrial fib/flutter burden.  She has been started on diltiazem. Amiodarone was prescribed but she did not start taking due to fear of side effects.  She is in normal sinus rhythm today.  She reports feeling much better today and when in normal sinus rhythm then when she is having episodes of atrial fibrillation.  During her episodes of a atrial fibrillation she reports palpitations, dizziness/ lightheadedness, fatigue and shortness of breath.  Today, patient reports doing relatively well. No missed doses of anti-coagulation.    Review of systems complete and found to be negative unless listed in HPI.    EP Information / Studies Reviewed:     EKG is ordered today. Personal review as below.   EKG Interpretation Date/Time:                  Thursday September 07 2023 11:39:48 EDT Ventricular Rate:         86 PR Interval:                 224 QRS Duration:             86 QT Interval:                 368 QTC Calculation:440 R Axis:                         86   Text Interpretation:Sinus rhythm with 1st degree A-V block When compared with ECG of 02-Aug-2023 16:45, No significant change was found Confirmed by [name] [phone]) on 09/07/2023 12:36:00 PM    ZIO monitor 07/24/2023: Coarse atrial fibrillation, possible flutter, and a postconversion pause of 4.6  seconds.   EKG 06/21/2023: Coarse atrial fibrillation.   Echocardiogram 06/14/2023: LVEF is 60 to 65%.  Grade 1 diastolic dysfunction. No significant valvular disease.  Left and right atrial size were reported as normal.   Nuclear stress test 11/03/2017: Reported as low risk with soft tissue attenuation artifacts.   Risk Assessment/Calculations:     CHA2DS2-VASc Score = 5     This indicates a 7.2% annual risk of stroke. The patient's score is based upon: CHF History: 1 HTN History: 0 Diabetes History: 1 Stroke History: 0 Vascular Disease History: 0 Age Score: 2 Gender Score: 1     Physical Exam:    Today's Vitals   10/09/23 0606  BP: (!) 145/72  Pulse: (!) 102  Resp: 18  Temp: (!) 97.5 F (36.4 C)  TempSrc: Temporal  SpO2: 93%  Weight: 93 kg  Height: 5\' 4"  (1.626 m)   Body mass index is 35.19 kg/m.  GEN: Well nourished, well developed in no acute distress NECK: No JVD; No carotid bruits CARDIAC: Regular rate and rhythm, soft systolic murmur, rubs, gallops RESPIRATORY:  Clear to auscultation without  rales, wheezing or rhonchi  ABDOMEN: Soft, non-tender, non-distended EXTREMITIES:  No edema; No deformity    ASSESSMENT AND PLAN:   Paroxysmal atrial fibrillation: She is symptomatic. EKG and ZIO monitor show coarse atrial fibrillation, possibly an atrial flutter as well.  She is currently on diltiazem and Xarelto for stroke prophylaxis.  Discussed extensively options for rhythm control including flecainide versus catheter ablation.  Risk and benefits of catheter ablation were discussed extensively and she voiced understanding. -Continue diltiazem for now.  -Discussed treatment options for AF including antiarrhythmic drug therapy and ablation. Discussed risks, recovery and likelihood of success with each treatment strategy. Risk, benefits, and alternatives to EP study and ablation for afib were discussed. These risks include but are not limited to stroke, bleeding, vascular  damage, tamponade, perforation, damage to the esophagus, lungs, phrenic nerve and other structures, pulmonary vein stenosis, worsening renal function, coronary vasospasm and death.  Discussed potential need for repeat ablation procedures and antiarrhythmic drugs after an initial ablation. The patient understands these risk and wishes to proceed with ablation today.  2.  Hypercoagulable state secondary to atrial fibrillation: Tolerating Xarelto without issue.  -Continue rivaroxaban 20 mg once daily.   3.  Chronic diastolic heart failure: Appears well compensated on exam today.  Given history of diastolic heart failure, she would benefit most from rhythm control.  -Continue current regimen of furosemide and follow-up with general cardiology.   4.  Obstructive sleep apnea: She had a home sleep study that was suggestive of sleep apnea.   -Formal sleep study is scheduled.

## 2023-10-09 NOTE — Transfer of Care (Signed)
Immediate Anesthesia Transfer of Care Note  Patient: [name]  Procedure(s) Performed: ATRIAL FIBRILLATION ABLATION  Patient Location: Cath Lab  Anesthesia Type:General  Level of Consciousness: sedated  Airway & Oxygen Therapy: Patient connected to face mask oxygen  Post-op Assessment: Post -op Vital signs reviewed and stable  Post vital signs: stable  Last Vitals:  Vitals Value Taken Time  BP    Temp    Pulse    Resp    SpO2      Last Pain:  Vitals:   10/09/23 0606  TempSrc: Temporal         Complications:  Encounter Notable Events  Notable Event Outcome Phase Comment  Difficult to intubate - expected  Intraprocedure Filed from anesthesia note documentation.

## 2023-10-09 NOTE — Discharge Instructions (Signed)

## 2023-10-09 NOTE — Anesthesia Preprocedure Evaluation (Addendum)
Anesthesia Evaluation  Patient identified by MRN, date of birth, ID band Patient awake    Reviewed: Allergy & Precautions, NPO status , Patient's Chart, lab work & pertinent test results  History of Anesthesia Complications (+) PONV and history of anesthetic complications  Airway Mallampati: IV  TM Distance: >3 FB Neck ROM: Full    Dental  (+) Dental Advisory Given, Teeth Intact   Pulmonary asthma , sleep apnea    breath sounds clear to auscultation       Cardiovascular hypertension, Pt. on medications (-) angina + DOE  (-) Past MI, (-) Cardiac Stents and (-) CABG + dysrhythmias Atrial Fibrillation  Rhythm:Irregular   1. Left ventricular ejection fraction, by estimation, is 60 to 65%. The  left ventricle has normal function. The left ventricle has no regional  wall motion abnormalities. There is mild left ventricular hypertrophy.  Left ventricular diastolic parameters  are consistent with Grade I diastolic dysfunction (impaired relaxation).   2. Right ventricular systolic function is normal. The right ventricular  size is normal. There is normal pulmonary artery systolic pressure. The  estimated right ventricular systolic pressure is 29.8 mmHg.   3. The mitral valve is grossly normal. Trivial mitral valve  regurgitation.   4. The aortic valve is tricuspid. Aortic valve regurgitation is not  visualized. Aortic valve mean gradient measures 6.0 mmHg.   5. The inferior vena cava is normal in size with greater than 50%  respiratory variability, suggesting right atrial pressure of 3 mmHg.     Neuro/Psych neg Seizures PSYCHIATRIC DISORDERS Anxiety      Neuromuscular disease    GI/Hepatic Neg liver ROS, hiatal hernia, PUD,GERD  Medicated and Controlled,,  Endo/Other  diabetes    Renal/GU CRFRenal diseaseLab Results      Component                Value               Date                      NA                       139                  10/09/2023                K                        4.1                 10/09/2023                CO2                      23                  10/09/2023                GLUCOSE                  137 (H)             10/09/2023                BUN                      22  10/09/2023                CREATININE               1.26 (H)            10/09/2023                CALCIUM                  9.8                 10/09/2023                EGFR                     47 (L)              05/09/2023                GFRNONAA                 44 (L)              10/09/2023                Musculoskeletal  (+) Arthritis ,    Abdominal   Peds  Hematology  (+) Blood dyscrasia Lab Results      Component                Value               Date                      WBC                      8.6                 09/20/2023                HGB                      13.8                09/20/2023                HCT                      43.0                09/20/2023                MCV                      95                  09/20/2023                PLT                      304                 09/20/2023            xarelto   Anesthesia Other Findings   Reproductive/Obstetrics                              Anesthesia Physical Anesthesia  Plan  ASA: 3  Anesthesia Plan: General   Post-op Pain Management: Minimal or no pain anticipated   Induction: Intravenous  PONV Risk Score and Plan: 4 or greater and Ondansetron, Dexamethasone, Propofol infusion and TIVA  Airway Management Planned: Oral ETT  Additional Equipment: None  Intra-op Plan:   Post-operative Plan: Extubation in OR  Informed Consent: I have reviewed the patients History and Physical, chart, labs and discussed the procedure including the risks, benefits and alternatives for the proposed anesthesia with the patient or authorized representative who has indicated his/her understanding and  acceptance.     Dental advisory given  Plan Discussed with: CRNA  Anesthesia Plan Comments:          Anesthesia Quick Evaluation

## 2023-10-10 ENCOUNTER — Encounter (HOSPITAL_COMMUNITY): Payer: Self-pay | Admitting: Cardiology

## 2023-10-12 NOTE — Anesthesia Postprocedure Evaluation (Signed)
Anesthesia Post Note  Patient: [name]  Procedure(s) Performed: ATRIAL FIBRILLATION ABLATION     Patient location during evaluation: Cath Lab Anesthesia Type: General Level of consciousness: awake and alert Pain management: pain level controlled Vital Signs Assessment: post-procedure vital signs reviewed and stable Respiratory status: spontaneous breathing, nonlabored ventilation and respiratory function stable Cardiovascular status: blood pressure returned to baseline and stable Postop Assessment: no apparent nausea or vomiting Anesthetic complications: yes   Encounter Notable Events  Notable Event Outcome Phase Comment  Difficult to intubate - expected  Intraprocedure Filed from anesthesia note documentation.    Last Vitals:  Vitals:   10/09/23 1400 10/09/23 1500  BP: (!) 141/69 (!) 153/55  Pulse: 81 88  Resp: (!) 21 (!) 21  Temp:    SpO2: 95% 92%    Last Pain:  Vitals:   10/09/23 1101  TempSrc: Axillary  PainSc: 3                  Aitanna Haubner

## 2023-10-13 DIAGNOSIS — J069 Acute upper respiratory infection, unspecified: Secondary | ICD-10-CM | POA: Diagnosis not present

## 2023-10-13 DIAGNOSIS — E6609 Other obesity due to excess calories: Secondary | ICD-10-CM | POA: Diagnosis not present

## 2023-10-13 DIAGNOSIS — Z6837 Body mass index (BMI) 37.0-37.9, adult: Secondary | ICD-10-CM | POA: Diagnosis not present

## 2023-10-17 ENCOUNTER — Telehealth: Payer: Self-pay | Admitting: Internal Medicine

## 2023-10-17 NOTE — Telephone Encounter (Signed)
Patient stated she got the results of her sleep study and now wants to get her sleep equipment.  Patient wants a call back on next steps.    Patient stated she is also following up on getting orders for compression stocking sent to [facility] - [place], [place] - [address]

## 2023-10-18 MED ORDER — MEDICAL COMPRESSION STOCKINGS MISC: 1.0000 | 0 refills | Status: DC

## 2023-10-18 NOTE — Telephone Encounter (Signed)
Order for compression stockings was sent to [facility] on 09/01/2023.   Resent compression stocking order to pharmacy per pt's request. Pt had no further questions or concerns at this time.

## 2023-10-19 ENCOUNTER — Institutional Professional Consult (permissible substitution): Payer: HMO | Admitting: Cardiovascular Disease

## 2023-10-30 ENCOUNTER — Telehealth: Payer: Self-pay | Admitting: Internal Medicine

## 2023-10-30 NOTE — Telephone Encounter (Signed)
Patient stated she completed her sleep study on 9/19 at [facility] and received the results, however she has not heard anything about getting her sleep equipment.  Patient stated she has called 4 times and wants call back to discuss next steps.

## 2023-11-01 ENCOUNTER — Telehealth: Payer: Self-pay | Admitting: Cardiology

## 2023-11-01 NOTE — Telephone Encounter (Signed)
When we last spoke patient stated " she wants to talk to her cardiologist and get back to [place]." I have reached out to her to get her cpap ordered.  Upon patient request DME selection is [place]. Patient understands he will be contacted by [facility] to set up his cpap. Patient understands to call if [facility] does not contact him with new setup in a timely manner. Patient understands they will be called once confirmation has been received from [facility] that they have received their new machine to schedule 10 week follow up appointment.   [facility] notified of new cpap order  Please add to airview Patient was grateful for the call and thanked me.

## 2023-11-01 NOTE — Telephone Encounter (Signed)
Patient states she still hasn't heard anything about her CPAP equipement says it been 4 weeks? Can someone reach out to her please

## 2023-11-01 NOTE — Telephone Encounter (Signed)
When we last spoke patient stated " she wants to talk to her cardiologist and get back to [place]." I have reached out to her to get her cpap ordered.

## 2023-11-01 NOTE — Telephone Encounter (Signed)
She is wanting a update on her CPAP machine following her sleep study .. can someone please reach out to her. She called numerous times she said.

## 2023-11-06 ENCOUNTER — Ambulatory Visit (HOSPITAL_COMMUNITY)
Admission: RE | Admit: 2023-11-06 | Discharge: 2023-11-06 | Disposition: A | Discharge status: Home / Self Care | Payer: HMO | Source: Ambulatory Visit | Attending: Internal Medicine | Admitting: Internal Medicine

## 2023-11-06 VITALS — BP 142/72 | HR 85 | Ht 64.0 in | Wt 204.2 lb

## 2023-11-06 DIAGNOSIS — E119 Type 2 diabetes mellitus without complications: Secondary | ICD-10-CM | POA: Insufficient documentation

## 2023-11-06 DIAGNOSIS — R600 Localized edema: Secondary | ICD-10-CM | POA: Insufficient documentation

## 2023-11-06 DIAGNOSIS — I5032 Chronic diastolic (congestive) heart failure: Secondary | ICD-10-CM | POA: Insufficient documentation

## 2023-11-06 DIAGNOSIS — D6869 Other thrombophilia: Secondary | ICD-10-CM | POA: Insufficient documentation

## 2023-11-06 DIAGNOSIS — Z79899 Other long term (current) drug therapy: Secondary | ICD-10-CM | POA: Insufficient documentation

## 2023-11-06 DIAGNOSIS — G4733 Obstructive sleep apnea (adult) (pediatric): Secondary | ICD-10-CM | POA: Insufficient documentation

## 2023-11-06 DIAGNOSIS — I4891 Unspecified atrial fibrillation: Secondary | ICD-10-CM

## 2023-11-06 DIAGNOSIS — Z7984 Long term (current) use of oral hypoglycemic drugs: Secondary | ICD-10-CM | POA: Diagnosis not present

## 2023-11-06 DIAGNOSIS — I480 Paroxysmal atrial fibrillation: Secondary | ICD-10-CM | POA: Diagnosis not present

## 2023-11-06 DIAGNOSIS — I110 Hypertensive heart disease with heart failure: Secondary | ICD-10-CM | POA: Diagnosis not present

## 2023-11-06 DIAGNOSIS — Z7901 Long term (current) use of anticoagulants: Secondary | ICD-10-CM | POA: Diagnosis not present

## 2023-11-06 NOTE — Progress Notes (Addendum)
Primary Care Physician: Hailey Found, MD Primary Cardiologist: [name], MD Electrophysiologist: [name], MD     Referring Physician: Dr. [name] is a 77 y.o. female with a history of HTN, OSA, HFpEF, T2DM, and paroxysmal atrial fibrillation who presents for consultation in the [facility]. S/p Afib ablation on 10/09/23 by Dr. Jimmey Ralph. Patient is on Xarelto for a CHADS2VASC score of 5.  On evaluation today, she is currently in NSR. No episodes of Afib since ablation. No chest pain, SOB, or trouble swallowing. Leg sites healed without issue. No missed doses of anticoagulant. She notes ongoing swelling in bilateral lower legs and was currently titrated on lasix to now be taking 20 mg TID. She tried to wear the compression hose she was given but it cuts off circulation to her legs and hurts so she can't wear it. She was told it will be several months before she can get a CPAP machine.   Today, she denies symptoms of orthopnea, PND, dizziness, presyncope, syncope, snoring, daytime somnolence, bleeding, or neurologic sequela. The patient is tolerating medications without difficulties and is otherwise without complaint today.    Atrial Fibrillation Risk Factors:  she does have symptoms or diagnosis of sleep apnea. She is scheduled for formal sleep study.   she has a BMI of Body mass index is 35.05 kg/m.Marland Kitchen Filed Weights   11/06/23 1058  Weight: 92.6 kg    Current Outpatient Medications  Medication Sig Dispense Refill   acetaminophen (TYLENOL) 500 MG tablet Take 500 mg by mouth as needed for moderate pain (pain score 4-6) or headache.     albuterol (VENTOLIN HFA) 108 (90 Base) MCG/ACT inhaler Inhale 1-2 puffs into the lungs every 4 (four) hours as needed for wheezing or shortness of breath.     Cholecalciferol (VITAMIN D) 2000 UNITS CAPS Take 2,000 Units by mouth daily.     cyanocobalamin (VITAMIN B12) 1000 MCG tablet Take  1,000 mcg by mouth daily.     D-MANNOSE PO Take 2 tablets by mouth daily.     diazepam (VALIUM) 5 MG tablet Take 5 mg by mouth every 8 (eight) hours as needed for anxiety.     DILT-XR 180 MG 24 hr capsule Take 180 mg by mouth at bedtime.     Elastic Bandages & Supports (MEDICAL COMPRESSION STOCKINGS) MISC 1 each by Does not apply route as directed. Knew High Medium Pressure Compression stockings Dx: bilateral lower extremity edema 1 each 0   ferrous sulfate 325 (65 FE) MG tablet Take 1 tablet (325 mg total) by mouth daily with breakfast.     furosemide (LASIX) 20 MG tablet Take 1 tablet (20 mg total) by mouth 2 (two) times daily. May take an additional 20 mg tablet daily as needed for shortness of breath (Patient taking differently: Take 20 mg by mouth 3 (three) times daily.) 180 tablet 1   gabapentin (NEURONTIN) 300 MG capsule Take 300 mg by mouth at bedtime.     KLOR-CON M20 20 MEQ tablet TAKE 1 TABLET BY MOUTH EVERY DAY 90 tablet 1   metFORMIN (GLUCOPHAGE) 500 MG tablet Take 500 mg by mouth 2 (two) times daily with a meal.      montelukast (SINGULAIR) 10 MG tablet Take 10 mg by mouth every morning.     omeprazole (PRILOSEC) 20 MG capsule Take 20 mg by mouth 2 (two) times daily before a meal.     PEPPERMINT OIL PO  Take 1 capsule by mouth daily. Intestinal Defense With Ginger and Fennel     Probiotic Product (PROBIOTIC DAILY PO) Take 1 capsule by mouth daily.     rivaroxaban (XARELTO) 20 MG TABS tablet Take 20 mg by mouth daily with supper.     Turmeric 450 MG CAPS Take 450 mg by mouth daily.     No current facility-administered medications for this encounter.    Atrial Fibrillation Management history:  Previous antiarrhythmic drugs: none Previous cardioversions: none Previous ablations: 10/09/23 Anticoagulation history: Xarelto   ROS- All systems are reviewed and negative except as per the HPI above.  Physical Exam: BP (!) 142/72   Pulse 85   Ht 5\' 4"  (1.626 m)   Wt 92.6 kg   BMI  35.05 kg/m   GEN: Well nourished, well developed in no acute distress NECK: No JVD; No carotid bruits CARDIAC: Regular rate and rhythm, no murmurs, rubs, gallops RESPIRATORY:  Clear to auscultation without rales, wheezing or rhonchi  ABDOMEN: Soft, non-tender, non-distended EXTREMITIES:  2+ pitting edema LE bilaterally  EKG today demonstrates  Vent. rate 85 BPM PR interval 208 ms QRS duration 84 ms QT/QTcB 370/440 ms P-R-T axes 42 115 76 Normal sinus rhythm Right axis deviation Abnormal ECG When compared with ECG of 09-Oct-2023 10:42, PREVIOUS ECG IS PRESENT  Echo 06/14/23 demonstrated  1. Left ventricular ejection fraction, by estimation, is 60 to 65%. The  left ventricle has normal function. The left ventricle has no regional  wall motion abnormalities. There is mild left ventricular hypertrophy.  Left ventricular diastolic parameters  are consistent with Grade I diastolic dysfunction (impaired relaxation).   2. Right ventricular systolic function is normal. The right ventricular  size is normal. There is normal pulmonary artery systolic pressure. The  estimated right ventricular systolic pressure is 29.8 mmHg.   3. The mitral valve is grossly normal. Trivial mitral valve  regurgitation.   4. The aortic valve is tricuspid. Aortic valve regurgitation is not  visualized. Aortic valve mean gradient measures 6.0 mmHg.   5. The inferior vena cava is normal in size with greater than 50%  respiratory variability, suggesting right atrial pressure of 3 mmHg.   ASSESSMENT & PLAN CHA2DS2-VASc Score = 5  The patient's score is based upon: CHF History: 1 HTN History: 0 Diabetes History: 1 Stroke History: 0 Vascular Disease History: 0 Age Score: 2 Gender Score: 1       ASSESSMENT AND PLAN: Paroxysmal Atrial Fibrillation (ICD10:  I48.0) The patient's CHA2DS2-VASc score is 5, indicating a 7.2% annual risk of stroke.   S/p Afib ablation on 10/09/23 by Dr. Jimmey Ralph.  She is  currently in NSR.  Continue diltiazem 180 mg daily. Continue Xarelto 20 mg daily.    Secondary Hypercoagulable State (ICD10:  D68.69) The patient is at significant risk for stroke/thromboembolism based upon her CHA2DS2-VASc Score of 5.  Continue Rivaroxaban (Xarelto).  No missed doses.   Bilateral lower extremity edema Patient has recently been taking lasix 20 mg TID and her legs appear still to have 2+ pitting edema. She is also taking potassium 20 mEq daily. She is unable to wear compression hose given to her. Recommended she please contact her cardiologist office for assistant with medication titration. Not sure if she needs a new medication versus increasing lasix dose since 60 mg appears ineffective. She continues to weigh herself daily and does not have SOB.   OSA Patient is frustrated because she has to wait several months for a machine.  Follow up as scheduled with Dr. Jimmey Ralph.   Hailey Bells, PA-C  Af[facility] Clinic [facility] [address] [place], [place] [address] [phone]

## 2023-11-06 NOTE — Patient Instructions (Signed)
Please contact your cardiologist office regarding your recent lasix increase and ongoing swelling in legs.

## 2023-11-07 NOTE — Telephone Encounter (Signed)
Contacted [employer] Records Department to get clarification on which notes were needed regarding the ordering of itamar study. Spoke with Selena Batten who says that in order for insurance to cover CPAP supplies, they need office notes/records including a copy of the sleep study result, office notes before sleep study was ordered and notes showing where the sleep study was ordered, (06/21/2023 telephone encounter). Kim informed that the sleep study wasn't ordered during 05/03/2023 office visit but was ordered during a phone encounter on 06/21/2023. Will forward to Oneida Alar to submit these records.

## 2023-11-07 NOTE — Telephone Encounter (Signed)
Reached out to patient to let her know all papers have been faxed to her dme.

## 2023-11-17 DIAGNOSIS — M1711 Unilateral primary osteoarthritis, right knee: Secondary | ICD-10-CM | POA: Diagnosis not present

## 2023-11-22 ENCOUNTER — Other Ambulatory Visit: Payer: Self-pay | Admitting: Internal Medicine

## 2023-11-27 ENCOUNTER — Telehealth: Payer: Self-pay | Admitting: Internal Medicine

## 2023-11-27 DIAGNOSIS — M1711 Unilateral primary osteoarthritis, right knee: Secondary | ICD-10-CM | POA: Diagnosis not present

## 2023-11-27 NOTE — Telephone Encounter (Signed)
   Pre-operative Risk Assessment    Patient Name: [name]  DOB: 08-07-1946 MRN: [id number]      Request for Surgical Clearance    Procedure:   Right total knee replacement  Date of Surgery:  Clearance TBD                                 Surgeon:  Eulas Post, M.D. Surgeon's Group or Practice Name:  [facility] Phone number:  [phone] Fax number:  [phone]   Type of Clearance Requested:   - Medical  - Pharmacy:  Hold Rivaroxaban (Xarelto)     Type of Anesthesia:  Spinal   Additional requests/questions:    Lamont Dowdy   11/27/2023, 12:44 PM

## 2023-11-27 NOTE — Telephone Encounter (Signed)
Pharmacy please advise on holding Xarelto prior to right total knee replacement scheduled for TBD. Thank you.

## 2023-11-28 DIAGNOSIS — M1711 Unilateral primary osteoarthritis, right knee: Secondary | ICD-10-CM | POA: Diagnosis not present

## 2023-11-29 ENCOUNTER — Telehealth: Payer: Self-pay

## 2023-11-29 DIAGNOSIS — G4733 Obstructive sleep apnea (adult) (pediatric): Secondary | ICD-10-CM | POA: Diagnosis not present

## 2023-11-29 NOTE — Telephone Encounter (Signed)
  Patient Consent for Virtual Visit         [name] has provided verbal consent on 11/29/2023 for a virtual visit (video or telephone).   CONSENT FOR VIRTUAL VISIT FOR:  [name]  By participating in this virtual visit I agree to the following:  I hereby voluntarily request, consent and authorize [facility] and its employed or contracted physicians, physician assistants, nurse practitioners or other licensed health care professionals (the Practitioner), to provide me with telemedicine health care services (the "Services") as deemed necessary by the treating Practitioner. I acknowledge and consent to receive the Services by the Practitioner via telemedicine. I understand that the telemedicine visit will involve communicating with the Practitioner through live audiovisual communication technology and the disclosure of certain medical information by electronic transmission. I acknowledge that I have been given the opportunity to request an in-person assessment or other available alternative prior to the telemedicine visit and am voluntarily participating in the telemedicine visit.  I understand that I have the right to withhold or withdraw my consent to the use of telemedicine in the course of my care at any time, without affecting my right to future care or treatment, and that the Practitioner or I may terminate the telemedicine visit at any time. I understand that I have the right to inspect all information obtained and/or recorded in the course of the telemedicine visit and may receive copies of available information for a reasonable fee.  I understand that some of the potential risks of receiving the Services via telemedicine include:  Delay or interruption in medical evaluation due to technological equipment failure or disruption; Information transmitted may not be sufficient (e.g. poor resolution of images) to allow for appropriate medical decision making by the  Practitioner; and/or  In rare instances, security protocols could fail, causing a breach of personal health information.  Furthermore, I acknowledge that it is my responsibility to provide information about my medical history, conditions and care that is complete and accurate to the best of my ability. I acknowledge that Practitioner's advice, recommendations, and/or decision may be based on factors not within their control, such as incomplete or inaccurate data provided by me or distortions of diagnostic images or specimens that may result from electronic transmissions. I understand that the practice of medicine is not an exact science and that Practitioner makes no warranties or guarantees regarding treatment outcomes. I acknowledge that a copy of this consent can be made available to me via my patient portal [facility] MyChart), or I can request a printed copy by calling the office of [facility].    I understand that my insurance will be billed for this visit.   I have read or had this consent read to me. I understand the contents of this consent, which adequately explains the benefits and risks of the Services being provided via telemedicine.  I have been provided ample opportunity to ask questions regarding this consent and the Services and have had my questions answered to my satisfaction. I give my informed consent for the services to be provided through the use of telemedicine in my medical care

## 2023-11-29 NOTE — Telephone Encounter (Signed)
Patient with diagnosis of afib on Xarelto for anticoagulation.    Procedure: Right total knee replacement  Date of procedure: TBD   CHA2DS2-VASc Score = 5   This indicates a 7.2% annual risk of stroke. The patient's score is based upon: CHF History: 1 HTN History: 0 Diabetes History: 1 Stroke History: 0 Vascular Disease History: 0 Age Score: 2 Gender Score: 1      CrCl 41 ml/min Platelet count 304  Patient is s/p Afib ablation on 10/09/23. She needs to remain on uninterrupted anticoagulation for 90 days post ablation. Therefore patient she not hold anticoagulation prior to 01/07/24 and procedure should not be scheduled prior to 01/11/24.   Per office protocol, patient can hold Xarelto for 3 days prior to procedure as long as procedure is schedule on or after 01/11/24.  **This guidance is not considered finalized until pre-operative APP has relayed final recommendations.**

## 2023-11-29 NOTE — Telephone Encounter (Signed)
   Name: [name]  DOB: 30-Aug-1946  MRN: [id number]  Primary Cardiologist: [name], MD   Preoperative team, please contact this patient and set up a phone call appointment for further preoperative risk assessment. Please obtain consent and complete medication review. Thank you for your help.  I confirm that guidance regarding antiplatelet and oral anticoagulation therapy has been completed and, if necessary, noted below.  Per office protocol, patient can hold Xarelto for 3 days prior to procedure as long as procedure is schedule on or after 01/11/24.   I also confirmed the patient resides in the state of [place]. As per [facility] [employer] Board telemedicine laws, the patient must reside in the state in which the provider is licensed.   Ronney Asters, NP 11/29/2023, 8:58 AM [facility]

## 2023-11-29 NOTE — Telephone Encounter (Signed)
Patient is agreeable with telehealth appointment.

## 2023-11-30 DIAGNOSIS — L820 Inflamed seborrheic keratosis: Secondary | ICD-10-CM | POA: Diagnosis not present

## 2023-11-30 DIAGNOSIS — X32XXXD Exposure to sunlight, subsequent encounter: Secondary | ICD-10-CM | POA: Diagnosis not present

## 2023-11-30 DIAGNOSIS — L570 Actinic keratosis: Secondary | ICD-10-CM | POA: Diagnosis not present

## 2023-12-07 DIAGNOSIS — G4733 Obstructive sleep apnea (adult) (pediatric): Secondary | ICD-10-CM | POA: Diagnosis not present

## 2023-12-11 DIAGNOSIS — I480 Paroxysmal atrial fibrillation: Secondary | ICD-10-CM | POA: Diagnosis not present

## 2023-12-11 DIAGNOSIS — I10 Essential (primary) hypertension: Secondary | ICD-10-CM | POA: Diagnosis not present

## 2023-12-11 DIAGNOSIS — E6609 Other obesity due to excess calories: Secondary | ICD-10-CM | POA: Diagnosis not present

## 2023-12-11 DIAGNOSIS — Z01818 Encounter for other preprocedural examination: Secondary | ICD-10-CM | POA: Diagnosis not present

## 2023-12-11 DIAGNOSIS — E1159 Type 2 diabetes mellitus with other circulatory complications: Secondary | ICD-10-CM | POA: Diagnosis not present

## 2023-12-11 DIAGNOSIS — Z6836 Body mass index (BMI) 36.0-36.9, adult: Secondary | ICD-10-CM | POA: Diagnosis not present

## 2023-12-11 DIAGNOSIS — K589 Irritable bowel syndrome without diarrhea: Secondary | ICD-10-CM | POA: Diagnosis not present

## 2023-12-11 DIAGNOSIS — F419 Anxiety disorder, unspecified: Secondary | ICD-10-CM | POA: Diagnosis not present

## 2023-12-11 DIAGNOSIS — J4530 Mild persistent asthma, uncomplicated: Secondary | ICD-10-CM | POA: Diagnosis not present

## 2023-12-11 DIAGNOSIS — E782 Mixed hyperlipidemia: Secondary | ICD-10-CM | POA: Diagnosis not present

## 2023-12-11 DIAGNOSIS — N202 Calculus of kidney with calculus of ureter: Secondary | ICD-10-CM | POA: Diagnosis not present

## 2023-12-11 DIAGNOSIS — I2699 Other pulmonary embolism without acute cor pulmonale: Secondary | ICD-10-CM | POA: Diagnosis not present

## 2023-12-25 DIAGNOSIS — Z6836 Body mass index (BMI) 36.0-36.9, adult: Secondary | ICD-10-CM | POA: Diagnosis not present

## 2023-12-25 DIAGNOSIS — I10 Essential (primary) hypertension: Secondary | ICD-10-CM | POA: Diagnosis not present

## 2023-12-25 DIAGNOSIS — E1159 Type 2 diabetes mellitus with other circulatory complications: Secondary | ICD-10-CM | POA: Diagnosis not present

## 2023-12-25 DIAGNOSIS — E6609 Other obesity due to excess calories: Secondary | ICD-10-CM | POA: Diagnosis not present

## 2023-12-25 DIAGNOSIS — I480 Paroxysmal atrial fibrillation: Secondary | ICD-10-CM | POA: Diagnosis not present

## 2023-12-29 DIAGNOSIS — M1711 Unilateral primary osteoarthritis, right knee: Secondary | ICD-10-CM | POA: Diagnosis not present

## 2023-12-30 DIAGNOSIS — G4733 Obstructive sleep apnea (adult) (pediatric): Secondary | ICD-10-CM | POA: Diagnosis not present

## 2024-01-01 ENCOUNTER — Ambulatory Visit: Payer: HMO | Attending: Family Medicine | Admitting: Nurse Practitioner

## 2024-01-01 ENCOUNTER — Encounter: Payer: Self-pay | Admitting: Nurse Practitioner

## 2024-01-01 DIAGNOSIS — Z01810 Encounter for preprocedural cardiovascular examination: Secondary | ICD-10-CM | POA: Diagnosis not present

## 2024-01-01 NOTE — Progress Notes (Signed)
 Virtual Visit via Telephone Note   Because of [name] co-morbid illnesses, she is at least at moderate risk for complications without adequate follow up.  This format is felt to be most appropriate for this patient at this time.  The patient did not have access to video technology/had technical difficulties with video requiring transitioning to audio format only (telephone).  All issues noted in this document were discussed and addressed.  No physical exam could be performed with this format.  Please refer to the patient's chart for her consent to telehealth for [facility].  Evaluation Performed:  Preoperative cardiovascular risk assessment _____________   Date:  01/01/2024   Patient ID:  [name], DOB 21-May-1946, MRN [id number] Patient Location:  Home Provider location:   Office  Primary Care Provider:  Marvine Rush, MD Primary Cardiologist:  Vishnu P Mallipeddi, MD  Chief Complaint / Patient Profile   78 y.o. y/o female with a h/o PAF on chronic anticoagulation s/p ablation on 10/09/23, hypertension, OSA, HFpEF, T2DM who is pending right total knee replacement with Dr. Josefina on date TBD and presents today for telephonic preoperative cardiovascular risk assessment.  History of Present Illness    [name] is a 78 y.o. female who presents via audio/video conferencing for a telehealth visit today.  Pt was last seen in cardiology clinic on 11/06/23 by Fairy Heinrich, PA.  At that time [name] was doing well.  The patient is now pending procedure as outlined above. Since her last visit, she  denies chest pain, shortness of breath, lower extremity edema, fatigue, palpitations, melena, hematuria, hemoptysis, diaphoresis, weakness, presyncope, syncope, orthopnea, and PND. She admits that she has not been very active since having ablation in October 2024. No evidence of return of a fib to her awareness. She is able to achieve > 4 METS activity with routine  care and activities of daily living and is not having any concerning cardiac symptoms.   Past Medical History    Past Medical History:  Diagnosis Date   Anxiety 12/29/2022   Arthritis    Asthma    Carpal tunnel syndrome    Gastric ulcer    GERD (gastroesophageal reflux disease)    H/O hiatal hernia    Heart murmur    since birth   History of heart murmur in childhood    History of kidney stones    Iron deficiency anemia    Neuropathy    Following neck surgery.   Paralysis (HCC)    PONV (postoperative nausea and vomiting)    Pulmonary embolus [facility])    November 2020   Type 2 diabetes mellitus [facility])    Past Surgical History:  Procedure Laterality Date   APPENDECTOMY     ATRIAL FIBRILLATION ABLATION N/A 10/09/2023   Procedure: ATRIAL FIBRILLATION ABLATION;  Surgeon: Kennyth Chew, MD;  Location: [facility] INVASIVE CV LAB;  Service: Cardiovascular;  Laterality: N/A;   BIOPSY  06/01/2022   Procedure: BIOPSY;  Surgeon: Golda Claudis PENNER, MD;  Location: [facility] ENDO SU[facility];  Service: Endoscopy;;   CHOLECYSTECTOMY     COLONOSCOPY     COLONOSCOPY N/A 09/26/2013   Procedure: COLONOSCOPY;  Surgeon: Claudis PENNER Golda, MD;  Location: [facility];  Service: Endoscopy;  Laterality: N/A;  1030-rescheduled to 12:00pm Ann notified pt   COLONOSCOPY N/A 03/27/2015   Procedure: COLONOSCOPY;  Surgeon: Claudis PENNER Golda, MD;  Location: [facility];  Service: Endoscopy;  Laterality: N/A;  11:15 - moved to  8:25 - Ann to notify pt   COLONOSCOPY WITH PROPOFOL  N/A 06/01/2022   Procedure: COLONOSCOPY WITH PROPOFOL ;  Surgeon: Golda Claudis PENNER, MD;  Location: [facility];  Service: Endoscopy;  Laterality: N/A;  1005 ASA 2   CYSTOSCOPY/URETEROSCOPY/HOLMIUM LASER/STENT PLACEMENT Left 12/23/2022   Procedure: CYSTOSCOPY/ LEFT RETROGRADE/ URETEROSCOPY/HOLMIUM LASER/STENT PLACEMENT;  Surgeon: Nieves Cough, MD;  Location: [facility] ORS;  Service: Urology;  Laterality: Left;   ESOPHAGOGASTRODUODENOSCOPY  12/08/2011   Procedure:  ESOPHAGOGASTRODUODENOSCOPY (EGD);  Surgeon: Claudis PENNER Golda, MD;  [facility]: [facility];  Service: Endoscopy;  Laterality: N/A;  3:00    ESOPHAGOGASTRODUODENOSCOPY N/A 03/27/2015   Procedure: ESOPHAGOGASTRODUODENOSCOPY (EGD);  Surgeon: Claudis PENNER Golda, MD;  [facility];  Service: Endoscopy;  Laterality: N/A;   EYE SURGERY Bilateral    cataract surgery   GIVENS CAPSULE STUDY N/A 11/04/2015   Procedure: GIVENS CAPSULE STUDY;  Surgeon: Claudis PENNER Golda, MD;  [facility]: [facility];  Service: Endoscopy;  Laterality: N/A;   Hysterscopy     KNEE ARTHROSCOPY     Left   NECK SURGERY     x 2    TONSILLECTOMY     TOTAL KNEE ARTHROPLASTY  07/11/2012   Procedure: TOTAL KNEE ARTHROPLASTY;  Surgeon: Toribio JULIANNA Chancy, MD;  Location: [facility] OR;  Service: Orthopedics;  Laterality: Left;  left total knee arthroplasty   UPPER GASTROINTESTINAL ENDOSCOPY     YAG LASER APPLICATION Right 02/21/2017   Procedure: YAG LASER APPLICATION;  Surgeon: Oneil Platts, MD;  Location: [facility] ORS;  Service: Ophthalmology;  Laterality: Right;    Allergies  Allergies  Allergen Reactions   Nsaids Other (See Comments)    Caused Ulcers.   Cephalexin     unknown   Darvocet [Propoxyphene N-Acetaminophen ] Nausea Only   Percocet [Oxycodone -Acetaminophen ] Nausea Only   Penicillins Rash and Other (See Comments)    Has patient had a PCN reaction causing immediate rash, facial/tongue/throat swelling, SOB or lightheadedness with hypotension: Yes Has patient had a PCN reaction causing severe rash involving mucus membranes or skin necrosis: No Has patient had a PCN reaction that required hospitalization No Has patient had a PCN reaction occurring within the last 10 years: Yes If all of the above answers are NO, then may proceed with Cephalosporin use.     Home Medications    Prior to Admission medications   Medication Sig Start Date End Date Taking? Authorizing Provider  acetaminophen  (TYLENOL ) 500 MG tablet Take 500 mg by  mouth as needed for moderate pain (pain score 4-6) or headache.    [provider]  albuterol  (VENTOLIN  HFA) 108 (90 Base) MCG/ACT inhaler Inhale 1-2 puffs into the lungs every 4 (four) hours as needed for wheezing or shortness of breath. 09/05/23   [provider]  Cholecalciferol  (VITAMIN D ) 2000 UNITS CAPS Take 2,000 Units by mouth daily.    [provider]  cyanocobalamin (VITAMIN B12) 1000 MCG tablet Take 1,000 mcg by mouth daily.    [provider]  D-MANNOSE PO Take 2 tablets by mouth daily.    [provider]  diazepam  (VALIUM ) 5 MG tablet Take 5 mg by mouth every 8 (eight) hours as needed for anxiety.    [provider]  DILT-XR 180 MG 24 hr capsule Take 180 mg by mouth at bedtime. 03/08/23   [provider]  Elastic Bandages & Supports (MEDICAL COMPRESSION STOCKINGS) MISC 1 each by Does not apply route as directed. Knew High Medium Pressure Compression stockings Dx: bilateral lower  extremity edema 10/18/23   Mallipeddi, Vishnu P, MD  ferrous sulfate  325 (65 FE) MG tablet Take 1 tablet (325 mg total) by mouth daily with breakfast. 10/25/16   Rehman, Claudis PENNER, MD  furosemide  (LASIX ) 20 MG tablet Take 1 tablet (20 mg total) by mouth 2 (two) times daily. May take an additional 20 mg tablet daily as needed for shortness of breath Patient taking differently: Take 20 mg by mouth 3 (three) times daily. 08/29/23   Mallipeddi, Vishnu P, MD  gabapentin  (NEURONTIN ) 300 MG capsule Take 300 mg by mouth at bedtime.    [provider]  KLOR-CON  M20 20 MEQ tablet TAKE 1 TABLET BY MOUTH EVERY DAY 05/04/23   Mallipeddi, Vishnu P, MD  metFORMIN (GLUCOPHAGE) 500 MG tablet Take 500 mg by mouth 2 (two) times daily with a meal.     [provider]  montelukast  (SINGULAIR ) 10 MG tablet Take 10 mg by mouth every morning.    [provider]  omeprazole  (PRILOSEC) 20 MG capsule Take 20 mg by mouth 2 (two) times daily before a meal.     [provider]  PEPPERMINT OIL PO Take 1 capsule by mouth daily. Intestinal Defense With Ginger and Fennel    [provider]  Probiotic Product (PROBIOTIC DAILY PO) Take 1 capsule by mouth daily.    [provider]  rivaroxaban  (XARELTO ) 20 MG TABS tablet Take 20 mg by mouth daily with supper.    [provider]  Turmeric 450 MG CAPS Take 450 mg by mouth daily.    [provider]    Physical Exam    Vital Signs:  [name] does not have vital signs available for review today.  Given telephonic nature of communication, physical exam is limited. AAOx3. NAD. Normal affect.  Speech and respirations are unlabored.  Accessory Clinical Findings    None  Assessment & Plan    1.  Preoperative Cardiovascular Risk Assessment: According to the Revised Cardiac Risk Index (RCRI), her Perioperative Risk of Major Cardiac Event is (%): 6.6. Her Functional Capacity in METs is: 4.4 according to the Duke Activity Status Index (DASI). The patient is doing well from a cardiac perspective. Therefore, based on ACC/AHA guidelines, the patient would be at acceptable risk for the planned procedure without further cardiovascular testing.   The patient was advised that if she develops new symptoms prior to surgery to contact our office to arrange for a follow-up visit, and she verbalized understanding.  Per office protocol, patient can hold Xarelto  for 3 days prior to procedure as long as procedure is schedule on or after 01/11/24 due to history of a fib ablation on 10/09/23 and must remain on uninterrupted anticoagulation for 90 days post ablation.   A copy of this note will be routed to requesting surgeon.  Time:   Today, I have spent 10 minutes with the patient with telehealth technology discussing medical history, symptoms, and management plan.    Hailey EMERSON Bane, NP-C  01/01/2024, 10:29 AM [address], [address] Office [phone] Fax [phone]

## 2024-01-06 ENCOUNTER — Other Ambulatory Visit: Payer: Self-pay | Admitting: Internal Medicine

## 2024-01-06 ENCOUNTER — Other Ambulatory Visit (INDEPENDENT_AMBULATORY_CARE_PROVIDER_SITE_OTHER): Payer: Self-pay | Admitting: Gastroenterology

## 2024-01-08 NOTE — Telephone Encounter (Signed)
 Needs office visit. Not seen since 2023.

## 2024-01-10 DIAGNOSIS — Z01818 Encounter for other preprocedural examination: Secondary | ICD-10-CM | POA: Diagnosis not present

## 2024-01-15 ENCOUNTER — Encounter: Payer: Self-pay | Admitting: Family Medicine

## 2024-01-18 NOTE — Progress Notes (Signed)
Sent message, via epic in basket, requesting orders in epic from surgeon.  

## 2024-01-20 ENCOUNTER — Other Ambulatory Visit: Payer: Self-pay | Admitting: Internal Medicine

## 2024-01-23 DIAGNOSIS — G8929 Other chronic pain: Secondary | ICD-10-CM | POA: Diagnosis not present

## 2024-01-23 NOTE — Patient Instructions (Signed)
SURGICAL WAITING ROOM VISITATION  Patients having surgery or a procedure may have no more than 2 support people in the waiting area - these visitors may rotate.    Children under the age of 1 must have an adult with them who is not the patient.  Due to an increase in RSV and influenza rates and associated hospitalizations, children ages 90 and under may not visit patients in [facility] hospitals.  Visitors with respiratory illnesses are discouraged from visiting and should remain at home.  If the patient needs to stay at the hospital during part of their recovery, the visitor guidelines for inpatient rooms apply. Pre-op nurse will coordinate an appropriate time for 1 support person to accompany patient in pre-op.  This support person may not rotate.    Please refer to the [facility] website for the visitor guidelines for Inpatients (after your surgery is over and you are in a regular room).       Your procedure is scheduled on: 02/06/24   Report to [facility] Main Entrance    Report to admitting at 9:30 AM   Call this number if you have problems the morning of surgery [phone]   Do not eat food :After Midnight.   After Midnight you may have the following liquids until 9 AM DAY OF SURGERY  Water Non-Citrus Juices (without pulp, NO RED-Apple, White grape, White cranberry) Black Coffee (NO MILK/CREAM OR CREAMERS, sugar ok)  Clear Tea (NO MILK/CREAM OR CREAMERS, sugar ok) regular and decaf                             Plain Jell-O (NO RED)                                           Fruit ices (not with fruit pulp, NO RED)                                     Popsicles (NO RED)                                                               Sports drinks like Gatorade (NO RED)   The day of surgery:  Drink ONE (1) Pre-Surgery  G2 at 9 AM the morning of surgery. Drink in one sitting. Do not sip.  This drink was given to you during your hospital  pre-op appointment  visit. Nothing else to drink after completing the  Pre-Surgery G2.   Oral Hygiene is also important to reduce your risk of infection.                                    Remember - BRUSH YOUR TEETH THE MORNING OF SURGERY WITH YOUR REGULAR TOOTHPASTE  DENTURES WILL BE REMOVED PRIOR TO SURGERY PLEASE DO NOT APPLY "Poly grip" OR ADHESIVES!!!   Stop all vitamins and herbal supplements 7 days before surgery.   Take these medicines the morning of surgery with  A SIP OF WATER: Tylenol if needed, Valium if needed, Gabapentin, Singulair ,Omeprazole  DO NOT TAKE ANY ORAL DIABETIC MEDICATIONS DAY OF YOUR SURGERY Hold Metformin the morning of surgery.  Bring CPAP mask and tubing day of surgery.                              You may not have any metal on your body including hair pins, jewelry, and body piercing             Do not wear make-up, lotions, powders, perfumes/cologne, or deodorant  Do not wear nail polish including gel and S&S, artificial/acrylic nails, or any other type of covering on natural nails including finger and toenails. If you have artificial nails, gel coating, etc. that needs to be removed by a nail salon please have this removed prior to surgery or surgery may need to be canceled/ delayed if the surgeon/ anesthesia feels like they are unable to be safely monitored.   Do not shave  48 hours prior to surgery.    Do not bring valuables to the hospital. [facility] IS NOT             RESPONSIBLE   FOR VALUABLES.   Contacts, glasses, dentures or bridgework may not be worn into surgery.  DO NOT BRING YOUR HOME MEDICATIONS TO THE HOSPITAL. PHARMACY WILL DISPENSE MEDICATIONS LISTED ON YOUR MEDICATION LIST TO YOU DURING YOUR ADMISSION IN THE HOSPITAL!    Patients discharged on the day of surgery will not be allowed to drive home.  Someone NEEDS to stay with you for the first 24 hours after anesthesia.   Special Instructions: Bring a copy of your healthcare power of attorney and  living will documents the day of surgery if you haven't scanned them before.              Please read over the following fact sheets you were given: IF YOU HAVE QUESTIONS ABOUT YOUR PRE-OP INSTRUCTIONS PLEASE CALL [phone] [facility]   If you received a COVID test during your pre-op visit  it is requested that you wear a mask when out in public, stay away from anyone that may not be feeling well and notify your surgeon if you develop symptoms. If you test positive for Covid or have been in contact with anyone that has tested positive in the last 10 days please notify you surgeon.     Pre-operative 5 CHG Bath Instructions   You can play a key role in reducing the risk of infection after surgery. Your skin needs to be as free of germs as possible. You can reduce the number of germs on your skin by washing with CHG (chlorhexidine gluconate) soap before surgery. CHG is an antiseptic soap that kills germs and continues to kill germs even after washing.   DO NOT use if you have an allergy to chlorhexidine/CHG or antibacterial soaps. If your skin becomes reddened or irritated, stop using the CHG and notify one of our RNs at [phone].   Please shower with the CHG soap starting 4 days before surgery using the following schedule:     Please keep in mind the following:  DO NOT shave, including legs and underarms, starting the day of your first shower.   You may shave your face at any point before/day of surgery.  Place clean sheets on your bed the day you start using CHG soap. Use a clean washcloth (not  used since being washed) for each shower. DO NOT sleep with pets once you start using the CHG.   CHG Shower Instructions:  If you choose to wash your hair and private area, wash first with your normal shampoo/soap.  After you use shampoo/soap, rinse your hair and body thoroughly to remove shampoo/soap residue.  Turn the water OFF and apply about 3 tablespoons (45 ml) of CHG soap to a CLEAN  washcloth.  Apply CHG soap ONLY FROM YOUR NECK DOWN TO YOUR TOES (washing for 3-5 minutes)  DO NOT use CHG soap on face, private areas, open wounds, or sores.  Pay special attention to the area where your surgery is being performed.  If you are having back surgery, having someone wash your back for you may be helpful. Wait 2 minutes after CHG soap is applied, then you may rinse off the CHG soap.  Pat dry with a clean towel  Put on clean clothes/pajamas   If you choose to wear lotion, please use ONLY the CHG-compatible lotions on the back of this paper.     Additional instructions for the day of surgery: DO NOT APPLY any lotions, deodorants, cologne, or perfumes.   Put on clean/comfortable clothes.  Brush your teeth.  Ask your nurse before applying any prescription medications to the skin.   CHG Compatible Lotions   Aveeno Moisturizing lotion  Cetaphil Moisturizing Cream  Cetaphil Moisturizing Lotion  Clairol Herbal Essence Moisturizing Lotion, Dry Skin  Clairol Herbal Essence Moisturizing Lotion, Extra Dry Skin  Clairol Herbal Essence Moisturizing Lotion, Normal Skin  Curel Age Defying Therapeutic Moisturizing Lotion with Alpha Hydroxy  Curel Extreme Care Body Lotion  Curel Soothing Hands Moisturizing Hand Lotion  Curel Therapeutic Moisturizing Cream, Fragrance-Free  Curel Therapeutic Moisturizing Lotion, Fragrance-Free  Curel Therapeutic Moisturizing Lotion, Original Formula  Eucerin Daily Replenishing Lotion  Eucerin Dry Skin Therapy Plus Alpha Hydroxy Crme  Eucerin Dry Skin Therapy Plus Alpha Hydroxy Lotion  Eucerin Original Crme  Eucerin Original Lotion  Eucerin Plus Crme Eucerin Plus Lotion  Eucerin TriLipid Replenishing Lotion  Keri Anti-Bacterial Hand Lotion  Keri Deep Conditioning Original Lotion Dry Skin Formula Softly Scented  Keri Deep Conditioning Original Lotion, Fragrance Free Sensitive Skin Formula  Keri Lotion Fast Absorbing Fragrance Free Sensitive Skin  Formula  Keri Lotion Fast Absorbing Softly Scented Dry Skin Formula  Keri Original Lotion  Keri Skin Renewal Lotion Keri Silky Smooth Lotion  Keri Silky Smooth Sensitive Skin Lotion  Nivea Body Creamy Conditioning Oil  Nivea Body Extra Enriched Lotion  Nivea Body Original Lotion  Nivea Body Sheer Moisturizing Lotion Nivea Crme  Nivea Skin Firming Lotion  NutraDerm 30 Skin Lotion  NutraDerm Skin Lotion  NutraDerm Therapeutic Skin Cream  NutraDerm Therapeutic Skin Lotion  ProShield Protective Hand Cream   Incentive Spirometer (Watch this video at home: [email or web address])  An incentive spirometer is a tool that can help keep your lungs clear and active. This tool measures how well you are filling your lungs with each breath. Taking long deep breaths may help reverse or decrease the chance of developing breathing (pulmonary) problems (especially infection) following: A long period of time when you are unable to move or be active. BEFORE THE PROCEDURE  If the spirometer includes an indicator to show your best effort, your nurse or respiratory therapist will set it to a desired goal. If possible, sit up straight or lean slightly forward. Try not to slouch. Hold the incentive spirometer in an upright position. INSTRUCTIONS FOR USE  Sit  on the edge of your bed if possible, or sit up as far as you can in bed or on a chair. Hold the incentive spirometer in an upright position. Breathe out normally. Place the mouthpiece in your mouth and seal your lips tightly around it. Breathe in slowly and as deeply as possible, raising the piston or the ball toward the top of the column. Hold your breath for 3-5 seconds or for as long as possible. Allow the piston or ball to fall to the bottom of the column. Remove the mouthpiece from your mouth and breathe out normally. Rest for a few seconds and repeat Steps 1 through 7 at least 10 times every 1-2 hours when you are awake. Take  your time and take a few normal breaths between deep breaths. The spirometer may include an indicator to show your best effort. Use the indicator as a goal to work toward during each repetition. After each set of 10 deep breaths, practice coughing to be sure your lungs are clear. If you have an incision (the cut made at the time of surgery), support your incision when coughing by placing a pillow or rolled up towels firmly against it. Once you are able to get out of bed, walk around indoors and cough well. You may stop using the incentive spirometer when instructed by your caregiver.  RISKS AND COMPLICATIONS Take your time so you do not get dizzy or light-headed. If you are in pain, you may need to take or ask for pain medication before doing incentive spirometry. It is harder to take a deep breath if you are having pain. AFTER USE Rest and breathe slowly and easily. It can be helpful to keep track of a log of your progress. Your caregiver can provide you with a simple table to help with this. If you are using the spirometer at home, follow these instructions: SEEK MEDICAL CARE IF:  You are having difficultly using the spirometer. You have trouble using the spirometer as often as instructed. Your pain medication is not giving enough relief while using the spirometer. You develop fever of 100.5 F (38.1 C) or higher. SEEK IMMEDIATE MEDICAL CARE IF:  You cough up bloody sputum that had not been present before. You develop fever of 102 F (38.9 C) or greater. You develop worsening pain at or near the incision site. MAKE SURE YOU:  Understand these instructions. Will watch your condition. Will get help right away if you are not doing well or get worse. Document Released: 04/24/2007 Document Revised: 03/05/2012 Document Reviewed: 06/25/2007 [facility] Patient Information 2014 [place], [place].

## 2024-01-23 NOTE — Progress Notes (Signed)
COVID Vaccine received:  []  No []  Yes Date of any COVID positive Test in last 90 days:  PCP - Assunta Found MD Cardiologist - Luane School MD Lissa Hoard MD  Cardiac clearance -Eligha Bridegroom -01/01/24  Chest x-ray - 08/09/23 Epic EKG -  11/06/23 Epic Stress Test -  ECHO - 06/14/23 Epic Cardiac Cath -   Bowel Prep - []  No  []   Yes ______  Pacemaker / ICD device []  No []  Yes   Spinal Cord Stimulator:[]  No []  Yes       History of Sleep Apnea? []  No []  Yes   CPAP used?- []  No []  Yes    Does the patient monitor blood sugar?          []  No []  Yes  []  N/A  Patient has: []  NO Hx DM   []  Pre-DM                 []  DM1  []   DM2 Does patient have a [employer] or Dexacom? []  No []  Yes   Fasting Blood Sugar Ranges-  Checks Blood Sugar _____ times a day  GLP1 agonist / usual dose -  GLP1 instructions:  SGLT-2 inhibitors / usual dose -  SGLT-2 instructions:   Blood Thinner / Instructions: Aspirin Instructions:  Comments:   Activity level: Patient is able / unable to climb a flight of stairs without difficulty; []  No CP  []  No SOB, but would have ___   Patient can / can not perform ADLs without assistance.   Anesthesia review:   Patient denies shortness of breath, fever, cough and chest pain at PAT appointment.  Patient verbalized understanding and agreement to the Pre-Surgical Instructions that were given to them at this PAT appointment. Patient was also educated of the need to review these PAT instructions again prior to his/her surgery.I reviewed the appropriate phone numbers to call if they have any and questions or concerns.

## 2024-01-25 ENCOUNTER — Encounter (HOSPITAL_COMMUNITY)
Admission: RE | Admit: 2024-01-25 | Discharge: 2024-01-25 | Disposition: A | Discharge status: Home / Self Care | Payer: HMO | Source: Ambulatory Visit | Attending: Orthopedic Surgery | Admitting: Orthopedic Surgery

## 2024-01-25 ENCOUNTER — Encounter (HOSPITAL_COMMUNITY): Payer: Self-pay

## 2024-01-25 ENCOUNTER — Other Ambulatory Visit: Payer: Self-pay

## 2024-01-25 VITALS — BP 134/79 | HR 97 | Temp 98.0°F | Ht 64.0 in | Wt 193.0 lb

## 2024-01-25 DIAGNOSIS — E119 Type 2 diabetes mellitus without complications: Secondary | ICD-10-CM | POA: Diagnosis not present

## 2024-01-25 DIAGNOSIS — I10 Essential (primary) hypertension: Secondary | ICD-10-CM | POA: Diagnosis not present

## 2024-01-25 DIAGNOSIS — Z01818 Encounter for other preprocedural examination: Secondary | ICD-10-CM

## 2024-01-25 DIAGNOSIS — Z01812 Encounter for preprocedural laboratory examination: Secondary | ICD-10-CM | POA: Diagnosis not present

## 2024-01-25 HISTORY — DX: Cardiac arrhythmia, unspecified: I49.9

## 2024-01-25 HISTORY — DX: Depression, unspecified: F32.A

## 2024-01-25 HISTORY — DX: Dyspnea, unspecified: R06.00

## 2024-01-25 LAB — CBC
HCT: 44.7 % (ref 36.0–46.0)
Hemoglobin: 14.3 g/dL (ref 12.0–15.0)
MCH: 29.7 pg (ref 26.0–34.0)
MCHC: 32 g/dL (ref 30.0–36.0)
MCV: 92.9 fL (ref 80.0–100.0)
Platelets: 300 10*3/uL (ref 150–400)
RBC: 4.81 MIL/uL (ref 3.87–5.11)
RDW: 14.3 % (ref 11.5–15.5)
WBC: 9.7 10*3/uL (ref 4.0–10.5)
nRBC: 0 % (ref 0.0–0.2)

## 2024-01-25 LAB — BASIC METABOLIC PANEL
Anion gap: 11 (ref 5–15)
BUN: 14 mg/dL (ref 8–23)
CO2: 26 mmol/L (ref 22–32)
Calcium: 10.3 mg/dL (ref 8.9–10.3)
Chloride: 103 mmol/L (ref 98–111)
Creatinine, Ser: 0.91 mg/dL (ref 0.44–1.00)
GFR, Estimated: >60 mL/min (ref >60)
Glucose, Bld: 118 mg/dL — ABNORMAL HIGH (ref 70–99)
Potassium: 4.1 mmol/L (ref 3.5–5.1)
Sodium: 140 mmol/L (ref 135–145)

## 2024-01-25 LAB — GLUCOSE, CAPILLARY: Glucose-Capillary: 140 mg/dL — ABNORMAL HIGH (ref 70–99)

## 2024-01-25 LAB — HEMOGLOBIN A1C
Hgb A1c MFr Bld: 6.3 % — ABNORMAL HIGH (ref 4.8–5.6)
Mean Plasma Glucose: 134.11 mg/dL

## 2024-01-25 LAB — SURGICAL PCR SCREEN
MRSA, PCR: NEGATIVE
Staphylococcus aureus: NEGATIVE

## 2024-01-25 NOTE — Care Plan (Signed)
Ortho Bundle Case Management Note  Patient Details  Name: [name] MRN: [id number] Date of Birth: 02-10-1946  Patient plans to discharge to home with family to assist. Has DME at home. HHPT referral to [facility] and OPPT set up with [facility]- [facility]. Patient and MD in agreement with plan. Choice offered                     DME Arranged:    DME Agency:     HH Arranged:  PT HH Agency:  [facility]  Additional Comments: Please contact me with any questions of if this plan should need to change.  Shauna Hugh,  RN,BSN,MHA,CCM  [facility] Orthopaedic Specialist  [phone] 01/25/2024, 7:45 AM

## 2024-01-28 NOTE — Progress Notes (Unsigned)
  Electrophysiology Office Note:   Date:  01/28/2024  ID:  [name], [name] 05/28/1946, MRN [id number]  Primary Cardiologist: [name], MD Electrophysiologist: [name], MD  {Click to update primary MD,subspecialty MD or APP then REFRESH:1}    History of Present Illness:   [name] is a 78 y.o. female with h/o HTN, OSA, HFpEF, T2DM, and paroxysmal atrial fibrillation who is being seen today for follow-up evaluation.  Patient underwent pulmonary vein isolation and left atrial posterior wall isolation on 10/09/23.  Discussed the use of AI scribe software for clinical note transcription with the patient, who gave verbal consent to proceed.  History of Present Illness            Review of systems complete and found to be negative unless listed in HPI.   EP Information / Studies Reviewed:    {EKGtoday:28818}      ZIO monitor 07/24/2023: Coarse atrial fibrillation, possible flutter, and a postconversion pause of 4.6 seconds.   EKG 06/21/2023: Coarse atrial fibrillation.   Echocardiogram 06/14/2023: LVEF is 60 to 65%.  Grade 1 diastolic dysfunction. No significant valvular disease.  Left and right atrial size were reported as normal.   Nuclear stress test 11/03/2017: Reported as low risk with soft tissue attenuation artifacts.  Risk Assessment/Calculations:    CHA2DS2-VASc Score = 5  {Confirm score is correct.  If not, click here to update score.  REFRESH note.  :1} This indicates a 7.2% annual risk of stroke. The patient's score is based upon: CHF History: 1 HTN History: 0 Diabetes History: 1 Stroke History: 0 Vascular Disease History: 0 Age Score: 2 Gender Score: 1   {This patient has a significant risk of stroke if diagnosed with atrial fibrillation.  Please consider VKA or DOAC agent for anticoagulation if the bleeding risk is acceptable.   You can also use the SmartPhrase .[email or web address] for documentation.   :[id number]} No BP recorded.  {Refresh Note  OR Click here to enter BP  :1}***   STOP-Bang Score:  3  { Consider Dx Sleep Disordered Breathing or Sleep Apnea  ICD G47.33          :1}     Physical Exam:   VS:  There were no vitals taken for this visit.   Wt Readings from Last 3 Encounters:  01/25/24 193 lb (87.5 kg)  11/06/23 204 lb 3.2 oz (92.6 kg)  10/09/23 205 lb (93 kg)     GEN: Well nourished, well developed in no acute distress NECK: No JVD; No carotid bruits CARDIAC: {EPRHYTHM:28826}, no murmurs, rubs, gallops RESPIRATORY:  Clear to auscultation without rales, wheezing or rhonchi  ABDOMEN: Soft, non-tender, non-distended EXTREMITIES:  No edema; No deformity   ASSESSMENT AND PLAN:   #Paroxysmal atrial fibrillation: S/p ablation 10/09/23: Appears to be maintaining sinus rhythm.   #Hypercoagulable state secondary to atrial fibrillation: Tolerating Xarelto without issue.  -Continue rivaroxaban 20 mg once daily.   #Chronic diastolic heart failure: Appears well compensated on exam today.  Given history of diastolic heart failure, she would benefit most from rhythm control.  -Continue current regimen of furosemide and follow-up with general cardiology.   #Obstructive sleep apnea: She had a home sleep study that was suggestive of sleep apnea.   -Formal sleep study is scheduled.    Follow up with {XLKGM:01027} {EPFOLLOW OZ:36644}  Signed, [name], MD

## 2024-01-29 ENCOUNTER — Ambulatory Visit: Payer: HMO | Attending: Cardiology | Admitting: Cardiology

## 2024-01-29 ENCOUNTER — Encounter: Payer: Self-pay | Admitting: Cardiology

## 2024-01-29 VITALS — BP 124/70 | HR 99 | Ht 64.0 in | Wt 194.8 lb

## 2024-01-29 DIAGNOSIS — D6869 Other thrombophilia: Secondary | ICD-10-CM

## 2024-01-29 DIAGNOSIS — Z01810 Encounter for preprocedural cardiovascular examination: Secondary | ICD-10-CM | POA: Diagnosis not present

## 2024-01-29 DIAGNOSIS — I5032 Chronic diastolic (congestive) heart failure: Secondary | ICD-10-CM | POA: Diagnosis not present

## 2024-01-29 DIAGNOSIS — M1711 Unilateral primary osteoarthritis, right knee: Secondary | ICD-10-CM | POA: Diagnosis not present

## 2024-01-29 DIAGNOSIS — I480 Paroxysmal atrial fibrillation: Secondary | ICD-10-CM

## 2024-01-29 NOTE — Patient Instructions (Addendum)
Medication Instructions:  Your physician has recommended you make the following change in your medication:  1) STOP taking Cardizem (diltiazem)  *If you need a refill on your cardiac medications before your next appointment, please call your pharmacy* Follow-Up: At [facility], you and your health needs are our priority.  As part of our continuing mission to provide you with exceptional heart care, we have created designated Provider Care Teams.  These Care Teams include your primary Cardiologist (physician) and Advanced Practice Providers (APPs -  Physician Assistants and Nurse Practitioners) who all work together to provide you with the care you need, when you need it.  Your next appointment:   1 year  Provider:   You may see [name], MD or one of the following Advanced Practice Providers on your designated Care Team:   Francis Dowse, [place] [address]" [place], [place] Sherie Don, NP Canary Brim, NP

## 2024-01-30 DIAGNOSIS — G4733 Obstructive sleep apnea (adult) (pediatric): Secondary | ICD-10-CM | POA: Diagnosis not present

## 2024-02-01 NOTE — Anesthesia Preprocedure Evaluation (Addendum)
Anesthesia Evaluation  Patient identified by MRN, date of birth, ID band Patient awake    Reviewed: Allergy & Precautions, H&P , NPO status , Patient's Chart, lab work & pertinent test results  History of Anesthesia Complications (+) PONV, PROLONGED EMERGENCE and history of anesthetic complications  Airway Mallampati: IV  TM Distance: >3 FB Neck ROM: Full    Dental  (+) Teeth Intact, Dental Advisory Given   Pulmonary asthma , sleep apnea and Continuous Positive Airway Pressure Ventilation    Pulmonary exam normal breath sounds clear to auscultation       Cardiovascular hypertension (175/88 preop), Pt. on medications Normal cardiovascular exam+ dysrhythmias (s/p ablation, LD xarelto 4d ago) Atrial Fibrillation  Rhythm:Regular Rate:Normal     Neuro/Psych  PSYCHIATRIC DISORDERS Anxiety Depression    negative neurological ROS     GI/Hepatic Neg liver ROS, hiatal hernia, PUD,GERD  ,,  Endo/Other  negative endocrine ROSdiabetes, Well Controlled, Type 2, Oral Hypoglycemic Agents    Renal/GU negative Renal ROS  negative genitourinary   Musculoskeletal  (+) Arthritis , Osteoarthritis,    Abdominal  (+) + obese  Peds negative pediatric ROS (+)  Hematology negative hematology ROS (+) Hb 14.3, plt 300   Anesthesia Other Findings   Reproductive/Obstetrics negative OB ROS                             Anesthesia Physical Anesthesia Plan  ASA: 3  Anesthesia Plan: Spinal, Regional and MAC   Post-op Pain Management: Tylenol PO (pre-op)* and Regional block*   Induction:   PONV Risk Score and Plan: 2 and Propofol infusion and TIVA  Airway Management Planned: Natural Airway and Nasal Cannula  Additional Equipment: None  Intra-op Plan:   Post-operative Plan:   Informed Consent: I have reviewed the patients History and Physical, chart, labs and discussed the procedure including the risks, benefits  and alternatives for the proposed anesthesia with the patient or authorized representative who has indicated his/her understanding and acceptance.       Plan Discussed with: CRNA  Anesthesia Plan Comments:        Anesthesia Quick Evaluation

## 2024-02-05 ENCOUNTER — Ambulatory Visit: Payer: HMO | Admitting: Cardiology

## 2024-02-06 ENCOUNTER — Ambulatory Visit (HOSPITAL_COMMUNITY): Payer: HMO | Admitting: Anesthesiology

## 2024-02-06 ENCOUNTER — Encounter (HOSPITAL_COMMUNITY): Payer: Self-pay | Admitting: Orthopedic Surgery

## 2024-02-06 ENCOUNTER — Ambulatory Visit (HOSPITAL_COMMUNITY): Payer: HMO

## 2024-02-06 ENCOUNTER — Ambulatory Visit (HOSPITAL_COMMUNITY): Payer: HMO | Admitting: Physician Assistant

## 2024-02-06 ENCOUNTER — Other Ambulatory Visit: Payer: Self-pay

## 2024-02-06 ENCOUNTER — Observation Stay (HOSPITAL_COMMUNITY)
Admission: RE | Admit: 2024-02-06 | Discharge: 2024-02-07 | Disposition: A | Discharge status: Home Health | Payer: HMO | Source: Ambulatory Visit | Attending: Orthopedic Surgery | Admitting: Orthopedic Surgery

## 2024-02-06 ENCOUNTER — Encounter (HOSPITAL_COMMUNITY)
Admission: RE | Disposition: A | Discharge status: Home Health | Payer: Self-pay | Source: Ambulatory Visit | Attending: Orthopedic Surgery

## 2024-02-06 DIAGNOSIS — I110 Hypertensive heart disease with heart failure: Secondary | ICD-10-CM | POA: Diagnosis not present

## 2024-02-06 DIAGNOSIS — Z96651 Presence of right artificial knee joint: Secondary | ICD-10-CM | POA: Diagnosis not present

## 2024-02-06 DIAGNOSIS — Z79899 Other long term (current) drug therapy: Secondary | ICD-10-CM | POA: Insufficient documentation

## 2024-02-06 DIAGNOSIS — E119 Type 2 diabetes mellitus without complications: Secondary | ICD-10-CM | POA: Insufficient documentation

## 2024-02-06 DIAGNOSIS — J45909 Unspecified asthma, uncomplicated: Secondary | ICD-10-CM | POA: Insufficient documentation

## 2024-02-06 DIAGNOSIS — Z7901 Long term (current) use of anticoagulants: Secondary | ICD-10-CM | POA: Insufficient documentation

## 2024-02-06 DIAGNOSIS — Z471 Aftercare following joint replacement surgery: Secondary | ICD-10-CM | POA: Diagnosis not present

## 2024-02-06 DIAGNOSIS — M1711 Unilateral primary osteoarthritis, right knee: Principal | ICD-10-CM | POA: Diagnosis present

## 2024-02-06 DIAGNOSIS — I10 Essential (primary) hypertension: Secondary | ICD-10-CM | POA: Insufficient documentation

## 2024-02-06 DIAGNOSIS — Z7984 Long term (current) use of oral hypoglycemic drugs: Secondary | ICD-10-CM | POA: Diagnosis not present

## 2024-02-06 DIAGNOSIS — I5020 Unspecified systolic (congestive) heart failure: Secondary | ICD-10-CM

## 2024-02-06 DIAGNOSIS — Z96652 Presence of left artificial knee joint: Secondary | ICD-10-CM | POA: Insufficient documentation

## 2024-02-06 DIAGNOSIS — R609 Edema, unspecified: Secondary | ICD-10-CM | POA: Diagnosis not present

## 2024-02-06 DIAGNOSIS — S82401A Unspecified fracture of shaft of right fibula, initial encounter for closed fracture: Secondary | ICD-10-CM | POA: Diagnosis not present

## 2024-02-06 DIAGNOSIS — G8918 Other acute postprocedural pain: Secondary | ICD-10-CM | POA: Diagnosis not present

## 2024-02-06 HISTORY — PX: TOTAL KNEE ARTHROPLASTY: SHX125

## 2024-02-06 HISTORY — DX: Unilateral primary osteoarthritis, right knee: M17.11

## 2024-02-06 LAB — GLUCOSE, CAPILLARY
Glucose-Capillary: 99 mg/dL (ref 70–99)
Glucose-Capillary: 124 mg/dL — ABNORMAL HIGH (ref 70–99)

## 2024-02-06 SURGERY — ARTHROPLASTY, KNEE, TOTAL
Anesthesia: Monitor Anesthesia Care | Site: Knee | Laterality: Right

## 2024-02-06 MED ORDER — BUPIVACAINE IN DEXTROSE 0.75-8.25 % IT SOLN
INTRATHECAL | Status: DC | PRN
  Administered 2024-02-06: 1.8 mL via INTRATHECAL

## 2024-02-06 MED ORDER — CEFAZOLIN SODIUM-DEXTROSE 2-4 GM/100ML-% IV SOLN
INTRAVENOUS | Status: AC
  Filled 2024-02-06: qty 100

## 2024-02-06 MED ORDER — ACETAMINOPHEN 500 MG PO TABS
1000.0000 mg | ORAL_TABLET | Freq: Four times a day (QID) | ORAL | Status: AC
  Administered 2024-02-06 – 2024-02-07 (×3): 1000 mg via ORAL
  Filled 2024-02-06 (×3): qty 2

## 2024-02-06 MED ORDER — BISACODYL 10 MG RE SUPP: 10.0000 mg | Freq: Every day | RECTAL | Status: DC | PRN

## 2024-02-06 MED ORDER — KETOROLAC TROMETHAMINE 30 MG/ML IJ SOLN
INTRAMUSCULAR | Status: DC | PRN
  Administered 2024-02-06: 30 mg

## 2024-02-06 MED ORDER — HYDROMORPHONE HCL 1 MG/ML IJ SOLN
INTRAMUSCULAR | Status: AC
  Filled 2024-02-06: qty 1

## 2024-02-06 MED ORDER — TRANEXAMIC ACID-NACL 1000-0.7 MG/100ML-% IV SOLN
1000.0000 mg | INTRAVENOUS | Status: DC
  Filled 2024-02-06: qty 100

## 2024-02-06 MED ORDER — KETOROLAC TROMETHAMINE 30 MG/ML IJ SOLN
INTRAMUSCULAR | Status: AC
  Filled 2024-02-06: qty 1

## 2024-02-06 MED ORDER — LEVOFLOXACIN IN D5W 500 MG/100ML IV SOLN
500.0000 mg | INTRAVENOUS | Status: DC
  Filled 2024-02-06: qty 100

## 2024-02-06 MED ORDER — FAMOTIDINE IN NACL 20-0.9 MG/50ML-% IV SOLN
INTRAVENOUS | Status: AC
  Filled 2024-02-06: qty 50

## 2024-02-06 MED ORDER — DEXAMETHASONE SODIUM PHOSPHATE 10 MG/ML IJ SOLN
10.0000 mg | Freq: Once | INTRAMUSCULAR | Status: AC
  Administered 2024-02-07: 10 mg via INTRAVENOUS
  Filled 2024-02-06: qty 1

## 2024-02-06 MED ORDER — MIDAZOLAM HCL 5 MG/5ML IJ SOLN
INTRAMUSCULAR | Status: DC | PRN
  Administered 2024-02-06: 2 mg via INTRAVENOUS

## 2024-02-06 MED ORDER — LACTATED RINGERS IV SOLN: INTRAVENOUS | Status: DC

## 2024-02-06 MED ORDER — OXYCODONE HCL 5 MG PO TABS: 5.0000 mg | ORAL_TABLET | Freq: Once | ORAL | Status: DC | PRN

## 2024-02-06 MED ORDER — BUPIVACAINE HCL 0.25 % IJ SOLN
INTRAMUSCULAR | Status: AC
  Filled 2024-02-06: qty 1

## 2024-02-06 MED ORDER — METOCLOPRAMIDE HCL 5 MG/ML IJ SOLN: 5.0000 mg | Freq: Three times a day (TID) | INTRAMUSCULAR | Status: DC | PRN

## 2024-02-06 MED ORDER — DOCUSATE SODIUM 100 MG PO CAPS
100.0000 mg | ORAL_CAPSULE | Freq: Two times a day (BID) | ORAL | Status: DC
  Administered 2024-02-06 – 2024-02-07 (×2): 100 mg via ORAL
  Filled 2024-02-06 (×2): qty 1

## 2024-02-06 MED ORDER — CEFAZOLIN SODIUM-DEXTROSE 2-4 GM/100ML-% IV SOLN
2.0000 g | Freq: Four times a day (QID) | INTRAVENOUS | Status: AC
  Administered 2024-02-06 (×2): 2 g via INTRAVENOUS
  Filled 2024-02-06: qty 100

## 2024-02-06 MED ORDER — LACTATED RINGERS IV BOLUS: 250.0000 mL | Freq: Once | INTRAVENOUS | Status: DC

## 2024-02-06 MED ORDER — CHLORHEXIDINE GLUCONATE 0.12 % MT SOLN
15.0000 mL | Freq: Once | OROMUCOSAL | Status: AC
  Administered 2024-02-06: 15 mL via OROMUCOSAL

## 2024-02-06 MED ORDER — PROPOFOL 500 MG/50ML IV EMUL
INTRAVENOUS | Status: DC | PRN
  Administered 2024-02-06: 80 ug/kg/min via INTRAVENOUS

## 2024-02-06 MED ORDER — TRANEXAMIC ACID-NACL 1000-0.7 MG/100ML-% IV SOLN
INTRAVENOUS | Status: AC
  Filled 2024-02-06: qty 100

## 2024-02-06 MED ORDER — METOCLOPRAMIDE HCL 5 MG PO TABS: 5.0000 mg | ORAL_TABLET | Freq: Three times a day (TID) | ORAL | Status: DC | PRN

## 2024-02-06 MED ORDER — BACLOFEN 10 MG PO TABS: 10.0000 mg | ORAL_TABLET | Freq: Three times a day (TID) | ORAL | 0 refills | Status: DC

## 2024-02-06 MED ORDER — ZOLPIDEM TARTRATE 5 MG PO TABS: 5.0000 mg | ORAL_TABLET | Freq: Every evening | ORAL | Status: DC | PRN

## 2024-02-06 MED ORDER — DIPHENHYDRAMINE HCL 12.5 MG/5ML PO ELIX: 12.5000 mg | ORAL_SOLUTION | ORAL | Status: DC | PRN

## 2024-02-06 MED ORDER — OXYCODONE HCL 5 MG PO TABS
ORAL_TABLET | ORAL | Status: AC
  Filled 2024-02-06: qty 2

## 2024-02-06 MED ORDER — MAGNESIUM CITRATE PO SOLN: 1.0000 | Freq: Once | ORAL | Status: DC | PRN

## 2024-02-06 MED ORDER — ACETAMINOPHEN 325 MG PO TABS: 325.0000 mg | ORAL_TABLET | Freq: Four times a day (QID) | ORAL | Status: DC | PRN

## 2024-02-06 MED ORDER — SENNA-DOCUSATE SODIUM 8.6-50 MG PO TABS
2.0000 | ORAL_TABLET | Freq: Every day | ORAL | 1 refills | Status: DC
Start: 2024-02-06 — End: 2024-03-07

## 2024-02-06 MED ORDER — MIDAZOLAM HCL 2 MG/2ML IJ SOLN
INTRAMUSCULAR | Status: AC
  Filled 2024-02-06: qty 2

## 2024-02-06 MED ORDER — MENTHOL 3 MG MT LOZG: 1.0000 | LOZENGE | OROMUCOSAL | Status: DC | PRN

## 2024-02-06 MED ORDER — ORAL CARE MOUTH RINSE: 15.0000 mL | Freq: Once | OROMUCOSAL | Status: AC

## 2024-02-06 MED ORDER — ACETAMINOPHEN 500 MG PO TABS: 1000.0000 mg | ORAL_TABLET | Freq: Once | ORAL | Status: DC

## 2024-02-06 MED ORDER — ROPIVACAINE HCL 5 MG/ML IJ SOLN
INTRAMUSCULAR | Status: DC | PRN
Start: 2024-02-06 — End: 2024-02-06
  Administered 2024-02-06: 30 mL via PERINEURAL

## 2024-02-06 MED ORDER — LACTATED RINGERS IV BOLUS
500.0000 mL | Freq: Once | INTRAVENOUS | Status: AC
  Administered 2024-02-06: 500 mL via INTRAVENOUS

## 2024-02-06 MED ORDER — POLYETHYLENE GLYCOL 3350 17 G PO PACK: 17.0000 g | PACK | Freq: Every day | ORAL | Status: DC | PRN

## 2024-02-06 MED ORDER — FAMOTIDINE IN NACL 20-0.9 MG/50ML-% IV SOLN
20.0000 mg | Freq: Once | INTRAVENOUS | Status: AC
  Administered 2024-02-06: 20 mg via INTRAVENOUS
  Filled 2024-02-06: qty 50

## 2024-02-06 MED ORDER — ONDANSETRON HCL 4 MG/2ML IJ SOLN: 4.0000 mg | Freq: Four times a day (QID) | INTRAMUSCULAR | Status: DC | PRN

## 2024-02-06 MED ORDER — FENTANYL CITRATE (PF) 100 MCG/2ML IJ SOLN
INTRAMUSCULAR | Status: AC
Start: 2024-02-06 — End: ?
  Filled 2024-02-06: qty 2

## 2024-02-06 MED ORDER — DEXAMETHASONE SODIUM PHOSPHATE 10 MG/ML IJ SOLN
INTRAMUSCULAR | Status: DC | PRN
  Administered 2024-02-06: 10 mg

## 2024-02-06 MED ORDER — WATER FOR IRRIGATION, STERILE IR SOLN
Status: DC | PRN
  Administered 2024-02-06: 1000 mL

## 2024-02-06 MED ORDER — ALUM & MAG HYDROXIDE-SIMETH 200-200-20 MG/5ML PO SUSP: 30.0000 mL | ORAL | Status: DC | PRN

## 2024-02-06 MED ORDER — PHENYLEPHRINE HCL-NACL 20-0.9 MG/250ML-% IV SOLN
INTRAVENOUS | Status: DC | PRN
  Administered 2024-02-06: 25 ug/min via INTRAVENOUS

## 2024-02-06 MED ORDER — PROPOFOL 1000 MG/100ML IV EMUL
INTRAVENOUS | Status: AC
  Filled 2024-02-06: qty 100

## 2024-02-06 MED ORDER — ACETAMINOPHEN 500 MG PO TABS
1000.0000 mg | ORAL_TABLET | Freq: Once | ORAL | Status: AC
  Administered 2024-02-06: 1000 mg via ORAL
  Filled 2024-02-06: qty 2

## 2024-02-06 MED ORDER — OXYCODONE HCL 5 MG/5ML PO SOLN: 5.0000 mg | Freq: Once | ORAL | Status: DC | PRN

## 2024-02-06 MED ORDER — SODIUM CHLORIDE 0.9 % IR SOLN
Status: DC | PRN
  Administered 2024-02-06 (×2): 1000 mL

## 2024-02-06 MED ORDER — VANCOMYCIN HCL IN DEXTROSE 1-5 GM/200ML-% IV SOLN
1000.0000 mg | INTRAVENOUS | Status: DC
  Filled 2024-02-06: qty 200

## 2024-02-06 MED ORDER — ONDANSETRON HCL 4 MG/2ML IJ SOLN: 4.0000 mg | Freq: Once | INTRAMUSCULAR | Status: DC | PRN

## 2024-02-06 MED ORDER — ONDANSETRON HCL 4 MG PO TABS: 4.0000 mg | ORAL_TABLET | Freq: Four times a day (QID) | ORAL | Status: DC | PRN

## 2024-02-06 MED ORDER — OXYCODONE HCL 5 MG PO TABS
10.0000 mg | ORAL_TABLET | ORAL | Status: DC | PRN
  Administered 2024-02-07: 10 mg via ORAL
  Filled 2024-02-06: qty 2

## 2024-02-06 MED ORDER — FENTANYL CITRATE (PF) 100 MCG/2ML IJ SOLN
INTRAMUSCULAR | Status: DC | PRN
Start: 2024-02-06 — End: 2024-02-06
  Administered 2024-02-06 (×2): 25 ug via INTRAVENOUS

## 2024-02-06 MED ORDER — SODIUM CHLORIDE 0.9% FLUSH: 3.0000 mL | INTRAVENOUS | Status: DC | PRN

## 2024-02-06 MED ORDER — BUPIVACAINE HCL 0.25 % IJ SOLN
INTRAMUSCULAR | Status: DC | PRN
  Administered 2024-02-06: 30 mL

## 2024-02-06 MED ORDER — SODIUM CHLORIDE (PF) 0.9 % IJ SOLN
INTRAMUSCULAR | Status: AC
  Filled 2024-02-06: qty 50

## 2024-02-06 MED ORDER — CEFAZOLIN SODIUM-DEXTROSE 2-4 GM/100ML-% IV SOLN: 2.0000 g | Freq: Once | INTRAVENOUS | Status: DC

## 2024-02-06 MED ORDER — VANCOMYCIN HCL IN DEXTROSE 1-5 GM/200ML-% IV SOLN: 1000.0000 mg | INTRAVENOUS | Status: DC

## 2024-02-06 MED ORDER — OXYCODONE HCL 5 MG PO TABS
5.0000 mg | ORAL_TABLET | ORAL | Status: DC | PRN
  Administered 2024-02-06 – 2024-02-07 (×4): 10 mg via ORAL
  Filled 2024-02-06 (×3): qty 2

## 2024-02-06 MED ORDER — HYDROMORPHONE HCL 1 MG/ML IJ SOLN
0.5000 mg | INTRAMUSCULAR | Status: DC | PRN
  Administered 2024-02-06: 1 mg via INTRAVENOUS

## 2024-02-06 MED ORDER — POVIDONE-IODINE 10 % EX SWAB: 2.0000 | Freq: Once | CUTANEOUS | Status: DC

## 2024-02-06 MED ORDER — PHENOL 1.4 % MT LIQD: 1.0000 | OROMUCOSAL | Status: DC | PRN

## 2024-02-06 MED ORDER — SODIUM CHLORIDE 0.9% FLUSH
3.0000 mL | Freq: Two times a day (BID) | INTRAVENOUS | Status: DC
  Administered 2024-02-07: 10 mL via INTRAVENOUS

## 2024-02-06 MED ORDER — HYDROMORPHONE HCL 1 MG/ML IJ SOLN: 0.2500 mg | INTRAMUSCULAR | Status: DC | PRN

## 2024-02-06 MED ORDER — OXYCODONE HCL 5 MG PO TABS: 5.0000 mg | ORAL_TABLET | ORAL | 0 refills | Status: DC | PRN

## 2024-02-06 MED ORDER — TRANEXAMIC ACID-NACL 1000-0.7 MG/100ML-% IV SOLN
1000.0000 mg | Freq: Once | INTRAVENOUS | Status: AC
  Administered 2024-02-06: 1000 mg via INTRAVENOUS

## 2024-02-06 MED ORDER — AMISULPRIDE (ANTIEMETIC) 5 MG/2ML IV SOLN: 10.0000 mg | Freq: Once | INTRAVENOUS | Status: DC | PRN

## 2024-02-06 MED ORDER — ONDANSETRON HCL 4 MG PO TABS: 4.0000 mg | ORAL_TABLET | Freq: Three times a day (TID) | ORAL | 0 refills | Status: DC | PRN

## 2024-02-06 SURGICAL SUPPLY — 51 items
ATTUNE PS FEM RT SZ 5 CEM KNEE (Femur) IMPLANT
BAG COUNTER SPONGE SURGICOUNT (BAG) IMPLANT
BAG ZIPLOCK 12X15 (MISCELLANEOUS) IMPLANT
BASEPLATE TIB CMT FB PCKT SZ5 (Knees) IMPLANT
BLADE SAG 18X100X1.27 (BLADE) ×1 IMPLANT
BLADE SAW SGTL 11.0X1.19X90.0M (BLADE) IMPLANT
BLADE SAW SGTL 13X75X1.27 (BLADE) ×1 IMPLANT
BLADE SURG 15 STRL LF DISP TIS (BLADE) ×1 IMPLANT
BNDG ELASTIC 6X10 VLCR STRL LF (GAUZE/BANDAGES/DRESSINGS) ×1 IMPLANT
BNDG ELASTIC 6X15 VLCR STRL LF (GAUZE/BANDAGES/DRESSINGS) IMPLANT
BOWL SMART MIX CTS (DISPOSABLE) ×1 IMPLANT
CEMENT HV SMART SET (Cement) ×2 IMPLANT
CLSR STERI-STRIP ANTIMIC 1/2X4 (GAUZE/BANDAGES/DRESSINGS) ×2 IMPLANT
COVER SURGICAL LIGHT HANDLE (MISCELLANEOUS) ×1 IMPLANT
CUFF TRNQT CYL 34X4.125X (TOURNIQUET CUFF) ×1 IMPLANT
DRAPE SHEET LG 3/4 BI-LAMINATE (DRAPES) ×1 IMPLANT
DRAPE U-SHAPE 47X51 STRL (DRAPES) ×1 IMPLANT
DRSG MEPILEX POST OP 4X12 (GAUZE/BANDAGES/DRESSINGS) ×1 IMPLANT
DURAPREP 26ML APPLICATOR (WOUND CARE) ×2 IMPLANT
ELECT REM PT RETURN 15FT ADLT (MISCELLANEOUS) ×1 IMPLANT
GAUZE PAD ABD 8X10 STRL (GAUZE/BANDAGES/DRESSINGS) ×2 IMPLANT
GLOVE BIO SURGEON STRL SZ 6.5 (GLOVE) ×1 IMPLANT
GLOVE BIO SURGEON STRL SZ7.5 (GLOVE) ×1 IMPLANT
GLOVE BIOGEL M 7.0 STRL (GLOVE) IMPLANT
GLOVE BIOGEL PI IND STRL 7.0 (GLOVE) ×1 IMPLANT
GLOVE BIOGEL PI IND STRL 7.5 (GLOVE) IMPLANT
GLOVE BIOGEL PI IND STRL 8 (GLOVE) ×1 IMPLANT
GOWN STRL SURGICAL XL XLNG (GOWN DISPOSABLE) ×2 IMPLANT
HOLDER FOLEY CATH W/STRAP (MISCELLANEOUS) IMPLANT
IMMOBILIZER KNEE 20 (SOFTGOODS) ×1
IMMOBILIZER KNEE 20 THIGH 36 (SOFTGOODS) ×1 IMPLANT
INSERT TIB FIX BEARNG SZ 5 5MM (Insert) IMPLANT
KIT TURNOVER KIT A (KITS) IMPLANT
MANIFOLD NEPTUNE II (INSTRUMENTS) ×1 IMPLANT
NS IRRIG 1000ML POUR BTL (IV SOLUTION) ×1 IMPLANT
PACK TOTAL KNEE CUSTOM (KITS) ×1 IMPLANT
PATELLA MEDIAL ATTUN 35MM KNEE (Knees) IMPLANT
PIN STEINMAN FIXATION KNEE (PIN) IMPLANT
PROTECTOR NERVE ULNAR (MISCELLANEOUS) ×1 IMPLANT
SET HNDPC FAN SPRY TIP SCT (DISPOSABLE) ×1 IMPLANT
SET PAD KNEE POSITIONER (MISCELLANEOUS) ×1 IMPLANT
SPIKE FLUID TRANSFER (MISCELLANEOUS) IMPLANT
SUT MNCRL AB 3-0 PS2 18 (SUTURE) IMPLANT
SUT STRATAFIX PDS+ 0 24IN (SUTURE) ×1 IMPLANT
SUT VIC AB 1 CT1 36 (SUTURE) ×2 IMPLANT
SUT VIC AB 2-0 CT1 TAPERPNT 27 (SUTURE) ×1 IMPLANT
SUT VIC AB 3-0 SH 27X BRD (SUTURE) IMPLANT
TRAY FOLEY MTR SLVR 16FR STAT (SET/KITS/TRAYS/PACK) ×1 IMPLANT
TUBE SUCTION HIGH CAP CLEAR NV (SUCTIONS) ×1 IMPLANT
WATER STERILE IRR 1000ML POUR (IV SOLUTION) ×2 IMPLANT
WRAP KNEE MAXI GEL POST OP (GAUZE/BANDAGES/DRESSINGS) ×1 IMPLANT

## 2024-02-06 NOTE — Anesthesia Procedure Notes (Signed)
Anesthesia Regional Block: Adductor canal block   Pre-Anesthetic Checklist: , timeout performed,  Correct Patient, Correct Site, Correct Laterality,  Correct Procedure, Correct Position, site marked,  Risks and benefits discussed,  Surgical consent,  Pre-op evaluation,  At surgeon's request and post-op pain management  Laterality: Right  Prep: Maximum Sterile Barrier Precautions used, chloraprep       Needles:  Injection technique: Single-shot  Needle Type: Echogenic Stimulator Needle     Needle Length: 9cm  Needle Gauge: 22     Additional Needles:   Procedures:,,,, ultrasound used (permanent image in chart),,    Narrative:  Start time: 02/06/2024 7:10 AM End time: 02/06/2024 7:15 AM Injection made incrementally with aspirations every 5 mL.  Performed by: Personally  Anesthesiologist: Lannie Fields, DO  Additional Notes: Monitors applied. No increased pain on injection. No increased resistance to injection. Injection made in 5cc increments. Good needle visualization. Patient tolerated procedure well.

## 2024-02-06 NOTE — Op Note (Signed)
DATE OF SURGERY:  02/06/2024 TIME: 9:41 AM  PATIENT NAME:  [name]   AGE: 78 y.o.    PRE-OPERATIVE DIAGNOSIS: Right knee primary localized osteoarthritis  POST-OPERATIVE DIAGNOSIS:  Same  PROCEDURE: Right total Knee Arthroplasty  SURGEON:  Eulas Post, MD   ASSISTANT: Dan Humphreys, PA-C, present and scrubbed throughout the case, critical for assistance with exposure, retraction, instrumentation, and closure.  Second Assistant: Darron Doom, RNFA  OPERATIVE IMPLANTS:  Implant Name Type Inv. Item Serial No. Manufacturer Lot No. LRB No. Used Action  CEMENT HV SMART SET - [id number] Cement CEMENT HV SMART SET  [employer] [id number] Right 1 Implanted  CEMENT HV SMART SET - [id number] Cement CEMENT HV SMART SET  [employer] [id number] Right 1 Implanted  ATTUNE PS FEM RT SZ 5 CEM KNEE - [id number] Femur ATTUNE PS FEM RT SZ 5 CEM KNEE  [employer] [id number] Right 1 Implanted  PATELLA MEDIAL ATTUN KNEE - [id number] Knees PATELLA MEDIAL ATTUN KNEE  [employer]PU[employer]TH[employer] [id number] Right 1 Implanted  BASEPLATE TIB CMT FB PCKT SZ5 - [id number] Knees BASEPLATE TIB CMT FB PCKT SZ5  [employer] [id number] Right 1 Implanted  INSERT TIB FIX BEARNG SZ 5 - [id number] Insert INSERT TIB FIX BEARNG SZ 5  [employer] [id number] Right 1 Implanted     PREOPERATIVE INDICATIONS:  [name] is a 78 y.o. year old female with end stage bone on bone degenerative arthritis of the knee who failed conservative treatment, including injections, antiinflammatories, activity modification, and assistive devices, and had significant impairment of their activities of daily living, and elected for Total Knee Arthroplasty.   The risks, benefits, and alternatives were discussed at length including but not limited to the risks of infection, bleeding, nerve injury, stiffness, blood clots, the need for revision surgery, cardiopulmonary complications, among others, and they  were willing to proceed.  OPERATIVE FINDINGS AND UNIQUE ASPECTS OF THE CASE: As predicted, she had extreme osteoporosis, and the bone quality was extremely poor.  The patella had severe erosion, and the anterior flange was fairly dysplastic and shallow.  I used a 5, and had relatively good depth on the posterior cuts, 4 would have been fairly loose, and I did not notch.  During the anterior chamfer cut, there was a central section of bone that I made 2 passes with the soft, and there was a small section that was not fully cut and during removal of the chamfer cut it pulled away a central ridge of bone.  Most of this was removed with the box cut, however just as an example of how bad her bone was, the chamfer cut avulsed some central bone.  For this reason, I went with a size 5 on the tibia, a 4 would have been slightly undersized, the 5 gave me the optimal cortical support.  I do not think I was overhanging, but I definitely wanted all of the cortical support that I could achieve.  The patella had severe wear, and measured 20 before the cut, and I did not achieve removal of the lateral facet because it was already recessed probably to 10 mm or so.  I did fill this in with cement.  ESTIMATED BLOOD LOSS: 150 mL  OPERATIVE DESCRIPTION:  The patient was brought to the operative room and placed in a supine position.  Anesthesia was administered.  IV antibiotics were given.  The lower extremity was prepped and draped in the usual sterile fashion.  Time  out was performed.  The leg was elevated and exsanguinated and the tourniquet was inflated.  Anterior quadriceps tendon splitting approach was performed.  The patella was everted and osteophytes were removed.  The anterior horn of the medial and lateral meniscus was removed.   The patella was then measured, and cut with the saw.  The thickness before the cut was 20 and after the cut was 14.  A metal shield was used to protect the patella throughout the case.     The distal femur was opened with the drill and the intramedullary distal femoral cutting jig was utilized, set at 5 degrees resecting 9 mm off the distal femur.  Care was taken to protect the collateral ligaments.  Then the extramedullary tibial cutting jig was utilized making the appropriate cut using the anterior tibial crest as a reference building in appropriate posterior slope.  Care was taken during the cut to protect the medial and collateral ligaments.  The proximal tibia was removed along with the posterior horns of the menisci.  The PCL was sacrificed.    The extensor gap was measured and found to have adequate resection, measuring to a size 5.    The distal femoral sizing jig was applied, taking care to avoid notching.  This was set at 3 degrees of external rotation.  Then the 4-in-1 cutting jig was applied and the anterior and posterior femur was cut, along with the chamfer cuts.  All posterior osteophytes were removed.  The flexion gap was then measured and was symmetric with the extension gap.  I completed the distal femoral preparation using the appropriate jig to prepare the box.  The proximal tibia sized and prepared accordingly with the reamer and the punch, and then all components were trialed with the poly insert.  The knee was found to have excellent balance and full motion.    The above named components were then cemented into place and all excess cement was removed.  The real polyethylene implant was placed.  After the cement had cured I released the tourniquet and confirmed excellent hemostasis with no major posterior vessel injury.    The knee was easily taken through a range of motion and the patella tracked well and the knee irrigated copiously and the parapatellar and subcutaneous tissue closed with vicryl, and monocryl with steri strips for the skin.  The wounds were injected with marcaine, and dressed with sterile gauze and the patient was awakened and returned to the  PACU in stable and satisfactory condition.  There were no complications.  Total tourniquet time was approximately 80 minutes.

## 2024-02-06 NOTE — Anesthesia Procedure Notes (Signed)
Procedure Name: MAC Date/Time: 02/06/2024 7:27 AM  Performed by: Floydene Flock, CRNAPre-anesthesia Checklist: Patient identified, Emergency Drugs available, Suction available and Patient being monitored Patient Re-evaluated:Patient Re-evaluated prior to induction Oxygen Delivery Method: Simple face mask Preoxygenation: Pre-oxygenation with 100% oxygen Placement Confirmation: positive ETCO2

## 2024-02-06 NOTE — Discharge Instructions (Signed)
Diet: As you were doing prior to hospitalization   Shower:  May shower but keep the wounds dry, use an occlusive plastic wrap, NO SOAKING IN TUB.  If the bandage gets wet, change with a clean dry gauze.  If you have a splint on, leave the splint in place and keep the splint dry with a plastic bag.  Dressing:  You may change your dressing 3-5 days after surgery, unless you have a splint.  If you have a splint, then just leave the splint in place and we will change your bandages during your first follow-up appointment.    If you had hand or foot surgery, we will plan to remove your stitches in about 2 weeks in the office.  For all other surgeries, there are sticky tapes (steri-strips) on your wounds and all the stitches are absorbable.  Leave the steri-strips in place when changing your dressings, they will peel off with time, usually 2-3 weeks.  Activity:  Increase activity slowly as tolerated, but follow the weight bearing instructions below.  The rules on driving is that you can not be taking narcotics while you drive, and you must feel in control of the vehicle.    Weight Bearing:   as tolerated.    To prevent constipation: you may use a stool softener such as -  Colace (over the counter) 100 mg by mouth twice a day  Drink plenty of fluids (prune juice may be helpful) and high fiber foods Miralax (over the counter) for constipation as needed.    Itching:  If you experience itching with your medications, try taking only a single pain pill, or even half a pain pill at a time.  You may take up to 10 pain pills per day, and you can also use benadryl over the counter for itching or also to help with sleep.   Precautions:  If you experience chest pain or shortness of breath - call 911 immediately for transfer to the hospital emergency department!!  If you develop a fever greater that 101 F, purulent drainage from wound, increased redness or drainage from wound, or calf pain -- Call the office at  [phone]                                                Follow- Up Appointment:  Please call for an appointment to be seen in 2 weeks [place] - [phone]

## 2024-02-06 NOTE — Evaluation (Addendum)
Physical Therapy Evaluation Patient Details Name: [name] MRN: [id number] DOB: [birth year] Today's Date: 02/06/2024  History of Present Illness  78 yo female presents to therapy s/p R TKA on 02/06/2024 due to failure of conservative measures. Pt PMH includes but is not limited to: anxiety, asthma, dyspnea, GERD, hernia, dysrhythmia, PE, DM II, paralysis, neck surgery, and L TKA (2013).  Clinical Impression     [name] is a 78 y.o. female POD 0 s/p R TKA. Patient reports mod I with mobility at baseline. Patient is now limited by functional impairments (see PT problem list below) and requires CGA for supine to sit and mod A for sit to supine for B LE for bed mobility and min A for transfers. Patient was unable to safely ambulate at time of eval due to reports of light headedness, nausea and noted systemic tremors. PT unable to obtain accurate BP seated or in standing due to tremors. Patient instructed in exercise to facilitate ROM and circulation to manage edema in supine only. Patient will benefit from continued skilled PT interventions to address impairments and progress towards PLOF. Acute PT will follow to progress mobility and stair training in preparation for safe discharge home with family support and [facility] services with anticipation of transitioning to OP.       If plan is discharge home, recommend the following: A little help with walking and/or transfers;A little help with bathing/dressing/bathroom;Assistance with cooking/housework;Assist for transportation   Can travel by private vehicle        Equipment Recommendations None recommended by PT  Recommendations for Other Services       Functional Status Assessment Patient has had a recent decline in their functional status and demonstrates the ability to make significant improvements in function in a reasonable and predictable amount of time.     Precautions / Restrictions Precautions Precautions:  Knee;Fall Restrictions Weight Bearing Restrictions Per Provider Order: No      Mobility  Bed Mobility Overal bed mobility: Needs Assistance Bed Mobility: Supine to Sit, Sit to Supine     Supine to sit: Contact guard, HOB elevated, Used rails Sit to supine: Mod assist   General bed mobility comments: bed mobility performed x 2 due to circumstances with eval and transitioning from stretcher to hospital bed, pt required increased time for supine to sit, cues and HOB elevated with rails and mod A for B LE for sit to supine with minimal effort noted to manage L LE    Transfers Overall transfer level: Needs assistance Equipment used: Rolling walker (2 wheels) Transfers: Sit to/from Stand, Bed to chair/wheelchair/BSC Sit to Stand: Min assist Stand pivot transfers: Min assist         General transfer comment: min cues for sit to stand from EOB, once in standing pt reported feeling light headed, noted systemic tremors with pt reporting typically B UE only and nausea, pt returned to bed and nursing made aware pt reported she wanted to stay over night and feels like she might fall if she goes home and is not steady or IND currently, pt required min A with RW for SPT from stretcher to hospital bed with cues and increased time no apparent change in pain report, nausea or dizzines no over LOB or R LE instabilty noted    Ambulation/Gait               General Gait Details: NT  Stairs            Wheelchair  Mobility     Tilt Bed    Modified Rankin (Stroke Patients Only)       Balance Overall balance assessment: Needs assistance (pt reports poor balance and spouse assisting pt up off the floor but reprots has been a while > 6 months) Sitting-balance support: Feet supported Sitting balance-Leahy Scale: Good     Standing balance support: Bilateral upper extremity supported, During functional activity, Reliant on assistive device for balance Standing balance-Leahy Scale:  Poor                               Pertinent Vitals/Pain Pain Assessment Pain Assessment: 0-10 Pain Score: 6  Pain Location: R knee, espeically the back Pain Descriptors / Indicators: Aching, Constant, Discomfort, Dull, Grimacing, Operative site guarding Pain Intervention(s): Limited activity within patient's tolerance, Monitored during session, Premedicated before session, Repositioned, Ice applied    Home Living Family/patient expects to be discharged to:: Private residence Living Arrangements: Spouse/significant other Available Help at Discharge: Family Type of Home: House Home Access: Ramped entrance       Home Layout: One level Home Equipment: Agricultural consultant (2 wheels);Rollator (4 wheels);Standard Walker;Tub bench;Cane - single point;Wheelchair - manual;BSC/3in1      Prior Function Prior Level of Function : Needs assist       Physical Assist : Mobility (physical) Mobility (physical): Transfers   Mobility Comments: occational assist for transfer tasks, pt mobile in home with use of rollator ADLs Comments: mod I with use of rollator for all ADLs and self care tasks     Extremity/Trunk Assessment        Lower Extremity Assessment Lower Extremity Assessment: RLE deficits/detail RLE Deficits / Details: ankle DF 5/5, PF 4/5; SLR , 10 degree lag-- limited RLE Sensation: WNL    Cervical / Trunk Assessment Cervical / Trunk Assessment: Neck Surgery  Communication   Communication Communication: No apparent difficulties    Cognition Arousal: Alert Behavior During Therapy: WFL for tasks assessed/performed   PT - Cognitive impairments: No apparent impairments                         Following commands: Intact       Cueing       General Comments      Exercises Total Joint Exercises Ankle Circles/Pumps: AROM, Both, 10 reps Quad Sets: AROM, Right, 5 reps Short Arc Quad: AROM, Right, 5 reps Heel Slides: AROM, Right, 5 reps Hip  ABduction/ADduction: AROM, Right, 5 reps Straight Leg Raises: AROM, Right, 5 reps   Assessment/Plan    PT Assessment Patient needs continued PT services  PT Problem List Decreased strength;Decreased range of motion;Decreased activity tolerance;Decreased balance;Decreased mobility;Decreased coordination;Pain       PT Treatment Interventions DME instruction;Gait training;Functional mobility training;Therapeutic activities;Therapeutic exercise;Balance training;Neuromuscular re-education;Patient/family education;Modalities    PT Goals (Current goals can be found in the Care Plan section)  Acute Rehab PT Goals Patient Stated Goal: do what ever I want, take care of my sister and return to volunteer position at church PT Goal Formulation: With patient Time For Goal Achievement: 02/20/24 Potential to Achieve Goals: Good    Frequency 7X/week     Co-evaluation               AM-PAC PT "6 Clicks" Mobility  Outcome Measure Help needed turning from your back to your side while in a flat bed without using bedrails?: A Little Help needed moving  from lying on your back to sitting on the side of a flat bed without using bedrails?: A Little Help needed moving to and from a bed to a chair (including a wheelchair)?: A Little Help needed standing up from a chair using your arms (e.g., wheelchair or bedside chair)?: A Little Help needed to walk in hospital room?: Total Help needed climbing 3-5 steps with a railing? : Total 6 Click Score: 14    End of Session Equipment Utilized During Treatment: Gait belt Activity Tolerance: Patient limited by pain;Treatment limited secondary to medical complications (Comment) (tremors, light headedness and nausea) Patient left: in bed;with call bell/phone within reach;with family/visitor present Nurse Communication: Mobility status PT Visit Diagnosis: Unsteadiness on feet (R26.81);Other abnormalities of gait and mobility (R26.89);Muscle weakness (generalized)  (M62.81);Difficulty in walking, not elsewhere classified (R26.2);Pain Pain - Right/Left: Right Pain - part of body: Knee;Leg    Time: 7829-5621 PT Time Calculation (min) (ACUTE ONLY): 59 min   Charges:   PT Evaluation $PT Eval Low Complexity: 1 Low PT Treatments $Therapeutic Exercise: 8-22 mins $Therapeutic Activity: 23-37 mins PT General Charges $$ ACUTE PT VISIT: 1 Visit         Johnny Bridge, PT Acute Rehab   Jacqualyn Posey 02/06/2024, 5:53 PM

## 2024-02-06 NOTE — H&P (Signed)
PREOPERATIVE H&P  Chief Complaint: right knee pain  HPI: [name] is a 78 y.o. female who presents for preoperative history and physical with a diagnosis of right knee osteoarthritis. Symptoms are rated as moderate to severe, and have been worsening.  This is significantly impairing activities of daily living.  She has elected for surgical management.   She has failed injections, activity modification, anti-inflammatories, and assistive devices.  Preoperative X-rays demonstrate end stage degenerative changes with osteophyte formation, loss of joint space, subchondral sclerosis.   Past Medical History:  Diagnosis Date   Anxiety 12/29/2022   Arthritis    Asthma    Carpal tunnel syndrome    Depression    Dyspnea    Dysrhythmia    Gastric ulcer    GERD (gastroesophageal reflux disease)    H/O hiatal hernia    Heart murmur    since birth   History of heart murmur in childhood    History of kidney stones    Hypertension    Iron deficiency anemia    Neuropathy    Following neck surgery.   Paralysis (HCC)    PONV (postoperative nausea and vomiting)    Pulmonary embolus [facility])    November 2020   Type 2 diabetes mellitus [facility])    Past Surgical History:  Procedure Laterality Date   APPENDECTOMY     ATRIAL FIBRILLATION ABLATION N/A 10/09/2023   Procedure: ATRIAL FIBRILLATION ABLATION;  Surgeon: [name], MD;  Location: [facility] INVASIVE CV LAB;  Service: Cardiovascular;  Laterality: N/A;   BIOPSY  06/01/2022   Procedure: BIOPSY;  Surgeon: Malissa Hippo, MD;  Location: [facility];  Service: Endoscopy;;   CHOLECYSTECTOMY     COLONOSCOPY     COLONOSCOPY N/A 09/26/2013   Procedure: COLONOSCOPY;  Surgeon: Malissa Hippo, MD;  Location: [facility];  Service: Endoscopy;  Laterality: N/A;  1030-rescheduled to 12:00pm Ann notified pt   COLONOSCOPY N/A 03/27/2015   Procedure: COLONOSCOPY;  Surgeon: Malissa Hippo, MD;  Location: [facility];  Service: Endoscopy;  Laterality:  N/A;  11:15 - moved to 8:25 - Ann to notify pt   COLONOSCOPY WITH PROPOFOL N/A 06/01/2022   Procedure: COLONOSCOPY WITH PROPOFOL;  Surgeon: Malissa Hippo, MD;  Location: [facility];  Service: Endoscopy;  Laterality: N/A;  1005 ASA 2   CYSTOSCOPY/URETEROSCOPY/HOLMIUM LASER/STENT PLACEMENT Left 12/23/2022   Procedure: CYSTOSCOPY/ LEFT RETROGRADE/ URETEROSCOPY/HOLMIUM LASER/STENT PLACEMENT;  Surgeon: Jerilee Field, MD;  Location: [facility] ORS;  Service: Urology;  Laterality: Left;   ESOPHAGOGASTRODUODENOSCOPY  12/08/2011   Procedure: ESOPHAGOGASTRODUODENOSCOPY (EGD);  Surgeon: Malissa Hippo, MD;  [facility];  Service: Endoscopy;  Laterality: N/A;  3:00    ESOPHAGOGASTRODUODENOSCOPY N/A 03/27/2015   Procedure: ESOPHAGOGASTRODUODENOSCOPY (EGD);  Surgeon: Malissa Hippo, MD;  [facility];  Service: Endoscopy;  Laterality: N/A;   EYE SURGERY Bilateral    cataract surgery   GIVENS CAPSULE STUDY N/A 11/04/2015   Procedure: GIVENS CAPSULE STUDY;  Surgeon: Malissa Hippo, MD;  [facility];  Service: Endoscopy;  Laterality: N/A;   Hysterscopy     KNEE ARTHROSCOPY     Left   NECK SURGERY     x 2    TONSILLECTOMY     TOTAL KNEE ARTHROPLASTY  07/11/2012   Procedure: TOTAL KNEE ARTHROPLASTY;  Surgeon: Loreta Ave, MD;  Location[facility] OR;  Service: Orthopedics;  Laterality: Left;  left total knee arthroplasty   UPPER GASTROINTESTINAL ENDOSCOPY  YAG LASER APPLICATION Right 02/21/2017   Procedure: YAG LASER APPLICATION;  Surgeon: Jethro Bolus, MD;  Location: [facility];  Service: Ophthalmology;  Laterality: Right;   Social History   Socioeconomic History   Marital status: Married    Spouse name: Not on file   Number of children: Not on file   Years of education: Not on file   Highest education level: Not on file  Occupational History   Not on file  Tobacco Use   Smoking status: Never    Passive exposure: Never   Smokeless tobacco: Never  Vaping Use   Vaping  status: Never Used  Substance and Sexual Activity   Alcohol use: No    Alcohol/week: 0.0 standard drinks of alcohol   Drug use: No   Sexual activity: Not Currently    Birth control/protection: Post-menopausal  Other Topics Concern   Not on file  Social History Narrative   Not on file   Social Drivers of Health   Financial Resource Strain: Low Risk  (06/25/2021)   Overall Financial Resource Strain (CARDIA)    Difficulty of Paying Living Expenses: Not hard at all  Food Insecurity: No Food Insecurity (06/25/2021)   Hunger Vital Sign    Worried About Running Out of Food in the Last Year: Never true    Ran Out of Food in the Last Year: Never true  Transportation Needs: No Transportation Needs (06/25/2021)   PRAPARE - Administrator, Civil Service (Medical): No    Lack of Transportation (Non-Medical): No  Physical Activity: Insufficiently Active (06/25/2021)   Exercise Vital Sign    Days of Exercise per Week: 2 days    Minutes of Exercise per Session: 20 min  Stress: No Stress Concern Present (06/25/2021)   [employer] of Occupational Health - Occupational Stress Questionnaire    Feeling of Stress : Not at all  Social Connections: Unknown (05/09/2022)   Received from [facility]   Social Network    Social Network: Not on file   Family History  Problem Relation Age of Onset   Alzheimer's disease Mother    Kidney disease Mother    Kidney disease Father    Diabetes Maternal Grandmother    Diabetes Maternal Grandfather    Cancer Paternal Grandmother        breast, liver   Hypertension Son    Cancer Maternal Aunt        leukemia   Liver disease Maternal Uncle    Kidney disease Paternal Uncle        kidney removed   Colon cancer Neg Hx    Allergies  Allergen Reactions   Nsaids Other (See Comments)    Caused Ulcers.   Cephalexin     unknown   Darvocet [Propoxyphene N-Acetaminophen] Nausea Only   Percocet [Oxycodone-Acetaminophen] Nausea Only    Penicillins Rash and Other (See Comments)    Has patient had a PCN reaction causing immediate rash, facial/tongue/throat swelling, SOB or lightheadedness with hypotension: Yes Has patient had a PCN reaction causing severe rash involving mucus membranes or skin necrosis: No Has patient had a PCN reaction that required hospitalization No Has patient had a PCN reaction occurring within the last 10 years: Yes If all of the above answers are "NO", then may proceed with Cephalosporin use.    Prior to Admission medications   Medication Sig Start Date End Date Taking? Authorizing Provider  acetaminophen (TYLENOL) 500 MG tablet Take 500 mg by mouth as needed  for moderate pain (pain score 4-6) or headache.   Yes [provider]  cholecalciferol (VITAMIN D3) 25 MCG (1000 UNIT) tablet Take 1,000 Units by mouth daily.   Yes [provider]  cyanocobalamin (VITAMIN B12) 1000 MCG tablet Take 1,000 mcg by mouth daily.   Yes [provider]  diazepam (VALIUM) 5 MG tablet Take 5 mg by mouth every 8 (eight) hours as needed for anxiety.   Yes [provider]  Elastic Bandages & Supports (MEDICAL COMPRESSION STOCKINGS) MISC 1 each by Does not apply route as directed. Knew High Medium Pressure Compression stockings Dx: bilateral lower extremity edema 10/18/23  Yes Mallipeddi, Vishnu P, MD  ferrous sulfate 325 (65 FE) MG tablet Take 1 tablet (325 mg total) by mouth daily with breakfast. 10/25/16  Yes Rehman, Joline Maxcy, MD  furosemide (LASIX) 20 MG tablet TAKE 1 TABLET BY MOUTH TWICE DAILY.TAKE AN ADDITIONAL TABLET DAILY AS NEEDED FOR SHORTNESS OF BREATH 01/08/24  Yes Mallipeddi, Vishnu P, MD  gabapentin (NEURONTIN) 300 MG capsule Take 300 mg by mouth at bedtime.   Yes [provider]  HYDROcodone-acetaminophen (NORCO/VICODIN) 5-325 MG tablet Take 1 tablet by mouth every 8 (eight) hours as needed for moderate pain (pain score 4-6). 01/05/24  Yes [provider]   metFORMIN (GLUCOPHAGE) 500 MG tablet Take 500 mg by mouth 2 (two) times daily with a meal.    Yes [provider]  montelukast (SINGULAIR) 10 MG tablet Take 10 mg by mouth every morning.   Yes [provider]  NON FORMULARY Pt uses a cpap   Yes [provider]  omeprazole (PRILOSEC) 20 MG capsule Take 20 mg by mouth 2 (two) times daily before a meal.   Yes [provider]  Phenazopyrid-Cranbry-C-Probiot (AZO URINARY TRACT SUPPORT PO) Take 2 tablets by mouth daily.   Yes [provider]  Probiotic Product (PROBIOTIC DAILY PO) Take 1 capsule by mouth daily.   Yes [provider]  rivaroxaban (XARELTO) 20 MG TABS tablet Take 20 mg by mouth daily with supper.   Yes [provider]  Turmeric 450 MG CAPS Take 450 mg by mouth daily.   Yes [provider]  albuterol (VENTOLIN HFA) 108 (90 Base) MCG/ACT inhaler Inhale 1-2 puffs into the lungs every 4 (four) hours as needed for wheezing or shortness of breath. 09/05/23   [provider]  KLOR-CON M20 20 MEQ tablet TAKE 1 TABLET BY MOUTH EVERY DAY 01/22/24   Mallipeddi, Vishnu P, MD     Positive ROS: All other systems have been reviewed and were otherwise negative with the exception of those mentioned in the HPI and as above.  Physical Exam: General: Alert, no acute distress Cardiovascular: No pedal edema Respiratory: No cyanosis, no use of accessory musculature GI: No organomegaly, abdomen is soft and non-tender Skin: No lesions in the area of chief complaint Neurologic: Sensation intact distally Psychiatric: Patient is competent for consent with normal mood and affect Lymphatic: No axillary or cervical lymphadenopathy  MUSCULOSKELETAL: Right knee with crepitance and range of motion from 5 degrees to 100 degrees.  Positive painful arc.  Assessment: Right knee osteoarthritis   Plan: Plan for Procedure(s): TOTAL KNEE ARTHROPLASTY  The risks benefits and  alternatives were discussed with the patient including but not limited to the risks of nonoperative treatment, versus surgical intervention including infection, bleeding, nerve injury,  blood clots, cardiopulmonary complications, morbidity, mortality, among others, and they were willing to proceed.   Her allergy was to  penicillin that she got during a colonoscopy, and she said her skin turned red, however she has never had an allergy to cephalosporin.  Plan for Ancef with a test dose.   Patient's anticipated LOS is less than 2 midnights, meeting these requirements: - Younger than 75 - Lives within 1 hour of care - Has a competent adult at home to recover with post-op recover - NO history of  - Chronic pain requiring opiods  - Diabetes  - Coronary Artery Disease  - Heart failure  - Heart attack  - Stroke  - DVT/VTE  - Cardiac arrhythmia  - Respiratory Failure/COPD  - Renal failure  - Anemia  - Advanced Liver disease      Eulas Post, MD Cell [phone]   02/06/2024 7:18 AM

## 2024-02-06 NOTE — Anesthesia Postprocedure Evaluation (Signed)
Anesthesia Post Note  Patient: [name]  Procedure(s) Performed: TOTAL KNEE ARTHROPLASTY (Right: Knee)     Patient location during evaluation: PACU Anesthesia Type: Regional, MAC and Spinal Level of consciousness: awake and alert and oriented Pain management: pain level controlled Vital Signs Assessment: post-procedure vital signs reviewed and stable Respiratory status: spontaneous breathing, nonlabored ventilation and respiratory function stable Cardiovascular status: blood pressure returned to baseline and stable Postop Assessment: no headache, no backache and spinal receding Anesthetic complications: no   No notable events documented.  Last Vitals:  Vitals:   02/06/24 1030 02/06/24 1045  BP: (!) 141/78 (!) 151/90  Pulse: 88 92  Resp: (!) 21 (!) 21  Temp:    SpO2: 100% 91%    Last Pain:  Vitals:   02/06/24 1045  TempSrc:   PainSc: 0-No pain                 Lannie Fields

## 2024-02-06 NOTE — Anesthesia Procedure Notes (Signed)
Spinal  Patient location during procedure: OR Start time: 02/06/2024 7:28 AM End time: 02/06/2024 7:30 AM Reason for block: surgical anesthesia Staffing Performed: anesthesiologist  Anesthesiologist: Lannie Fields, DO Performed by: Lannie Fields, DO Authorized by: Lannie Fields, DO   Preanesthetic Checklist Completed: patient identified, IV checked, risks and benefits discussed, surgical consent, monitors and equipment checked, pre-op evaluation and timeout performed Spinal Block Patient position: sitting Prep: DuraPrep and site prepped and draped Patient monitoring: cardiac monitor, continuous pulse ox and blood pressure Approach: midline Location: L3-4 Injection technique: single-shot Needle Needle type: Pencan  Needle gauge: 24 G Needle length: 9 cm Assessment Sensory level: T6 Events: CSF return Additional Notes Functioning IV was confirmed and monitors were applied. Sterile prep and drape, including hand hygiene and sterile gloves were used. The patient was positioned and the spine was prepped. The skin was anesthetized with lidocaine.  Free flow of clear CSF was obtained prior to injecting local anesthetic into the CSF.  The spinal needle aspirated freely following injection.  The needle was carefully withdrawn.  The patient tolerated the procedure well.

## 2024-02-06 NOTE — Transfer of Care (Signed)
Immediate Anesthesia Transfer of Care Note  Patient: [name]  Procedure(s) Performed: TOTAL KNEE ARTHROPLASTY (Right: Knee)  Patient Location: PACU  Anesthesia Type:Regional and Spinal  Level of Consciousness: drowsy  Airway & Oxygen Therapy: Patient Spontanous Breathing and Patient connected to face mask oxygen  Post-op Assessment: Report given to RN and Post -op Vital signs reviewed and stable  Post vital signs: Reviewed and stable  Last Vitals:  Vitals Value Taken Time  BP 135/70 02/06/24 1017  Temp    Pulse 90 02/06/24 1020  Resp 16   SpO2 99 % 02/06/24 1020  Vitals shown include unfiled device data.  Last Pain:  Vitals:   02/06/24 0619  TempSrc: Oral  PainSc: 0-No pain      Patients Stated Pain Goal: 4 (02/06/24 1610)  Complications: No notable events documented.

## 2024-02-07 ENCOUNTER — Encounter (HOSPITAL_COMMUNITY): Payer: Self-pay | Admitting: Orthopedic Surgery

## 2024-02-07 DIAGNOSIS — M1711 Unilateral primary osteoarthritis, right knee: Secondary | ICD-10-CM | POA: Diagnosis not present

## 2024-02-07 NOTE — TOC Transition Note (Signed)
Transition of Care [facility]) - Discharge Note   Patient Details  Name: [name] MRN: [id number] Date of Birth: 05-01-1946  Transition of [facility]) CM/SW Contact:  Amada Jupiter, LCSW Phone Number: 02/07/2024, 10:00 AM   Clinical Narrative:     Met with pt who confirms she has needed DME in the home.  HHPT already arranged with [facility] via ortho MD office prior to surgery.  No further TOC needs.  Final next level of care: Home w Home Health Services Barriers to Discharge: No Barriers Identified   Patient Goals and CMS Choice Patient states their goals for this hospitalization and ongoing recovery are:: return home          Discharge Placement                       Discharge Plan and Services Additional resources added to the After Visit Summary for                  DME Arranged: N/A DME Agency: NA       HH Arranged: PT HH Agency: [facility]        Social Drivers of Health (SDOH) Interventions SDOH Screenings   Food Insecurity: No Food Insecurity (02/06/2024)  Housing: Low Risk  (02/06/2024)  Transportation Needs: No Transportation Needs (02/06/2024)  Utilities: Not At Risk (02/06/2024)  Alcohol Screen: Low Risk  (06/25/2021)  Depression (PHQ2-9): Low Risk  (06/25/2021)  Financial Resource Strain: Low Risk  (06/25/2021)  Physical Activity: Insufficiently Active (06/25/2021)  Social Connections: Socially Integrated (02/06/2024)  Stress: No Stress Concern Present (06/25/2021)  Tobacco Use: Low Risk  (02/06/2024)     Readmission Risk Interventions     No data to display

## 2024-02-07 NOTE — Progress Notes (Signed)
Physical Therapy Treatment Patient Details Name: [name] MRN: [id number] DOB: 06/29/1946 Today's Date: 02/07/2024   History of Present Illness 78 yo female presents to therapy s/p R TKA on 02/05/2024 due to failure of conservative measures. Pt PMH includes but is not limited to: anxiety, asthma, dyspnea, GERD, hernia, dysrhythmia, PE, DM II, paralysis, neck surgery, and L TKA (2013).    PT Comments  Reviewed HEP with pt/spouse, they demonstrate good understanding. Pt reports she's too fatigued to ambulate as she just walked to the bathroom prior to PT session. From a PT standpoint, she is ready to DC home.     If plan is discharge home, recommend the following: A little help with walking and/or transfers;A little help with bathing/dressing/bathroom;Assistance with cooking/housework;Assist for transportation;Help with stairs or ramp for entrance   Can travel by private vehicle        Equipment Recommendations  None recommended by PT    Recommendations for Other Services       Precautions / Restrictions Precautions Precautions: Knee;Fall Precaution Booklet Issued: Yes (comment) Recall of Precautions/Restrictions: Intact Precaution/Restrictions Comments: reviewed no pillow under knee Restrictions Weight Bearing Restrictions Per Provider Order: No     Mobility  Bed Mobility      General bed mobility comments: up in recliner       Stairs             Wheelchair Mobility     Tilt Bed    Modified Rankin (Stroke Patients Only)          Communication Communication Communication: No apparent difficulties  Cognition Arousal: Alert Behavior During Therapy: WFL for tasks assessed/performed   PT - Cognitive impairments: No apparent impairments                         Following commands: Intact      Cueing    Exercises Total Joint Exercises Ankle Circles/Pumps: AROM, Both, 10 reps Quad Sets: AROM, Right, 5 reps Short Arc Quad: AROM, Right,  5 reps, Supine Heel Slides: Right, 5 reps, Supine, AAROM Hip ABduction/ADduction: AROM, Right, 5 reps, Supine Straight Leg Raises: Right, 5 reps, AAROM, Supine Long Arc Quad: AROM, Right, 5 reps, Seated Knee Flexion: AAROM, Right, 5 reps, Seated Goniometric ROM: ~0-70* AAROM R knee    General Comments        Pertinent Vitals/Pain Pain Assessment Pain Score: 5  Pain Location: R posterior knee Pain Descriptors / Indicators: Aching, Constant, Discomfort, Dull, Grimacing, Operative site guarding Pain Intervention(s): Limited activity within patient's tolerance, Monitored during session, Premedicated before session, Ice applied    Home Living                          Prior Function            PT Goals (current goals can now be found in the care plan section) Acute Rehab PT Goals Patient Stated Goal: do what ever I want, take care of my sister and return to volunteer position at church PT Goal Formulation: With patient Time For Goal Achievement: 02/20/24 Potential to Achieve Goals: Good Progress towards PT goals: Progressing toward goals    Frequency    7X/week      PT Plan      Co-evaluation              AM-PAC PT "6 Clicks" Mobility   Outcome Measure  Help needed turning from your back  to your side while in a flat bed without using bedrails?: A Little Help needed moving from lying on your back to sitting on the side of a flat bed without using bedrails?: A Little Help needed moving to and from a bed to a chair (including a wheelchair)?: None Help needed standing up from a chair using your arms (e.g., wheelchair or bedside chair)?: None Help needed to walk in hospital room?: None Help needed climbing 3-5 steps with a railing? : A Little 6 Click Score: 21    End of Session Equipment Utilized During Treatment: Gait belt Activity Tolerance: Patient tolerated treatment well Patient left: in chair;with call bell/phone within reach;with family/visitor  present Nurse Communication: Mobility status PT Visit Diagnosis: Other abnormalities of gait and mobility (R26.89);Muscle weakness (generalized) (M62.81);Difficulty in walking, not elsewhere classified (R26.2);Pain Pain - Right/Left: Right Pain - part of body: Knee     Time: 9147-8295 PT Time Calculation (min) (ACUTE ONLY): 15 min  Charges:     $Therapeutic Exercise: 8-22 mins PT General Charges $$ ACUTE PT VISIT: 1 Visit                     Tamala Ser PT 02/07/2024  Acute Rehabilitation Services  Office [phone]

## 2024-02-07 NOTE — Care Management Obs Status (Signed)
MEDICARE OBSERVATION STATUS NOTIFICATION   Patient Details  Name: [name] MRN: [id number] Date of Birth: 1946-03-11   Medicare Observation Status Notification Given:  Yes    Amada Jupiter, LCSW 02/07/2024, 10:00 AM

## 2024-02-07 NOTE — Progress Notes (Signed)
Spoke with patient, ended up staying overnight for pain control.  Plan to work with PT today, and dc home if cleared.  Resume xarelto, oxycodone authorized with her pharmacy.  Eulas Post, MD

## 2024-02-07 NOTE — Discharge Summary (Signed)
Physician Discharge Summary  Patient ID: [name] MRN: [id number] DOB/AGE: 1946-04-05 78 y.o.  Admit date: 02/06/2024 Discharge date: 02/07/2024  Admission Diagnoses:  Primary localized osteoarthritis of right knee  Discharge Diagnoses:  Principal Problem:   Primary localized osteoarthritis of right knee Active Problems:   S/P TKR (total knee replacement), right   Past Medical History:  Diagnosis Date   Anxiety 12/29/2022   Arthritis    Asthma    Carpal tunnel syndrome    Depression    Dyspnea    Dysrhythmia    Gastric ulcer    GERD (gastroesophageal reflux disease)    H/O hiatal hernia    Heart murmur    since birth   History of heart murmur in childhood    History of kidney stones    Hypertension    Iron deficiency anemia    Neuropathy    Following neck surgery.   Paralysis (HCC)    PONV (postoperative nausea and vomiting)    Primary localized osteoarthritis of right knee 02/06/2024   Pulmonary embolus [facility])    November 2020   Type 2 diabetes mellitus [facility])     Surgeries: Procedure(s): TOTAL KNEE ARTHROPLASTY on 02/06/2024   Consultants (if any):   Discharged Condition: Improved  Hospital Course: [name] is an 78 y.o. female who was admitted 02/06/2024 with a diagnosis of Primary localized osteoarthritis of right knee and went to the operating room on 02/06/2024 and underwent the above named procedures.    She was given perioperative antibiotics:  Anti-infectives (From admission, onward)    Start     Dose/Rate Route Frequency Ordered Stop   02/06/24 1400  ceFAZolin (ANCEF) IVPB 2g/100 mL premix        2 g 200 mL/hr over 30 Minutes Intravenous Every 6 hours 02/06/24 1013 02/06/24 2129   02/06/24 1355  ceFAZolin (ANCEF) 2-4 GM/100ML-% IVPB       Note to Pharmacy: Roel Cluck A: cabinet override      02/06/24 1355 02/07/24 0159   02/06/24 0800  levofloxacin (LEVAQUIN) IVPB 500 mg  Status:  Discontinued        500 mg 100 mL/hr over 60 Minutes  Intravenous On call to O.R. 02/06/24 0747 02/06/24 1658   02/06/24 0800  vancomycin (VANCOCIN) IVPB 1000 mg/200 mL premix  Status:  Discontinued        1,000 mg 200 mL/hr over 60 Minutes Intravenous On call to O.R. 02/06/24 0747 02/06/24 1658   02/06/24 0730  ceFAZolin (ANCEF) IVPB 2g/100 mL premix  Status:  Discontinued        2 g 200 mL/hr over 30 Minutes Intravenous  Once 02/06/24 0721 02/06/24 0748   02/06/24 0600  levofloxacin (LEVAQUIN) IVPB 500 mg  Status:  Discontinued        500 mg 100 mL/hr over 60 Minutes Intravenous On call to O.R. 02/06/24 0557 02/06/24 0720   02/06/24 0600  vancomycin (VANCOCIN) IVPB 1000 mg/200 mL premix  Status:  Discontinued        1,000 mg 200 mL/hr over 60 Minutes Intravenous On call to O.R. 02/06/24 7829 02/06/24 0720     .  She was given sequential compression devices, early ambulation, and xarelto for DVT prophylaxis.  She benefited maximally from the hospital stay and there were no complications.    Recent vital signs:  Vitals:   02/07/24 0600 02/07/24 0903  BP: (!) 153/74 (!) 127/44  Pulse: 80 87  Resp: 15 17  Temp: (!) 97.5  F (36.4 C) 98.1 F (36.7 C)  SpO2: 99% 100%    Recent laboratory studies:  Lab Results  Component Value Date   HGB 14.3 01/25/2024   HGB 13.8 09/20/2023   HGB 12.1 03/13/2023   Lab Results  Component Value Date   WBC 9.7 01/25/2024   PLT 300 01/25/2024   Lab Results  Component Value Date   INR 0.99 11/15/2013   Lab Results  Component Value Date   NA 140 01/25/2024   K 4.1 01/25/2024   CL 103 01/25/2024   CO2 26 01/25/2024   BUN 14 01/25/2024   CREATININE 0.91 01/25/2024   GLUCOSE 118 (H) 01/25/2024    Discharge Medications:   Allergies as of 02/07/2024       Reactions   Nsaids Other (See Comments)   Caused Ulcers.   Cephalexin    unknown   Darvocet [propoxyphene N-acetaminophen] Nausea Only   Percocet [oxycodone-acetaminophen] Nausea Only   Penicillins Rash, Other (See Comments)   Has  patient had a PCN reaction causing immediate rash, facial/tongue/throat swelling, SOB or lightheadedness with hypotension: Yes Has patient had a PCN reaction causing severe rash involving mucus membranes or skin necrosis: No Has patient had a PCN reaction that required hospitalization No Has patient had a PCN reaction occurring within the last 10 years: Yes If all of the above answers are "NO", then may proceed with Cephalosporin use.        Medication List     STOP taking these medications    HYDROcodone-acetaminophen 5-325 MG tablet Commonly known as: NORCO/VICODIN       TAKE these medications    acetaminophen 500 MG tablet Commonly known as: TYLENOL Take 500 mg by mouth as needed for moderate pain (pain score 4-6) or headache.   albuterol 108 (90 Base) MCG/ACT inhaler Commonly known as: VENTOLIN HFA Inhale 1-2 puffs into the lungs every 4 (four) hours as needed for wheezing or shortness of breath.   AZO URINARY TRACT SUPPORT PO Take 2 tablets by mouth daily.   baclofen 10 MG tablet Commonly known as: LIORESAL Take 1 tablet (10 mg total) by mouth 3 (three) times daily. As needed for muscle spasm   cholecalciferol 25 MCG (1000 UNIT) tablet Commonly known as: VITAMIN D3 Take 1,000 Units by mouth daily.   cyanocobalamin 1000 MCG tablet Commonly known as: VITAMIN B12 Take 1,000 mcg by mouth daily.   diazepam 5 MG tablet Commonly known as: VALIUM Take 5 mg by mouth every 8 (eight) hours as needed for anxiety.   ferrous sulfate 325 (65 FE) MG tablet Take 1 tablet (325 mg total) by mouth daily with breakfast.   furosemide 20 MG tablet Commonly known as: LASIX TAKE 1 TABLET BY MOUTH TWICE DAILY.TAKE AN ADDITIONAL TABLET DAILY AS NEEDED FOR SHORTNESS OF BREATH   gabapentin 300 MG capsule Commonly known as: NEURONTIN Take 300 mg by mouth at bedtime.   Klor-Con M20 20 MEQ tablet Generic drug: potassium chloride SA TAKE 1 TABLET BY MOUTH EVERY DAY   Medical  Compression Stockings Misc 1 each by Does not apply route as directed. Knew High Medium Pressure Compression stockings Dx: bilateral lower extremity edema   metFORMIN 500 MG tablet Commonly known as: GLUCOPHAGE Take 500 mg by mouth 2 (two) times daily with a meal.   montelukast 10 MG tablet Commonly known as: SINGULAIR Take 10 mg by mouth every morning.   NON FORMULARY Pt uses a cpap   omeprazole 20 MG capsule Commonly known  as: PRILOSEC Take 20 mg by mouth 2 (two) times daily before a meal.   ondansetron 4 MG tablet Commonly known as: Zofran Take 1 tablet (4 mg total) by mouth every 8 (eight) hours as needed for nausea or vomiting.   oxyCODONE 5 MG immediate release tablet Commonly known as: Roxicodone Take 1 tablet (5 mg total) by mouth every 4 (four) hours as needed for severe pain (pain score 7-10).   PROBIOTIC DAILY PO Take 1 capsule by mouth daily.   rivaroxaban 20 MG Tabs tablet Commonly known as: XARELTO Take 20 mg by mouth daily with supper.   sennosides-docusate sodium 8.6-50 MG tablet Commonly known as: SENOKOT-S Take 2 tablets by mouth daily.   Turmeric 450 MG Caps Take 450 mg by mouth daily.        Diagnostic Studies: DG Knee Right Port Result Date: 02/06/2024 CLINICAL DATA:  Status post total knee replacement, right. EXAM: PORTABLE RIGHT KNEE - 1-2 VIEW COMPARISON:  None Available. FINDINGS: Right knee arthroplasty in expected alignment. No periprosthetic lucency or fracture. There has been patellar resurfacing. Recent postsurgical change includes air and edema in the soft tissues and joint space. Healed proximal fibular fracture. IMPRESSION: Right knee arthroplasty without immediate postoperative complication. Electronically Signed   By: Narda Rutherford M.D.   On: 02/06/2024 11:29    Disposition:      Follow-up Information     Teryl Lucy, MD. Go on 02/19/2024.   Specialty: Orthopedic Surgery Why: your appointment is scheduled for  3:45 Contact information: [address] [place] [address] [phone]         [facility] Follow up.   Why: HHPT will provide 6 home visits prior to starting outpatient  physical therapy        [facility]. Go on 02/20/2024.   Why: your outpatient physical therapy has been ordered. they will call you with a time Contact information: [address]  [place], [place]   [phone]                 Signed: Eulas Post 02/07/2024, 10:17 AM

## 2024-02-07 NOTE — Progress Notes (Signed)
Physical Therapy Treatment Patient Details Name: [name] MRN: [id number] DOB: 1946/07/24 Today's Date: 02/07/2024   History of Present Illness 78 yo female presents to therapy s/p R TKA on 02/05/2024 due to failure of conservative measures. Pt PMH includes but is not limited to: anxiety, asthma, dyspnea, GERD, hernia, dysrhythmia, PE, DM II, paralysis, neck surgery, and L TKA (2013).    PT Comments  Pt is progressing well with mobility, she ambulated 70' with RW, no loss of balance. She demonstrates good understanding of HEP. Pt would like 1 more PT session this afternoon, I expect she'll be ready to DC after that.     If plan is discharge home, recommend the following: A little help with walking and/or transfers;A little help with bathing/dressing/bathroom;Assistance with cooking/housework;Assist for transportation;Help with stairs or ramp for entrance   Can travel by private vehicle        Equipment Recommendations  None recommended by PT    Recommendations for Other Services       Precautions / Restrictions Precautions Precautions: Knee;Fall Precaution Booklet Issued: Yes (comment) Recall of Precautions/Restrictions: Intact Precaution/Restrictions Comments: reviewed no pillow under knee Restrictions Weight Bearing Restrictions Per Provider Order: No     Mobility  Bed Mobility Overal bed mobility: Modified Independent Bed Mobility: Supine to Sit     Supine to sit: Modified independent (Device/Increase time), HOB elevated, Used rails          Transfers Overall transfer level: Needs assistance Equipment used: Rolling walker (2 wheels) Transfers: Sit to/from Stand Sit to Stand: Supervision           General transfer comment: VCs for hand placement; STS x 2    Ambulation/Gait Ambulation/Gait assistance: Supervision Gait Distance (Feet): 80 Feet Assistive device: Rolling walker (2 wheels) Gait Pattern/deviations: Step-to pattern, Decreased step length -  left, Decreased step length - right Gait velocity: decr     General Gait Details: steady, no loss of balance, distance limited by pain   Stairs             Wheelchair Mobility     Tilt Bed    Modified Rankin (Stroke Patients Only)       Balance Overall balance assessment: Needs assistance Sitting-balance support: Feet supported Sitting balance-Leahy Scale: Good     Standing balance support: Bilateral upper extremity supported, During functional activity, Reliant on assistive device for balance Standing balance-Leahy Scale: Poor                              Communication Communication Communication: No apparent difficulties  Cognition Arousal: Alert Behavior During Therapy: WFL for tasks assessed/performed   PT - Cognitive impairments: No apparent impairments                         Following commands: Intact      Cueing    Exercises Total Joint Exercises Ankle Circles/Pumps: AROM, Both, 10 reps Quad Sets: AROM, Right, 5 reps Short Arc Quad: AROM, Right, 5 reps, Supine Heel Slides: Right, 5 reps, Supine, AAROM Hip ABduction/ADduction: AROM, Right, 5 reps, Supine Straight Leg Raises: Right, 5 reps, AAROM, Supine Long Arc Quad: AROM, Right, 5 reps, Seated Knee Flexion: AAROM, Right, 5 reps, Seated Goniometric ROM: ~0-70* AAROM R knee    General Comments        Pertinent Vitals/Pain Pain Assessment Pain Score: 7  Pain Location: R posterior knee Pain Descriptors /  Indicators: Aching, Constant, Discomfort, Dull, Grimacing, Operative site guarding Pain Intervention(s): Limited activity within patient's tolerance, Monitored during session, Premedicated before session, Ice applied, Repositioned    Home Living                          Prior Function            PT Goals (current goals can now be found in the care plan section) Acute Rehab PT Goals Patient Stated Goal: do what ever I want, take care of my sister and  return to volunteer position at church PT Goal Formulation: With patient Time For Goal Achievement: 02/20/24 Potential to Achieve Goals: Good Progress towards PT goals: Progressing toward goals    Frequency    7X/week      PT Plan      Co-evaluation              AM-PAC PT "6 Clicks" Mobility   Outcome Measure  Help needed turning from your back to your side while in a flat bed without using bedrails?: A Little Help needed moving from lying on your back to sitting on the side of a flat bed without using bedrails?: A Little Help needed moving to and from a bed to a chair (including a wheelchair)?: None Help needed standing up from a chair using your arms (e.g., wheelchair or bedside chair)?: None Help needed to walk in hospital room?: None Help needed climbing 3-5 steps with a railing? : A Little 6 Click Score: 21    End of Session Equipment Utilized During Treatment: Gait belt Activity Tolerance: Patient tolerated treatment well Patient left: in chair;with call bell/phone within reach;with family/visitor present Nurse Communication: Mobility status PT Visit Diagnosis: Other abnormalities of gait and mobility (R26.89);Muscle weakness (generalized) (M62.81);Difficulty in walking, not elsewhere classified (R26.2);Pain Pain - Right/Left: Right Pain - part of body: Knee     Time: 1610-9604 PT Time Calculation (min) (ACUTE ONLY): 37 min  Charges:    $Gait Training: 8-22 mins $Therapeutic Exercise: 8-22 mins PT General Charges $$ ACUTE PT VISIT: 1 Visit                     Ralene Bathe Kistler PT 02/07/2024  Acute Rehabilitation Services  Office [phone]

## 2024-02-08 ENCOUNTER — Other Ambulatory Visit: Payer: Self-pay

## 2024-02-08 ENCOUNTER — Encounter (HOSPITAL_COMMUNITY): Payer: Self-pay | Admitting: Emergency Medicine

## 2024-02-08 ENCOUNTER — Emergency Department (HOSPITAL_COMMUNITY)
Admission: EM | Admit: 2024-02-08 | Discharge: 2024-02-08 | Disposition: A | Discharge status: Home / Self Care | Payer: HMO | Attending: Emergency Medicine | Admitting: Emergency Medicine

## 2024-02-08 DIAGNOSIS — E119 Type 2 diabetes mellitus without complications: Secondary | ICD-10-CM | POA: Diagnosis not present

## 2024-02-08 DIAGNOSIS — G8918 Other acute postprocedural pain: Secondary | ICD-10-CM

## 2024-02-08 DIAGNOSIS — Z96651 Presence of right artificial knee joint: Secondary | ICD-10-CM | POA: Diagnosis not present

## 2024-02-08 DIAGNOSIS — I10 Essential (primary) hypertension: Secondary | ICD-10-CM | POA: Diagnosis not present

## 2024-02-08 DIAGNOSIS — M25561 Pain in right knee: Secondary | ICD-10-CM | POA: Diagnosis not present

## 2024-02-08 DIAGNOSIS — Z7901 Long term (current) use of anticoagulants: Secondary | ICD-10-CM | POA: Insufficient documentation

## 2024-02-08 LAB — CBC WITH DIFFERENTIAL/PLATELET
Abs Immature Granulocytes: 0.05 10*3/uL (ref 0.00–0.07)
Basophils Absolute: 0.1 10*3/uL (ref 0.0–0.1)
Basophils Relative: 1 %
Eosinophils Absolute: 0.2 10*3/uL (ref 0.0–0.5)
Eosinophils Relative: 2 %
HCT: 36.3 % (ref 36.0–46.0)
Hemoglobin: 11.8 g/dL — ABNORMAL LOW (ref 12.0–15.0)
Immature Granulocytes: 1 %
Lymphocytes Relative: 21 %
Lymphs Abs: 2.2 10*3/uL (ref 0.7–4.0)
MCH: 30.1 pg (ref 26.0–34.0)
MCHC: 32.5 g/dL (ref 30.0–36.0)
MCV: 92.6 fL (ref 80.0–100.0)
Monocytes Absolute: 1.1 10*3/uL — ABNORMAL HIGH (ref 0.1–1.0)
Monocytes Relative: 10 %
Neutro Abs: 7 10*3/uL (ref 1.7–7.7)
Neutrophils Relative %: 65 %
Platelets: 245 10*3/uL (ref 150–400)
RBC: 3.92 MIL/uL (ref 3.87–5.11)
RDW: 14.8 % (ref 11.5–15.5)
WBC: 10.6 10*3/uL — ABNORMAL HIGH (ref 4.0–10.5)
nRBC: 0 % (ref 0.0–0.2)

## 2024-02-08 LAB — COMPREHENSIVE METABOLIC PANEL
ALT: 34 U/L (ref 0–44)
AST: 31 U/L (ref 15–41)
Albumin: 3.6 g/dL (ref 3.5–5.0)
Alkaline Phosphatase: 61 U/L (ref 38–126)
Anion gap: 9 (ref 5–15)
BUN: 22 mg/dL (ref 8–23)
CO2: 27 mmol/L (ref 22–32)
Calcium: 9.4 mg/dL (ref 8.9–10.3)
Chloride: 105 mmol/L (ref 98–111)
Creatinine, Ser: 0.99 mg/dL (ref 0.44–1.00)
GFR, Estimated: 59 mL/min — ABNORMAL LOW (ref >60)
Glucose, Bld: 114 mg/dL — ABNORMAL HIGH (ref 70–99)
Potassium: 3.6 mmol/L (ref 3.5–5.1)
Sodium: 141 mmol/L (ref 135–145)
Total Bilirubin: 0.4 mg/dL (ref 0.0–1.2)
Total Protein: 6.3 g/dL — ABNORMAL LOW (ref 6.5–8.1)

## 2024-02-08 MED ORDER — NAPROXEN 375 MG PO TABS: 375.0000 mg | ORAL_TABLET | Freq: Two times a day (BID) | ORAL | 0 refills | Status: AC

## 2024-02-08 MED ORDER — ACETAMINOPHEN 500 MG PO TABS: 1000.0000 mg | ORAL_TABLET | Freq: Three times a day (TID) | ORAL | 0 refills | Status: AC

## 2024-02-08 MED ORDER — NALOXONE HCL 4 MG/0.1ML NA LIQD
1.0000 | Freq: Once | NASAL | Status: AC
  Administered 2024-02-08: 1 via NASAL
  Filled 2024-02-08: qty 4

## 2024-02-08 MED ORDER — HYDROMORPHONE HCL 1 MG/ML IJ SOLN
2.0000 mg | Freq: Once | INTRAMUSCULAR | Status: AC
  Administered 2024-02-08: 2 mg via INTRAMUSCULAR
  Filled 2024-02-08: qty 2

## 2024-02-08 MED ORDER — ACETAMINOPHEN 500 MG PO TABS
1000.0000 mg | ORAL_TABLET | Freq: Once | ORAL | Status: AC
  Administered 2024-02-08: 1000 mg via ORAL
  Filled 2024-02-08: qty 2

## 2024-02-08 MED ORDER — ONDANSETRON 8 MG PO TBDP
8.0000 mg | ORAL_TABLET | Freq: Once | ORAL | Status: AC
  Administered 2024-02-08: 8 mg via ORAL
  Filled 2024-02-08: qty 1

## 2024-02-08 MED ORDER — NALOXONE HCL 0.4 MG/ML IJ SOLN
0.4000 mg | Freq: Once | INTRAMUSCULAR | Status: AC
  Administered 2024-02-08: 0.4 mg via INTRAVENOUS
  Filled 2024-02-08: qty 1

## 2024-02-08 MED ORDER — KETOROLAC TROMETHAMINE 15 MG/ML IJ SOLN
15.0000 mg | Freq: Once | INTRAMUSCULAR | Status: AC
  Administered 2024-02-08: 15 mg via INTRAVENOUS
  Filled 2024-02-08: qty 1

## 2024-02-08 MED ORDER — ONDANSETRON 4 MG PO TBDP
4.0000 mg | ORAL_TABLET | Freq: Once | ORAL | Status: AC
  Administered 2024-02-08: 4 mg via ORAL
  Filled 2024-02-08: qty 1

## 2024-02-08 NOTE — ED Triage Notes (Signed)
Pt had right knee replacement surgery on Tuesday where she received a nerve block. Today around 1500, pt started to have pins and needles radiating down right leg.  Pt endorses pain in right leg.  Pt took oxycodone at 0300 this morning with no relief.  PMS intact.  Pt able to ambulate.

## 2024-02-08 NOTE — ED Provider Notes (Signed)
[place] EMERGENCY DEPARTMENT AT [facility] Provider Note   CSN: [id number] Arrival date & time: 02/08/24  0431     History  Chief Complaint  Patient presents with   Leg Pain    [name] is a 78 y.o. female.  The history is provided by the patient.  Leg Pain She has history of hypertension, diabetes, pulmonary embolism anticoagulated on rivaroxaban and is 2 days status post right knee total arthroplasty and comes in because of severe pain in the right knee not relieved by oxycodone at home.  She has been ambulatory.   Home Medications Prior to Admission medications   Medication Sig Start Date End Date Taking? Authorizing Provider  acetaminophen (TYLENOL) 500 MG tablet Take 500 mg by mouth as needed for moderate pain (pain score 4-6) or headache.    [provider]  albuterol (VENTOLIN HFA) 108 (90 Base) MCG/ACT inhaler Inhale 1-2 puffs into the lungs every 4 (four) hours as needed for wheezing or shortness of breath. 09/05/23   [provider]  baclofen (LIORESAL) 10 MG tablet Take 1 tablet (10 mg total) by mouth 3 (three) times daily. As needed for muscle spasm 02/06/24   Teryl Lucy, MD  cholecalciferol (VITAMIN D3) 25 MCG (1000 UNIT) tablet Take 1,000 Units by mouth daily.    [provider]  cyanocobalamin (VITAMIN B12) 1000 MCG tablet Take 1,000 mcg by mouth daily.    [provider]  diazepam (VALIUM) 5 MG tablet Take 5 mg by mouth every 8 (eight) hours as needed for anxiety.    [provider]  Elastic Bandages & Supports (MEDICAL COMPRESSION STOCKINGS) MISC 1 each by Does not apply route as directed. Knew High Medium Pressure Compression stockings Dx: bilateral lower extremity edema 10/18/23   Mallipeddi, Vishnu P, MD  ferrous sulfate 325 (65 FE) MG tablet Take 1 tablet (325 mg total) by mouth daily with breakfast. 10/25/16   Rehman, Joline Maxcy, MD  furosemide (LASIX) 20 MG tablet TAKE 1 TABLET BY MOUTH TWICE  DAILY.TAKE AN ADDITIONAL TABLET DAILY AS NEEDED FOR SHORTNESS OF BREATH 01/08/24   Mallipeddi, Vishnu P, MD  gabapentin (NEURONTIN) 300 MG capsule Take 300 mg by mouth at bedtime.    [provider]  KLOR-CON M20 20 MEQ tablet TAKE 1 TABLET BY MOUTH EVERY DAY 01/22/24   Mallipeddi, Vishnu P, MD  metFORMIN (GLUCOPHAGE) 500 MG tablet Take 500 mg by mouth 2 (two) times daily with a meal.     [provider]  montelukast (SINGULAIR) 10 MG tablet Take 10 mg by mouth every morning.    [provider]  NON FORMULARY Pt uses a cpap    [provider]  omeprazole (PRILOSEC) 20 MG capsule Take 20 mg by mouth 2 (two) times daily before a meal.    [provider]  ondansetron (ZOFRAN) 4 MG tablet Take 1 tablet (4 mg total) by mouth every 8 (eight) hours as needed for nausea or vomiting. 02/06/24   Teryl Lucy, MD  oxyCODONE (ROXICODONE) 5 MG immediate release tablet Take 1 tablet (5 mg total) by mouth every 4 (four) hours as needed for severe pain (pain score 7-10). 02/06/24   Teryl Lucy, MD  Phenazopyrid-Cranbry-C-Probiot (AZO URINARY TRACT SUPPORT PO) Take 2 tablets by mouth daily.    [provider]  Probiotic Product (PROBIOTIC DAILY PO) Take 1 capsule by mouth daily.    [provider]  rivaroxaban (XARELTO) 20 MG TABS tablet Take 20 mg by  mouth daily with supper.    [provider]  sennosides-docusate sodium (SENOKOT-S) 8.6-50 MG tablet Take 2 tablets by mouth daily. 02/06/24   Teryl Lucy, MD  Turmeric 450 MG CAPS Take 450 mg by mouth daily.    [provider]      Allergies    Nsaids, Cephalexin, Darvocet [propoxyphene n-acetaminophen], Percocet [oxycodone-acetaminophen], and Penicillins    Review of Systems   Review of Systems  All other systems reviewed and are negative.   Physical Exam Updated Vital Signs BP 122/69 (BP Location: Left Arm)   Pulse (!) 101   Temp 99.6 F (37.6 C) (Oral)   Resp 18   Ht  5\' 4"  (1.626 m)   Wt 88 kg   SpO2 92%   BMI 33.30 kg/m  Physical Exam Vitals and nursing note reviewed.   78 year old female, resting comfortably and in no acute distress. Vital signs are significant for minimal elevation of heart rate. Oxygen saturation is 92%, which is normal. Head is normocephalic and atraumatic.  Lungs are clear without rales, wheezes, or rhonchi. Chest is nontender. Heart has regular rate and rhythm without murmur. Extremities: Right knee has dressing in place which is not removed.  There is mild swelling consistent with patient's postoperative state, mild warmth also consistent with postoperative state. Skin is warm and dry without rash. Neurologic: Mental status is normal, cranial nerves are intact, moves all extremities equally.  ED Results / Procedures / Treatments    Procedures Procedures    Medications Ordered in ED Medications  HYDROmorphone (DILAUDID) injection 2 mg (has no administration in time range)  ondansetron (ZOFRAN-ODT) disintegrating tablet 8 mg (has no administration in time range)    ED Course/ Medical Decision Making/ A&P                                 Medical Decision Making Risk Prescription drug management.   Postoperative pain in the right knee not relieved with oxycodone.  I have reviewed her past records and do note hospitalization on 02/06/2024 for a right total knee arthroplasty, discharged on 02/07/2024.  I have ordered a dose of hydromorphone and a dose of ondansetron to prevent nausea and vomiting secondary to hydromorphone.  Following above-noted treatment, she was resting comfortably.  However, she did have some oxygen desaturation.  I have ordered nasal oxygen.  She will need to be observed until she is able to maintain adequate oxygen saturation on room air.  Case is signed out to Dr. Posey Rea.  Final Clinical Impression(s) / ED Diagnoses Final diagnoses:  Postoperative pain    Rx / DC Orders ED Discharge Orders      None         Dione Booze, MD 02/08/24 [phone]

## 2024-02-08 NOTE — ED Provider Notes (Signed)
  Physical Exam  BP (!) 111/55   Pulse (!) 101   Temp 99.6 F (37.6 C) (Oral)   Resp 14   Ht 5\' 4"  (1.626 m)   Wt 88 kg   SpO2 94%   BMI 33.30 kg/m   Physical Exam Vitals and nursing note reviewed.  Constitutional:      General: She is not in acute distress.    Appearance: She is well-developed.  HENT:     Head: Normocephalic and atraumatic.  Eyes:     Conjunctiva/sclera: Conjunctivae normal.  Cardiovascular:     Rate and Rhythm: Normal rate and regular rhythm.     Heart sounds: No murmur heard. Pulmonary:     Effort: Pulmonary effort is normal. No respiratory distress.     Breath sounds: Normal breath sounds.  Abdominal:     Palpations: Abdomen is soft.     Tenderness: There is no abdominal tenderness.  Musculoskeletal:        General: No swelling.     Cervical back: Neck supple.  Skin:    General: Skin is warm and dry.     Capillary Refill: Capillary refill takes less than 2 seconds.  Neurological:     Mental Status: She is alert.  Psychiatric:        Mood and Affect: Mood normal.     Procedures  Procedures  ED Course / MDM    Medical Decision Making Risk OTC drugs. Prescription drug management.   Patient received in handoff.  Postoperative pain after a TKA.  Presentation consistent with postoperative pain after her nerve block wore off.  Compartments are soft and very low suspicion for compartment syndrome here in the ER today.  Wound is well-appearing with no evidence of surrounding erythema or purulence.  At time of signout, patient pending metabolization of sizable dose of Dilaudid given by previous provider.  We had an extended discussion about appropriate pain regimen at home and she will start a q8h Tylenol regimen, q12h Naprosyn regimen, and use oxycodone for breakthrough pain only.  She does have a history of peptic ulcer disease and we will attempt to keep the total duration of NSAID use to 5 days or less.  At this time she does not meet inpatient  criteria for admission and will be discharged with outpatient follow-up       Glendora Score, MD 02/08/24 [phone]

## 2024-02-08 NOTE — Discharge Instructions (Addendum)
For pain:  - Acetaminophen 1000 mg three times daily (every 8 hours) - Naproxen 2 times daily (every 12 hours) - oxycodone for breakthrough pain only - gabapentin as prescribed

## 2024-02-09 ENCOUNTER — Ambulatory Visit: Payer: HMO | Admitting: Internal Medicine

## 2024-02-09 DIAGNOSIS — J45909 Unspecified asthma, uncomplicated: Secondary | ICD-10-CM | POA: Diagnosis not present

## 2024-02-09 DIAGNOSIS — Z471 Aftercare following joint replacement surgery: Secondary | ICD-10-CM | POA: Diagnosis not present

## 2024-02-09 DIAGNOSIS — I10 Essential (primary) hypertension: Secondary | ICD-10-CM | POA: Diagnosis not present

## 2024-02-09 DIAGNOSIS — Z7901 Long term (current) use of anticoagulants: Secondary | ICD-10-CM | POA: Diagnosis not present

## 2024-02-09 DIAGNOSIS — Z96653 Presence of artificial knee joint, bilateral: Secondary | ICD-10-CM | POA: Diagnosis not present

## 2024-02-09 DIAGNOSIS — Z86711 Personal history of pulmonary embolism: Secondary | ICD-10-CM | POA: Diagnosis not present

## 2024-02-09 DIAGNOSIS — Z7984 Long term (current) use of oral hypoglycemic drugs: Secondary | ICD-10-CM | POA: Diagnosis not present

## 2024-02-09 DIAGNOSIS — E1140 Type 2 diabetes mellitus with diabetic neuropathy, unspecified: Secondary | ICD-10-CM | POA: Diagnosis not present

## 2024-02-09 DIAGNOSIS — F419 Anxiety disorder, unspecified: Secondary | ICD-10-CM | POA: Diagnosis not present

## 2024-02-09 DIAGNOSIS — D509 Iron deficiency anemia, unspecified: Secondary | ICD-10-CM | POA: Diagnosis not present

## 2024-02-09 DIAGNOSIS — F32A Depression, unspecified: Secondary | ICD-10-CM | POA: Diagnosis not present

## 2024-02-18 NOTE — Therapy (Incomplete)
OUTPATIENT PHYSICAL THERAPY LOWER EXTREMITY EVALUATION   Patient Name: [name] MRN: [id number] DOB:1946/11/03, 78 y.o., female Today's Date: 02/18/2024  END OF SESSION:   Past Medical History:  Diagnosis Date   Anxiety 12/29/2022   Arthritis    Asthma    Carpal tunnel syndrome    Depression    Dyspnea    Dysrhythmia    Gastric ulcer    GERD (gastroesophageal reflux disease)    H/O hiatal hernia    Heart murmur    since birth   History of heart murmur in childhood    History of kidney stones    Hypertension    Iron deficiency anemia    Neuropathy    Following neck surgery.   Paralysis (HCC)    PONV (postoperative nausea and vomiting)    Primary localized osteoarthritis of right knee 02/06/2024   Pulmonary embolus [facility])    November 2020   Type 2 diabetes mellitus [facility])    Past Surgical History:  Procedure Laterality Date   APPENDECTOMY     ATRIAL FIBRILLATION ABLATION N/A 10/09/2023   Procedure: ATRIAL FIBRILLATION ABLATION;  Surgeon: [name], MD;  Location: [facility] INVASIVE CV LAB;  Service: Cardiovascular;  Laterality: N/A;   BIOPSY  06/01/2022   Procedure: BIOPSY;  Surgeon: Malissa Hippo, MD;  [facility];  Service: Endoscopy;;   CHOLECYSTECTOMY     COLONOSCOPY     COLONOSCOPY N/A 09/26/2013   Procedure: COLONOSCOPY;  Surgeon: Malissa Hippo, MD;  [facility];  Service: Endoscopy;  Laterality: N/A;  1030-rescheduled to 12:00pm Ann notified pt   COLONOSCOPY N/A 03/27/2015   Procedure: COLONOSCOPY;  Surgeon: Malissa Hippo, MD;  [facility];  Service: Endoscopy;  Laterality: N/A;  11:15 - moved to 8:25 - Ann to notify pt   COLONOSCOPY WITH PROPOFOL N/A 06/01/2022   Procedure: COLONOSCOPY WITH PROPOFOL;  Surgeon: Malissa Hippo, MD;  [facility];  Service: Endoscopy;  Laterality: N/A;  1005 ASA 2   CYSTOSCOPY/URETEROSCOPY/HOLMIUM LASER/STENT PLACEMENT Left 12/23/2022   Procedure: CYSTOSCOPY/ LEFT RETROGRADE/  URETEROSCOPY/HOLMIUM LASER/STENT PLACEMENT;  Surgeon: Jerilee Field, MD;  [facility]: [facility];  Service: Urology;  Laterality: Left;   ESOPHAGOGASTRODUODENOSCOPY  12/08/2011   Procedure: ESOPHAGOGASTRODUODENOSCOPY (EGD);  Surgeon: Malissa Hippo, MD;  [facility];  Service: Endoscopy;  Laterality: N/A;  3:00    ESOPHAGOGASTRODUODENOSCOPY N/A 03/27/2015   Procedure: ESOPHAGOGASTRODUODENOSCOPY (EGD);  Surgeon: Malissa Hippo, MD;  [facility];  Service: Endoscopy;  Laterality: N/A;   EYE SURGERY Bilateral    cataract surgery   GIVENS CAPSULE STUDY N/A 11/04/2015   Procedure: GIVENS CAPSULE STUDY;  Surgeon: Malissa Hippo, MD;  [facility];  Service: Endoscopy;  Laterality: N/A;   Hysterscopy     KNEE ARTHROSCOPY     Left   NECK SURGERY     x 2    TONSILLECTOMY     TOTAL KNEE ARTHROPLASTY  07/11/2012   Procedure: TOTAL KNEE ARTHROPLASTY;  Surgeon: Loreta Ave, MD;  Location: [facility] OR;  Service: Orthopedics;  Laterality: Left;  left total knee arthroplasty   TOTAL KNEE ARTHROPLASTY Right 02/06/2024   Procedure: TOTAL KNEE ARTHROPLASTY;  Surgeon: Teryl Lucy, MD;  Location: [facility];  Service: Orthopedics;  Laterality: Right;   UPPER GASTROINTESTINAL ENDOSCOPY     YAG LASER APPLICATION Right 02/21/2017   Procedure: YAG LASER APPLICATION;  Surgeon: Jethro Bolus, MD;  Location: [facility];  Service: Ophthalmology;  Laterality: Right;   Patient Active Problem List   Diagnosis Date Noted   Primary localized osteoarthritis of right knee 02/06/2024   S/P TKR (total knee replacement), right 02/06/2024   (HFpEF) heart failure with preserved ejection fraction (HCC) 07/04/2023   Paroxysmal atrial flutter (HCC) 07/04/2023   OSA (obstructive sleep apnea) 07/04/2023   DOE (dyspnea on exertion) 05/03/2023   Leg swelling 05/03/2023   History of pulmonary embolism 05/03/2023   Anxiety 12/29/2022   Kidney stones 12/29/2022   Ureteral colic 12/29/2022   Flank pain  12/28/2022   History of iron deficiency anemia 02/22/2022   IBS (irritable colon syndrome) 03/23/2021   Lower abdominal pain 03/23/2021   Well woman exam with routine gynecological exam 11/22/2017   Controlled type 2 diabetes mellitus (HCC) 11/02/2017   Nonspecific chest pain 11/02/2017   Bilateral leg edema    Diabetes (HCC) 03/28/2016   Pain in the chest    GERD (gastroesophageal reflux disease)    Chest pain 01/05/2015   Neuropathy 01/05/2015   Bronchitis    Guaiac positive hemoccult card on annual exam 05/08/2014   Chest pain at rest 11/14/2013   Left knee pain 09/18/2013   Salmonella 05/24/2013   Thrombocytopenia, unspecified (HCC) 05/24/2013   Acute gastroenteritis 05/21/2013   Fever 05/21/2013   Abnormal urinalysis 05/21/2013   Hypokalemia 05/21/2013   Dehydration 05/21/2013   Coccydynia 05/07/2013   Anemia 08/06/2012   PUD (peptic ulcer disease) 02/06/2012   Overweight 12/21/2010   Essential hypertension 12/29/2009   Asthma 12/29/2009   GERD 12/29/2009   Diaphragmatic hernia 12/29/2009   HEART MURMUR, SYSTOLIC 12/29/2009    PCP: Assunta Found, MD  REFERRING PROVIDER: Teryl Lucy, MD  REFERRING DIAG: S/P R TOTAL KNEE REPLACEMENT  THERAPY DIAG:  No diagnosis found.  Rationale for Evaluation and Treatment: Rehabilitation  ONSET DATE: ***  SUBJECTIVE:   SUBJECTIVE STATEMENT: ***  PERTINENT HISTORY: *** PAIN:  Are you having pain? {OPRCPAIN:27236}  PRECAUTIONS: {Therapy precautions:24002}  RED FLAGS: {PT Red Flags:29287}   WEIGHT BEARING RESTRICTIONS: {Yes ***/No:24003}  FALLS:  Has patient fallen in last 6 months? {fallsyesno:27318}  LIVING ENVIRONMENT: Lives with: {OPRC lives with:25569::"lives with their family"} Lives in: {Lives in:25570} Stairs: {opstairs:27293} Has following equipment at home: {Assistive devices:23999}  OCCUPATION: ***  PLOF: {PLOF:24004}  PATIENT GOALS: ***  NEXT MD VISIT: ***  OBJECTIVE:  Note: Objective  measures were completed at Evaluation unless otherwise noted.  DIAGNOSTIC FINDINGS: ***  PATIENT SURVEYS:  LEFS ***  COGNITION: Overall cognitive status: {cognition:24006}     SENSATION: {sensation:27233}  EDEMA:  {edema:24020}  MUSCLE LENGTH: Hamstrings: Right *** deg; Left *** deg Maisie Fus test: Right *** deg; Left *** deg  POSTURE: {posture:25561}  PALPATION: ***  LOWER EXTREMITY ROM:  {AROM/PROM:27142} ROM Right eval Left eval  Hip flexion    Hip extension    Hip abduction    Hip adduction    Hip internal rotation    Hip external rotation    Knee flexion    Knee extension    Ankle dorsiflexion    Ankle plantarflexion    Ankle inversion    Ankle eversion     (Blank rows = not tested)  LOWER EXTREMITY MMT:  MMT Right eval Left eval  Hip flexion    Hip extension    Hip abduction    Hip adduction    Hip internal rotation    Hip external rotation    Knee flexion    Knee extension    Ankle dorsiflexion  Ankle plantarflexion    Ankle inversion    Ankle eversion     (Blank rows = not tested)  LOWER EXTREMITY SPECIAL TESTS:  {LEspecialtests:26242}  FUNCTIONAL TESTS:  {Functional tests:24029}  GAIT: Distance walked: *** Assistive device utilized: {Assistive devices:23999} Level of assistance: {Levels of assistance:24026} Comments: ***                                                                                                                                TREATMENT DATE: 02/20/24 physical therapy evaluation and HEP instruction    PATIENT EDUCATION:  Education details: Patient educated on exam findings, POC, scope of PT, HEP, and ***. Person educated: Patient Education method: Explanation, Demonstration, and Handouts Education comprehension: verbalized understanding, returned demonstration, verbal cues required, and tactile cues required  HOME EXERCISE PROGRAM: ***  ASSESSMENT:  CLINICAL IMPRESSION: Patient is a 78 y.o. women who  was seen today for physical therapy evaluation and treatment for S/P R TOTAL KNEE REPLACEMENT.   OBJECTIVE IMPAIRMENTS: {opptimpairments:25111}.   ACTIVITY LIMITATIONS: {activitylimitations:27494}  PARTICIPATION LIMITATIONS: {participationrestrictions:25113}  PERSONAL FACTORS: {Personal factors:25162} are also affecting patient's functional outcome.   REHAB POTENTIAL: Good  CLINICAL DECISION MAKING: Evolving/moderate complexity  EVALUATION COMPLEXITY: Moderate   GOALS: Goals reviewed with patient? No  SHORT TERM GOALS: Target date: *** patient will be independent with initial HEP  Baseline: Goal status: INITIAL  2.  Patient will report 50% improvement overall  Baseline:  Goal status: INITIAL  3.  *** Baseline:  Goal status: INITIAL  4.  *** Baseline:  Goal status: INITIAL  5.  *** Baseline:  Goal status: INITIAL  6.  *** Baseline:  Goal status: INITIAL  LONG TERM GOALS: Target date: ***  Patient will be independent in self management strategies to improve quality of life and functional outcomes.  Baseline:  Goal status: INITIAL  2.  Patient will report 50% improvement overall  Baseline:  Goal status: INITIAL  3.  Patient will increase right leg MMT's to 5/5 to allow navigation of steps without gait deviation or loss of balance Baseline:  Goal status: INITIAL  4.  Patient will increase knee mobility to -2 to 120 to promote normal navigation of steps; step over step pattern  Baseline:  Goal status: INITIAL  5.  *** Baseline:  Goal status: INITIAL  6.  *** Baseline:  Goal status: INITIAL   PLAN:  PT FREQUENCY: {rehab frequency:25116}  PT DURATION: {rehab duration:25117}  PLANNED INTERVENTIONS: 97164- PT Re-evaluation, 97110-Therapeutic exercises, 97530- Therapeutic activity, 97112- Neuromuscular re-education, 97535- Self Care, 16109- Manual therapy, [id number]- Gait training, [phone]- Orthotic Fit/training, [phone]- Canalith repositioning, [id number]-  Aquatic Therapy, [phone]- Splinting, Patient/Family education, Balance training, Stair training, Taping, Dry Needling, Joint mobilization, Joint manipulation, Spinal manipulation, Spinal mobilization, Scar mobilization, and DME instructions.   PLAN FOR NEXT SESSION: Review HEP and goals; knee mobility and strength   7:12 AM, 02/20/24 Eylin Pontarelli Small Sonam Huelsmann MPT [place] [facility] [place] [phone]

## 2024-02-19 DIAGNOSIS — M1711 Unilateral primary osteoarthritis, right knee: Secondary | ICD-10-CM | POA: Diagnosis not present

## 2024-02-20 ENCOUNTER — Other Ambulatory Visit: Payer: Self-pay | Admitting: Internal Medicine

## 2024-02-20 ENCOUNTER — Ambulatory Visit (HOSPITAL_COMMUNITY): Payer: HMO

## 2024-02-20 NOTE — Therapy (Incomplete)
 OUTPATIENT PHYSICAL THERAPY LOWER EXTREMITY EVALUATION   Patient Name: [name] MRN: [id number] DOB:[birth year], 78 y.o., female Today's Date: 02/20/2024  END OF SESSION:   Past Medical History:  Diagnosis Date   Anxiety 12/29/2022   Arthritis    Asthma    Carpal tunnel syndrome    Depression    Dyspnea    Dysrhythmia    Gastric ulcer    GERD (gastroesophageal reflux disease)    H/O hiatal hernia    Heart murmur    since birth   History of heart murmur in childhood    History of kidney stones    Hypertension    Iron deficiency anemia    Neuropathy    Following neck surgery.   Paralysis (HCC)    PONV (postoperative nausea and vomiting)    Primary localized osteoarthritis of right knee 02/06/2024   Pulmonary embolus [facility])    November 2020   Type 2 diabetes mellitus [facility])    Past Surgical History:  Procedure Laterality Date   APPENDECTOMY     ATRIAL FIBRILLATION ABLATION N/A 10/09/2023   Procedure: ATRIAL FIBRILLATION ABLATION;  Surgeon: [name], MD;  Location: [facility] INVASIVE CV LAB;  Service: Cardiovascular;  Laterality: N/A;   BIOPSY  06/01/2022   Procedure: BIOPSY;  Surgeon: Malissa Hippo, MD;  [facility];  Service: Endoscopy;;   CHOLECYSTECTOMY     COLONOSCOPY     COLONOSCOPY N/A 09/26/2013   Procedure: COLONOSCOPY;  Surgeon: Malissa Hippo, MD;  [facility];  Service: Endoscopy;  Laterality: N/A;  1030-rescheduled to 12:00pm Ann notified pt   COLONOSCOPY N/A 03/27/2015   Procedure: COLONOSCOPY;  Surgeon: Malissa Hippo, MD;  [facility];  Service: Endoscopy;  Laterality: N/A;  11:15 - moved to 8:25 - Ann to notify pt   COLONOSCOPY WITH PROPOFOL N/A 06/01/2022   Procedure: COLONOSCOPY WITH PROPOFOL;  Surgeon: Malissa Hippo, MD;  [facility];  Service: Endoscopy;  Laterality: N/A;  1005 ASA 2   CYSTOSCOPY/URETEROSCOPY/HOLMIUM LASER/STENT PLACEMENT Left 12/23/2022   Procedure: CYSTOSCOPY/ LEFT RETROGRADE/  URETEROSCOPY/HOLMIUM LASER/STENT PLACEMENT;  Surgeon: Jerilee Field, MD;  [facility];  Service: Urology;  Laterality: Left;   ESOPHAGOGASTRODUODENOSCOPY  12/08/2011   Procedure: ESOPHAGOGASTRODUODENOSCOPY (EGD);  Surgeon: Malissa Hippo, MD;  [facility];  Service: Endoscopy;  Laterality: N/A;  3:00    ESOPHAGOGASTRODUODENOSCOPY N/A 03/27/2015   Procedure: ESOPHAGOGASTRODUODENOSCOPY (EGD);  Surgeon: Malissa Hippo, MD;  [facility];  Service: Endoscopy;  Laterality: N/A;   EYE SURGERY Bilateral    cataract surgery   GIVENS CAPSULE STUDY N/A 11/04/2015   Procedure: GIVENS CAPSULE STUDY;  Surgeon: Malissa Hippo, MD;  [facility];  Service: Endoscopy;  Laterality: N/A;   Hysterscopy     KNEE ARTHROSCOPY     Left   NECK SURGERY     x 2    TONSILLECTOMY     TOTAL KNEE ARTHROPLASTY  07/11/2012   Procedure: TOTAL KNEE ARTHROPLASTY;  Surgeon: Loreta Ave, MD;  Location: [facility] OR;  Service: Orthopedics;  Laterality: Left;  left total knee arthroplasty   TOTAL KNEE ARTHROPLASTY Right 02/06/2024   Procedure: TOTAL KNEE ARTHROPLASTY;  Surgeon: Teryl Lucy, MD;  Location: [facility];  Service: Orthopedics;  Laterality: Right;   UPPER GASTROINTESTINAL ENDOSCOPY     YAG LASER APPLICATION Right 02/21/2017   Procedure: YAG LASER APPLICATION;  Surgeon: Jethro Bolus, MD;  [facility];  Service: Ophthalmology;  Laterality: Right;   Patient Active Problem List   Diagnosis Date Noted   Primary localized osteoarthritis of right knee 02/06/2024   S/P TKR (total knee replacement), right 02/06/2024   (HFpEF) heart failure with preserved ejection fraction (HCC) 07/04/2023   Paroxysmal atrial flutter (HCC) 07/04/2023   OSA (obstructive sleep apnea) 07/04/2023   DOE (dyspnea on exertion) 05/03/2023   Leg swelling 05/03/2023   History of pulmonary embolism 05/03/2023   Anxiety 12/29/2022   Kidney stones 12/29/2022   Ureteral colic 12/29/2022   Flank pain  12/28/2022   History of iron deficiency anemia 02/22/2022   IBS (irritable colon syndrome) 03/23/2021   Lower abdominal pain 03/23/2021   Well woman exam with routine gynecological exam 11/22/2017   Controlled type 2 diabetes mellitus (HCC) 11/02/2017   Nonspecific chest pain 11/02/2017   Bilateral leg edema    Diabetes (HCC) 03/28/2016   Pain in the chest    GERD (gastroesophageal reflux disease)    Chest pain 01/05/2015   Neuropathy 01/05/2015   Bronchitis    Guaiac positive hemoccult card on annual exam 05/08/2014   Chest pain at rest 11/14/2013   Left knee pain 09/18/2013   Salmonella 05/24/2013   Thrombocytopenia, unspecified (HCC) 05/24/2013   Acute gastroenteritis 05/21/2013   Fever 05/21/2013   Abnormal urinalysis 05/21/2013   Hypokalemia 05/21/2013   Dehydration 05/21/2013   Coccydynia 05/07/2013   Anemia 08/06/2012   PUD (peptic ulcer disease) 02/06/2012   Overweight 12/21/2010   Essential hypertension 12/29/2009   Asthma 12/29/2009   GERD 12/29/2009   Diaphragmatic hernia 12/29/2009   HEART MURMUR, SYSTOLIC 12/29/2009    PCP: Assunta Found, MD  REFERRING PROVIDER: Teryl Lucy, MD  REFERRING DIAG: S/P R TOTAL KNEE REPLACEMENT  THERAPY DIAG:  No diagnosis found.  Rationale for Evaluation and Treatment: Rehabilitation  ONSET DATE: ***  SUBJECTIVE:   SUBJECTIVE STATEMENT: ***  PERTINENT HISTORY: *** PAIN:  Are you having pain? {OPRCPAIN:27236}  PRECAUTIONS: {Therapy precautions:24002}  RED FLAGS: {PT Red Flags:29287}   WEIGHT BEARING RESTRICTIONS: {Yes ***/No:24003}  FALLS:  Has patient fallen in last 6 months? {fallsyesno:27318}  LIVING ENVIRONMENT: Lives with: {OPRC lives with:25569::"lives with their family"} Lives in: {Lives in:25570} Stairs: {opstairs:27293} Has following equipment at home: {Assistive devices:23999}  OCCUPATION: ***  PLOF: {PLOF:24004}  PATIENT GOALS: ***  NEXT MD VISIT: ***  OBJECTIVE:  Note: Objective  measures were completed at Evaluation unless otherwise noted.  DIAGNOSTIC FINDINGS: ***  PATIENT SURVEYS:  LEFS ***  COGNITION: Overall cognitive status: {cognition:24006}     SENSATION: {sensation:27233}  EDEMA:  {edema:24020}  MUSCLE LENGTH: Hamstrings: Right *** deg; Left *** deg Maisie Fus test: Right *** deg; Left *** deg  POSTURE: {posture:25561}  PALPATION: ***  LOWER EXTREMITY ROM:  {AROM/PROM:27142} ROM Right eval Left eval  Hip flexion    Hip extension    Hip abduction    Hip adduction    Hip internal rotation    Hip external rotation    Knee flexion    Knee extension    Ankle dorsiflexion    Ankle plantarflexion    Ankle inversion    Ankle eversion     (Blank rows = not tested)  LOWER EXTREMITY MMT:  MMT Right eval Left eval  Hip flexion    Hip extension    Hip abduction    Hip adduction    Hip internal rotation    Hip external rotation    Knee flexion    Knee extension    Ankle dorsiflexion  Ankle plantarflexion    Ankle inversion    Ankle eversion     (Blank rows = not tested)  LOWER EXTREMITY SPECIAL TESTS:  {LEspecialtests:26242}  FUNCTIONAL TESTS:  {Functional tests:24029}  GAIT: Distance walked: *** Assistive device utilized: {Assistive devices:23999} Level of assistance: {Levels of assistance:24026} Comments: ***                                                                                                                                TREATMENT DATE: 02/20/24 physical therapy evaluation and HEP instruction    PATIENT EDUCATION:  Education details: Patient educated on exam findings, POC, scope of PT, HEP, and ***. Person educated: Patient Education method: Explanation, Demonstration, and Handouts Education comprehension: verbalized understanding, returned demonstration, verbal cues required, and tactile cues required  HOME EXERCISE PROGRAM: ***  ASSESSMENT:  CLINICAL IMPRESSION: Patient is a 78 y.o. women who  was seen today for physical therapy evaluation and treatment for S/P R TOTAL KNEE REPLACEMENT.   OBJECTIVE IMPAIRMENTS: {opptimpairments:25111}.   ACTIVITY LIMITATIONS: {activitylimitations:27494}  PARTICIPATION LIMITATIONS: {participationrestrictions:25113}  PERSONAL FACTORS: {Personal factors:25162} are also affecting patient's functional outcome.   REHAB POTENTIAL: Good  CLINICAL DECISION MAKING: Evolving/moderate complexity  EVALUATION COMPLEXITY: Moderate   GOALS: Goals reviewed with patient? No  SHORT TERM GOALS: Target date: *** patient will be independent with initial HEP  Baseline: Goal status: INITIAL  2.  Patient will report 50% improvement overall  Baseline:  Goal status: INITIAL  3.  *** Baseline:  Goal status: INITIAL  4.  *** Baseline:  Goal status: INITIAL  5.  *** Baseline:  Goal status: INITIAL  6.  *** Baseline:  Goal status: INITIAL  LONG TERM GOALS: Target date: ***  Patient will be independent in self management strategies to improve quality of life and functional outcomes.  Baseline:  Goal status: INITIAL  2.  Patient will report 50% improvement overall  Baseline:  Goal status: INITIAL  3.  Patient will increase right leg MMT's to 5/5 to allow navigation of steps without gait deviation or loss of balance Baseline:  Goal status: INITIAL  4.  Patient will increase knee mobility to -2 to 120 to promote normal navigation of steps; step over step pattern  Baseline:  Goal status: INITIAL  5.  *** Baseline:  Goal status: INITIAL  6.  *** Baseline:  Goal status: INITIAL   PLAN:  PT FREQUENCY: {rehab frequency:25116}  PT DURATION: {rehab duration:25117}  PLANNED INTERVENTIONS: 97164- PT Re-evaluation, 97110-Therapeutic exercises, 97530- Therapeutic activity, 97112- Neuromuscular re-education, 97535- Self Care, 60454- Manual therapy, [id number]- Gait training, [phone]- Orthotic Fit/training, [phone]- Canalith repositioning, [id number]-  Aquatic Therapy, [phone]- Splinting, Patient/Family education, Balance training, Stair training, Taping, Dry Needling, Joint mobilization, Joint manipulation, Spinal manipulation, Spinal mobilization, Scar mobilization, and DME instructions.   PLAN FOR NEXT SESSION: Review HEP and goals; knee mobility and strength   3:36 PM, 02/20/24 Debbora Ang Small Robyne Matar MPT [place] [facility] [place] [phone]

## 2024-02-21 ENCOUNTER — Encounter (HOSPITAL_COMMUNITY): Payer: Self-pay

## 2024-02-21 ENCOUNTER — Ambulatory Visit (HOSPITAL_COMMUNITY): Payer: HMO | Attending: Orthopedic Surgery

## 2024-02-21 ENCOUNTER — Other Ambulatory Visit: Payer: Self-pay

## 2024-02-21 DIAGNOSIS — M25661 Stiffness of right knee, not elsewhere classified: Secondary | ICD-10-CM | POA: Diagnosis not present

## 2024-02-21 DIAGNOSIS — M1711 Unilateral primary osteoarthritis, right knee: Secondary | ICD-10-CM | POA: Diagnosis not present

## 2024-02-21 DIAGNOSIS — Z96651 Presence of right artificial knee joint: Secondary | ICD-10-CM | POA: Diagnosis not present

## 2024-02-21 NOTE — Addendum Note (Signed)
 Addended by: Luz Lex on: 02/21/2024 03:35 PM   Modules accepted: Orders

## 2024-02-21 NOTE — Progress Notes (Signed)
 OUTPATIENT PHYSICAL THERAPY LOWER EXTREMITY EVALUATION   Patient Name: [name] MRN: [id number] DOB:1946-02-12, 78 y.o., female Today's Date: 02/21/2024  END OF SESSION:  PT End of Session - 02/21/24 0927     Visit Number 1    Number of Visits 17    Date for PT Re-Evaluation 03/21/24    Authorization Type [employer]    Authorization Time Period no auth needed    PT Start Time 0930    PT Stop Time 1015    PT Time Calculation (min) 45 min    Equipment Utilized During Treatment Other (comment)   pt presented with 2WRW   Activity Tolerance Patient tolerated treatment well;Patient limited by pain    Behavior During Therapy [facility] for tasks assessed/performed             Past Medical History:  Diagnosis Date   Anxiety 12/29/2022   Arthritis    Asthma    Carpal tunnel syndrome    Depression    Dyspnea    Dysrhythmia    Gastric ulcer    GERD (gastroesophageal reflux disease)    H/O hiatal hernia    Heart murmur    since birth   History of heart murmur in childhood    History of kidney stones    Hypertension    Iron deficiency anemia    Neuropathy    Following neck surgery.   Paralysis (HCC)    PONV (postoperative nausea and vomiting)    Primary localized osteoarthritis of right knee 02/06/2024   Pulmonary embolus [facility])    November 2020   Type 2 diabetes mellitus [facility])    Past Surgical History:  Procedure Laterality Date   APPENDECTOMY     ATRIAL FIBRILLATION ABLATION N/A 10/09/2023   Procedure: ATRIAL FIBRILLATION ABLATION;  Surgeon: [name], MD;  Location: [facility] INVASIVE CV LAB;  Service: Cardiovascular;  Laterality: N/A;   BIOPSY  06/01/2022   Procedure: BIOPSY;  Surgeon: Malissa Hippo, MD;  [facility];  Service: Endoscopy;;   CHOLECYSTECTOMY     COLONOSCOPY     COLONOSCOPY N/A 09/26/2013   Procedure: COLONOSCOPY;  Surgeon: Malissa Hippo, MD;  [facility];  Service: Endoscopy;  Laterality: N/A;  1030-rescheduled to  12:00pm Ann notified pt   COLONOSCOPY N/A 03/27/2015   Procedure: COLONOSCOPY;  Surgeon: Malissa Hippo, MD;  [facility];  Service: Endoscopy;  Laterality: N/A;  11:15 - moved to 8:25 - Ann to notify pt   COLONOSCOPY WITH PROPOFOL N/A 06/01/2022   Procedure: COLONOSCOPY WITH PROPOFOL;  Surgeon: Malissa Hippo, MD;  [facility];  Service: Endoscopy;  Laterality: N/A;  1005 ASA 2   CYSTOSCOPY/URETEROSCOPY/HOLMIUM LASER/STENT PLACEMENT Left 12/23/2022   Procedure: CYSTOSCOPY/ LEFT RETROGRADE/ URETEROSCOPY/HOLMIUM LASER/STENT PLACEMENT;  Surgeon: Jerilee Field, MD;  Location: [facility];  Service: Urology;  Laterality: Left;   ESOPHAGOGASTRODUODENOSCOPY  12/08/2011   Procedure: ESOPHAGOGASTRODUODENOSCOPY (EGD);  Surgeon: Malissa Hippo, MD;  [facility];  Service: Endoscopy;  Laterality: N/A;  3:00    ESOPHAGOGASTRODUODENOSCOPY N/A 03/27/2015   Procedure: ESOPHAGOGASTRODUODENOSCOPY (EGD);  Surgeon: Malissa Hippo, MD;  [facility];  Service: Endoscopy;  Laterality: N/A;   EYE SURGERY Bilateral    cataract surgery   GIVENS CAPSULE STUDY N/A 11/04/2015   Procedure: GIVENS CAPSULE STUDY;  Surgeon: Malissa Hippo, MD;  [facility];  Service: Endoscopy;  Laterality: N/A;   Hysterscopy     KNEE ARTHROSCOPY  Left   NECK SURGERY     x 2    TONSILLECTOMY     TOTAL KNEE ARTHROPLASTY  07/11/2012   Procedure: TOTAL KNEE ARTHROPLASTY;  Surgeon: Loreta Ave, MD;  Location: [facility] OR;  Service: Orthopedics;  Laterality: Left;  left total knee arthroplasty   TOTAL KNEE ARTHROPLASTY Right 02/06/2024   Procedure: TOTAL KNEE ARTHROPLASTY;  Surgeon: Teryl Lucy, MD;  Location: [facility];  Service: Orthopedics;  Laterality: Right;   UPPER GASTROINTESTINAL ENDOSCOPY     YAG LASER APPLICATION Right 02/21/2017   Procedure: YAG LASER APPLICATION;  Surgeon: Jethro Bolus, MD;  Location: [facility];  Service: Ophthalmology;  Laterality: Right;   Patient Active  Problem List   Diagnosis Date Noted   Primary localized osteoarthritis of right knee 02/06/2024   S/P TKR (total knee replacement), right 02/06/2024   (HFpEF) heart failure with preserved ejection fraction (HCC) 07/04/2023   Paroxysmal atrial flutter (HCC) 07/04/2023   OSA (obstructive sleep apnea) 07/04/2023   DOE (dyspnea on exertion) 05/03/2023   Leg swelling 05/03/2023   History of pulmonary embolism 05/03/2023   Anxiety 12/29/2022   Kidney stones 12/29/2022   Ureteral colic 12/29/2022   Flank pain 12/28/2022   History of iron deficiency anemia 02/22/2022   IBS (irritable colon syndrome) 03/23/2021   Lower abdominal pain 03/23/2021   Well woman exam with routine gynecological exam 11/22/2017   Controlled type 2 diabetes mellitus (HCC) 11/02/2017   Nonspecific chest pain 11/02/2017   Bilateral leg edema    Diabetes (HCC) 03/28/2016   Pain in the chest    GERD (gastroesophageal reflux disease)    Chest pain 01/05/2015   Neuropathy 01/05/2015   Bronchitis    Guaiac positive hemoccult card on annual exam 05/08/2014   Chest pain at rest 11/14/2013   Left knee pain 09/18/2013   Salmonella 05/24/2013   Thrombocytopenia, unspecified (HCC) 05/24/2013   Acute gastroenteritis 05/21/2013   Fever 05/21/2013   Abnormal urinalysis 05/21/2013   Hypokalemia 05/21/2013   Dehydration 05/21/2013   Coccydynia 05/07/2013   Anemia 08/06/2012   PUD (peptic ulcer disease) 02/06/2012   Overweight 12/21/2010   Essential hypertension 12/29/2009   Asthma 12/29/2009   GERD 12/29/2009   Diaphragmatic hernia 12/29/2009   HEART MURMUR, SYSTOLIC 12/29/2009    PCP: Assunta Found, MD  REFERRING PROVIDER: Teryl Lucy, MD   REFERRING DIAG: S/P R TOTAL KNEE REPLACEMENT   THERAPY DIAG:  Primary osteoarthritis of right knee  Stiffness of right knee, not elsewhere classified  S/P TKR (total knee replacement), right  Rationale for Evaluation and Treatment: Rehabilitation  ONSET DATE:  02/06/24  SUBJECTIVE:   SUBJECTIVE STATEMENT: Pt states that the incision is still being covered by butterfly bandages. Pt states that she has been getting around the house with the 2WRW but also furniture walks occasionally. Pt reports that the pain is not present with rest although having to go to the ER due to leg pain on 02/08/24 immeiatley following surgery. Reported 5/10 pain upon presentation.  PERTINENT HISTORY: Pt is a pleasant 78 year old female presenting S/P Right TKA. Pt states that before surgery, on December 1st, 2024, she started using a 2WRW because of the knee pain. Pt also had X-rays taken and injection therapy was attempted. Pt elected to have the surgery on 02/06/24 and reports no complications. PAIN:  Are you having pain? Yes: NPRS scale: 5/10 Pain location: R knee, mainly joint line and anterior, and incision area Pain description: bee stings, (nerve block) Aggravating  factors: Walking,  Relieving factors: elevating it, ice, pain meds  PRECAUTIONS: Other: WBAT  RED FLAGS: None   WEIGHT BEARING RESTRICTIONS: Yes WBAT  FALLS:  Has patient fallen in last 6 months? No  LIVING ENVIRONMENT: Lives with: lives with their family and lives with their spouse Lives in: House/apartment Stairs: Yes: External: 2 steps; none Has following equipment at home: Dan Humphreys - 2 wheeled  OCCUPATION: retired  PLOF:  Started using the walker December the first due to the knee, cardiac ablation in fall.  PATIENT GOALS: Walk with no AD,   NEXT MD VISIT: 03/18/24 (had home [facility] with [facility])  OBJECTIVE:  Note: Objective measures were completed at Evaluation unless otherwise noted.  DIAGNOSTIC FINDINGS: Pt states having imaging done yesterday but is not in Chart this morning 2/26.  PATIENT SURVEYS:  LEFS 36/80  COGNITION: Overall cognitive status:  noticed some changes for the next couple days after surgery, loopy feeling      SENSATION: WFL  EDEMA:  Slight increase  compared to the left   POSTURE: No Significant postural limitations  PALPATION: Tenderness to right knee globally, mostly over medial and lateral joint lines, and patella.  LOWER EXTREMITY ROM:  Active ROM Right eval Left eval  Hip flexion    Hip extension    Hip abduction    Hip adduction    Hip internal rotation    Hip external rotation    Knee flexion 104 109  Knee extension -12 -2  Ankle dorsiflexion    Ankle plantarflexion    Ankle inversion    Ankle eversion     (Blank rows = not tested)  LOWER EXTREMITY MMT:  MMT Right eval Left eval  Hip flexion    Hip extension    Hip abduction    Hip adduction    Hip internal rotation    Hip external rotation    Knee flexion 3-   Knee extension 3   Ankle dorsiflexion    Ankle plantarflexion    Ankle inversion    Ankle eversion     (Blank rows = not tested)  LOWER EXTREMITY SPECIAL TESTS:   Not indicated at this time.  FUNCTIONAL TESTS:  2 minute walk test: 195  GAIT: Distance walked: 195 ft during Assistive device utilized: Walker - 2 wheeled Level of assistance: Modified independence Comments: Pt demonstrates WNL gait pattern with 2WRW with symmetrical stride length and stance time.                                                                                                                                 TREATMENT DATE:  02/21/2024  Evaluation, Knee ROM and strength assessment, gait assessment, HEP prescription Therapeutic Exercise: -Seated LAQ, 1 set of 10 reps -Knee extension AAROM with LLE, 1 set of 10 reps. Pt cued to allow RLE to do most of the movement and just to use LLE for end range extension. Therapeutic Activity: -  Sit to stands from standard Chair height with no UE support, 1 set of 8 reps (pt cued to avoid UE support if possible)     PATIENT EDUCATION:  Education details: Pt was instructed on POC, pain management, self-care, and HEP. Person educated: Patient and Spouse Education  method: Explanation, Demonstration, Verbal cues, and Handouts Education comprehension: verbalized understanding, returned demonstration, and verbal cues required  HOME EXERCISE PROGRAM: Access Code: [facility] URL: [email or web address] Date: 02/21/2024 Prepared by: Luz Lex  Exercises - Sit to Stand with Arms Crossed  - 2 x daily - 7 x weekly - 3 sets - 10 reps - Seated Knee Extension AAROM  - 2 x daily - 7 x weekly - 3 sets - 10 reps - Seated Long Arc Quad  - 2 x daily - 7 x weekly - 3 sets - 10 reps  ASSESSMENT:  CLINICAL IMPRESSION: Patient is a 78 y.o. female who was seen today for physical therapy evaluation and treatment for s/p RIGHT TKA.   OBJECTIVE IMPAIRMENTS: Abnormal gait, decreased balance, decreased endurance, decreased mobility, difficulty walking, decreased ROM, decreased strength, hypomobility, impaired sensation, and pain.   ACTIVITY LIMITATIONS: carrying, lifting, bending, sitting, standing, squatting, sleeping, stairs, transfers, bed mobility, and locomotion level  PARTICIPATION LIMITATIONS: meal prep, cleaning, laundry, and community activity  PERSONAL FACTORS: Age, Fitness, and 1-2 comorbidities: cardiac ablation procedure   are also affecting patient's functional outcome.   REHAB POTENTIAL: Good  CLINICAL DECISION MAKING: Stable/uncomplicated  EVALUATION COMPLEXITY: Low   GOALS: Goals reviewed with patient? No  SHORT TERM GOALS: Target date: 03/20/2024 Patient will demonstrate evidence of independence with individualized HEP and will report compliance for at least 3 days per week for optimized progression towards remaining therapy goals. Baseline: NA Goal status: INITIAL  2.  Patient will report a decrease in pain level during community ambulation by at least 2 points for improved quality of life. Baseline: 5/10 Goal status: INITIAL    LONG TERM GOALS: Target date: 04/17/2024  Pt will demonstrate a an increase of at least 9  points on the LEFS for improved performance of community ambulation and ADL. Baseline: 02/21/24: Score 36/80 Goal status: INITIAL  2.  Pt will demonstrate WFL ROM (flexion and extension) in right knee, for increased mobility and maximal efficiency of gait cycle during ambulation. Baseline: 02/21/24: Flex: 104 Ext: -12 Goal status: INITIAL  3.  Pt will demonstrate at least 4-/5 MMT for right lower extremity for increased strength during ADL and community ambulation. Baseline: 3- Goal status: INITIAL  4.  Pt will demonstrate the ability to ambulate at least 463 feet during the 2 MWT for increased LE endurance and meet age predicted norms. Baseline: 02/21/24: 192 feet Goal status: INITIAL     PLAN:  PT FREQUENCY: 2x/week  PT DURATION: 8 weeks  PLANNED INTERVENTIONS: 97110-Therapeutic exercises, 97530- Therapeutic activity, 97112- Neuromuscular re-education, 97535- Self Care, [id number]- Manual therapy, [phone]- Gait training, Balance training, Stair training, Joint mobilization, DME instructions, Cryotherapy, and Moist heat  PLAN FOR NEXT SESSION: Progress LE strengthening and endurance training, progress gait training with LRAD and additional SLS WB activities as tolerated.   Luz Lex, PT, DPT [facility] Office: [phone]

## 2024-02-22 ENCOUNTER — Ambulatory Visit (HOSPITAL_COMMUNITY): Payer: HMO

## 2024-02-22 DIAGNOSIS — M1711 Unilateral primary osteoarthritis, right knee: Secondary | ICD-10-CM

## 2024-02-22 DIAGNOSIS — M25661 Stiffness of right knee, not elsewhere classified: Secondary | ICD-10-CM

## 2024-02-22 DIAGNOSIS — Z96651 Presence of right artificial knee joint: Secondary | ICD-10-CM

## 2024-02-22 NOTE — Therapy (Signed)
 OUTPATIENT PHYSICAL THERAPY LOWER EXTREMITY TREATMENT     Patient Name: [name] MRN: [id number] DOB:20-Oct-1946, 78 y.o., female Today's Date: 02/21/2024     END OF SESSION:   PT End of Session - 02/22/24 1039     Visit Number 2    Number of Visits 17    Date for PT Re-Evaluation 03/21/24    Authorization Type [employer]    Authorization Time Period no auth needed    PT Start Time 1020    PT Stop Time 1100    PT Time Calculation (min) 40 min    Activity Tolerance Patient tolerated treatment well;Patient limited by pain                        Past Medical History:  Diagnosis Date   Anxiety 12/29/2022   Arthritis     Asthma     Carpal tunnel syndrome     Depression     Dyspnea     Dysrhythmia     Gastric ulcer     GERD (gastroesophageal reflux disease)     H/O hiatal hernia     Heart murmur      since birth   History of heart murmur in childhood     History of kidney stones     Hypertension     Iron deficiency anemia     Neuropathy      Following neck surgery.   Paralysis (HCC)     PONV (postoperative nausea and vomiting)     Primary localized osteoarthritis of right knee 02/06/2024   Pulmonary embolus [facility]      November 2020   Type 2 diabetes mellitus [facility])               Past Surgical History:  Procedure Laterality Date   APPENDECTOMY       ATRIAL FIBRILLATION ABLATION N/A 10/09/2023    Procedure: ATRIAL FIBRILLATION ABLATION;  Surgeon: [name], MD;  Location: [facility] INVASIVE CV LAB;  Service: Cardiovascular;  Laterality: N/A;   BIOPSY   06/01/2022    Procedure: BIOPSY;  Surgeon: Malissa Hippo, MD;  Location: [facility];  Service: Endoscopy;;   CHOLECYSTECTOMY       COLONOSCOPY       COLONOSCOPY N/A 09/26/2013    Procedure: COLONOSCOPY;  Surgeon: Malissa Hippo, MD;  Location: [facility];  Service: Endoscopy;  Laterality: N/A;  1030-rescheduled to 12:00pm Ann notified pt   COLONOSCOPY N/A 03/27/2015    Procedure:  COLONOSCOPY;  Surgeon: Malissa Hippo, MD;  Location: [facility];  Service: Endoscopy;  Laterality: N/A;  11:15 - moved to 8:25 - Ann to notify pt   COLONOSCOPY WITH PROPOFOL N/A 06/01/2022    Procedure: COLONOSCOPY WITH PROPOFOL;  Surgeon: Malissa Hippo, MD;  Location: [facility];  Service: Endoscopy;  Laterality: N/A;  1005 ASA 2   CYSTOSCOPY/URETEROSCOPY/HOLMIUM LASER/STENT PLACEMENT Left 12/23/2022    Procedure: CYSTOSCOPY/ LEFT RETROGRADE/ URETEROSCOPY/HOLMIUM LASER/STENT PLACEMENT;  Surgeon: Jerilee Field, MD;  Location: [facility] ORS;  Service: Urology;  Laterality: Left;   ESOPHAGOGASTRODUODENOSCOPY   12/08/2011    Procedure: ESOPHAGOGASTRODUODENOSCOPY (EGD);  Surgeon: Malissa Hippo, MD;  Location: [facility];  Service: Endoscopy;  Laterality: N/A;  3:00    ESOPHAGOGASTRODUODENOSCOPY N/A 03/27/2015    Procedure: ESOPHAGOGASTRODUODENOSCOPY (EGD);  Surgeon: Malissa Hippo, MD;  [facility]: [facility];  Service: Endoscopy;  Laterality: N/A;   EYE SURGERY Bilateral  cataract surgery   GIVENS CAPSULE STUDY N/A 11/04/2015    Procedure: GIVENS CAPSULE STUDY;  Surgeon: Malissa Hippo, MD;  Location: [facility];  Service: Endoscopy;  Laterality: N/A;   Hysterscopy       KNEE ARTHROSCOPY        Left   NECK SURGERY        x 2    TONSILLECTOMY       TOTAL KNEE ARTHROPLASTY   07/11/2012    Procedure: TOTAL KNEE ARTHROPLASTY;  Surgeon: Loreta Ave, MD;  Location[facility] OR;  Service: Orthopedics;  Laterality: Left;  left total knee arthroplasty   TOTAL KNEE ARTHROPLASTY Right 02/06/2024    Procedure: TOTAL KNEE ARTHROPLASTY;  Surgeon: Teryl Lucy, MD;  Location: [facility] OR[facility];  Service: Orthopedics;  Laterality: Right;   UPPER GASTROINTESTINAL ENDOSCOPY       YAG LASER APPLICATION Right 02/21/2017    Procedure: YAG LASER APPLICATION;  Surgeon: Jethro Bolus, MD;  Location: [facility];  Service: Ophthalmology;  Laterality: Right;            Patient Active Problem List    Diagnosis Date  Noted   Primary localized osteoarthritis of right knee 02/06/2024   S/P TKR (total knee replacement), right 02/06/2024   (HFpEF) heart failure with preserved ejection fraction (HCC) 07/04/2023   Paroxysmal atrial flutter (HCC) 07/04/2023   OSA (obstructive sleep apnea) 07/04/2023   DOE (dyspnea on exertion) 05/03/2023   Leg swelling 05/03/2023   History of pulmonary embolism 05/03/2023   Anxiety 12/29/2022   Kidney stones 12/29/2022   Ureteral colic 12/29/2022   Flank pain 12/28/2022   History of iron deficiency anemia 02/22/2022   IBS (irritable colon syndrome) 03/23/2021   Lower abdominal pain 03/23/2021   Well woman exam with routine gynecological exam 11/22/2017   Controlled type 2 diabetes mellitus (HCC) 11/02/2017   Nonspecific chest pain 11/02/2017   Bilateral leg edema     Diabetes (HCC) 03/28/2016   Pain in the chest     GERD (gastroesophageal reflux disease)     Chest pain 01/05/2015   Neuropathy 01/05/2015   Bronchitis     Guaiac positive hemoccult card on annual exam 05/08/2014   Chest pain at rest 11/14/2013   Left knee pain 09/18/2013   Salmonella 05/24/2013   Thrombocytopenia, unspecified (HCC) 05/24/2013   Acute gastroenteritis 05/21/2013   Fever 05/21/2013   Abnormal urinalysis 05/21/2013   Hypokalemia 05/21/2013   Dehydration 05/21/2013   Coccydynia 05/07/2013   Anemia 08/06/2012   PUD (peptic ulcer disease) 02/06/2012   Overweight 12/21/2010   Essential hypertension 12/29/2009   Asthma 12/29/2009   GERD 12/29/2009   Diaphragmatic hernia 12/29/2009   HEART MURMUR, SYSTOLIC 12/29/2009      PCP: Assunta Found, MD   REFERRING PROVIDER: Teryl Lucy, MD    REFERRING DIAG: S/P R TOTAL KNEE REPLACEMENT    THERAPY DIAG:  Primary osteoarthritis of right knee   Stiffness of right knee, not elsewhere classified   S/P TKR (total knee replacement), right   Rationale for Evaluation and Treatment: Rehabilitation   ONSET DATE: 02/06/24   SUBJECTIVE:     SUBJECTIVE STATEMENT: Currently reports of R knee pain = 4/10. Patient did not report of the knee being aggravated from yesterday's session.  EVAL: Pt states that the incision is still being covered by butterfly bandages. Pt states that she has been getting around the house with the 2WRW but also furniture walks occasionally. Pt reports that the pain is not  present with rest although having to go to the ER due to leg pain on 02/08/24 immeiatley following surgery. Reported 5/10 pain upon presentation.   PERTINENT HISTORY: EVAL: Pt is a pleasant 78 year old female presenting S/P Right TKA. Pt states that before surgery, on December 1st, 2024, she started using a 2WRW because of the knee pain. Pt also had X-rays taken and injection therapy was attempted. Pt elected to have the surgery on 02/06/24 and reports no complications. PAIN:  Are you having pain? Yes: NPRS scale: 5/10 Pain location: R knee, mainly joint line and anterior, and incision area Pain description: bee stings, (nerve block) Aggravating factors: Walking,  Relieving factors: elevating it, ice, pain meds   PRECAUTIONS: Other: WBAT   RED FLAGS: None      WEIGHT BEARING RESTRICTIONS: Yes WBAT   FALLS:  Has patient fallen in last 6 months? No   LIVING ENVIRONMENT: Lives with: lives with their family and lives with their spouse Lives in: House/apartment Stairs: Yes: External: 2 steps; none Has following equipment at home: Dan Humphreys - 2 wheeled   OCCUPATION: retired   PLOF:  Started using the walker December the first due to the knee, cardiac ablation in fall.   PATIENT GOALS: Walk with no AD,    NEXT MD VISIT: 03/18/24 (had home [facility] with [facility])   OBJECTIVE:  Note: Objective measures were completed at Evaluation unless otherwise noted.   DIAGNOSTIC FINDINGS: Pt states having imaging done yesterday but is not in Chart this morning 2/26.   PATIENT SURVEYS:  LEFS 36/80   COGNITION: Overall cognitive status:   noticed some changes for the next couple days after surgery, loopy feeling                                  SENSATION: WFL   EDEMA:  Slight increase compared to the left     POSTURE: No Significant postural limitations   PALPATION: Tenderness to right knee globally, mostly over medial and lateral joint lines, and patella.   LOWER EXTREMITY ROM:   Active ROM Right eval Left eval  Hip flexion      Hip extension      Hip abduction      Hip adduction      Hip internal rotation      Hip external rotation      Knee flexion 104 109  Knee extension -12 -2  Ankle dorsiflexion      Ankle plantarflexion      Ankle inversion      Ankle eversion       (Blank rows = not tested)   LOWER EXTREMITY MMT:   MMT Right eval Left eval  Hip flexion      Hip extension      Hip abduction      Hip adduction      Hip internal rotation      Hip external rotation      Knee flexion 3-    Knee extension 3    Ankle dorsiflexion      Ankle plantarflexion      Ankle inversion      Ankle eversion       (Blank rows = not tested)   LOWER EXTREMITY SPECIAL TESTS:             Not indicated at this time.   FUNCTIONAL TESTS:  2 minute walk test: 195   GAIT:  Distance walked: 195 ft during Assistive device utilized: Walker - 2 wheeled Level of assistance: Modified independence Comments: Pt demonstrates WNL gait pattern with 2WRW with symmetrical stride length and stance time.                                                                                                                                   TREATMENT DATE:  02/22/2024  Supine R heel slides with a strap x 10 x 3 x 30" stretch at the start of each set Supine R SLR x 10 x 2 Hip vectors x 10 x 2 on each Mini Squats x 3" x 10  02/21/2024  Evaluation, Knee ROM and strength assessment, gait assessment, HEP prescription Therapeutic Exercise: -Seated LAQ, 1 set of 10 reps -Knee extension AAROM with LLE, 1 set of 10 reps. Pt  cued to allow RLE to do most of the movement and just to use LLE for end range extension. Therapeutic Activity: -Sit to stands from standard Chair height with no UE support, 1 set of 8 reps (pt cued to avoid UE support if possible)       PATIENT EDUCATION:  Education details: Pt was instructed on POC, pain management, self-care, and HEP. Person educated: Patient and Spouse Education method: Explanation, Demonstration, Verbal cues, and Handouts Education comprehension: verbalized understanding, returned demonstration, and verbal cues required   HOME EXERCISE PROGRAM: Access Code: [facility] URL: [email or web address] Date: 02/21/2024 Prepared by: Luz Lex   Exercises - Sit to Stand with Arms Crossed  - 2 x daily - 7 x weekly - 3 sets - 10 reps - Seated Knee Extension AAROM  - 2 x daily - 7 x weekly - 3 sets - 10 reps - Seated Long Arc Quad  - 2 x daily - 7 x weekly - 3 sets - 10 reps   ASSESSMENT:   CLINICAL IMPRESSION: Interventions today were geared towards LE strengthening and mobility. Tolerated all activities without worsening of symptoms except on the last set of heel slides where patient reported of slight pain. Demonstrated appropriate levels of fatigue. Provided slight amount of cueing to ensure correct execution of activity with good carry-over. To date, skilled PT is required to address the impairments and improve function.  EVAL: Patient is a 78 y.o. female who was seen today for physical therapy evaluation and treatment for s/p RIGHT TKA.    OBJECTIVE IMPAIRMENTS: Abnormal gait, decreased balance, decreased endurance, decreased mobility, difficulty walking, decreased ROM, decreased strength, hypomobility, impaired sensation, and pain.    ACTIVITY LIMITATIONS: carrying, lifting, bending, sitting, standing, squatting, sleeping, stairs, transfers, bed mobility, and locomotion level   PARTICIPATION LIMITATIONS: meal prep, cleaning, laundry, and community  activity   PERSONAL FACTORS: Age, Fitness, and 1-2 comorbidities: cardiac ablation procedure   are also affecting patient's functional outcome.    REHAB POTENTIAL: Good   CLINICAL DECISION MAKING: Stable/uncomplicated   EVALUATION COMPLEXITY: Low  GOALS: Goals reviewed with patient: Yes   SHORT TERM GOALS: Target date: 03/20/2024 Patient will demonstrate evidence of independence with individualized HEP and will report compliance for at least 3 days per week for optimized progression towards remaining therapy goals. Baseline: NA Goal status: INITIAL   2.  Patient will report a decrease in pain level during community ambulation by at least 2 points for improved quality of life. Baseline: 5/10 Goal status: INITIAL       LONG TERM GOALS: Target date: 04/17/2024   Pt will demonstrate a an increase of at least 9 points on the LEFS for improved performance of community ambulation and ADL. Baseline: 02/21/24: Score 36/80 Goal status: INITIAL   2.  Pt will demonstrate WFL ROM (flexion and extension) in right knee, for increased mobility and maximal efficiency of gait cycle during ambulation. Baseline: 02/21/24: Flex: 104 Ext: -12 Goal status: INITIAL   3.  Pt will demonstrate at least 4-/5 MMT for right lower extremity for increased strength during ADL and community ambulation. Baseline: 3- Goal status: INITIAL   4.  Pt will demonstrate the ability to ambulate at least 463 feet during the 2 MWT for increased LE endurance and meet age predicted norms. Baseline: 02/21/24: 192 feet Goal status: INITIAL         PLAN:   PT FREQUENCY: 2x/week   PT DURATION: 8 weeks   PLANNED INTERVENTIONS: 97110-Therapeutic exercises, 97530- Therapeutic activity, 97112- Neuromuscular re-education, 97535- Self Care, 13086- Manual therapy, [phone]- Gait training, Balance training, Stair training, Joint mobilization, DME instructions, Cryotherapy, and Moist heat   PLAN FOR NEXT SESSION: Progress  LE strengthening and endurance training, progress gait training with LRAD and additional SLS WB activities as tolerated.  Tish Frederickson. Taquita Demby, PT, DPT, OCS Board-Certified Clinical Specialist in Orthopedic PT PT Compact Privilege # ([place]): [id number] T 02/22/2024 11:00 AM

## 2024-02-23 ENCOUNTER — Ambulatory Visit (HOSPITAL_COMMUNITY)
Admission: RE | Admit: 2024-02-23 | Discharge: 2024-02-23 | Disposition: A | Discharge status: Home / Self Care | Payer: HMO | Source: Ambulatory Visit | Attending: Urology | Admitting: Urology

## 2024-02-23 DIAGNOSIS — N200 Calculus of kidney: Secondary | ICD-10-CM | POA: Diagnosis not present

## 2024-02-23 DIAGNOSIS — N281 Cyst of kidney, acquired: Secondary | ICD-10-CM | POA: Diagnosis not present

## 2024-02-26 ENCOUNTER — Ambulatory Visit (HOSPITAL_COMMUNITY): Payer: HMO | Attending: Orthopedic Surgery

## 2024-02-26 DIAGNOSIS — Z96651 Presence of right artificial knee joint: Secondary | ICD-10-CM | POA: Insufficient documentation

## 2024-02-26 DIAGNOSIS — M1711 Unilateral primary osteoarthritis, right knee: Secondary | ICD-10-CM | POA: Diagnosis not present

## 2024-02-26 DIAGNOSIS — M25661 Stiffness of right knee, not elsewhere classified: Secondary | ICD-10-CM | POA: Insufficient documentation

## 2024-02-26 NOTE — Therapy (Signed)
 OUTPATIENT PHYSICAL THERAPY LOWER EXTREMITY TREATMENT     Patient Name: [name] MRN: [id number] DOB:1946-01-08, 78 y.o., female Today's Date: 02/21/2024     END OF SESSION:   PT End of Session - 02/26/24 0933     Visit Number 3    Number of Visits 17    Date for PT Re-Evaluation 03/21/24    Authorization Type [employer]    Authorization Time Period no auth needed    PT Start Time 0933    PT Stop Time 1012    PT Time Calculation (min) 39 min    Activity Tolerance Patient tolerated treatment well;Patient limited by pain                        Past Medical History:  Diagnosis Date   Anxiety 12/29/2022   Arthritis     Asthma     Carpal tunnel syndrome     Depression     Dyspnea     Dysrhythmia     Gastric ulcer     GERD (gastroesophageal reflux disease)     H/O hiatal hernia     Heart murmur      since birth   History of heart murmur in childhood     History of kidney stones     Hypertension     Iron deficiency anemia     Neuropathy      Following neck surgery.   Paralysis (HCC)     PONV (postoperative nausea and vomiting)     Primary localized osteoarthritis of right knee 02/06/2024   Pulmonary embolus [facility])      November 2020   Type 2 diabetes mellitus [facility])               Past Surgical History:  Procedure Laterality Date   APPENDECTOMY       ATRIAL FIBRILLATION ABLATION N/A 10/09/2023    Procedure: ATRIAL FIBRILLATION ABLATION;  Surgeon: [name], MD;  Location: [facility] INVASIVE CV LAB;  Service: Cardiovascular;  Laterality: N/A;   BIOPSY   06/01/2022    Procedure: BIOPSY;  Surgeon: Malissa Hippo, MD;  [facility];  Service: Endoscopy;;   CHOLECYSTECTOMY       COLONOSCOPY       COLONOSCOPY N/A 09/26/2013    Procedure: COLONOSCOPY;  Surgeon: Malissa Hippo, MD;  [facility]: [facility];  Service: Endoscopy;  Laterality: N/A;  1030-rescheduled to 12:00pm Ann notified pt   COLONOSCOPY N/A 03/27/2015    Procedure:  COLONOSCOPY;  Surgeon: Malissa Hippo, MD;  Location: [facility];  Service: Endoscopy;  Laterality: N/A;  11:15 - moved to 8:25 - Ann to notify pt   COLONOSCOPY WITH PROPOFOL N/A 06/01/2022    Procedure: COLONOSCOPY WITH PROPOFOL;  Surgeon: Malissa Hippo, MD;  [facility]: [facility];  Service: Endoscopy;  Laterality: N/A;  1005 ASA 2   CYSTOSCOPY/URETEROSCOPY/HOLMIUM LASER/STENT PLACEMENT Left 12/23/2022    Procedure: CYSTOSCOPY/ LEFT RETROGRADE/ URETEROSCOPY/HOLMIUM LASER/STENT PLACEMENT;  Surgeon: Jerilee Field, MD;  Location: [facility];  Service: Urology;  Laterality: Left;   ESOPHAGOGASTRODUODENOSCOPY   12/08/2011    Procedure: ESOPHAGOGASTRODUODENOSCOPY (EGD);  Surgeon: Malissa Hippo, MD;  [facility]: [facility];  Service: Endoscopy;  Laterality: N/A;  3:00    ESOPHAGOGASTRODUODENOSCOPY N/A 03/27/2015    Procedure: ESOPHAGOGASTRODUODENOSCOPY (EGD);  Surgeon: Malissa Hippo, MD;  [facility];  Service: Endoscopy;  Laterality: N/A;   EYE SURGERY Bilateral  cataract surgery   GIVENS CAPSULE STUDY N/A 11/04/2015    Procedure: GIVENS CAPSULE STUDY;  Surgeon: Malissa Hippo, MD;  [facility]: [facility];  Service: Endoscopy;  Laterality: N/A;   Hysterscopy       KNEE ARTHROSCOPY        Left   NECK SURGERY        x 2    TONSILLECTOMY       TOTAL KNEE ARTHROPLASTY   07/11/2012    Procedure: TOTAL KNEE ARTHROPLASTY;  Surgeon: Loreta Ave, MD;  Location: [facility] OR;  Service: Orthopedics;  Laterality: Left;  left total knee arthroplasty   TOTAL KNEE ARTHROPLASTY Right 02/06/2024    Procedure: TOTAL KNEE ARTHROPLASTY;  Surgeon: Teryl Lucy, MD;  Location: [facility];  Service: Orthopedics;  Laterality: Right;   UPPER GASTROINTESTINAL ENDOSCOPY       YAG LASER APPLICATION Right 02/21/2017    Procedure: YAG LASER APPLICATION;  Surgeon: Jethro Bolus, MD;  Location: [facility];  Service: Ophthalmology;  Laterality: Right;            Patient Active Problem List    Diagnosis Date  Noted   Primary localized osteoarthritis of right knee 02/06/2024   S/P TKR (total knee replacement), right 02/06/2024   (HFpEF) heart failure with preserved ejection fraction (HCC) 07/04/2023   Paroxysmal atrial flutter (HCC) 07/04/2023   OSA (obstructive sleep apnea) 07/04/2023   DOE (dyspnea on exertion) 05/03/2023   Leg swelling 05/03/2023   History of pulmonary embolism 05/03/2023   Anxiety 12/29/2022   Kidney stones 12/29/2022   Ureteral colic 12/29/2022   Flank pain 12/28/2022   History of iron deficiency anemia 02/22/2022   IBS (irritable colon syndrome) 03/23/2021   Lower abdominal pain 03/23/2021   Well woman exam with routine gynecological exam 11/22/2017   Controlled type 2 diabetes mellitus (HCC) 11/02/2017   Nonspecific chest pain 11/02/2017   Bilateral leg edema     Diabetes (HCC) 03/28/2016   Pain in the chest     GERD (gastroesophageal reflux disease)     Chest pain 01/05/2015   Neuropathy 01/05/2015   Bronchitis     Guaiac positive hemoccult card on annual exam 05/08/2014   Chest pain at rest 11/14/2013   Left knee pain 09/18/2013   Salmonella 05/24/2013   Thrombocytopenia, unspecified (HCC) 05/24/2013   Acute gastroenteritis 05/21/2013   Fever 05/21/2013   Abnormal urinalysis 05/21/2013   Hypokalemia 05/21/2013   Dehydration 05/21/2013   Coccydynia 05/07/2013   Anemia 08/06/2012   PUD (peptic ulcer disease) 02/06/2012   Overweight 12/21/2010   Essential hypertension 12/29/2009   Asthma 12/29/2009   GERD 12/29/2009   Diaphragmatic hernia 12/29/2009   HEART MURMUR, SYSTOLIC 12/29/2009      PCP: Assunta Found, MD   REFERRING PROVIDER: Teryl Lucy, MD    REFERRING DIAG: S/P R TOTAL KNEE REPLACEMENT    THERAPY DIAG:  Primary osteoarthritis of right knee   Stiffness of right knee, not elsewhere classified   S/P TKR (total knee replacement), right   Rationale for Evaluation and Treatment: Rehabilitation   ONSET DATE: 02/06/24   SUBJECTIVE:     SUBJECTIVE STATEMENT: 4/10 soreness right knee on arrival.  Her stomach has been upset this morning but she has IBS and thinks that is what is going on.    EVAL: Pt states that the incision is still being covered by butterfly bandages. Pt states that she has been getting around the house with the 2WRW but also furniture walks  occasionally. Pt reports that the pain is not present with rest although having to go to the ER due to leg pain on 02/08/24 immeiatley following surgery. Reported 5/10 pain upon presentation.   PERTINENT HISTORY: EVAL: Pt is a pleasant 78 year old female presenting S/P Right TKA. Pt states that before surgery, on December 1st, 2024, she started using a 2WRW because of the knee pain. Pt also had X-rays taken and injection therapy was attempted. Pt elected to have the surgery on 02/06/24 and reports no complications. PAIN:  Are you having pain? Yes: NPRS scale: 5/10 Pain location: R knee, mainly joint line and anterior, and incision area Pain description: bee stings, (nerve block) Aggravating factors: Walking,  Relieving factors: elevating it, ice, pain meds   PRECAUTIONS: Other: WBAT   RED FLAGS: None      WEIGHT BEARING RESTRICTIONS: Yes WBAT   FALLS:  Has patient fallen in last 6 months? No   LIVING ENVIRONMENT: Lives with: lives with their family and lives with their spouse Lives in: House/apartment Stairs: Yes: External: 2 steps; none Has following equipment at home: Dan Humphreys - 2 wheeled   OCCUPATION: retired   PLOF:  Started using the walker December the first due to the knee, cardiac ablation in fall.   PATIENT GOALS: Walk with no AD,    NEXT MD VISIT: 03/18/24 (had home [facility] with [facility])   OBJECTIVE:  Note: Objective measures were completed at Evaluation unless otherwise noted.   DIAGNOSTIC FINDINGS: Pt states having imaging done yesterday but is not in Chart this morning 2/26.   PATIENT SURVEYS:  LEFS 36/80   COGNITION: Overall  cognitive status:  noticed some changes for the next couple days after surgery, loopy feeling                                  SENSATION: WFL   EDEMA:  Slight increase compared to the left     POSTURE: No Significant postural limitations   PALPATION: Tenderness to right knee globally, mostly over medial and lateral joint lines, and patella.   LOWER EXTREMITY ROM:   Active ROM Right eval Left eval Right 3/3  Hip flexion       Hip extension       Hip abduction       Hip adduction       Hip internal rotation       Hip external rotation       Knee flexion 104 109 111  Knee extension -12 -2 -8  Ankle dorsiflexion       Ankle plantarflexion       Ankle inversion       Ankle eversion        (Blank rows = not tested)   LOWER EXTREMITY MMT:   MMT Right eval Left eval  Hip flexion      Hip extension      Hip abduction      Hip adduction      Hip internal rotation      Hip external rotation      Knee flexion 3-    Knee extension 3    Ankle dorsiflexion      Ankle plantarflexion      Ankle inversion      Ankle eversion       (Blank rows = not tested)   LOWER EXTREMITY SPECIAL TESTS:  Not indicated at this time.   FUNCTIONAL TESTS:  2 minute walk test: 195   GAIT: Distance walked: 195 ft during Assistive device utilized: Walker - 2 wheeled Level of assistance: Modified independence Comments: Pt demonstrates WNL gait pattern with 2WRW with symmetrical stride length and stance time.                                                                                                                                   TREATMENT DATE:  02/26/24 Supine: Quad set 5" x 10 Heel slides x 10 Right knee AROM -8 to 111 Manual extension stretch x 5 Seated  Dangle x 1' Hamstring stretch 5 x 10" LAQ's 2# 2 x 10 Standing: heel raises x 10 Slant board 5 x 10" 8" box knee drives for flexion x 2' // bars walking with left UE support only to mimic walking with  cane   02/22/2024  Supine R heel slides with a strap x 10 x 3 x 30" stretch at the start of each set Supine R SLR x 10 x 2 Hip vectors x 10 x 2 on each Mini Squats x 3" x 10  02/21/2024  Evaluation, Knee ROM and strength assessment, gait assessment, HEP prescription Therapeutic Exercise: -Seated LAQ, 1 set of 10 reps -Knee extension AAROM with LLE, 1 set of 10 reps. Pt cued to allow RLE to do most of the movement and just to use LLE for end range extension. Therapeutic Activity: -Sit to stands from standard Chair height with no UE support, 1 set of 8 reps (pt cued to avoid UE support if possible)       PATIENT EDUCATION:  Education details: Pt was instructed on POC, pain management, self-care, and HEP. Person educated: Patient and Spouse Education method: Explanation, Demonstration, Verbal cues, and Handouts Education comprehension: verbalized understanding, returned demonstration, and verbal cues required   HOME EXERCISE PROGRAM: Access Code: [facility] URL: [email or web address] Date: 02/21/2024 Prepared by: Luz Lex   Exercises - Sit to Stand with Arms Crossed  - 2 x daily - 7 x weekly - 3 sets - 10 reps - Seated Knee Extension AAROM  - 2 x daily - 7 x weekly - 3 sets - 10 reps - Seated Long Arc Quad  - 2 x daily - 7 x weekly - 3 sets - 10 reps   ASSESSMENT:   CLINICAL IMPRESSION: Today's session with focus on right knee mobility and strength.  She felt a little lightheaded with standing exercises; she has not eaten today so we discontinued standing exercise; ended with Long arc quads.  Patient demonstrates good improvement with mobility right knee; tight and painful at end range extension. Patient will benefit from continued skilled therapy services to address deficits and promote return to optimal function.          EVAL: Patient is a 78 y.o. female who was seen today for physical therapy evaluation and  treatment for s/p RIGHT TKA.    OBJECTIVE  IMPAIRMENTS: Abnormal gait, decreased balance, decreased endurance, decreased mobility, difficulty walking, decreased ROM, decreased strength, hypomobility, impaired sensation, and pain.    ACTIVITY LIMITATIONS: carrying, lifting, bending, sitting, standing, squatting, sleeping, stairs, transfers, bed mobility, and locomotion level   PARTICIPATION LIMITATIONS: meal prep, cleaning, laundry, and community activity   PERSONAL FACTORS: Age, Fitness, and 1-2 comorbidities: cardiac ablation procedure   are also affecting patient's functional outcome.    REHAB POTENTIAL: Good   CLINICAL DECISION MAKING: Stable/uncomplicated   EVALUATION COMPLEXITY: Low     GOALS: Goals reviewed with patient: Yes   SHORT TERM GOALS: Target date: 03/20/2024 Patient will demonstrate evidence of independence with individualized HEP and will report compliance for at least 3 days per week for optimized progression towards remaining therapy goals. Baseline: NA Goal status: INITIAL   2.  Patient will report a decrease in pain level during community ambulation by at least 2 points for improved quality of life. Baseline: 5/10 Goal status: INITIAL       LONG TERM GOALS: Target date: 04/17/2024   Pt will demonstrate a an increase of at least 9 points on the LEFS for improved performance of community ambulation and ADL. Baseline: 02/21/24: Score 36/80 Goal status: INITIAL   2.  Pt will demonstrate WFL ROM (flexion and extension) in right knee, for increased mobility and maximal efficiency of gait cycle during ambulation. Baseline: 02/21/24: Flex: 104 Ext: -12 Goal status: INITIAL   3.  Pt will demonstrate at least 4-/5 MMT for right lower extremity for increased strength during ADL and community ambulation. Baseline: 3- Goal status: INITIAL   4.  Pt will demonstrate the ability to ambulate at least 463 feet during the 2 MWT for increased LE endurance and meet age predicted norms. Baseline: 02/21/24: 192  feet Goal status: INITIAL         PLAN:   PT FREQUENCY: 2x/week   PT DURATION: 8 weeks   PLANNED INTERVENTIONS: 97110-Therapeutic exercises, 97530- Therapeutic activity, 97112- Neuromuscular re-education, 97535- Self Care, [id number]- Manual therapy, [phone]- Gait training, Balance training, Stair training, Joint mobilization, DME instructions, Cryotherapy, and Moist heat   PLAN FOR NEXT SESSION: Progress LE strengthening and endurance training, progress gait training with LRAD and additional SLS WB activities as tolerated. Gait training with SPC; bike  10:14 AM, 02/26/24 Nashaly Dorantes Small Geraldine Tesar MPT [place] [facility] [place] [phone]

## 2024-02-27 DIAGNOSIS — G4733 Obstructive sleep apnea (adult) (pediatric): Secondary | ICD-10-CM | POA: Diagnosis not present

## 2024-02-28 ENCOUNTER — Ambulatory Visit (HOSPITAL_COMMUNITY): Payer: HMO | Admitting: Physical Therapy

## 2024-02-28 DIAGNOSIS — Z96651 Presence of right artificial knee joint: Secondary | ICD-10-CM

## 2024-02-28 DIAGNOSIS — M1711 Unilateral primary osteoarthritis, right knee: Secondary | ICD-10-CM

## 2024-02-28 DIAGNOSIS — M25661 Stiffness of right knee, not elsewhere classified: Secondary | ICD-10-CM

## 2024-02-28 NOTE — Therapy (Signed)
 OUTPATIENT PHYSICAL THERAPY LOWER EXTREMITY TREATMENT     Patient Name: [name] MRN: [id number] DOB:1946-01-16, 78 y.o., female Today's Date: 02/21/2024   END OF SESSION:   PT End of Session - 02/28/24 1458     Visit Number 4    Number of Visits 17    Date for PT Re-Evaluation 03/21/24    Authorization Type [employer]    Authorization Time Period no auth needed    PT Start Time 1015    PT Stop Time 1100    PT Time Calculation (min) 45 min    Activity Tolerance Patient tolerated treatment well;Patient limited by pain                    Past Medical History:  Diagnosis Date   Anxiety 12/29/2022   Arthritis     Asthma     Carpal tunnel syndrome     Depression     Dyspnea     Dysrhythmia     Gastric ulcer     GERD (gastroesophageal reflux disease)     H/O hiatal hernia     Heart murmur      since birth   History of heart murmur in childhood     History of kidney stones     Hypertension     Iron deficiency anemia     Neuropathy      Following neck surgery.   Paralysis (HCC)     PONV (postoperative nausea and vomiting)     Primary localized osteoarthritis of right knee 02/06/2024   Pulmonary embolus [facility])      November 2020   Type 2 diabetes mellitus [facility])               Past Surgical History:  Procedure Laterality Date   APPENDECTOMY       ATRIAL FIBRILLATION ABLATION N/A 10/09/2023    Procedure: ATRIAL FIBRILLATION ABLATION;  Surgeon: [name], MD;  Location: [facility] INVASIVE CV LAB;  Service: Cardiovascular;  Laterality: N/A;   BIOPSY   06/01/2022    Procedure: BIOPSY;  Surgeon: Malissa Hippo, MD;  Location: [facility];  Service: Endoscopy;;   CHOLECYSTECTOMY       COLONOSCOPY       COLONOSCOPY N/A 09/26/2013    Procedure: COLONOSCOPY;  Surgeon: Malissa Hippo, MD;  Location: [facility];  Service: Endoscopy;  Laterality: N/A;  1030-rescheduled to 12:00pm Ann notified pt   COLONOSCOPY N/A 03/27/2015    Procedure: COLONOSCOPY;   Surgeon: Malissa Hippo, MD;  Location: [facility];  Service: Endoscopy;  Laterality: N/A;  11:15 - moved to 8:25 - Ann to notify pt   COLONOSCOPY WITH PROPOFOL N/A 06/01/2022    Procedure: COLONOSCOPY WITH PROPOFOL;  Surgeon: Malissa Hippo, MD;  Location: [facility];  Service: Endoscopy;  Laterality: N/A;  1005 ASA 2   CYSTOSCOPY/URETEROSCOPY/HOLMIUM LASER/STENT PLACEMENT Left 12/23/2022    Procedure: CYSTOSCOPY/ LEFT RETROGRADE/ URETEROSCOPY/HOLMIUM LASER/STENT PLACEMENT;  Surgeon: Jerilee Field, MD;  Location: [facility];  Service: Urology;  Laterality: Left;   ESOPHAGOGASTRODUODENOSCOPY   12/08/2011    Procedure: ESOPHAGOGASTRODUODENOSCOPY (EGD);  Surgeon: Malissa Hippo, MD;  Location: [facility];  Service: Endoscopy;  Laterality: N/A;  3:00    ESOPHAGOGASTRODUODENOSCOPY N/A 03/27/2015    Procedure: ESOPHAGOGASTRODUODENOSCOPY (EGD);  Surgeon: Malissa Hippo, MD;  Location: [facility];  Service: Endoscopy;  Laterality: N/A;   EYE SURGERY Bilateral      cataract surgery  GIVENS CAPSULE STUDY N/A 11/04/2015    Procedure: GIVENS CAPSULE STUDY;  Surgeon: Malissa Hippo, MD;  Location: [facility];  Service: Endoscopy;  Laterality: N/A;   Hysterscopy       KNEE ARTHROSCOPY        Left   NECK SURGERY        x 2    TONSILLECTOMY       TOTAL KNEE ARTHROPLASTY   07/11/2012    Procedure: TOTAL KNEE ARTHROPLASTY;  Surgeon: Loreta Ave, MD;  Location: [facility] OR;  Service: Orthopedics;  Laterality: Left;  left total knee arthroplasty   TOTAL KNEE ARTHROPLASTY Right 02/06/2024    Procedure: TOTAL KNEE ARTHROPLASTY;  Surgeon: Teryl Lucy, MD;  Location: [facility];  Service: Orthopedics;  Laterality: Right;   UPPER GASTROINTESTINAL ENDOSCOPY       YAG LASER APPLICATION Right 02/21/2017    Procedure: YAG LASER APPLICATION;  Surgeon: Jethro Bolus, MD;  Location: [facility];  Service: Ophthalmology;  Laterality: Right;            Patient Active Problem List    Diagnosis Date Noted    Primary localized osteoarthritis of right knee 02/06/2024   S/P TKR (total knee replacement), right 02/06/2024   (HFpEF) heart failure with preserved ejection fraction (HCC) 07/04/2023   Paroxysmal atrial flutter (HCC) 07/04/2023   OSA (obstructive sleep apnea) 07/04/2023   DOE (dyspnea on exertion) 05/03/2023   Leg swelling 05/03/2023   History of pulmonary embolism 05/03/2023   Anxiety 12/29/2022   Kidney stones 12/29/2022   Ureteral colic 12/29/2022   Flank pain 12/28/2022   History of iron deficiency anemia 02/22/2022   IBS (irritable colon syndrome) 03/23/2021   Lower abdominal pain 03/23/2021   Well woman exam with routine gynecological exam 11/22/2017   Controlled type 2 diabetes mellitus (HCC) 11/02/2017   Nonspecific chest pain 11/02/2017   Bilateral leg edema     Diabetes (HCC) 03/28/2016   Pain in the chest     GERD (gastroesophageal reflux disease)     Chest pain 01/05/2015   Neuropathy 01/05/2015   Bronchitis     Guaiac positive hemoccult card on annual exam 05/08/2014   Chest pain at rest 11/14/2013   Left knee pain 09/18/2013   Salmonella 05/24/2013   Thrombocytopenia, unspecified (HCC) 05/24/2013   Acute gastroenteritis 05/21/2013   Fever 05/21/2013   Abnormal urinalysis 05/21/2013   Hypokalemia 05/21/2013   Dehydration 05/21/2013   Coccydynia 05/07/2013   Anemia 08/06/2012   PUD (peptic ulcer disease) 02/06/2012   Overweight 12/21/2010   Essential hypertension 12/29/2009   Asthma 12/29/2009   GERD 12/29/2009   Diaphragmatic hernia 12/29/2009   HEART MURMUR, SYSTOLIC 12/29/2009      PCP: Assunta Found, MD   REFERRING PROVIDER: Teryl Lucy, MD    REFERRING DIAG: S/P R TOTAL KNEE REPLACEMENT    THERAPY DIAG:  Primary osteoarthritis of right knee   Stiffness of right knee, not elsewhere classified   S/P TKR (total knee replacement), right   Rationale for Evaluation and Treatment: Rehabilitation   ONSET DATE: 02/06/24   SUBJECTIVE:     SUBJECTIVE STATEMENT: Pt reports she does good today, all day yesterday without pain but last night was rough and had difficulty resting due to pain.  States she has taken pain meds today and currently is doing well.  Brought her cane today and also looser pants worn for therapist to inpect incision.     EVAL: Pt states that the incision is still  being covered by butterfly bandages. Pt states that she has been getting around the house with the 2WRW but also furniture walks occasionally. Pt reports that the pain is not present with rest although having to go to the ER due to leg pain on 02/08/24 immeiatley following surgery. Reported 5/10 pain upon presentation.   PERTINENT HISTORY: EVAL: Pt is a pleasant 78 year old female presenting S/P Right TKA. Pt states that before surgery, on December 1st, 2024, she started using a 2WRW because of the knee pain. Pt also had X-rays taken and injection therapy was attempted. Pt elected to have the surgery on 02/06/24 and reports no complications. PAIN:  Are you having pain? Yes: NPRS scale: 5/10 Pain location: R knee, mainly joint line and anterior, and incision area Pain description: bee stings, (nerve block) Aggravating factors: Walking,  Relieving factors: elevating it, ice, pain meds   PRECAUTIONS: Other: WBAT   RED FLAGS: None      WEIGHT BEARING RESTRICTIONS: Yes WBAT   FALLS:  Has patient fallen in last 6 months? No   LIVING ENVIRONMENT: Lives with: lives with their family and lives with their spouse Lives in: House/apartment Stairs: Yes: External: 2 steps; none Has following equipment at home: Dan Humphreys - 2 wheeled   OCCUPATION: retired   PLOF:  Started using the walker December the first due to the knee, cardiac ablation in fall.   PATIENT GOALS: Walk with no AD,    NEXT MD VISIT: 03/18/24 (had home [facility] with [facility])   OBJECTIVE:  Note: Objective measures were completed at Evaluation unless otherwise noted.   DIAGNOSTIC  FINDINGS: Pt states having imaging done yesterday but is not in Chart this morning 2/26.   PATIENT SURVEYS:  LEFS 36/80   COGNITION: Overall cognitive status:  noticed some changes for the next couple days after surgery, loopy feeling                                  SENSATION: WFL   EDEMA:  Slight increase compared to the left     POSTURE: No Significant postural limitations   PALPATION: Tenderness to right knee globally, mostly over medial and lateral joint lines, and patella.   LOWER EXTREMITY ROM:   Active ROM Right eval Left eval Right 3/3  Hip flexion       Hip extension       Hip abduction       Hip adduction       Hip internal rotation       Hip external rotation       Knee flexion 104 109 111  Knee extension -12 -2 -8  Ankle dorsiflexion       Ankle plantarflexion       Ankle inversion       Ankle eversion        (Blank rows = not tested)   LOWER EXTREMITY MMT:   MMT Right eval Left eval  Hip flexion      Hip extension      Hip abduction      Hip adduction      Hip internal rotation      Hip external rotation      Knee flexion 3-    Knee extension 3    Ankle dorsiflexion      Ankle plantarflexion      Ankle inversion      Ankle eversion       (  Blank rows = not tested)   LOWER EXTREMITY SPECIAL TESTS:             Not indicated at this time.   FUNCTIONAL TESTS:  2 minute walk test: 195   GAIT: Distance walked: 195 ft during Assistive device utilized: Walker - 2 wheeled Level of assistance: Modified independence Comments: Pt demonstrates WNL gait pattern with 2WRW with symmetrical stride length and stance time.                                                                                                                                   TREATMENT DATE:  02/28/24 Bike seat 10 full revolutions 5 minutes Gait with SPC 120 feet step through gait, good stability Gait without AD 100 feet step through gait, good stability Standing:  knee flexion stretch onto 12" box  Rt Knee flexion 10X  Tandem stance 2X30" each on foam beam, max of 8" without UE assist  SLS 1-2" max without UE assist X 5 trails each Stair negotiation 4"/7" 2RT reciprocally with bil UE assist Supine scar/knee inspection  AROM 115    02/26/24 Supine: Quad set 5" x 10 Heel slides x 10 Right knee AROM -8 to 111 Manual extension stretch x 5 Seated  Dangle x 1' Hamstring stretch 5 x 10" LAQ's 2# 2 x 10 Standing: heel raises x 10 Slant board 5 x 10" 8" box knee drives for flexion x 2' // bars walking with left UE support only to mimic walking with cane   02/22/2024  Supine R heel slides with a strap x 10 x 3 x 30" stretch at the start of each set Supine R SLR x 10 x 2 Hip vectors x 10 x 2 on each Mini Squats x 3" x 10  02/21/2024  Evaluation, Knee ROM and strength assessment, gait assessment, HEP prescription Therapeutic Exercise: -Seated LAQ, 1 set of 10 reps -Knee extension AAROM with LLE, 1 set of 10 reps. Pt cued to allow RLE to do most of the movement and just to use LLE for end range extension. Therapeutic Activity: -Sit to stands from standard Chair height with no UE support, 1 set of 8 reps (pt cued to avoid UE support if possible)       PATIENT EDUCATION:  Education details: Pt was instructed on POC, pain management, self-care, and HEP. Person educated: Patient and Spouse Education method: Explanation, Demonstration, Verbal cues, and Handouts Education comprehension: verbalized understanding, returned demonstration, and verbal cues required   HOME EXERCISE PROGRAM: Access Code: [facility] URL: [email or web address] Date: 02/21/2024 Prepared by: Luz Lex   Exercises - Sit to Stand with Arms Crossed  - 2 x daily - 7 x weekly - 3 sets - 10 reps - Seated Knee Extension AAROM  - 2 x daily - 7 x weekly - 3 sets - 10 reps - Seated Long Arc Quad  - 2 x daily - 7 x  weekly - 3 sets - 10 reps   ASSESSMENT:    CLINICAL IMPRESSION: Pt is progressing very well.  Comes today with SPC and able to negotiate with good stability and sequencing.  Pt can also walk without AD without issues so recommended using at least a cane outdoors at this time until balance is improved. Therapist inspected incision with noted healing and no signs/symptoms of infection.   Pt able to negotiate stairs reciprocally with HR and no AD today both 4" and 7" height.  Balance remains most challenging with inability to maintain SLS greater than 8" with either LE lead with tandem and only 1-2" with SLS on either LE.  Patient will benefit from continued skilled therapy services to address deficits and promote return to optimal function.         EVAL: Patient is a 78 y.o. female who was seen today for physical therapy evaluation and treatment for s/p RIGHT TKA.    OBJECTIVE IMPAIRMENTS: Abnormal gait, decreased balance, decreased endurance, decreased mobility, difficulty walking, decreased ROM, decreased strength, hypomobility, impaired sensation, and pain.    ACTIVITY LIMITATIONS: carrying, lifting, bending, sitting, standing, squatting, sleeping, stairs, transfers, bed mobility, and locomotion level   PARTICIPATION LIMITATIONS: meal prep, cleaning, laundry, and community activity   PERSONAL FACTORS: Age, Fitness, and 1-2 comorbidities: cardiac ablation procedure   are also affecting patient's functional outcome.    REHAB POTENTIAL: Good   CLINICAL DECISION MAKING: Stable/uncomplicated   EVALUATION COMPLEXITY: Low     GOALS: Goals reviewed with patient: Yes   SHORT TERM GOALS: Target date: 03/20/2024 Patient will demonstrate evidence of independence with individualized HEP and will report compliance for at least 3 days per week for optimized progression towards remaining therapy goals. Baseline: NA Goal status: INITIAL   2.  Patient will report a decrease in pain level during community ambulation by at least 2 points for  improved quality of life. Baseline: 5/10 Goal status: INITIAL       LONG TERM GOALS: Target date: 04/17/2024   Pt will demonstrate a an increase of at least 9 points on the LEFS for improved performance of community ambulation and ADL. Baseline: 02/21/24: Score 36/80 Goal status: INITIAL   2.  Pt will demonstrate WFL ROM (flexion and extension) in right knee, for increased mobility and maximal efficiency of gait cycle during ambulation. Baseline: 02/21/24: Flex: 104 Ext: -12 Goal status: INITIAL   3.  Pt will demonstrate at least 4-/5 MMT for right lower extremity for increased strength during ADL and community ambulation. Baseline: 3- Goal status: INITIAL   4.  Pt will demonstrate the ability to ambulate at least 463 feet during the 2 MWT for increased LE endurance and meet age predicted norms. Baseline: 02/21/24: 192 feet Goal status: INITIAL         PLAN:   PT FREQUENCY: 2x/week   PT DURATION: 8 weeks   PLANNED INTERVENTIONS: 97110-Therapeutic exercises, 97530- Therapeutic activity, 97112- Neuromuscular re-education, 97535- Self Care, 78469- Manual therapy, [phone]- Gait training, Balance training, Stair training, Joint mobilization, DME instructions, Cryotherapy, and Moist heat   PLAN FOR NEXT SESSION: focus on balance/stability.  ROM and strength is great.   10:23 AM, 02/28/24 Lurena Nida, PTA/CLT [facility] Ph: [phone]

## 2024-03-04 ENCOUNTER — Ambulatory Visit (HOSPITAL_COMMUNITY): Payer: HMO | Admitting: Physical Therapy

## 2024-03-04 DIAGNOSIS — M25661 Stiffness of right knee, not elsewhere classified: Secondary | ICD-10-CM

## 2024-03-04 DIAGNOSIS — M1711 Unilateral primary osteoarthritis, right knee: Secondary | ICD-10-CM

## 2024-03-04 DIAGNOSIS — Z96651 Presence of right artificial knee joint: Secondary | ICD-10-CM

## 2024-03-04 NOTE — Therapy (Signed)
 OUTPATIENT PHYSICAL THERAPY LOWER EXTREMITY TREATMENT     Patient Name: [name] MRN: [id number] DOB:May 19, 1946, 78 y.o., female   END OF SESSION:   PT End of Session - 03/04/24 1028     Visit Number 5    Number of Visits 17    Date for PT Re-Evaluation 03/21/24    Authorization Type [employer]    Authorization Time Period no auth needed    PT Start Time 1020    PT Stop Time 1100    PT Time Calculation (min) 40 min    Activity Tolerance Patient tolerated treatment well;Patient limited by pain                    Past Medical History:  Diagnosis Date   Anxiety 12/29/2022   Arthritis     Asthma     Carpal tunnel syndrome     Depression     Dyspnea     Dysrhythmia     Gastric ulcer     GERD (gastroesophageal reflux disease)     H/O hiatal hernia     Heart murmur      since birth   History of heart murmur in childhood     History of kidney stones     Hypertension     Iron deficiency anemia     Neuropathy      Following neck surgery.   Paralysis (HCC)     PONV (postoperative nausea and vomiting)     Primary localized osteoarthritis of right knee 02/06/2024   Pulmonary embolus [facility])      November 2020   Type 2 diabetes mellitus [facility])               Past Surgical History:  Procedure Laterality Date   APPENDECTOMY       ATRIAL FIBRILLATION ABLATION N/A 10/09/2023    Procedure: ATRIAL FIBRILLATION ABLATION;  Surgeon: [name], MD;  Location: [facility], [place] INVASIVE CV LAB;  Service: Cardiovascular;  Laterality: N/A;   BIOPSY   06/01/2022    Procedure: BIOPSY;  Surgeon: Malissa Hippo, MD;  [facility];  Service: Endoscopy;;   CHOLECYSTECTOMY       COLONOSCOPY       COLONOSCOPY N/A 09/26/2013    Procedure: COLONOSCOPY;  Surgeon: Malissa Hippo, MD;  [facility];  Service: Endoscopy;  Laterality: N/A;  1030-rescheduled to 12:00pm Ann notified pt   COLONOSCOPY N/A 03/27/2015    Procedure: COLONOSCOPY;  Surgeon: Malissa Hippo,  MD;  [facility];  Service: Endoscopy;  Laterality: N/A;  11:15 - moved to 8:25 - Ann to notify pt   COLONOSCOPY WITH PROPOFOL N/A 06/01/2022    Procedure: COLONOSCOPY WITH PROPOFOL;  Surgeon: Malissa Hippo, MD;  [facility];  Service: Endoscopy;  Laterality: N/A;  1005 ASA 2   CYSTOSCOPY/URETEROSCOPY/HOLMIUM LASER/STENT PLACEMENT Left 12/23/2022    Procedure: CYSTOSCOPY/ LEFT RETROGRADE/ URETEROSCOPY/HOLMIUM LASER/STENT PLACEMENT;  Surgeon: Jerilee Field, MD;  Location: [facility];  Service: Urology;  Laterality: Left;   ESOPHAGOGASTRODUODENOSCOPY   12/08/2011    Procedure: ESOPHAGOGASTRODUODENOSCOPY (EGD);  Surgeon: Malissa Hippo, MD;  [facility]: [facility];  Service: Endoscopy;  Laterality: N/A;  3:00    ESOPHAGOGASTRODUODENOSCOPY N/A 03/27/2015    Procedure: ESOPHAGOGASTRODUODENOSCOPY (EGD);  Surgeon: Malissa Hippo, MD;  [facility];  Service: Endoscopy;  Laterality: N/A;   EYE SURGERY Bilateral      cataract surgery   GIVENS CAPSULE STUDY  N/A 11/04/2015    Procedure: GIVENS CAPSULE STUDY;  Surgeon: Malissa Hippo, MD;  [facility];  Service: Endoscopy;  Laterality: N/A;   Hysterscopy       KNEE ARTHROSCOPY        Left   NECK SURGERY        x 2    TONSILLECTOMY       TOTAL KNEE ARTHROPLASTY   07/11/2012    Procedure: TOTAL KNEE ARTHROPLASTY;  Surgeon: Loreta Ave, MD;  [facility]: [facility] OR;  Service: Orthopedics;  Laterality: Left;  left total knee arthroplasty   TOTAL KNEE ARTHROPLASTY Right 02/06/2024    Procedure: TOTAL KNEE ARTHROPLASTY;  Surgeon: Teryl Lucy, MD;  Location: [facility];  Service: Orthopedics;  Laterality: Right;   UPPER GASTROINTESTINAL ENDOSCOPY       YAG LASER APPLICATION Right 02/21/2017    Procedure: YAG LASER APPLICATION;  Surgeon: Jethro Bolus, MD;  Location: [facility];  Service: Ophthalmology;  Laterality: Right;            Patient Active Problem List    Diagnosis Date Noted   Primary localized osteoarthritis  of right knee 02/06/2024   S/P TKR (total knee replacement), right 02/06/2024   (HFpEF) heart failure with preserved ejection fraction (HCC) 07/04/2023   Paroxysmal atrial flutter (HCC) 07/04/2023   OSA (obstructive sleep apnea) 07/04/2023   DOE (dyspnea on exertion) 05/03/2023   Leg swelling 05/03/2023   History of pulmonary embolism 05/03/2023   Anxiety 12/29/2022   Kidney stones 12/29/2022   Ureteral colic 12/29/2022   Flank pain 12/28/2022   History of iron deficiency anemia 02/22/2022   IBS (irritable colon syndrome) 03/23/2021   Lower abdominal pain 03/23/2021   Well woman exam with routine gynecological exam 11/22/2017   Controlled type 2 diabetes mellitus (HCC) 11/02/2017   Nonspecific chest pain 11/02/2017   Bilateral leg edema     Diabetes (HCC) 03/28/2016   Pain in the chest     GERD (gastroesophageal reflux disease)     Chest pain 01/05/2015   Neuropathy 01/05/2015   Bronchitis     Guaiac positive hemoccult card on annual exam 05/08/2014   Chest pain at rest 11/14/2013   Left knee pain 09/18/2013   Salmonella 05/24/2013   Thrombocytopenia, unspecified (HCC) 05/24/2013   Acute gastroenteritis 05/21/2013   Fever 05/21/2013   Abnormal urinalysis 05/21/2013   Hypokalemia 05/21/2013   Dehydration 05/21/2013   Coccydynia 05/07/2013   Anemia 08/06/2012   PUD (peptic ulcer disease) 02/06/2012   Overweight 12/21/2010   Essential hypertension 12/29/2009   Asthma 12/29/2009   GERD 12/29/2009   Diaphragmatic hernia 12/29/2009   HEART MURMUR, SYSTOLIC 12/29/2009      PCP: Assunta Found, MD   REFERRING PROVIDER: Teryl Lucy, MD    REFERRING DIAG: S/P R TOTAL KNEE REPLACEMENT    THERAPY DIAG:  Primary osteoarthritis of right knee   Stiffness of right knee, not elsewhere classified   S/P TKR (total knee replacement), right   Rationale for Evaluation and Treatment: Rehabilitation   ONSET DATE: 02/06/24   SUBJECTIVE:    SUBJECTIVE STATEMENT: Pt reports  she feels like she can be done.  She is having no issues other than activity tolerance.  Stiffness but no pain.  Ambulating community distances without AD, steps no issue.     EVAL: Pt states that the incision is still being covered by butterfly bandages. Pt states that she has been getting around the house with the 2WRW but also furniture  walks occasionally. Pt reports that the pain is not present with rest although having to go to the ER due to leg pain on 02/08/24 immeiatley following surgery. Reported 5/10 pain upon presentation.   PERTINENT HISTORY: EVAL: Pt is a pleasant 78 year old female presenting S/P Right TKA. Pt states that before surgery, on December 1st, 2024, she started using a 2WRW because of the knee pain. Pt also had X-rays taken and injection therapy was attempted. Pt elected to have the surgery on 02/06/24 and reports no complications. PAIN:  Are you having pain? Yes: NPRS scale: 5/10 Pain location: R knee, mainly joint line and anterior, and incision area Pain description: bee stings, (nerve block) Aggravating factors: Walking,  Relieving factors: elevating it, ice, pain meds   PRECAUTIONS: Other: WBAT   RED FLAGS: None      WEIGHT BEARING RESTRICTIONS: Yes WBAT   FALLS:  Has patient fallen in last 6 months? No   LIVING ENVIRONMENT: Lives with: lives with their family and lives with their spouse Lives in: House/apartment Stairs: Yes: External: 2 steps; none Has following equipment at home: Dan Humphreys - 2 wheeled   OCCUPATION: retired   PLOF:  Started using the walker December the first due to the knee, cardiac ablation in fall.   PATIENT GOALS: Walk with no AD,    NEXT MD VISIT: 03/18/24 (had home [facility] with [facility])   OBJECTIVE:  Note: Objective measures were completed at Evaluation unless otherwise noted.   DIAGNOSTIC FINDINGS: Pt states having imaging done yesterday but is not in Chart this morning 2/26.   PATIENT SURVEYS:  LEFS 36/80    COGNITION: Overall cognitive status:  noticed some changes for the next couple days after surgery, loopy feeling                                  SENSATION: WFL   EDEMA:  Slight increase compared to the left     POSTURE: No Significant postural limitations   PALPATION: Tenderness to right knee globally, mostly over medial and lateral joint lines, and patella.   LOWER EXTREMITY ROM:   Active ROM Right eval Left eval Right 3/3 Right 03/04/24  Hip flexion        Hip extension        Hip abduction        Hip adduction        Hip internal rotation        Hip external rotation        Knee flexion 104 109 111 120  Knee extension -12 -2 -8 -3  Ankle dorsiflexion        Ankle plantarflexion        Ankle inversion        Ankle eversion         (Blank rows = not tested)   LOWER EXTREMITY MMT:   MMT Right eval Left eval Right 03/04/24  Hip flexion       Hip extension       Hip abduction       Hip adduction       Hip internal rotation       Hip external rotation       Knee flexion 3-   5  Knee extension 3   5  Ankle dorsiflexion       Ankle plantarflexion       Ankle inversion  Ankle eversion        (Blank rows = not tested)   LOWER EXTREMITY SPECIAL TESTS:             Not indicated at this time.   FUNCTIONAL TESTS:  2 minute walk test: 195   GAIT: Distance walked: 195 ft during Assistive device utilized: Walker - 2 wheeled Level of assistance: Modified independence Comments: Pt demonstrates WNL gait pattern with 2WRW with symmetrical stride length and stance time.                                                                                                                                   TREATMENT DATE:  03/04/24 Nustep 5 minutes level 3 LE only seat for warmup  FUNCTIONAL TESTS:  2 minute walk test: 03/04/24: 340 no AD, at evaluation 195 with RW LEFS 83.8% (was   ) MMT 5/5 within functional ROM AROM Rt knee -3 to 120 degrees HEP  education/walking program Review of goals    02/28/24 Bike seat 10 full revolutions 5 minutes Gait with SPC 120 feet step through gait, good stability Gait without AD 100 feet step through gait, good stability Standing: knee flexion stretch onto 12" box  Rt Knee flexion 10X  Tandem stance 2X30" each on foam beam, max of 8" without UE assist  SLS 1-2" max without UE assist X 5 trails each Stair negotiation 4"/7" 2RT reciprocally with bil UE assist Supine scar/knee inspection  AROM 115    02/26/24 Supine: Quad set 5" x 10 Heel slides x 10 Right knee AROM -8 to 111 Manual extension stretch x 5 Seated  Dangle x 1' Hamstring stretch 5 x 10" LAQ's 2# 2 x 10 Standing: heel raises x 10 Slant board 5 x 10" 8" box knee drives for flexion x 2' // bars walking with left UE support only to mimic walking with cane   02/22/2024  Supine R heel slides with a strap x 10 x 3 x 30" stretch at the start of each set Supine R SLR x 10 x 2 Hip vectors x 10 x 2 on each Mini Squats x 3" x 10  02/21/2024  Evaluation, Knee ROM and strength assessment, gait assessment, HEP prescription Therapeutic Exercise: -Seated LAQ, 1 set of 10 reps -Knee extension AAROM with LLE, 1 set of 10 reps. Pt cued to allow RLE to do most of the movement and just to use LLE for end range extension. Therapeutic Activity: -Sit to stands from standard Chair height with no UE support, 1 set of 8 reps (pt cued to avoid UE support if possible)       PATIENT EDUCATION:  Education details: Pt was instructed on POC, pain management, self-care, and HEP. Person educated: Patient and Spouse Education method: Explanation, Demonstration, Verbal cues, and Handouts Education comprehension: verbalized understanding, returned demonstration, and verbal cues required   HOME EXERCISE PROGRAM: Access Code: [facility] URL: [email or web address]  Date: 02/21/2024 Prepared by: Luz Lex   Exercises - Sit to Stand with  Arms Crossed  - 2 x daily - 7 x weekly - 3 sets - 10 reps - Seated Knee Extension AAROM  - 2 x daily - 7 x weekly - 3 sets - 10 reps - Seated Long Arc Quad  - 2 x daily - 7 x weekly - 3 sets - 10 reps   03/04/24:  SLS, tandem stance, walking program  ASSESSMENT:   CLINICAL IMPRESSION: Test measures completed today to check goal and progression status.   Pt is now ambulating mostly without AD, SPC outdoors at time and has no difficulty negotiating stairs reciprocally with 1 HR.  Pt aware of balance being not where she would like it or her activity tolerance.  Discussed and educated pt and spouse regarding a walking program and exercises that are most beneficial for her balance/stability.  All goals met at this time with exception of gait distance for 2 minute walk test due to fatigue.  Pt is agreeable to discharge at this time.         EVAL: Patient is a 78 y.o. female who was seen today for physical therapy evaluation and treatment for s/p RIGHT TKA.    OBJECTIVE IMPAIRMENTS: Abnormal gait, decreased balance, decreased endurance, decreased mobility, difficulty walking, decreased ROM, decreased strength, hypomobility, impaired sensation, and pain.    ACTIVITY LIMITATIONS: carrying, lifting, bending, sitting, standing, squatting, sleeping, stairs, transfers, bed mobility, and locomotion level   PARTICIPATION LIMITATIONS: meal prep, cleaning, laundry, and community activity   PERSONAL FACTORS: Age, Fitness, and 1-2 comorbidities: cardiac ablation procedure   are also affecting patient's functional outcome.    REHAB POTENTIAL: Good   CLINICAL DECISION MAKING: Stable/uncomplicated   EVALUATION COMPLEXITY: Low     GOALS: Goals reviewed with patient: Yes   SHORT TERM GOALS: Target date: 03/20/2024 Patient will demonstrate evidence of independence with individualized HEP and will report compliance for at least 3 days per week for optimized progression towards remaining therapy  goals. Baseline: NA Goal status: MET   2.  Patient will report a decrease in pain level during community ambulation by at least 2 points for improved quality of life. Baseline: 5/10 Goal status: MET       LONG TERM GOALS: Target date: 04/17/2024   Pt will demonstrate a an increase of at least 9 points on the LEFS for improved performance of community ambulation and ADL. Baseline: 02/21/24: Score 36/80 Goal status: MET; score today 67/80   2.  Pt will demonstrate WFL ROM (flexion and extension) in right knee, for increased mobility and maximal efficiency of gait cycle during ambulation. Baseline: 02/21/24: Flex: 104 Ext: -12 Goal status: MET   today -3 to 120 degrees   3.  Pt will demonstrate at least 4-/5 MMT for right lower extremity for increased strength during ADL and community ambulation. Baseline: 3- Goal status: MET  today 5/5 in available ROM   4.  Pt will demonstrate the ability to ambulate at least 463 feet during the 2 MWT for increased LE endurance and meet age predicted norms. Baseline: 02/21/24: 192 feet Goal status: NOT MET; today 340 feet with no AD         PLAN:   PT FREQUENCY: 2x/week   PT DURATION: 8 weeks   PLANNED INTERVENTIONS: 97110-Therapeutic exercises, 97530- Therapeutic activity, 97112- Neuromuscular re-education, 97535- Self Care, 16109- Manual therapy, [phone]- Gait training, Balance training, Stair training, Joint mobilization, DME instructions, Cryotherapy,  and Moist heat   PLAN FOR NEXT SESSION: Discharge to HEP.  3:06 PM, 03/04/24 Lurena Nida, PTA/CLT [facility] Ph: [phone]

## 2024-03-05 ENCOUNTER — Encounter (HOSPITAL_COMMUNITY): Payer: Self-pay

## 2024-03-05 DIAGNOSIS — M1711 Unilateral primary osteoarthritis, right knee: Secondary | ICD-10-CM | POA: Diagnosis not present

## 2024-03-05 NOTE — Therapy (Signed)
 [facility] Outpatient Rehabilitation at [facility] [address] [place], [place], [address] Phone: [phone]   Fax:  [phone]  Patient Details  Name: [name] MRN: [id number] Date of Birth: 01-13-1946 Referring Provider:  No ref. provider found  PHYSICAL THERAPY DISCHARGE SUMMARY  Visits from Start of Care: 5  Current functional level related to goals / functional outcomes: Goals met   Remaining deficits: See previous note   Education / Equipment: HEP   Patient agrees to discharge. Patient goals were met. Patient is being discharged due to meeting the stated rehab goals.   [facility] Outpatient Rehabilitation at [facility] [address] [place], [place], [address] Phone: [phone]   Fax:  [phone]

## 2024-03-06 ENCOUNTER — Encounter (HOSPITAL_COMMUNITY): Payer: HMO | Admitting: Physical Therapy

## 2024-03-07 ENCOUNTER — Encounter: Payer: Self-pay | Admitting: Internal Medicine

## 2024-03-07 ENCOUNTER — Ambulatory Visit: Payer: HMO | Attending: Internal Medicine | Admitting: Internal Medicine

## 2024-03-07 VITALS — BP 124/62 | HR 91 | Ht 64.0 in | Wt 187.2 lb

## 2024-03-07 DIAGNOSIS — I5032 Chronic diastolic (congestive) heart failure: Secondary | ICD-10-CM | POA: Insufficient documentation

## 2024-03-07 DIAGNOSIS — I480 Paroxysmal atrial fibrillation: Secondary | ICD-10-CM | POA: Diagnosis not present

## 2024-03-07 MED ORDER — METOPROLOL TARTRATE 25 MG PO TABS: 12.5000 mg | ORAL_TABLET | ORAL | Status: DC | PRN

## 2024-03-07 NOTE — Progress Notes (Signed)
 Cardiology Office Note  Date: 03/07/2024   ID: [name], [name] [birth year], MRN [id number]  PCP:  Assunta Found, MD  Cardiologist:  [name], MD Electrophysiologist:  [name], MD    History of Present Illness: [name] is a 78 y.o. female known to have chronic diastolic heart failure, paroxysmal atrial flutter, history of PE, DM 2, asthma is here for follow-up visit.   Event monitor from July 2024 showed 24% A-fib burden and 17 pauses, longest lasting 4.6 seconds.  She was referred to electrophysiology for antiarrhythmic options.  She underwent PVI ([facility]) and posterior wall ablation/isolation in October 2024 with no recurrence of atrial fibrillation.  EKG from 01/2024 showed normal sinus rhythm.  She reports elevated heart rates 1-2 times per week but she also has anxiety and her diazepam was reduced in frequency recently.  Asked her to talk to her PCP.  She also has tremors for which I instructed HER to speak with her PCP.  Otherwise, she does not have any other symptoms of DOE, leg swelling.  She reported that her leg swelling significantly improved/resolved after she started to elevate her legs.  Electrophysiology discontinued her diltiazem as it was thought that diltiazem was causing her leg swelling.  She does not have any other symptoms of dizziness, lightheadedness, presyncope, syncope, angina.  No DOE.  Past Medical History:  Diagnosis Date   Anxiety 12/29/2022   Arthritis    Asthma    Carpal tunnel syndrome    Depression    Dyspnea    Dysrhythmia    Gastric ulcer    GERD (gastroesophageal reflux disease)    H/O hiatal hernia    Heart murmur    since birth   History of heart murmur in childhood    History of kidney stones    Hypertension    Iron deficiency anemia    Neuropathy    Following neck surgery.   Paralysis (HCC)    PONV (postoperative nausea and vomiting)    Primary localized osteoarthritis of right knee 02/06/2024   Pulmonary  embolus [facility])    November 2020   Type 2 diabetes mellitus [facility])     Past Surgical History:  Procedure Laterality Date   APPENDECTOMY     ATRIAL FIBRILLATION ABLATION N/A 10/09/2023   Procedure: ATRIAL FIBRILLATION ABLATION;  Surgeon: [name], MD;  Location: [facility] INVASIVE CV LAB;  Service: Cardiovascular;  Laterality: N/A;   BIOPSY  06/01/2022   Procedure: BIOPSY;  Surgeon: Malissa Hippo, MD;  [facility];  Service: Endoscopy;;   CHOLECYSTECTOMY     COLONOSCOPY     COLONOSCOPY N/A 09/26/2013   Procedure: COLONOSCOPY;  Surgeon: Malissa Hippo, MD;  [facility];  Service: Endoscopy;  Laterality: N/A;  1030-rescheduled to 12:00pm Ann notified pt   COLONOSCOPY N/A 03/27/2015   Procedure: COLONOSCOPY;  Surgeon: Malissa Hippo, MD;  [facility];  Service: Endoscopy;  Laterality: N/A;  11:15 - moved to 8:25 - Ann to notify pt   COLONOSCOPY WITH PROPOFOL N/A 06/01/2022   Procedure: COLONOSCOPY WITH PROPOFOL;  Surgeon: Malissa Hippo, MD;  [facility];  Service: Endoscopy;  Laterality: N/A;  1005 ASA 2   CYSTOSCOPY/URETEROSCOPY/HOLMIUM LASER/STENT PLACEMENT Left 12/23/2022   Procedure: CYSTOSCOPY/ LEFT RETROGRADE/ URETEROSCOPY/HOLMIUM LASER/STENT PLACEMENT;  Surgeon: Jerilee Field, MD;  Location: [facility];  Service: Urology;  Laterality: Left;   ESOPHAGOGASTRODUODENOSCOPY  12/08/2011   Procedure: ESOPHAGOGASTRODUODENOSCOPY (EGD);  Surgeon: Joline Maxcy  Karilyn Cota, MD;  [facility];  Service: Endoscopy;  Laterality: N/A;  3:00    ESOPHAGOGASTRODUODENOSCOPY N/A 03/27/2015   Procedure: ESOPHAGOGASTRODUODENOSCOPY (EGD);  Surgeon: Malissa Hippo, MD;  [facility];  Service: Endoscopy;  Laterality: N/A;   EYE SURGERY Bilateral    cataract surgery   GIVENS CAPSULE STUDY N/A 11/04/2015   Procedure: GIVENS CAPSULE STUDY;  Surgeon: Malissa Hippo, MD;  [facility];  Service: Endoscopy;  Laterality: N/A;   Hysterscopy      KNEE ARTHROSCOPY     Left   NECK SURGERY     x 2    TONSILLECTOMY     TOTAL KNEE ARTHROPLASTY  07/11/2012   Procedure: TOTAL KNEE ARTHROPLASTY;  Surgeon: Loreta Ave, MD;  [facility] OR;  Service: Orthopedics;  Laterality: Left;  left total knee arthroplasty   TOTAL KNEE ARTHROPLASTY Right 02/06/2024   Procedure: TOTAL KNEE ARTHROPLASTY;  Surgeon: Teryl Lucy, MD;  Location: [facility];  Service: Orthopedics;  Laterality: Right;   UPPER GASTROINTESTINAL ENDOSCOPY     YAG LASER APPLICATION Right 02/21/2017   Procedure: YAG LASER APPLICATION;  Surgeon: Jethro Bolus, MD;  Location: [facility];  Service: Ophthalmology;  Laterality: Right;    Current Outpatient Medications  Medication Sig Dispense Refill   acetaminophen (TYLENOL) 500 MG tablet Take 2 tablets (1,000 mg total) by mouth every 8 (eight) hours. 180 tablet 0   albuterol (VENTOLIN HFA) 108 (90 Base) MCG/ACT inhaler Inhale 1-2 puffs into the lungs every 4 (four) hours as needed for wheezing or shortness of breath.     cholecalciferol (VITAMIN D3) 25 MCG (1000 UNIT) tablet Take 1,000 Units by mouth daily.     cyanocobalamin (VITAMIN B12) 1000 MCG tablet Take 1,000 mcg by mouth daily.     diazepam (VALIUM) 5 MG tablet Take 5 mg by mouth every 8 (eight) hours as needed for anxiety.     Elastic Bandages & Supports (MEDICAL COMPRESSION STOCKINGS) MISC 1 each by Does not apply route as directed. Knew High Medium Pressure Compression stockings Dx: bilateral lower extremity edema 1 each 0   ferrous sulfate 325 (65 FE) MG tablet Take 1 tablet (325 mg total) by mouth daily with breakfast.     furosemide (LASIX) 20 MG tablet TAKE 1 TABLET BY MOUTH TWICE DAILY.TAKE AN ADDITIONAL TABLET DAILY AS NEEDED FOR SHORTNESS OF BREATH 270 tablet 1   gabapentin (NEURONTIN) 300 MG capsule Take 300 mg by mouth at bedtime.     KLOR-CON M20 20 MEQ tablet TAKE 1 TABLET BY MOUTH EVERY DAY 90 tablet 1   metFORMIN (GLUCOPHAGE) 500 MG tablet Take 500 mg by mouth 2 (two)  times daily with a meal.      metoprolol tartrate (LOPRESSOR) 25 MG tablet Take 0.5 tablets (12.5 mg total) by mouth as needed (hr over 100 bpm).     montelukast (SINGULAIR) 10 MG tablet Take 10 mg by mouth every morning.     NON FORMULARY Pt uses a cpap     omeprazole (PRILOSEC) 20 MG capsule Take 20 mg by mouth 2 (two) times daily before a meal.     Phenazopyrid-Cranbry-C-Probiot (AZO URINARY TRACT SUPPORT PO) Take 2 tablets by mouth daily.     Probiotic Product (PROBIOTIC DAILY PO) Take 1 capsule by mouth daily.     rivaroxaban (XARELTO) 20 MG TABS tablet Take 20 mg by mouth daily with supper.     Turmeric 450 MG CAPS Take 450 mg by mouth daily.  baclofen (LIORESAL) 10 MG tablet Take 1 tablet (10 mg total) by mouth 3 (three) times daily. As needed for muscle spasm (Patient not taking: Reported on 03/07/2024) 50 tablet 0   oxyCODONE (ROXICODONE) 5 MG immediate release tablet Take 1 tablet (5 mg total) by mouth every 4 (four) hours as needed for severe pain (pain score 7-10). (Patient not taking: Reported on 03/07/2024) 30 tablet 0   No current facility-administered medications for this visit.   Allergies:  Nsaids, Cephalexin, Darvocet [propoxyphene n-acetaminophen], Percocet [oxycodone-acetaminophen], and Penicillins   Social History: The patient  reports that she has never smoked. She has never been exposed to tobacco smoke. She has never used smokeless tobacco. She reports that she does not drink alcohol and does not use drugs.   Family History: The patient's family history includes Alzheimer's disease in her mother; Cancer in her maternal aunt and paternal grandmother; Diabetes in her maternal grandfather and maternal grandmother; Hypertension in her son; Kidney disease in her father, mother, and paternal uncle; Liver disease in her maternal uncle.   ROS:  Please see the history of present illness. Otherwise, complete review of systems is positive for none  All other systems are reviewed  and negative.   Physical Exam: VS:  BP 124/62   Pulse 91   Ht 5\' 4"  (1.626 m)   Wt 187 lb 3.2 oz (84.9 kg)   SpO2 97%   BMI 32.13 kg/m , BMI Body mass index is 32.13 kg/m.  Wt Readings from Last 3 Encounters:  03/07/24 187 lb 3.2 oz (84.9 kg)  02/08/24 194 lb (88 kg)  02/06/24 194 lb 12.8 oz (88.4 kg)    General: Patient appears comfortable at rest. HEENT: Conjunctiva and lids normal, oropharynx clear with moist mucosa. Neck: Supple, no elevated JVP or carotid bruits, no thyromegaly. Lungs: Clear to auscultation, nonlabored breathing at rest. Cardiac: Regular rate and rhythm, no S3 or significant systolic murmur, no pericardial rub. Abdomen: Soft, nontender, no hepatomegaly, bowel sounds present, no guarding or rebound. Extremities: No pitting edema, distal pulses 2+. Skin: Warm and dry. Musculoskeletal: No kyphosis. Neuropsychiatric: Alert and oriented x3, affect grossly appropriate.  Recent Labwork: 05/09/2023: TSH 1.020 08/31/2023: BNP 35.8 02/08/2024: ALT 34; AST 31; BUN 22; Creatinine, Ser 0.99; Hemoglobin 11.8; Platelets 245; Potassium 3.6; Sodium 141     Component Value Date/Time   CHOL 113 11/03/2017 0515   TRIG 65 10/27/2019 0010   HDL 59 11/03/2017 0515   CHOLHDL 1.9 11/03/2017 0515   VLDL 9 11/03/2017 0515   LDLCALC 45 11/03/2017 0515     Assessment and Plan:   Paroxysmal A-fib s/p PVI in October 2024: No recurrence of A-fib or atrial flutter post ablation.  EKG from 01/2024 showed normal sinus rhythm.  She reports elevated heart rates at home occurring 1-2 times per week.  She thinks this could be from anxiety and not A-fib.  She was previously on diltiazem that was discontinued by EP due to concern for bilateral lower EXTR swelling.  She has metoprolol pills at home, can take metoprolol 12.5 mg twice daily as needed for HR more than 100 or in A-fib.  Continue Xarelto 20 mg daily with supper.  She was diagnosed with mild OSA, will reach out to Dr. Norris Cross office  for further recommendations.  Mild OSA: Home sleep study showed mild OSA.  Follow-up with Dr. Mayford Knife.  Chronic diastolic heart failure: Compensated, continue p.o. Lasix 20 mg once daily.  Previously she had lower EXTR swelling that was  thought to be from diltiazem discontinued.  Patient also reported that after she started to elevate her legs, swelling completely resolved.  Could be from chronic venous insufficiency.  HTN, DM2: Management per PCP.Marland Kitchen    Medication Adjustments/Labs and Tests Ordered: Current medicines are reviewed at length with the patient today.  Concerns regarding medicines are outlined above.    Disposition:  Follow up 10 months  Signed Angela Vazguez Verne Spurr, MD, 03/07/2024 11:55 AM    [facility] at [facility] [address] [place], [place], [place] [address]

## 2024-03-07 NOTE — Patient Instructions (Addendum)
 Medication Instructions:  Your physician has recommended you make the following change in your medication:   -Start Metoprolol as needed for heart rate over 100 bpm   *If you need a refill on your cardiac medications before your next appointment, please call your pharmacy*   Lab Work: None If you have labs (blood work) drawn today and your tests are completely normal, you will receive your results only by: MyChart Message (if you have MyChart) OR A paper copy in the mail If you have any lab test that is abnormal or we need to change your treatment, we will call you to review the results.   Testing/Procedures: None   Follow-Up: At [facility], you and your health needs are our priority.  As part of our continuing mission to provide you with exceptional heart care, we have created designated Provider Care Teams.  These Care Teams include your primary Cardiologist (physician) and Advanced Practice Providers (APPs -  Physician Assistants and Nurse Practitioners) who all work together to provide you with the care you need, when you need it.  We recommend signing up for the patient portal called "MyChart".  Sign up information is provided on this After Visit Summary.  MyChart is used to connect with patients for Virtual Visits (Telemedicine).  Patients are able to view lab/test results, encounter notes, upcoming appointments, etc.  Non-urgent messages can be sent to your provider as well.   To learn more about what you can do with MyChart, go to [email or web address].    Your next appointment:   10 months  Provider:   You may see Vishnu P Mallipeddi, MD or one of the following Advanced Practice Providers on your designated Care Team:   [place], PA-C  Jacolyn Reedy, [place]     Other Instructions

## 2024-03-11 ENCOUNTER — Encounter (HOSPITAL_COMMUNITY): Payer: HMO

## 2024-03-13 ENCOUNTER — Encounter (HOSPITAL_COMMUNITY): Payer: HMO

## 2024-03-18 ENCOUNTER — Encounter (HOSPITAL_COMMUNITY): Payer: HMO | Admitting: Physical Therapy

## 2024-03-20 ENCOUNTER — Encounter (HOSPITAL_COMMUNITY): Payer: HMO | Admitting: Physical Therapy

## 2024-03-25 ENCOUNTER — Ambulatory Visit: Payer: HMO | Admitting: Urology

## 2024-03-25 ENCOUNTER — Encounter (HOSPITAL_COMMUNITY): Payer: HMO

## 2024-03-25 DIAGNOSIS — E1159 Type 2 diabetes mellitus with other circulatory complications: Secondary | ICD-10-CM | POA: Diagnosis not present

## 2024-03-25 DIAGNOSIS — K589 Irritable bowel syndrome without diarrhea: Secondary | ICD-10-CM | POA: Diagnosis not present

## 2024-03-25 DIAGNOSIS — Z6834 Body mass index (BMI) 34.0-34.9, adult: Secondary | ICD-10-CM | POA: Diagnosis not present

## 2024-03-25 DIAGNOSIS — I10 Essential (primary) hypertension: Secondary | ICD-10-CM | POA: Diagnosis not present

## 2024-03-25 DIAGNOSIS — Z01818 Encounter for other preprocedural examination: Secondary | ICD-10-CM | POA: Diagnosis not present

## 2024-03-25 DIAGNOSIS — Z1331 Encounter for screening for depression: Secondary | ICD-10-CM | POA: Diagnosis not present

## 2024-03-25 DIAGNOSIS — E782 Mixed hyperlipidemia: Secondary | ICD-10-CM | POA: Diagnosis not present

## 2024-03-25 DIAGNOSIS — Z0001 Encounter for general adult medical examination with abnormal findings: Secondary | ICD-10-CM | POA: Diagnosis not present

## 2024-03-25 DIAGNOSIS — E6609 Other obesity due to excess calories: Secondary | ICD-10-CM | POA: Diagnosis not present

## 2024-03-25 DIAGNOSIS — I480 Paroxysmal atrial fibrillation: Secondary | ICD-10-CM | POA: Diagnosis not present

## 2024-03-25 DIAGNOSIS — F419 Anxiety disorder, unspecified: Secondary | ICD-10-CM | POA: Diagnosis not present

## 2024-03-30 ENCOUNTER — Telehealth: Admitting: Nurse Practitioner

## 2024-03-30 ENCOUNTER — Encounter: Payer: Self-pay | Admitting: Nurse Practitioner

## 2024-03-30 DIAGNOSIS — U071 COVID-19: Secondary | ICD-10-CM | POA: Diagnosis not present

## 2024-03-30 MED ORDER — MOLNUPIRAVIR EUA 200MG CAPSULE
4.0000 | ORAL_CAPSULE | Freq: Two times a day (BID) | ORAL | 0 refills | Status: AC
Start: 2024-03-30 — End: 2024-04-04

## 2024-03-30 NOTE — Progress Notes (Signed)
 Virtual Visit Consent   [name], you are scheduled for a virtual visit with a [facility] provider today. Just as with appointments in the office, your consent must be obtained to participate. Your consent will be active for this visit and any virtual visit you may have with one of our providers in the next 365 days. If you have a MyChart account, a copy of this consent can be sent to you electronically.  As this is a virtual visit, video technology does not allow for your provider to perform a traditional examination. This may limit your provider's ability to fully assess your condition. If your provider identifies any concerns that need to be evaluated in person or the need to arrange testing (such as labs, EKG, etc.), we will make arrangements to do so. Although advances in technology are sophisticated, we cannot ensure that it will always work on either your end or our end. If the connection with a video visit is poor, the visit may have to be switched to a telephone visit. With either a video or telephone visit, we are not always able to ensure that we have a secure connection.  By engaging in this virtual visit, you consent to the provision of healthcare and authorize for your insurance to be billed (if applicable) for the services provided during this visit. Depending on your insurance coverage, you may receive a charge related to this service.  I need to obtain your verbal consent now. Are you willing to proceed with your visit today? AIREANNA LUELLEN has provided verbal consent on 03/30/2024 for a virtual visit (video or telephone). Claiborne Rigg, NP  Date: 03/30/2024 11:51 AM   Virtual Visit via Video Note   I, Claiborne Rigg, connected with  [name]  ([id number], 03-12-1946) on 03/30/24 at 11:45 AM EDT by a video-enabled telemedicine application and verified that I am speaking with the correct person using two identifiers.  Location: Patient: Virtual Visit Location Patient:  Home Provider: Virtual Visit Location Provider: Home Office   I discussed the limitations of evaluation and management by telemedicine and the availability of in person appointments. The patient expressed understanding and agreed to proceed.    History of Present Illness: [name] is a 78 y.o. who identifies as a female who was assigned female at birth, and is being seen today for COVID POSITIVE.  Husband has been sick with COVID over the past 4 days and currently on Paxlovid. She tested positive for COVID at home today. Sore throat, fever 99.5, nasal congestion on right, cough, headache. Only taking tylenol at the moment. Onset of symptoms was 24-48 hours ago. BP 130/65 HR 93. O2 Sats 96%.    Problems:  Patient Active Problem List   Diagnosis Date Noted   Chronic diastolic heart failure (HCC) 03/07/2024   PAF (paroxysmal atrial fibrillation) (HCC) 03/07/2024   Primary localized osteoarthritis of right knee 02/06/2024   S/P TKR (total knee replacement), right 02/06/2024   (HFpEF) heart failure with preserved ejection fraction (HCC) 07/04/2023   Paroxysmal atrial flutter (HCC) 07/04/2023   OSA (obstructive sleep apnea) 07/04/2023   DOE (dyspnea on exertion) 05/03/2023   Leg swelling 05/03/2023   History of pulmonary embolism 05/03/2023   Anxiety 12/29/2022   Kidney stones 12/29/2022   Ureteral colic 12/29/2022   Flank pain 12/28/2022   History of iron deficiency anemia 02/22/2022   IBS (irritable colon syndrome) 03/23/2021   Lower abdominal pain 03/23/2021   Well woman  exam with routine gynecological exam 11/22/2017   Controlled type 2 diabetes mellitus (HCC) 11/02/2017   Nonspecific chest pain 11/02/2017   Bilateral leg edema    Diabetes (HCC) 03/28/2016   Pain in the chest    GERD (gastroesophageal reflux disease)    Chest pain 01/05/2015   Neuropathy 01/05/2015   Bronchitis    Guaiac positive hemoccult card on annual exam 05/08/2014   Chest pain at rest 11/14/2013    Left knee pain 09/18/2013   Salmonella 05/24/2013   Thrombocytopenia, unspecified (HCC) 05/24/2013   Acute gastroenteritis 05/21/2013   Fever 05/21/2013   Abnormal urinalysis 05/21/2013   Hypokalemia 05/21/2013   Dehydration 05/21/2013   Coccydynia 05/07/2013   Anemia 08/06/2012   PUD (peptic ulcer disease) 02/06/2012   Overweight 12/21/2010   Essential hypertension 12/29/2009   Asthma 12/29/2009   GERD 12/29/2009   Diaphragmatic hernia 12/29/2009   HEART MURMUR, SYSTOLIC 12/29/2009    Allergies:  Allergies  Allergen Reactions   Nsaids Other (See Comments)    Caused Ulcers.   Cephalexin     unknown   Darvocet [Propoxyphene N-Acetaminophen] Nausea Only   Percocet [Oxycodone-Acetaminophen] Nausea Only   Penicillins Rash and Other (See Comments)    Has patient had a PCN reaction causing immediate rash, facial/tongue/throat swelling, SOB or lightheadedness with hypotension: Yes Has patient had a PCN reaction causing severe rash involving mucus membranes or skin necrosis: No Has patient had a PCN reaction that required hospitalization No Has patient had a PCN reaction occurring within the last 10 years: Yes If all of the above answers are "NO", then may proceed with Cephalosporin use.    Medications:  Current Outpatient Medications:    molnupiravir EUA (LAGEVRIO) 200 mg CAPS capsule, Take 4 capsules (800 mg total) by mouth 2 (two) times daily for 5 days., Disp: 40 capsule, Rfl: 0   albuterol (VENTOLIN HFA) 108 (90 Base) MCG/ACT inhaler, Inhale 1-2 puffs into the lungs every 4 (four) hours as needed for wheezing or shortness of breath., Disp: , Rfl:    baclofen (LIORESAL) 10 MG tablet, Take 1 tablet (10 mg total) by mouth 3 (three) times daily. As needed for muscle spasm (Patient not taking: Reported on 03/07/2024), Disp: 50 tablet, Rfl: 0   cholecalciferol (VITAMIN D3) 25 MCG (1000 UNIT) tablet, Take 1,000 Units by mouth daily., Disp: , Rfl:    cyanocobalamin (VITAMIN B12) 1000 MCG  tablet, Take 1,000 mcg by mouth daily., Disp: , Rfl:    diazepam (VALIUM) 5 MG tablet, Take 5 mg by mouth every 8 (eight) hours as needed for anxiety., Disp: , Rfl:    Elastic Bandages & Supports (MEDICAL COMPRESSION STOCKINGS) MISC, 1 each by Does not apply route as directed. Knew High Medium Pressure Compression stockings Dx: bilateral lower extremity edema, Disp: 1 each, Rfl: 0   ferrous sulfate 325 (65 FE) MG tablet, Take 1 tablet (325 mg total) by mouth daily with breakfast., Disp: , Rfl:    furosemide (LASIX) 20 MG tablet, TAKE 1 TABLET BY MOUTH TWICE DAILY.TAKE AN ADDITIONAL TABLET DAILY AS NEEDED FOR SHORTNESS OF BREATH, Disp: 270 tablet, Rfl: 1   gabapentin (NEURONTIN) 300 MG capsule, Take 300 mg by mouth at bedtime., Disp: , Rfl:    KLOR-CON M20 20 MEQ tablet, TAKE 1 TABLET BY MOUTH EVERY DAY, Disp: 90 tablet, Rfl: 1   metFORMIN (GLUCOPHAGE) 500 MG tablet, Take 500 mg by mouth 2 (two) times daily with a meal. , Disp: , Rfl:    metoprolol  tartrate (LOPRESSOR) 25 MG tablet, Take 0.5 tablets (12.5 mg total) by mouth as needed (hr over 100 bpm)., Disp: , Rfl:    montelukast (SINGULAIR) 10 MG tablet, Take 10 mg by mouth every morning., Disp: , Rfl:    NON FORMULARY, Pt uses a cpap, Disp: , Rfl:    omeprazole (PRILOSEC) 20 MG capsule, Take 20 mg by mouth 2 (two) times daily before a meal., Disp: , Rfl:    Phenazopyrid-Cranbry-C-Probiot (AZO URINARY TRACT SUPPORT PO), Take 2 tablets by mouth daily., Disp: , Rfl:    Probiotic Product (PROBIOTIC DAILY PO), Take 1 capsule by mouth daily., Disp: , Rfl:    rivaroxaban (XARELTO) 20 MG TABS tablet, Take 20 mg by mouth daily with supper., Disp: , Rfl:    Turmeric 450 MG CAPS, Take 450 mg by mouth daily., Disp: , Rfl:   Observations/Objective: Patient is well-developed, well-nourished in no acute distress.  Resting comfortably at home.  Head is normocephalic, atraumatic.  No labored breathing.  Speech is clear and coherent with logical content.   Patient is alert and oriented at baseline.    Assessment and Plan: 1. Positive self-administered antigen test for COVID-19 (Primary) - molnupiravir EUA (LAGEVRIO) 200 mg CAPS capsule; Take 4 capsules (800 mg total) by mouth 2 (two) times daily for 5 days.  Dispense: 40 capsule; Refill: 0    Follow Up Instructions: I discussed the assessment and treatment plan with the patient. The patient was provided an opportunity to ask questions and all were answered. The patient agreed with the plan and demonstrated an understanding of the instructions.  A copy of instructions were sent to the patient via MyChart unless otherwise noted below.    The patient was advised to call back or seek an in-person evaluation if the symptoms worsen or if the condition fails to improve as anticipated.    Claiborne Rigg, NP

## 2024-03-30 NOTE — Patient Instructions (Signed)
 [name], thank you for joining Claiborne Rigg, NP for today's virtual visit.  While this provider is not your primary care provider (PCP), if your PCP is located in our provider database this encounter information will be shared with them immediately following your visit.   A [facility] MyChart account gives you access to today's visit and all your visits, tests, and labs performed at [facility] " click here if you don't have a  MyChart account or go to [email or web address]  Consent: (Patient) [name] provided verbal consent for this virtual visit at the beginning of the encounter.  Current Medications:  Current Outpatient Medications:    molnupiravir EUA (LAGEVRIO) 200 mg CAPS capsule, Take 4 capsules (800 mg total) by mouth 2 (two) times daily for 5 days., Disp: 40 capsule, Rfl: 0   albuterol (VENTOLIN HFA) 108 (90 Base) MCG/ACT inhaler, Inhale 1-2 puffs into the lungs every 4 (four) hours as needed for wheezing or shortness of breath., Disp: , Rfl:    baclofen (LIORESAL) 10 MG tablet, Take 1 tablet (10 mg total) by mouth 3 (three) times daily. As needed for muscle spasm (Patient not taking: Reported on 03/07/2024), Disp: 50 tablet, Rfl: 0   cholecalciferol (VITAMIN D3) 25 MCG (1000 UNIT) tablet, Take 1,000 Units by mouth daily., Disp: , Rfl:    cyanocobalamin (VITAMIN B12) 1000 MCG tablet, Take 1,000 mcg by mouth daily., Disp: , Rfl:    diazepam (VALIUM) 5 MG tablet, Take 5 mg by mouth every 8 (eight) hours as needed for anxiety., Disp: , Rfl:    Elastic Bandages & Supports (MEDICAL COMPRESSION STOCKINGS) MISC, 1 each by Does not apply route as directed. Knew High Medium Pressure Compression stockings Dx: bilateral lower extremity edema, Disp: 1 each, Rfl: 0   ferrous sulfate 325 (65 FE) MG tablet, Take 1 tablet (325 mg total) by mouth daily with breakfast., Disp: , Rfl:    furosemide (LASIX) 20 MG tablet, TAKE 1 TABLET BY MOUTH TWICE DAILY.TAKE AN  ADDITIONAL TABLET DAILY AS NEEDED FOR SHORTNESS OF BREATH, Disp: 270 tablet, Rfl: 1   gabapentin (NEURONTIN) 300 MG capsule, Take 300 mg by mouth at bedtime., Disp: , Rfl:    KLOR-CON M20 20 MEQ tablet, TAKE 1 TABLET BY MOUTH EVERY DAY, Disp: 90 tablet, Rfl: 1   metFORMIN (GLUCOPHAGE) 500 MG tablet, Take 500 mg by mouth 2 (two) times daily with a meal. , Disp: , Rfl:    metoprolol tartrate (LOPRESSOR) 25 MG tablet, Take 0.5 tablets (12.5 mg total) by mouth as needed (hr over 100 bpm)., Disp: , Rfl:    montelukast (SINGULAIR) 10 MG tablet, Take 10 mg by mouth every morning., Disp: , Rfl:    NON FORMULARY, Pt uses a cpap, Disp: , Rfl:    omeprazole (PRILOSEC) 20 MG capsule, Take 20 mg by mouth 2 (two) times daily before a meal., Disp: , Rfl:    Phenazopyrid-Cranbry-C-Probiot (AZO URINARY TRACT SUPPORT PO), Take 2 tablets by mouth daily., Disp: , Rfl:    Probiotic Product (PROBIOTIC DAILY PO), Take 1 capsule by mouth daily., Disp: , Rfl:    rivaroxaban (XARELTO) 20 MG TABS tablet, Take 20 mg by mouth daily with supper., Disp: , Rfl:    Turmeric 450 MG CAPS, Take 450 mg by mouth daily., Disp: , Rfl:    Medications ordered in this encounter:  Meds ordered this encounter  Medications   molnupiravir EUA (LAGEVRIO) 200 mg CAPS capsule    Sig: Take  4 capsules (800 mg total) by mouth 2 (two) times daily for 5 days.    Dispense:  40 capsule    Refill:  0    Supervising Provider:   Merrilee Jansky [[id number]]     *If you need refills on other medications prior to your next appointment, please contact your pharmacy*  Follow-Up: Call back or seek an in-person evaluation if the symptoms worsen or if the condition fails to improve as anticipated.  [facility] [phone]  Other Instructions  Please keep well-hydrated and get plenty of rest. Start a saline nasal rinse to flush out your nasal passages. You can use plain Mucinex to help thin congestion. If you have a humidifier, you  can use this daily as needed.    You are to wear a mask for 5 days from onset of your symptoms.  After day 5, if you have had no fever and you are feeling better with NO symptoms, you can end masking. Keep in mind you can be contagious 10 days from the onset of symptoms  After day 5 if you have a fever or are having significant symptoms, please wear your mask for full 10 days.   If you note any worsening of symptoms, any significant shortness of breath or any chest pain, please seek ER evaluation ASAP.  Please do not delay care!    If you note any worsening of symptoms, any significant shortness of breath or any chest pain, please seek ER evaluation ASAP.  Please do not delay care!    If you have been instructed to have an in-person evaluation today at a local Urgent Care facility, please use the link below. It will take you to a list of all of our available [facility], including address, phone number and hours of operation. Please do not delay care.  [facility]  If you or a family member do not have a primary care provider, use the link below to schedule a visit and establish care. When you choose a [facility] primary care physician or advanced practice provider, you gain a long-term partner in health. Find a Primary Care Provider  Learn more about [facility]'s in-office and virtual care options: [facility] Get [facility] Now

## 2024-04-03 ENCOUNTER — Telehealth (INDEPENDENT_AMBULATORY_CARE_PROVIDER_SITE_OTHER): Payer: Self-pay

## 2024-04-03 NOTE — Telephone Encounter (Signed)
 Patient called wanting an script for her ppi Omeprazole. I advised that we could not send in a script for her as she was last seen well over a year ago. Last seen 08/22/2022. I advised she could schedule an appointment her to get a new script for this or call PCP to have him prescribe until she could get in here, which is toward the End of May 2025. Patient states understanding and will call her PCP to fill this for now. Patient will call back to schedule an appointment.

## 2024-04-08 DIAGNOSIS — M1711 Unilateral primary osteoarthritis, right knee: Secondary | ICD-10-CM | POA: Diagnosis not present

## 2024-04-25 DIAGNOSIS — F5101 Primary insomnia: Secondary | ICD-10-CM | POA: Diagnosis not present

## 2024-04-25 DIAGNOSIS — D509 Iron deficiency anemia, unspecified: Secondary | ICD-10-CM | POA: Diagnosis not present

## 2024-04-25 DIAGNOSIS — I10 Essential (primary) hypertension: Secondary | ICD-10-CM | POA: Diagnosis not present

## 2024-04-25 DIAGNOSIS — E6609 Other obesity due to excess calories: Secondary | ICD-10-CM | POA: Diagnosis not present

## 2024-04-25 DIAGNOSIS — E782 Mixed hyperlipidemia: Secondary | ICD-10-CM | POA: Diagnosis not present

## 2024-04-25 DIAGNOSIS — E1159 Type 2 diabetes mellitus with other circulatory complications: Secondary | ICD-10-CM | POA: Diagnosis not present

## 2024-04-25 DIAGNOSIS — Z6832 Body mass index (BMI) 32.0-32.9, adult: Secondary | ICD-10-CM | POA: Diagnosis not present

## 2024-04-25 DIAGNOSIS — I480 Paroxysmal atrial fibrillation: Secondary | ICD-10-CM | POA: Diagnosis not present

## 2024-05-01 ENCOUNTER — Other Ambulatory Visit: Payer: Self-pay | Admitting: Internal Medicine

## 2024-05-13 ENCOUNTER — Encounter: Payer: Self-pay | Admitting: Urology

## 2024-05-13 ENCOUNTER — Ambulatory Visit: Admitting: Urology

## 2024-05-13 VITALS — BP 137/69 | HR 83

## 2024-05-13 DIAGNOSIS — N200 Calculus of kidney: Secondary | ICD-10-CM

## 2024-05-13 DIAGNOSIS — N3941 Urge incontinence: Secondary | ICD-10-CM | POA: Diagnosis not present

## 2024-05-13 DIAGNOSIS — N281 Cyst of kidney, acquired: Secondary | ICD-10-CM | POA: Diagnosis not present

## 2024-05-13 LAB — URINALYSIS, ROUTINE W REFLEX MICROSCOPIC
Bilirubin, UA: NEGATIVE
Glucose, UA: NEGATIVE
Ketones, UA: NEGATIVE
Nitrite, UA: NEGATIVE
Protein,UA: NEGATIVE
RBC, UA: NEGATIVE
Specific Gravity, UA: 1.015 (ref 1.005–1.030)
Urobilinogen, Ur: 0.2 mg/dL (ref 0.2–1.0)
pH, UA: 6.5 (ref 5.0–7.5)

## 2024-05-13 LAB — MICROSCOPIC EXAMINATION
Bacteria, UA: NONE SEEN
RBC, Urine: NONE SEEN /HPF (ref 0–2)

## 2024-05-13 NOTE — Progress Notes (Unsigned)
 05/13/2024 11:23 AM   [name] Broker 06/11/1946 [id number]  Referring provider: [name], [name] [address] [place],  [place] [address]  No chief complaint on file.   HPI:  F/u -    1) left renal stone-She saw Dr. Inga [name]. She passed two stones yrs ago but never any surgery for stone. A CT Mar 2022 showed a 15 mm left renal pelvic/midpole stone (visible on the scout, SSD 9 cm, HU 1300).  US  November 2023 revealed a stable 14 mm left renal pelvic stone.  She also has a 5.8 cm right upper pole cyst. Dec 2023 KUB measured left stone at 2.2 cm/. Dec 2023 CT more accurately showed it to be 17 mm left renal stone. She underwent Dec 2023 left URS/HLL/stent. The stent was pulled out when she transferred to the bed in the OR. She did well initially, but was admitted for uncontrolled left flank pain Jan 2024 and a CT showed left hydro, fragments in the left lower pole and a few fragments in the left distal ureter up to 5-6 mm. She eventually passed the frgments. follow-up ultrasound February 2024 which showed no hydronephrosis and a 6 mm lower pole echogenic focus. She had jets bilaterally. February 2024 KUB was negative.    2) gross hematuria - she had red urine in Nov 2023. US , CT 2023 benign. Cysto benign 2023.     3) UTI - she had three episodes of dysuria and UTI. Responds to abx.    4) urgency , UUI - foot on the floor. Can't get to bathroom time.   Today, seen for the above. A mar 2025 renal US  - No hydronephrosis. A 5.9 cm partially exophytic superior pole right renal cyst. A 5 mm left renal stone. She does have some left flank pain. She is on probiotics. Start d-mannose. She weaned off bactrim .   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 Dr. Glady [name]. She stopped it for c-scope. She worked for [employer] and [employer]. She volunteers as Architect.   PMH: Past Medical History:  Diagnosis Date   Anxiety 12/29/2022   Arthritis    Asthma    Carpal tunnel  syndrome    Depression    Dyspnea    Dysrhythmia    Gastric ulcer    GERD (gastroesophageal reflux disease)    H/O hiatal hernia    Heart murmur    since birth   History of heart murmur in childhood    History of kidney stones    Hypertension    Iron deficiency anemia    Neuropathy    Following neck surgery.   Paralysis (HCC)    PONV (postoperative nausea and vomiting)    Primary localized osteoarthritis of right knee 02/06/2024   Pulmonary embolus [facility])    November 2020   Type 2 diabetes mellitus [facility])     Surgical History: Past Surgical History:  Procedure Laterality Date   APPENDECTOMY     ATRIAL FIBRILLATION ABLATION N/A 10/09/2023   Procedure: ATRIAL FIBRILLATION ABLATION;  Surgeon: [name];  Location: [facility] INVASIVE CV LAB;  Service: Cardiovascular;  Laterality: N/A;   BIOPSY  06/01/2022   Procedure: BIOPSY;  Surgeon: [name];  [facility];  Service: Endoscopy;;   CHOLECYSTECTOMY     COLONOSCOPY     COLONOSCOPY N/A 09/26/2013   Procedure: COLONOSCOPY;  Surgeon: [name];  [facility]: [facility];  Service: Endoscopy;  Laterality: N/A;  1030-rescheduled to 12:00pm Ann notified pt   COLONOSCOPY N/A 03/27/2015   Procedure: COLONOSCOPY;  Surgeon: [name];  [facility]: [facility];  Service: Endoscopy;  Laterality: N/A;  11:15 - moved to 8:25 - Ann to notify pt   COLONOSCOPY WITH PROPOFOL  N/A 06/01/2022   Procedure: COLONOSCOPY WITH PROPOFOL ;  Surgeon: [name];  [facility];  Service: Endoscopy;  Laterality: N/A;  1005 ASA 2   CYSTOSCOPY/URETEROSCOPY/HOLMIUM LASER/STENT PLACEMENT Left 12/23/2022   Procedure: CYSTOSCOPY/ LEFT RETROGRADE/ URETEROSCOPY/HOLMIUM LASER/STENT PLACEMENT;  Surgeon: [name];  Location: [facility] ORS;  Service: Urology;  Laterality: Left;   ESOPHAGOGASTRODUODENOSCOPY  12/08/2011   Procedure: ESOPHAGOGASTRODUODENOSCOPY (EGD);  Surgeon: [name];  Location: [facility];  Service: Endoscopy;  Laterality: N/A;  3:00    ESOPHAGOGASTRODUODENOSCOPY N/A 03/27/2015   Procedure: ESOPHAGOGASTRODUODENOSCOPY (EGD);  Surgeon: [name];  [facility];  Service: Endoscopy;  Laterality: N/A;   EYE SURGERY Bilateral    cataract surgery   GIVENS CAPSULE STUDY N/A 11/04/2015   Procedure: GIVENS CAPSULE STUDY;  Surgeon: [name];  [facility];  Service: Endoscopy;  Laterality: N/A;   Hysterscopy     KNEE ARTHROSCOPY     Left   NECK SURGERY     x 2    TONSILLECTOMY     TOTAL KNEE ARTHROPLASTY  07/11/2012   Procedure: TOTAL KNEE ARTHROPLASTY;  Surgeon: [name];  Location: [facility] OR;  Service: Orthopedics;  Laterality: Left;  left total knee arthroplasty   TOTAL KNEE ARTHROPLASTY Right 02/06/2024   Procedure: TOTAL KNEE ARTHROPLASTY;  Surgeon: [name];  Location: [facility];  Service: Orthopedics;  Laterality: Right;   UPPER GASTROINTESTINAL ENDOSCOPY     YAG LASER APPLICATION Right 02/21/2017   Procedure: YAG LASER APPLICATION;  Surgeon: [name];  Location: [facility];  Service: Ophthalmology;  Laterality: Right;    Home Medications:  Allergies as of 05/13/2024       Reactions   Nsaids Other (See Comments)   Caused Ulcers.   Cephalexin    unknown   Darvocet [propoxyphene N-acetaminophen ] Nausea Only   Percocet [oxycodone -acetaminophen ] Nausea Only   Penicillins Rash, Other (See Comments)   Has patient had a PCN reaction causing immediate rash, facial/tongue/throat swelling, SOB or lightheadedness with hypotension: Yes Has patient had a PCN reaction causing severe rash involving mucus membranes or skin necrosis: No Has patient had a PCN reaction that required hospitalization No Has patient had a PCN reaction occurring within the last 10 years: Yes If all of the above answers are "NO", then may proceed with Cephalosporin use.        Medication List        Accurate as of May 13, 2024 11:23  AM. If you have any questions, ask your nurse or doctor.          albuterol  108 (90 Base) MCG/ACT inhaler Commonly known as: VENTOLIN  HFA Inhale 1-2 puffs into the lungs every 4 (four) hours as needed for wheezing or shortness of breath.   AZO URINARY TRACT SUPPORT PO Take 2 tablets by mouth daily.   baclofen  10 MG tablet Commonly known as: LIORESAL  Take 1 tablet (10 mg total) by mouth 3 (three) times daily. As needed for muscle spasm   cholecalciferol  25 MCG (1000 UNIT) tablet Commonly known as: VITAMIN D3 Take 1,000 Units by mouth daily.   cyanocobalamin 1000 MCG tablet Commonly known as:  VITAMIN B12 Take 1,000 mcg by mouth daily.   diazepam  5 MG tablet Commonly known as: VALIUM  Take 5 mg by mouth every 8 (eight) hours as needed for anxiety.   ferrous sulfate  325 (65 FE) MG tablet Take 1 tablet (325 mg total) by mouth daily with breakfast.   furosemide  20 MG tablet Commonly known as: LASIX  TAKE 1 TABLET BY MOUTH TWICE DAILY.TAKE AN ADDITIONAL TABLET DAILY AS NEEDED FOR SHORTNESS OF BREATH   gabapentin  300 MG capsule Commonly known as: NEURONTIN  Take 300 mg by mouth at bedtime.   Klor-Con  M20 20 MEQ tablet Generic drug: potassium chloride  SA TAKE 1 TABLET BY MOUTH EVERY DAY   Medical Compression Stockings Misc 1 each by Does not apply route as directed. Knew High Medium Pressure Compression stockings Dx: bilateral lower extremity edema   metFORMIN 500 MG tablet Commonly known as: GLUCOPHAGE Take 500 mg by mouth 2 (two) times daily with a meal.   metoprolol  tartrate 25 MG tablet Commonly known as: LOPRESSOR  Take 0.5 tablets (12.5 mg total) by mouth as needed (hr over 100 bpm).   montelukast  10 MG tablet Commonly known as: SINGULAIR  Take 10 mg by mouth every morning.   NON FORMULARY Pt uses a cpap   omeprazole  20 MG capsule Commonly known as: PRILOSEC Take 20 mg by mouth 2 (two) times daily before a meal.   PROBIOTIC DAILY PO Take 1 capsule by mouth  daily.   rivaroxaban  20 MG Tabs tablet Commonly known as: XARELTO  Take 20 mg by mouth daily with supper.   Turmeric 450 MG Caps Take 450 mg by mouth daily.        Allergies:  Allergies  Allergen Reactions   Nsaids Other (See Comments)    Caused Ulcers.   Cephalexin     unknown   Darvocet [Propoxyphene N-Acetaminophen ] Nausea Only   Percocet [Oxycodone -Acetaminophen ] Nausea Only   Penicillins Rash and Other (See Comments)    Has patient had a PCN reaction causing immediate rash, facial/tongue/throat swelling, SOB or lightheadedness with hypotension: Yes Has patient had a PCN reaction causing severe rash involving mucus membranes or skin necrosis: No Has patient had a PCN reaction that required hospitalization No Has patient had a PCN reaction occurring within the last 10 years: Yes If all of the above answers are "NO", then may proceed with Cephalosporin use.     Family History: Family History  Problem Relation Age of Onset   Alzheimer's disease Mother    Kidney disease Mother    Kidney disease Father    Diabetes Maternal Grandmother    Diabetes Maternal Grandfather    Cancer Paternal Grandmother        breast, liver   Hypertension Son    Cancer Maternal Aunt        leukemia   Liver disease Maternal Uncle    Kidney disease Paternal Uncle        kidney removed   Colon cancer Neg Hx     Social History:  reports that she has never smoked. She has never been exposed to tobacco smoke. She has never used smokeless tobacco. She reports that she does not drink alcohol and does not use drugs.   Physical Exam: There were no vitals taken for this visit.  Constitutional:  Alert and oriented, No acute distress. HEENT: [place] AT, moist mucus membranes.  Trachea midline, no masses. Cardiovascular: No clubbing, cyanosis, or edema. Respiratory: Normal respiratory effort, no increased work of breathing. GI: Abdomen is soft, nontender, nondistended,  no abdominal masses GU: No CVA  tenderness Lymph: No cervical or inguinal lymphadenopathy. Skin: No rashes, bruises or suspicious lesions. Neurologic: Grossly intact, no focal deficits, moving all 4 extremities. Psychiatric: Normal mood and affect.  Laboratory Data: Lab Results  Component Value Date   WBC 10.6 (H) 02/08/2024   HGB 11.8 (L) 02/08/2024   HCT 36.3 02/08/2024   MCV 92.6 02/08/2024   PLT 245 02/08/2024    Lab Results  Component Value Date   CREATININE 0.99 02/08/2024    No results found for: "PSA"  No results found for: "TESTOSTERONE"  Lab Results  Component Value Date   HGBA1C 6.3 (H) 01/25/2024    Urinalysis    Component Value Date/Time   COLORURINE YELLOW 03/13/2023 1950   APPEARANCEUR CLEAR 03/13/2023 1950   APPEARANCEUR Clear 03/13/2023 0915   LABSPEC 1.016 03/13/2023 1950   PHURINE 5.0 03/13/2023 1950   GLUCOSEU NEGATIVE 03/13/2023 1950   HGBUR NEGATIVE 03/13/2023 1950   BILIRUBINUR NEGATIVE 03/13/2023 1950   BILIRUBINUR Negative 03/13/2023 0915   KETONESUR NEGATIVE 03/13/2023 1950   PROTEINUR NEGATIVE 03/13/2023 1950   UROBILINOGEN 0.2 10/15/2022 0836   UROBILINOGEN 0.2 05/21/2013 2133   NITRITE NEGATIVE 03/13/2023 1950   LEUKOCYTESUR NEGATIVE 03/13/2023 1950    Lab Results  Component Value Date   LABMICR Comment 03/13/2023   WBCUA >30 (A) 02/02/2023   LABEPIT 0-10 02/02/2023   BACTERIA Many (A) 02/02/2023    Pertinent Imaging:  Results for orders placed during the hospital encounter of 02/07/23  DG Abd 1 View  Narrative CLINICAL DATA:  History of nephrolithiasis. Status post lithotripsy. Recent UTI.  EXAM: ABDOMEN - 1 VIEW  COMPARISON:  CT abdomen pelvis with contrast 12/28/2022  FINDINGS: No dilated loops of bowel to indicate ileus or obstruction. Cholecystectomy clips are seen in the right upper quadrant. There are no radiopaque renal calculi. Pelvic calcifications consistent with phleboliths.  IMPRESSION: No radiopaque renal  calculi.   Electronically Signed By: Elester Grim M.D. On: 02/08/2023 09:11  Results for orders placed during the hospital encounter of 11/01/17  US  Venous Img Lower Bilateral  Narrative CLINICAL DATA:  78 year old female with bilateral lower extremity edema  EXAM: BILATERAL LOWER EXTREMITY VENOUS DOPPLER ULTRASOUND  TECHNIQUE: Gray-scale sonography with graded compression, as well as color Doppler and duplex ultrasound were performed to evaluate the lower extremity deep venous systems from the level of the common femoral vein and including the common femoral, femoral, profunda femoral, popliteal and calf veins including the posterior tibial, peroneal and gastrocnemius veins when visible. The superficial great saphenous vein was also interrogated. Spectral Doppler was utilized to evaluate flow at rest and with distal augmentation maneuvers in the common femoral, femoral and popliteal veins.  COMPARISON:  None.  FINDINGS: RIGHT LOWER EXTREMITY  Common Femoral Vein: No evidence of thrombus. Normal compressibility, respiratory phasicity and response to augmentation.  Saphenofemoral Junction: No evidence of thrombus. Normal compressibility and flow on color Doppler imaging.  Profunda Femoral Vein: No evidence of thrombus. Normal compressibility and flow on color Doppler imaging.  Femoral Vein: No evidence of thrombus. Normal compressibility, respiratory phasicity and response to augmentation.  Popliteal Vein: No evidence of thrombus. Normal compressibility, respiratory phasicity and response to augmentation.  Calf Veins: No evidence of thrombus. Normal compressibility and flow on color Doppler imaging.  Superficial Great Saphenous Vein: No evidence of thrombus. Normal compressibility.  Venous Reflux:  None.  Other Findings:  None.  LEFT LOWER EXTREMITY  Common Femoral Vein: No evidence of thrombus.  Normal compressibility, respiratory phasicity and response to  augmentation.  Saphenofemoral Junction: No evidence of thrombus. Normal compressibility and flow on color Doppler imaging.  Profunda Femoral Vein: No evidence of thrombus. Normal compressibility and flow on color Doppler imaging.  Femoral Vein: No evidence of thrombus. Normal compressibility, respiratory phasicity and response to augmentation.  Popliteal Vein: No evidence of thrombus. Normal compressibility, respiratory phasicity and response to augmentation.  Calf Veins: No evidence of thrombus. Normal compressibility and flow on color Doppler imaging.  Superficial Great Saphenous Vein: No evidence of thrombus. Normal compressibility.  Venous Reflux:  None.  Other Findings:  None.  IMPRESSION: No evidence of deep venous thrombosis.   Electronically Signed By: Fernando Hoyer M.D. On: 11/02/2017 13:20  No results found for this or any previous visit.  No results found for this or any previous visit.  Results for orders placed during the hospital encounter of 02/23/24  US  RENAL  Narrative CLINICAL DATA:  Nephrolithiasis.  EXAM: RENAL / URINARY TRACT ULTRASOUND COMPLETE  COMPARISON:  Renal ultrasound 03/27/2023  FINDINGS: Right Kidney:  Renal measurements: 11.3 x 4.5 x 6.3 cm = volume: 167.1 mL. Normal renal cortical thickness and echogenicity. No hydronephrosis. 5.9 cm partially exophytic superior pole.  Left Kidney:  Renal measurements: 11.3 x 4.8 x 5.5 cm = volume: 153.1 mL. Normal renal cortical thickness and echogenicity. No hydronephrosis. 5 mm stone.  Bladder:  Appears normal for degree of bladder distention.  Other:  None.  IMPRESSION: 1. No hydronephrosis. 2. 5.9 cm partially exophytic superior pole right renal cyst. 3. 5 mm left renal stone.   Electronically Signed By: Jone Neither M.D. On: 03/05/2024 18:57  No results found for this or any previous visit.  No results found for this or any previous visit.  Results for orders  placed in visit on 12/01/22  CT RENAL STONE STUDY  Narrative CLINICAL DATA:  Left flank pain for 3 months. Hematuria. Nephrolithiasis.  EXAM: CT ABDOMEN AND PELVIS WITHOUT CONTRAST  TECHNIQUE: Multidetector CT imaging of the abdomen and pelvis was performed following the standard protocol without IV contrast.  RADIATION DOSE REDUCTION: This exam was performed according to the departmental dose-optimization program which includes automated exposure control, adjustment of the mA and/or kV according to patient size and/or use of iterative reconstruction technique.  COMPARISON:  03/05/2021  FINDINGS: Lower chest: No acute findings.  Hepatobiliary: Moderate diffuse hepatic steatosis is again demonstrated. Prior cholecystectomy. No evidence of biliary obstruction.  Pancreas: No mass or inflammatory process visualized on this unenhanced exam.  Spleen:  Within normal limits in size.  Adrenals/Urinary tract: 1.7 cm calculus is seen in the left renal pelvis causing mild left hydronephrosis. Unremarkable unopacified urinary bladder.  Stomach/Bowel: Large hiatal hernia is again seen containing the entire stomach. No evidence of obstruction, inflammatory process, or abnormal fluid collections.  Vascular/Lymphatic: No pathologically enlarged lymph nodes identified. No evidence of abdominal aortic aneurysm.  Reproductive:  No mass or other significant abnormality.  Other:  None.  Musculoskeletal:  No suspicious bone lesions identified.  IMPRESSION: 1.7 cm calculus in left renal pelvis causing mild left hydronephrosis.  Large hiatal hernia, which contains the entire stomach.  Moderate hepatic steatosis.   Electronically Signed By: Marlyce Sine M.D. On: 12/16/2022 08:02   Assessment & Plan:    1) renal cyst, kidney stone - reviewed imaging. Not convinced it's a stone - no shadowing. No hydro. No stone on KUB last year. F/u US  in 1 year.  2) UUI - disc  meds, PT and  BT. She will continue surveillance and cut back on caffeine.   No follow-ups on file.  [name]  [facility]  [address] [place], [place] [address] [phone]

## 2024-05-27 ENCOUNTER — Other Ambulatory Visit: Payer: Self-pay

## 2024-05-27 ENCOUNTER — Encounter (HOSPITAL_COMMUNITY): Payer: Self-pay | Admitting: *Deleted

## 2024-05-27 ENCOUNTER — Observation Stay (HOSPITAL_COMMUNITY)
Admission: EM | Admit: 2024-05-27 | Discharge: 2024-05-30 | Disposition: A | Discharge status: Home / Self Care | Attending: Family Medicine | Admitting: Family Medicine

## 2024-05-27 ENCOUNTER — Emergency Department (HOSPITAL_COMMUNITY)

## 2024-05-27 DIAGNOSIS — E1140 Type 2 diabetes mellitus with diabetic neuropathy, unspecified: Secondary | ICD-10-CM | POA: Diagnosis not present

## 2024-05-27 DIAGNOSIS — Z981 Arthrodesis status: Secondary | ICD-10-CM | POA: Diagnosis not present

## 2024-05-27 DIAGNOSIS — I340 Nonrheumatic mitral (valve) insufficiency: Secondary | ICD-10-CM | POA: Insufficient documentation

## 2024-05-27 DIAGNOSIS — Z7982 Long term (current) use of aspirin: Secondary | ICD-10-CM | POA: Insufficient documentation

## 2024-05-27 DIAGNOSIS — I5032 Chronic diastolic (congestive) heart failure: Secondary | ICD-10-CM | POA: Diagnosis not present

## 2024-05-27 DIAGNOSIS — G629 Polyneuropathy, unspecified: Secondary | ICD-10-CM

## 2024-05-27 DIAGNOSIS — J45909 Unspecified asthma, uncomplicated: Secondary | ICD-10-CM | POA: Diagnosis not present

## 2024-05-27 DIAGNOSIS — R079 Chest pain, unspecified: Secondary | ICD-10-CM | POA: Diagnosis not present

## 2024-05-27 DIAGNOSIS — E66811 Obesity, class 1: Secondary | ICD-10-CM | POA: Diagnosis not present

## 2024-05-27 DIAGNOSIS — R0789 Other chest pain: Secondary | ICD-10-CM | POA: Diagnosis not present

## 2024-05-27 DIAGNOSIS — I4892 Unspecified atrial flutter: Secondary | ICD-10-CM | POA: Diagnosis not present

## 2024-05-27 DIAGNOSIS — R9439 Abnormal result of other cardiovascular function study: Secondary | ICD-10-CM | POA: Insufficient documentation

## 2024-05-27 DIAGNOSIS — Z7901 Long term (current) use of anticoagulants: Secondary | ICD-10-CM | POA: Insufficient documentation

## 2024-05-27 DIAGNOSIS — E1165 Type 2 diabetes mellitus with hyperglycemia: Secondary | ICD-10-CM | POA: Diagnosis not present

## 2024-05-27 DIAGNOSIS — I4819 Other persistent atrial fibrillation: Secondary | ICD-10-CM | POA: Insufficient documentation

## 2024-05-27 DIAGNOSIS — I110 Hypertensive heart disease with heart failure: Secondary | ICD-10-CM | POA: Insufficient documentation

## 2024-05-27 DIAGNOSIS — K219 Gastro-esophageal reflux disease without esophagitis: Secondary | ICD-10-CM | POA: Diagnosis not present

## 2024-05-27 DIAGNOSIS — Z6834 Body mass index (BMI) 34.0-34.9, adult: Secondary | ICD-10-CM | POA: Insufficient documentation

## 2024-05-27 DIAGNOSIS — K449 Diaphragmatic hernia without obstruction or gangrene: Secondary | ICD-10-CM | POA: Diagnosis not present

## 2024-05-27 LAB — BASIC METABOLIC PANEL WITH GFR
Anion gap: 10 (ref 5–15)
BUN: 22 mg/dL (ref 8–23)
CO2: 26 mmol/L (ref 22–32)
Calcium: 9.5 mg/dL (ref 8.9–10.3)
Chloride: 101 mmol/L (ref 98–111)
Creatinine, Ser: 0.94 mg/dL (ref 0.44–1.00)
GFR, Estimated: >60 mL/min (ref >60)
Glucose, Bld: 159 mg/dL — ABNORMAL HIGH (ref 70–99)
Potassium: 3.9 mmol/L (ref 3.5–5.1)
Sodium: 137 mmol/L (ref 135–145)

## 2024-05-27 LAB — CBC
HCT: 43 % (ref 36.0–46.0)
Hemoglobin: 14.1 g/dL (ref 12.0–15.0)
MCH: 29.8 pg (ref 26.0–34.0)
MCHC: 32.8 g/dL (ref 30.0–36.0)
MCV: 90.9 fL (ref 80.0–100.0)
Platelets: 274 10*3/uL (ref 150–400)
RBC: 4.73 MIL/uL (ref 3.87–5.11)
RDW: 15.1 % (ref 11.5–15.5)
WBC: 8.8 10*3/uL (ref 4.0–10.5)
nRBC: 0 % (ref 0.0–0.2)

## 2024-05-27 LAB — TROPONIN I (HIGH SENSITIVITY)
Troponin I (High Sensitivity): 3 ng/L (ref <18)
Troponin I (High Sensitivity): 3 ng/L (ref <18)

## 2024-05-27 MED ORDER — NITROGLYCERIN 0.4 MG SL SUBL
0.4000 mg | SUBLINGUAL_TABLET | SUBLINGUAL | Status: DC | PRN
  Administered 2024-05-27 (×3): 0.4 mg via SUBLINGUAL
  Filled 2024-05-27: qty 1

## 2024-05-27 MED ORDER — ASPIRIN 81 MG PO CHEW
324.0000 mg | CHEWABLE_TABLET | Freq: Once | ORAL | Status: AC
  Administered 2024-05-27: 324 mg via ORAL
  Filled 2024-05-27: qty 4

## 2024-05-27 NOTE — Progress Notes (Signed)
 OVERNIGHT ON CALL CARDIOLOGY NOTE  Received a call from [facility] regarding Ms. Ferg and her chest pain that resolved with nitroglycerin . Cheat pain appears typical for ischemia, however troponin was negative.  Chart Review:  09/2023 Cardiac CT had a CAC score of 0 10/2017 Nuclear medicine stress test with anteroseptal reversible defect (2/2 breast attenuation) and fixed apical inferolateral defect (consistent with soft tissue attenuation) 05/2023 ECHO Normal EF with G1DD and mild LVH  There is potential for soft plaque and/or ANOCA. Would recommend ECHO tomorrow for evaluation of WMA. I also discussed the importance of a NM stress test to compare the results to her prior test done in 2018, however team is uncertain if stress test can be completed at institution. If above is normal, would recommend follow up with cardiologist as outpatient.     I did not see the patient. These recommendations were given based on chart review and discussion with care team  Velvet Gibbs, MD MS  Overnight [facility] [place]

## 2024-05-27 NOTE — H&P (Signed)
 History and Physical    Patient: [name] WUJ:[id number] DOB: 05/25/1946 DOA: 05/27/2024 DOS: the patient was seen and examined on 05/28/2024 PCP: [name], MD  Patient coming from: Home  Chief Complaint:  Chief Complaint  Patient presents with   Chest Pain   HPI: [name] is a 78 y.o. female with medical history significant of type 2 diabetes mellitus, GERD, neuropathy, anxiety, atrial fibrillation s/p ablation (09/2023) who presents to the emergency department due to sudden onset of left-sided chest pain which started about 1 hour prior to arrival.  Chest pain occurred while having dinner and the pain radiates to the neck and presented with an associated nausea and vomiting.  She denies shortness of breath, lightheadedness or dizziness.  ED Course:  In the emergency department, she was hemodynamically stable.  Workup in the ED showed normal CBC and BMP except for blood glucose of 159, troponin times was flat at 3. Chest x-ray showed no acute cardiopulmonary abnormality Aspirin  325 mg x 1 was given, sublingual nitroglycerin  was given and chest pain resolved. Cardiologist on-call (Dr. Sanjuana Crutch, [place]) was consulted and recommended echocardiogram in the morning and also suggested nuclear medicine stress test to compare the results to a prior test done in 2018.   [facility] was asked to admit patient for further evaluation and management.  10/2017  Nuclear medicine stress test with anteroseptal reversible defect (2/2 breast attenuation) and fixed apical inferolateral defect (consistent with soft tissue attenuation) 05/2023  ECHO Normal EF with G1DD and mild LVH 09/2023  Cardiac CT had a CAC score of 0    Review of Systems: Review of systems as noted in the HPI. All other systems reviewed and are negative.   Past Medical History:  Diagnosis Date   Anxiety 12/29/2022   Arthritis    Asthma    Carpal tunnel syndrome    Depression    Dyspnea    Dysrhythmia    Gastric ulcer     GERD (gastroesophageal reflux disease)    H/O hiatal hernia    Heart murmur    since birth   History of heart murmur in childhood    History of kidney stones    Hypertension    Iron deficiency anemia    Neuropathy    Following neck surgery.   Paralysis (HCC)    PONV (postoperative nausea and vomiting)    Primary localized osteoarthritis of right knee 02/06/2024   Pulmonary embolus [facility])    November 2020   Type 2 diabetes mellitus [facility])    Past Surgical History:  Procedure Laterality Date   APPENDECTOMY     ATRIAL FIBRILLATION ABLATION N/A 10/09/2023   Procedure: ATRIAL FIBRILLATION ABLATION;  Surgeon: Ardeen Kohler, MD;  Location: [facility] INVASIVE CV LAB;  Service: Cardiovascular;  Laterality: N/A;   BIOPSY  06/01/2022   Procedure: BIOPSY;  Surgeon: Ruby Corporal, MD;  Location: [facility];  Service: Endoscopy;;   CHOLECYSTECTOMY     COLONOSCOPY     COLONOSCOPY N/A 09/26/2013   Procedure: COLONOSCOPY;  Surgeon: Ruby Corporal, MD;  Location: [facility];  Service: Endoscopy;  Laterality: N/A;  1030-rescheduled to 12:00pm Ann notified pt   COLONOSCOPY N/A 03/27/2015   Procedure: COLONOSCOPY;  Surgeon: Ruby Corporal, MD;  Location: [facility];  Service: Endoscopy;  Laterality: N/A;  11:15 - moved to 8:25 - Ann to notify pt   COLONOSCOPY WITH PROPOFOL  N/A 06/01/2022   Procedure: COLONOSCOPY WITH PROPOFOL ;  Surgeon: Ruby Corporal, MD;  Location: [facility];  Service: Endoscopy;  Laterality: N/A;  1005 ASA 2   CYSTOSCOPY/URETEROSCOPY/HOLMIUM LASER/STENT PLACEMENT Left 12/23/2022   Procedure: CYSTOSCOPY/ LEFT RETROGRADE/ URETEROSCOPY/HOLMIUM LASER/STENT PLACEMENT;  Surgeon: [name], MD;  Location: [facility];  Service: Urology;  Laterality: Left;   ESOPHAGOGASTRODUODENOSCOPY  12/08/2011   Procedure: ESOPHAGOGASTRODUODENOSCOPY (EGD);  Surgeon: Ruby Corporal, MD;  Location: [facility];  Service: Endoscopy;  Laterality: N/A;  3:00    ESOPHAGOGASTRODUODENOSCOPY N/A  03/27/2015   Procedure: ESOPHAGOGASTRODUODENOSCOPY (EGD);  Surgeon: Ruby Corporal, MD;  [facility]: [facility];  Service: Endoscopy;  Laterality: N/A;   EYE SURGERY Bilateral    cataract surgery   GIVENS CAPSULE STUDY N/A 11/04/2015   Procedure: GIVENS CAPSULE STUDY;  Surgeon: Ruby Corporal, MD;  Location: [facility];  Service: Endoscopy;  Laterality: N/A;   Hysterscopy     KNEE ARTHROSCOPY     Left   NECK SURGERY     x 2    TONSILLECTOMY     TOTAL KNEE ARTHROPLASTY  07/11/2012   Procedure: TOTAL KNEE ARTHROPLASTY;  Surgeon: [name], MD;  Location[facility] OR;  Service: Orthopedics;  Laterality: Left;  left total knee arthroplasty   TOTAL KNEE ARTHROPLASTY Right 02/06/2024   Procedure: TOTAL KNEE ARTHROPLASTY;  Surgeon: [name], MD;  Location: [facility];  Service: Orthopedics;  Laterality: Right;   UPPER GASTROINTESTINAL ENDOSCOPY     YAG LASER APPLICATION Right 02/21/2017   Procedure: YAG LASER APPLICATION;  Surgeon: [name], MD;  Location: [facility];  Service: Ophthalmology;  Laterality: Right;    Social History:  reports that she has never smoked. She has never been exposed to tobacco smoke. She has never used smokeless tobacco. She reports that she does not drink alcohol and does not use drugs.   Allergies  Allergen Reactions   Nsaids Other (See Comments)    Caused Ulcers.   Cephalexin     unknown   Darvocet [Propoxyphene N-Acetaminophen ] Nausea Only   Percocet [Oxycodone -Acetaminophen ] Nausea Only   Penicillins Rash and Other (See Comments)    Has patient had a PCN reaction causing immediate rash, facial/tongue/throat swelling, SOB or lightheadedness with hypotension: Yes Has patient had a PCN reaction causing severe rash involving mucus membranes or skin necrosis: No Has patient had a PCN reaction that required hospitalization No Has patient had a PCN reaction occurring within the last 10 years: Yes If all of the above answers are "NO", then may proceed with  Cephalosporin use.     Family History  Problem Relation Age of Onset   Alzheimer's disease Mother    Kidney disease Mother    Kidney disease Father    Diabetes Maternal Grandmother    Diabetes Maternal Grandfather    Cancer Paternal Grandmother        breast, liver   Hypertension Son    Cancer Maternal Aunt        leukemia   Liver disease Maternal Uncle    Kidney disease Paternal Uncle        kidney removed   Colon cancer Neg Hx      Prior to Admission medications   Medication Sig Start Date End Date Taking? Authorizing Provider  albuterol  (VENTOLIN  HFA) 108 (90 Base) MCG/ACT inhaler Inhale 1-2 puffs into the lungs every 4 (four) hours as needed for wheezing or shortness of breath. 09/05/23   [provider]  cholecalciferol  (VITAMIN D3) 25 MCG (1000 UNIT) tablet Take 1,000 Units by mouth daily.  [provider]  cyanocobalamin (VITAMIN B12) 1000 MCG tablet Take 1,000 mcg by mouth daily.    [provider]  diazepam  (VALIUM ) 5 MG tablet Take 5 mg by mouth every 8 (eight) hours as needed for anxiety. Patient not taking: Reported on 05/13/2024    [provider]  Elastic Bandages & Supports (MEDICAL COMPRESSION STOCKINGS) MISC 1 each by Does not apply route as directed. Knew High Medium Pressure Compression stockings Dx: bilateral lower extremity edema Patient not taking: Reported on 05/13/2024 10/18/23   Mallipeddi, Vishnu P, MD  escitalopram (LEXAPRO) 10 MG tablet Take 10 mg by mouth daily. 03/25/24   [provider]  ferrous sulfate  325 (65 FE) MG tablet Take 1 tablet (325 mg total) by mouth daily with breakfast. 10/25/16   Rehman, Mathews Solomons, MD  furosemide  (LASIX ) 20 MG tablet TAKE 1 TABLET BY MOUTH TWICE DAILY.TAKE AN ADDITIONAL TABLET DAILY AS NEEDED FOR SHORTNESS OF BREATH 01/08/24   Mallipeddi, Vishnu P, MD  gabapentin  (NEURONTIN ) 300 MG capsule Take 300 mg by mouth at bedtime.    [provider]  KLOR-CON  M20 20 MEQ tablet  TAKE 1 TABLET BY MOUTH EVERY DAY 05/02/24   Mallipeddi, Vishnu P, MD  metFORMIN (GLUCOPHAGE) 500 MG tablet Take 500 mg by mouth 2 (two) times daily with a meal.     [provider]  metoprolol  tartrate (LOPRESSOR ) 25 MG tablet Take 0.5 tablets (12.5 mg total) by mouth as needed (hr over 100 bpm). 03/07/24 03/02/25  Mallipeddi, Vishnu P, MD  montelukast  (SINGULAIR ) 10 MG tablet Take 10 mg by mouth every morning.    [provider]  NON FORMULARY Pt uses a cpap    [provider]  omeprazole  (PRILOSEC) 20 MG capsule Take 20 mg by mouth 2 (two) times daily before a meal.    [provider]  Phenazopyrid-Cranbry-C-Probiot (AZO URINARY TRACT SUPPORT PO) Take 2 tablets by mouth daily.    [provider]  Probiotic Product (PROBIOTIC DAILY PO) Take 1 capsule by mouth daily.    [provider]  rivaroxaban  (XARELTO ) 20 MG TABS tablet Take 20 mg by mouth daily with supper.    [provider]  Turmeric 450 MG CAPS Take 450 mg by mouth daily.    [provider]    Physical Exam: BP (!) 137/53 (BP Location: Right Arm)   Pulse 72   Temp 98.2 F (36.8 C) (Oral)   Resp 16   Ht 5\' 4"  (1.626 m)   Wt 80 kg   SpO2 96%   BMI 30.27 kg/m   General: 78 y.o. year-old female well developed well nourished in no acute distress.  Alert and oriented x3. HEENT: NCAT, EOMI Neck: Supple, trachea medial Cardiovascular: Regular rate and rhythm with no rubs or gallops.  No thyromegaly or JVD noted.  No lower extremity edema. 2/4 pulses in all 4 extremities. Respiratory: Clear to auscultation with no wheezes or rales. Good inspiratory effort. Abdomen: Soft, nontender nondistended with normal bowel sounds x4 quadrants. Muskuloskeletal: No cyanosis, clubbing or edema noted bilaterally Neuro: CN II-XII intact, strength 5/5 x 4, sensation, reflexes intact Skin: No ulcerative lesions noted or rashes Psychiatry: Judgement and insight appear normal. Mood is  appropriate for condition and setting          Labs on Admission:  Basic Metabolic Panel: Recent Labs  Lab 05/27/24 1854  NA 137  K 3.9  CL 101  CO2 26  GLUCOSE 159*  BUN 22  CREATININE  0.94  CALCIUM 9.5   Liver Function Tests: No results for input(s): "AST", "ALT", "ALKPHOS", "BILITOT", "PROT", "ALBUMIN" in the last 168 hours. No results for input(s): "LIPASE", "AMYLASE" in the last 168 hours. No results for input(s): "AMMONIA" in the last 168 hours. CBC: Recent Labs  Lab 05/27/24 1854 05/28/24 0402  WBC 8.8 7.4  HGB 14.1 12.6  HCT 43.0 38.2  MCV 90.9 89.0  PLT 274 240   Cardiac Enzymes: No results for input(s): "CKTOTAL", "CKMB", "CKMBINDEX", "TROPONINI" in the last 168 hours.  BNP (last 3 results) Recent Labs    08/31/23 1551  BNP 35.8    ProBNP (last 3 results) No results for input(s): "PROBNP" in the last 8760 hours.  CBG: Recent Labs  Lab 05/28/24 0345  GLUCAP 117*    Radiological Exams on Admission: DG Chest 2 View Result Date: 05/27/2024 CLINICAL DATA:  cp EXAM: CHEST - 2 VIEW COMPARISON:  None available. FINDINGS: No focal airspace consolidation, pleural effusion, or pneumothorax. No cardiomegaly. Large hiatal hernia containing an air-fluid level. No acute fracture or destructive lesion. Cervical fusion hardware again noted. Cholecystectomy clips. Multilevel thoracic osteophytosis. IMPRESSION: No acute cardiopulmonary abnormality. Electronically Signed   By: Rance Burrows M.D.   On: 05/27/2024 19:18    EKG: I independently viewed the EKG done and my findings are as followed: Normal sinus rhythm at rate of 82 bpm with borderline prolonged PR interval.  Assessment/Plan Present on Admission:  Chest pain  GERD (gastroesophageal reflux disease)  Principal Problem:   Chest pain Active Problems:   Neuropathy   GERD (gastroesophageal reflux disease)   Type 2 diabetes mellitus with hyperglycemia (HCC)   Persistent atrial fibrillation (HCC)    Obesity, Class I, BMI 30-34.9  Chest pain Continue telemetry  Troponins x2 - 3 > 3 EKG showed normal sinus rhythm at a rate of 82 bpm with borderline prolonged PR interval Cardiologist on call was consulted and recommended echocardiogram and NM stress test Cardiologist will be consulted for follow-up in the morning and we shall await further recommendations Continue aspirin , nitroglycerin   Type 2 diabetes mellitus with hyperglycemia Hemoglobin A1c on 01/25/2024 was 6.3 Continue ISS and hypoglycemia protocol  Persistent atrial fibrillation EKG personally reviewed showed normal sinus rhythm with rate of 82 bpm with borderline prolonged PR interval Continue Xarelto  Lopressor  will be temporarily held pending cardiology evaluation for possible stress test   GERD (gastroesophageal reflux disease) Continue PPI   Neuropathy Continue gabapentin   Obesity class I (BMI 30.27) Diet and lifestyle modification  DVT prophylaxis: Xarelto   Code Status: Full code  Family Communication: None at bedside  Consults: Cardiology  Severity of Illness: The appropriate patient status for this patient is OBSERVATION. Observation status is judged to be reasonable and necessary in order to provide the required intensity of service to ensure the patient's safety. The patient's presenting symptoms, physical exam findings, and initial radiographic and laboratory data in the context of their medical condition is felt to place them at decreased risk for further clinical deterioration. Furthermore, it is anticipated that the patient will be medically stable for discharge from the hospital within 2 midnights of admission.   Author: Edna Grover, DO 05/28/2024 7:09 AM  For on call review [email or web address].

## 2024-05-27 NOTE — ED Triage Notes (Signed)
 Pt with left sided CP that started about 20 minutes ago while eating dinner. Pt states felt like her heart was racing. + nausea, denies at present. Hx of afib, recent ablation

## 2024-05-27 NOTE — ED Provider Notes (Signed)
 [place] EMERGENCY DEPARTMENT AT [facility] Provider Note   CSN: [id number] Arrival date & time: 05/27/24  1811     History  Chief Complaint  Patient presents with   Chest Pain    [name] is a 78 y.o. female.  Patient is a 78 year old female who presents to the emergency department the chief complaint of onset of left-sided chest pain approximate 1 hour prior to arrival.  Patient notes that she was eating dinner when the pain suddenly started.  She notes that the pain did radiate into her neck and she did have associated nausea and vomiting.  Patient notes that there was been no associated diarrhea.  She denies any associated shortness of breath.  She is currently on Xarelto  and is compliant with this medication secondary to A-fib.  She notes that she did have an ablation for A-fib back in October of last year.  Patient notes that there is been no dizziness, lightheadedness or syncope.  She denies any increased lower extremity edema.   Chest Pain      Home Medications Prior to Admission medications   Medication Sig Start Date End Date Taking? Authorizing Provider  albuterol  (VENTOLIN  HFA) 108 (90 Base) MCG/ACT inhaler Inhale 1-2 puffs into the lungs every 4 (four) hours as needed for wheezing or shortness of breath. 09/05/23   [provider]  cholecalciferol  (VITAMIN D3) 25 MCG (1000 UNIT) tablet Take 1,000 Units by mouth daily.    [provider]  cyanocobalamin (VITAMIN B12) 1000 MCG tablet Take 1,000 mcg by mouth daily.    [provider]  diazepam  (VALIUM ) 5 MG tablet Take 5 mg by mouth every 8 (eight) hours as needed for anxiety. Patient not taking: Reported on 05/13/2024    [provider]  Elastic Bandages & Supports (MEDICAL COMPRESSION STOCKINGS) MISC 1 each by Does not apply route as directed. Knew High Medium Pressure Compression stockings Dx: bilateral lower extremity edema Patient not taking: Reported on 05/13/2024  10/18/23   Mallipeddi, Vishnu P, MD  escitalopram (LEXAPRO) 10 MG tablet Take 10 mg by mouth daily. 03/25/24   [provider]  ferrous sulfate  325 (65 FE) MG tablet Take 1 tablet (325 mg total) by mouth daily with breakfast. 10/25/16   Rehman, Mathews Solomons, MD  furosemide  (LASIX ) 20 MG tablet TAKE 1 TABLET BY MOUTH TWICE DAILY.TAKE AN ADDITIONAL TABLET DAILY AS NEEDED FOR SHORTNESS OF BREATH 01/08/24   Mallipeddi, Vishnu P, MD  gabapentin  (NEURONTIN ) 300 MG capsule Take 300 mg by mouth at bedtime.    [provider]  KLOR-CON  M20 20 MEQ tablet TAKE 1 TABLET BY MOUTH EVERY DAY 05/02/24   Mallipeddi, Vishnu P, MD  metFORMIN (GLUCOPHAGE) 500 MG tablet Take 500 mg by mouth 2 (two) times daily with a meal.     [provider]  metoprolol  tartrate (LOPRESSOR ) 25 MG tablet Take 0.5 tablets (12.5 mg total) by mouth as needed (hr over 100 bpm). 03/07/24 03/02/25  Mallipeddi, Vishnu P, MD  montelukast  (SINGULAIR ) 10 MG tablet Take 10 mg by mouth every morning.    [provider]  NON FORMULARY Pt uses a cpap    [provider]  omeprazole  (PRILOSEC) 20 MG capsule Take 20 mg by mouth 2 (two) times daily before a meal.    [provider]  Phenazopyrid-Cranbry-C-Probiot (AZO URINARY TRACT SUPPORT PO) Take 2 tablets by mouth daily.    [provider]  Probiotic Product (PROBIOTIC DAILY PO) Take 1 capsule  by mouth daily.    [provider]  rivaroxaban  (XARELTO ) 20 MG TABS tablet Take 20 mg by mouth daily with supper.    [provider]  Turmeric 450 MG CAPS Take 450 mg by mouth daily.    [provider]      Allergies    Nsaids, Cephalexin, Darvocet [propoxyphene n-acetaminophen ], Percocet [oxycodone -acetaminophen ], and Penicillins    Review of Systems   Review of Systems  Cardiovascular:  Positive for chest pain.    Physical Exam Updated Vital Signs BP 125/60   Pulse 73   Temp 98.1 F (36.7 C) (Oral)   Resp 14   Ht 5'  4" (1.626 m)   Wt 79.4 kg   SpO2 93%   BMI 30.04 kg/m  Physical Exam Vitals and nursing note reviewed.  Constitutional:      Appearance: Normal appearance.  HENT:     Head: Normocephalic and atraumatic.     Nose: Nose normal.     Mouth/Throat:     Mouth: Mucous membranes are moist.  Eyes:     Extraocular Movements: Extraocular movements intact.     Conjunctiva/sclera: Conjunctivae normal.     Pupils: Pupils are equal, round, and reactive to light.  Cardiovascular:     Rate and Rhythm: Normal rate and regular rhythm.     Pulses: Normal pulses.     Heart sounds: Normal heart sounds. Heart sounds not distant.  Pulmonary:     Effort: Pulmonary effort is normal. No tachypnea, accessory muscle usage or respiratory distress.     Breath sounds: Normal breath sounds. No decreased breath sounds, wheezing, rhonchi or rales.  Chest:     Chest wall: Tenderness present. No crepitus.  Abdominal:     General: Abdomen is flat. Bowel sounds are normal.     Palpations: Abdomen is soft. There is no mass.     Tenderness: There is no abdominal tenderness.  Musculoskeletal:        General: Normal range of motion.     Cervical back: Normal range of motion and neck supple.  Skin:    General: Skin is warm and dry.     Findings: No erythema or rash.  Neurological:     General: No focal deficit present.     Mental Status: She is alert and oriented to person, place, and time. Mental status is at baseline.     Cranial Nerves: No cranial nerve deficit.     Motor: No weakness.  Psychiatric:        Mood and Affect: Mood normal.        Behavior: Behavior normal.        Thought Content: Thought content normal.        Judgment: Judgment normal.     ED Results / Procedures / Treatments   Labs (all labs ordered are listed, but only abnormal results are displayed) Labs Reviewed  BASIC METABOLIC PANEL WITH GFR - Abnormal; Notable for the following components:      Result Value   Glucose, Bld 159 (*)     All other components within normal limits  CBC  TROPONIN I (HIGH SENSITIVITY)  TROPONIN I (HIGH SENSITIVITY)    EKG EKG Interpretation Date/Time:  Monday May 27 2024 18:22:22 EDT Ventricular Rate:  99 PR Interval:  232 QRS Duration:  80 QT Interval:  352 QTC Calculation: 451 R Axis:   126  Text Interpretation: Sinus rhythm with 1st degree A-V block Lateral infarct (cited on or before 29-Jan-2024)  Abnormal ECG Confirmed by Shyrl Doyne [phone]) on 05/27/2024 7:47:31 PM  Radiology DG Chest 2 View Result Date: 05/27/2024 CLINICAL DATA:  cp EXAM: CHEST - 2 VIEW COMPARISON:  None available. FINDINGS: No focal airspace consolidation, pleural effusion, or pneumothorax. No cardiomegaly. Large hiatal hernia containing an air-fluid level. No acute fracture or destructive lesion. Cervical fusion hardware again noted. Cholecystectomy clips. Multilevel thoracic osteophytosis. IMPRESSION: No acute cardiopulmonary abnormality. Electronically Signed   By: Rance Burrows M.D.   On: 05/27/2024 19:18    Procedures Procedures    Medications Ordered in ED Medications  nitroGLYCERIN  (NITROSTAT ) SL tablet 0.4 mg (0.4 mg Sublingual Given 05/27/24 2031)  aspirin  chewable tablet 324 mg (324 mg Oral Given 05/27/24 2105)    ED Course/ Medical Decision Making/ A&P                                 Medical Decision Making Amount and/or Complexity of Data Reviewed Labs: ordered. Radiology: ordered.  Risk OTC drugs. Prescription drug management. Decision regarding hospitalization.   This patient presents to the ED for concern of chest pain, this involves an extensive number of treatment options, and is a complaint that carries with it a high risk of complications and morbidity.  The differential diagnosis includes ACS, pulmonary embolus, pericarditis, myocarditis, endocarditis, pneumonia, pneumothorax, hemothorax, aortic aneurysm or dissection, acute CHF   Co morbidities that complicate the patient  evaluation  Atrial fibrillation, CHF   Additional history obtained:  Additional history obtained from family External records from outside source obtained and reviewed including medical records   Lab Tests:  I Ordered, and personally interpreted labs.  The pertinent results include: No leukocytosis, no anemia, normal kidney function, normal electrolytes, negative serial troponins   Imaging Studies ordered:  I ordered imaging studies including chest x-ray I independently visualized and interpreted imaging which showed no acute cardiopulmonary process I agree with the radiologist interpretation   Cardiac Monitoring: / EKG:  The patient was maintained on a cardiac monitor.  I personally viewed and interpreted the cardiac monitored which showed an underlying rhythm of: Normal sinus rhythm, no ST/T wave changes, no ischemic changes, no STEMI   Consultations Obtained:  I requested consultation with the cardiology, Dr. Sanjuana Crutch,  and discussed lab and imaging findings as well as pertinent plan - they recommend: Admission, echo, stress test   Problem List / ED Course / Critical interventions / Medication management  Patient does remain stable at this time.  Chest pain has resolved with nitroglycerin  in the emergency department.  Patient has been compliant with her Xarelto  and have low suspicion for pulmonary embolus at this point.  Low suspicion for indicators of more dissection as well in this patient.  Serial troponins have been negative at this point and EKG had no acute ischemic changes.  Chest x-ray demonstrated no indication for pneumonia, pneumothorax, hemothorax.  Symptoms not positional in nature and do not suspect pericarditis or myocarditis.  Given patient's initial presentation and improvement with nitroglycerin  we did discuss the patient case with cardiology.  They did recommend admission to the hospital service, echo and stress testing.  This plan was discussed with patient who  is in agreement to plan at this time.  Discussed patient case with Dr. Adefeso with hospital service who has accepted for admission at this time. I ordered medication including aspirin , nitroglycerin  for chest pain Reevaluation of the patient after these medicines showed that the patient  resolved I have reviewed the patients home medicines and have made adjustments as needed   Social Determinants of Health:  None   Test / Admission - Considered:  Admission        Final Clinical Impression(s) / ED Diagnoses Final diagnoses:  None    Rx / DC Orders ED Discharge Orders     None         Emmalene Hare 05/27/24 2337    Ninetta Basket, MD 05/31/24 [phone]

## 2024-05-28 ENCOUNTER — Other Ambulatory Visit (HOSPITAL_COMMUNITY): Payer: Self-pay | Admitting: *Deleted

## 2024-05-28 ENCOUNTER — Observation Stay (HOSPITAL_BASED_OUTPATIENT_CLINIC_OR_DEPARTMENT_OTHER)

## 2024-05-28 DIAGNOSIS — E66811 Obesity, class 1: Secondary | ICD-10-CM | POA: Insufficient documentation

## 2024-05-28 DIAGNOSIS — I4892 Unspecified atrial flutter: Secondary | ICD-10-CM

## 2024-05-28 DIAGNOSIS — R079 Chest pain, unspecified: Secondary | ICD-10-CM | POA: Diagnosis not present

## 2024-05-28 DIAGNOSIS — R0789 Other chest pain: Principal | ICD-10-CM

## 2024-05-28 DIAGNOSIS — R072 Precordial pain: Secondary | ICD-10-CM

## 2024-05-28 DIAGNOSIS — I4891 Unspecified atrial fibrillation: Secondary | ICD-10-CM | POA: Diagnosis not present

## 2024-05-28 DIAGNOSIS — E1165 Type 2 diabetes mellitus with hyperglycemia: Secondary | ICD-10-CM | POA: Insufficient documentation

## 2024-05-28 DIAGNOSIS — I4819 Other persistent atrial fibrillation: Secondary | ICD-10-CM | POA: Insufficient documentation

## 2024-05-28 LAB — COMPREHENSIVE METABOLIC PANEL WITH GFR
ALT: 30 U/L (ref 0–44)
AST: 21 U/L (ref 15–41)
Albumin: 3.7 g/dL (ref 3.5–5.0)
Alkaline Phosphatase: 72 U/L (ref 38–126)
Anion gap: 9 (ref 5–15)
BUN: 20 mg/dL (ref 8–23)
CO2: 24 mmol/L (ref 22–32)
Calcium: 9.4 mg/dL (ref 8.9–10.3)
Chloride: 103 mmol/L (ref 98–111)
Creatinine, Ser: 0.73 mg/dL (ref 0.44–1.00)
GFR, Estimated: >60 mL/min (ref >60)
Glucose, Bld: 101 mg/dL — ABNORMAL HIGH (ref 70–99)
Potassium: 3.3 mmol/L — ABNORMAL LOW (ref 3.5–5.1)
Sodium: 136 mmol/L (ref 135–145)
Total Bilirubin: 0.7 mg/dL (ref 0.0–1.2)
Total Protein: 6.1 g/dL — ABNORMAL LOW (ref 6.5–8.1)

## 2024-05-28 LAB — CBC
HCT: 38.2 % (ref 36.0–46.0)
Hemoglobin: 12.6 g/dL (ref 12.0–15.0)
MCH: 29.4 pg (ref 26.0–34.0)
MCHC: 33 g/dL (ref 30.0–36.0)
MCV: 89 fL (ref 80.0–100.0)
Platelets: 240 10*3/uL (ref 150–400)
RBC: 4.29 MIL/uL (ref 3.87–5.11)
RDW: 15.3 % (ref 11.5–15.5)
WBC: 7.4 10*3/uL (ref 4.0–10.5)
nRBC: 0 % (ref 0.0–0.2)

## 2024-05-28 LAB — ECHOCARDIOGRAM COMPLETE
AR max vel: 2.35 cm2
AV Area VTI: 2.08 cm2
AV Area mean vel: 2.25 cm2
AV Mean grad: 8.3 mmHg
AV Peak grad: 14 mmHg
Ao pk vel: 1.87 m/s
Area-P 1/2: 4.1 cm2
Height: 64 in
MV M vel: 4.72 m/s
MV Peak grad: 89.1 mmHg
MV VTI: 2.16 cm2
S' Lateral: 2.5 cm
Weight: 2821.89 [oz_av]

## 2024-05-28 LAB — GLUCOSE, CAPILLARY
Glucose-Capillary: 117 mg/dL — ABNORMAL HIGH (ref 70–99)
Glucose-Capillary: 120 mg/dL — ABNORMAL HIGH (ref 70–99)
Glucose-Capillary: 112 mg/dL — ABNORMAL HIGH (ref 70–99)
Glucose-Capillary: 117 mg/dL — ABNORMAL HIGH (ref 70–99)
Glucose-Capillary: 104 mg/dL — ABNORMAL HIGH (ref 70–99)

## 2024-05-28 LAB — MAGNESIUM: Magnesium: 1.9 mg/dL (ref 1.7–2.4)

## 2024-05-28 LAB — PHOSPHORUS: Phosphorus: 3.6 mg/dL (ref 2.5–4.6)

## 2024-05-28 MED ORDER — GABAPENTIN 300 MG PO CAPS
300.0000 mg | ORAL_CAPSULE | Freq: Every day | ORAL | Status: DC
  Administered 2024-05-28 – 2024-05-29 (×2): 300 mg via ORAL
  Filled 2024-05-28 (×2): qty 1

## 2024-05-28 MED ORDER — INSULIN ASPART 100 UNIT/ML IJ SOLN: 0.0000 [IU] | INTRAMUSCULAR | Status: DC

## 2024-05-28 MED ORDER — POTASSIUM CHLORIDE CRYS ER 20 MEQ PO TBCR
40.0000 meq | EXTENDED_RELEASE_TABLET | Freq: Two times a day (BID) | ORAL | Status: AC
  Administered 2024-05-28 (×2): 40 meq via ORAL
  Filled 2024-05-28 (×2): qty 2

## 2024-05-28 MED ORDER — PANTOPRAZOLE SODIUM 40 MG PO TBEC
40.0000 mg | DELAYED_RELEASE_TABLET | Freq: Every day | ORAL | Status: DC
  Administered 2024-05-28 – 2024-05-30 (×3): 40 mg via ORAL
  Filled 2024-05-28 (×3): qty 1

## 2024-05-28 MED ORDER — ONDANSETRON HCL 4 MG PO TABS: 4.0000 mg | ORAL_TABLET | Freq: Four times a day (QID) | ORAL | Status: DC | PRN

## 2024-05-28 MED ORDER — ASPIRIN 81 MG PO TBEC
81.0000 mg | DELAYED_RELEASE_TABLET | Freq: Every day | ORAL | Status: DC
  Administered 2024-05-28 – 2024-05-30 (×3): 81 mg via ORAL
  Filled 2024-05-28 (×3): qty 1

## 2024-05-28 MED ORDER — RIVAROXABAN 20 MG PO TABS
20.0000 mg | ORAL_TABLET | Freq: Every day | ORAL | Status: DC
  Administered 2024-05-28: 20 mg via ORAL
  Filled 2024-05-28: qty 1

## 2024-05-28 MED ORDER — ONDANSETRON HCL 4 MG/2ML IJ SOLN: 4.0000 mg | Freq: Four times a day (QID) | INTRAMUSCULAR | Status: DC | PRN

## 2024-05-28 NOTE — Plan of Care (Signed)
   Problem: Education: Goal: Knowledge of General Education information will improve Description Including pain rating scale, medication(s)/side effects and non-pharmacologic comfort measures Outcome: Progressing   Problem: Health Behavior/Discharge Planning: Goal: Ability to manage health-related needs will improve Outcome: Progressing

## 2024-05-28 NOTE — Consult Note (Addendum)
 Cardiology Consultation   [name] ID: [name] MRN: [id number]; DOB: 1946-08-16  Admit date: 05/27/2024 Date of Consult: 05/28/2024  PCP:  [name], MD   [facility] Providers Cardiologist:  Lasalle Pointer, MD  Electrophysiologist:  Ardeen Kohler, MD    [name] Profile: [name] is a 78 y.o. female with a hx of HFpEF, paroxysmal atrial flutter (s/p PVI in October 2024), OSA, PE, DM 2, asthma who is being seen 05/28/2024 for the evaluation of chest pain at the request of Dr. Mason Sole.  History of Present Illness: [name] was last seen in heart care OV 02/2024 wit Dr. Mallipeddi for routine follow-up.  At that time, reported elevated HR 1-2 X per week, however also noted history of anxiety and her diazepam  being reduced recently.  EP discontinued her diazepam  since they suspected cause of leg edema. Noted improvement in leg edema after elevating her legs.  MD suspects could be from chronic venous insufficiency.  Denied any DOE, angina, dizziness, syncope.  Recommended metoprolol  12.5 mg twice daily as needed for heart rate more than 100.  Continued on Lasix  20 mg twice daily, Klor-Con  20 MEQ, and Xarelto  20 mg.   Presented to [facility] ED for CP.  K 3.9 now 3.3, CR 0.94, Mg 1.9, TN negative 3 > 3, CBC WNL CXR with no acute cardiopulmonary abnormalities. EKG NSR, HR 99, first-degree AV block PR 232 MS, baseline artifact Repeat EKG: NSR, HR 82, first-degree AV block PR 212 MS ECHO pending  ED treated with ASA 324 me and NTG 0.4 mg x 3.  On-call cardiologist, Dr.Osude was consulted and recommended echo and nuclear medicine stress to compare to prior results.  On interview, reported sudden onset of left-sided CP for 1 hour yesterday, occurred at rest while eating dinner, radiated to the neck, described as throbbing,  some relief with leaning back. Took 1 diazepam  with no relief. CP relieved after 3 NTG at hospital.  Associated with N/V, SOB. Noted SOB with minimal exertion x  months. Also noted palpitations "heart racing" lasting for 1 hr at same time. Not able to measure HR at the time. Has not needed to take Lopressor . No other recent palpitations noted. Denied any diaphoresis, edema, lightheadedness or dizziness. Able to walk around the grocery without any concerns. Reports mainly eating out but healthy diet. Drinks 1 bottle of soda, 4 bottle of water , half-half tea, 1 cappuccino k cup daily.   Denies any tobacco use, etoh, or drug use. Able to take care of herself. Lives at home with husband.    Past Medical History:  Diagnosis Date   Anxiety 12/29/2022   Arthritis    Asthma    Carpal tunnel syndrome    Depression    Dyspnea    Dysrhythmia    Gastric ulcer    GERD (gastroesophageal reflux disease)    H/O hiatal hernia    Heart murmur    since birth   History of heart murmur in childhood    History of kidney stones    Hypertension    Iron deficiency anemia    Neuropathy    Following neck surgery.   Paralysis (HCC)    PONV (postoperative nausea and vomiting)    Primary localized osteoarthritis of right knee 02/06/2024   Pulmonary embolus [facility])    November 2020   Type 2 diabetes mellitus [facility])     Past Surgical History:  Procedure Laterality Date   APPENDECTOMY     ATRIAL FIBRILLATION ABLATION  N/A 10/09/2023   Procedure: ATRIAL FIBRILLATION ABLATION;  Surgeon: Ardeen Kohler, MD;  Location: [facility] INVASIVE CV LAB;  Service: Cardiovascular;  Laterality: N/A;   BIOPSY  06/01/2022   Procedure: BIOPSY;  Surgeon: Ruby Corporal, MD;  Location: [facility];  Service: Endoscopy;;   CHOLECYSTECTOMY     COLONOSCOPY     COLONOSCOPY N/A 09/26/2013   Procedure: COLONOSCOPY;  Surgeon: Ruby Corporal, MD;  Location: [facility];  Service: Endoscopy;  Laterality: N/A;  1030-rescheduled to 12:00pm Ann notified pt   COLONOSCOPY N/A 03/27/2015   Procedure: COLONOSCOPY;  Surgeon: Ruby Corporal, MD;  Location: [facility];  Service: Endoscopy;  Laterality: N/A;   11:15 - moved to 8:25 - Ann to notify pt   COLONOSCOPY WITH PROPOFOL  N/A 06/01/2022   Procedure: COLONOSCOPY WITH PROPOFOL ;  Surgeon: Ruby Corporal, MD;  Location: [facility];  Service: Endoscopy;  Laterality: N/A;  1005 ASA 2   CYSTOSCOPY/URETEROSCOPY/HOLMIUM LASER/STENT PLACEMENT Left 12/23/2022   Procedure: CYSTOSCOPY/ LEFT RETROGRADE/ URETEROSCOPY/HOLMIUM LASER/STENT PLACEMENT;  Surgeon: [name], MD;  Location: [facility] ORS;  Service: Urology;  Laterality: Left;   ESOPHAGOGASTRODUODENOSCOPY  12/08/2011   Procedure: ESOPHAGOGASTRODUODENOSCOPY (EGD);  Surgeon: Ruby Corporal, MD;  Location: [facility];  Service: Endoscopy;  Laterality: N/A;  3:00    ESOPHAGOGASTRODUODENOSCOPY N/A 03/27/2015   Procedure: ESOPHAGOGASTRODUODENOSCOPY (EGD);  Surgeon: Ruby Corporal, MD;  [facility];  Service: Endoscopy;  Laterality: N/A;   EYE SURGERY Bilateral    cataract surgery   GIVENS CAPSULE STUDY N/A 11/04/2015   Procedure: GIVENS CAPSULE STUDY;  Surgeon: Ruby Corporal, MD;  Location[facility];  Service: Endoscopy;  Laterality: N/A;   Hysterscopy     KNEE ARTHROSCOPY     Left   NECK SURGERY     x 2    TONSILLECTOMY     TOTAL KNEE ARTHROPLASTY  07/11/2012   Procedure: TOTAL KNEE ARTHROPLASTY;  Surgeon: [name], MD;  Location[facility] OR;  Service: Orthopedics;  Laterality: Left;  left total knee arthroplasty   TOTAL KNEE ARTHROPLASTY Right 02/06/2024   Procedure: TOTAL KNEE ARTHROPLASTY;  Surgeon: [name], MD;  Location: [facility];  Service: Orthopedics;  Laterality: Right;   UPPER GASTROINTESTINAL ENDOSCOPY     YAG LASER APPLICATION Right 02/21/2017   Procedure: YAG LASER APPLICATION;  Surgeon: [name], MD;  Location: [facility];  Service: Ophthalmology;  Laterality: Right;     Home Medications:  Prior to Admission medications   Medication Sig Start Date End Date Taking? Authorizing Provider  albuterol  (VENTOLIN  HFA) 108 (90 Base) MCG/ACT inhaler Inhale 1-2 puffs  into the lungs every 4 (four) hours as needed for wheezing or shortness of breath. 09/05/23   [provider]  cholecalciferol  (VITAMIN D3) 25 MCG (1000 UNIT) tablet Take 1,000 Units by mouth daily.    [provider]  cyanocobalamin (VITAMIN B12) 1000 MCG tablet Take 1,000 mcg by mouth daily.    [provider]  diazepam  (VALIUM ) 5 MG tablet Take 5 mg by mouth every 8 (eight) hours as needed for anxiety. [name] not taking: Reported on 05/13/2024    [provider]  Elastic Bandages & Supports (MEDICAL COMPRESSION STOCKINGS) MISC 1 each by Does not apply route as directed. Knew High Medium Pressure Compression stockings Dx: bilateral lower extremity edema [name] not taking: Reported on 05/13/2024 10/18/23   Mallipeddi, Vishnu P, MD  escitalopram (LEXAPRO) 10 MG tablet Take 10 mg by mouth daily. 03/25/24  [provider]  ferrous sulfate  325 (65 FE) MG tablet Take 1 tablet (325 mg total) by mouth daily with breakfast. 10/25/16   Rehman, Mathews Solomons, MD  furosemide  (LASIX ) 20 MG tablet TAKE 1 TABLET BY MOUTH TWICE DAILY.TAKE AN ADDITIONAL TABLET DAILY AS NEEDED FOR SHORTNESS OF BREATH 01/08/24   Mallipeddi, Vishnu P, MD  gabapentin  (NEURONTIN ) 300 MG capsule Take 300 mg by mouth at bedtime.    [provider]  KLOR-CON  M20 20 MEQ tablet TAKE 1 TABLET BY MOUTH EVERY DAY 05/02/24   Mallipeddi, Vishnu P, MD  metFORMIN (GLUCOPHAGE) 500 MG tablet Take 500 mg by mouth 2 (two) times daily with a meal.     [provider]  metoprolol  tartrate (LOPRESSOR ) 25 MG tablet Take 0.5 tablets (12.5 mg total) by mouth as needed (hr over 100 bpm). 03/07/24 03/02/25  Mallipeddi, Vishnu P, MD  montelukast  (SINGULAIR ) 10 MG tablet Take 10 mg by mouth every morning.    [provider]  NON FORMULARY Pt uses a cpap    [provider]  omeprazole  (PRILOSEC) 20 MG capsule Take 20 mg by mouth 2 (two) times daily before a meal.    [provider]   Phenazopyrid-Cranbry-C-Probiot (AZO URINARY TRACT SUPPORT PO) Take 2 tablets by mouth daily.    [provider]  Probiotic Product (PROBIOTIC DAILY PO) Take 1 capsule by mouth daily.    [provider]  rivaroxaban  (XARELTO ) 20 MG TABS tablet Take 20 mg by mouth daily with supper.    [provider]  Turmeric 450 MG CAPS Take 450 mg by mouth daily.    [provider]    Scheduled Meds:  aspirin  EC  81 mg Oral Daily   insulin  aspart  0-9 Units Subcutaneous Q4H   Continuous Infusions:  PRN Meds: nitroGLYCERIN , ondansetron  **OR** ondansetron  (ZOFRAN ) IV  Allergies:    Allergies  Allergen Reactions   Nsaids Other (See Comments)    Caused Ulcers.   Cephalexin     unknown   Darvocet [Propoxyphene N-Acetaminophen ] Nausea Only   Percocet [Oxycodone -Acetaminophen ] Nausea Only   Penicillins Rash and Other (See Comments)    Has [name] had a PCN reaction causing immediate rash, facial/tongue/throat swelling, SOB or lightheadedness with hypotension: Yes Has [name] had a PCN reaction causing severe rash involving mucus membranes or skin necrosis: No Has [name] had a PCN reaction that required hospitalization No Has [name] had a PCN reaction occurring within the last 10 years: Yes If all of the above answers are "NO", then may proceed with Cephalosporin use.     Social History:   Social History   Socioeconomic History   Marital status: Married    Spouse name: Not on file   Number of children: Not on file   Years of education: Not on file   Highest education level: Not on file  Occupational History   Not on file  Tobacco Use   Smoking status: Never    Passive exposure: Never   Smokeless tobacco: Never  Vaping Use   Vaping status: Never Used  Substance and Sexual Activity   Alcohol use: No    Alcohol/week: 0.0 standard drinks of alcohol   Drug use: No   Sexual activity: Not Currently    Birth control/protection: Post-menopausal  Other  Topics Concern   Not on file  Social History Narrative   Not on file     Family History:   Family History  Problem Relation Age of Onset  Alzheimer's disease Mother    Kidney disease Mother    Kidney disease Father    Diabetes Maternal Grandmother    Diabetes Maternal Grandfather    Cancer Paternal Grandmother        breast, liver   Hypertension Son    Cancer Maternal Aunt        leukemia   Liver disease Maternal Uncle    Kidney disease Paternal Uncle        kidney removed   Colon cancer Neg Hx      ROS:  Please see the history of present illness.  All other ROS reviewed and negative.     Physical Exam/Data: Vitals:   05/27/24 2352 05/28/24 0009 05/28/24 0318 05/28/24 0343  BP:  103/85  (!) 137/53  Pulse:  74  72  Resp:  16  16  Temp: 98 F (36.7 C) 97.7 F (36.5 C)  98.2 F (36.8 C)  TempSrc: Oral Oral  Oral  SpO2:  95% 96% 96%  Weight:  80 kg    Height:       No intake or output data in the 24 hours ending 05/28/24 0848    05/28/2024   12:09 AM 05/27/2024    6:24 PM 03/07/2024   11:05 AM  Last 3 Weights  Weight (lbs) 176 lb 5.9 oz 175 lb 187 lb 3.2 oz  Weight (kg) 80 kg 79.379 kg 84.913 kg     Body mass index is 30.27 kg/m.  General: Laying in bed, in no acute distress, husband at bedside.  HEENT: normal Neck: no JVD Vascular: No carotid bruits; Distal pulses 2+ bilaterally Cardiac:  normal S1, S2; RRR; no murmur  Lungs:  clear to auscultation bilaterally, no wheezing, rhonchi or rales  Abd: soft, nontender, no hepatomegaly  Ext: no edema Musculoskeletal:  No deformities, BUE and BLE strength normal and equal Skin: warm and dry  Neuro:  CNs 2-12 intact, no focal abnormalities noted Psych:  Normal affect   EKG:  The EKG was personally reviewed and demonstrates:   NSR, HR 99, first-degree AV block PR 232 MS, baseline artifact Repeat EKG: NSR, HR 82, first-degree AV block PR 212 MS Telemetry:  Telemetry was personally reviewed and demonstrates:    NSR, 1st degree AV block, HR 60-70's, rare PVC  Relevant CV Studies: Cardiac CT 09/2023 IMPRESSION: 1. There is normal pulmonary vein drainage into the left atrium with ostial measurements above.   2. There is no thrombus in the left atrial appendage.   3. The esophagus runs in the left atrial midline and is not in proximity to any of the pulmonary vein ostia.   4. No PFO/ASD.   5. Normal coronary origin. Right dominance.   6. CAC score of 0 which is 0 percentile for age-, race-, and sex-matched controls.  Monitor 06/2023   Patch wear time was for 13 days and 23 hours.   Normal sinus rhythm predominantly ranging from 30 to 114 bpm with an average HR 65 bpm.   1 run of brief SVT lasting 5 beats with a maximum rate of 108 bpm.   24% burden of coarse atrial fibrillation versus atrial flutter, ranging from 30 to 114 bpm with an average HR 64 bpm, the longest interval lasted for 2 hours 42 minutes with an average HR 58 bpm.   First-degree AV block is noted.  17 pauses occurred, longest lasting 4.6 seconds.   <1% PAC burden and <1% PVC burden.   [name] symptoms correlated with  A-fib/flutter (53 to 72 bpm) and NSR (54 to 62 bpm).  ECHO 05/2023 IMPRESSIONS   1. Left ventricular ejection fraction, by estimation, is 60 to 65%. The  left ventricle has normal function. The left ventricle has no regional  wall motion abnormalities. There is mild left ventricular hypertrophy.  Left ventricular diastolic parameters  are consistent with Grade I diastolic dysfunction (impaired relaxation).   2. Right ventricular systolic function is normal. The right ventricular  size is normal. There is normal pulmonary artery systolic pressure. The  estimated right ventricular systolic pressure is 29.8 mmHg.   3. The mitral valve is grossly normal. Trivial mitral valve  regurgitation.   4. The aortic valve is tricuspid. Aortic valve regurgitation is not  visualized. Aortic valve mean gradient measures 6.0  mmHg.   5. The inferior vena cava is normal in size with greater than 50%  respiratory variability, suggesting right atrial pressure of 3 mmHg.   Comparison(s): Prior images unable to be directly viewed, comparison made  by report only.   Lexiscan  10/2017 Narrative & Impression  No diagnostic ST segment changes to indicate ischemia. Small, mild intensity, partially reversible mid anteroseptal defect that is most consistent with variable breast tissue attenuation. Small, mild intensity, fixed apical inferolateral defect that is most consistent with soft tissue attenuation. This is a low risk study. Nuclear stress EF: 76%.   Carotid US  04/2009 Findings:    RIGHT CAROTID ARTERY: Early intimal thickening.  No significant  plaque formation or stenosis.    RIGHT VERTEBRAL ARTERY:  Antegrade flow.    LEFT CAROTID ARTERY: Early intimal thickening.  No significant  plaque formation or stenosis.    LEFT VERTEBRAL ARTERY:  Antegrade flow.    IMPRESSION:  Early intimal thickening bilaterally.  Otherwise unremarkable  study.   Laboratory Data: High Sensitivity Troponin:   Recent Labs  Lab 05/27/24 1854 05/27/24 2021  TROPONINIHS 3 3     Chemistry Recent Labs  Lab 05/27/24 1854 05/28/24 0402  NA 137 136  K 3.9 3.3*  CL 101 103  CO2 26 24  GLUCOSE 159* 101*  BUN 22 20  CREATININE 0.94 0.73  CALCIUM 9.5 9.4  MG  --  1.9  GFRNONAA >60 >60  ANIONGAP 10 9    Recent Labs  Lab 05/28/24 0402  PROT 6.1*  ALBUMIN 3.7  AST 21  ALT 30  ALKPHOS 72  BILITOT 0.7   Lipids No results for input(s): "CHOL", "TRIG", "HDL", "LABVLDL", "LDLCALC", "CHOLHDL" in the last 168 hours.  Hematology Recent Labs  Lab 05/27/24 1854 05/28/24 0402  WBC 8.8 7.4  RBC 4.73 4.29  HGB 14.1 12.6  HCT 43.0 38.2  MCV 90.9 89.0  MCH 29.8 29.4  MCHC 32.8 33.0  RDW 15.1 15.3  PLT 274 240   Thyroid  No results for input(s): "TSH", "FREET4" in the last 168 hours.  BNPNo results for input(s):  "BNP", "PROBNP" in the last 168 hours.  DDimer No results for input(s): "DDIMER" in the last 168 hours.  Radiology/Studies:  DG Chest 2 View Result Date: 05/27/2024 CLINICAL DATA:  cp EXAM: CHEST - 2 VIEW COMPARISON:  None available. FINDINGS: No focal airspace consolidation, pleural effusion, or pneumothorax. No cardiomegaly. Large hiatal hernia containing an air-fluid level. No acute fracture or destructive lesion. Cervical fusion hardware again noted. Cholecystectomy clips. Multilevel thoracic osteophytosis. IMPRESSION: No acute cardiopulmonary abnormality. Electronically Signed   By: Rance Burrows M.D.   On: 05/27/2024 19:18     Assessment and Plan:  Chest pain  - 10/2017 Lexiscan : No ischemia, low risk, EF 76%, fixed apical inferior lateral defect C/W soft tissue attenuation, reversible mid anterior septal defect C/W breast tissue attenuation - 09/2023 Cardiac CT: Normal coronary ordering, CAC score of 0 - 02/2024: LDL 47 - Reported sudden onset of left-sided CP for 1 hour yesterday, occurred at rest while eating dinner, radiated to the neck, described as throbbing, some relief with leaning back. Completely relief after 3 NTG in ED. Associated with N/V and SOB. No CP this am.  - TN negative 3 >3  - EKG without ischemic changes  - Not likely ischemia with negative TN, EKG, normal LDL and normal cardiac CT. Can reconsider based on ECHO results  - Discussed stress test if ECHO was normal. [name] would prefer Lexiscan  over exercise since unable to walk on treadmill due to knees.  - continue ASA 81 mg  Palpations  - Reported palpitations "heart racing" lasting for 1 hr at same time. Not able to measure HR at the time. Has not needed to take Lopressor . No other recent palpitations noted. - Tele: NSR, 1st degree AV bloc, HR 60-70's with rare PVC - continue to monitor  - Discussed event monitor, however, with rare palpitations may not be helpful. Will discuss with MD.   Hypokalemia - initial  K 3.9 now 3.3. MG 1.9. ordered K supplement  - Home med: KCL 20 MEQ daily   Chronic HFpEF - 05/2023 ECHO: EF 60 to 65%, mild LVH, G1DD, trivial MVR. ECHO PENDING  - SOB with minimal exertion x months, relieved with rest.  - Appear Euvolemic. No need for diuresis at this time - Home med: Lasix  20 mg BID, KCL 20 MEQ daily - No episode of LE edema for some time. Can consider reducing home lasix  to 20 mg daily   HTN  - BP this am 123/84, seem controlled this admission.   - Does not take BP medication. BP has been controlled his ablation.   Paroxysmal atrial flutter -  06/2023 Event Monitor: 24% A-fib burden and 17 pauses, longest lasting 4.6 seconds  - 09/2023: PVI ([facility]) and posterior wall ablation/isolation with no recurrence of atrial fibrillation  - No sign of a flutter this admission, rate controlled - Home meds: Lopressor  12.5 mg PRN with HR > 100, Xarelto  20 mg daily  -  Continue Xarelto  20 mg, which is the appropriate dose.   OSA  - Home study showed mild OSA - not complaint with CPAP due to discomfort - Encouraged to follow up with Dr. Micael Adas  DM2 -12/2023 A1c 6.3 - managed by primary   Risk Assessment/Risk Scores:  New York  Heart Association (NYHA) Functional Class NYHA Class III  CHA2DS2-VASc Score = 5   This indicates a 7.2% annual risk of stroke. The [name]'s score is based upon: CHF History: 1 HTN History: 0 Diabetes History: 1 Stroke History: 0 Vascular Disease History: 0 Age Score: 2 Gender Score: 1    For questions or updates, please contact [facility] Please consult [email or web address] for contact info under    Signed, Metta Actis, PA-C  05/28/2024 8:48 AM    Attending Note  [name] seen and discussed with PA Jorie Newness, I agree with her documentation. 78 yo female history of chronic diastolic HF, afib/aflutter, prior PE, DM2, asthma, presents with chest pain.   Episode occurred while eating dinner. Initially felt a strong pounding feeling  in chest with some associated nausea. The pounding feeling resolved but had a lingering  chest pressure. Combined symptoms lasted about 2 hours. No other associated symptoms. Reports some improvement with NGx3 in ER.    K 3.9 BUN 22 Cr 0.9  WBC 8.8 Hgb 14.1 Plt 274  Trop neg x 2 EKG SR, no ischemic changes Echo pending CXR no acute process    1.Chest pain - unclear etiology - initially a strong pounding feeling in chest possibly suggestive of a symptomatic arrhythmia, however developed a subsequent pressure in the chest. Total symptoms lasted about 2 hours.  - no objective evidence of ischemia by EKG or enzymes - echo is pending - consider lexiscan  pending echo results. Stress testing would be tomorrow pending echo, can eat today and make npo tonight.    2. Afib/aflutter - EKG shows NSR - she is on xarelto  for stroke prevention  Armida Lander MD

## 2024-05-28 NOTE — Progress Notes (Signed)
 Transition of Care Department [facility]) has reviewed patient and no other TOC needs have been identified at this time. We will continue to monitor patient advancement through interdisciplinary progression rounds. If new patient transition needs arise, please place a TOC consult.   05/28/24 1124  TOC Brief Assessment  Insurance and Status Reviewed  Patient has primary care physician Yes  Home environment has been reviewed Lives with husband.  Prior level of function: Independent.  Prior/Current Home Services No current home services  Social Drivers of Health Review SDOH reviewed no interventions necessary  Readmission risk has been reviewed Yes  Transition of care needs no transition of care needs at this time

## 2024-05-28 NOTE — Care Management Obs Status (Signed)
 MEDICARE OBSERVATION STATUS NOTIFICATION   Patient Details  Name: [name] MRN: [id number] Date of Birth: 11-03-1946   Medicare Observation Status Notification Given:  Yes    [name] L [name] 05/28/2024, 3:24 PM

## 2024-05-28 NOTE — Progress Notes (Signed)
 PROGRESS NOTE    Hailey MCGAHEE  EAV:[id number] DOB: [birth year] DOA: 05/27/2024 PCP: [name], MD   Brief Narrative:    [name] is a 78 y.o. female with medical history significant of type 2 diabetes mellitus, GERD, neuropathy, anxiety, atrial fibrillation s/p ablation (09/2023) who presents to the emergency department due to sudden onset of left-sided chest pain which started about 1 hour prior to arrival.  Patient was admitted for evaluation of chest pain and 2D echocardiogram did not demonstrate any acute abnormalities.  Cardiology plans for stress test in AM.  Assessment & Plan:   Principal Problem:   Chest pain Active Problems:   Neuropathy   GERD (gastroesophageal reflux disease)   Type 2 diabetes mellitus with hyperglycemia (HCC)   Persistent atrial fibrillation (HCC)   Obesity, Class I, BMI 30-34.9  Assessment and Plan:  Chest pain-resolved Of unclear etiology 2D echocardiogram without significant findings, plan for stress test in a.m. per cardiology N.p.o. after midnight  Mild hypokalemia Replete and reevaluate in a.m.   Type 2 diabetes mellitus with hyperglycemia-improved Hemoglobin A1c on 01/25/2024 was 6.3 Continue ISS and hypoglycemia protocol   Persistent atrial fibrillation EKG personally reviewed showed normal sinus rhythm with rate of 82 bpm with borderline prolonged PR interval Continue Xarelto  Lopressor  will be temporarily held pending cardiology evaluation for possible stress test   GERD (gastroesophageal reflux disease) Continue PPI   Neuropathy Continue gabapentin    Obesity class I (BMI 30.27) Diet and lifestyle modification   DVT prophylaxis: Xarelto  Code Status: Full Family Communication: At bedside 6/3 Disposition Plan:  Status is: Observation The patient will require care spanning > 2 midnights and should be moved to inpatient because: Need for inpatient stress test in AM.   Consultants:  Cardiology  Procedures:   None  Antimicrobials:  None   Subjective: Patient seen and evaluated today with no new acute complaints or concerns. No acute concerns or events noted overnight.  Denies any further chest pain or shortness of breath.  Objective: Vitals:   05/28/24 0318 05/28/24 0343 05/28/24 0800 05/28/24 1214  BP:  (!) 137/53 123/84 (!) 131/58  Pulse:  72 70 71  Resp:  16 17 16   Temp:  98.2 F (36.8 C) 98.4 F (36.9 C) 98.6 F (37 C)  TempSrc:  Oral Oral Oral  SpO2: 96% 96% 96% 97%  Weight:      Height:        Intake/Output Summary (Last 24 hours) at 05/28/2024 1637 Last data filed at 05/28/2024 1300 Gross per 24 hour  Intake 240 ml  Output --  Net 240 ml   Filed Weights   05/27/24 1824 05/28/24 0009  Weight: 79.4 kg 80 kg    Examination:  General exam: Appears calm and comfortable  Respiratory system: Clear to auscultation. Respiratory effort normal. Cardiovascular system: S1 & S2 heard, RRR.  Gastrointestinal system: Abdomen is soft Central nervous system: Alert and awake Extremities: No edema Skin: No significant lesions noted Psychiatry: Flat affect.    Data Reviewed: I have personally reviewed following labs and imaging studies  CBC: Recent Labs  Lab 05/27/24 1854 05/28/24 0402  WBC 8.8 7.4  HGB 14.1 12.6  HCT 43.0 38.2  MCV 90.9 89.0  PLT 274 240   Basic Metabolic Panel: Recent Labs  Lab 05/27/24 1854 05/28/24 0402  NA 137 136  K 3.9 3.3*  CL 101 103  CO2 26 24  GLUCOSE 159* 101*  BUN 22 20  CREATININE 0.94 0.73  CALCIUM 9.5 9.4  MG  --  1.9  PHOS  --  3.6   GFR: Estimated Creatinine Clearance: 60.2 mL/min (by C-G formula based on SCr of 0.73 mg/dL). Liver Function Tests: Recent Labs  Lab 05/28/24 0402  AST 21  ALT 30  ALKPHOS 72  BILITOT 0.7  PROT 6.1*  ALBUMIN 3.7   No results for input(s): "LIPASE", "AMYLASE" in the last 168 hours. No results for input(s): "AMMONIA" in the last 168 hours. Coagulation Profile: No results for  input(s): "INR", "PROTIME" in the last 168 hours. Cardiac Enzymes: No results for input(s): "CKTOTAL", "CKMB", "CKMBINDEX", "TROPONINI" in the last 168 hours. BNP (last 3 results) No results for input(s): "PROBNP" in the last 8760 hours. HbA1C: No results for input(s): "HGBA1C" in the last 72 hours. CBG: Recent Labs  Lab 05/28/24 0345 05/28/24 0713 05/28/24 1105  GLUCAP 117* 120* 112*   Lipid Profile: No results for input(s): "CHOL", "HDL", "LDLCALC", "TRIG", "CHOLHDL", "LDLDIRECT" in the last 72 hours. Thyroid  Function Tests: No results for input(s): "TSH", "T4TOTAL", "FREET4", "T3FREE", "THYROIDAB" in the last 72 hours. Anemia Panel: No results for input(s): "VITAMINB12", "FOLATE", "FERRITIN", "TIBC", "IRON", "RETICCTPCT" in the last 72 hours. Sepsis Labs: No results for input(s): "PROCALCITON", "LATICACIDVEN" in the last 168 hours.  No results found for this or any previous visit (from the past 240 hours).       Radiology Studies: ECHOCARDIOGRAM COMPLETE Result Date: 05/28/2024    ECHOCARDIOGRAM REPORT   Patient Name:   [name] Date of Exam: 05/28/2024 Medical Rec #:  [id number]      Height:       64.0 in Accession #:    [id number]     Weight:       176.4 lb Date of Birth:  04/30/1946      BSA:          1.855 m Patient Age:    77 years       BP:           131/58 mmHg Patient Gender: F              HR:           73 bpm. Exam [facility] Procedure: 2D Echo, Color Doppler and Cardiac Doppler (Both Spectral and Color            Flow Doppler were utilized during procedure). Indications:    chest pain  History:        Patient has no prior history of Echocardiogram examinations.                 Signs/Symptoms:Chest Pain; Risk Factors:Diabetes.  Sonographer:    Jeralene Mom Referring Phys: [id number] OLADAPO ADEFESO IMPRESSIONS  1. Left ventricular ejection fraction, by estimation, is 60 to 65%. The left ventricle has normal function. The left ventricle has no regional wall  motion abnormalities. Left ventricular diastolic parameters are indeterminate. Elevated left atrial pressure.  2. Right ventricular systolic function is normal. The right ventricular size is normal. There is normal pulmonary artery systolic pressure.  3. The mitral valve is abnormal. Mild mitral valve regurgitation. No evidence of mitral stenosis.  4. The aortic valve is tricuspid. Aortic valve regurgitation is not visualized. No aortic stenosis is present.  5. The inferior vena cava is normal in size with greater than 50% respiratory variability, suggesting right atrial pressure of 3 mmHg. FINDINGS  Left Ventricle: Left ventricular ejection fraction, by estimation, is 60 to 65%. The  left ventricle has normal function. The left ventricle has no regional wall motion abnormalities. The left ventricular internal cavity size was normal in size. There is  no left ventricular hypertrophy. Left ventricular diastolic parameters are indeterminate. Elevated left atrial pressure. Right Ventricle: The right ventricular size is normal. Right vetricular wall thickness was not well visualized. Right ventricular systolic function is normal. There is normal pulmonary artery systolic pressure. The tricuspid regurgitant velocity is 2.81 m/s, and with an assumed right atrial pressure of 3 mmHg, the estimated right ventricular systolic pressure is 34.6 mmHg. Left Atrium: Left atrial size was normal in size. Right Atrium: Right atrial size was normal in size. Pericardium: There is no evidence of pericardial effusion. Mitral Valve: The mitral valve is abnormal. Mild mitral valve regurgitation. No evidence of mitral valve stenosis. MV peak gradient, 14.7 mmHg. The mean mitral valve gradient is 8.0 mmHg. Tricuspid Valve: The tricuspid valve is normal in structure. Tricuspid valve regurgitation is not demonstrated. No evidence of tricuspid stenosis. Aortic Valve: The aortic valve is tricuspid. Aortic valve regurgitation is not visualized. No  aortic stenosis is present. Aortic valve mean gradient measures 8.3 mmHg. Aortic valve peak gradient measures 14.0 mmHg. Aortic valve area, by VTI measures 2.08  cm. Pulmonic Valve: The pulmonic valve was not well visualized. Pulmonic valve regurgitation is not visualized. No evidence of pulmonic stenosis. Aorta: The aortic root and ascending aorta are structurally normal, with no evidence of dilitation. Venous: The inferior vena cava is normal in size with greater than 50% respiratory variability, suggesting right atrial pressure of 3 mmHg. IAS/Shunts: No atrial level shunt detected by color flow Doppler.  LEFT VENTRICLE PLAX 2D LVIDd:         3.60 cm   Diastology LVIDs:         2.50 cm   LV e' medial:    5.98 cm/s LV PW:         0.80 cm   LV E/e' medial:  27.1 LV IVS:        0.80 cm   LV e' lateral:   8.38 cm/s LVOT diam:     2.00 cm   LV E/e' lateral: 19.3 LV SV:         90 LV SV Index:   49 LVOT Area:     3.14 cm  RIGHT VENTRICLE RV Basal diam:  3.10 cm RV Mid diam:    2.70 cm RV S prime:     11.30 cm/s LEFT ATRIUM             Index        RIGHT ATRIUM           Index LA diam:        3.30 cm 1.78 cm/m   RA Area:     13.80 cm LA Vol (A2C):   56.4 ml 30.41 ml/m  RA Volume:   31.80 ml  17.15 ml/m LA Vol (A4C):   53.6 ml 28.90 ml/m LA Biplane Vol: 58.0 ml 31.27 ml/m  AORTIC VALVE AV Area (Vmax):    2.35 cm AV Area (Vmean):   2.25 cm AV Area (VTI):     2.08 cm AV Vmax:           187.33 cm/s AV Vmean:          136.333 cm/s AV VTI:            0.434 m AV Peak Grad:      14.0 mmHg AV Mean Grad:  8.3 mmHg LVOT Vmax:         140.00 cm/s LVOT Vmean:        97.700 cm/s LVOT VTI:          0.288 m LVOT/AV VTI ratio: 0.66  AORTA Ao Root diam: 2.80 cm Ao Asc diam:  2.90 cm MITRAL VALVE                TRICUSPID VALVE MV Area (PHT): 4.10 cm     TR Peak grad:   31.6 mmHg MV Area VTI:   2.16 cm     TR Vmax:        281.00 cm/s MV Peak grad:  14.7 mmHg MV Mean grad:  8.0 mmHg     SHUNTS MV Vmax:       1.92 m/s      Systemic VTI:  0.29 m MV Vmean:      131.0 cm/s   Systemic Diam: 2.00 cm MV Decel Time: 185 msec MR Peak grad: 89.1 mmHg MR Vmax:      472.00 cm/s MV E velocity: 162.00 cm/s MV A velocity: 128.00 cm/s MV E/A ratio:  1.27 Armida Lander MD Electronically signed by Armida Lander MD Signature Date/Time: 05/28/2024/4:31:24 PM    Final    DG Chest 2 View Result Date: 05/27/2024 CLINICAL DATA:  cp EXAM: CHEST - 2 VIEW COMPARISON:  None available. FINDINGS: No focal airspace consolidation, pleural effusion, or pneumothorax. No cardiomegaly. Large hiatal hernia containing an air-fluid level. No acute fracture or destructive lesion. Cervical fusion hardware again noted. Cholecystectomy clips. Multilevel thoracic osteophytosis. IMPRESSION: No acute cardiopulmonary abnormality. Electronically Signed   By: Rance Burrows M.D.   On: 05/27/2024 19:18        Scheduled Meds:  aspirin  EC  81 mg Oral Daily   gabapentin   300 mg Oral QHS   insulin  aspart  0-9 Units Subcutaneous Q4H   pantoprazole   40 mg Oral Daily   potassium chloride   40 mEq Oral BID   rivaroxaban   20 mg Oral Q supper     LOS: 0 days    Time spent: 55 minutes    Orla Estrin Loran Rock, DO [facility]  If 7PM-7AM, please contact night-coverage [email or web address] 05/28/2024, 4:37 PM

## 2024-05-28 NOTE — Progress Notes (Incomplete)
 Attending Note  Patient seen and discussed with PA Jorie Newness, I agree with her documentation. 78 yo female history of chronic diastolic HF, afib/aflutter, prior PE, DM2, asthma, presents with chest pain.   Episode occurred while eating dinner. Initially felt a strong pounding feeling in chest with some associated nausea. The pounding feeling resolved but had a lingering chest pressure. Combined symptoms lasted about 2 hours. No other associated symptoms. Reports some improvement with NGx3 in ER.    K 3.9 BUN 22 Cr 0.9  WBC 8.8 Hgb 14.1 Plt 274  Trop neg x 2 EKG SR, no ischemic changes Echo pending CXR no acute process    1.Chest pain - unclear etiology - initially a strong pounding feeling in chest possibly suggestive of a symptomatic arrhythmia, however developed a subsequent pressure in the chest. Total symptoms lasted about 2 hours.  - no objective evidence of ischemia by EKG or enzymes - echo is pending - consider lexiscan  pending echo results. Stress testing would be tomorrow pending echo, can eat today and make npo tonight.    2. Afib/aflutter - EKG shows NSR - she is on xarelto  for stroke prevention  Armida Lander MD

## 2024-05-28 NOTE — Progress Notes (Signed)
*  PRELIMINARY RESULTS* Echocardiogram 2D Echocardiogram has been performed.  [name] 05/28/2024, 2:17 PM

## 2024-05-28 NOTE — Care Management Obs Status (Deleted)
 MEDICARE OBSERVATION STATUS NOTIFICATION   Patient Details  Name: [name] MRN: [id number] Date of Birth: 21-May-1946   Medicare Observation Status Notification Given:  Yes    Neila Bally 05/28/2024, 2:43 PM

## 2024-05-29 ENCOUNTER — Observation Stay (HOSPITAL_COMMUNITY)

## 2024-05-29 ENCOUNTER — Encounter (HOSPITAL_COMMUNITY): Payer: Self-pay | Admitting: Internal Medicine

## 2024-05-29 DIAGNOSIS — R0789 Other chest pain: Secondary | ICD-10-CM | POA: Diagnosis not present

## 2024-05-29 DIAGNOSIS — R079 Chest pain, unspecified: Secondary | ICD-10-CM | POA: Diagnosis not present

## 2024-05-29 DIAGNOSIS — R072 Precordial pain: Secondary | ICD-10-CM | POA: Diagnosis not present

## 2024-05-29 DIAGNOSIS — E1165 Type 2 diabetes mellitus with hyperglycemia: Secondary | ICD-10-CM | POA: Diagnosis not present

## 2024-05-29 DIAGNOSIS — I4892 Unspecified atrial flutter: Secondary | ICD-10-CM | POA: Diagnosis not present

## 2024-05-29 DIAGNOSIS — I4891 Unspecified atrial fibrillation: Secondary | ICD-10-CM | POA: Diagnosis not present

## 2024-05-29 DIAGNOSIS — I4819 Other persistent atrial fibrillation: Secondary | ICD-10-CM | POA: Diagnosis not present

## 2024-05-29 LAB — COMPREHENSIVE METABOLIC PANEL WITH GFR
ALT: 35 U/L (ref 0–44)
AST: 27 U/L (ref 15–41)
Albumin: 3.4 g/dL — ABNORMAL LOW (ref 3.5–5.0)
Alkaline Phosphatase: 72 U/L (ref 38–126)
Anion gap: 4 — ABNORMAL LOW (ref 5–15)
BUN: 15 mg/dL (ref 8–23)
CO2: 24 mmol/L (ref 22–32)
Calcium: 9.4 mg/dL (ref 8.9–10.3)
Chloride: 109 mmol/L (ref 98–111)
Creatinine, Ser: 0.73 mg/dL (ref 0.44–1.00)
GFR, Estimated: >60 mL/min (ref >60)
Glucose, Bld: 118 mg/dL — ABNORMAL HIGH (ref 70–99)
Potassium: 4.4 mmol/L (ref 3.5–5.1)
Sodium: 137 mmol/L (ref 135–145)
Total Bilirubin: 0.7 mg/dL (ref 0.0–1.2)
Total Protein: 5.9 g/dL — ABNORMAL LOW (ref 6.5–8.1)

## 2024-05-29 LAB — NM MYOCAR MULTI W/SPECT W/WALL MOTION / EF
Base ST Depression (mm): 0 mm
LV dias vol: 48 mL (ref 46–106)
LV sys vol: 9 mL
MPHR: 143 {beats}/min
Nuc Stress EF: 81 %
Peak HR: 10 {beats}/min
Percent HR: 76 %
RATE: 0.4
Rest HR: 77 {beats}/min
Rest Nuclear Isotope Dose: 10.6 mCi
SDS: 6
SRS: 8
SSS: 14
ST Depression (mm): 0 mm
Stress Nuclear Isotope Dose: 33 mCi
TID: 0.84

## 2024-05-29 LAB — CBC
HCT: 39.9 % (ref 36.0–46.0)
Hemoglobin: 13.2 g/dL (ref 12.0–15.0)
MCH: 30 pg (ref 26.0–34.0)
MCHC: 33.1 g/dL (ref 30.0–36.0)
MCV: 90.7 fL (ref 80.0–100.0)
Platelets: 248 10*3/uL (ref 150–400)
RBC: 4.4 MIL/uL (ref 3.87–5.11)
RDW: 15.1 % (ref 11.5–15.5)
WBC: 6 10*3/uL (ref 4.0–10.5)
nRBC: 0 % (ref 0.0–0.2)

## 2024-05-29 LAB — GLUCOSE, CAPILLARY
Glucose-Capillary: 107 mg/dL — ABNORMAL HIGH (ref 70–99)
Glucose-Capillary: 114 mg/dL — ABNORMAL HIGH (ref 70–99)
Glucose-Capillary: 118 mg/dL — ABNORMAL HIGH (ref 70–99)
Glucose-Capillary: 118 mg/dL — ABNORMAL HIGH (ref 70–99)
Glucose-Capillary: 146 mg/dL — ABNORMAL HIGH (ref 70–99)
Glucose-Capillary: 161 mg/dL — ABNORMAL HIGH (ref 70–99)

## 2024-05-29 LAB — APTT: aPTT: 29 s (ref 24–36)

## 2024-05-29 LAB — HEPARIN LEVEL (UNFRACTIONATED): Heparin Unfractionated: 1.1 [IU]/mL — ABNORMAL HIGH (ref 0.30–0.70)

## 2024-05-29 LAB — MAGNESIUM: Magnesium: 2 mg/dL (ref 1.7–2.4)

## 2024-05-29 MED ORDER — TECHNETIUM TC 99M TETROFOSMIN IV KIT
30.0000 | PACK | Freq: Once | INTRAVENOUS | Status: AC | PRN
  Administered 2024-05-29: 33 via INTRAVENOUS

## 2024-05-29 MED ORDER — POTASSIUM CHLORIDE CRYS ER 20 MEQ PO TBCR
20.0000 meq | EXTENDED_RELEASE_TABLET | Freq: Every evening | ORAL | Status: DC
  Administered 2024-05-29: 20 meq via ORAL
  Filled 2024-05-29: qty 1

## 2024-05-29 MED ORDER — SODIUM CHLORIDE FLUSH 0.9 % IV SOLN
INTRAVENOUS | Status: AC
  Filled 2024-05-29: qty 10

## 2024-05-29 MED ORDER — HEPARIN (PORCINE) 25000 UT/250ML-% IV SOLN
900.0000 [IU]/h | INTRAVENOUS | Status: DC
  Administered 2024-05-29: 900 [IU]/h via INTRAVENOUS
  Filled 2024-05-29: qty 250

## 2024-05-29 MED ORDER — ZOLPIDEM TARTRATE 5 MG PO TABS: 10.0000 mg | ORAL_TABLET | Freq: Every evening | ORAL | Status: DC | PRN

## 2024-05-29 MED ORDER — MONTELUKAST SODIUM 10 MG PO TABS
10.0000 mg | ORAL_TABLET | Freq: Every day | ORAL | Status: DC
  Administered 2024-05-29: 10 mg via ORAL
  Filled 2024-05-29: qty 1

## 2024-05-29 MED ORDER — REGADENOSON 0.4 MG/5ML IV SOLN
INTRAVENOUS | Status: AC
  Administered 2024-05-29: 0.4 mg via INTRAVENOUS
  Filled 2024-05-29: qty 5

## 2024-05-29 MED ORDER — SODIUM CHLORIDE 0.9 % WEIGHT BASED INFUSION
3.0000 mL/kg/h | INTRAVENOUS | Status: DC
  Administered 2024-05-30: 3 mL/kg/h via INTRAVENOUS

## 2024-05-29 MED ORDER — ESCITALOPRAM OXALATE 20 MG PO TABS
20.0000 mg | ORAL_TABLET | Freq: Every day | ORAL | Status: DC
  Administered 2024-05-29 – 2024-05-30 (×2): 20 mg via ORAL
  Filled 2024-05-29: qty 1
  Filled 2024-05-29: qty 2

## 2024-05-29 MED ORDER — FUROSEMIDE 20 MG PO TABS
20.0000 mg | ORAL_TABLET | Freq: Two times a day (BID) | ORAL | Status: DC
  Administered 2024-05-29 – 2024-05-30 (×2): 20 mg via ORAL
  Filled 2024-05-29 (×2): qty 1

## 2024-05-29 MED ORDER — INSULIN ASPART 100 UNIT/ML IJ SOLN
0.0000 [IU] | Freq: Three times a day (TID) | INTRAMUSCULAR | Status: DC
  Administered 2024-05-29: 2 [IU] via SUBCUTANEOUS
  Administered 2024-05-30: 1 [IU] via SUBCUTANEOUS

## 2024-05-29 MED ORDER — ZOLPIDEM TARTRATE 5 MG PO TABS
5.0000 mg | ORAL_TABLET | Freq: Every evening | ORAL | Status: DC | PRN
  Administered 2024-05-29: 5 mg via ORAL
  Filled 2024-05-29 (×2): qty 1

## 2024-05-29 MED ORDER — SODIUM CHLORIDE 0.9 % WEIGHT BASED INFUSION
1.0000 mL/kg/h | INTRAVENOUS | Status: DC
  Administered 2024-05-30: 1 mL/kg/h via INTRAVENOUS

## 2024-05-29 MED ORDER — TECHNETIUM TC 99M TETROFOSMIN IV KIT
10.0000 | PACK | Freq: Once | INTRAVENOUS | Status: AC | PRN
  Administered 2024-05-29: 10.6 via INTRAVENOUS

## 2024-05-29 NOTE — Plan of Care (Signed)
  Problem: Education: Goal: Knowledge of General Education information will improve Description: Including pain rating scale, medication(s)/side effects and non-pharmacologic comfort measures Outcome: Progressing   Problem: Health Behavior/Discharge Planning: Goal: Ability to manage health-related needs will improve Outcome: Progressing   Problem: Health Behavior/Discharge Planning: Goal: Ability to manage health-related needs will improve Outcome: Progressing   

## 2024-05-29 NOTE — Progress Notes (Signed)
 Rounding Note   Patient Name: [name] Date of Encounter: 05/29/2024   [facility] Cardiologist: Vishnu P Mallipeddi, MD   Subjective No complaints  Scheduled Meds:  aspirin  EC  81 mg Oral Daily   gabapentin   300 mg Oral QHS   insulin  aspart  0-9 Units Subcutaneous Q4H   pantoprazole   40 mg Oral Daily   rivaroxaban   20 mg Oral Q supper   Continuous Infusions:  PRN Meds: nitroGLYCERIN , ondansetron  **OR** ondansetron  (ZOFRAN ) IV   Vital Signs  Vitals:   05/28/24 0343 05/28/24 0800 05/28/24 1214 05/28/24 1936  BP: (!) 137/53 123/84 (!) 131/58 (!) 150/69  Pulse: 72 70 71 70  Resp: 16 17 16 20   Temp: 98.2 F (36.8 C) 98.4 F (36.9 C) 98.6 F (37 C) 98.4 F (36.9 C)  TempSrc: Oral Oral Oral Oral  SpO2: 96% 96% 97% 93%  Weight:      Height:        Intake/Output Summary (Last 24 hours) at 05/29/2024 0958 Last data filed at 05/28/2024 2045 Gross per 24 hour  Intake 480 ml  Output --  Net 480 ml      05/28/2024   12:09 AM 05/27/2024    6:24 PM 03/07/2024   11:05 AM  Last 3 Weights  Weight (lbs) 176 lb 5.9 oz 175 lb 187 lb 3.2 oz  Weight (kg) 80 kg 79.379 kg 84.913 kg      Telemetry NSR - Personally Reviewed  ECG  N/a - Personally Reviewed  Physical Exam  GEN: No acute distress.   Neck: No JVD Cardiac: irregular Respiratory: Clear to auscultation bilaterally. GI: Soft, nontender, non-distended  MS: No edema; No deformity. Neuro:  Nonfocal  Psych: Normal affect   Labs High Sensitivity Troponin:   Recent Labs  Lab 05/27/24 1854 05/27/24 2021  TROPONINIHS 3 3     Chemistry Recent Labs  Lab 05/27/24 1854 05/28/24 0402 05/29/24 0412  NA 137 136 137  K 3.9 3.3* 4.4  CL 101 103 109  CO2 26 24 24   GLUCOSE 159* 101* 118*  BUN 22 20 15   CREATININE 0.94 0.73 0.73  CALCIUM 9.5 9.4 9.4  MG  --  1.9 2.0  PROT  --  6.1* 5.9*  ALBUMIN  --  3.7 3.4*  AST  --  21 27  ALT  --  30 35  ALKPHOS  --  72 72  BILITOT  --  0.7 0.7  GFRNONAA >60  >60 >60  ANIONGAP 10 9 4*    Lipids No results for input(s): "CHOL", "TRIG", "HDL", "LABVLDL", "LDLCALC", "CHOLHDL" in the last 168 hours.  Hematology Recent Labs  Lab 05/27/24 1854 05/28/24 0402 05/29/24 0412  WBC 8.8 7.4 6.0  RBC 4.73 4.29 4.40  HGB 14.1 12.6 13.2  HCT 43.0 38.2 39.9  MCV 90.9 89.0 90.7  MCH 29.8 29.4 30.0  MCHC 32.8 33.0 33.1  RDW 15.1 15.3 15.1  PLT 274 240 248   Thyroid  No results for input(s): "TSH", "FREET4" in the last 168 hours.  BNPNo results for input(s): "BNP", "PROBNP" in the last 168 hours.  DDimer No results for input(s): "DDIMER" in the last 168 hours.   Radiology  NM Myocar Multi W/Spect W/Wall Motion / EF Result Date: 05/29/2024   Findings are consistent with small to moderate distal inferior/inferolateral ischemia. Low to intermediate risk   No ST deviation was noted.   LV perfusion is abnormal.   Left ventricular function is normal. Nuclear stress EF:  81%. The left ventricular ejection fraction is hyperdynamic (>65%). End diastolic cavity size is normal.   Small mild intensity anterior defect most intense in the resting images with normal wall motion consistent with breast attenuation.   ECHOCARDIOGRAM COMPLETE Result Date: 05/28/2024    ECHOCARDIOGRAM REPORT   Patient Name:   [name] Date of Exam: 05/28/2024 Medical Rec #:  [id number]      Height:       64.0 in Accession #:    [id number]     Weight:       176.4 lb Date of Birth:  [birth year]      BSA:          1.855 m Patient Age:    78 years       BP:           131/58 mmHg Patient Gender: F              HR:           73 bpm. Exam Location[facility] Procedure: 2D Echo, Color Doppler and Cardiac Doppler (Both Spectral and Color            Flow Doppler were utilized during procedure). Indications:    chest pain  History:        Patient has no prior history of Echocardiogram examinations.                 Signs/Symptoms:Chest Pain; Risk Factors:Diabetes.  Sonographer:    Jeralene Mom Referring  Phys: [id number] OLADAPO ADEFESO IMPRESSIONS  1. Left ventricular ejection fraction, by estimation, is 60 to 65%. The left ventricle has normal function. The left ventricle has no regional wall motion abnormalities. Left ventricular diastolic parameters are indeterminate. Elevated left atrial pressure.  2. Right ventricular systolic function is normal. The right ventricular size is normal. There is normal pulmonary artery systolic pressure.  3. The mitral valve is abnormal. Mild mitral valve regurgitation. No evidence of mitral stenosis.  4. The aortic valve is tricuspid. Aortic valve regurgitation is not visualized. No aortic stenosis is present.  5. The inferior vena cava is normal in size with greater than 50% respiratory variability, suggesting right atrial pressure of 3 mmHg. FINDINGS  Left Ventricle: Left ventricular ejection fraction, by estimation, is 60 to 65%. The left ventricle has normal function. The left ventricle has no regional wall motion abnormalities. The left ventricular internal cavity size was normal in size. There is  no left ventricular hypertrophy. Left ventricular diastolic parameters are indeterminate. Elevated left atrial pressure. Right Ventricle: The right ventricular size is normal. Right vetricular wall thickness was not well visualized. Right ventricular systolic function is normal. There is normal pulmonary artery systolic pressure. The tricuspid regurgitant velocity is 2.81 m/s, and with an assumed right atrial pressure of 3 mmHg, the estimated right ventricular systolic pressure is 34.6 mmHg. Left Atrium: Left atrial size was normal in size. Right Atrium: Right atrial size was normal in size. Pericardium: There is no evidence of pericardial effusion. Mitral Valve: The mitral valve is abnormal. Mild mitral valve regurgitation. No evidence of mitral valve stenosis. MV peak gradient, 14.7 mmHg. The mean mitral valve gradient is 8.0 mmHg. Tricuspid Valve: The tricuspid valve is normal in  structure. Tricuspid valve regurgitation is not demonstrated. No evidence of tricuspid stenosis. Aortic Valve: The aortic valve is tricuspid. Aortic valve regurgitation is not visualized. No aortic stenosis is present. Aortic valve mean gradient measures 8.3 mmHg. Aortic valve peak gradient measures  14.0 mmHg. Aortic valve area, by VTI measures 2.08  cm. Pulmonic Valve: The pulmonic valve was not well visualized. Pulmonic valve regurgitation is not visualized. No evidence of pulmonic stenosis. Aorta: The aortic root and ascending aorta are structurally normal, with no evidence of dilitation. Venous: The inferior vena cava is normal in size with greater than 50% respiratory variability, suggesting right atrial pressure of 3 mmHg. IAS/Shunts: No atrial level shunt detected by color flow Doppler.  LEFT VENTRICLE PLAX 2D LVIDd:         3.60 cm   Diastology LVIDs:         2.50 cm   LV e' medial:    5.98 cm/s LV PW:         0.80 cm   LV E/e' medial:  27.1 LV IVS:        0.80 cm   LV e' lateral:   8.38 cm/s LVOT diam:     2.00 cm   LV E/e' lateral: 19.3 LV SV:         90 LV SV Index:   49 LVOT Area:     3.14 cm  RIGHT VENTRICLE RV Basal diam:  3.10 cm RV Mid diam:    2.70 cm RV S prime:     11.30 cm/s LEFT ATRIUM             Index        RIGHT ATRIUM           Index LA diam:        3.30 cm 1.78 cm/m   RA Area:     13.80 cm LA Vol (A2C):   56.4 ml 30.41 ml/m  RA Volume:   31.80 ml  17.15 ml/m LA Vol (A4C):   53.6 ml 28.90 ml/m LA Biplane Vol: 58.0 ml 31.27 ml/m  AORTIC VALVE AV Area (Vmax):    2.35 cm AV Area (Vmean):   2.25 cm AV Area (VTI):     2.08 cm AV Vmax:           187.33 cm/s AV Vmean:          136.333 cm/s AV VTI:            0.434 m AV Peak Grad:      14.0 mmHg AV Mean Grad:      8.3 mmHg LVOT Vmax:         140.00 cm/s LVOT Vmean:        97.700 cm/s LVOT VTI:          0.288 m LVOT/AV VTI ratio: 0.66  AORTA Ao Root diam: 2.80 cm Ao Asc diam:  2.90 cm MITRAL VALVE                TRICUSPID VALVE MV Area  (PHT): 4.10 cm     TR Peak grad:   31.6 mmHg MV Area VTI:   2.16 cm     TR Vmax:        281.00 cm/s MV Peak grad:  14.7 mmHg MV Mean grad:  8.0 mmHg     SHUNTS MV Vmax:       1.92 m/s     Systemic VTI:  0.29 m MV Vmean:      131.0 cm/s   Systemic Diam: 2.00 cm MV Decel Time: 185 msec MR Peak grad: 89.1 mmHg MR Vmax:      472.00 cm/s MV E velocity: 162.00 cm/s MV A velocity: 128.00 cm/s MV E/A ratio:  1.27  Armida Lander MD Electronically signed by Armida Lander MD Signature Date/Time: 05/28/2024/4:31:24 PM    Final    DG Chest 2 View Result Date: 05/27/2024 CLINICAL DATA:  cp EXAM: CHEST - 2 VIEW COMPARISON:  None available. FINDINGS: No focal airspace consolidation, pleural effusion, or pneumothorax. No cardiomegaly. Large hiatal hernia containing an air-fluid level. No acute fracture or destructive lesion. Cervical fusion hardware again noted. Cholecystectomy clips. Multilevel thoracic osteophytosis. IMPRESSION: No acute cardiopulmonary abnormality. Electronically Signed   By: Rance Burrows M.D.   On: 05/27/2024 19:18     Patient Profile   [name] is a 78 y.o. female with a hx of HFpEF, paroxysmal atrial flutter (s/p PVI in October 2024), OSA, PE, DM 2, asthma who is being seen 05/28/2024 for the evaluation of chest pain at the request of Dr. Mason Sole.     Assessment & Plan   1.Chest pain - unclear etiology - initially a strong pounding feeling in chest possibly suggestive of a symptomatic arrhythmia, however developed a subsequent pressure in the chest. Total symptoms lasted about 2 hours.  - no objective evidence of ischemia by EKG or enzymes - echo LVEF 60-65%, no WMAs, indet diastolic - lexiscan  mild to moderate distal inferior/inferolateral ischemia.   - presented with acute chest pain. Lexiscan  suggestive of ischemia. Clinical presentation would be concerning for new onset angina/unstable angina. - initiate hep gtt, plan for transfer to [facility] for cath. Last dose of xarelto  was  05/28/24 at 5pm.   Informed Consent   Shared Decision Making/Informed Consent The risks [stroke (1 in 1000), death (1 in 1000), kidney failure [usually temporary] (1 in 500), bleeding (1 in 200), allergic reaction [possibly serious] (1 in 200)], benefits (diagnostic support and management of coronary artery disease) and alternatives of a cardiac catheterization were discussed in detail with Ms. Piltz and she is willing to proceed.         2. Afib/aflutter - EKG shows NSR - she is on xarelto  for stroke prevention - hold xarelto  for cath, started on hep gtt.   Discussed with primary team. To transfer to [facility] to medicine team, we will arrange cath for tomorrow.   For questions or updates, please contact [facility] Please consult [email or web address] for contact info under     Signed, Armida Lander, MD  05/29/2024, 9:58 AM

## 2024-05-29 NOTE — Progress Notes (Signed)
 Patient is alert and oriented x4. No new events over the night. Remained NPO after midnight pending stress test today.Denied pain and additional needs.

## 2024-05-29 NOTE — Plan of Care (Incomplete)

## 2024-05-29 NOTE — Progress Notes (Signed)
 PROGRESS NOTE   [name]  ZOX:[id number] DOB: 06-18-1946 DOA: 05/27/2024 PCP: [name], MD   Chief Complaint  Patient presents with   Chest Pain   Level of care: Telemetry Cardiac  Brief Admission History:   78 y.o. female with medical history significant of type 2 diabetes mellitus, GERD, neuropathy, anxiety, atrial fibrillation s/p ablation (09/2023) who presents to the emergency department due to sudden onset of left-sided chest pain which started about 1 hour prior to arrival.  Patient was admitted for evaluation of chest pain and 2D echocardiogram did not demonstrate any acute abnormalities.  Cardiology arranged for stress test done on 6/4 with abnormal results and recommended transfer to [facility] for cath tentatively planned for 05/30/24.     Assessment and Plan:  Chest pain-resolved Abnormal nuclear stress test - discussed with Dr. Amanda Jungling, he requested transfer to [facility] for cath likely on 6/5.  He has stopped rivaroxaban  and started her on IV heparin  infusion.  2D echocardiogram without significant or worrisome findings   Mild hypokalemia Repleted   Type 2 diabetes mellitus with hyperglycemia-improved Hemoglobin A1c on 01/25/2024 was 6.3 Continue ISS and hypoglycemia protocol  CBG (last 3)  Recent Labs    05/29/24 0000 05/29/24 0436 05/29/24 0738  GLUCAP 114* 118* 118*   Persistent atrial fibrillation EKG personally reviewed showed normal sinus rhythm with rate of 82 bpm with borderline prolonged PR interval Xarelto  discontinued - last dose 6/3, started on IV heparin  in preparation for cath on 6/5    GERD (gastroesophageal reflux disease) Continue PPI   Neuropathy Continue gabapentin    Obesity class I (BMI 30.27) Diet and lifestyle modification  DVT prophylaxis: IV heparin   Code Status: Full  Family Communication: bedside update 05/29/24 Disposition: transfer to [facility] for cath    Consultants:  Cardiology   Procedures:  Stress test (nuclear) 05/30/23 at  [facility]  Antimicrobials:    Subjective: Pt reporting that she is chest pain free.   Objective: Vitals:   05/28/24 0343 05/28/24 0800 05/28/24 1214 05/28/24 1936  BP: (!) 137/53 123/84 (!) 131/58 (!) 150/69  Pulse: 72 70 71 70  Resp: 16 17 16 20   Temp: 98.2 F (36.8 C) 98.4 F (36.9 C) 98.6 F (37 C) 98.4 F (36.9 C)  TempSrc: Oral Oral Oral Oral  SpO2: 96% 96% 97% 93%  Weight:      Height:        Intake/Output Summary (Last 24 hours) at 05/29/2024 1024 Last data filed at 05/28/2024 2045 Gross per 24 hour  Intake 480 ml  Output --  Net 480 ml   Filed Weights   05/27/24 1824 05/28/24 0009  Weight: 79.4 kg 80 kg   Examination:  General exam: Appears calm and comfortable, NAD.  Respiratory system: Clear to auscultation. Respiratory effort normal. Cardiovascular system: normal S1 & S2 heard. No JVD, murmurs, rubs, gallops or clicks. No pedal edema. Gastrointestinal system: Abdomen is nondistended, soft and nontender. No organomegaly or masses felt. Normal bowel sounds heard. Central nervous system: Alert and oriented. No focal neurological deficits. Extremities: Symmetric 5 x 5 power. Skin: No rashes, lesions or ulcers. Psychiatry: Judgement and insight appear normal. Mood & affect appropriate.   Data Reviewed: I have personally reviewed following labs and imaging studies  CBC: Recent Labs  Lab 05/27/24 1854 05/28/24 0402 05/29/24 0412  WBC 8.8 7.4 6.0  HGB 14.1 12.6 13.2  HCT 43.0 38.2 39.9  MCV 90.9 89.0 90.7  PLT 274 240 248    Basic Metabolic  Panel: Recent Labs  Lab 05/27/24 1854 05/28/24 0402 05/29/24 0412  NA 137 136 137  K 3.9 3.3* 4.4  CL 101 103 109  CO2 26 24 24   GLUCOSE 159* 101* 118*  BUN 22 20 15   CREATININE 0.94 0.73 0.73  CALCIUM 9.5 9.4 9.4  MG  --  1.9 2.0  PHOS  --  3.6  --     CBG: Recent Labs  Lab 05/28/24 1631 05/28/24 1939 05/29/24 0000 05/29/24 0436 05/29/24 0738  GLUCAP 117* 104* 114* 118* 118*    No results found for  this or any previous visit (from the past 240 hours).   Radiology Studies: NM Myocar Multi W/Spect W/Wall Motion / EF Result Date: 05/29/2024   Findings are consistent with small to moderate distal inferior/inferolateral ischemia. Low to intermediate risk   No ST deviation was noted.   LV perfusion is abnormal.   Left ventricular function is normal. Nuclear stress EF: 81%. The left ventricular ejection fraction is hyperdynamic (>65%). End diastolic cavity size is normal.   Small mild intensity anterior defect most intense in the resting images with normal wall motion consistent with breast attenuation.   ECHOCARDIOGRAM COMPLETE Result Date: 05/28/2024    ECHOCARDIOGRAM REPORT   Patient Name:   [name] Date of Exam: 05/28/2024 Medical Rec #:  [id number]      Height:       64.0 in Accession #:    [id number]     Weight:       176.4 lb Date of Birth:  Mar 10, 1946      BSA:          1.855 m Patient Age:    77 years       BP:           131/58 mmHg Patient Gender: F              HR:           73 bpm. Exam Location:  [facility] Procedure: 2D Echo, Color Doppler and Cardiac Doppler (Both Spectral and Color            Flow Doppler were utilized during procedure). Indications:    chest pain  History:        Patient has no prior history of Echocardiogram examinations.                 Signs/Symptoms:Chest Pain; Risk Factors:Diabetes.  Sonographer:    Jeralene Mom Referring Phys: [id number] OLADAPO ADEFESO IMPRESSIONS  1. Left ventricular ejection fraction, by estimation, is 60 to 65%. The left ventricle has normal function. The left ventricle has no regional wall motion abnormalities. Left ventricular diastolic parameters are indeterminate. Elevated left atrial pressure.  2. Right ventricular systolic function is normal. The right ventricular size is normal. There is normal pulmonary artery systolic pressure.  3. The mitral valve is abnormal. Mild mitral valve regurgitation. No evidence of mitral stenosis.  4. The  aortic valve is tricuspid. Aortic valve regurgitation is not visualized. No aortic stenosis is present.  5. The inferior vena cava is normal in size with greater than 50% respiratory variability, suggesting right atrial pressure of 3 mmHg. FINDINGS  Left Ventricle: Left ventricular ejection fraction, by estimation, is 60 to 65%. The left ventricle has normal function. The left ventricle has no regional wall motion abnormalities. The left ventricular internal cavity size was normal in size. There is  no left ventricular hypertrophy. Left ventricular diastolic parameters are indeterminate. Elevated left  atrial pressure. Right Ventricle: The right ventricular size is normal. Right vetricular wall thickness was not well visualized. Right ventricular systolic function is normal. There is normal pulmonary artery systolic pressure. The tricuspid regurgitant velocity is 2.81 m/s, and with an assumed right atrial pressure of 3 mmHg, the estimated right ventricular systolic pressure is 34.6 mmHg. Left Atrium: Left atrial size was normal in size. Right Atrium: Right atrial size was normal in size. Pericardium: There is no evidence of pericardial effusion. Mitral Valve: The mitral valve is abnormal. Mild mitral valve regurgitation. No evidence of mitral valve stenosis. MV peak gradient, 14.7 mmHg. The mean mitral valve gradient is 8.0 mmHg. Tricuspid Valve: The tricuspid valve is normal in structure. Tricuspid valve regurgitation is not demonstrated. No evidence of tricuspid stenosis. Aortic Valve: The aortic valve is tricuspid. Aortic valve regurgitation is not visualized. No aortic stenosis is present. Aortic valve mean gradient measures 8.3 mmHg. Aortic valve peak gradient measures 14.0 mmHg. Aortic valve area, by VTI measures 2.08  cm. Pulmonic Valve: The pulmonic valve was not well visualized. Pulmonic valve regurgitation is not visualized. No evidence of pulmonic stenosis. Aorta: The aortic root and ascending aorta are  structurally normal, with no evidence of dilitation. Venous: The inferior vena cava is normal in size with greater than 50% respiratory variability, suggesting right atrial pressure of 3 mmHg. IAS/Shunts: No atrial level shunt detected by color flow Doppler.  LEFT VENTRICLE PLAX 2D LVIDd:         3.60 cm   Diastology LVIDs:         2.50 cm   LV e' medial:    5.98 cm/s LV PW:         0.80 cm   LV E/e' medial:  27.1 LV IVS:        0.80 cm   LV e' lateral:   8.38 cm/s LVOT diam:     2.00 cm   LV E/e' lateral: 19.3 LV SV:         90 LV SV Index:   49 LVOT Area:     3.14 cm  RIGHT VENTRICLE RV Basal diam:  3.10 cm RV Mid diam:    2.70 cm RV S prime:     11.30 cm/s LEFT ATRIUM             Index        RIGHT ATRIUM           Index LA diam:        3.30 cm 1.78 cm/m   RA Area:     13.80 cm LA Vol (A2C):   56.4 ml 30.41 ml/m  RA Volume:   31.80 ml  17.15 ml/m LA Vol (A4C):   53.6 ml 28.90 ml/m LA Biplane Vol: 58.0 ml 31.27 ml/m  AORTIC VALVE AV Area (Vmax):    2.35 cm AV Area (Vmean):   2.25 cm AV Area (VTI):     2.08 cm AV Vmax:           187.33 cm/s AV Vmean:          136.333 cm/s AV VTI:            0.434 m AV Peak Grad:      14.0 mmHg AV Mean Grad:      8.3 mmHg LVOT Vmax:         140.00 cm/s LVOT Vmean:        97.700 cm/s LVOT VTI:  0.288 m LVOT/AV VTI ratio: 0.66  AORTA Ao Root diam: 2.80 cm Ao Asc diam:  2.90 cm MITRAL VALVE                TRICUSPID VALVE MV Area (PHT): 4.10 cm     TR Peak grad:   31.6 mmHg MV Area VTI:   2.16 cm     TR Vmax:        281.00 cm/s MV Peak grad:  14.7 mmHg MV Mean grad:  8.0 mmHg     SHUNTS MV Vmax:       1.92 m/s     Systemic VTI:  0.29 m MV Vmean:      131.0 cm/s   Systemic Diam: 2.00 cm MV Decel Time: 185 msec MR Peak grad: 89.1 mmHg MR Vmax:      472.00 cm/s MV E velocity: 162.00 cm/s MV A velocity: 128.00 cm/s MV E/A ratio:  1.27 Armida Lander MD Electronically signed by Armida Lander MD Signature Date/Time: 05/28/2024/4:31:24 PM    Final    DG Chest 2  View Result Date: 05/27/2024 CLINICAL DATA:  cp EXAM: CHEST - 2 VIEW COMPARISON:  None available. FINDINGS: No focal airspace consolidation, pleural effusion, or pneumothorax. No cardiomegaly. Large hiatal hernia containing an air-fluid level. No acute fracture or destructive lesion. Cervical fusion hardware again noted. Cholecystectomy clips. Multilevel thoracic osteophytosis. IMPRESSION: No acute cardiopulmonary abnormality. Electronically Signed   By: Rance Burrows M.D.   On: 05/27/2024 19:18   Scheduled Meds:  aspirin  EC  81 mg Oral Daily   gabapentin   300 mg Oral QHS   insulin  aspart  0-9 Units Subcutaneous Q4H   pantoprazole   40 mg Oral Daily   Continuous Infusions:   LOS: 0 days   Time spent: 55 mins  Toivo Bordon Lincoln Renshaw, MD How to contact the [facility] Attending or Consulting provider 7A - 7P or covering provider during after hours 7P -7A, for this patient?  Check the care team in [facility] and look for a) attending/consulting TRH provider listed and b) the TRH team listed Log into [email or web address] to find provider on call.  Locate the TRH provider you are looking for under [facility] and page to a number that you can be directly reached. If you still have difficulty reaching the provider, please page the [facility] (Director on Call) for the Hospitalists listed on amion for assistance.  05/29/2024, 10:24 AM

## 2024-05-29 NOTE — Progress Notes (Signed)
 PHARMACY - ANTICOAGULATION CONSULT NOTE  Pharmacy Consult for Heparin  Indication: chest pain/ACS  Allergies  Allergen Reactions   Cephalexin Other (See Comments)    Unknown    Nsaids Other (See Comments)    Caused Ulcers.   Darvocet [Propoxyphene N-Acetaminophen ] Nausea Only   Penicillins Rash    Immediate rash, facial/tongue/throat swelling, SOB or lightheadedness with hypotension    Percocet [Oxycodone -Acetaminophen ] Nausea Only    Patient Measurements: Height: 5\' 4"  (162.6 cm) Weight: 80 kg (176 lb 5.9 oz) IBW/kg (Calculated) : 54.7 HEPARIN  DW (KG): 71.7  Vital Signs:    Labs: Recent Labs    05/27/24 1854 05/27/24 2021 05/28/24 0402 05/29/24 0412  HGB 14.1  --  12.6 13.2  HCT 43.0  --  38.2 39.9  PLT 274  --  240 248  CREATININE 0.94  --  0.73 0.73  TROPONINIHS 3 3  --   --     Estimated Creatinine Clearance: 60.2 mL/min (by C-G formula based on SCr of 0.73 mg/dL).   Medical History: Past Medical History:  Diagnosis Date   Anxiety 12/29/2022   Arthritis    Asthma    Carpal tunnel syndrome    Depression    Dyspnea    Dysrhythmia    Gastric ulcer    GERD (gastroesophageal reflux disease)    H/O hiatal hernia    Heart murmur    since birth   History of heart murmur in childhood    History of kidney stones    Hypertension    Iron deficiency anemia    Neuropathy    Following neck surgery.   Paralysis (HCC)    PONV (postoperative nausea and vomiting)    Primary localized osteoarthritis of right knee 02/06/2024   Pulmonary embolus [facility])    November 2020   Type 2 diabetes mellitus (HCC)     Medications:  Medications Prior to Admission  Medication Sig Dispense Refill Last Dose/Taking   acetaminophen  (TYLENOL ) 500 MG tablet Take 1,000 mg by mouth every 6 (six) hours as needed for mild pain (pain score 1-3) or headache.   Past Month   cholecalciferol  (VITAMIN D3) 25 MCG (1000 UNIT) tablet Take 1,000 Units by mouth daily.   05/27/2024 Morning    cyanocobalamin (VITAMIN B12) 1000 MCG tablet Take 1,000 mcg by mouth daily.   05/27/2024 Morning   escitalopram (LEXAPRO) 20 MG tablet Take 20 mg by mouth daily.   05/27/2024 Morning   ferrous sulfate  325 (65 FE) MG tablet Take 1 tablet (325 mg total) by mouth daily with breakfast.   05/27/2024 Morning   furosemide  (LASIX ) 20 MG tablet TAKE 1 TABLET BY MOUTH TWICE DAILY.TAKE AN ADDITIONAL TABLET DAILY AS NEEDED FOR SHORTNESS OF BREATH (Patient taking differently: Take 20 mg by mouth 2 (two) times daily.) 270 tablet 1 05/27/2024 Morning   gabapentin  (NEURONTIN ) 300 MG capsule Take 300 mg by mouth at bedtime.   05/26/2024   KLOR-CON  M20 20 MEQ tablet TAKE 1 TABLET BY MOUTH EVERY DAY 90 tablet 1 05/27/2024 Morning   metFORMIN (GLUCOPHAGE) 500 MG tablet Take 500 mg by mouth 2 (two) times daily with a meal.    05/27/2024 Morning   metoprolol  tartrate (LOPRESSOR ) 25 MG tablet Take 0.5 tablets (12.5 mg total) by mouth as needed (hr over 100 bpm).   Unknown   montelukast  (SINGULAIR ) 10 MG tablet Take 10 mg by mouth every morning.   05/27/2024 Morning   omeprazole  (PRILOSEC) 20 MG capsule Take 20 mg by mouth 2 (two)  times daily before a meal.   05/27/2024 Morning   Phenazopyrid-Cranbry-C-Probiot (AZO URINARY TRACT SUPPORT PO) Take 2 tablets by mouth daily.   05/27/2024 Morning   Probiotic Product (PROBIOTIC DAILY PO) Take 1 capsule by mouth daily.   05/27/2024 Morning   rivaroxaban  (XARELTO ) 20 MG TABS tablet Take 20 mg by mouth daily with supper.   05/26/2024   Turmeric 450 MG CAPS Take 450 mg by mouth daily.   05/27/2024 Morning   zolpidem  (AMBIEN ) 10 MG tablet Take 10 mg by mouth at bedtime.   05/26/2024    Assessment: 78 yo female presented with acute chest pain. Lexiscan  suggestive of ischemia. Clinical presentation concerning for new onset angina/unstable angina. Pharmacy asked to start heparin . Plan for transfer to [facility] for cath. Last dose of xarelto  was 05/28/24 at 5pm.   Baseline aPTT and anti-Xa level now. Likely rivaroxaban   will affect anti-Xa level.  Goal of Therapy:  Heparin  level 0.3-0.7 units/ml aPTT 66-102 seconds Monitor platelets by anticoagulation protocol: Yes   Plan:  Start heparin  infusion at 900 units/hr at 1700 Check aPTT  level in ~8 hours and  aPTT and anti-Xa level daily while on heparin  Continue to monitor H&H and platelets  Lora Robinsons, BS Pharm D, BCPS Clinical Pharmacist 05/29/2024,10:31 AM

## 2024-05-29 NOTE — Plan of Care (Signed)

## 2024-05-29 NOTE — Hospital Course (Signed)
 78 y.o. female with medical history significant of type 2 diabetes mellitus, GERD, neuropathy, anxiety, atrial fibrillation s/p ablation (09/2023) who presents to the emergency department due to sudden onset of left-sided chest pain which started about 1 hour prior to arrival.  Patient was admitted for evaluation of chest pain and 2D echocardiogram did not demonstrate any acute abnormalities.  Cardiology arranged for stress test done on 6/4 with abnormal results and recommended transfer to [facility] for cath tentatively planned for 05/30/24.

## 2024-05-29 NOTE — Progress Notes (Addendum)
   Patient arrived around 4:30 PM on 05/29/2024 from [facility] with plans for cardiac catheterization tomorrow.   She was seen by Dr. Armida Lander today at [facility]. With her abnormal Lexiscan  paired with her chest pain she was started on IV heparin  and transferred for cath.   Upon arrival I have checked in with the patient, she denies any active chest pain and is comfortable. I have reminded her to remain NPO starting at midnight. She verbalized understanding and all questions were answered.   Appropriate cath orders have been placed. Patient NPO at midnight.  Jiles Mote, PA-C 05/29/2024 4:33 PM

## 2024-05-30 ENCOUNTER — Encounter (HOSPITAL_COMMUNITY)
Admission: EM | Disposition: A | Discharge status: Home / Self Care | Payer: Self-pay | Source: Home / Self Care | Attending: Emergency Medicine

## 2024-05-30 ENCOUNTER — Ambulatory Visit (HOSPITAL_COMMUNITY): Admission: RE | Admit: 2024-05-30 | Source: Home / Self Care | Admitting: Internal Medicine

## 2024-05-30 ENCOUNTER — Encounter (HOSPITAL_COMMUNITY): Payer: Self-pay | Admitting: Internal Medicine

## 2024-05-30 ENCOUNTER — Other Ambulatory Visit (HOSPITAL_COMMUNITY): Payer: Self-pay

## 2024-05-30 ENCOUNTER — Telehealth (HOSPITAL_COMMUNITY): Payer: Self-pay | Admitting: Pharmacy Technician

## 2024-05-30 DIAGNOSIS — R079 Chest pain, unspecified: Secondary | ICD-10-CM | POA: Diagnosis not present

## 2024-05-30 DIAGNOSIS — R072 Precordial pain: Secondary | ICD-10-CM | POA: Diagnosis not present

## 2024-05-30 DIAGNOSIS — R9439 Abnormal result of other cardiovascular function study: Secondary | ICD-10-CM

## 2024-05-30 HISTORY — PX: LEFT HEART CATH AND CORONARY ANGIOGRAPHY: CATH118249

## 2024-05-30 LAB — CBC
HCT: 39.9 % (ref 36.0–46.0)
Hemoglobin: 13.2 g/dL (ref 12.0–15.0)
MCH: 30 pg (ref 26.0–34.0)
MCHC: 33.1 g/dL (ref 30.0–36.0)
MCV: 90.7 fL (ref 80.0–100.0)
Platelets: 235 10*3/uL (ref 150–400)
RBC: 4.4 MIL/uL (ref 3.87–5.11)
RDW: 15.2 % (ref 11.5–15.5)
WBC: 8.3 10*3/uL (ref 4.0–10.5)
nRBC: 0 % (ref 0.0–0.2)

## 2024-05-30 LAB — GLUCOSE, CAPILLARY
Glucose-Capillary: 129 mg/dL — ABNORMAL HIGH (ref 70–99)
Glucose-Capillary: 117 mg/dL — ABNORMAL HIGH (ref 70–99)
Glucose-Capillary: 123 mg/dL — ABNORMAL HIGH (ref 70–99)

## 2024-05-30 LAB — APTT: aPTT: 49 s — ABNORMAL HIGH (ref 24–36)

## 2024-05-30 LAB — HEPARIN LEVEL (UNFRACTIONATED)
Heparin Unfractionated: 0.25 [IU]/mL — ABNORMAL LOW (ref 0.30–0.70)
Heparin Unfractionated: 0.31 [IU]/mL (ref 0.30–0.70)

## 2024-05-30 SURGERY — LEFT HEART CATH AND CORONARY ANGIOGRAPHY
Anesthesia: LOCAL

## 2024-05-30 MED ORDER — FENTANYL CITRATE (PF) 100 MCG/2ML IJ SOLN
INTRAMUSCULAR | Status: DC | PRN
  Administered 2024-05-30: 25 ug via INTRAVENOUS

## 2024-05-30 MED ORDER — VERAPAMIL HCL 2.5 MG/ML IV SOLN
INTRAVENOUS | Status: DC | PRN
  Administered 2024-05-30: 10 mL via INTRA_ARTERIAL

## 2024-05-30 MED ORDER — ASPIRIN 81 MG PO TBEC
81.0000 mg | DELAYED_RELEASE_TABLET | Freq: Every day | ORAL | 12 refills | Status: DC
Start: 2024-05-31 — End: 2024-08-02

## 2024-05-30 MED ORDER — HEPARIN (PORCINE) IN NACL 1000-0.9 UT/500ML-% IV SOLN
INTRAVENOUS | Status: DC | PRN
  Administered 2024-05-30: 1000 mL

## 2024-05-30 MED ORDER — SODIUM CHLORIDE 0.9 % IV SOLN: INTRAVENOUS | Status: DC

## 2024-05-30 MED ORDER — HEPARIN SODIUM (PORCINE) 1000 UNIT/ML IJ SOLN
INTRAMUSCULAR | Status: AC
Start: 2024-05-30 — End: ?
  Filled 2024-05-30: qty 10

## 2024-05-30 MED ORDER — SODIUM CHLORIDE 0.9 % IV SOLN: 250.0000 mL | INTRAVENOUS | Status: DC | PRN

## 2024-05-30 MED ORDER — VERAPAMIL HCL 2.5 MG/ML IV SOLN
INTRAVENOUS | Status: AC
  Filled 2024-05-30: qty 2

## 2024-05-30 MED ORDER — HYDRALAZINE HCL 20 MG/ML IJ SOLN: 10.0000 mg | INTRAMUSCULAR | Status: DC | PRN

## 2024-05-30 MED ORDER — SODIUM CHLORIDE 0.9% FLUSH: 3.0000 mL | INTRAVENOUS | Status: DC | PRN

## 2024-05-30 MED ORDER — LIDOCAINE HCL (PF) 1 % IJ SOLN
INTRAMUSCULAR | Status: DC | PRN
  Administered 2024-05-30: 2 mL via SUBCUTANEOUS

## 2024-05-30 MED ORDER — RIVAROXABAN 20 MG PO TABS
20.0000 mg | ORAL_TABLET | Freq: Every day | ORAL | Status: DC
  Administered 2024-05-30: 20 mg via ORAL
  Filled 2024-05-30: qty 1

## 2024-05-30 MED ORDER — MIDAZOLAM HCL 2 MG/2ML IJ SOLN
INTRAMUSCULAR | Status: DC | PRN
  Administered 2024-05-30: 1 mg via INTRAVENOUS

## 2024-05-30 MED ORDER — LABETALOL HCL 5 MG/ML IV SOLN: 10.0000 mg | INTRAVENOUS | Status: DC | PRN

## 2024-05-30 MED ORDER — LIDOCAINE HCL (PF) 1 % IJ SOLN
INTRAMUSCULAR | Status: AC
  Filled 2024-05-30: qty 30

## 2024-05-30 MED ORDER — FENTANYL CITRATE (PF) 100 MCG/2ML IJ SOLN
INTRAMUSCULAR | Status: AC
Start: 2024-05-30 — End: ?
  Filled 2024-05-30: qty 2

## 2024-05-30 MED ORDER — HEPARIN SODIUM (PORCINE) 1000 UNIT/ML IJ SOLN
INTRAMUSCULAR | Status: DC | PRN
  Administered 2024-05-30: 4000 [IU] via INTRAVENOUS

## 2024-05-30 MED ORDER — MIDAZOLAM HCL 2 MG/2ML IJ SOLN
INTRAMUSCULAR | Status: AC
  Filled 2024-05-30: qty 2

## 2024-05-30 MED ORDER — SODIUM CHLORIDE 0.9% FLUSH: 3.0000 mL | Freq: Two times a day (BID) | INTRAVENOUS | Status: DC

## 2024-05-30 MED ORDER — IOHEXOL 350 MG/ML SOLN
INTRAVENOUS | Status: DC | PRN
  Administered 2024-05-30: 30 mL

## 2024-05-30 SURGICAL SUPPLY — 7 items
CATH 5FR JL3.5 JR4 ANG PIG MP (CATHETERS) IMPLANT
DEVICE RAD COMP TR BAND LRG (VASCULAR PRODUCTS) IMPLANT
GLIDESHEATH SLEND SS 6F .021 (SHEATH) IMPLANT
GUIDEWIRE ANGLED .035 180CM (WIRE) IMPLANT
GUIDEWIRE INQWIRE 1.5J.035X260 (WIRE) IMPLANT
PACK CARDIAC CATHETERIZATION (CUSTOM PROCEDURE TRAY) ×1 IMPLANT
SET ATX-X65L (MISCELLANEOUS) IMPLANT

## 2024-05-30 NOTE — Progress Notes (Signed)
  Progress Note  Patient Name: ALPA SALVO Date of Encounter: 05/30/2024 [facility] Cardiologist: Vishnu P Mallipeddi, MD   Interval Summary    No chest pain overnight, planned for cardiac cath today.   Vital Signs Vitals:   05/29/24 1639 05/29/24 2309 05/30/24 0328 05/30/24 0741  BP: (!) 145/71 (!) 144/66 138/64 (!) 140/64  Pulse: 79 76 71 76  Resp: 18 15 18 15   Temp: 98.4 F (36.9 C) 98.5 F (36.9 C) 98 F (36.7 C) 98.4 F (36.9 C)  TempSrc: Oral Oral Oral Oral  SpO2: 97% 96% 95% 92%  Weight:      Height:        Intake/Output Summary (Last 24 hours) at 05/30/2024 0819 Last data filed at 05/30/2024 0631 Gross per 24 hour  Intake 963.06 ml  Output --  Net 963.06 ml      05/28/2024   12:09 AM 05/27/2024    6:24 PM 03/07/2024   11:05 AM  Last 3 Weights  Weight (lbs) 176 lb 5.9 oz 175 lb 187 lb 3.2 oz  Weight (kg) 80 kg 79.379 kg 84.913 kg      Telemetry/ECG  Sinus Rhythm - Personally Reviewed  Physical Exam  GEN: No acute distress.   Neck: No JVD Cardiac: RRR, soft systolic murmur LLSB, no rubs, or gallops.  Respiratory: Clear to auscultation bilaterally. GI: Soft, nontender, non-distended  MS: No edema  Assessment & Plan   [name] is a 78 y.o. female with a hx of HFpEF, paroxysmal atrial flutter (s/p PVI in October 2024), OSA, PE, DM 2, asthma who was seen 05/28/2024 for the evaluation of chest pain at the request of Dr. Mason Sole   Chest pain -- presented with a strong pounding feeling in her chest, could have been arrhythmia? But continued to have persistent chest pressure. hsTn negative x2, EKG without ischemia. Underwent lexiscan  myoview  with mild to moderate distal infero/inferolateral ischemia noted.  -- echo 6/3 with LVEF of 60-65%, rWMA, normal RV, mild MR -- transferred to [facility] for further evaluation with definitive cardiac cath today  Atrial fibrillation/flutter S/p PVI '24 -- remains in sinus rhythm -- Xarelto  held with plans for  cardiac cath, on IV heparin   DM GERD Neuropathy Depression  For questions or updates, please contact [facility] Please consult [email or web address] for contact info under       Signed, Johnie Nailer, NP

## 2024-05-30 NOTE — H&P (View-Only) (Signed)
  Progress Note  Patient Name: [name] Date of Encounter: 05/30/2024 [facility] Cardiologist: Vishnu P Mallipeddi, MD   Interval Summary    No chest pain overnight, planned for cardiac cath today.   Vital Signs Vitals:   05/29/24 1639 05/29/24 2309 05/30/24 0328 05/30/24 0741  BP: (!) 145/71 (!) 144/66 138/64 (!) 140/64  Pulse: 79 76 71 76  Resp: 18 15 18 15   Temp: 98.4 F (36.9 C) 98.5 F (36.9 C) 98 F (36.7 C) 98.4 F (36.9 C)  TempSrc: Oral Oral Oral Oral  SpO2: 97% 96% 95% 92%  Weight:      Height:        Intake/Output Summary (Last 24 hours) at 05/30/2024 0819 Last data filed at 05/30/2024 0631 Gross per 24 hour  Intake 963.06 ml  Output --  Net 963.06 ml      05/28/2024   12:09 AM 05/27/2024    6:24 PM 03/07/2024   11:05 AM  Last 3 Weights  Weight (lbs) 176 lb 5.9 oz 175 lb 187 lb 3.2 oz  Weight (kg) 80 kg 79.379 kg 84.913 kg      Telemetry/ECG  Sinus Rhythm - Personally Reviewed  Physical Exam  GEN: No acute distress.   Neck: No JVD Cardiac: RRR, soft systolic murmur LLSB, no rubs, or gallops.  Respiratory: Clear to auscultation bilaterally. GI: Soft, nontender, non-distended  MS: No edema  Assessment & Plan   [name] is a 78 y.o. female with a hx of HFpEF, paroxysmal atrial flutter (s/p PVI in October 2024), OSA, PE, DM 2, asthma who was seen 05/28/2024 for the evaluation of chest pain at the request of Dr. Mason Sole   Chest pain -- presented with a strong pounding feeling in her chest, could have been arrhythmia? But continued to have persistent chest pressure. hsTn negative x2, EKG without ischemia. Underwent lexiscan  myoview  with mild to moderate distal infero/inferolateral ischemia noted.  -- echo 6/3 with LVEF of 60-65%, rWMA, normal RV, mild MR -- transferred to [facility] for further evaluation with definitive cardiac cath today  Atrial fibrillation/flutter S/p PVI '24 -- remains in sinus rhythm -- Xarelto  held with plans for  cardiac cath, on IV heparin   DM GERD Neuropathy Depression  For questions or updates, please contact [facility] Please consult [email or web address] for contact info under       Signed, Johnie Nailer, NP

## 2024-05-30 NOTE — Plan of Care (Signed)

## 2024-05-30 NOTE — Discharge Instructions (Signed)
 Advised to resume rivaroxaban  from tomorrow. Patient underwent left heart cath which was nonobstructive coronary disease.

## 2024-05-30 NOTE — Telephone Encounter (Signed)
 Patient Product/process development scientist completed.    The patient is insured through [employer]/ [employer]. Patient has Medicare and is not eligible for a copay card, but may be able to apply for patient assistance or Medicare RX Payment Plan (Patient Must reach out to their plan, if eligible for payment plan), if available.    Ran test claim for Farxiga 10 mg and the current 30 day co-pay is $47.00.  Ran test claim for Jardiance 10 mg and the current 30 day co-pay is $47.00.  This test claim was processed through [facility]- copay amounts may vary at other pharmacies due to pharmacy/plan contracts, or as the patient moves through the different stages of their insurance plan.     [name], CPHT Pharmacy Technician III Certified Patient Advocate [facility] Patient Advocate Team Direct Number: [phone]  Fax: [phone]

## 2024-05-30 NOTE — Interval H&P Note (Signed)
 History and Physical Interval Note:  05/30/2024 11:07 AM  Deborra Falter  has presented today for surgery, with the diagnosis of chest pain and abnormal stress test.  The various methods of treatment have been discussed with the patient and family. After consideration of risks, benefits and other options for treatment, the patient has consented to  Procedure(s): LEFT HEART CATH AND CORONARY ANGIOGRAPHY (N/A) as a surgical intervention.  The patient's history has been reviewed, patient examined, no change in status, stable for surgery.  I have reviewed the patient's chart and labs.  Questions were answered to the patient's satisfaction.    Cath Lab Visit (complete for each Cath Lab visit)  Clinical Evaluation Leading to the Procedure:   ACS: No.  Non-ACS:    Anginal Classification: CCS IV  Anti-ischemic medical therapy: Minimal Therapy (1 class of medications)  Non-Invasive Test Results: Intermediate-risk stress test findings: cardiac mortality 1-3%/year  Prior CABG: No previous CABG  Percell Lamboy

## 2024-05-30 NOTE — Discharge Summary (Signed)
 Physician Discharge Summary  [name] WJX:[id number] DOB: 1946-04-30 DOA: 05/27/2024  PCP: [name], MD  Admit date: 05/27/2024  Discharge date: 05/30/2024  Admitted From: Home  Disposition:  Home.  Recommendations for Outpatient Follow-up:  Follow up with PCP in 1-2 weeks. Please obtain BMP/CBC in one week. Advised to resume rivaroxaban  from tomorrow. Patient underwent left heart cath which was nonobstructive coronary disease.  Home Health:None Equipment/Devices:None  Discharge Condition: Stable CODE STATUS:Full code Diet recommendation: Heart Healthy  Brief [facility] Course: This 78 y.o. female with medical history significant of type 2 diabetes mellitus, GERD, neuropathy, anxiety, atrial fibrillation s/p ablation (09/2023) who presents to the emergency department due to sudden onset of left-sided chest pain which started about 1 hour prior to arrival.  Patient was admitted for evaluation of chest pain and 2D echocardiogram did not demonstrate any acute abnormalities.  Cardiology arranged for stress test done on 6/4 with abnormal results and recommended transfer to [facility] for cath tentatively planned for 05/30/24.  Patient underwent left heart cath found to have nonobstructive coronary artery. Cardiology signed off.  Patient can be discharged if she feels better post cath.  Patient feels improved and wants to be discharged home.  Discharge Diagnoses:  Principal Problem:   Chest pain Active Problems:   Neuropathy   GERD (gastroesophageal reflux disease)   Type 2 diabetes mellitus with hyperglycemia (HCC)   Persistent atrial fibrillation (HCC)   Obesity, Class I, BMI 30-34.9    Discharge Instructions  Discharge Instructions     Call MD for:  difficulty breathing, headache or visual disturbances   Complete by: As directed    Call MD for:  persistant dizziness or light-headedness   Complete by: As directed    Call MD for:  persistant nausea and vomiting   Complete  by: As directed    Diet - low sodium heart healthy   Complete by: As directed    Diet Carb Modified   Complete by: As directed    Discharge instructions   Complete by: As directed    Advised to follow-up with primary care physician in 1 week. Advised to resume rivaroxaban  from tomorrow. Patient underwent left heart cath which was nonobstructive coronary disease.   Increase activity slowly   Complete by: As directed       Allergies as of 05/30/2024       Reactions   Cephalexin Other (See Comments)   Unknown    Nsaids Other (See Comments)   Caused Ulcers.   Darvocet [propoxyphene N-acetaminophen ] Nausea Only   Penicillins Rash   Immediate rash, facial/tongue/throat swelling, SOB or lightheadedness with hypotension   Percocet [oxycodone -acetaminophen ] Nausea Only        Medication List     TAKE these medications    acetaminophen  500 MG tablet Commonly known as: TYLENOL  Take 1,000 mg by mouth every 6 (six) hours as needed for mild pain (pain score 1-3) or headache.   aspirin  EC 81 MG tablet Take 1 tablet (81 mg total) by mouth daily. Swallow whole. Start taking on: May 31, 2024   AZO URINARY TRACT SUPPORT PO Take 2 tablets by mouth daily.   cholecalciferol  25 MCG (1000 UNIT) tablet Commonly known as: VITAMIN D3 Take 1,000 Units by mouth daily.   cyanocobalamin 1000 MCG tablet Commonly known as: VITAMIN B12 Take 1,000 mcg by mouth daily.   escitalopram 20 MG tablet Commonly known as: LEXAPRO Take 20 mg by mouth daily.   ferrous sulfate  325 (65 FE) MG  tablet Take 1 tablet (325 mg total) by mouth daily with breakfast.   furosemide  20 MG tablet Commonly known as: LASIX  TAKE 1 TABLET BY MOUTH TWICE DAILY.TAKE AN ADDITIONAL TABLET DAILY AS NEEDED FOR SHORTNESS OF BREATH What changed: See the new instructions.   gabapentin  300 MG capsule Commonly known as: NEURONTIN  Take 300 mg by mouth at bedtime.   Klor-Con  M20 20 MEQ tablet Generic drug: potassium  chloride SA TAKE 1 TABLET BY MOUTH EVERY DAY   metFORMIN 500 MG tablet Commonly known as: GLUCOPHAGE Take 500 mg by mouth 2 (two) times daily with a meal.   metoprolol  tartrate 25 MG tablet Commonly known as: LOPRESSOR  Take 0.5 tablets (12.5 mg total) by mouth as needed (hr over 100 bpm).   montelukast  10 MG tablet Commonly known as: SINGULAIR  Take 10 mg by mouth every morning.   omeprazole  20 MG capsule Commonly known as: PRILOSEC Take 20 mg by mouth 2 (two) times daily before a meal.   PROBIOTIC DAILY PO Take 1 capsule by mouth daily.   rivaroxaban  20 MG Tabs tablet Commonly known as: XARELTO  Take 20 mg by mouth daily with supper.   Turmeric 450 MG Caps Take 450 mg by mouth daily.   zolpidem  10 MG tablet Commonly known as: AMBIEN  Take 10 mg by mouth at bedtime.        Follow-up Information     [name], MD Follow up in 1 week(s).   Specialty: Family Medicine Contact information: [address] [place] [address] [phone]                Allergies  Allergen Reactions   Cephalexin Other (See Comments)    Unknown    Nsaids Other (See Comments)    Caused Ulcers.   Darvocet [Propoxyphene N-Acetaminophen ] Nausea Only   Penicillins Rash    Immediate rash, facial/tongue/throat swelling, SOB or lightheadedness with hypotension    Percocet [Oxycodone -Acetaminophen ] Nausea Only    Consultations: Cardiology   Procedures/Studies: CARDIAC CATHETERIZATION Result Date: 05/30/2024 Conclusions: No angiographically significant coronary artery disease. Normal left ventricular filling pressure. Recommendations: Primary prevention of coronary artery disease. If no evidence of bleeding or vascular injury at right radial arteriotomy site, rivaroxaban  can be restarted this evening. Sammy Crisp, MD [facility]  NM Myocar Multi W/Spect [facility] Motion / EF Result Date: 05/29/2024   Findings are consistent with small to moderate distal  inferior/inferolateral ischemia. Low to intermediate risk   No ST deviation was noted.   LV perfusion is abnormal.   Left ventricular function is normal. Nuclear stress EF: 81%. The left ventricular ejection fraction is hyperdynamic (>65%). End diastolic cavity size is normal.   Small mild intensity anterior defect most intense in the resting images with normal wall motion consistent with breast attenuation.   ECHOCARDIOGRAM COMPLETE Result Date: 05/28/2024    ECHOCARDIOGRAM REPORT   Patient Name:   [name] Date of Exam: 05/28/2024 Medical Rec #:  [id number]      Height:       64.0 in Accession #:    [id number]     Weight:       176.4 lb Date of Birth:  05/07/1946      BSA:          1.855 m Patient Age:    77 years       BP:           131/58 mmHg Patient Gender: F  HR:           73 bpm. Exam Location:  [facility] Procedure: 2D Echo, Color Doppler and Cardiac Doppler (Both Spectral and Color            Flow Doppler were utilized during procedure). Indications:    chest pain  History:        Patient has no prior history of Echocardiogram examinations.                 Signs/Symptoms:Chest Pain; Risk Factors:Diabetes.  Sonographer:    Jeralene Mom Referring Phys: [id number] OLADAPO ADEFESO IMPRESSIONS  1. Left ventricular ejection fraction, by estimation, is 60 to 65%. The left ventricle has normal function. The left ventricle has no regional wall motion abnormalities. Left ventricular diastolic parameters are indeterminate. Elevated left atrial pressure.  2. Right ventricular systolic function is normal. The right ventricular size is normal. There is normal pulmonary artery systolic pressure.  3. The mitral valve is abnormal. Mild mitral valve regurgitation. No evidence of mitral stenosis.  4. The aortic valve is tricuspid. Aortic valve regurgitation is not visualized. No aortic stenosis is present.  5. The inferior vena cava is normal in size with greater than 50% respiratory variability,  suggesting right atrial pressure of 3 mmHg. FINDINGS  Left Ventricle: Left ventricular ejection fraction, by estimation, is 60 to 65%. The left ventricle has normal function. The left ventricle has no regional wall motion abnormalities. The left ventricular internal cavity size was normal in size. There is  no left ventricular hypertrophy. Left ventricular diastolic parameters are indeterminate. Elevated left atrial pressure. Right Ventricle: The right ventricular size is normal. Right vetricular wall thickness was not well visualized. Right ventricular systolic function is normal. There is normal pulmonary artery systolic pressure. The tricuspid regurgitant velocity is 2.81 m/s, and with an assumed right atrial pressure of 3 mmHg, the estimated right ventricular systolic pressure is 34.6 mmHg. Left Atrium: Left atrial size was normal in size. Right Atrium: Right atrial size was normal in size. Pericardium: There is no evidence of pericardial effusion. Mitral Valve: The mitral valve is abnormal. Mild mitral valve regurgitation. No evidence of mitral valve stenosis. MV peak gradient, 14.7 mmHg. The mean mitral valve gradient is 8.0 mmHg. Tricuspid Valve: The tricuspid valve is normal in structure. Tricuspid valve regurgitation is not demonstrated. No evidence of tricuspid stenosis. Aortic Valve: The aortic valve is tricuspid. Aortic valve regurgitation is not visualized. No aortic stenosis is present. Aortic valve mean gradient measures 8.3 mmHg. Aortic valve peak gradient measures 14.0 mmHg. Aortic valve area, by VTI measures 2.08  cm. Pulmonic Valve: The pulmonic valve was not well visualized. Pulmonic valve regurgitation is not visualized. No evidence of pulmonic stenosis. Aorta: The aortic root and ascending aorta are structurally normal, with no evidence of dilitation. Venous: The inferior vena cava is normal in size with greater than 50% respiratory variability, suggesting right atrial pressure of 3 mmHg.  IAS/Shunts: No atrial level shunt detected by color flow Doppler.  LEFT VENTRICLE PLAX 2D LVIDd:         3.60 cm   Diastology LVIDs:         2.50 cm   LV e' medial:    5.98 cm/s LV PW:         0.80 cm   LV E/e' medial:  27.1 LV IVS:        0.80 cm   LV e' lateral:   8.38 cm/s LVOT diam:  2.00 cm   LV E/e' lateral: 19.3 LV SV:         90 LV SV Index:   49 LVOT Area:     3.14 cm  RIGHT VENTRICLE RV Basal diam:  3.10 cm RV Mid diam:    2.70 cm RV S prime:     11.30 cm/s LEFT ATRIUM             Index        RIGHT ATRIUM           Index LA diam:        3.30 cm 1.78 cm/m   RA Area:     13.80 cm LA Vol (A2C):   56.4 ml 30.41 ml/m  RA Volume:   31.80 ml  17.15 ml/m LA Vol (A4C):   53.6 ml 28.90 ml/m LA Biplane Vol: 58.0 ml 31.27 ml/m  AORTIC VALVE AV Area (Vmax):    2.35 cm AV Area (Vmean):   2.25 cm AV Area (VTI):     2.08 cm AV Vmax:           187.33 cm/s AV Vmean:          136.333 cm/s AV VTI:            0.434 m AV Peak Grad:      14.0 mmHg AV Mean Grad:      8.3 mmHg LVOT Vmax:         140.00 cm/s LVOT Vmean:        97.700 cm/s LVOT VTI:          0.288 m LVOT/AV VTI ratio: 0.66  AORTA Ao Root diam: 2.80 cm Ao Asc diam:  2.90 cm MITRAL VALVE                TRICUSPID VALVE MV Area (PHT): 4.10 cm     TR Peak grad:   31.6 mmHg MV Area VTI:   2.16 cm     TR Vmax:        281.00 cm/s MV Peak grad:  14.7 mmHg MV Mean grad:  8.0 mmHg     SHUNTS MV Vmax:       1.92 m/s     Systemic VTI:  0.29 m MV Vmean:      131.0 cm/s   Systemic Diam: 2.00 cm MV Decel Time: 185 msec MR Peak grad: 89.1 mmHg MR Vmax:      472.00 cm/s MV E velocity: 162.00 cm/s MV A velocity: 128.00 cm/s MV E/A ratio:  1.27 Armida Lander MD Electronically signed by Armida Lander MD Signature Date/Time: 05/28/2024/4:31:24 PM    Final    DG Chest 2 View Result Date: 05/27/2024 CLINICAL DATA:  cp EXAM: CHEST - 2 VIEW COMPARISON:  None available. FINDINGS: No focal airspace consolidation, pleural effusion, or pneumothorax. No cardiomegaly. Large  hiatal hernia containing an air-fluid level. No acute fracture or destructive lesion. Cervical fusion hardware again noted. Cholecystectomy clips. Multilevel thoracic osteophytosis. IMPRESSION: No acute cardiopulmonary abnormality. Electronically Signed   By: Rance Burrows M.D.   On: 05/27/2024 19:18   LHC     Subjective: Patient was seen and examined at bedside.  Overnight events noted.   Patient reports feeling much improved and wants to be discharged. S/p LHC : non obstructive CAD.  Discharge Exam: Vitals:   05/30/24 1330 05/30/24 1345  BP: 119/68 134/63  Pulse: 76 83  Resp: 17 14  Temp:    SpO2: 96% 96%   Vitals:  05/30/24 1245 05/30/24 1315 05/30/24 1330 05/30/24 1345  BP: 125/72 (!) 128/58 119/68 134/63  Pulse: 76 70 76 83  Resp: 15 19 17 14   Temp:      TempSrc:      SpO2: 94% 95% 96% 96%  Weight:      Height:        General: Pt is alert, awake, not in acute distress Cardiovascular: RRR, S1/S2 +, no rubs, no gallops Respiratory: CTA bilaterally, no wheezing, no rhonchi Abdominal: Soft, NT, ND, bowel sounds + Extremities: no edema, no cyanosis    The results of significant diagnostics from this hospitalization (including imaging, microbiology, ancillary and laboratory) are listed below for reference.     Microbiology: No results found for this or any previous visit (from the past 240 hours).   Labs: BNP (last 3 results) Recent Labs    08/31/23 1551  BNP 35.8   Basic Metabolic Panel: Recent Labs  Lab 05/27/24 1854 05/28/24 0402 05/29/24 0412  NA 137 136 137  K 3.9 3.3* 4.4  CL 101 103 109  CO2 26 24 24   GLUCOSE 159* 101* 118*  BUN 22 20 15   CREATININE 0.94 0.73 0.73  CALCIUM 9.5 9.4 9.4  MG  --  1.9 2.0  PHOS  --  3.6  --    Liver Function Tests: Recent Labs  Lab 05/28/24 0402 05/29/24 0412  AST 21 27  ALT 30 35  ALKPHOS 72 72  BILITOT 0.7 0.7  PROT 6.1* 5.9*  ALBUMIN 3.7 3.4*   No results for input(s): "LIPASE", "AMYLASE" in the  last 168 hours. No results for input(s): "AMMONIA" in the last 168 hours. CBC: Recent Labs  Lab 05/27/24 1854 05/28/24 0402 05/29/24 0412 05/30/24 0118  WBC 8.8 7.4 6.0 8.3  HGB 14.1 12.6 13.2 13.2  HCT 43.0 38.2 39.9 39.9  MCV 90.9 89.0 90.7 90.7  PLT 274 240 248 235   Cardiac Enzymes: No results for input(s): "CKTOTAL", "CKMB", "CKMBINDEX", "TROPONINI" in the last 168 hours. BNP: Invalid input(s): "POCBNP" CBG: Recent Labs  Lab 05/29/24 1643 05/29/24 2115 05/30/24 0606 05/30/24 0831 05/30/24 1235  GLUCAP 107* 146* 129* 117* 123*   D-Dimer No results for input(s): "DDIMER" in the last 72 hours. Hgb A1c No results for input(s): "HGBA1C" in the last 72 hours. Lipid Profile No results for input(s): "CHOL", "HDL", "LDLCALC", "TRIG", "CHOLHDL", "LDLDIRECT" in the last 72 hours. Thyroid  function studies No results for input(s): "TSH", "T4TOTAL", "T3FREE", "THYROIDAB" in the last 72 hours.  Invalid input(s): "FREET3" Anemia work up No results for input(s): "VITAMINB12", "FOLATE", "FERRITIN", "TIBC", "IRON", "RETICCTPCT" in the last 72 hours. Urinalysis    Component Value Date/Time   COLORURINE YELLOW 03/13/2023 1950   APPEARANCEUR Clear 05/13/2024 1131   LABSPEC 1.016 03/13/2023 1950   PHURINE 5.0 03/13/2023 1950   GLUCOSEU Negative 05/13/2024 1131   HGBUR NEGATIVE 03/13/2023 1950   BILIRUBINUR Negative 05/13/2024 1131   KETONESUR NEGATIVE 03/13/2023 1950   PROTEINUR Negative 05/13/2024 1131   PROTEINUR NEGATIVE 03/13/2023 1950   UROBILINOGEN 0.2 10/15/2022 0836   UROBILINOGEN 0.2 05/21/2013 2133   NITRITE Negative 05/13/2024 1131   NITRITE NEGATIVE 03/13/2023 1950   LEUKOCYTESUR Trace (A) 05/13/2024 1131   LEUKOCYTESUR NEGATIVE 03/13/2023 1950   Sepsis Labs Recent Labs  Lab 05/27/24 1854 05/28/24 0402 05/29/24 0412 05/30/24 0118  WBC 8.8 7.4 6.0 8.3   Microbiology No results found for this or any previous visit (from the past 240 hours).   Time  coordinating  discharge: Over 30 minutes  SIGNED:   Magdalene School, MD  [facility] 05/30/2024, 2:18 PM Pager   If 7PM-7AM, please contact night-coverage

## 2024-05-30 NOTE — Progress Notes (Signed)
 PHARMACY - ANTICOAGULATION CONSULT NOTE  Pharmacy Consult for Heparin  Indication: chest pain/ACS  Allergies  Allergen Reactions   Cephalexin Other (See Comments)    Unknown    Nsaids Other (See Comments)    Caused Ulcers.   Darvocet [Propoxyphene N-Acetaminophen ] Nausea Only   Penicillins Rash    Immediate rash, facial/tongue/throat swelling, SOB or lightheadedness with hypotension    Percocet [Oxycodone -Acetaminophen ] Nausea Only    Patient Measurements: Height: 5\' 4"  (162.6 cm) Weight: 80 kg (176 lb 5.9 oz) IBW/kg (Calculated) : 54.7 HEPARIN  DW (KG): 71.7  Vital Signs: Temp: 98.5 F (36.9 C) (06/04 2309) Temp Source: Oral (06/04 2309) BP: 144/66 (06/04 2309) Pulse Rate: 76 (06/04 2309)  Labs: Recent Labs    05/27/24 1854 05/27/24 2021 05/28/24 0402 05/29/24 0412 05/29/24 1118 05/30/24 0118  HGB 14.1  --  12.6 13.2  --  13.2  HCT 43.0  --  38.2 39.9  --  39.9  PLT 274  --  240 248  --  235  APTT  --   --   --   --  29 49*  HEPARINUNFRC  --   --   --   --  1.10* 0.25*  CREATININE 0.94  --  0.73 0.73  --   --   TROPONINIHS 3 3  --   --   --   --     Estimated Creatinine Clearance: 60.2 mL/min (by C-G formula based on SCr of 0.73 mg/dL).   Medical History: Past Medical History:  Diagnosis Date   Anxiety 12/29/2022   Arthritis    Asthma    Carpal tunnel syndrome    Depression    Dyspnea    Dysrhythmia    Gastric ulcer    GERD (gastroesophageal reflux disease)    H/O hiatal hernia    Heart murmur    since birth   History of heart murmur in childhood    History of kidney stones    Hypertension    Iron deficiency anemia    Neuropathy    Following neck surgery.   Paralysis (HCC)    PONV (postoperative nausea and vomiting)    Primary localized osteoarthritis of right knee 02/06/2024   Pulmonary embolus [facility])    November 2020   Type 2 diabetes mellitus (HCC)     Medications:  Medications Prior to Admission  Medication Sig Dispense Refill Last  Dose/Taking   acetaminophen  (TYLENOL ) 500 MG tablet Take 1,000 mg by mouth every 6 (six) hours as needed for mild pain (pain score 1-3) or headache.   Past Month   cholecalciferol  (VITAMIN D3) 25 MCG (1000 UNIT) tablet Take 1,000 Units by mouth daily.   05/27/2024 Morning   cyanocobalamin (VITAMIN B12) 1000 MCG tablet Take 1,000 mcg by mouth daily.   05/27/2024 Morning   escitalopram (LEXAPRO) 20 MG tablet Take 20 mg by mouth daily.   05/27/2024 Morning   ferrous sulfate  325 (65 FE) MG tablet Take 1 tablet (325 mg total) by mouth daily with breakfast.   05/27/2024 Morning   furosemide  (LASIX ) 20 MG tablet TAKE 1 TABLET BY MOUTH TWICE DAILY.TAKE AN ADDITIONAL TABLET DAILY AS NEEDED FOR SHORTNESS OF BREATH (Patient taking differently: Take 20 mg by mouth 2 (two) times daily.) 270 tablet 1 05/27/2024 Morning   gabapentin  (NEURONTIN ) 300 MG capsule Take 300 mg by mouth at bedtime.   05/26/2024   KLOR-CON  M20 20 MEQ tablet TAKE 1 TABLET BY MOUTH EVERY DAY 90 tablet 1 05/27/2024 Morning  metFORMIN (GLUCOPHAGE) 500 MG tablet Take 500 mg by mouth 2 (two) times daily with a meal.    05/27/2024 Morning   metoprolol  tartrate (LOPRESSOR ) 25 MG tablet Take 0.5 tablets (12.5 mg total) by mouth as needed (hr over 100 bpm).   Unknown   montelukast  (SINGULAIR ) 10 MG tablet Take 10 mg by mouth every morning.   05/27/2024 Morning   omeprazole  (PRILOSEC) 20 MG capsule Take 20 mg by mouth 2 (two) times daily before a meal.   05/27/2024 Morning   Phenazopyrid-Cranbry-C-Probiot (AZO URINARY TRACT SUPPORT PO) Take 2 tablets by mouth daily.   05/27/2024 Morning   Probiotic Product (PROBIOTIC DAILY PO) Take 1 capsule by mouth daily.   05/27/2024 Morning   rivaroxaban  (XARELTO ) 20 MG TABS tablet Take 20 mg by mouth daily with supper.   05/26/2024   Turmeric 450 MG CAPS Take 450 mg by mouth daily.   05/27/2024 Morning   zolpidem  (AMBIEN ) 10 MG tablet Take 10 mg by mouth at bedtime.   05/26/2024    Assessment: 78 yo female presented with acute chest  pain. Lexiscan  suggestive of ischemia. Clinical presentation concerning for new onset angina/unstable angina. Pharmacy asked to start heparin . Plan for transfer to [facility] for cath. Last dose of xarelto  was 05/28/24 at 5pm.   Baseline aPTT and anti-Xa level now. Likely rivaroxaban  will affect anti-Xa level.  6/5 AM update: HL: 0.25 aPTT: 49 seconds Hgb / PLT wnl No signs of bleeding per nursing. Patient stated the pump was beeping / not running for an unknown amount of time before midnight  Goal of Therapy:  Heparin  level 0.3-0.7 units/ml aPTT 66-102 seconds Monitor platelets by anticoagulation protocol: Yes   Plan:  Considering the infusion was not running for an unknown amount of time and the HL is just below the lower limit of the therapeutic range, I will continue the heparin  infusion as follows: Continue heparin  infusion at 900 units/hr Re-draw HL on 6/5 at 0700 DC aPTT levels since correlates w/ HL Heparin  level daily while on heparin  Continue to monitor H&H and platelets    Corky Blumstein BS, PharmD, BCPS Clinical Pharmacist 05/30/2024 1:36 AM  Contact: [phone] after 3 PM  "Be curious, not judgmental..." -Rumalda Counter

## 2024-06-11 DIAGNOSIS — R0789 Other chest pain: Secondary | ICD-10-CM | POA: Diagnosis not present

## 2024-06-11 DIAGNOSIS — E6609 Other obesity due to excess calories: Secondary | ICD-10-CM | POA: Diagnosis not present

## 2024-06-11 DIAGNOSIS — Z6831 Body mass index (BMI) 31.0-31.9, adult: Secondary | ICD-10-CM | POA: Diagnosis not present

## 2024-06-11 DIAGNOSIS — I480 Paroxysmal atrial fibrillation: Secondary | ICD-10-CM | POA: Diagnosis not present

## 2024-06-29 ENCOUNTER — Other Ambulatory Visit: Payer: Self-pay | Admitting: Internal Medicine

## 2024-07-11 DIAGNOSIS — L570 Actinic keratosis: Secondary | ICD-10-CM | POA: Diagnosis not present

## 2024-07-11 DIAGNOSIS — L821 Other seborrheic keratosis: Secondary | ICD-10-CM | POA: Diagnosis not present

## 2024-07-11 DIAGNOSIS — D235 Other benign neoplasm of skin of trunk: Secondary | ICD-10-CM | POA: Diagnosis not present

## 2024-07-11 DIAGNOSIS — X32XXXD Exposure to sunlight, subsequent encounter: Secondary | ICD-10-CM | POA: Diagnosis not present

## 2024-07-18 DIAGNOSIS — H52203 Unspecified astigmatism, bilateral: Secondary | ICD-10-CM | POA: Diagnosis not present

## 2024-07-18 DIAGNOSIS — H04123 Dry eye syndrome of bilateral lacrimal glands: Secondary | ICD-10-CM | POA: Diagnosis not present

## 2024-07-18 DIAGNOSIS — Z961 Presence of intraocular lens: Secondary | ICD-10-CM | POA: Diagnosis not present

## 2024-07-30 ENCOUNTER — Inpatient Hospital Stay (HOSPITAL_COMMUNITY)
Admission: EM | Admit: 2024-07-30 | Discharge: 2024-08-02 | DRG: 378 | Disposition: A | Discharge status: Home / Self Care | Attending: Internal Medicine | Admitting: Internal Medicine

## 2024-07-30 ENCOUNTER — Other Ambulatory Visit: Payer: Self-pay

## 2024-07-30 ENCOUNTER — Encounter (HOSPITAL_COMMUNITY): Payer: Self-pay | Admitting: Emergency Medicine

## 2024-07-30 ENCOUNTER — Emergency Department (HOSPITAL_COMMUNITY)

## 2024-07-30 DIAGNOSIS — K449 Diaphragmatic hernia without obstruction or gangrene: Secondary | ICD-10-CM | POA: Diagnosis present

## 2024-07-30 DIAGNOSIS — J45909 Unspecified asthma, uncomplicated: Secondary | ICD-10-CM | POA: Diagnosis not present

## 2024-07-30 DIAGNOSIS — Z8249 Family history of ischemic heart disease and other diseases of the circulatory system: Secondary | ICD-10-CM

## 2024-07-30 DIAGNOSIS — I110 Hypertensive heart disease with heart failure: Secondary | ICD-10-CM | POA: Diagnosis present

## 2024-07-30 DIAGNOSIS — Z880 Allergy status to penicillin: Secondary | ICD-10-CM

## 2024-07-30 DIAGNOSIS — Z820 Family history of epilepsy and other diseases of the nervous system: Secondary | ICD-10-CM | POA: Diagnosis not present

## 2024-07-30 DIAGNOSIS — K922 Gastrointestinal hemorrhage, unspecified: Principal | ICD-10-CM | POA: Diagnosis present

## 2024-07-30 DIAGNOSIS — K317 Polyp of stomach and duodenum: Secondary | ICD-10-CM | POA: Diagnosis not present

## 2024-07-30 DIAGNOSIS — K625 Hemorrhage of anus and rectum: Secondary | ICD-10-CM | POA: Diagnosis not present

## 2024-07-30 DIAGNOSIS — Z886 Allergy status to analgesic agent status: Secondary | ICD-10-CM

## 2024-07-30 DIAGNOSIS — I5032 Chronic diastolic (congestive) heart failure: Secondary | ICD-10-CM

## 2024-07-30 DIAGNOSIS — K254 Chronic or unspecified gastric ulcer with hemorrhage: Secondary | ICD-10-CM | POA: Diagnosis not present

## 2024-07-30 DIAGNOSIS — K2990 Gastroduodenitis, unspecified, without bleeding: Secondary | ICD-10-CM | POA: Diagnosis not present

## 2024-07-30 DIAGNOSIS — K644 Residual hemorrhoidal skin tags: Secondary | ICD-10-CM | POA: Diagnosis present

## 2024-07-30 DIAGNOSIS — E118 Type 2 diabetes mellitus with unspecified complications: Secondary | ICD-10-CM

## 2024-07-30 DIAGNOSIS — K648 Other hemorrhoids: Secondary | ICD-10-CM | POA: Diagnosis not present

## 2024-07-30 DIAGNOSIS — R58 Hemorrhage, not elsewhere classified: Secondary | ICD-10-CM | POA: Diagnosis not present

## 2024-07-30 DIAGNOSIS — Z841 Family history of disorders of kidney and ureter: Secondary | ICD-10-CM | POA: Diagnosis not present

## 2024-07-30 DIAGNOSIS — Z885 Allergy status to narcotic agent status: Secondary | ICD-10-CM

## 2024-07-30 DIAGNOSIS — D6832 Hemorrhagic disorder due to extrinsic circulating anticoagulants: Secondary | ICD-10-CM | POA: Diagnosis present

## 2024-07-30 DIAGNOSIS — K5730 Diverticulosis of large intestine without perforation or abscess without bleeding: Secondary | ICD-10-CM | POA: Diagnosis not present

## 2024-07-30 DIAGNOSIS — Z7901 Long term (current) use of anticoagulants: Secondary | ICD-10-CM

## 2024-07-30 DIAGNOSIS — I10 Essential (primary) hypertension: Secondary | ICD-10-CM | POA: Diagnosis not present

## 2024-07-30 DIAGNOSIS — T45515A Adverse effect of anticoagulants, initial encounter: Secondary | ICD-10-CM | POA: Diagnosis present

## 2024-07-30 DIAGNOSIS — I480 Paroxysmal atrial fibrillation: Secondary | ICD-10-CM | POA: Diagnosis present

## 2024-07-30 DIAGNOSIS — Z833 Family history of diabetes mellitus: Secondary | ICD-10-CM

## 2024-07-30 DIAGNOSIS — R1084 Generalized abdominal pain: Secondary | ICD-10-CM | POA: Diagnosis not present

## 2024-07-30 DIAGNOSIS — D649 Anemia, unspecified: Secondary | ICD-10-CM

## 2024-07-30 DIAGNOSIS — K31811 Angiodysplasia of stomach and duodenum with bleeding: Secondary | ICD-10-CM | POA: Diagnosis not present

## 2024-07-30 DIAGNOSIS — R0602 Shortness of breath: Secondary | ICD-10-CM | POA: Diagnosis not present

## 2024-07-30 DIAGNOSIS — Z7984 Long term (current) use of oral hypoglycemic drugs: Secondary | ICD-10-CM

## 2024-07-30 DIAGNOSIS — Z87442 Personal history of urinary calculi: Secondary | ICD-10-CM

## 2024-07-30 DIAGNOSIS — K31819 Angiodysplasia of stomach and duodenum without bleeding: Secondary | ICD-10-CM | POA: Diagnosis not present

## 2024-07-30 DIAGNOSIS — K3189 Other diseases of stomach and duodenum: Secondary | ICD-10-CM | POA: Diagnosis present

## 2024-07-30 DIAGNOSIS — K2951 Unspecified chronic gastritis with bleeding: Secondary | ICD-10-CM | POA: Diagnosis not present

## 2024-07-30 DIAGNOSIS — N200 Calculus of kidney: Secondary | ICD-10-CM | POA: Diagnosis not present

## 2024-07-30 DIAGNOSIS — K219 Gastro-esophageal reflux disease without esophagitis: Secondary | ICD-10-CM | POA: Diagnosis present

## 2024-07-30 DIAGNOSIS — M1711 Unilateral primary osteoarthritis, right knee: Secondary | ICD-10-CM | POA: Diagnosis present

## 2024-07-30 DIAGNOSIS — Z96653 Presence of artificial knee joint, bilateral: Secondary | ICD-10-CM | POA: Diagnosis present

## 2024-07-30 DIAGNOSIS — R231 Pallor: Secondary | ICD-10-CM | POA: Diagnosis not present

## 2024-07-30 DIAGNOSIS — Z7982 Long term (current) use of aspirin: Secondary | ICD-10-CM

## 2024-07-30 DIAGNOSIS — Z86711 Personal history of pulmonary embolism: Secondary | ICD-10-CM

## 2024-07-30 DIAGNOSIS — D62 Acute posthemorrhagic anemia: Secondary | ICD-10-CM | POA: Diagnosis present

## 2024-07-30 DIAGNOSIS — Z881 Allergy status to other antibiotic agents status: Secondary | ICD-10-CM

## 2024-07-30 DIAGNOSIS — Z79899 Other long term (current) drug therapy: Secondary | ICD-10-CM | POA: Diagnosis not present

## 2024-07-30 DIAGNOSIS — Z806 Family history of leukemia: Secondary | ICD-10-CM

## 2024-07-30 DIAGNOSIS — R531 Weakness: Secondary | ICD-10-CM | POA: Diagnosis not present

## 2024-07-30 DIAGNOSIS — I4891 Unspecified atrial fibrillation: Secondary | ICD-10-CM | POA: Diagnosis not present

## 2024-07-30 DIAGNOSIS — E119 Type 2 diabetes mellitus without complications: Secondary | ICD-10-CM | POA: Diagnosis not present

## 2024-07-30 DIAGNOSIS — K253 Acute gastric ulcer without hemorrhage or perforation: Secondary | ICD-10-CM

## 2024-07-30 DIAGNOSIS — K921 Melena: Secondary | ICD-10-CM

## 2024-07-30 DIAGNOSIS — K2970 Gastritis, unspecified, without bleeding: Secondary | ICD-10-CM

## 2024-07-30 DIAGNOSIS — K2950 Unspecified chronic gastritis without bleeding: Secondary | ICD-10-CM | POA: Diagnosis not present

## 2024-07-30 DIAGNOSIS — E1165 Type 2 diabetes mellitus with hyperglycemia: Secondary | ICD-10-CM

## 2024-07-30 LAB — COMPREHENSIVE METABOLIC PANEL WITH GFR
ALT: 21 U/L (ref 0–44)
AST: 23 U/L (ref 15–41)
Albumin: 3.1 g/dL — ABNORMAL LOW (ref 3.5–5.0)
Alkaline Phosphatase: 53 U/L (ref 38–126)
Anion gap: 10 (ref 5–15)
BUN: 19 mg/dL (ref 8–23)
CO2: 22 mmol/L (ref 22–32)
Calcium: 8.5 mg/dL — ABNORMAL LOW (ref 8.9–10.3)
Chloride: 108 mmol/L (ref 98–111)
Creatinine, Ser: 0.87 mg/dL (ref 0.44–1.00)
GFR, Estimated: >60 mL/min (ref >60)
Glucose, Bld: 138 mg/dL — ABNORMAL HIGH (ref 70–99)
Potassium: 3.6 mmol/L (ref 3.5–5.1)
Sodium: 140 mmol/L (ref 135–145)
Total Bilirubin: 0.5 mg/dL (ref 0.0–1.2)
Total Protein: 5.3 g/dL — ABNORMAL LOW (ref 6.5–8.1)

## 2024-07-30 LAB — CBC WITH DIFFERENTIAL/PLATELET
Abs Immature Granulocytes: 0.04 K/uL (ref 0.00–0.07)
Basophils Absolute: 0 K/uL (ref 0.0–0.1)
Basophils Relative: 1 %
Eosinophils Absolute: 0.4 K/uL (ref 0.0–0.5)
Eosinophils Relative: 4 %
HCT: 19 % — ABNORMAL LOW (ref 36.0–46.0)
Hemoglobin: 6.1 g/dL — CL (ref 12.0–15.0)
Immature Granulocytes: 1 %
Lymphocytes Relative: 24 %
Lymphs Abs: 1.9 K/uL (ref 0.7–4.0)
MCH: 31 pg (ref 26.0–34.0)
MCHC: 32.1 g/dL (ref 30.0–36.0)
MCV: 96.4 fL (ref 80.0–100.0)
Monocytes Absolute: 0.7 K/uL (ref 0.1–1.0)
Monocytes Relative: 9 %
Neutro Abs: 4.9 K/uL (ref 1.7–7.7)
Neutrophils Relative %: 61 %
Platelets: 322 K/uL (ref 150–400)
RBC: 1.97 MIL/uL — ABNORMAL LOW (ref 3.87–5.11)
RDW: 16 % — ABNORMAL HIGH (ref 11.5–15.5)
WBC: 7.9 K/uL (ref 4.0–10.5)
nRBC: 0 % (ref 0.0–0.2)

## 2024-07-30 LAB — PROTIME-INR
INR: 1.1 (ref 0.8–1.2)
Prothrombin Time: 14.9 s (ref 11.4–15.2)

## 2024-07-30 LAB — GLUCOSE, CAPILLARY: Glucose-Capillary: 105 mg/dL — ABNORMAL HIGH (ref 70–99)

## 2024-07-30 LAB — POC OCCULT BLOOD, ED: Fecal Occult Bld: POSITIVE — AB

## 2024-07-30 LAB — PREPARE RBC (CROSSMATCH)

## 2024-07-30 LAB — APTT: aPTT: 27 s (ref 24–36)

## 2024-07-30 MED ORDER — PANTOPRAZOLE SODIUM 40 MG IV SOLR
40.0000 mg | Freq: Two times a day (BID) | INTRAVENOUS | Status: DC
  Administered 2024-07-31 – 2024-08-02 (×5): 40 mg via INTRAVENOUS
  Filled 2024-07-30 (×5): qty 10

## 2024-07-30 MED ORDER — ONDANSETRON HCL 4 MG/2ML IJ SOLN: 4.0000 mg | Freq: Four times a day (QID) | INTRAMUSCULAR | Status: DC | PRN

## 2024-07-30 MED ORDER — GABAPENTIN 300 MG PO CAPS
300.0000 mg | ORAL_CAPSULE | Freq: Every day | ORAL | Status: DC
  Administered 2024-07-30 – 2024-08-01 (×3): 300 mg via ORAL
  Filled 2024-07-30 (×3): qty 1

## 2024-07-30 MED ORDER — SODIUM CHLORIDE 0.9 % IV BOLUS
1000.0000 mL | Freq: Once | INTRAVENOUS | Status: AC
  Administered 2024-07-30: 1000 mL via INTRAVENOUS

## 2024-07-30 MED ORDER — SODIUM CHLORIDE 0.9% IV SOLUTION: Freq: Once | INTRAVENOUS | Status: DC

## 2024-07-30 MED ORDER — FENTANYL CITRATE PF 50 MCG/ML IJ SOSY: 12.5000 ug | PREFILLED_SYRINGE | INTRAMUSCULAR | Status: DC | PRN

## 2024-07-30 MED ORDER — IOHEXOL 350 MG/ML SOLN
125.0000 mL | Freq: Once | INTRAVENOUS | Status: AC | PRN
  Administered 2024-07-31: 125 mL via INTRAVENOUS

## 2024-07-30 MED ORDER — POLYETHYLENE GLYCOL 3350 17 G PO PACK
17.0000 g | PACK | Freq: Every day | ORAL | Status: DC | PRN
Start: 2024-07-30 — End: 2024-08-02

## 2024-07-30 MED ORDER — ACETAMINOPHEN 650 MG RE SUPP: 650.0000 mg | Freq: Four times a day (QID) | RECTAL | Status: DC | PRN

## 2024-07-30 MED ORDER — OXYCODONE HCL 5 MG PO TABS
2.5000 mg | ORAL_TABLET | ORAL | Status: DC | PRN
  Administered 2024-07-30: 2.5 mg via ORAL
  Administered 2024-07-31 – 2024-08-01 (×3): 5 mg via ORAL
  Filled 2024-07-30 (×4): qty 1

## 2024-07-30 MED ORDER — ONDANSETRON HCL 4 MG PO TABS: 4.0000 mg | ORAL_TABLET | Freq: Four times a day (QID) | ORAL | Status: DC | PRN

## 2024-07-30 MED ORDER — INSULIN ASPART 100 UNIT/ML IJ SOLN: 0.0000 [IU] | Freq: Four times a day (QID) | INTRAMUSCULAR | Status: DC

## 2024-07-30 MED ORDER — ACETAMINOPHEN 325 MG PO TABS: 650.0000 mg | ORAL_TABLET | Freq: Four times a day (QID) | ORAL | Status: DC | PRN

## 2024-07-30 MED ORDER — SODIUM CHLORIDE 0.9% IV SOLUTION: Freq: Once | INTRAVENOUS | Status: AC

## 2024-07-30 MED ORDER — LACTATED RINGERS IV SOLN: INTRAVENOUS | Status: AC

## 2024-07-30 MED ORDER — PANTOPRAZOLE SODIUM 40 MG IV SOLR
40.0000 mg | INTRAVENOUS | Status: AC
  Administered 2024-07-30: 40 mg via INTRAVENOUS
  Filled 2024-07-30 (×2): qty 10

## 2024-07-30 NOTE — ED Provider Notes (Signed)
 [place] EMERGENCY DEPARTMENT AT [facility] Provider Note   CSN: [id number] Arrival date & time: 07/30/24  1535     Patient presents with: GI Bleeding   [name] is a 78 y.o. female with h/o hypertension, diabetes, chronic diastolic heart failure, paroxysmal A-fib on Xarelto  presents emerged from today for bloody stools for the past 3 to 4 days.  Patient reports that she had darker colored stools with some blood present.  Does have diarrhea/softer stool at baseline which has been unchanged in amount or consistency, just with coloration.  Reports he was having some nausea but no vomiting.  Was experiencing more lightheadedness with standing but no room spinning sensation.  No chest pain or shortness of breath but has been feeling more fatigued.  No recent cough or cold symptoms.  Patient reports that she has never had blood in her stool previously or received any blood transfusion.  Today, the patient was in the shower when she reported that she felt lightheaded like she was going to pass out with her husband there and syncopized however patient says I was able to lower her down to the ground and did not hit her head.  She quickly awakened afterwards.  Denies any unilateral weakness trouble walking or talking.  Reports she is not feeling more lightheaded upon standing or position changes.  Patient family member was able to show me a picture of the stool, does appear to be darker in coloration with some surrounding blood however is difficult to completely see.  HPI     Prior to Admission medications   Medication Sig Start Date End Date Taking? Authorizing Provider  acetaminophen  (TYLENOL ) 500 MG tablet Take 1,000 mg by mouth every 6 (six) hours as needed for mild pain (pain score 1-3) or headache.   Yes [provider]  aspirin  EC 81 MG tablet Take 1 tablet (81 mg total) by mouth daily. Swallow whole. 05/31/24  Yes Leotis Bogus, MD  cholecalciferol  (VITAMIN D3) 25 MCG  (1000 UNIT) tablet Take 1,000 Units by mouth daily.   Yes [provider]  cyanocobalamin (VITAMIN B12) 1000 MCG tablet Take 1,000 mcg by mouth daily.   Yes [provider]  dimenhyDRINATE (DRAMAMINE) 50 MG tablet Take 50 mg by mouth every 8 (eight) hours as needed for dizziness.   Yes [provider]  escitalopram  (LEXAPRO ) 20 MG tablet Take 20 mg by mouth daily. 05/23/24  Yes [provider]  ferrous sulfate  325 (65 FE) MG tablet Take 1 tablet (325 mg total) by mouth daily with breakfast. 10/25/16  Yes Rehman, Claudis PENNER, MD  furosemide  (LASIX ) 20 MG tablet TAKE 1 TABLET BY MOUTH TWICE DAILY.TAKE AN ADDITIONAL TABLET DAILY AS NEEDED FOR SHORTNESS OF BREATH 07/01/24  Yes Mallipeddi, Vishnu P, MD  gabapentin  (NEURONTIN ) 300 MG capsule Take 300 mg by mouth at bedtime.   Yes [provider]  KLOR-CON  M20 20 MEQ tablet TAKE 1 TABLET BY MOUTH EVERY DAY 05/02/24  Yes Mallipeddi, Vishnu P, MD  Lactobacillus (INTESTINEX PO) Take 1 capsule by mouth daily. Caplet   Yes [provider]  metFORMIN (GLUCOPHAGE) 500 MG tablet Take 500 mg by mouth 2 (two) times daily with a meal.    Yes [provider]  metoprolol  tartrate (LOPRESSOR ) 25 MG tablet Take 0.5 tablets (12.5 mg total) by mouth as needed (hr over 100 bpm). 03/07/24 03/02/25 Yes Mallipeddi, Vishnu P, MD  montelukast  (SINGULAIR ) 10 MG tablet Take 10 mg by mouth every morning.  Yes [provider]  omeprazole  (PRILOSEC) 20 MG capsule Take 20 mg by mouth 2 (two) times daily before a meal.   Yes [provider]  Phenazopyrid-Cranbry-C-Probiot (AZO URINARY TRACT SUPPORT PO) Take 2 tablets by mouth daily.   Yes [provider]  Probiotic Product (PROBIOTIC DAILY PO) Take 1 capsule by mouth daily.   Yes [provider]  rivaroxaban  (XARELTO ) 20 MG TABS tablet Take 20 mg by mouth daily with supper.   Yes [provider]  Turmeric 450 MG CAPS Take 450 mg by mouth  daily.   Yes [provider]  zolpidem  (AMBIEN ) 10 MG tablet Take 10 mg by mouth at bedtime as needed for sleep. 04/25/24  Yes [provider]    Allergies: Cephalexin, Nsaids, Darvocet [propoxyphene n-acetaminophen ], Penicillins, and Percocet [oxycodone -acetaminophen ]    Review of Systems  Constitutional:  Positive for fatigue. Negative for chills and fever.  Respiratory:  Negative for shortness of breath.   Cardiovascular:  Negative for chest pain.  Gastrointestinal:  Positive for blood in stool, diarrhea and nausea. Negative for abdominal pain, constipation and vomiting.  Genitourinary:  Negative for dysuria and hematuria.  Neurological:  Positive for syncope and light-headedness. Negative for dizziness, speech difficulty, weakness and headaches.    Updated Vital Signs BP (!) 118/50   Pulse 81   Temp 98.5 F (36.9 C)   Resp 14   Ht 5' 4 (1.626 m)   Wt 80 kg   SpO2 90%   BMI 30.27 kg/m   Physical Exam Vitals and nursing note reviewed. Exam conducted with a chaperone present Stoney, RN).  Constitutional:      Appearance: She is not toxic-appearing.     Comments: Pale appearing, but in no acute distress  HENT:     Mouth/Throat:     Mouth: Mucous membranes are moist.  Eyes:     General: No scleral icterus. Cardiovascular:     Rate and Rhythm: Normal rate.  Pulmonary:     Effort: Pulmonary effort is normal. No respiratory distress.  Abdominal:     Palpations: Abdomen is soft.     Tenderness: There is no abdominal tenderness. There is no guarding or rebound.  Genitourinary:    Comments: Maroon colored stool present on DRE.  No brisk bleeding or bright red blood noted. Skin:    General: Skin is warm and dry.     Coloration: Skin is pale.  Neurological:     General: No focal deficit present.     Mental Status: She is alert.     Cranial Nerves: No cranial nerve deficit.     Sensory: No sensory deficit.     Motor: No weakness.     (all labs ordered  are listed, but only abnormal results are displayed) Labs Reviewed  CBC WITH DIFFERENTIAL/PLATELET - Abnormal; Notable for the following components:      Result Value   RBC 1.97 (*)    Hemoglobin 6.1 (*)    HCT 19.0 (*)    RDW 16.0 (*)    All other components within normal limits  COMPREHENSIVE METABOLIC PANEL WITH GFR - Abnormal; Notable for the following components:   Glucose, Bld 138 (*)    Calcium 8.5 (*)    Total Protein 5.3 (*)    Albumin 3.1 (*)    All other components within normal limits  POC OCCULT BLOOD, ED - Abnormal; Notable for the following components:   Fecal Occult Bld POSITIVE (*)    All other  components within normal limits  PROTIME-INR  APTT  I-STAT CHEM 8, ED  TYPE AND SCREEN  PREPARE RBC (CROSSMATCH)    EKG: None  Radiology: No results found.  .Critical Care  Performed by: Bernis Ernst, PA-C Authorized by: Bernis Ernst, PA-C   Critical care provider statement:    Critical care time (minutes):  60   Critical care was necessary to treat or prevent imminent or life-threatening deterioration of the following conditions: Acute GI bleed.   Critical care was time spent personally by me on the following activities:  Development of treatment plan with patient or surrogate, discussions with consultants, evaluation of patient's response to treatment, examination of patient, ordering and review of laboratory studies, ordering and review of radiographic studies, ordering and performing treatments and interventions, pulse oximetry, re-evaluation of patient's condition, review of old charts and obtaining history from patient or surrogate   Care discussed with: admitting provider   Comments:     Acute GI bleed requiring transfusion.  Consultation with GI and hospitalist.  CT angio ordered.    Medications Ordered in the ED  0.9 %  sodium chloride  infusion (Manually program via Guardrails IV Fluids) (has no administration in time range)  pantoprazole  (PROTONIX )  injection 40 mg (has no administration in time range)    Followed by  pantoprazole  (PROTONIX ) injection 40 mg (has no administration in time range)  sodium chloride  0.9 % bolus 1,000 mL (1,000 mLs Intravenous New Bag/Given 07/30/24 1622)    Medical Decision Making Amount and/or Complexity of Data Reviewed Labs: ordered. Radiology: ordered.  Risk Prescription drug management. Decision regarding hospitalization.   78 y.o. female presents to the ER for evaluation of hematochezia and lightheadedness. Differential diagnosis includes but is not limited to BPPV, vestibular migraine, head trauma, AVM, intracranial tumor, multiple sclerosis, drug-related, CVA, orthostatic hypotension, sepsis, hypoglycemia, electrolyte disturbance, anemia, anxiety, GI bleed, PUD, fistula, IBD, hemorrhoids. Vital signs borderline low blood pressure how appears around her baseline.  No other vital sign abnormality. Physical exam as noted above.   Patient is extremely pale upon presentation.  Suspecting her to have a low hemoglobin.  I have already ordered a type and screen but will order 1 unit PRBC given symptomatic as well as anemic on presentation.  DRE shows maroon-colored stools but no brisk bleeding.  I independently reviewed and interpreted the patient's labs.  Hemoccult positive.  CBC with a hemoglobin of 6.1 and hematocrit of 19.0.  Normal platelets.  No leukocytosis.  PT/INR and APTT within normal limits.  CMP shows glucose of 138.  Mildly decreased calcium, total protein, and albumin otherwise no other electrolyte or LFT abnormality.  I discussed risk and benefits of blood transfusion with patient and spouse at bedside.  They agree with blood transfusion and verbalized their understanding of the risks.  PRBC ordered.  Patient's lightheadedness likely due to her significant anemia.  She has no focal deficit, doubt any acute stroke.  I consulted GI spoke to Dr. Eartha.  He recommend doing the acute GI bleed CT  angio.  He agreed with the Protonix .  He discussed that the patient continues to have worsening of hematochezia to give her the reversal agent for her Xarelto .  Goal hemoglobin for her will be greater than 8.0.  Will need admission to hospitalist for continued blood transfusion.  CTA ordered at this time.  Patient will need to be admitted for acute GI bleed with anemia.  Patient and family members are aware of the need to be admitted  for transfusion and for further evaluation.  Hospitalist to admit.  Portions of this report may have been transcribed using voice recognition software. Every effort was made to ensure accuracy; however, inadvertent computerized transcription errors may be present.    Final diagnoses:  Acute GI bleeding  Anemia, unspecified type    ED Discharge Orders     None          Bernis Ernst, DEVONNA 07/30/24 2052    Melvenia Motto, MD 07/30/24 [phone]

## 2024-07-30 NOTE — ED Triage Notes (Signed)
 Pt c/o rectal bleeding x3days. Pt is on aspirin  and xarelto . When ems arrived pt's blood pressure was 93/47 and they gave her 500cc of NS.

## 2024-07-30 NOTE — Assessment & Plan Note (Signed)
 Controlled. -Hold hold as needed metoprolol  -Hold Xarelto  in the setting of acute GI bleed

## 2024-07-30 NOTE — Assessment & Plan Note (Signed)
 Controlled, A1c 6.3- 12/2023 - SSI- S Q6h - Hold home metformin

## 2024-07-30 NOTE — Assessment & Plan Note (Signed)
 Stable and Compensated.  Recent echo 05/2024 EF 60 to 65%, -Hold home Lasix  20 mg BID

## 2024-07-30 NOTE — Assessment & Plan Note (Signed)
 Stable-systolic 118-130.  EMS report blood pressure 93/47. - Hold metoprolol  12.5 mg, Lasix  20 mg

## 2024-07-30 NOTE — H&P (Addendum)
 History and Physical    [name] FMW:[id number] DOB: Oct 23, 1946 DOA: 07/30/2024  PCP: Marvine Rush, MD   Patient coming from: Home  I have personally briefly reviewed patient's old medical records in [facility]  Chief Complaint: Rectal bleeding  HPI: [name] is a 78 y.o. female with medical history significant for fibrillation on anticoagulation, hypertension, diabetes mellitus, asthma, CHF, PUD. Patient presented to the ED with complaints of bleeding per rectum.  She reports over the past 3 days, she has had bleeding with stools, up to 3-4 times a day.  Today the last episode about 2:30 PM today, was watery, with a lot of blood.  No vomiting.  No abdominal pain.  She denies NSAID use.  Last dose of Xarelto  was last night.  She reports dizziness, with abnormal sensation in her bilateral lower extremities.  EMS report blood pressure of 93/47. 500 mL bolus given.  ED Course: Temperature 98.6.  Heart rate 81-103.  Respiratory rate 14 to 133.  O2 sats greater than 90% on room air. Hemoglobin 6.1. 1 L PRBC ordered for transfusion. EDP talked to Dr. Eartha patient's gastroenterologist.  Will see in consult in a.m., IV Protonix  twice daily, recommended getting CTA GI bleed.  Review of Systems: As per HPI all other systems reviewed and negative.  Past Medical History:  Diagnosis Date   Anxiety 12/29/2022   Arthritis    Asthma    Carpal tunnel syndrome    Depression    Dyspnea    Dysrhythmia    Gastric ulcer    GERD (gastroesophageal reflux disease)    H/O hiatal hernia    Heart murmur    since birth   History of heart murmur in childhood    History of kidney stones    Hypertension    Iron deficiency anemia    Neuropathy    Following neck surgery.   Paralysis (HCC)    PONV (postoperative nausea and vomiting)    Primary localized osteoarthritis of right knee 02/06/2024   Pulmonary embolus [facility])    November 2020   Type 2 diabetes mellitus [facility])     Past  Surgical History:  Procedure Laterality Date   APPENDECTOMY     ATRIAL FIBRILLATION ABLATION N/A 10/09/2023   Procedure: ATRIAL FIBRILLATION ABLATION;  Surgeon: Kennyth Chew, MD;  Location: [facility] INVASIVE CV LAB;  Service: Cardiovascular;  Laterality: N/A;   BIOPSY  06/01/2022   Procedure: BIOPSY;  Surgeon: Golda Claudis PENNER, MD;  Location: [facility] ENDO SUITE;  Service: Endoscopy;;   CHOLECYSTECTOMY     COLONOSCOPY     COLONOSCOPY N/A 09/26/2013   Procedure: COLONOSCOPY;  Surgeon: Claudis PENNER Golda, MD;  Location: [facility] ENDO SU[facility];  Service: Endoscopy;  Laterality: N/A;  1030-rescheduled to 12:00pm Ann notified pt   COLONOSCOPY N/A 03/27/2015   Procedure: COLONOSCOPY;  Surgeon: Claudis PENNER Golda, MD;  Location: [facility] ENDO SUITE;  Service: Endoscopy;  Laterality: N/A;  11:15 - moved to 8:25 - Ann to notify pt   COLONOSCOPY WITH PROPOFOL  N/A 06/01/2022   Procedure: COLONOSCOPY WITH PROPOFOL ;  Surgeon: Golda Claudis PENNER, MD;  Location: [facility];  Service: Endoscopy;  Laterality: N/A;  1005 ASA 2   CYSTOSCOPY/URETEROSCOPY/HOLMIUM LASER/STENT PLACEMENT Left 12/23/2022   Procedure: CYSTOSCOPY/ LEFT RETROGRADE/ URETEROSCOPY/HOLMIUM LASER/STENT PLACEMENT;  Surgeon: Nieves Cough, MD;  Location: [facility] ORS;  Service: Urology;  Laterality: Left;   ESOPHAGOGASTRODUODENOSCOPY  12/08/2011   Procedure: ESOPHAGOGASTRODUODENOSCOPY (EGD);  Surgeon: Claudis PENNER Golda, MD;  Location: [facility];  Service: Endoscopy;  Laterality: N/A;  3:00    ESOPHAGOGASTRODUODENOSCOPY N/A 03/27/2015   Procedure: ESOPHAGOGASTRODUODENOSCOPY (EGD);  Surgeon: Claudis RAYMOND Rivet, MD;  Location: [facility];  Service: Endoscopy;  Laterality: N/A;   EYE SURGERY Bilateral    cataract surgery   GIVENS CAPSULE STUDY N/A 11/04/2015   Procedure: GIVENS CAPSULE STUDY;  Surgeon: Claudis RAYMOND Rivet, MD;  Location: [facility];  Service: Endoscopy;  Laterality: N/A;   Hysterscopy     KNEE ARTHROSCOPY     Left   LEFT HEART CATH AND CORONARY ANGIOGRAPHY N/A  05/30/2024   Procedure: LEFT HEART CATH AND CORONARY ANGIOGRAPHY;  Surgeon: Mady Bruckner, MD;  Location: [facility] INVASIVE CV LAB;  Service: Cardiovascular;  Laterality: N/A;   NECK SURGERY     x 2    TONSILLECTOMY     TOTAL KNEE ARTHROPLASTY  07/11/2012   Procedure: TOTAL KNEE ARTHROPLASTY;  Surgeon: Toribio JULIANNA Chancy, MD;  Location[facility] OR;  Service: Orthopedics;  Laterality: Left;  left total knee arthroplasty   TOTAL KNEE ARTHROPLASTY Right 02/06/2024   Procedure: TOTAL KNEE ARTHROPLASTY;  Surgeon: Josefina Chew, MD;  Location: [facility] ORS;  Service: Orthopedics;  Laterality: Right;   UPPER GASTROINTESTINAL ENDOSCOPY     YAG LASER APPLICATION Right 02/21/2017   Procedure: YAG LASER APPLICATION;  Surgeon: Oneil Platts, MD;  Location: [facility] ORS;  Service: Ophthalmology;  Laterality: Right;     reports that she has never smoked. She has never been exposed to tobacco smoke. She has never used smokeless tobacco. She reports that she does not drink alcohol and does not use drugs.  Allergies  Allergen Reactions   Cephalexin Other (See Comments)    Unknown    Nsaids Other (See Comments)    Caused Ulcers.   Darvocet [Propoxyphene N-Acetaminophen ] Nausea Only   Penicillins Rash    Immediate rash, facial/tongue/throat swelling, SOB or lightheadedness with hypotension    Percocet [Oxycodone -Acetaminophen ] Nausea Only    Family History  Problem Relation Age of Onset   Alzheimer's disease Mother    Kidney disease Mother    Kidney disease Father    Diabetes Maternal Grandmother    Diabetes Maternal Grandfather    Cancer Paternal Grandmother        breast, liver   Hypertension Son    Cancer Maternal Aunt        leukemia   Liver disease Maternal Uncle    Kidney disease Paternal Uncle        kidney removed   Colon cancer Neg Hx     Prior to Admission medications   Medication Sig Start Date End Date Taking? Authorizing Provider  acetaminophen  (TYLENOL ) 500 MG tablet Take 1,000 mg by mouth every 6  (six) hours as needed for mild pain (pain score 1-3) or headache.   Yes [provider]  aspirin  EC 81 MG tablet Take 1 tablet (81 mg total) by mouth daily. Swallow whole. 05/31/24  Yes Leotis Bogus, MD  cholecalciferol  (VITAMIN D3) 25 MCG (1000 UNIT) tablet Take 1,000 Units by mouth daily.   Yes [provider]  cyanocobalamin (VITAMIN B12) 1000 MCG tablet Take 1,000 mcg by mouth daily.   Yes [provider]  dimenhyDRINATE (DRAMAMINE) 50 MG tablet Take 50 mg by mouth every 8 (eight) hours as needed for dizziness.   Yes [provider]  escitalopram  (LEXAPRO ) 20 MG tablet Take 20 mg by mouth daily. 05/23/24  Yes [provider]  ferrous sulfate  325 (65 FE) MG tablet Take  1 tablet (325 mg total) by mouth daily with breakfast. 10/25/16  Yes Rehman, Claudis PENNER, MD  furosemide  (LASIX ) 20 MG tablet TAKE 1 TABLET BY MOUTH TWICE DAILY.TAKE AN ADDITIONAL TABLET DAILY AS NEEDED FOR SHORTNESS OF BREATH 07/01/24  Yes Mallipeddi, Vishnu P, MD  gabapentin  (NEURONTIN ) 300 MG capsule Take 300 mg by mouth at bedtime.   Yes [provider]  KLOR-CON  M20 20 MEQ tablet TAKE 1 TABLET BY MOUTH EVERY DAY 05/02/24  Yes Mallipeddi, Vishnu P, MD  Lactobacillus (INTESTINEX PO) Take 1 capsule by mouth daily. Caplet   Yes [provider]  metFORMIN (GLUCOPHAGE) 500 MG tablet Take 500 mg by mouth 2 (two) times daily with a meal.    Yes [provider]  metoprolol  tartrate (LOPRESSOR ) 25 MG tablet Take 0.5 tablets (12.5 mg total) by mouth as needed (hr over 100 bpm). 03/07/24 03/02/25 Yes Mallipeddi, Vishnu P, MD  montelukast  (SINGULAIR ) 10 MG tablet Take 10 mg by mouth every morning.   Yes [provider]  omeprazole  (PRILOSEC) 20 MG capsule Take 20 mg by mouth 2 (two) times daily before a meal.   Yes [provider]  Phenazopyrid-Cranbry-C-Probiot (AZO URINARY TRACT SUPPORT PO) Take 2 tablets by mouth daily.   Yes [provider]   Probiotic Product (PROBIOTIC DAILY PO) Take 1 capsule by mouth daily.   Yes [provider]  rivaroxaban  (XARELTO ) 20 MG TABS tablet Take 20 mg by mouth daily with supper.   Yes [provider]  Turmeric 450 MG CAPS Take 450 mg by mouth daily.   Yes [provider]  zolpidem  (AMBIEN ) 10 MG tablet Take 10 mg by mouth at bedtime as needed for sleep. 04/25/24  Yes [provider]    Physical Exam: Vitals:   07/30/24 1538 07/30/24 1540 07/30/24 1545 07/30/24 1740  BP:  (!) 119/46 (!) 118/50 (!) 127/58  Pulse:  86 81 91  Resp:  16 14 19   Temp:  98.5 F (36.9 C)  98.4 F (36.9 C)  TempSrc:    Oral  SpO2:  92% 90%   Weight: 80 kg     Height: 5' 4 (1.626 m)       Constitutional: Pale, calm, comfortable Vitals:   07/30/24 1538 07/30/24 1540 07/30/24 1545 07/30/24 1740  BP:  (!) 119/46 (!) 118/50 (!) 127/58  Pulse:  86 81 91  Resp:  16 14 19   Temp:  98.5 F (36.9 C)  98.4 F (36.9 C)  TempSrc:    Oral  SpO2:  92% 90%   Weight: 80 kg     Height: 5' 4 (1.626 m)      Eyes: PERRL, lids and conjunctivae normal ENMT: Mucous membranes are moist.  Neck: normal, supple, no masses, no thyromegaly Respiratory: clear to auscultation bilaterally, no wheezing, no crackles. Normal respiratory effort. No accessory muscle use.  Cardiovascular: Regular rate and rhythm, faint 2/6 murmur / no rubs / gallops. No extremity edema.  Abdomen: no tenderness, no masses palpated. No hepatosplenomegaly. Musculoskeletal: no clubbing / cyanosis. No joint deformity upper and lower extremities.   Skin: no rashes, lesions, ulcers. No induration Neurologic: No facial asymmetry, moving extremities spontaneously, speech fluent. Psychiatric: Normal judgment and insight. Alert and oriented x 3. Normal mood.   Labs on Admission: I have personally reviewed following labs and imaging studies  CBC: Recent Labs  Lab 07/30/24 1540  WBC 7.9  NEUTROABS 4.9  HGB 6.1*  HCT 19.0*   MCV 96.4  PLT 322  Basic Metabolic Panel: Recent Labs  Lab 07/30/24 1540  NA 140  K 3.6  CL 108  CO2 22  GLUCOSE 138*  BUN 19  CREATININE 0.87  CALCIUM 8.5*   GFR: Estimated Creatinine Clearance: 55.4 mL/min (by C-G formula based on SCr of 0.87 mg/dL). Liver Function Tests: Recent Labs  Lab 07/30/24 1540  AST 23  ALT 21  ALKPHOS 53  BILITOT 0.5  PROT 5.3*  ALBUMIN 3.1*   Coagulation Profile: Recent Labs  Lab 07/30/24 1540  INR 1.1    Radiological Exams on Admission: No results found.  EKG: None    Assessment/Plan Principal Problem:   Acute GI bleeding Active Problems:   Essential hypertension   Asthma   Controlled type 2 diabetes mellitus (HCC)   Chronic diastolic heart failure (HCC)   PAF (paroxysmal atrial fibrillation) (HCC)  Assessment and Plan: * Acute GI bleeding Bloody stools of 3 days duration, with anemia hemoglobin of 6.1-down from 13.2 last checked 2 months ago.  Likely lower GI bleed.  No vomiting, abdominal pain.  On Xarelto  and aspirin .  Denies NSAID use.  Last colonoscopy 2023-sigmoid diverticulosis, external hemorrhoids. -1 L bolus given, transfuse 2 units PRBC -Goal Hemoglobin >/= 8 -Hold Xarelto , last dose was last night - 8/4. -EDP talked to Dr. Eartha, recommended CTA pending,  - IV Protonix  40mg  twice daily -Clear liquid diet, n.p.o. midnight - 1 L given, continue L/r 50cc/hr x 12hrs   PAF (paroxysmal atrial fibrillation) (HCC) Controlled. -Hold hold as needed metoprolol  -Hold Xarelto  in the setting of acute GI bleed  Chronic diastolic heart failure (HCC) Stable and Compensated.  Recent echo 05/2024 EF 60 to 65%, -Hold home Lasix  20 mg BID  Controlled type 2 diabetes mellitus (HCC) Controlled, A1c 6.3- 12/2023 - SSI- S Q6h - Hold home metformin  Asthma Stable.  Essential hypertension Stable-systolic 118-130.  EMS report blood pressure 93/47. - Hold metoprolol  12.5 mg, Lasix  20 mg   DVT prophylaxis:  SCDS Code Status: FULL Code Family Communication: Spouse at bedside Disposition Plan: ~ 2 days Consults called: GI Admission status: Inpt Tele I certify that at the point of admission it is my clinical judgment that the patient will require inpatient hospital care spanning beyond 2 midnights from the point of admission due to high intensity of service, high risk for further deterioration and high frequency of surveillance required.   Author: Tully FORBES Carwin, MD 07/30/2024 6:37 PM  For on call review [email or web address].

## 2024-07-30 NOTE — Assessment & Plan Note (Addendum)
 Bloody stools of 3 days duration, with anemia hemoglobin of 6.1-down from 13.2 last checked 2 months ago.  Likely lower GI bleed.  No vomiting, abdominal pain.  On Xarelto  and aspirin .  Denies NSAID use.  Last colonoscopy 2023-sigmoid diverticulosis, external hemorrhoids. -1 L bolus given, transfuse 2 units PRBC -Goal Hemoglobin >/= 8 -Hold Xarelto , last dose was last night - 8/4. -EDP talked to Dr. Eartha, recommended CTA pending,  - IV Protonix  40mg  twice daily -Clear liquid diet, n.p.o. midnight - 1 L given, continue L/r 50cc/hr x 12hrs

## 2024-07-30 NOTE — Assessment & Plan Note (Signed)
 Stable

## 2024-07-30 NOTE — Plan of Care (Signed)
  Problem: Education: Goal: Knowledge of General Education information will improve Description: Including pain rating scale, medication(s)/side effects and non-pharmacologic comfort measures Outcome: Progressing   Problem: Health Behavior/Discharge Planning: Goal: Ability to manage health-related needs will improve Outcome: Progressing   Problem: Clinical Measurements: Goal: Ability to maintain clinical measurements within normal limits will improve Outcome: Progressing Goal: Will remain free from infection Outcome: Progressing Goal: Diagnostic test results will improve Outcome: Progressing Goal: Respiratory complications will improve Outcome: Progressing Goal: Cardiovascular complication will be avoided Outcome: Progressing   Problem: Activity: Goal: Risk for activity intolerance will decrease Outcome: Progressing   Problem: Nutrition: Goal: Adequate nutrition will be maintained Outcome: Progressing   Problem: Elimination: Goal: Will not experience complications related to bowel motility Outcome: Progressing Goal: Will not experience complications related to urinary retention Outcome: Progressing   Problem: Pain Managment: Goal: General experience of comfort will improve and/or be controlled Outcome: Progressing   Problem: Safety: Goal: Ability to remain free from injury will improve Outcome: Progressing   Problem: Skin Integrity: Goal: Risk for impaired skin integrity will decrease Outcome: Progressing   Problem: Education: Goal: Ability to describe self-care measures that may prevent or decrease complications (Diabetes Survival Skills Education) will improve Outcome: Progressing Goal: Individualized Educational Video(s) Outcome: Progressing   Problem: Coping: Goal: Ability to adjust to condition or change in health will improve Outcome: Progressing   Problem: Fluid Volume: Goal: Ability to maintain a balanced intake and output will improve Outcome:  Progressing   Problem: Health Behavior/Discharge Planning: Goal: Ability to identify and utilize available resources and services will improve Outcome: Progressing Goal: Ability to manage health-related needs will improve Outcome: Progressing   Problem: Metabolic: Goal: Ability to maintain appropriate glucose levels will improve Outcome: Progressing   Problem: Nutritional: Goal: Maintenance of adequate nutrition will improve Outcome: Progressing Goal: Progress toward achieving an optimal weight will improve Outcome: Progressing   Problem: Skin Integrity: Goal: Risk for impaired skin integrity will decrease Outcome: Progressing   Problem: Tissue Perfusion: Goal: Adequacy of tissue perfusion will improve Outcome: Progressing

## 2024-07-31 DIAGNOSIS — K625 Hemorrhage of anus and rectum: Secondary | ICD-10-CM | POA: Diagnosis not present

## 2024-07-31 DIAGNOSIS — K449 Diaphragmatic hernia without obstruction or gangrene: Secondary | ICD-10-CM | POA: Diagnosis not present

## 2024-07-31 DIAGNOSIS — K5730 Diverticulosis of large intestine without perforation or abscess without bleeding: Secondary | ICD-10-CM | POA: Diagnosis not present

## 2024-07-31 DIAGNOSIS — D649 Anemia, unspecified: Secondary | ICD-10-CM

## 2024-07-31 DIAGNOSIS — K921 Melena: Secondary | ICD-10-CM

## 2024-07-31 DIAGNOSIS — N200 Calculus of kidney: Secondary | ICD-10-CM | POA: Diagnosis not present

## 2024-07-31 DIAGNOSIS — K922 Gastrointestinal hemorrhage, unspecified: Secondary | ICD-10-CM | POA: Diagnosis not present

## 2024-07-31 LAB — POCT I-STAT, CHEM 8
BUN: 16 mg/dL (ref 8–23)
Calcium, Ion: 1.26 mmol/L (ref 1.15–1.40)
Chloride: 105 mmol/L (ref 98–111)
Creatinine, Ser: 0.9 mg/dL (ref 0.44–1.00)
Glucose, Bld: 133 mg/dL — ABNORMAL HIGH (ref 70–99)
HCT: 27 % — ABNORMAL LOW (ref 36.0–46.0)
Hemoglobin: 9.2 g/dL — ABNORMAL LOW (ref 12.0–15.0)
Potassium: 3.8 mmol/L (ref 3.5–5.1)
Sodium: 140 mmol/L (ref 135–145)
TCO2: 22 mmol/L (ref 22–32)

## 2024-07-31 LAB — CBC
HCT: 25.4 % — ABNORMAL LOW (ref 36.0–46.0)
HCT: 31.3 % — ABNORMAL LOW (ref 36.0–46.0)
Hemoglobin: 8.4 g/dL — ABNORMAL LOW (ref 12.0–15.0)
Hemoglobin: 9.8 g/dL — ABNORMAL LOW (ref 12.0–15.0)
MCH: 30.4 pg (ref 26.0–34.0)
MCH: 29.5 pg (ref 26.0–34.0)
MCHC: 33.1 g/dL (ref 30.0–36.0)
MCHC: 31.3 g/dL (ref 30.0–36.0)
MCV: 92 fL (ref 80.0–100.0)
MCV: 94.3 fL (ref 80.0–100.0)
Platelets: 261 K/uL (ref 150–400)
Platelets: 307 K/uL (ref 150–400)
RBC: 2.76 MIL/uL — ABNORMAL LOW (ref 3.87–5.11)
RBC: 3.32 MIL/uL — ABNORMAL LOW (ref 3.87–5.11)
RDW: 16.1 % — ABNORMAL HIGH (ref 11.5–15.5)
RDW: 16.4 % — ABNORMAL HIGH (ref 11.5–15.5)
WBC: 7.6 K/uL (ref 4.0–10.5)
WBC: 8.9 K/uL (ref 4.0–10.5)
nRBC: 0 % (ref 0.0–0.2)
nRBC: 0 % (ref 0.0–0.2)

## 2024-07-31 LAB — BASIC METABOLIC PANEL WITH GFR
Anion gap: 8 (ref 5–15)
BUN: 13 mg/dL (ref 8–23)
CO2: 25 mmol/L (ref 22–32)
Calcium: 8.5 mg/dL — ABNORMAL LOW (ref 8.9–10.3)
Chloride: 108 mmol/L (ref 98–111)
Creatinine, Ser: 0.81 mg/dL (ref 0.44–1.00)
GFR, Estimated: >60 mL/min (ref >60)
Glucose, Bld: 103 mg/dL — ABNORMAL HIGH (ref 70–99)
Potassium: 3.9 mmol/L (ref 3.5–5.1)
Sodium: 141 mmol/L (ref 135–145)

## 2024-07-31 LAB — BPAM RBC
Blood Product Expiration Date: 202508292359
Blood Product Expiration Date: 202508312359
ISSUE DATE / TIME: 202508051731
ISSUE DATE / TIME: 202508052104
Unit Type and Rh: 1700
Unit Type and Rh: 6200

## 2024-07-31 LAB — TYPE AND SCREEN
ABO/RH(D): AB POS
Antibody Screen: NEGATIVE
Unit division: 0
Unit division: 0

## 2024-07-31 LAB — HEMOGLOBIN AND HEMATOCRIT, BLOOD
HCT: 26.1 % — ABNORMAL LOW (ref 36.0–46.0)
Hemoglobin: 8.6 g/dL — ABNORMAL LOW (ref 12.0–15.0)

## 2024-07-31 LAB — GLUCOSE, CAPILLARY
Glucose-Capillary: 101 mg/dL — ABNORMAL HIGH (ref 70–99)
Glucose-Capillary: 94 mg/dL (ref 70–99)
Glucose-Capillary: 98 mg/dL (ref 70–99)
Glucose-Capillary: 90 mg/dL (ref 70–99)

## 2024-07-31 MED ORDER — SODIUM CHLORIDE 0.9 % IV SOLN: INTRAVENOUS | Status: DC

## 2024-07-31 MED ORDER — ZOLPIDEM TARTRATE 5 MG PO TABS: 5.0000 mg | ORAL_TABLET | Freq: Every evening | ORAL | Status: DC | PRN

## 2024-07-31 MED ORDER — PEG 3350-KCL-NA BICARB-NACL 420 G PO SOLR
4000.0000 mL | Freq: Once | ORAL | Status: AC
  Administered 2024-07-31: 4000 mL via ORAL

## 2024-07-31 MED ORDER — ESCITALOPRAM OXALATE 10 MG PO TABS
20.0000 mg | ORAL_TABLET | Freq: Every day | ORAL | Status: DC
  Administered 2024-07-31 – 2024-08-02 (×3): 20 mg via ORAL
  Filled 2024-07-31 (×3): qty 2

## 2024-07-31 MED ORDER — MONTELUKAST SODIUM 10 MG PO TABS
10.0000 mg | ORAL_TABLET | Freq: Every morning | ORAL | Status: DC
  Administered 2024-07-31 – 2024-08-02 (×3): 10 mg via ORAL
  Filled 2024-07-31 (×3): qty 1

## 2024-07-31 NOTE — Progress Notes (Signed)
 Progress Note   Patient: [name] FMW:[id number] DOB: [id number] DOA: 07/30/2024     1 DOS: the patient was seen and examined on 07/31/2024   Brief hospital course: 78 y o woman with PMH of atrial fibrillation on anticoagulation, hypertension, diabetes mellitus, asthma, CHF, PUD who presented to the ED with BRBPR. Gastroenterology was consulted. CTA showed no acute bleed.   Assessment and Plan:  Acute GI bleeding Bloody stools of 3 days duration Likely lower GI bleed.  No vomiting, abdominal pain.  On Xarelto  and aspirin .  Denies NSAID use.   Last colonoscopy 2023-sigmoid diverticulosis, external hemorrhoids. CTA done pending endoscopic evaluation.  Showed no evidence of active GI bleeding. Patient has a large hiatal hernia.  Gastroenterology consulted. Input appreciated.  -Holding  Xarelto , last dose was evening of 8/4. - IV Protonix  40mg  twice daily -Clear liquid diet, n.p.o. midnight - For EGD and colonoscopy in AM.  - Colon prep tonight.   Acute blood loss anemia.  Due to GI bleed.  Baseline Hgb approx 13. Presented with Hgb of 6.1. Received 2 units PRBCs.  - Will transfuse to maintain Hgb >7  PAF (paroxysmal atrial fibrillation) (HCC) Controlled. -Hold hold as needed metoprolol  -Hold Xarelto  in the setting of acute GI bleed  Chronic diastolic heart failure (HCC) Stable and Compensated.  Recent echo 05/2024 EF 60 to 65%, -Hold home Lasix  20 mg BID  Controlled type 2 diabetes mellitus (HCC) Controlled, A1c 6.3- 12/2023 - SSI- S Q6h - Hold home metformin  Asthma Stable.  Essential hypertension Stable-systolic 118-130.  EMS report blood pressure 93/47. - Hold metoprolol  12.5 mg, Lasix  20 mg     Subjective: Patient states she has not had any rectal bleeding today.   Physical Exam: Vitals:   07/30/24 2359 07/31/24 0444 07/31/24 0823 07/31/24 1317  BP: (!) 122/52 126/63 (!) 115/49 (!) 128/53  Pulse: 77 83 74 80  Resp: 20 20 18    Temp: 98.5 F (36.9 C) 99  F (37.2 C) 97.8 F (36.6 C) 99 F (37.2 C)  TempSrc:  Oral Oral Oral  SpO2: 97% 97% 93% 99%  Weight:      Height:       Physical Exam   General: Alert, oriented X3  Eyes: Pupils equal, reactive  Oral cavity: moist mucous membranes  Head: Atraumatic, normocephalic  Neck: supple  Chest: clear to auscultation. No crackles, no wheezes  CVS: S1,S2 RRR. No murmurs  Abd: No distention, soft, non-tender. No masses palpable  Extr: No edema   MSK: No joint deformities or swelling  Neurological: Grossly intact.    Data Reviewed:     Latest Ref Rng & Units 07/31/2024    5:34 AM 07/31/2024    2:08 AM 07/30/2024    4:30 PM  CBC  WBC 4.0 - 10.5 K/uL  7.6    Hemoglobin 12.0 - 15.0 g/dL 8.6  8.4  9.2   Hematocrit 36.0 - 46.0 % 26.1  25.4  27.0   Platelets 150 - 400 K/uL  261        Latest Ref Rng & Units 07/31/2024    2:08 AM 07/30/2024    4:30 PM 07/30/2024    3:40 PM  BMP  Glucose 70 - 99 mg/dL 896  866  861   BUN 8 - 23 mg/dL 13  16  19    Creatinine 0.44 - 1.00 mg/dL 9.18  9.09  9.12   Sodium 135 - 145 mmol/L 141  140  140   Potassium  3.5 - 5.1 mmol/L 3.9  3.8  3.6   Chloride 98 - 111 mmol/L 108  105  108   CO2 22 - 32 mmol/L 25   22   Calcium 8.9 - 10.3 mg/dL 8.5   8.5      Family Communication: n/a  Disposition: Status is: Inpatient   Planned Discharge Destination: Home DVT ppx: SCDs     Time spent: 35 minutes  Author: MDALA-GAUSI, Andres Vest AGATHA, MD 07/31/2024 4:03 PM  For on call review [email or web address].

## 2024-07-31 NOTE — H&P (View-Only) (Signed)
 Gastroenterology Consult   Referring Provider: No ref. provider found Primary Care Physician:  Marvine Rush, MD Primary Gastroenterologist:  Dr. Eartha   Patient ID: [name]; [id number]; 07-24-1946   Admit date: 07/30/2024  LOS: 1 day   Date of Consultation: 07/31/2024  Reason for Consultation:  anemia and rectal bleeding   History of Present Illness   [name] is a 78 y.o. year old female with history of anxiety,arthritis, asthma, depression, gastric ulcer, GERD, HTN, IDA, neuropathy, Type 2 DM who presented to the ED with rectal bleeding x3 days. Hgb 6.1 on arrival. GI consulted for further evaluation  ED course: VSS Hgb 6.1 CT Angio GI bleed with no evidence of active bleeding, large hh containing entire stomach, portion of non obstructed transverse colon, diverticula without diverticulitis  Consult: Patient reports some dizziness for the past few days, feeling more fatigued. She notes a small amount of BRB in the toilet and when wiping on Friday, noting a regular BM. She notes that she continued to notice BRBPR throughout the weekend until yesterday when she had a larger amount of blood and only a small amount of stool which was more like diarrhea. She denies any abdominal pain. Yesterday she noted more black color to her stool as well. She has had some nausea but no vomiting. No fevers or chills. Patient reports Last dose of xarelto  was Monday night. She denies any new meds or med changes other than some motion sickness medication yesterday for her dizziness. She has not had any BMs today. No changes to appetite. She has lost about 20 pounds this year, that was not due to any diet changes, though she does note she had knee surgery in February. Denies any NSAIDs. Denies any history of rectal bleeding or episodes of anemia. She does note some early satiety around the time of her knee surgery eariler this year. She has occasional reflux, maintained on omeprazole  which keeps  symptoms pretty well controlled. Occasionally will take tums PRN. Denies any dysphagia or odynophagia.   Last EGD 2012 moderate sized sliding hiatal hernia, erosive antral gastritis with two prepyoric ulcers, patent pylorus  Last Colonoscopy:05/2022- Diverticulosis in the sigmoid colon.                           - External hemorrhoids.                           - Anal papilla(e) were hypertrophied.                           - Biopsies were taken with a cold forceps from the                            right colon, descending colon and sigmoid colon for                            evaluation of microscopic colitis.    Past Medical History:  Diagnosis Date   Anxiety 12/29/2022   Arthritis    Asthma    Carpal tunnel syndrome    Depression    Dyspnea    Dysrhythmia    Gastric ulcer    GERD (gastroesophageal reflux disease)    H/O hiatal hernia    Heart murmur  since birth   History of heart murmur in childhood    History of kidney stones    Hypertension    Iron deficiency anemia    Neuropathy    Following neck surgery.   Paralysis (HCC)    PONV (postoperative nausea and vomiting)    Primary localized osteoarthritis of right knee 02/06/2024   Pulmonary embolus [facility])    November 2020   Type 2 diabetes mellitus [facility])     Past Surgical History:  Procedure Laterality Date   APPENDECTOMY     ATRIAL FIBRILLATION ABLATION N/A 10/09/2023   Procedure: ATRIAL FIBRILLATION ABLATION;  Surgeon: Kennyth Chew, MD;  Location: [facility] INVASIVE CV LAB;  Service: Cardiovascular;  Laterality: N/A;   BIOPSY  06/01/2022   Procedure: BIOPSY;  Surgeon: Golda Claudis PENNER, MD;  Location: [facility] ENDO SU[facility];  Service: Endoscopy;;   CHOLECYSTECTOMY     COLONOSCOPY     COLONOSCOPY N/A 09/26/2013   Procedure: COLONOSCOPY;  Surgeon: Claudis PENNER Golda, MD;  Location: [facility];  Service: Endoscopy;  Laterality: N/A;  1030-rescheduled to 12:00pm Ann notified pt   COLONOSCOPY N/A 03/27/2015   Procedure: COLONOSCOPY;   Surgeon: Claudis PENNER Golda, MD;  Location: [facility];  Service: Endoscopy;  Laterality: N/A;  11:15 - moved to 8:25 - Ann to notify pt   COLONOSCOPY WITH PROPOFOL  N/A 06/01/2022   Procedure: COLONOSCOPY WITH PROPOFOL ;  Surgeon: Golda Claudis PENNER, MD;  Location: [facility];  Service: Endoscopy;  Laterality: N/A;  1005 ASA 2   CYSTOSCOPY/URETEROSCOPY/HOLMIUM LASER/STENT PLACEMENT Left 12/23/2022   Procedure: CYSTOSCOPY/ LEFT RETROGRADE/ URETEROSCOPY/HOLMIUM LASER/STENT PLACEMENT;  Surgeon: Nieves Cough, MD;  Location: [facility] ORS;  Service: Urology;  Laterality: Left;   ESOPHAGOGASTRODUODENOSCOPY  12/08/2011   Procedure: ESOPHAGOGASTRODUODENOSCOPY (EGD);  Surgeon: Claudis PENNER Golda, MD;  Location: [facility];  Service: Endoscopy;  Laterality: N/A;  3:00    ESOPHAGOGASTRODUODENOSCOPY N/A 03/27/2015   Procedure: ESOPHAGOGASTRODUODENOSCOPY (EGD);  Surgeon: Claudis PENNER Golda, MD;  [facility]: [facility];  Service: Endoscopy;  Laterality: N/A;   EYE SURGERY Bilateral    cataract surgery   GIVENS CAPSULE STUDY N/A 11/04/2015   Procedure: GIVENS CAPSULE STUDY;  Surgeon: Claudis PENNER Golda, MD;  Location: [facility];  Service: Endoscopy;  Laterality: N/A;   Hysterscopy     KNEE ARTHROSCOPY     Left   LEFT HEART CATH AND CORONARY ANGIOGRAPHY N/A 05/30/2024   Procedure: LEFT HEART CATH AND CORONARY ANGIOGRAPHY;  Surgeon: Mady Bruckner, MD;  Location: [facility] INVASIVE CV LAB;  Service: Cardiovascular;  Laterality: N/A;   NECK SURGERY     x 2    TONSILLECTOMY     TOTAL KNEE ARTHROPLASTY  07/11/2012   Procedure: TOTAL KNEE ARTHROPLASTY;  Surgeon: Toribio JULIANNA Chancy, MD;  Location: [facility] OR;  Service: Orthopedics;  Laterality: Left;  left total knee arthroplasty   TOTAL KNEE ARTHROPLASTY Right 02/06/2024   Procedure: TOTAL KNEE ARTHROPLASTY;  Surgeon: Josefina Chew, MD;  Location: [facility] ORS;  Service: Orthopedics;  Laterality: Right;   UPPER GASTROINTESTINAL ENDOSCOPY     YAG LASER APPLICATION Right 02/21/2017    Procedure: YAG LASER APPLICATION;  Surgeon: Oneil Platts, MD;  Location: [facility] ORS;  Service: Ophthalmology;  Laterality: Right;    Prior to Admission medications   Medication Sig Start Date End Date Taking? Authorizing Provider  acetaminophen  (TYLENOL ) 500 MG tablet Take 1,000 mg by mouth every 6 (six) hours as needed for mild pain (pain score 1-3) or headache.   Yes [provider]  aspirin  EC 81 MG tablet Take 1 tablet (81 mg total) by mouth daily. Swallow whole. 05/31/24  Yes Leotis Bogus, MD  cholecalciferol  (VITAMIN D3) 25 MCG (1000 UNIT) tablet Take 1,000 Units by mouth daily.   Yes [provider]  cyanocobalamin (VITAMIN B12) 1000 MCG tablet Take 1,000 mcg by mouth daily.   Yes [provider]  dimenhyDRINATE (DRAMAMINE) 50 MG tablet Take 50 mg by mouth every 8 (eight) hours as needed for dizziness.   Yes [provider]  escitalopram  (LEXAPRO ) 20 MG tablet Take 20 mg by mouth daily. 05/23/24  Yes [provider]  ferrous sulfate  325 (65 FE) MG tablet Take 1 tablet (325 mg total) by mouth daily with breakfast. 10/25/16  Yes Rehman, Najeeb U, MD  furosemide  (LASIX ) 20 MG tablet TAKE 1 TABLET BY MOUTH TWICE DAILY.TAKE AN ADDITIONAL TABLET DAILY AS NEEDED FOR SHORTNESS OF BREATH 07/01/24  Yes Mallipeddi, Vishnu P, MD  gabapentin  (NEURONTIN ) 300 MG capsule Take 300 mg by mouth at bedtime.   Yes [provider]  KLOR-CON  M20 20 MEQ tablet TAKE 1 TABLET BY MOUTH EVERY DAY 05/02/24  Yes Mallipeddi, Vishnu P, MD  Lactobacillus (INTESTINEX PO) Take 1 capsule by mouth daily. Caplet   Yes [provider]  metFORMIN (GLUCOPHAGE) 500 MG tablet Take 500 mg by mouth 2 (two) times daily with a meal.    Yes [provider]  metoprolol  tartrate (LOPRESSOR ) 25 MG tablet Take 0.5 tablets (12.5 mg total) by mouth as needed (hr over 100 bpm). 03/07/24 03/02/25 Yes Mallipeddi, Vishnu P, MD  montelukast  (SINGULAIR ) 10 MG tablet Take 10 mg by mouth  every morning.   Yes [provider]  omeprazole  (PRILOSEC) 20 MG capsule Take 20 mg by mouth 2 (two) times daily before a meal.   Yes [provider]  Phenazopyrid-Cranbry-C-Probiot (AZO URINARY TRACT SUPPORT PO) Take 2 tablets by mouth daily.   Yes [provider]  Probiotic Product (PROBIOTIC DAILY PO) Take 1 capsule by mouth daily.   Yes [provider]  rivaroxaban  (XARELTO ) 20 MG TABS tablet Take 20 mg by mouth daily with supper.   Yes [provider]  Turmeric 450 MG CAPS Take 450 mg by mouth daily.   Yes [provider]  zolpidem  (AMBIEN ) 10 MG tablet Take 10 mg by mouth at bedtime as needed for sleep. 04/25/24  Yes [provider]    Current Facility-Administered Medications  Medication Dose Route Frequency Provider Last Rate Last Admin   0.9 %  sodium chloride  infusion (Manually program via Guardrails IV Fluids)   Intravenous Once Emokpae, Ejiroghene E, MD       acetaminophen  (TYLENOL ) tablet 650 mg  650 mg Oral Q6H PRN Emokpae, Ejiroghene E, MD       Or   acetaminophen  (TYLENOL ) suppository 650 mg  650 mg Rectal Q6H PRN Emokpae, Ejiroghene E, MD       fentaNYL  (SUBLIMAZE ) injection 12.5-50 mcg  12.5-50 mcg Intravenous Q2H PRN Opyd, Timothy S, MD       gabapentin  (NEURONTIN ) capsule 300 mg  300 mg Oral QHS Emokpae, Ejiroghene E, MD   300 mg at 07/30/24 2150   insulin  aspart (novoLOG ) injection 0-9 Units  0-9 Units Subcutaneous Q6H Emokpae, Ejiroghene E, MD       ondansetron  (ZOFRAN ) tablet 4 mg  4 mg Oral Q6H PRN Emokpae, Ejiroghene E, MD       Or   ondansetron  (ZOFRAN ) injection 4 mg  4 mg  Intravenous Q6H PRN Emokpae, Ejiroghene E, MD       oxyCODONE  (Oxy IR/ROXICODONE ) immediate release tablet 2.5-5 mg  2.5-5 mg Oral Q4H PRN Opyd, Evalene RAMAN, MD   5 mg at 07/31/24 0716   pantoprazole  (PROTONIX ) injection 40 mg  40 mg Intravenous Q12H Bernis Ernst, PA-C   40 mg at 07/31/24 0400   polyethylene glycol (MIRALAX  / GLYCOLAX )  packet 17 g  17 g Oral Daily PRN Emokpae, Ejiroghene E, MD        Allergies as of 07/30/2024 - Review Complete 07/30/2024  Allergen Reaction Noted   Cephalexin Other (See Comments) 12/08/2011   Nsaids Other (See Comments) 07/06/2012   Darvocet [propoxyphene n-acetaminophen ] Nausea Only 12/08/2011   Penicillins Rash 12/30/2009   Percocet [oxycodone -acetaminophen ] Nausea Only 10/27/2011    Family History  Problem Relation Age of Onset   Alzheimer's disease Mother    Kidney disease Mother    Kidney disease Father    Diabetes Maternal Grandmother    Diabetes Maternal Grandfather    Cancer Paternal Grandmother        breast, liver   Hypertension Son    Cancer Maternal Aunt        leukemia   Liver disease Maternal Uncle    Kidney disease Paternal Uncle        kidney removed   Colon cancer Neg Hx     Social History   Socioeconomic History   Marital status: Married    Spouse name: Not on file   Number of children: Not on file   Years of education: Not on file   Highest education level: Not on file  Occupational History   Not on file  Tobacco Use   Smoking status: Never    Passive exposure: Never   Smokeless tobacco: Never  Vaping Use   Vaping status: Never Used  Substance and Sexual Activity   Alcohol use: No    Alcohol/week: 0.0 standard drinks of alcohol   Drug use: No   Sexual activity: Not Currently    Birth control/protection: Post-menopausal  Other Topics Concern   Not on file  Social History Narrative   Not on file   Social Drivers of Health   Financial Resource Strain: Low Risk  (06/25/2021)   Overall Financial Resource Strain (CARDIA)    Difficulty of Paying Living Expenses: Not hard at all  Food Insecurity: No Food Insecurity (07/30/2024)   Hunger Vital Sign    Worried About Running Out of Food in the Last Year: Never true    Ran Out of Food in the Last Year: Never true  Transportation Needs: No Transportation Needs (07/30/2024)   PRAPARE -  Administrator, Civil Service (Medical): No    Lack of Transportation (Non-Medical): No  Physical Activity: Insufficiently Active (06/25/2021)   Exercise Vital Sign    Days of Exercise per Week: 2 days    Minutes of Exercise per Session: 20 min  Stress: No Stress Concern Present (06/25/2021)   [employer] of Occupational Health - Occupational Stress Questionnaire    Feeling of Stress : Not at all  Social Connections: Socially Integrated (07/30/2024)   Social Connection and Isolation Panel    Frequency of Communication with Friends and Family: More than three times a week    Frequency of Social Gatherings with Friends and Family: More than three times a week    Attends Religious Services: 1 to 4 times per year    Active Member of Clubs or  Organizations: Yes    Attends Engineer, structural: More than 4 times per year    Marital Status: Married  Catering manager Violence: Not At Risk (07/30/2024)   Humiliation, Afraid, Rape, and Kick questionnaire    Fear of Current or Ex-Partner: No    Emotionally Abused: No    Physically Abused: No    Sexually Abused: No     Review of Systems   Gen: Denies any fever, chills, loss of appetite +weight loss  CV: Denies chest pain, heart palpitations, syncope, edema  Resp: Denies shortness of breath with rest, cough, wheezing, coughing up blood, and pleurisy. GI: +weight loss +early satiety +melena +BRBPR GU : Denies urinary burning, blood in urine, urinary frequency, and urinary incontinence. MS: Denies joint pain, limitation of movement, swelling, cramps, and atrophy.  Derm: Denies rash, itching, dry skin, hives. Psych: Denies depression, anxiety, memory loss, hallucinations, and confusion. Heme: Denies bruising or bleeding Neuro:  Denies any headaches, dizziness, paresthesias, shaking  Physical Exam   Vital Signs in last 24 hours: Temp:  [97.8 F (36.6 C)-100 F (37.8 C)] 97.8 F (36.6 C) (08/06 0823) Pulse Rate:   [74-103] 74 (08/06 0823) Resp:  [13-21] 18 (08/06 0823) BP: (115-139)/(46-88) 115/49 (08/06 0823) SpO2:  [88 %-99 %] 93 % (08/06 0823) Weight:  [79.7 kg-80 kg] 79.7 kg (08/05 1910) Last BM Date : 07/30/24  General:   Alert,  Well-developed, well-nourished, pleasant and cooperative in NAD Head:  Normocephalic and atraumatic. Eyes:  Sclera clear, no icterus.   Conjunctiva pink. Ears:  Normal auditory acuity. Mouth:  No deformity or lesions, dentition normal. Neck:  Supple; no masses Lungs:  Clear throughout to auscultation.   No wheezes, crackles, or rhonchi. No acute distress. Heart:  Regular rate and rhythm; no murmurs, clicks, rubs,  or gallops. Abdomen:  Soft, nontender and nondistended. No masses, hepatosplenomegaly or hernias noted. Normal bowel sounds, without guarding, and without rebound.   Msk:  Symmetrical without gross deformities. Normal posture. Extremities:  Without clubbing or edema. Neurologic:  Alert and  oriented x4. Skin:  Intact without significant lesions or rashes. Psych:  Alert and cooperative. Normal mood and affect.   Labs/Studies   Recent Labs Recent Labs    07/30/24 1540 07/30/24 1630 07/31/24 0208 07/31/24 0534  WBC 7.9  --  7.6  --   HGB 6.1* 9.2* 8.4* 8.6*  HCT 19.0* 27.0* 25.4* 26.1*  PLT 322  --  261  --    BMET Recent Labs    07/30/24 1540 07/30/24 1630 07/31/24 0208  NA 140 140 141  K 3.6 3.8 3.9  CL 108 105 108  CO2 22  --  25  GLUCOSE 138* 133* 103*  BUN 19 16 13   CREATININE 0.87 0.90 0.81  CALCIUM 8.5*  --  8.5*   LFT Recent Labs    07/30/24 1540  PROT 5.3*  ALBUMIN 3.1*  AST 23  ALT 21  ALKPHOS 53  BILITOT 0.5   PT/INR Recent Labs    07/30/24 1540  LABPROT 14.9  INR 1.1   Hepatitis Panel No results for input(s): HEPBSAG, HCVAB, HEPAIGM, HEPBIGM in the last 72 hours. C-Diff No results for input(s): CDIFFTOX in the last 72 hours.  Radiology/Studies CT ANGIO GI BLEED Result Date: 07/31/2024 EXAM:  CTA ABDOMEN AND PELVIS WITH IV CONTRAST 07/31/2024 02:22:18 AM TECHNIQUE: CTA images of the abdomen and pelvis with intravenous contrast. Three-dimensional MIP/volume rendered formations were performed. Automated exposure control, iterative reconstruction, and/or weight based  adjustment of the mA/kV was utilized to reduce the radiation dose to as low as reasonably achievable. of iohexol  (OMNIPAQUE ) 350 MG/ML injection was used. COMPARISON: None available. CLINICAL HISTORY: Lower GI bleed, per GI recommendation. Patient complains of rectal bleeding x 3 days. Patient is on aspirin  and Xarelto . When EMS arrived, patient's blood pressure was 93/47 and they gave her 500cc of NS. FINDINGS: VASCULATURE: The aorta and its mesenteric, renal, and iliac artery branches are widely patent without hemodynamically significant stenosis, aneurysm, or dissection. GI BLEED: No evidence of active GI bleeding. AORTA: No acute finding. No abdominal aortic aneurysm. No dissection. CELIAC TRUNK: No acute finding. No occlusion or significant stenosis. SUPERIOR MESEMTRIC ARTERY: No acute finding. No occlusion or significant stenosis. INFERIOR MESENTERIC ARTERY: No acute finding. No occlusion or significant stenosis. RENAL ARTERIES: No acute finding. No occlusion or significant stenosis. ILIAC ARTERIES: No acute finding. No occlusion or significant stenosis. ABDOMEN/PELVIS: Hepatic steatosis. Cholecystectomy. Large hiatal hernia containing the entire stomach and a portion of the nonobstructed transverse colon. LOWER CHEST: Left basilar atelectasis secondary to a large hiatal hernia. LIVER: Hepatic steatosis. GALLBLADDER AND BILE DUCTS: Cholecystectomy. No biliary ductal dilatation. SPLEEN: The spleen is unremarkable. PANCREAS: The pancreas is unremarkable. ADRENAL GLANDS: Bilateral adrenal glands demonstrate no acute abnormality. KIDNEYS, URETERS AND BLADDER: Nonobstructing nephrolithiasis. No hydronephrosis. No perinephric or  periureteral stranding. Urinary bladder is unremarkable. GI AND BOWEL: Stomach and duodenal sweep demonstrate no acute abnormality. There is no bowel obstruction. No abnormal bowel wall thickening. A few sigmoid diverticula without diverticulitis. Large hiatal hernia containing the entire stomach and a portion of the nonobstructed transverse colon. REPRODUCTIVE: Reproductive organs are unremarkable. PERITONEUM AND RETROPERITONEUM: No ascites or free air. LYMPH NODES: No lymphadenopathy. BONES AND SOFT TISSUES: No acute abnormality of the bones. No acute soft tissue abnormality. IMPRESSION: 1. No evidence of active GI bleeding. 2. Large hiatal hernia containing the entire stomach and a portion of the nonobstructed transverse colon. 3. A few sigmoid diverticula without diverticulitis. Electronically signed by: Norman Gatlin MD 07/31/2024 02:36 AM EDT RP Workstation: [id number]     Assessment   [name] is a 78 y.o. year old female with history of anxiety,arthritis, asthma, depression, gastric ulcer, GERD, HTN, IDA, neuropathy, Type 2 DM who presented to the ED with rectal bleeding x3 days with syncopal episode.  Hgb 6.1 on arrival. GI consulted for further evaluation  Anemia/rectal bleeding:  -onset Friday with weakness, syncopal episode yesterday  -notes black stools as well as BRBPR -hgb 6.1 on admission -9.2, now 8.6, trending down, despite improvement after PRBC infusion x2 -no abdominal pain, nausea or vomiting but endorses some weight loss, early satiety -last TCS In 2023 unremarkable, EGD in 2012 with PUD though secondary to NSAIDs at that time -denies NSAIDs or etoh  but is on 81mg  ASA and Eliquis, last dose reported to me was Monday night  -CT Angio GI bleed was negative  Given both BRBPR and Melena as well a significant anemia, recommend proceeding with EGD and Colonoscopy to evaluate for UGI and LGI sources of blood loss. Indications, risks and benefits of procedure discussed in  detail with patient. Patient verbalized understanding and is in agreement to proceed with EGD/colonoscopy.   Plan / Recommendations   Trend H&H Tranfuse as needed for hgb 7 or less  Monitor for overt GI bleeding Clear liquids, NPO Midnight  Prep today for EGD/Colonoscopy tomorrow Continue to Hold Eliquis    07/31/2024, 8:58 AM  Andriea Hasegawa L. Taneasha Fuqua, MSN, APRN,  AGNP-C Adult-Gerontology Nurse Practitioner [facility] at [facility]

## 2024-07-31 NOTE — Consult Note (Signed)
 Gastroenterology Consult   Referring Provider: No ref. provider found Primary Care Physician:  Hailey Rush, MD Primary Gastroenterologist:  Dr. Eartha   Patient ID: [name]; [id number]; 07-24-1946   Admit date: 07/30/2024  LOS: 1 day   Date of Consultation: 07/31/2024  Reason for Consultation:  anemia and rectal bleeding   History of Present Illness   [name] is a 78 y.o. year old female with history of anxiety,arthritis, asthma, depression, gastric ulcer, GERD, HTN, IDA, neuropathy, Type 2 DM who presented to the ED with rectal bleeding x3 days. Hgb 6.1 on arrival. GI consulted for further evaluation  ED course: VSS Hgb 6.1 CT Angio GI bleed with no evidence of active bleeding, large hh containing entire stomach, portion of non obstructed transverse colon, diverticula without diverticulitis  Consult: Patient reports some dizziness for the past few days, feeling more fatigued. She notes a small amount of BRB in the toilet and when wiping on Friday, noting a regular BM. She notes that she continued to notice BRBPR throughout the weekend until yesterday when she had a larger amount of blood and only a small amount of stool which was more like diarrhea. She denies any abdominal pain. Yesterday she noted more black color to her stool as well. She has had some nausea but no vomiting. No fevers or chills. Patient reports Last dose of xarelto  was Monday night. She denies any new meds or med changes other than some motion sickness medication yesterday for her dizziness. She has not had any BMs today. No changes to appetite. She has lost about 20 pounds this year, that was not due to any diet changes, though she does note she had knee surgery in February. Denies any NSAIDs. Denies any history of rectal bleeding or episodes of anemia. She does note some early satiety around the time of her knee surgery eariler this year. She has occasional reflux, maintained on omeprazole  which keeps  symptoms pretty well controlled. Occasionally will take tums PRN. Denies any dysphagia or odynophagia.   Last EGD 2012 moderate sized sliding hiatal hernia, erosive antral gastritis with two prepyoric ulcers, patent pylorus  Last Colonoscopy:05/2022- Diverticulosis in the sigmoid colon.                           - External hemorrhoids.                           - Anal papilla(e) were hypertrophied.                           - Biopsies were taken with a cold forceps from the                            right colon, descending colon and sigmoid colon for                            evaluation of microscopic colitis.    Past Medical History:  Diagnosis Date   Anxiety 12/29/2022   Arthritis    Asthma    Carpal tunnel syndrome    Depression    Dyspnea    Dysrhythmia    Gastric ulcer    GERD (gastroesophageal reflux disease)    H/O hiatal hernia    Heart murmur  since birth   History of heart murmur in childhood    History of kidney stones    Hypertension    Iron deficiency anemia    Neuropathy    Following neck surgery.   Paralysis (HCC)    PONV (postoperative nausea and vomiting)    Primary localized osteoarthritis of right knee 02/06/2024   Pulmonary embolus [facility])    November 2020   Type 2 diabetes mellitus [facility])     Past Surgical History:  Procedure Laterality Date   APPENDECTOMY     ATRIAL FIBRILLATION ABLATION N/A 10/09/2023   Procedure: ATRIAL FIBRILLATION ABLATION;  Surgeon: Kennyth Chew, MD;  Location: [facility] INVASIVE CV LAB;  Service: Cardiovascular;  Laterality: N/A;   BIOPSY  06/01/2022   Procedure: BIOPSY;  Surgeon: Golda Claudis PENNER, MD;  Location: [facility]O [facility];  Service: Endoscopy;;   CHOLECYSTECTOMY     COLONOSCOPY     COLONOSCOPY N/A 09/26/2013   Procedure: COLONOSCOPY;  Surgeon: Claudis PENNER Golda, MD;  Location: [facility];  Service: Endoscopy;  Laterality: N/A;  1030-rescheduled to 12:00pm Ann notified pt   COLONOSCOPY N/A 03/27/2015   Procedure: COLONOSCOPY;   Surgeon: Claudis PENNER Golda, MD;  Location: [facility];  Service: Endoscopy;  Laterality: N/A;  11:15 - moved to 8:25 - Ann to notify pt   COLONOSCOPY WITH PROPOFOL  N/A 06/01/2022   Procedure: COLONOSCOPY WITH PROPOFOL ;  Surgeon: Golda Claudis PENNER, MD;  [facility]: [facility];  Service: Endoscopy;  Laterality: N/A;  1005 ASA 2   CYSTOSCOPY/URETEROSCOPY/HOLMIUM LASER/STENT PLACEMENT Left 12/23/2022   Procedure: CYSTOSCOPY/ LEFT RETROGRADE/ URETEROSCOPY/HOLMIUM LASER/STENT PLACEMENT;  Surgeon: Nieves Cough, MD;  Location: [facility] ORS;  Service: Urology;  Laterality: Left;   ESOPHAGOGASTRODUODENOSCOPY  12/08/2011   Procedure: ESOPHAGOGASTRODUODENOSCOPY (EGD);  Surgeon: Claudis PENNER Golda, MD;  [facility]: [facility];  Service: Endoscopy;  Laterality: N/A;  3:00    ESOPHAGOGASTRODUODENOSCOPY N/A 03/27/2015   Procedure: ESOPHAGOGASTRODUODENOSCOPY (EGD);  Surgeon: Claudis PENNER Golda, MD;  [facility];  Service: Endoscopy;  Laterality: N/A;   EYE SURGERY Bilateral    cataract surgery   GIVENS CAPSULE STUDY N/A 11/04/2015   Procedure: GIVENS CAPSULE STUDY;  Surgeon: Claudis PENNER Golda, MD;  [facility]: [facility];  Service: Endoscopy;  Laterality: N/A;   Hysterscopy     KNEE ARTHROSCOPY     Left   LEFT HEART CATH AND CORONARY ANGIOGRAPHY N/A 05/30/2024   Procedure: LEFT HEART CATH AND CORONARY ANGIOGRAPHY;  Surgeon: Mady Bruckner, MD;  Location: [facility] INVASIVE CV LAB;  Service: Cardiovascular;  Laterality: N/A;   NECK SURGERY     x 2    TONSILLECTOMY     TOTAL KNEE ARTHROPLASTY  07/11/2012   Procedure: TOTAL KNEE ARTHROPLASTY;  Surgeon: Toribio JULIANNA Chancy, MD;  Location: [facility] OR;  Service: Orthopedics;  Laterality: Left;  left total knee arthroplasty   TOTAL KNEE ARTHROPLASTY Right 02/06/2024   Procedure: TOTAL KNEE ARTHROPLASTY;  Surgeon: Josefina Chew, MD;  Location: [facility] ORS;  Service: Orthopedics;  Laterality: Right;   UPPER GASTROINTESTINAL ENDOSCOPY     YAG LASER APPLICATION Right 02/21/2017    Procedure: YAG LASER APPLICATION;  Surgeon: Oneil Platts, MD;  Location: [facility] ORS;  Service: Ophthalmology;  Laterality: Right;    Prior to Admission medications   Medication Sig Start Date End Date Taking? Authorizing Provider  acetaminophen  (TYLENOL ) 500 MG tablet Take 1,000 mg by mouth every 6 (six) hours as needed for mild pain (pain score 1-3) or headache.   Yes [provider]  aspirin  EC 81 MG tablet Take 1 tablet (81 mg total) by mouth daily. Swallow whole. 05/31/24  Yes Leotis Bogus, MD  cholecalciferol  (VITAMIN D3) 25 MCG (1000 UNIT) tablet Take 1,000 Units by mouth daily.   Yes [provider]  cyanocobalamin (VITAMIN B12) 1000 MCG tablet Take 1,000 mcg by mouth daily.   Yes [provider]  dimenhyDRINATE (DRAMAMINE) 50 MG tablet Take 50 mg by mouth every 8 (eight) hours as needed for dizziness.   Yes [provider]  escitalopram  (LEXAPRO ) 20 MG tablet Take 20 mg by mouth daily. 05/23/24  Yes [provider]  ferrous sulfate  325 (65 FE) MG tablet Take 1 tablet (325 mg total) by mouth daily with breakfast. 10/25/16  Yes Rehman, Najeeb U, MD  furosemide  (LASIX ) 20 MG tablet TAKE 1 TABLET BY MOUTH TWICE DAILY.TAKE AN ADDITIONAL TABLET DAILY AS NEEDED FOR SHORTNESS OF BREATH 07/01/24  Yes Mallipeddi, Vishnu P, MD  gabapentin  (NEURONTIN ) 300 MG capsule Take 300 mg by mouth at bedtime.   Yes [provider]  KLOR-CON  M20 20 MEQ tablet TAKE 1 TABLET BY MOUTH EVERY DAY 05/02/24  Yes Mallipeddi, Vishnu P, MD  Lactobacillus (INTESTINEX PO) Take 1 capsule by mouth daily. Caplet   Yes [provider]  metFORMIN (GLUCOPHAGE) 500 MG tablet Take 500 mg by mouth 2 (two) times daily with a meal.    Yes [provider]  metoprolol  tartrate (LOPRESSOR ) 25 MG tablet Take 0.5 tablets (12.5 mg total) by mouth as needed (hr over 100 bpm). 03/07/24 03/02/25 Yes Mallipeddi, Vishnu P, MD  montelukast  (SINGULAIR ) 10 MG tablet Take 10 mg by mouth  every morning.   Yes [provider]  omeprazole  (PRILOSEC) 20 MG capsule Take 20 mg by mouth 2 (two) times daily before a meal.   Yes [provider]  Phenazopyrid-Cranbry-C-Probiot (AZO URINARY TRACT SUPPORT PO) Take 2 tablets by mouth daily.   Yes [provider]  Probiotic Product (PROBIOTIC DAILY PO) Take 1 capsule by mouth daily.   Yes [provider]  rivaroxaban  (XARELTO ) 20 MG TABS tablet Take 20 mg by mouth daily with supper.   Yes [provider]  Turmeric 450 MG CAPS Take 450 mg by mouth daily.   Yes [provider]  zolpidem  (AMBIEN ) 10 MG tablet Take 10 mg by mouth at bedtime as needed for sleep. 04/25/24  Yes [provider]    Current Facility-Administered Medications  Medication Dose Route Frequency Provider Last Rate Last Admin   0.9 %  sodium chloride  infusion (Manually program via Guardrails IV Fluids)   Intravenous Once Emokpae, Ejiroghene E, MD       acetaminophen  (TYLENOL ) tablet 650 mg  650 mg Oral Q6H PRN Emokpae, Ejiroghene E, MD       Or   acetaminophen  (TYLENOL ) suppository 650 mg  650 mg Rectal Q6H PRN Emokpae, Ejiroghene E, MD       fentaNYL  (SUBLIMAZE ) injection 12.5-50 mcg  12.5-50 mcg Intravenous Q2H PRN Opyd, Timothy S, MD       gabapentin  (NEURONTIN ) capsule 300 mg  300 mg Oral QHS Emokpae, Ejiroghene E, MD   300 mg at 07/30/24 2150   insulin  aspart (novoLOG ) injection 0-9 Units  0-9 Units Subcutaneous Q6H Emokpae, Ejiroghene E, MD       ondansetron  (ZOFRAN ) tablet 4 mg  4 mg Oral Q6H PRN Emokpae, Ejiroghene E, MD       Or   ondansetron  (ZOFRAN ) injection 4 mg  4 mg  Intravenous Q6H PRN Emokpae, Ejiroghene E, MD       oxyCODONE  (Oxy IR/ROXICODONE ) immediate release tablet 2.5-5 mg  2.5-5 mg Oral Q4H PRN Opyd, Evalene RAMAN, MD   5 mg at 07/31/24 0716   pantoprazole  (PROTONIX ) injection 40 mg  40 mg Intravenous Q12H Bernis Ernst, PA-C   40 mg at 07/31/24 0400   polyethylene glycol (MIRALAX  / GLYCOLAX )  packet 17 g  17 g Oral Daily PRN Emokpae, Ejiroghene E, MD        Allergies as of 07/30/2024 - Review Complete 07/30/2024  Allergen Reaction Noted   Cephalexin Other (See Comments) 12/08/2011   Nsaids Other (See Comments) 07/06/2012   Darvocet [propoxyphene n-acetaminophen ] Nausea Only 12/08/2011   Penicillins Rash 12/30/2009   Percocet [oxycodone -acetaminophen ] Nausea Only 10/27/2011    Family History  Problem Relation Age of Onset   Alzheimer's disease Mother    Kidney disease Mother    Kidney disease Father    Diabetes Maternal Grandmother    Diabetes Maternal Grandfather    Cancer Paternal Grandmother        breast, liver   Hypertension Son    Cancer Maternal Aunt        leukemia   Liver disease Maternal Uncle    Kidney disease Paternal Uncle        kidney removed   Colon cancer Neg Hx     Social History   Socioeconomic History   Marital status: Married    Spouse name: Not on file   Number of children: Not on file   Years of education: Not on file   Highest education level: Not on file  Occupational History   Not on file  Tobacco Use   Smoking status: Never    Passive exposure: Never   Smokeless tobacco: Never  Vaping Use   Vaping status: Never Used  Substance and Sexual Activity   Alcohol use: No    Alcohol/week: 0.0 standard drinks of alcohol   Drug use: No   Sexual activity: Not Currently    Birth control/protection: Post-menopausal  Other Topics Concern   Not on file  Social History Narrative   Not on file   Social Drivers of Health   Financial Resource Strain: Low Risk  (06/25/2021)   Overall Financial Resource Strain (CARDIA)    Difficulty of Paying Living Expenses: Not hard at all  Food Insecurity: No Food Insecurity (07/30/2024)   Hunger Vital Sign    Worried About Running Out of Food in the Last Year: Never true    Ran Out of Food in the Last Year: Never true  Transportation Needs: No Transportation Needs (07/30/2024)   PRAPARE -  Administrator, Civil Service (Medical): No    Lack of Transportation (Non-Medical): No  Physical Activity: Insufficiently Active (06/25/2021)   Exercise Vital Sign    Days of Exercise per Week: 2 days    Minutes of Exercise per Session: 20 min  Stress: No Stress Concern Present (06/25/2021)   [employer] of Occupational Health - Occupational Stress Questionnaire    Feeling of Stress : Not at all  Social Connections: Socially Integrated (07/30/2024)   Social Connection and Isolation Panel    Frequency of Communication with Friends and Family: More than three times a week    Frequency of Social Gatherings with Friends and Family: More than three times a week    Attends Religious Services: 1 to 4 times per year    Active Member of Clubs or  Organizations: Yes    Attends Engineer, structural: More than 4 times per year    Marital Status: Married  Catering manager Violence: Not At Risk (07/30/2024)   Humiliation, Afraid, Rape, and Kick questionnaire    Fear of Current or Ex-Partner: No    Emotionally Abused: No    Physically Abused: No    Sexually Abused: No     Review of Systems   Gen: Denies any fever, chills, loss of appetite +weight loss  CV: Denies chest pain, heart palpitations, syncope, edema  Resp: Denies shortness of breath with rest, cough, wheezing, coughing up blood, and pleurisy. GI: +weight loss +early satiety +melena +BRBPR GU : Denies urinary burning, blood in urine, urinary frequency, and urinary incontinence. MS: Denies joint pain, limitation of movement, swelling, cramps, and atrophy.  Derm: Denies rash, itching, dry skin, hives. Psych: Denies depression, anxiety, memory loss, hallucinations, and confusion. Heme: Denies bruising or bleeding Neuro:  Denies any headaches, dizziness, paresthesias, shaking  Physical Exam   Vital Signs in last 24 hours: Temp:  [97.8 F (36.6 C)-100 F (37.8 C)] 97.8 F (36.6 C) (08/06 0823) Pulse Rate:   [74-103] 74 (08/06 0823) Resp:  [13-21] 18 (08/06 0823) BP: (115-139)/(46-88) 115/49 (08/06 0823) SpO2:  [88 %-99 %] 93 % (08/06 0823) Weight:  [79.7 kg-80 kg] 79.7 kg (08/05 1910) Last BM Date : 07/30/24  General:   Alert,  Well-developed, well-nourished, pleasant and cooperative in NAD Head:  Normocephalic and atraumatic. Eyes:  Sclera clear, no icterus.   Conjunctiva pink. Ears:  Normal auditory acuity. Mouth:  No deformity or lesions, dentition normal. Neck:  Supple; no masses Lungs:  Clear throughout to auscultation.   No wheezes, crackles, or rhonchi. No acute distress. Heart:  Regular rate and rhythm; no murmurs, clicks, rubs,  or gallops. Abdomen:  Soft, nontender and nondistended. No masses, hepatosplenomegaly or hernias noted. Normal bowel sounds, without guarding, and without rebound.   Msk:  Symmetrical without gross deformities. Normal posture. Extremities:  Without clubbing or edema. Neurologic:  Alert and  oriented x4. Skin:  Intact without significant lesions or rashes. Psych:  Alert and cooperative. Normal mood and affect.   Labs/Studies   Recent Labs Recent Labs    07/30/24 1540 07/30/24 1630 07/31/24 0208 07/31/24 0534  WBC 7.9  --  7.6  --   HGB 6.1* 9.2* 8.4* 8.6*  HCT 19.0* 27.0* 25.4* 26.1*  PLT 322  --  261  --    BMET Recent Labs    07/30/24 1540 07/30/24 1630 07/31/24 0208  NA 140 140 141  K 3.6 3.8 3.9  CL 108 105 108  CO2 22  --  25  GLUCOSE 138* 133* 103*  BUN 19 16 13   CREATININE 0.87 0.90 0.81  CALCIUM 8.5*  --  8.5*   LFT Recent Labs    07/30/24 1540  PROT 5.3*  ALBUMIN 3.1*  AST 23  ALT 21  ALKPHOS 53  BILITOT 0.5   PT/INR Recent Labs    07/30/24 1540  LABPROT 14.9  INR 1.1   Hepatitis Panel No results for input(s): HEPBSAG, HCVAB, HEPAIGM, HEPBIGM in the last 72 hours. C-Diff No results for input(s): CDIFFTOX in the last 72 hours.  Radiology/Studies CT ANGIO GI BLEED Result Date: 07/31/2024 EXAM:  CTA ABDOMEN AND PELVIS WITH IV CONTRAST 07/31/2024 02:22:18 AM TECHNIQUE: CTA images of the abdomen and pelvis with intravenous contrast. Three-dimensional MIP/volume rendered formations were performed. Automated exposure control, iterative reconstruction, and/or weight based  adjustment of the mA/kV was utilized to reduce the radiation dose to as low as reasonably achievable. of iohexol  (OMNIPAQUE ) 350 MG/ML injection was used. COMPARISON: None available. CLINICAL HISTORY: Lower GI bleed, per GI recommendation. Patient complains of rectal bleeding x 3 days. Patient is on aspirin  and Xarelto . When EMS arrived, patient's blood pressure was 93/47 and they gave her 500cc of NS. FINDINGS: VASCULATURE: The aorta and its mesenteric, renal, and iliac artery branches are widely patent without hemodynamically significant stenosis, aneurysm, or dissection. GI BLEED: No evidence of active GI bleeding. AORTA: No acute finding. No abdominal aortic aneurysm. No dissection. CELIAC TRUNK: No acute finding. No occlusion or significant stenosis. SUPERIOR MESEMTRIC ARTERY: No acute finding. No occlusion or significant stenosis. INFERIOR MESENTERIC ARTERY: No acute finding. No occlusion or significant stenosis. RENAL ARTERIES: No acute finding. No occlusion or significant stenosis. ILIAC ARTERIES: No acute finding. No occlusion or significant stenosis. ABDOMEN/PELVIS: Hepatic steatosis. Cholecystectomy. Large hiatal hernia containing the entire stomach and a portion of the nonobstructed transverse colon. LOWER CHEST: Left basilar atelectasis secondary to a large hiatal hernia. LIVER: Hepatic steatosis. GALLBLADDER AND BILE DUCTS: Cholecystectomy. No biliary ductal dilatation. SPLEEN: The spleen is unremarkable. PANCREAS: The pancreas is unremarkable. ADRENAL GLANDS: Bilateral adrenal glands demonstrate no acute abnormality. KIDNEYS, URETERS AND BLADDER: Nonobstructing nephrolithiasis. No hydronephrosis. No perinephric or  periureteral stranding. Urinary bladder is unremarkable. GI AND BOWEL: Stomach and duodenal sweep demonstrate no acute abnormality. There is no bowel obstruction. No abnormal bowel wall thickening. A few sigmoid diverticula without diverticulitis. Large hiatal hernia containing the entire stomach and a portion of the nonobstructed transverse colon. REPRODUCTIVE: Reproductive organs are unremarkable. PERITONEUM AND RETROPERITONEUM: No ascites or free air. LYMPH NODES: No lymphadenopathy. BONES AND SOFT TISSUES: No acute abnormality of the bones. No acute soft tissue abnormality. IMPRESSION: 1. No evidence of active GI bleeding. 2. Large hiatal hernia containing the entire stomach and a portion of the nonobstructed transverse colon. 3. A few sigmoid diverticula without diverticulitis. Electronically signed by: Norman Gatlin MD 07/31/2024 02:36 AM EDT RP Workstation: [id number]     Assessment   [name] is a 78 y.o. year old female with history of anxiety,arthritis, asthma, depression, gastric ulcer, GERD, HTN, IDA, neuropathy, Type 2 DM who presented to the ED with rectal bleeding x3 days with syncopal episode.  Hgb 6.1 on arrival. GI consulted for further evaluation  Anemia/rectal bleeding:  -onset Friday with weakness, syncopal episode yesterday  -notes black stools as well as BRBPR -hgb 6.1 on admission -9.2, now 8.6, trending down, despite improvement after PRBC infusion x2 -no abdominal pain, nausea or vomiting but endorses some weight loss, early satiety -last TCS In 2023 unremarkable, EGD in 2012 with PUD though secondary to NSAIDs at that time -denies NSAIDs or etoh  but is on 81mg  ASA and Eliquis, last dose reported to me was Monday night  -CT Angio GI bleed was negative  Given both BRBPR and Melena as well a significant anemia, recommend proceeding with EGD and Colonoscopy to evaluate for UGI and LGI sources of blood loss. Indications, risks and benefits of procedure discussed in  detail with patient. Patient verbalized understanding and is in agreement to proceed with EGD/colonoscopy.   Plan / Recommendations   Trend H&H Tranfuse as needed for hgb 7 or less  Monitor for overt GI bleeding Clear liquids, NPO Midnight  Prep today for EGD/Colonoscopy tomorrow Continue to Hold Eliquis    07/31/2024, 8:58 AM  Andriea Hasegawa L. Taneasha Fuqua, MSN, APRN,  AGNP-C Adult-Gerontology Nurse Practitioner [facility] at [facility]

## 2024-07-31 NOTE — TOC CM/SW Note (Signed)
 Transition of Care [facility]) - Inpatient Brief Assessment   Patient Details  Name: [name] MRN: [id number] Date of Birth: 19-Apr-1946  Transition of Care [facility]) CM/SW Contact:    Noreen KATHEE Cleotilde ISRAEL Phone Number: 07/31/2024, 9:36 AM   Clinical Narrative:   Transition of Care Department [facility]) has reviewed patient and no TOC needs have been identified at this time. We will continue to monitor patient advancement through interdisciplinary progression rounds. If new patient transition needs arise, please place a TOC consult.  Transition of Care Asessment: Insurance and Status: Insurance coverage has been reviewed Patient has primary care physician: Yes Home environment has been reviewed: Single Family Home Prior level of function:: Independent Prior/Current Home Services: No current home services Social Drivers of Health Review: SDOH reviewed no interventions necessary Readmission risk has been reviewed: Yes Transition of care needs: no transition of care needs at this time

## 2024-07-31 NOTE — Plan of Care (Signed)

## 2024-08-01 ENCOUNTER — Other Ambulatory Visit: Payer: Self-pay

## 2024-08-01 ENCOUNTER — Inpatient Hospital Stay (HOSPITAL_COMMUNITY)

## 2024-08-01 ENCOUNTER — Encounter (HOSPITAL_COMMUNITY): Payer: Self-pay | Admitting: Internal Medicine

## 2024-08-01 ENCOUNTER — Encounter (HOSPITAL_COMMUNITY)
Admission: EM | Disposition: A | Discharge status: Home / Self Care | Payer: Self-pay | Source: Home / Self Care | Attending: Internal Medicine

## 2024-08-01 DIAGNOSIS — K317 Polyp of stomach and duodenum: Secondary | ICD-10-CM

## 2024-08-01 DIAGNOSIS — K253 Acute gastric ulcer without hemorrhage or perforation: Secondary | ICD-10-CM

## 2024-08-01 DIAGNOSIS — K5730 Diverticulosis of large intestine without perforation or abscess without bleeding: Secondary | ICD-10-CM | POA: Diagnosis not present

## 2024-08-01 DIAGNOSIS — K648 Other hemorrhoids: Secondary | ICD-10-CM

## 2024-08-01 DIAGNOSIS — K449 Diaphragmatic hernia without obstruction or gangrene: Secondary | ICD-10-CM

## 2024-08-01 DIAGNOSIS — D62 Acute posthemorrhagic anemia: Secondary | ICD-10-CM

## 2024-08-01 DIAGNOSIS — K625 Hemorrhage of anus and rectum: Secondary | ICD-10-CM

## 2024-08-01 DIAGNOSIS — K2970 Gastritis, unspecified, without bleeding: Secondary | ICD-10-CM

## 2024-08-01 DIAGNOSIS — I480 Paroxysmal atrial fibrillation: Secondary | ICD-10-CM | POA: Diagnosis not present

## 2024-08-01 DIAGNOSIS — K254 Chronic or unspecified gastric ulcer with hemorrhage: Principal | ICD-10-CM

## 2024-08-01 DIAGNOSIS — I5032 Chronic diastolic (congestive) heart failure: Secondary | ICD-10-CM | POA: Diagnosis not present

## 2024-08-01 DIAGNOSIS — K31819 Angiodysplasia of stomach and duodenum without bleeding: Secondary | ICD-10-CM

## 2024-08-01 DIAGNOSIS — K921 Melena: Secondary | ICD-10-CM

## 2024-08-01 DIAGNOSIS — K2950 Unspecified chronic gastritis without bleeding: Secondary | ICD-10-CM

## 2024-08-01 DIAGNOSIS — I10 Essential (primary) hypertension: Secondary | ICD-10-CM

## 2024-08-01 DIAGNOSIS — E118 Type 2 diabetes mellitus with unspecified complications: Secondary | ICD-10-CM | POA: Diagnosis not present

## 2024-08-01 HISTORY — PX: ESOPHAGOGASTRODUODENOSCOPY: SHX5428

## 2024-08-01 HISTORY — PX: COLONOSCOPY: SHX5424

## 2024-08-01 LAB — CBC
HCT: 25.4 % — ABNORMAL LOW (ref 36.0–46.0)
HCT: 27.8 % — ABNORMAL LOW (ref 36.0–46.0)
Hemoglobin: 8.3 g/dL — ABNORMAL LOW (ref 12.0–15.0)
Hemoglobin: 8.9 g/dL — ABNORMAL LOW (ref 12.0–15.0)
MCH: 30.5 pg (ref 26.0–34.0)
MCH: 30.5 pg (ref 26.0–34.0)
MCHC: 32.7 g/dL (ref 30.0–36.0)
MCHC: 32 g/dL (ref 30.0–36.0)
MCV: 93.4 fL (ref 80.0–100.0)
MCV: 95.2 fL (ref 80.0–100.0)
Platelets: 270 K/uL (ref 150–400)
Platelets: 303 K/uL (ref 150–400)
RBC: 2.72 MIL/uL — ABNORMAL LOW (ref 3.87–5.11)
RBC: 2.92 MIL/uL — ABNORMAL LOW (ref 3.87–5.11)
RDW: 16.1 % — ABNORMAL HIGH (ref 11.5–15.5)
RDW: 16.2 % — ABNORMAL HIGH (ref 11.5–15.5)
WBC: 6.3 K/uL (ref 4.0–10.5)
WBC: 7.3 K/uL (ref 4.0–10.5)
nRBC: 0 % (ref 0.0–0.2)
nRBC: 0 % (ref 0.0–0.2)

## 2024-08-01 LAB — BASIC METABOLIC PANEL WITH GFR
Anion gap: 4 — ABNORMAL LOW (ref 5–15)
BUN: 6 mg/dL — ABNORMAL LOW (ref 8–23)
CO2: 24 mmol/L (ref 22–32)
Calcium: 9 mg/dL (ref 8.9–10.3)
Chloride: 110 mmol/L (ref 98–111)
Creatinine, Ser: 0.78 mg/dL (ref 0.44–1.00)
GFR, Estimated: >60 mL/min (ref >60)
Glucose, Bld: 109 mg/dL — ABNORMAL HIGH (ref 70–99)
Potassium: 4 mmol/L (ref 3.5–5.1)
Sodium: 138 mmol/L (ref 135–145)

## 2024-08-01 LAB — GLUCOSE, CAPILLARY
Glucose-Capillary: 100 mg/dL — ABNORMAL HIGH (ref 70–99)
Glucose-Capillary: 114 mg/dL — ABNORMAL HIGH (ref 70–99)
Glucose-Capillary: 108 mg/dL — ABNORMAL HIGH (ref 70–99)
Glucose-Capillary: 119 mg/dL — ABNORMAL HIGH (ref 70–99)
Glucose-Capillary: 128 mg/dL — ABNORMAL HIGH (ref 70–99)
Glucose-Capillary: 98 mg/dL (ref 70–99)

## 2024-08-01 SURGERY — EGD (ESOPHAGOGASTRODUODENOSCOPY)
Anesthesia: General

## 2024-08-01 MED ORDER — LACTATED RINGERS IV SOLN: INTRAVENOUS | Status: DC | PRN

## 2024-08-01 MED ORDER — PHENYLEPHRINE 80 MCG/ML (10ML) SYRINGE FOR IV PUSH (FOR BLOOD PRESSURE SUPPORT)
PREFILLED_SYRINGE | INTRAVENOUS | Status: DC | PRN
  Administered 2024-08-01 (×2): 120 ug via INTRAVENOUS
  Administered 2024-08-01: 80 ug via INTRAVENOUS
  Administered 2024-08-01: 120 ug via INTRAVENOUS
  Administered 2024-08-01: 80 ug via INTRAVENOUS

## 2024-08-01 MED ORDER — LIDOCAINE 2% (20 MG/ML) 5 ML SYRINGE
INTRAMUSCULAR | Status: DC | PRN
  Administered 2024-08-01: 100 mg via INTRAVENOUS

## 2024-08-01 MED ORDER — SIMETHICONE 40 MG/0.6ML PO SUSP
ORAL | Status: DC | PRN
  Administered 2024-08-01: 120 mL

## 2024-08-01 MED ORDER — INSULIN ASPART 100 UNIT/ML IJ SOLN
0.0000 [IU] | Freq: Three times a day (TID) | INTRAMUSCULAR | Status: DC
  Administered 2024-08-01: 1 [IU] via SUBCUTANEOUS

## 2024-08-01 MED ORDER — PROPOFOL 500 MG/50ML IV EMUL
INTRAVENOUS | Status: DC | PRN
  Administered 2024-08-01: 150 ug/kg/min via INTRAVENOUS

## 2024-08-01 MED ORDER — DEXMEDETOMIDINE HCL IN NACL 80 MCG/20ML IV SOLN
INTRAVENOUS | Status: DC | PRN
  Administered 2024-08-01 (×2): 10 ug via INTRAVENOUS

## 2024-08-01 MED ORDER — LACTATED RINGERS IV SOLN: INTRAVENOUS | Status: DC

## 2024-08-01 MED ORDER — PROPOFOL 10 MG/ML IV BOLUS
INTRAVENOUS | Status: DC | PRN
  Administered 2024-08-01: 30 mg via INTRAVENOUS
  Administered 2024-08-01: 40 mg via INTRAVENOUS
  Administered 2024-08-01: 30 mg via INTRAVENOUS

## 2024-08-01 NOTE — Op Note (Signed)
 [facility] Patient Name: [name] Procedure Date: 08/01/2024 9:05 AM MRN: [id number] Date of Birth: 04/11/1946 Attending MD: Deatrice Dine , MD, [id number] CSN: [id number] Age: 78 Admit Type: Inpatient Procedure:                Colonoscopy Indications:              Acute post hemorrhagic anemia Providers:                Deatrice Dine, MD, Devere Lodge, Jon Loge Referring MD:              Medicines:                Monitored Anesthesia Care Complications:            No immediate complications. Estimated Blood Loss:     Estimated blood loss: none. Procedure:                Pre-Anesthesia Assessment:                           - Prior to the procedure, a History and Physical                            was performed, and patient medications and                            allergies were reviewed. The patient's tolerance of                            previous anesthesia was also reviewed. The risks                            and benefits of the procedure and the sedation                            options and risks were discussed with the patient.                            All questions were answered, and informed consent                            was obtained. Prior Anticoagulants: The patient has                            taken Eliquis (apixaban), last dose was 2 days                            prior to procedure. ASA Grade Assessment: III - A                            patient with severe systemic disease. After                            reviewing the risks and benefits, the patient was  deemed in satisfactory condition to undergo the                            procedure.                           After obtaining informed consent, the colonoscope                            was passed under direct vision. Throughout the                            procedure, the patient's blood pressure, pulse, and                            oxygen saturations  were monitored continuously. The                            [id number]) scope was introduced through the                            anus and advanced to the the cecum, identified by                            appendiceal orifice and ileocecal valve. The                            quality of the bowel preparation was evaluated                            using the BBPS [facility] Bowel Preparation Scale)                            with scores of: Right Colon = 3, Transverse Colon =                            3 and Left Colon = 3 (entire mucosa seen well with                            no residual staining, small fragments of stool or                            opaque liquid). The total BBPS score equals 9. The                            colonoscopy was performed without difficulty. The                            patient tolerated the procedure well. Scope In: 9:48:47 AM Scope Out: 10:03:41 AM Scope Withdrawal Time: 0 hours 7 minutes 33 seconds  Total Procedure Duration: 0 hours 14 minutes 54 seconds  Findings:      The perianal and digital rectal examinations were normal.      There is no  endoscopic evidence of bleeding in the entire colon.      A few small-mouthed diverticula were found in the left colon.      Non-bleeding internal hemorrhoids were found during endoscopy. The       hemorrhoids were small. Impression:               - Diverticulosis in the left colon.                           - Non-bleeding internal hemorrhoids.                           - No specimens collected. Moderate Sedation:      Per Anesthesia Care Recommendation:           - Advance diet as tolerated.                           - Continue present medications.                           - No repeat colonoscopy due to current age (6                            years or older). Procedure Code(s):        --- Professional ---                           [phone], Colonoscopy, flexible; diagnostic, including                             collection of specimen(s) by brushing or washing,                            when performed (separate procedure) Diagnosis Code(s):        --- Professional ---                           K64.8, Other hemorrhoids                           D62, Acute posthemorrhagic anemia                           K57.30, Diverticulosis of large intestine without                            perforation or abscess without bleeding CPT copyright 2022 American Medical Association. All rights reserved. The codes documented in this report are preliminary and upon coder review may  be revised to meet current compliance requirements. Deatrice Dine, MD Deatrice Dine, MD 08/01/2024 10:15:17 AM This report has been signed electronically. Number of Addenda: 0

## 2024-08-01 NOTE — Anesthesia Preprocedure Evaluation (Addendum)
 Anesthesia Evaluation  Patient identified by MRN, date of birth, ID band Patient awake    Reviewed: Allergy & Precautions, H&P , NPO status , Patient's Chart, lab work & pertinent test results  History of Anesthesia Complications (+) PONV, DIFFICULT AIRWAY and history of anesthetic complications  Airway Mallampati: III  TM Distance: >3 FB Neck ROM: Limited    Dental no notable dental hx.    Pulmonary shortness of breath, asthma , sleep apnea    Pulmonary exam normal breath sounds clear to auscultation       Cardiovascular hypertension, + DOE  Normal cardiovascular exam+ dysrhythmias Atrial Fibrillation + Valvular Problems/Murmurs  Rhythm:Regular Rate:Normal  EF 60-65% 12 lead today NSR   Neuro/Psych  PSYCHIATRIC DISORDERS Anxiety Depression     Neuromuscular disease    GI/Hepatic Neg liver ROS, hiatal hernia, PUD,GERD  ,,  Endo/Other  diabetes    Renal/GU Renal disease  negative genitourinary   Musculoskeletal  (+) Arthritis ,    Abdominal   Peds negative pediatric ROS (+)  Hematology  (+) Blood dyscrasia, anemia   Anesthesia Other Findings   Reproductive/Obstetrics negative OB ROS                              Anesthesia Physical Anesthesia Plan  ASA: 3  Anesthesia Plan: General   Post-op Pain Management:    Induction: Intravenous  PONV Risk Score and Plan:   Airway Management Planned: Nasal Cannula  Additional Equipment:   Intra-op Plan:   Post-operative Plan:   Informed Consent: I have reviewed the patients History and Physical, chart, labs and discussed the procedure including the risks, benefits and alternatives for the proposed anesthesia with the patient or authorized representative who has indicated his/her understanding and acceptance.     Dental advisory given  Plan Discussed with: CRNA  Anesthesia Plan Comments:          Anesthesia Quick  Evaluation

## 2024-08-01 NOTE — Interval H&P Note (Signed)
 History and Physical Interval Note:  08/01/2024 9:01 AM  Webster LELON Sor  has presented today for surgery, with the diagnosis of rectal bleeding, melena and anemia.  The various methods of treatment have been discussed with the patient and family. After consideration of risks, benefits and other options for treatment, the patient has consented to  Procedure(s): EGD (ESOPHAGOGASTRODUODENOSCOPY) (N/A) COLONOSCOPY (N/A) as a surgical intervention.  The patient's history has been reviewed, patient examined, no change in status, stable for surgery.  I have reviewed the patient's chart and labs.  Questions were answered to the patient's satisfaction.    We will proceed with EGD/Colonoscopy as scheduled.    I thoroughly discussed with the patient the procedure, including the risks involved. Patient understands what the procedure involves including the benefits and any risks. Patient understands alternatives to the proposed procedure. Risks including (but not limited to) bleeding, tearing of the lining (perforation), rupture of adjacent organs, problems with heart and lung function, infection, and medication reactions. A small percentage of complications may require surgery, hospitalization, repeat endoscopic procedure, and/or transfusion.  Patient understood and agreed.    Deatrice FALCON Reichen Hutzler

## 2024-08-01 NOTE — Progress Notes (Addendum)
 Progress Note   Patient: [name] FMW:[id number] DOB: 1946/03/01 DOA: 07/30/2024     2 DOS: the patient was seen and examined on 08/01/2024   Brief hospital course: 78 y o woman with PMH of atrial fibrillation on anticoagulation, hypertension, diabetes mellitus, asthma, CHF, PUD who presented to the ED with BRBPR. Gastroenterology was consulted. CTA showed no acute bleed. Plan for EGD, colonoscopy today.   Assessment and Plan:   Acute GI bleeding Bloody stools of 3 days duration, likely lower GI bleed.  No vomiting, abdominal pain. On Xarelto  and aspirin .  Denies NSAID use.  Last colonoscopy 2023-sigmoid diverticulosis, external hemorrhoids. CTA done no acute bleeding. S/p EGD - Cameron ulcer, hiatal hernia, multiple gastric polyps.non bleeding angio ectasia in duodenum treated with APC. S/p colonoscopy - diverticulosis, non bleeding internal hemorrhoids. GI advised to advance diet, xarelto  if H/H stable.   Acute blood loss anemia.  Presented with Hgb of 6.1. Baseline Hb around 12. Received 2 units PRBCs. Repeat Hb 8.3. Continue to monitor H/H. Will transfuse to maintain Hgb >7   PAF (paroxysmal atrial fibrillation) (HCC) Controlled. Hold Xarelto  in the setting of acute GI bleed.   Chronic diastolic heart failure (HCC) Stable and Compensated.  Recent echo 05/2024 EF 60 to 65%, Hold home Lasix  20 mg BID   Controlled type 2 diabetes mellitus (HCC) Controlled, A1c 6.3- 12/2023 Continue accucheck , sliding scale insulin  ACHS Hold home metformin   Asthma Stable.   Essential hypertension Stable-systolic 118-130.  EMS report blood pressure 93/47. Hold metoprolol  12.5 mg, Lasix  20 mg    Out of bed to chair. Incentive spirometry. Nursing supportive care. Fall, aspiration precautions. Diet:  Diet Orders (From admission, onward)     Start     Ordered   08/01/24 1240  DIET SOFT Room service appropriate? Yes; Fluid consistency: Thin  Diet effective now       Question Answer  Comment  Room service appropriate? Yes   Fluid consistency: Thin      08/01/24 1239           DVT prophylaxis: SCDs Start: 07/30/24 1911  Level of care: Telemetry   Code Status: Full Code  Subjective: Patient is seen and examined today morning after EGD, Colonoscopy. She is sitting on edge of bed, family at bedside. Wishes to eat, no complaints.  Physical Exam: Vitals:   08/01/24 1015 08/01/24 1022 08/01/24 1028 08/01/24 1235  BP: (!) 106/35 (!) 107/54  (!) 123/51  Pulse: 72 71 73 70  Resp: 17 (!) 24 17   Temp:   98.8 F (37.1 C) 98.4 F (36.9 C)  TempSrc:    Oral  SpO2: 95% 97% 97% 98%  Weight:      Height:        General - Elderly Caucasian female, no apparent distress HEENT - PERRLA, EOMI, atraumatic head, non tender sinuses. Lung - Clear, diffuse rales, rhonchi, wheezes. Heart - S1, S2 heard, no murmurs, rubs, trace pedal edema. Abdomen - Soft, non tender, bowel sounds good Neuro - Alert, awake and oriented x 3, non focal exam. Skin - Warm and dry.  Data Reviewed:      Latest Ref Rng & Units 08/01/2024    4:51 AM 07/31/2024    5:31 PM 07/31/2024    5:34 AM  CBC  WBC 4.0 - 10.5 K/uL 6.3  8.9    Hemoglobin 12.0 - 15.0 g/dL 8.3  9.8  8.6   Hematocrit 36.0 - 46.0 % 25.4  31.3  26.1   Platelets 150 - 400 K/uL 270  307        Latest Ref Rng & Units 08/01/2024    4:51 AM 07/31/2024    2:08 AM 07/30/2024    4:30 PM  BMP  Glucose 70 - 99 mg/dL 890  896  866   BUN 8 - 23 mg/dL 6  13  16    Creatinine 0.44 - 1.00 mg/dL 9.21  9.18  9.09   Sodium 135 - 145 mmol/L 138  141  140   Potassium 3.5 - 5.1 mmol/L 4.0  3.9  3.8   Chloride 98 - 111 mmol/L 110  108  105   CO2 22 - 32 mmol/L 24  25    Calcium 8.9 - 10.3 mg/dL 9.0  8.5     CT ANGIO GI BLEED Result Date: 07/31/2024 EXAM: CTA ABDOMEN AND PELVIS WITH IV CONTRAST 07/31/2024 02:22:18 AM TECHNIQUE: CTA images of the abdomen and pelvis with intravenous contrast. Three-dimensional MIP/volume rendered formations were  performed. Automated exposure control, iterative reconstruction, and/or weight based adjustment of the mA/kV was utilized to reduce the radiation dose to as low as reasonably achievable. of iohexol  (OMNIPAQUE ) 350 MG/ML injection was used. COMPARISON: None available. CLINICAL HISTORY: Lower GI bleed, per GI recommendation. Patient complains of rectal bleeding x 3 days. Patient is on aspirin  and Xarelto . When EMS arrived, patient's blood pressure was 93/47 and they gave her 500cc of NS. FINDINGS: VASCULATURE: The aorta and its mesenteric, renal, and iliac artery branches are widely patent without hemodynamically significant stenosis, aneurysm, or dissection. GI BLEED: No evidence of active GI bleeding. AORTA: No acute finding. No abdominal aortic aneurysm. No dissection. CELIAC TRUNK: No acute finding. No occlusion or significant stenosis. SUPERIOR MESEMTRIC ARTERY: No acute finding. No occlusion or significant stenosis. INFERIOR MESENTERIC ARTERY: No acute finding. No occlusion or significant stenosis. RENAL ARTERIES: No acute finding. No occlusion or significant stenosis. ILIAC ARTERIES: No acute finding. No occlusion or significant stenosis. ABDOMEN/PELVIS: Hepatic steatosis. Cholecystectomy. Large hiatal hernia containing the entire stomach and a portion of the nonobstructed transverse colon. LOWER CHEST: Left basilar atelectasis secondary to a large hiatal hernia. LIVER: Hepatic steatosis. GALLBLADDER AND BILE DUCTS: Cholecystectomy. No biliary ductal dilatation. SPLEEN: The spleen is unremarkable. PANCREAS: The pancreas is unremarkable. ADRENAL GLANDS: Bilateral adrenal glands demonstrate no acute abnormality. KIDNEYS, URETERS AND BLADDER: Nonobstructing nephrolithiasis. No hydronephrosis. No perinephric or periureteral stranding. Urinary bladder is unremarkable. GI AND BOWEL: Stomach and duodenal sweep demonstrate no acute abnormality. There is no bowel obstruction. No abnormal bowel wall thickening. A  few sigmoid diverticula without diverticulitis. Large hiatal hernia containing the entire stomach and a portion of the nonobstructed transverse colon. REPRODUCTIVE: Reproductive organs are unremarkable. PERITONEUM AND RETROPERITONEUM: No ascites or free air. LYMPH NODES: No lymphadenopathy. BONES AND SOFT TISSUES: No acute abnormality of the bones. No acute soft tissue abnormality. IMPRESSION: 1. No evidence of active GI bleeding. 2. Large hiatal hernia containing the entire stomach and a portion of the nonobstructed transverse colon. 3. A few sigmoid diverticula without diverticulitis. Electronically signed by: Norman Gatlin MD 07/31/2024 02:36 AM EDT RP Workstation: [id number]    Family Communication: Discussed with patient, family at bedside. They understand and agree. All questions answered.  Disposition: Status is: Inpatient Remains inpatient appropriate because: monitor H/H, on anticoagulation.  Planned Discharge Destination: Home     Time spent: 42 minutes  Author: Concepcion Riser, MD 08/01/2024 12:40 PM Secure chat 7am to 7pm For on call review [email or web address].

## 2024-08-01 NOTE — Op Note (Signed)
 [facility] Patient Name: [name] Procedure Date: 08/01/2024 9:09 AM MRN: [id number] Date of Birth: [birth year] Attending MD: Deatrice Dine , MD, [id number] CSN: [id number] Age: 78 Admit Type: Inpatient Procedure:                Upper GI endoscopy Indications:              Acute post hemorrhagic anemia Providers:                Deatrice Dine, MD, Devere Lodge, Jon Loge Referring MD:              Medicines:                Monitored Anesthesia Care Complications:            No immediate complications. Estimated Blood Loss:     Estimated blood loss was minimal. Procedure:                Pre-Anesthesia Assessment:                           - Prior to the procedure, a History and Physical                            was performed, and patient medications and                            allergies were reviewed. The patient's tolerance of                            previous anesthesia was also reviewed. The risks                            and benefits of the procedure and the sedation                            options and risks were discussed with the patient.                            All questions were answered, and informed consent                            was obtained. Prior Anticoagulants: The patient has                            taken Eliquis (apixaban), last dose was 2 days                            prior to procedure. ASA Grade Assessment: III - A                            patient with severe systemic disease. After                            reviewing the risks and benefits, the patient was  deemed in satisfactory condition to undergo the                            procedure.                           After obtaining informed consent, the endoscope was                            passed under direct vision. Throughout the                            procedure, the patient's blood pressure, pulse, and                            oxygen  saturations were monitored continuously. The                            [id number] ([id number]) scope was introduced through the                            mouth, and advanced to the second part of duodenum.                            The upper GI endoscopy was accomplished without                            difficulty. The patient tolerated the procedure                            well. Scope In: 9:27:16 AM Scope Out: 9:42:25 AM Total Procedure Duration: 0 hours 15 minutes 9 seconds  Findings:      The Z-line was regular.      A 10 cm hiatal hernia with a single Cameron ulcer was found. The hiatal       narrowing was 42 cm from the incisors. The Z-line was 32 cm from the       incisors.      Multiple 4 to 8 mm sessile polyps with no bleeding and no stigmata of       recent bleeding were found in the gastric antrum. The polyp was removed       with a cold snare. Resection and retrieval were complete.      Mildly erythematous mucosa without bleeding was found in the gastric       antrum. This was biopsied with a cold forceps for histology.      A single small angioectasia without bleeding was found in the second       portion of the duodenum. Coagulation for bleeding prevention using argon       plasma at 0.3 liters/minute and 20 watts was successful.      -Bleeding cameron lesion in the hiatal sac , s/p APC Impression:               - Z-line regular.                           - 10 cm  hiatal hernia with a single Cameron ulcer.                           - Multiple gastric polyps. Resected and retrieved.                            representative polyp removed only                           - Erythematous mucosa in the antrum. Biopsied.                           - A single non-bleeding angioectasia in the                            duodenum. Treated with argon plasma coagulation                            (APC).                           -Bleeding cameron lesion s/p APC .                            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 Likely cause of anemia is cameron lesions in                            setting of anticoagulation and antiplatelet use Moderate Sedation:      Per Anesthesia Care Recommendation:           Advance diet as tolerated                           Patient would benefit from surgical evaluation for                            large hiatal hernia repair                           Iron supplementation and PPI                           If polyp removed in stomach is HP or TA would                            recommend removal of all polyps                           Follow up biopsy results  No absolute GI contraindication to restart                            clinically indicated anticoagulation while patient                            is closely monitored Procedure Code(s):        --- Professional ---                           43255, 59, Esophagogastroduodenoscopy, flexible,                            transoral; with control of bleeding, any method                           43251, Esophagogastroduodenoscopy, flexible,                            transoral; with removal of tumor(s), polyp(s), or                            other lesion(s) by snare technique                           43239, 59, Esophagogastroduodenoscopy, flexible,                            transoral; with biopsy, single or multiple Diagnosis Code(s):        --- Professional ---                           K44.9, Diaphragmatic hernia without obstruction or                            gangrene                           K25.9, Gastric ulcer, unspecified as acute or                            chronic, without hemorrhage or perforation                           K31.7, Polyp of stomach and duodenum                           K31.89, Other diseases of  stomach and duodenum                           K31.819, Angiodysplasia of stomach and duodenum                            without bleeding                           D62, Acute posthemorrhagic anemia CPT copyright 2022 American  Medical Association. All rights reserved. The codes documented in this report are preliminary and upon coder review may  be revised to meet current compliance requirements. Deatrice Dine, MD Deatrice Dine, MD 08/01/2024 10:12:30 AM This report has been signed electronically. Number of Addenda: 0

## 2024-08-01 NOTE — Brief Op Note (Signed)
 Patient underwent Egd and colonoscopy  under propofol  sedation.  Tolerated the procedure adequately.   EGD/Colonoscopy  FINDINGS:  - Z- line regular. - 10 cm hiatal hernia with a single Cameron ulcer. - Multiple gastric polyps. Resected and retrieved. representative polyp removed only - Erythematous mucosa in the antrum. Biopsied. - A single non- bleeding angioectasia in the duodenum. Treated with argon plasma coagulation ( APC) . - Bleeding cameron lesion s/ p APC .   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 Likely cause of anemia is cameron lesions in setting of anticoagulation and antiplatelet use  - Diverticulosis in the left colon. - Non- bleeding internal hemorrhoids. - No specimens collected.  RECOMMENDATIONS  Advance diet as tolerated   Patient would benefit from surgical evaluation for large hiatal hernia repair   Iron supplementation and PPI   If polyp removed in stomach is HP or TA would recommend removal of all polyps   Follow up biopsy results   No absolute GI contraindication to restart clinically indicated anticoagulation while patient is closely monitored  Breyden Jeudy Faizan Desta Bujak, MD Gastroenterology and Hepatology [facility] Gastroenterology

## 2024-08-01 NOTE — Transfer of Care (Signed)
 Immediate Anesthesia Transfer of Care Note  Patient: [name]  Procedure(s) Performed: EGD (ESOPHAGOGASTRODUODENOSCOPY) COLONOSCOPY  Patient Location: PACU  Anesthesia Type:General  Level of Consciousness: drowsy  Airway & Oxygen Therapy: Patient Spontanous Breathing  Post-op Assessment: Report given to RN and Post -op Vital signs reviewed and stable  Post vital signs: Reviewed and stable  Last Vitals:  Vitals Value Taken Time  BP 100/47   Temp 98.5   Pulse 64   Resp 18   SpO2 97%     Last Pain:  Vitals:   08/01/24 0921  TempSrc:   PainSc: 3          Complications: No notable events documented.

## 2024-08-01 NOTE — Anesthesia Postprocedure Evaluation (Signed)
 Anesthesia Post Note  Patient: [name]  Procedure(s) Performed: EGD (ESOPHAGOGASTRODUODENOSCOPY) COLONOSCOPY  Patient location during evaluation: PACU Anesthesia Type: General Level of consciousness: awake and alert Pain management: pain level controlled Vital Signs Assessment: post-procedure vital signs reviewed and stable Respiratory status: spontaneous breathing, nonlabored ventilation, respiratory function stable and patient connected to nasal cannula oxygen Cardiovascular status: stable and blood pressure returned to baseline Postop Assessment: no apparent nausea or vomiting Anesthetic complications: no   No notable events documented.   Last Vitals:  Vitals:   08/01/24 1022 08/01/24 1028  BP: (!) 107/54   Pulse: 71 73  Resp: (!) 24 17  Temp:  37.1 C  SpO2: 97% 97%    Last Pain:  Vitals:   08/01/24 1015  TempSrc:   PainSc: 0-No pain                 Andrea Limes

## 2024-08-02 ENCOUNTER — Telehealth: Payer: Self-pay | Admitting: Gastroenterology

## 2024-08-02 ENCOUNTER — Encounter (HOSPITAL_COMMUNITY): Payer: Self-pay | Admitting: Gastroenterology

## 2024-08-02 DIAGNOSIS — K31819 Angiodysplasia of stomach and duodenum without bleeding: Secondary | ICD-10-CM

## 2024-08-02 DIAGNOSIS — K253 Acute gastric ulcer without hemorrhage or perforation: Secondary | ICD-10-CM

## 2024-08-02 DIAGNOSIS — K317 Polyp of stomach and duodenum: Secondary | ICD-10-CM

## 2024-08-02 DIAGNOSIS — I5032 Chronic diastolic (congestive) heart failure: Secondary | ICD-10-CM | POA: Diagnosis not present

## 2024-08-02 DIAGNOSIS — K5730 Diverticulosis of large intestine without perforation or abscess without bleeding: Secondary | ICD-10-CM

## 2024-08-02 LAB — CBC
HCT: 27.3 % — ABNORMAL LOW (ref 36.0–46.0)
Hemoglobin: 8.6 g/dL — ABNORMAL LOW (ref 12.0–15.0)
MCH: 29.8 pg (ref 26.0–34.0)
MCHC: 31.5 g/dL (ref 30.0–36.0)
MCV: 94.5 fL (ref 80.0–100.0)
Platelets: 286 K/uL (ref 150–400)
RBC: 2.89 MIL/uL — ABNORMAL LOW (ref 3.87–5.11)
RDW: 15.8 % — ABNORMAL HIGH (ref 11.5–15.5)
WBC: 6.5 K/uL (ref 4.0–10.5)
nRBC: 0 % (ref 0.0–0.2)

## 2024-08-02 LAB — BASIC METABOLIC PANEL WITH GFR
Anion gap: 7 (ref 5–15)
BUN: 6 mg/dL — ABNORMAL LOW (ref 8–23)
CO2: 26 mmol/L (ref 22–32)
Calcium: 9 mg/dL (ref 8.9–10.3)
Chloride: 108 mmol/L (ref 98–111)
Creatinine, Ser: 0.77 mg/dL (ref 0.44–1.00)
GFR, Estimated: >60 mL/min (ref >60)
Glucose, Bld: 112 mg/dL — ABNORMAL HIGH (ref 70–99)
Potassium: 4 mmol/L (ref 3.5–5.1)
Sodium: 141 mmol/L (ref 135–145)

## 2024-08-02 LAB — GLUCOSE, CAPILLARY: Glucose-Capillary: 117 mg/dL — ABNORMAL HIGH (ref 70–99)

## 2024-08-02 NOTE — Plan of Care (Signed)
 Problem: Education: Goal: Knowledge of General Education information will improve Description: Including pain rating scale, medication(s)/side effects and non-pharmacologic comfort measures 08/02/2024 1006 by Mathews Norleen POUR, RN Outcome: Adequate for Discharge 08/02/2024 [phone] by Mathews Norleen POUR, RN Outcome: Progressing   Problem: Health Behavior/Discharge Planning: Goal: Ability to manage health-related needs will improve 08/02/2024 1006 by Mathews Norleen POUR, RN Outcome: Adequate for Discharge 08/02/2024 [phone] by Mathews Norleen POUR, RN Outcome: Progressing   Problem: Clinical Measurements: Goal: Ability to maintain clinical measurements within normal limits will improve 08/02/2024 1006 by Mathews Norleen POUR, RN Outcome: Adequate for Discharge 08/02/2024 [phone] by Mathews Norleen POUR, RN Outcome: Progressing Goal: Will remain free from infection 08/02/2024 1006 by Mathews Norleen POUR, RN Outcome: Adequate for Discharge 08/02/2024 [phone] by Mathews Norleen POUR, RN Outcome: Progressing Goal: Diagnostic test results will improve 08/02/2024 1006 by Mathews Norleen POUR, RN Outcome: Adequate for Discharge 08/02/2024 [phone] by Mathews Norleen POUR, RN Outcome: Progressing Goal: Respiratory complications will improve 08/02/2024 1006 by Mathews Norleen POUR, RN Outcome: Adequate for Discharge 08/02/2024 [phone] by Mathews Norleen POUR, RN Outcome: Progressing Goal: Cardiovascular complication will be avoided 08/02/2024 1006 by Mathews Norleen POUR, RN Outcome: Adequate for Discharge 08/02/2024 [phone] by Mathews Norleen POUR, RN Outcome: Progressing   Problem: Activity: Goal: Risk for activity intolerance will decrease 08/02/2024 1006 by Mathews Norleen POUR, RN Outcome: Adequate for Discharge 08/02/2024 [phone] by Mathews Norleen POUR, RN Outcome: Progressing   Problem: Nutrition: Goal: Adequate nutrition will be maintained 08/02/2024 1006 by Mathews Norleen POUR, RN Outcome: Adequate for Discharge 08/02/2024 [phone] by Mathews Norleen POUR, RN Outcome: Progressing   Problem: Coping: Goal: Level of anxiety will  decrease 08/02/2024 1006 by Mathews Norleen POUR, RN Outcome: Adequate for Discharge 08/02/2024 [phone] by Mathews Norleen POUR, RN Outcome: Progressing   Problem: Elimination: Goal: Will not experience complications related to bowel motility 08/02/2024 1006 by Mathews Norleen POUR, RN Outcome: Adequate for Discharge 08/02/2024 [phone] by Mathews Norleen POUR, RN Outcome: Progressing Goal: Will not experience complications related to urinary retention 08/02/2024 1006 by Mathews Norleen POUR, RN Outcome: Adequate for Discharge 08/02/2024 [phone] by Mathews Norleen POUR, RN Outcome: Progressing   Problem: Pain Managment: Goal: General experience of comfort will improve and/or be controlled 08/02/2024 1006 by Mathews Norleen POUR, RN Outcome: Adequate for Discharge 08/02/2024 [phone] by Mathews Norleen POUR, RN Outcome: Progressing   Problem: Safety: Goal: Ability to remain free from injury will improve 08/02/2024 1006 by Mathews Norleen POUR, RN Outcome: Adequate for Discharge 08/02/2024 [phone] by Mathews Norleen POUR, RN Outcome: Progressing   Problem: Skin Integrity: Goal: Risk for impaired skin integrity will decrease 08/02/2024 1006 by Mathews Norleen POUR, RN Outcome: Adequate for Discharge 08/02/2024 [phone] by Mathews Norleen POUR, RN Outcome: Progressing   Problem: Education: Goal: Ability to describe self-care measures that may prevent or decrease complications (Diabetes Survival Skills Education) will improve 08/02/2024 1006 by Mathews Norleen POUR, RN Outcome: Adequate for Discharge 08/02/2024 [phone] by Mathews Norleen POUR, RN Outcome: Progressing Goal: Individualized Educational Video(s) 08/02/2024 1006 by Mathews Norleen POUR, RN Outcome: Adequate for Discharge 08/02/2024 [phone] by Mathews Norleen POUR, RN Outcome: Progressing   Problem: Coping: Goal: Ability to adjust to condition or change in health will improve 08/02/2024 1006 by Mathews Norleen POUR, RN Outcome: Adequate for Discharge 08/02/2024 [phone] by Mathews Norleen POUR, RN Outcome: Progressing   Problem: Fluid Volume: Goal: Ability to maintain a balanced intake and  output will improve 08/02/2024 1006 by Mathews Norleen POUR, RN Outcome: Adequate for Discharge 08/02/2024  [phone] by Mathews Norleen POUR, RN Outcome: Progressing   Problem: Health Behavior/Discharge Planning: Goal: Ability to identify and utilize available resources and services will improve 08/02/2024 1006 by Mathews Norleen POUR, RN Outcome: Adequate for Discharge 08/02/2024 [phone] by Mathews Norleen POUR, RN Outcome: Progressing Goal: Ability to manage health-related needs will improve 08/02/2024 1006 by Naisha Wisdom, Norleen POUR, RN Outcome: Adequate for Discharge 08/02/2024 [phone] by Mathews Norleen POUR, RN Outcome: Progressing   Problem: Metabolic: Goal: Ability to maintain appropriate glucose levels will improve 08/02/2024 1006 by Mathews Norleen POUR, RN Outcome: Adequate for Discharge 08/02/2024 [phone] by Mathews Norleen POUR, RN Outcome: Progressing   Problem: Nutritional: Goal: Maintenance of adequate nutrition will improve 08/02/2024 1006 by Mathews Norleen POUR, RN Outcome: Adequate for Discharge 08/02/2024 [phone] by Mathews Norleen POUR, RN Outcome: Progressing Goal: Progress toward achieving an optimal weight will improve 08/02/2024 1006 by Mathews Norleen POUR, RN Outcome: Adequate for Discharge 08/02/2024 [phone] by Mathews Norleen POUR, RN Outcome: Progressing   Problem: Skin Integrity: Goal: Risk for impaired skin integrity will decrease 08/02/2024 1006 by Mathews Norleen POUR, RN Outcome: Adequate for Discharge 08/02/2024 [phone] by Mathews Norleen POUR, RN Outcome: Progressing   Problem: Tissue Perfusion: Goal: Adequacy of tissue perfusion will improve 08/02/2024 1006 by Mathews Norleen POUR, RN Outcome: Adequate for Discharge 08/02/2024 [phone] by Mathews Norleen POUR, RN Outcome: Progressing

## 2024-08-02 NOTE — Care Management Important Message (Signed)
 Important Message  Patient Details  Name: [name] MRN: [id number] Date of Birth: 1946-11-12   Important Message Given:  Yes - Medicare IM     Norval Slaven L Daquarius Dubeau 08/02/2024, 10:17 AM

## 2024-08-02 NOTE — Plan of Care (Signed)

## 2024-08-02 NOTE — Discharge Summary (Signed)
 Physician Discharge Summary   Patient: [name] MRN: [id number] DOB: Mar 04, 1946  Admit date:     07/30/2024  Discharge date: 08/02/24  Discharge Physician: Concepcion Riser   PCP: Marvine Rush, MD   Recommendations at discharge:   PCP follow up in 1 week.  Monitor H&H in 1 week. Advised cardiology follow-up, GI follow-up.  Discharge Diagnoses: Principal Problem:   Acute GI bleeding Active Problems:   Essential hypertension   Asthma   Controlled type 2 diabetes mellitus (HCC)   Chronic diastolic heart failure (HCC)   PAF (paroxysmal atrial fibrillation) (HCC)   Rectal bleeding   Melena   Gastritis and gastroduodenitis   Gastric polyp   Angiodysplasia of stomach   Cameron lesion, acute   Diverticulosis of colon without hemorrhage  Resolved Problems:   * No resolved hospital problems. *  Hospital Course: 78 y o woman with PMH of atrial fibrillation on anticoagulation, hypertension, diabetes mellitus, asthma, CHF, PUD who presented to the ED with BRBPR. Gastroenterology was consulted. CTA showed no acute bleed. GI evaluated her and performed EGD which showed Cameron ulcers, hiatal hernia, multiple gastric polyps, nonbleeding angiectasia in duodenum treated with APC. Colonoscopy showed diverticulosis, nonbleeding internal hemorrhoids.  Patient's hemoglobin remained stable, no active bleeding, restarted Xarelto  and she is hemodynamically stable to be discharged home.  I advised her to stop taking aspirin , follow-up with PCP for repeat hemoglobin check and also GI for further management.  Patient and husband understand and agree with the discharge plan.   Assessment and Plan: Acute GI bleeding Bloody stools of 3 days duration, likely lower GI bleed.  No vomiting, abdominal pain. On Xarelto  and aspirin  at home.  Denies NSAID use.  Last colonoscopy 2023-sigmoid diverticulosis, external hemorrhoids. CTA done no acute bleeding. S/p EGD - Cameron ulcer, hiatal hernia, multiple  gastric polyps.non bleeding angio ectasia in duodenum treated with APC. S/p colonoscopy - diverticulosis, non bleeding internal hemorrhoids. GI advised to advance diet, xarelto  restarted.   I stopped aspirin , advised to talk to cardiology.  Advised PCP follow-up and GI follow-up upon discharge.  Patient and her husband understand agree with the discharge plan.   Acute blood loss anemia.  Presented with Hgb of 6.1. Baseline Hb around 12. Received 2 units PRBCs. Repeat Hb stable above 8. Continue to monitor H/H as outpatient with PCP.   PAF (paroxysmal atrial fibrillation) (HCC) Controlled. Xarelto  resumed.  Advised to stop taking aspirin , discussed with cardiology.   Chronic diastolic heart failure (HCC) Stable and Compensated.  Recent echo 05/2024 EF 60 to 65%, Resume Lasix  home dose.   Controlled type 2 diabetes mellitus (HCC) Controlled, A1c 6.3- 12/2023. Resume metformin therapy. Patient is advised to follow-up with PCP.   Asthma Stable.  Continue bronchodilators, home inhalers   Essential hypertension BP lower side.  Patient is not on any antihypertensive medications.         Consultants: GI Procedures performed: EGD, colonoscopy Disposition: Home Diet recommendation:  Discharge Diet Orders (From admission, onward)     Start     Ordered   08/02/24 0000  Diet - low sodium heart healthy        08/02/24 0952   08/02/24 0000  Diet Carb Modified        08/02/24 0952           Cardiac and Carb modified diet DISCHARGE MEDICATION: Allergies as of 08/02/2024       Reactions   Cephalexin Other (See Comments)   Unknown  Nsaids Other (See Comments)   Caused Ulcers.   Darvocet [propoxyphene N-acetaminophen ] Nausea Only   Penicillins Rash   Immediate rash, facial/tongue/throat swelling, SOB or lightheadedness with hypotension   Percocet [oxycodone -acetaminophen ] Nausea Only        Medication List     STOP taking these medications    aspirin  EC 81 MG  tablet   metoprolol  tartrate 25 MG tablet Commonly known as: LOPRESSOR        TAKE these medications    acetaminophen  500 MG tablet Commonly known as: TYLENOL  Take 1,000 mg by mouth every 6 (six) hours as needed for mild pain (pain score 1-3) or headache.   AZO URINARY TRACT SUPPORT PO Take 2 tablets by mouth daily.   cholecalciferol  25 MCG (1000 UNIT) tablet Commonly known as: VITAMIN D3 Take 1,000 Units by mouth daily.   cyanocobalamin 1000 MCG tablet Commonly known as: VITAMIN B12 Take 1,000 mcg by mouth daily.   dimenhyDRINATE 50 MG tablet Commonly known as: DRAMAMINE Take 50 mg by mouth every 8 (eight) hours as needed for dizziness.   escitalopram  20 MG tablet Commonly known as: LEXAPRO  Take 20 mg by mouth daily.   ferrous sulfate  325 (65 FE) MG tablet Take 1 tablet (325 mg total) by mouth daily with breakfast.   furosemide  20 MG tablet Commonly known as: LASIX  TAKE 1 TABLET BY MOUTH TWICE DAILY.TAKE AN ADDITIONAL TABLET DAILY AS NEEDED FOR SHORTNESS OF BREATH   gabapentin  300 MG capsule Commonly known as: NEURONTIN  Take 300 mg by mouth at bedtime.   INTESTINEX PO Take 1 capsule by mouth daily. Caplet   Klor-Con  M20 20 MEQ tablet Generic drug: potassium chloride  SA TAKE 1 TABLET BY MOUTH EVERY DAY   metFORMIN 500 MG tablet Commonly known as: GLUCOPHAGE Take 500 mg by mouth 2 (two) times daily with a meal.   montelukast  10 MG tablet Commonly known as: SINGULAIR  Take 10 mg by mouth every morning.   omeprazole  20 MG capsule Commonly known as: PRILOSEC Take 20 mg by mouth 2 (two) times daily before a meal.   PROBIOTIC DAILY PO Take 1 capsule by mouth daily.   rivaroxaban  20 MG Tabs tablet Commonly known as: XARELTO  Take 20 mg by mouth daily with supper.   Turmeric 450 MG Caps Take 450 mg by mouth daily.   zolpidem  10 MG tablet Commonly known as: AMBIEN  Take 10 mg by mouth at bedtime as needed for sleep.        Discharge Exam: Filed  Weights   07/30/24 1538 07/30/24 1910  Weight: 80 kg 79.7 kg      08/02/2024    3:49 AM 08/01/2024    7:56 PM 08/01/2024   12:35 PM  Vitals with BMI  Systolic 115 137 876  Diastolic 50 58 51  Pulse 82 80 70   General - Elderly Caucasian female, no apparent distress HEENT - PERRLA, EOMI, atraumatic head, non tender sinuses. Lung - Clear, diffuse rales, rhonchi, wheezes. Heart - S1, S2 heard, no murmurs, rubs, trace pedal edema. Abdomen - Soft, non tender, bowel sounds good Neuro - Alert, awake and oriented x 3, non focal exam. Skin - Warm and dry.    Condition at discharge: stable  The results of significant diagnostics from this hospitalization (including imaging, microbiology, ancillary and laboratory) are listed below for reference.   Imaging Studies: CT ANGIO GI BLEED Result Date: 07/31/2024 EXAM: CTA ABDOMEN AND PELVIS WITH IV CONTRAST 07/31/2024 02:22:18 AM TECHNIQUE: CTA images of the abdomen and  pelvis with intravenous contrast. Three-dimensional MIP/volume rendered formations were performed. Automated exposure control, iterative reconstruction, and/or weight based adjustment of the mA/kV was utilized to reduce the radiation dose to as low as reasonably achievable. of iohexol  (OMNIPAQUE ) 350 MG/ML injection was used. COMPARISON: None available. CLINICAL HISTORY: Lower GI bleed, per GI recommendation. Patient complains of rectal bleeding x 3 days. Patient is on aspirin  and Xarelto . When EMS arrived, patient's blood pressure was 93/47 and they gave her 500cc of NS. FINDINGS: VASCULATURE: The aorta and its mesenteric, renal, and iliac artery branches are widely patent without hemodynamically significant stenosis, aneurysm, or dissection. GI BLEED: No evidence of active GI bleeding. AORTA: No acute finding. No abdominal aortic aneurysm. No dissection. CELIAC TRUNK: No acute finding. No occlusion or significant stenosis. SUPERIOR MESEMTRIC ARTERY: No acute finding. No occlusion or  significant stenosis. INFERIOR MESENTERIC ARTERY: No acute finding. No occlusion or significant stenosis. RENAL ARTERIES: No acute finding. No occlusion or significant stenosis. ILIAC ARTERIES: No acute finding. No occlusion or significant stenosis. ABDOMEN/PELVIS: Hepatic steatosis. Cholecystectomy. Large hiatal hernia containing the entire stomach and a portion of the nonobstructed transverse colon. LOWER CHEST: Left basilar atelectasis secondary to a large hiatal hernia. LIVER: Hepatic steatosis. GALLBLADDER AND BILE DUCTS: Cholecystectomy. No biliary ductal dilatation. SPLEEN: The spleen is unremarkable. PANCREAS: The pancreas is unremarkable. ADRENAL GLANDS: Bilateral adrenal glands demonstrate no acute abnormality. KIDNEYS, URETERS AND BLADDER: Nonobstructing nephrolithiasis. No hydronephrosis. No perinephric or periureteral stranding. Urinary bladder is unremarkable. GI AND BOWEL: Stomach and duodenal sweep demonstrate no acute abnormality. There is no bowel obstruction. No abnormal bowel wall thickening. A few sigmoid diverticula without diverticulitis. Large hiatal hernia containing the entire stomach and a portion of the nonobstructed transverse colon. REPRODUCTIVE: Reproductive organs are unremarkable. PERITONEUM AND RETROPERITONEUM: No ascites or free air. LYMPH NODES: No lymphadenopathy. BONES AND SOFT TISSUES: No acute abnormality of the bones. No acute soft tissue abnormality. IMPRESSION: 1. No evidence of active GI bleeding. 2. Large hiatal hernia containing the entire stomach and a portion of the nonobstructed transverse colon. 3. A few sigmoid diverticula without diverticulitis. Electronically signed by: Norman Gatlin MD 07/31/2024 02:36 AM EDT RP Workstation: [id number]    Microbiology: Results for orders placed or performed in visit on 05/13/24  Microscopic Examination     Status: None   Collection Time: 05/13/24 11:31 AM   Urine  Result Value Ref Range Status   WBC, UA 0-5 0 - 5 /hpf  Final   RBC, Urine None seen 0 - 2 /hpf Final   Epithelial Cells (non renal) 0-10 0 - 10 /hpf Final   Bacteria, UA None seen None seen/Few Final    Labs: CBC: Recent Labs  Lab 07/30/24 1540 07/30/24 1630 07/31/24 0208 07/31/24 0534 07/31/24 1731 08/01/24 0451 08/01/24 1749 08/02/24 0511  WBC 7.9  --  7.6  --  8.9 6.3 7.3 6.5  NEUTROABS 4.9  --   --   --   --   --   --   --   HGB 6.1*   < > 8.4* 8.6* 9.8* 8.3* 8.9* 8.6*  HCT 19.0*   < > 25.4* 26.1* 31.3* 25.4* 27.8* 27.3*  MCV 96.4  --  92.0  --  94.3 93.4 95.2 94.5  PLT 322  --  261  --  307 270 303 286   < > = values in this interval not displayed.   Basic Metabolic Panel: Recent Labs  Lab 07/30/24 1540 07/30/24 1630 07/31/24 0208 08/01/24 0451  08/02/24 0511  NA 140 140 141 138 141  K 3.6 3.8 3.9 4.0 4.0  CL 108 105 108 110 108  CO2 22  --  25 24 26   GLUCOSE 138* 133* 103* 109* 112*  BUN 19 16 13  6* 6*  CREATININE 0.87 0.90 0.81 0.78 0.77  CALCIUM 8.5*  --  8.5* 9.0 9.0   Liver Function Tests: Recent Labs  Lab 07/30/24 1540  AST 23  ALT 21  ALKPHOS 53  BILITOT 0.5  PROT 5.3*  ALBUMIN 3.1*   CBG: Recent Labs  Lab 08/01/24 0819 08/01/24 1112 08/01/24 1638 08/01/24 1958 08/02/24 0719  GLUCAP 108* 119* 128* 98 117*    Discharge time spent: 34 minutes.  Signed: Concepcion Riser, MD [facility] 08/02/2024

## 2024-08-02 NOTE — Telephone Encounter (Signed)
 Need CBC in 1 week - anemia.  Hospital follow up with chelsea or Dr. Eartha in 2-3 weeks. I did not see patient in the hospital.

## 2024-08-02 NOTE — Telephone Encounter (Signed)
 SABRA

## 2024-08-05 ENCOUNTER — Other Ambulatory Visit (INDEPENDENT_AMBULATORY_CARE_PROVIDER_SITE_OTHER): Payer: Self-pay

## 2024-08-05 ENCOUNTER — Telehealth: Payer: Self-pay

## 2024-08-05 DIAGNOSIS — E1159 Type 2 diabetes mellitus with other circulatory complications: Secondary | ICD-10-CM | POA: Diagnosis not present

## 2024-08-05 DIAGNOSIS — I480 Paroxysmal atrial fibrillation: Secondary | ICD-10-CM | POA: Diagnosis not present

## 2024-08-05 DIAGNOSIS — E6609 Other obesity due to excess calories: Secondary | ICD-10-CM | POA: Diagnosis not present

## 2024-08-05 DIAGNOSIS — E782 Mixed hyperlipidemia: Secondary | ICD-10-CM | POA: Diagnosis not present

## 2024-08-05 DIAGNOSIS — I10 Essential (primary) hypertension: Secondary | ICD-10-CM | POA: Diagnosis not present

## 2024-08-05 DIAGNOSIS — K922 Gastrointestinal hemorrhage, unspecified: Secondary | ICD-10-CM | POA: Diagnosis not present

## 2024-08-05 DIAGNOSIS — Z6830 Body mass index (BMI) 30.0-30.9, adult: Secondary | ICD-10-CM | POA: Diagnosis not present

## 2024-08-05 DIAGNOSIS — D649 Anemia, unspecified: Secondary | ICD-10-CM

## 2024-08-05 LAB — SURGICAL PATHOLOGY

## 2024-08-05 NOTE — Transitions of Care (Post Inpatient/ED Visit) (Signed)
   08/05/2024  Name: NIAH HEINLE MRN: [id number] DOB: [birth year]  Today's TOC FU Call Status: Today's TOC FU Call Status:: Unsuccessful Call (1st Attempt) Unsuccessful Call (1st Attempt) Date: 08/05/24  Attempted to reach the patient regarding the most recent Inpatient/ED visit.  Follow Up Plan: Additional outreach attempts will be made to reach the patient to complete the Transitions of Care (Post Inpatient/ED visit) call.   Medford Balboa, BSN, RN [place]  VBCI - [facility] RN Care Manager [phone]

## 2024-08-06 ENCOUNTER — Telehealth: Payer: Self-pay

## 2024-08-06 ENCOUNTER — Ambulatory Visit (INDEPENDENT_AMBULATORY_CARE_PROVIDER_SITE_OTHER): Payer: Self-pay | Admitting: Gastroenterology

## 2024-08-06 NOTE — Transitions of Care (Post Inpatient/ED Visit) (Signed)
 08/06/2024  Name: [name] MRN: [id number] DOB: 1946-09-11  Today's TOC FU Call Status: Today's TOC FU Call Status:: Successful TOC FU Call Completed TOC FU Call Complete Date: 08/06/24 Patient's Name and Date of Birth confirmed.  Transition Care Management Follow-up Telephone Call Date of Discharge: 08/02/24 [facility] Type of Discharge: Inpatient Admission Primary Inpatient Discharge Diagnosis:: Acute GI Bleeding How have you been since you were released from the hospital?: Better Any questions or concerns?: No  Items Reviewed: Did you receive and understand the discharge instructions provided?: Yes Medications obtained,verified, and reconciled?: Yes (Medications Reviewed) Any new allergies since your discharge?: No Dietary orders reviewed?: Yes Type of Diet Ordered:: Soft Diet Do you have support at home?: Yes People in Home [RPT]: spouse Name of Support/Comfort Primary Source: Lynwood Sor  Medications Reviewed Today: Medications Reviewed Today     Reviewed by Moises Reusing, RN (Case Manager) on 08/06/24 at 1429  Med List Status: <None>   Medication Order Taking? Sig Documenting Provider Last Dose Status Informant  acetaminophen  (TYLENOL ) 500 MG tablet [id number]  Take 1,000 mg by mouth every 6 (six) hours as needed for mild pain (pain score 1-3) or headache. [provider]  Active Self, Pharmacy Records  cholecalciferol  (VITAMIN D3) 25 MCG (1000 UNIT) tablet [id number]  Take 1,000 Units by mouth daily. [provider]  Active Self, Pharmacy Records  cyanocobalamin (VITAMIN B12) 1000 MCG tablet [id number]  Take 1,000 mcg by mouth daily. [provider]  Active Self, Pharmacy Records  dimenhyDRINATE (DRAMAMINE) 50 MG tablet [id number]  Take 50 mg by mouth every 8 (eight) hours as needed for dizziness. [provider]  Active Self, Pharmacy Records  escitalopram  (LEXAPRO ) 20 MG tablet [id number]  Take 20 mg by  mouth daily. [provider]  Active Self, Pharmacy Records  ferrous sulfate  325 (65 FE) MG tablet [id number]  Take 1 tablet (325 mg total) by mouth daily with breakfast. Golda Claudis PENNER, MD  Active Self, Pharmacy Records  furosemide  (LASIX ) 20 MG tablet [id number]  TAKE 1 TABLET BY MOUTH TWICE DAILY.TAKE AN ADDITIONAL TABLET DAILY AS NEEDED FOR SHORTNESS OF BREATH Mallipeddi, Vishnu P, MD  Active Self, Pharmacy Records  gabapentin  (NEURONTIN ) 300 MG capsule [id number]  Take 300 mg by mouth at bedtime. [provider]  Active Self, Pharmacy Records  KLOR-CON  M20 20 MEQ tablet [id number]  TAKE 1 TABLET BY MOUTH EVERY DAY Mallipeddi, Vishnu P, MD  Active Self, Pharmacy Records  Lactobacillus (INTESTINEX PO) [id number]  Take 1 capsule by mouth daily. Caplet [provider]  Active Self, Pharmacy Records  metFORMIN (GLUCOPHAGE) 500 MG tablet [id number]  Take 500 mg by mouth 2 (two) times daily with a meal.  [provider]  Active Self, Pharmacy Records  montelukast  (SINGULAIR ) 10 MG tablet [id number]  Take 10 mg by mouth every morning. [provider]  Active Self, Pharmacy Records  omeprazole  (PRILOSEC) 20 MG capsule [id number]  Take 20 mg by mouth 2 (two) times daily before a meal. [provider]  Active Self, Pharmacy Records  Phenazopyrid-Cranbry-C-Probiot (AZO URINARY TRACT SUPPORT PO) [id number]  Take 2 tablets by mouth daily. [provider]  Active Self, Pharmacy Records  Probiotic Product (PROBIOTIC DAILY PO) [id number]  Take 1 capsule by mouth daily. [provider]  Active Self, Pharmacy Records  rivaroxaban  (XARELTO ) 20 MG TABS tablet [id number]  Take 20 mg by mouth daily with supper. [provider]  Active Self, Pharmacy Records  Med Note (WARD, ANGELICA G   Tue Jul 30, 2024  3:58 PM)    Turmeric 450 MG CAPS [id number]  Take 450 mg by mouth daily. [provider]  Active Self, Pharmacy Records  zolpidem   (AMBIEN ) 10 MG tablet [id number]  Take 10 mg by mouth at bedtime as needed for sleep. [provider]  Active Self, Pharmacy Records  Med List Note (Ward, Angelica, CPhT 07/30/24 1620): Medications were brought in for verification             Home Care and Equipment/Supplies: Were Home Health Services Ordered?: NA Any new equipment or medical supplies ordered?: NA  Functional Questionnaire: Do you need assistance with bathing/showering or dressing?: No Do you need assistance with meal preparation?: No Do you need assistance with eating?: No Do you have difficulty maintaining continence: No Do you need assistance with getting out of bed/getting out of a chair/moving?: No Do you have difficulty managing or taking your medications?: No  Follow up appointments reviewed: PCP Follow-up appointment confirmed?: Yes Date of PCP follow-up appointment?: 08/05/24 Follow-up Provider: Norleen General Specialist [facility] Follow-up appointment confirmed?: No Reason Specialist Follow-Up Not Confirmed: Patient has Specialist Provider Number and will Call for Appointment Do you need transportation to your follow-up appointment?: No Do you understand care options if your condition(s) worsen?: Yes-patient verbalized understanding  SDOH Interventions Today    Flowsheet Row Most Recent Value  SDOH Interventions   Food Insecurity Interventions Intervention Not Indicated  Housing Interventions Intervention Not Indicated  Transportation Interventions Intervention Not Indicated  Utilities Interventions Intervention Not Indicated    Medford Balboa, BSN, RN [place]  VBCI - [facility] RN Care Manager [phone]

## 2024-08-08 NOTE — Progress Notes (Signed)
 procedure note and pathology result faxed to PCP

## 2024-08-09 DIAGNOSIS — D649 Anemia, unspecified: Secondary | ICD-10-CM | POA: Diagnosis not present

## 2024-08-10 LAB — CBC WITH DIFFERENTIAL/PLATELET
Basophils Absolute: 0.1 x10E3/uL (ref 0.0–0.2)
Basos: 1 %
EOS (ABSOLUTE): 0.4 x10E3/uL (ref 0.0–0.4)
Eos: 6 %
Hematocrit: 36.5 % (ref 34.0–46.6)
Hemoglobin: 11.2 g/dL (ref 11.1–15.9)
Immature Grans (Abs): 0 x10E3/uL (ref 0.0–0.1)
Immature Granulocytes: 0 %
Lymphocytes Absolute: 1.4 x10E3/uL (ref 0.7–3.1)
Lymphs: 19 %
MCH: 29.1 pg (ref 26.6–33.0)
MCHC: 30.7 g/dL — ABNORMAL LOW (ref 31.5–35.7)
MCV: 95 fL (ref 79–97)
Monocytes Absolute: 0.7 x10E3/uL (ref 0.1–0.9)
Monocytes: 10 %
Neutrophils Absolute: 4.6 x10E3/uL (ref 1.4–7.0)
Neutrophils: 64 %
Platelets: 396 x10E3/uL (ref 150–450)
RBC: 3.85 x10E6/uL (ref 3.77–5.28)
RDW: 13.6 % (ref 11.7–15.4)
WBC: 7.1 x10E3/uL (ref 3.4–10.8)

## 2024-08-11 ENCOUNTER — Ambulatory Visit: Payer: Self-pay | Admitting: Gastroenterology

## 2024-09-05 ENCOUNTER — Ambulatory Visit (INDEPENDENT_AMBULATORY_CARE_PROVIDER_SITE_OTHER): Admitting: Gastroenterology

## 2024-09-05 ENCOUNTER — Encounter (INDEPENDENT_AMBULATORY_CARE_PROVIDER_SITE_OTHER): Payer: Self-pay | Admitting: Gastroenterology

## 2024-09-05 VITALS — BP 119/68 | HR 86 | Temp 97.4°F | Ht 61.0 in | Wt 169.3 lb

## 2024-09-05 DIAGNOSIS — K254 Chronic or unspecified gastric ulcer with hemorrhage: Secondary | ICD-10-CM

## 2024-09-05 DIAGNOSIS — K625 Hemorrhage of anus and rectum: Secondary | ICD-10-CM

## 2024-09-05 DIAGNOSIS — D500 Iron deficiency anemia secondary to blood loss (chronic): Secondary | ICD-10-CM | POA: Diagnosis not present

## 2024-09-05 DIAGNOSIS — K449 Diaphragmatic hernia without obstruction or gangrene: Secondary | ICD-10-CM | POA: Diagnosis not present

## 2024-09-05 DIAGNOSIS — D649 Anemia, unspecified: Secondary | ICD-10-CM

## 2024-09-05 NOTE — Patient Instructions (Signed)
 Please let me know once you determine the name of the surgeon you were wanting to be referred to for hiatal hernia repair Continue iron pill for now, we will check iron levels and I will be in touch once I have these results Continue omeprazole  twice daily Make sure to do more frequent, smaller meals as opposed to 3 larger meals per day  Follow up 4 months  It was a pleasure to see you today. I want to create trusting relationships with patients and provide genuine, compassionate, and quality care. I truly value your feedback! please be on the lookout for a survey regarding your visit with me today. I appreciate your input about our visit and your time in completing this!    Corday Wyka L. Mirely Pangle, MSN, APRN, AGNP-C Adult-Gerontology Nurse Practitioner [facility] at [facility]

## 2024-09-05 NOTE — Progress Notes (Addendum)
 Referring Provider: Marvine Rush, MD Primary Care Physician:  Marvine Rush, MD Primary GI Physician: Dr. Eartha   Chief Complaint  Patient presents with   Hospitalization Follow-up    Pt arrives to follow up from recent hospital stay in early August. Having some pain in upper abdominal area. Pt did fall before going to hospital and hit ribs of bathroom faucet but no xray done on ribs. Pt has IBS-D. No issues with bowel movements. Pt is wanting to know if she is to stay on the soft diet that was recommended at hospital.    HPI:   [name] is a 78 y.o. female with past medical history of anxiety,arthritis, asthma, depression, gastric ulcer, GERD, HTN, IDA, neuropathy, Type 2 DM   Patient presenting today for:  Hospital follow up of rectal bleeding and anemia, secondary to large hiatal hernia and cameron ulcers  Admission on 8/6 for rectal bleeding, black stools, hgb 6.1 on arrival  -CT angio gi bleed with no evidence of active bleeding, large hh containing entire stomach, portion of non obstructed transverse colon, diverticula without diverticulitis  -Improvement in hgb s/p 2 units PRBCs -She underwent EGD/Colonoscopy on 8/7  Given findings of large hh and cameron ulcers, recommend PPI, iron PO and referral to surgery for possible hiatal hernia repair  Last hgb was 11.2 on 08/09/24  Present:  States she is having some issues with dysphagia, feeling that food sits in mid chest. She denies any melena or rectal bleeding. No abdominal pain but has some tenderness across upper abdomen, she does note she fell prior to her admission when she had a syncopal episode and endorses pain feels similar to previous broken ribs she has had. No nausea, vomiting. She has had some weight loss since her admission. She notes she does not eat as much as she used to but also avoids overeating. She is taking omeprazole  20mg  BID, iron pill daily. Denies heartburn or acid reflux symptoms.   Colonoscopy:  Diverticulosis in the left colon.                           - Non-bleeding internal hemorrhoids.                           - No specimens collected. Endoscopy: Z-line regular.                           - 10 cm hiatal hernia with a single Cameron ulcer.                           - Multiple gastric polyps. Resected and retrieved.                            representative polyp removed only                           - Erythematous mucosa in the antrum. Biopsied.                           - A single non-bleeding angioectasia in the  duodenum. Treated with argon plasma coagulation                            (APC).                           -Bleeding cameron lesion s/p APC .                           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 Likely cause of anemia is cameron lesions in                            setting of anticoagulation and antiplatelet use   Filed Weights   09/05/24 1349  Weight: 169 lb 4.8 oz (76.8 kg)     Past Medical History:  Diagnosis Date   Anxiety 12/29/2022   Arthritis    Asthma    Carpal tunnel syndrome    Depression    Dyspnea    Dysrhythmia    Gastric ulcer    GERD (gastroesophageal reflux disease)    H/O hiatal hernia    Heart murmur    since birth   History of heart murmur in childhood    History of kidney stones    Hypertension    Iron deficiency anemia    Neuropathy    Following neck surgery.   Paralysis (HCC)    PONV (postoperative nausea and vomiting)    Primary localized osteoarthritis of right knee 02/06/2024   Pulmonary embolus [facility])    November 2020   Type 2 diabetes mellitus [facility])     Past Surgical History:  Procedure Laterality Date   APPENDECTOMY     ATRIAL FIBRILLATION ABLATION N/A 10/09/2023   Procedure: ATRIAL FIBRILLATION ABLATION;  Surgeon: Kennyth Chew, MD;  Location: [facility]  INVASIVE CV LAB;  Service: Cardiovascular;  Laterality: N/A;   BIOPSY  06/01/2022   Procedure: BIOPSY;  Surgeon: Golda Claudis PENNER, MD;  [facility];  Service: Endoscopy;;   CHOLECYSTECTOMY     COLONOSCOPY     COLONOSCOPY N/A 09/26/2013   Procedure: COLONOSCOPY;  Surgeon: Claudis PENNER Golda, MD;  [facility];  Service: Endoscopy;  Laterality: N/A;  1030-rescheduled to 12:00pm Ann notified pt   COLONOSCOPY N/A 03/27/2015   Procedure: COLONOSCOPY;  Surgeon: Claudis PENNER Golda, MD;  [facility];  Service: Endoscopy;  Laterality: N/A;  11:15 - moved to 8:25 - Ann to notify pt   COLONOSCOPY N/A 08/01/2024   Procedure: COLONOSCOPY;  Surgeon: Cinderella Deatrice FALCON, MD;  [facility];  Service: Endoscopy;  Laterality: N/A;   COLONOSCOPY WITH PROPOFOL  N/A 06/01/2022   Procedure: COLONOSCOPY WITH PROPOFOL ;  Surgeon: Golda Claudis PENNER, MD;  [facility];  Service: Endoscopy;  Laterality: N/A;  1005 ASA 2   CYSTOSCOPY/URETEROSCOPY/HOLMIUM LASER/STENT PLACEMENT Left 12/23/2022   Procedure: CYSTOSCOPY/ LEFT RETROGRADE/ URETEROSCOPY/HOLMIUM LASER/STENT PLACEMENT;  Surgeon: Nieves Cough, MD;  Location: [facility] ORS;  Service: Urology;  Laterality: Left;   ESOPHAGOGASTRODUODENOSCOPY  12/08/2011   Procedure: ESOPHAGOGASTRODUODENOSCOPY (EGD);  Surgeon: Claudis RAYMOND Rivet, MD;  [facility];  Service: Endoscopy;  Laterality: N/A;  3:00    ESOPHAGOGASTRODUODENOSCOPY N/A 03/27/2015   Procedure: ESOPHAGOGASTRODUODENOSCOPY (EGD);  Surgeon: Claudis RAYMOND Rivet, MD;  [facility];  Service: Endoscopy;  Laterality: N/A;   ESOPHAGOGASTRODUODENOSCOPY N/A 08/01/2024   Procedure: EGD (ESOPHAGOGASTRODUODENOSCOPY);  Surgeon: Cinderella Deatrice FALCON, MD;  [facility];  Service: Endoscopy;  Laterality: N/A;   EYE SURGERY Bilateral    cataract surgery   GIVENS CAPSULE STUDY N/A 11/04/2015   Procedure: GIVENS CAPSULE STUDY;  Surgeon: Claudis RAYMOND Rivet, MD;  [facility];  Service: Endoscopy;  Laterality: N/A;   Hysterscopy     KNEE ARTHROSCOPY     Left   LEFT HEART CATH AND CORONARY ANGIOGRAPHY N/A 05/30/2024   Procedure: LEFT HEART CATH AND CORONARY ANGIOGRAPHY;  Surgeon: Mady Bruckner, MD;  Location: [facility] INVASIVE CV LAB;  Service: Cardiovascular;  Laterality: N/A;   NECK SURGERY     x 2    TONSILLECTOMY     TOTAL KNEE ARTHROPLASTY  07/11/2012   Procedure: TOTAL KNEE ARTHROPLASTY;  Surgeon: Toribio FALCON Chancy, MD;  Location: [facility] OR;  Service: Orthopedics;  Laterality: Left;  left total knee arthroplasty   TOTAL KNEE ARTHROPLASTY Right 02/06/2024   Procedure: TOTAL KNEE ARTHROPLASTY;  Surgeon: Josefina Chew, MD;  Location: [facility];  Service: Orthopedics;  Laterality: Right;   UPPER GASTROINTESTINAL ENDOSCOPY     YAG LASER APPLICATION Right 02/21/2017   Procedure: YAG LASER APPLICATION;  Surgeon: Oneil Platts, MD;  Location: [facility];  Service: Ophthalmology;  Laterality: Right;    Current Outpatient Medications  Medication Sig Dispense Refill   acetaminophen  (TYLENOL ) 500 MG tablet Take 1,000 mg by mouth every 6 (six) hours as needed for mild pain (pain score 1-3) or headache.     cholecalciferol  (VITAMIN D3) 25 MCG (1000 UNIT) tablet Take 1,000 Units by mouth daily.     cyanocobalamin (VITAMIN B12) 1000 MCG tablet Take 1,000 mcg by mouth daily.     escitalopram  (LEXAPRO ) 20 MG tablet Take 20 mg by mouth daily.     ferrous sulfate  325 (65 FE) MG tablet Take 1 tablet (325 mg total) by mouth daily with breakfast.     furosemide  (LASIX ) 20 MG tablet TAKE 1 TABLET BY MOUTH TWICE DAILY.TAKE AN ADDITIONAL TABLET DAILY AS NEEDED FOR SHORTNESS OF BREATH 180 tablet 1   gabapentin  (NEURONTIN ) 300 MG capsule Take 300 mg by mouth at bedtime.     KLOR-CON  M20 20 MEQ tablet TAKE 1 TABLET BY MOUTH EVERY DAY 90 tablet 1   metFORMIN (GLUCOPHAGE) 500 MG tablet Take 500 mg by mouth 2 (two) times daily with a meal.      montelukast  (SINGULAIR ) 10 MG tablet Take 10 mg by mouth  every morning.     omeprazole  (PRILOSEC) 20 MG capsule Take 20 mg by mouth 2 (two) times daily before a meal.     Phenazopyrid-Cranbry-C-Probiot (AZO URINARY TRACT SUPPORT PO) Take 2 tablets by mouth daily.     Probiotic Product (PROBIOTIC DAILY PO) Take 1 capsule by mouth daily.     rivaroxaban  (XARELTO ) 20 MG TABS tablet Take 20 mg by mouth daily with supper.     Turmeric 450 MG CAPS Take 450 mg by mouth daily.     zolpidem  (AMBIEN ) 10 MG tablet Take 10 mg by mouth at bedtime as needed for  sleep.     No current facility-administered medications for this visit.    Allergies as of 09/05/2024 - Review Complete 09/05/2024  Allergen Reaction Noted   Cephalexin Other (See Comments) 12/08/2011   Nsaids Other (See Comments) 07/06/2012   Darvocet [propoxyphene n-acetaminophen ] Nausea Only 12/08/2011   Penicillins Rash 12/30/2009   Percocet [oxycodone -acetaminophen ] Nausea Only 10/27/2011    Social History   Socioeconomic History   Marital status: Married    Spouse name: Not on file   Number of children: Not on file   Years of education: Not on file   Highest education level: Not on file  Occupational History   Not on file  Tobacco Use   Smoking status: Never    Passive exposure: Never   Smokeless tobacco: Never  Vaping Use   Vaping status: Never Used  Substance and Sexual Activity   Alcohol use: No    Alcohol/week: 0.0 standard drinks of alcohol   Drug use: No   Sexual activity: Not Currently    Birth control/protection: Post-menopausal  Other Topics Concern   Not on file  Social History Narrative   Not on file   Social Drivers of Health   Financial Resource Strain: Low Risk  (06/25/2021)   Overall Financial Resource Strain (CARDIA)    Difficulty of Paying Living Expenses: Not hard at all  Food Insecurity: No Food Insecurity (08/06/2024)   Hunger Vital Sign    Worried About Running Out of Food in the Last Year: Never true    Ran Out of Food in the Last Year: Never true   Transportation Needs: No Transportation Needs (08/06/2024)   PRAPARE - Administrator, Civil Service (Medical): No    Lack of Transportation (Non-Medical): No  Physical Activity: Insufficiently Active (06/25/2021)   Exercise Vital Sign    Days of Exercise per Week: 2 days    Minutes of Exercise per Session: 20 min  Stress: No Stress Concern Present (06/25/2021)   [employer] of Occupational Health - Occupational Stress Questionnaire    Feeling of Stress : Not at all  Social Connections: Socially Integrated (07/30/2024)   Social Connection and Isolation Panel    Frequency of Communication with Friends and Family: More than three times a week    Frequency of Social Gatherings with Friends and Family: More than three times a week    Attends Religious Services: 1 to 4 times per year    Active Member of [employer] or Organizations: Yes    Attends Engineer, structural: More than 4 times per year    Marital Status: Married    Review of systems General: negative for malaise, night sweats, fever, chills, weight loss Neck: Negative for lumps, goiter, pain and significant neck swelling Resp: Negative for cough, wheezing, dyspnea at rest CV: Negative for chest pain, leg swelling, palpitations, orthopnea GI: denies melena, hematochezia, nausea, vomiting, diarrhea, constipation, dysphagia, odyonophagia, early satiety or unintentional weight loss.  MSK: Negative for joint pain or swelling, back pain, and muscle pain. Derm: Negative for itching or rash Psych: Denies depression, anxiety, memory loss, confusion. No homicidal or suicidal ideation.  Heme: Negative for prolonged bleeding, bruising easily, and swollen nodes. Endocrine: Negative for cold or heat intolerance, polyuria, polydipsia and goiter. Neuro: negative for tremor, gait imbalance, syncope and seizures. The remainder of the review of systems is noncontributory.  Physical Exam: BP 119/68   Pulse 86   Temp (!) 97.4 F  (36.3 C)   Ht 5' 1 (  1.549 m)   Wt 169 lb 4.8 oz (76.8 kg)   BMI 31.99 kg/m  General:   Alert and oriented. No distress noted. Pleasant and cooperative.  Head:  Normocephalic and atraumatic. Eyes:  Conjuctiva clear without scleral icterus. Mouth:  Oral mucosa pink and moist. Good dentition. No lesions. Heart: Normal rate and rhythm, s1 and s2 heart sounds present.  Lungs: Clear lung sounds in all lobes. Respirations equal and unlabored. Abdomen:  +BS, soft, non-tender and non-distended. No rebound or guarding. No HSM or masses noted. Derm: No palmar erythema or jaundice Msk:  Symmetrical without gross deformities. Normal posture. Extremities:  Without edema. Neurologic:  Alert and  oriented x4 Psych:  Alert and cooperative. Normal mood and affect.  Invalid input(s): 6 MONTHS   ASSESSMENT: [name] is a 78 y.o. female presenting today for hospital follow up of rectal bleeding and anemia secondary to large hiatal hernia and cameron ulcers  Recent admission with anemia, rectal bleeding, melena. Underwent EGD and Colonoscopy with findings of cameron ulcers secondary to large, 10cm hiatal hernia. Recommend to have surgical evaluation for repair of hernia. She is taking low dose PPI daily and iron daily, last hgb in mid August was WNL. No further rectal bleeding or melena. Endorses some sensation of foods sitting in mid chest and some soreness across her upper abdomen, though she does think she may have broken some ribs when she had syncopal episode and fell, prior to her admission. Pain may also be secondary to large hiatal hernia with intrathoracic stomach. She would like to find out the surgeon who did her [facility] repair and let me know who that is so she can be referred there. For now, will continue PPI bid, daily iron. Will check iron studies as these were not done in the hospital and most recent hemoglobin was normal.    PLAN:  -continue daily iron pill for now -check iron  studies -referral to surgery for hiatal hernia repair (pt to make me aware of her preferred surgeon) -continue PPI BID -eat smaller more frequent meals, vs 3 larger meals per day   All questions were answered, patient verbalized understanding and is in agreement with plan as outlined above.   Follow Up: 4 months   Dylan Ruotolo L. Mariette, MSN, APRN, AGNP-C Adult-Gerontology Nurse Practitioner [facility]  I have reviewed the note and agree with the APP's assessment as described in this progress note  Toribio Fortune, MD Gastroenterology and Hepatology [facility]

## 2024-09-09 DIAGNOSIS — D649 Anemia, unspecified: Secondary | ICD-10-CM | POA: Diagnosis not present

## 2024-09-10 ENCOUNTER — Ambulatory Visit (INDEPENDENT_AMBULATORY_CARE_PROVIDER_SITE_OTHER): Payer: Self-pay | Admitting: Gastroenterology

## 2024-09-10 ENCOUNTER — Encounter (INDEPENDENT_AMBULATORY_CARE_PROVIDER_SITE_OTHER): Payer: Self-pay

## 2024-09-10 LAB — IRON,TIBC AND FERRITIN PANEL
Ferritin: 16 ng/mL (ref 15–150)
Iron Saturation: 7 % — CL (ref 15–55)
Iron: 27 ug/dL (ref 27–139)
Total Iron Binding Capacity: 408 ug/dL (ref 250–450)
UIBC: 381 ug/dL — ABNORMAL HIGH (ref 118–369)

## 2024-09-11 NOTE — Progress Notes (Signed)
 Referral sent, they will contact patient with apt

## 2024-09-25 ENCOUNTER — Telehealth: Payer: Self-pay

## 2024-09-25 DIAGNOSIS — K449 Diaphragmatic hernia without obstruction or gangrene: Secondary | ICD-10-CM | POA: Diagnosis not present

## 2024-09-25 DIAGNOSIS — K253 Acute gastric ulcer without hemorrhage or perforation: Secondary | ICD-10-CM | POA: Diagnosis not present

## 2024-09-25 DIAGNOSIS — D500 Iron deficiency anemia secondary to blood loss (chronic): Secondary | ICD-10-CM | POA: Diagnosis not present

## 2024-09-25 NOTE — Telephone Encounter (Signed)
   Pre-operative Risk Assessment    Patient Name: [name]  DOB: 1946/01/16 MRN: [id number]   Date of last office visit: 03/07/24 DIANNAH MAYWOOD, MD Date of next office visit: NONE   Request for Surgical Clearance    Procedure:  HIATAL HERNIA SURGERY  Date of Surgery:  Clearance TBD                                Surgeon:  [name], MD Surgeon's Group or Practice Name:  [facility] Phone number:  [phone] Fax number:  [phone]  ATTN: ROSELINE ARGYLE, CMA   Type of Clearance Requested:   - Medical  - Pharmacy:  Hold Rivaroxaban  (Xarelto )     Type of Anesthesia:  General    Additional requests/questions:    Signed, Lucie DELENA Ku   09/25/2024, 11:36 AM

## 2024-09-27 ENCOUNTER — Inpatient Hospital Stay: Attending: Hematology and Oncology | Admitting: Hematology and Oncology

## 2024-09-27 ENCOUNTER — Inpatient Hospital Stay

## 2024-09-27 ENCOUNTER — Other Ambulatory Visit (HOSPITAL_COMMUNITY): Payer: Self-pay | Admitting: Family Medicine

## 2024-09-27 VITALS — BP 122/75 | HR 84 | Temp 98.3°F | Resp 18 | Wt 173.0 lb

## 2024-09-27 DIAGNOSIS — D509 Iron deficiency anemia, unspecified: Secondary | ICD-10-CM | POA: Diagnosis not present

## 2024-09-27 DIAGNOSIS — Z862 Personal history of diseases of the blood and blood-forming organs and certain disorders involving the immune mechanism: Secondary | ICD-10-CM

## 2024-09-27 DIAGNOSIS — Z1231 Encounter for screening mammogram for malignant neoplasm of breast: Secondary | ICD-10-CM

## 2024-09-27 LAB — CBC WITH DIFFERENTIAL/PLATELET
Abs Immature Granulocytes: 0.02 K/uL (ref 0.00–0.07)
Basophils Absolute: 0.1 K/uL (ref 0.0–0.1)
Basophils Relative: 1 %
Eosinophils Absolute: 0.3 K/uL (ref 0.0–0.5)
Eosinophils Relative: 4 %
HCT: 39.6 % (ref 36.0–46.0)
Hemoglobin: 12.3 g/dL (ref 12.0–15.0)
Immature Granulocytes: 0 %
Lymphocytes Relative: 21 %
Lymphs Abs: 1.4 K/uL (ref 0.7–4.0)
MCH: 28.1 pg (ref 26.0–34.0)
MCHC: 31.1 g/dL (ref 30.0–36.0)
MCV: 90.6 fL (ref 80.0–100.0)
Monocytes Absolute: 0.6 K/uL (ref 0.1–1.0)
Monocytes Relative: 8 %
Neutro Abs: 4.6 K/uL (ref 1.7–7.7)
Neutrophils Relative %: 66 %
Platelets: 292 K/uL (ref 150–400)
RBC: 4.37 MIL/uL (ref 3.87–5.11)
RDW: 16.1 % — ABNORMAL HIGH (ref 11.5–15.5)
WBC: 6.9 K/uL (ref 4.0–10.5)
nRBC: 0 % (ref 0.0–0.2)

## 2024-09-27 LAB — IRON AND TIBC
Iron: 54 ug/dL (ref 28–170)
Saturation Ratios: 13 % (ref 10.4–31.8)
TIBC: 417 ug/dL (ref 250–450)
UIBC: 363 ug/dL

## 2024-09-27 LAB — FERRITIN: Ferritin: 23 ng/mL (ref 11–307)

## 2024-09-27 NOTE — Assessment & Plan Note (Signed)
 Lab review: 08/02/2024: Hemoglobin 8.6, MCV 94.5, RDW 15.8 (acute GI bleeding hospitalization, anticoagulation for A-fib) 09/09/2024: Hemoglobin 11.2, MCV 95, RDW 13.6, iron saturation 7%, TIBC 408, ferritin 16  Discussion: I discussed with the patient the cause of iron deficiency is clearly from the recent GI bleeding precipitated by being on blood thinners.  Patient appears to have responded to treatment with improvement in the hemoglobin.  Currently on iron replacement therapy orally twice a day.  Recommendation: Given the considerable low levels of iron saturation, I recommended IV iron therapy.  I discussed with her that if the bleeding subsides she would not need regular infusions of IV iron.  However if she has persistent low-grade bleeding possibly in the future she might need additional iron infusions.  Plan: IV iron Follow-up with labs in 3 months

## 2024-09-27 NOTE — Progress Notes (Signed)
 [facility] CONSULT NOTE  Patient Care Team: Marvine Rush, MD as PCP - General (Family Medicine) Mallipeddi, Diannah SQUIBB, MD as PCP - Cardiology (Cardiology) Kennyth Chew, MD as PCP - Electrophysiology (Cardiology)  CHIEF COMPLAINTS/PURPOSE OF CONSULTATION:  Consultation for iron deficiency anemia  HISTORY OF PRESENTING ILLNESS: Ms. Claassen is a 78 year old who recently noticed blood in the stool and lost consciousness requiring hospitalization.  She was hospitalized from 07/30/2024 to 08/02/2024.  GI evaluation with upper endoscopy revealed Cameron ulcers and multiple gastric polyps.  Colonoscopy showed nonbleeding internal hemorrhoids.  Her hemoglobin in the hospital was 6.1 and after transfusion it has improved to 8.4.  She was placed on oral iron therapy and recently it was increased to 2 tablets a day.  She has been feeling significantly better and the most recent hemoglobin was 11.2 on 08/09/2024.  She was referred to us  for discussion regarding IV iron therapy.     I reviewed her records extensively and collaborated the history with the patient.   MEDICAL HISTORY:  Past Medical History:  Diagnosis Date   Anxiety 12/29/2022   Arthritis    Asthma    Carpal tunnel syndrome    Depression    Dyspnea    Dysrhythmia    Gastric ulcer    GERD (gastroesophageal reflux disease)    H/O hiatal hernia    Heart murmur    since birth   History of heart murmur in childhood    History of kidney stones    Hypertension    Iron deficiency anemia    Neuropathy    Following neck surgery.   Paralysis (HCC)    PONV (postoperative nausea and vomiting)    Primary localized osteoarthritis of right knee 02/06/2024   Pulmonary embolus [facility])    November 2020   Type 2 diabetes mellitus (HCC)     SURGICAL HISTORY: Past Surgical History:  Procedure Laterality Date   APPENDECTOMY     ATRIAL FIBRILLATION ABLATION N/A 10/09/2023   Procedure: ATRIAL FIBRILLATION ABLATION;  Surgeon:  Kennyth Chew, MD;  Location: [facility] INVASIVE CV LAB;  Service: Cardiovascular;  Laterality: N/A;   BIOPSY  06/01/2022   Procedure: BIOPSY;  Surgeon: Golda Claudis PENNER, MD;  [facility];  Service: Endoscopy;;   CHOLECYSTECTOMY     COLONOSCOPY     COLONOSCOPY N/A 09/26/2013   Procedure: COLONOSCOPY;  Surgeon: Claudis PENNER Golda, MD;  [facility]: [facility];  Service: Endoscopy;  Laterality: N/A;  1030-rescheduled to 12:00pm Ann notified pt   COLONOSCOPY N/A 03/27/2015   Procedure: COLONOSCOPY;  Surgeon: Claudis PENNER Golda, MD;  [facility]: [facility];  Service: Endoscopy;  Laterality: N/A;  11:15 - moved to 8:25 - Ann to notify pt   COLONOSCOPY N/A 08/01/2024   Procedure: COLONOSCOPY;  Surgeon: Cinderella Deatrice FALCON, MD;  [facility];  Service: Endoscopy;  Laterality: N/A;   COLONOSCOPY WITH PROPOFOL  N/A 06/01/2022   Procedure: COLONOSCOPY WITH PROPOFOL ;  Surgeon: Golda Claudis PENNER, MD;  [facility];  Service: Endoscopy;  Laterality: N/A;  1005 ASA 2   CYSTOSCOPY/URETEROSCOPY/HOLMIUM LASER/STENT PLACEMENT Left 12/23/2022   Procedure: CYSTOSCOPY/ LEFT RETROGRADE/ URETEROSCOPY/HOLMIUM LASER/STENT PLACEMENT;  Surgeon: Nieves Cough, MD;  Location: [facility] ORS;  Service: Urology;  Laterality: Left;   ESOPHAGOGASTRODUODENOSCOPY  12/08/2011   Procedure: ESOPHAGOGASTRODUODENOSCOPY (EGD);  Surgeon: Claudis PENNER Golda, MD;  [facility];  Service: Endoscopy;  Laterality: N/A;  3:00    ESOPHAGOGASTRODUODENOSCOPY N/A 03/27/2015   Procedure: ESOPHAGOGASTRODUODENOSCOPY (EGD);  Surgeon: Claudis RAYMOND Rivet, MD;  [facility];  Service: Endoscopy;  Laterality: N/A;   ESOPHAGOGASTRODUODENOSCOPY N/A 08/01/2024   Procedure: EGD (ESOPHAGOGASTRODUODENOSCOPY);  Surgeon: Cinderella Deatrice FALCON, MD;  [facility];  Service: Endoscopy;  Laterality: N/A;   EYE SURGERY Bilateral    cataract surgery   GIVENS CAPSULE STUDY N/A 11/04/2015   Procedure: GIVENS CAPSULE STUDY;  Surgeon: Claudis RAYMOND Rivet, MD;  [facility];  Service: Endoscopy;  Laterality: N/A;   Hysterscopy     KNEE ARTHROSCOPY     Left   LEFT HEART CATH AND CORONARY ANGIOGRAPHY N/A 05/30/2024   Procedure: LEFT HEART CATH AND CORONARY ANGIOGRAPHY;  Surgeon: Mady Bruckner, MD;  Location: [facility] INVASIVE CV LAB;  Service: Cardiovascular;  Laterality: N/A;   NECK SURGERY     x 2    TONSILLECTOMY     TOTAL KNEE ARTHROPLASTY  07/11/2012   Procedure: TOTAL KNEE ARTHROPLASTY;  Surgeon: Toribio FALCON Chancy, MD;  Location: [facility] OR;  Service: Orthopedics;  Laterality: Left;  left total knee arthroplasty   TOTAL KNEE ARTHROPLASTY Right 02/06/2024   Procedure: TOTAL KNEE ARTHROPLASTY;  Surgeon: Josefina Chew, MD;  Location: [facility];  Service: Orthopedics;  Laterality: Right;   UPPER GASTROINTESTINAL ENDOSCOPY     YAG LASER APPLICATION Right 02/21/2017   Procedure: YAG LASER APPLICATION;  Surgeon: Oneil Platts, MD;  Location: [facility];  Service: Ophthalmology;  Laterality: Right;    SOCIAL HISTORY: Social History   Socioeconomic History   Marital status: Married    Spouse name: Not on file   Number of children: Not on file   Years of education: Not on file   Highest education level: Not on file  Occupational History   Not on file  Tobacco Use   Smoking status: Never    Passive exposure: Never   Smokeless tobacco: Never  Vaping Use   Vaping status: Never Used  Substance and Sexual Activity   Alcohol use: No    Alcohol/week: 0.0 standard drinks of alcohol   Drug use: No   Sexual activity: Not Currently    Birth control/protection: Post-menopausal  Other Topics Concern   Not on file  Social History Narrative   Not on file   Social Drivers of Health   Financial Resource Strain: Low Risk  (06/25/2021)   Overall Financial Resource Strain (CARDIA)    Difficulty of Paying Living Expenses: Not hard at all  Food Insecurity: No Food Insecurity (08/06/2024)   Hunger Vital Sign    Worried About Running Out of Food in  the Last Year: Never true    Ran Out of Food in the Last Year: Never true  Transportation Needs: No Transportation Needs (08/06/2024)   PRAPARE - Administrator, Civil Service (Medical): No    Lack of Transportation (Non-Medical): No  Physical Activity: Insufficiently Active (06/25/2021)   Exercise Vital Sign    Days of Exercise per Week: 2 days    Minutes of Exercise per Session: 20 min  Stress: No Stress Concern Present (06/25/2021)   [employer] of Occupational Health - Occupational Stress Questionnaire    Feeling of Stress : Not at all  Social Connections: Socially Integrated (07/30/2024)   Social Connection and Isolation Panel    Frequency of Communication with Friends and Family: More than three times a week    Frequency of Social Gatherings with Friends and Family: More than three times a week    Attends Religious Services: 1  to 4 times per year    Active Member of Clubs or Organizations: Yes    Attends Banker Meetings: More than 4 times per year    Marital Status: Married  Catering manager Violence: Not At Risk (08/06/2024)   Humiliation, Afraid, Rape, and Kick questionnaire    Fear of Current or Ex-Partner: No    Emotionally Abused: No    Physically Abused: No    Sexually Abused: No    FAMILY HISTORY: Family History  Problem Relation Age of Onset   Alzheimer's disease Mother    Kidney disease Mother    Kidney disease Father    Diabetes Maternal Grandmother    Diabetes Maternal Grandfather    Cancer Paternal Grandmother        breast, liver   Hypertension Son    Cancer Maternal Aunt        leukemia   Liver disease Maternal Uncle    Kidney disease Paternal Uncle        kidney removed   Colon cancer Neg Hx     ALLERGIES:  is allergic to cephalexin, nsaids, darvocet [propoxyphene n-acetaminophen ], penicillins, and percocet [oxycodone -acetaminophen ].  MEDICATIONS:  Current Outpatient Medications  Medication Sig Dispense Refill    acetaminophen  (TYLENOL ) 500 MG tablet Take 1,000 mg by mouth every 6 (six) hours as needed for mild pain (pain score 1-3) or headache.     cholecalciferol  (VITAMIN D3) 25 MCG (1000 UNIT) tablet Take 1,000 Units by mouth daily.     cyanocobalamin (VITAMIN B12) 1000 MCG tablet Take 1,000 mcg by mouth daily.     escitalopram  (LEXAPRO ) 20 MG tablet Take 20 mg by mouth daily.     ferrous sulfate  325 (65 FE) MG tablet Take 1 tablet (325 mg total) by mouth daily with breakfast. (Patient taking differently: Take 325 mg by mouth 2 (two) times daily with a meal. Changed to bid on 09/10/2024 per Mitzie Boettcher, NP.)     furosemide  (LASIX ) 20 MG tablet TAKE 1 TABLET BY MOUTH TWICE DAILY.TAKE AN ADDITIONAL TABLET DAILY AS NEEDED FOR SHORTNESS OF BREATH 180 tablet 1   gabapentin  (NEURONTIN ) 300 MG capsule Take 300 mg by mouth at bedtime.     KLOR-CON  M20 20 MEQ tablet TAKE 1 TABLET BY MOUTH EVERY DAY 90 tablet 1   metFORMIN (GLUCOPHAGE) 500 MG tablet Take 500 mg by mouth 2 (two) times daily with a meal.      montelukast  (SINGULAIR ) 10 MG tablet Take 10 mg by mouth every morning.     omeprazole  (PRILOSEC) 20 MG capsule Take 20 mg by mouth 2 (two) times daily before a meal.     Phenazopyrid-Cranbry-C-Probiot (AZO URINARY TRACT SUPPORT PO) Take 2 tablets by mouth daily.     Probiotic Product (PROBIOTIC DAILY PO) Take 1 capsule by mouth daily.     rivaroxaban  (XARELTO ) 20 MG TABS tablet Take 20 mg by mouth daily with supper.     Turmeric 450 MG CAPS Take 450 mg by mouth daily.     zolpidem  (AMBIEN ) 10 MG tablet Take 10 mg by mouth at bedtime as needed for sleep.     No current facility-administered medications for this visit.    REVIEW OF SYSTEMS:   Constitutional: Denies fevers, chills or abnormal night sweats All other systems were reviewed with the patient and are negative.  PHYSICAL EXAMINATION: ECOG PERFORMANCE STATUS: 0 - Asymptomatic  Vitals:   09/27/24 1028  BP: 122/75  Pulse: 84  Resp: 18   Temp: 98.3  F (36.8 C)  SpO2: 95%   Filed Weights   09/27/24 1028  Weight: 173 lb (78.5 kg)    GENERAL:alert, no distress and comfortable  LABORATORY DATA:  I have reviewed the data as listed Lab Results  Component Value Date   WBC 7.1 08/09/2024   HGB 11.2 08/09/2024   HCT 36.5 08/09/2024   MCV 95 08/09/2024   PLT 396 08/09/2024   Lab Results  Component Value Date   NA 141 08/02/2024   K 4.0 08/02/2024   CL 108 08/02/2024   CO2 26 08/02/2024    RADIOGRAPHIC STUDIES: I have personally reviewed the radiological reports and agreed with the findings in the report.  ASSESSMENT AND PLAN:  History of iron deficiency anemia Lab review: 08/02/2024: Hemoglobin 8.6, MCV 94.5, RDW 15.8 (acute GI bleeding hospitalization, anticoagulation for A-fib) 09/09/2024: Hemoglobin 11.2, MCV 95, RDW 13.6, iron saturation 7%, TIBC 408, ferritin 16  Discussion: I discussed with the patient the cause of iron deficiency is clearly from the recent GI bleeding precipitated by being on blood thinners.  Patient appears to have responded to treatment with improvement in the hemoglobin.  Currently on iron replacement therapy orally twice a day.  Recommendation: Given the considerable low levels of iron saturation, I recommended IV iron therapy.    However patient would like to wait for today's blood work results before agreeing to IV iron.  She is tolerating the iron pills extremely well and she would rather continue with the oral iron therapy and then watch and monitor.  Plan: Await iron test results and I will call her with these results Follow-up with labs in 3 months      All questions were answered. The patient knows to call the clinic with any problems, questions or concerns.    Viinay K Jarrod Mcenery, MD 09/27/24

## 2024-09-29 NOTE — Telephone Encounter (Signed)
 Patient with diagnosis of atrial fibrillation on Xarelto  for anticoagulation.    Procedure:  HIATAL HERNIA SURGERY   Date of Surgery:  Clearance TBD     CHA2DS2-VASc Score = 6   This indicates a 9.7% annual risk of stroke. The patient's score is based upon: CHF History: 1 HTN History: 1 Diabetes History: 1 Stroke History: 0 Vascular Disease History: 0 Age Score: 2 Gender Score: 1    CrCl 74 Platelet count 292  Chart indicated history of PE, provoked 10/2019  Patient has not  an Afib/aflutter ablation or Watchman within the last 3 months or DCCV within the last 30 days   Per office protocol, patient can hold Xarelto  for 2 days prior to procedure.   Patient will not need bridging with Lovenox  (enoxaparin ) around procedure.  **This guidance is not considered finalized until pre-operative APP has relayed final recommendations.**

## 2024-09-30 ENCOUNTER — Telehealth: Payer: Self-pay

## 2024-09-30 NOTE — Telephone Encounter (Signed)
   Pre-operative Risk Assessment    Patient Name: [name]  DOB: 1946/01/12 MRN: [id number]  Date of last office visit: 03/07/24  Dr. Mallipeddi  Date of next office visit: NA    Request for Surgical Clearance    Procedure:  Hiatal Hernia Surgery  Date of Surgery:  Clearance TBD                               Surgeon:  [name], MD Surgeon's Group or Practice Name:  [facility] Phone number:  [phone] Fax number:  [phone]  Roseline Argyle, CMA   Type of Clearance Requested:   - Medical  - Pharmacy:  Hold Rivaroxaban  (Xarelto )     Type of Anesthesia:  General    Additional requests/questions:    Bonney Arlyne LITTIE Kallie   09/30/2024, 4:56 PM

## 2024-10-01 NOTE — Telephone Encounter (Signed)
   Name: [name]  DOB: 02/09/1946  MRN: [id number]  Primary Cardiologist: Vishnu P Mallipeddi, MD   Preoperative team, please contact this patient and set up a phone call appointment for further preoperative risk assessment. Please obtain consent and complete medication review. Thank you for your help.  I confirm that guidance regarding antiplatelet and oral anticoagulation therapy has been completed and, if necessary, noted below.  Patient has not  an Afib/aflutter ablation or Watchman within the last 3 months or DCCV within the last 30 days    Per office protocol, patient can hold Xarelto  for 2 days prior to procedure.   Patient will not need bridging with Lovenox  (enoxaparin ) around procedure.  I also confirmed the patient resides in the state of [place] . As per [facility] Medical Board telemedicine laws, the patient must reside in the state in which the provider is licensed.    Barnie Hila, NP 10/01/2024, 7:56 AM [facility]

## 2024-10-01 NOTE — Telephone Encounter (Signed)
 1st attempt : Called patient, NA, mailbox is full and cannot receive any messages at this time.

## 2024-10-02 ENCOUNTER — Telehealth (HOSPITAL_BASED_OUTPATIENT_CLINIC_OR_DEPARTMENT_OTHER): Payer: Self-pay | Admitting: *Deleted

## 2024-10-02 NOTE — Telephone Encounter (Signed)
 Pt has been scheduled tele preop appt 10/08/24. Per pt the surgeon is waiting on clearance before scheduling surgery. Med rec and consent are done.      Patient Consent for Virtual Visit        [name] has provided verbal consent on 10/02/2024 for a virtual visit (video or telephone).   CONSENT FOR VIRTUAL VISIT FOR:  [name]  By participating in this virtual visit I agree to the following:  I hereby voluntarily request, consent and authorize [facility] and its employed or contracted physicians, physician assistants, nurse practitioners or other licensed health care professionals (the Practitioner), to provide me with telemedicine health care services (the "Services) as deemed necessary by the treating Practitioner. I acknowledge and consent to receive the Services by the Practitioner via telemedicine. I understand that the telemedicine visit will involve communicating with the Practitioner through live audiovisual communication technology and the disclosure of certain medical information by electronic transmission. I acknowledge that I have been given the opportunity to request an in-person assessment or other available alternative prior to the telemedicine visit and am voluntarily participating in the telemedicine visit.  I understand that I have the right to withhold or withdraw my consent to the use of telemedicine in the course of my care at any time, without affecting my right to future care or treatment, and that the Practitioner or I may terminate the telemedicine visit at any time. I understand that I have the right to inspect all information obtained and/or recorded in the course of the telemedicine visit and may receive copies of available information for a reasonable fee.  I understand that some of the potential risks of receiving the Services via telemedicine include:  Delay or interruption in medical evaluation due to technological equipment failure or  disruption; Information transmitted may not be sufficient (e.g. poor resolution of images) to allow for appropriate medical decision making by the Practitioner; and/or  In rare instances, security protocols could fail, causing a breach of personal health information.  Furthermore, I acknowledge that it is my responsibility to provide information about my medical history, conditions and care that is complete and accurate to the best of my ability. I acknowledge that Practitioner's advice, recommendations, and/or decision may be based on factors not within their control, such as incomplete or inaccurate data provided by me or distortions of diagnostic images or specimens that may result from electronic transmissions. I understand that the practice of medicine is not an exact science and that Practitioner makes no warranties or guarantees regarding treatment outcomes. I acknowledge that a copy of this consent can be made available to me via my patient portal [facility] MyChart), or I can request a printed copy by calling the office of [facility].    I understand that my insurance will be billed for this visit.   I have read or had this consent read to me. I understand the contents of this consent, which adequately explains the benefits and risks of the Services being provided via telemedicine.  I have been provided ample opportunity to ask questions regarding this consent and the Services and have had my questions answered to my satisfaction. I give my informed consent for the services to be provided through the use of telemedicine in my medical care

## 2024-10-02 NOTE — Telephone Encounter (Signed)
 Pt has been scheduled tele preop appt 10/08/24. Per pt the surgeon is waiting on clearance before scheduling surgery. Med rec and consent are done.

## 2024-10-07 ENCOUNTER — Ambulatory Visit (HOSPITAL_COMMUNITY)
Admission: RE | Admit: 2024-10-07 | Discharge: 2024-10-07 | Disposition: A | Discharge status: Home / Self Care | Source: Ambulatory Visit | Attending: Family Medicine | Admitting: Family Medicine

## 2024-10-07 DIAGNOSIS — Z1231 Encounter for screening mammogram for malignant neoplasm of breast: Secondary | ICD-10-CM | POA: Diagnosis not present

## 2024-10-08 ENCOUNTER — Ambulatory Visit: Attending: Cardiology | Admitting: Physician Assistant

## 2024-10-08 DIAGNOSIS — Z01810 Encounter for preprocedural cardiovascular examination: Secondary | ICD-10-CM | POA: Diagnosis not present

## 2024-10-08 NOTE — Progress Notes (Signed)
 Virtual Visit via Telephone Note   Because of [name] co-morbid illnesses, she is at least at moderate risk for complications without adequate follow up.  This format is felt to be most appropriate for this patient at this time.  Due to technical limitations with video connection (technology), today's appointment will be conducted as an audio only telehealth visit, and [name] verbally agreed to proceed in this manner.   All issues noted in this document were discussed and addressed.  No physical exam could be performed with this format.  Evaluation Performed:  Preoperative cardiovascular risk assessment _____________   Date:  10/08/2024   Patient ID:  [name], DOB August 08, 1946, MRN [id number] Patient Location:  Home Provider location:   Office  Primary Care Provider:  Marvine Rush, MD Primary Cardiologist:  Diannah SHAUNNA Maywood, MD  Chief Complaint / Patient Profile   78 y.o. y/o female with a h/o chronic diastolic heart failure, paroxysmal atrial flutter, history of PE, DM2, and asthma who is pending hiatal hernia surgery and presents today for telephonic preoperative cardiovascular risk assessment.  History of Present Illness    [name] is a 78 y.o. female who presents via audio/video conferencing for a telehealth visit today.  Pt was last seen in cardiology clinic on 03/07/2024 by Dr. Mallipeddi.  At that time [name] was doing well .  The patient is now pending procedure as outlined above. Since her last visit, she tells me that she has been doing great with her heart.  She was in the hospital 8/8 with a GI bleed.  She states she has Ole lesions in her stomach which leads to bleeding and this is all caused by her hiatal hernia.  She states that she gets her hiatal hernia fixed she should not have any further bleeding.  Only medication change is that they doubled her iron intake to help her hemoglobin and told her to take it all at 1 time at lunch so  that it does not interact with her other medicines.  She does meet METS for her physical activity level.  She enjoys shopping as a past time.   Per office protocol, patient can hold Xarelto  for 2 days prior to procedure.  Please resume when medically safe to do so. Patient will not need bridging with Lovenox  (enoxaparin ) around procedure.  She is not on baby ASA.   Cardiac Cath 05/30/24 Diagnostic Dominance: Right Left Main  Vessel is large. Vessel is angiographically normal.    Left Anterior Descending  Vessel is large.    First Diagonal Branch  Vessel is large in size.    Left Circumflex  Vessel is large. Vessel is angiographically normal.    First Obtuse Marginal Branch  Vessel is moderate in size.    Second Obtuse Marginal Branch  Vessel is small in size.    Third Obtuse Marginal Branch  Vessel is moderate in size.    Fourth Obtuse Marginal Branch  Vessel is small in size.    Right Coronary Artery  Vessel is large. Vessel is angiographically normal.    Right Posterior Descending Artery  Vessel is small in size.    Right Posterior Atrioventricular Artery  Vessel is moderate in size.    First Right Posterolateral Branch  Vessel is moderate in size.    Second Right Posterolateral Branch  Vessel is small in size.    Intervention   No interventions have been documented.   Left Heart  Left Ventricle LV end diastolic pressure is normal. LVEDP 12 mmHg.  Aortic Valve There is no aortic valve stenosis.   Coronary Diagrams  Diagnostic Dominance: Right  Intervention   Past Medical History    Past Medical History:  Diagnosis Date   Anxiety 12/29/2022   Arthritis    Asthma    Carpal tunnel syndrome    Depression    Dyspnea    Dysrhythmia    Gastric ulcer    GERD (gastroesophageal reflux disease)    H/O hiatal hernia    Heart murmur    since birth   History of heart murmur in childhood    History of kidney stones    Hypertension    Iron  deficiency anemia    Neuropathy    Following neck surgery.   Paralysis (HCC)    PONV (postoperative nausea and vomiting)    Primary localized osteoarthritis of right knee 02/06/2024   Pulmonary embolus [facility])    November 2020   Type 2 diabetes mellitus [facility])    Past Surgical History:  Procedure Laterality Date   APPENDECTOMY     ATRIAL FIBRILLATION ABLATION N/A 10/09/2023   Procedure: ATRIAL FIBRILLATION ABLATION;  Surgeon: Kennyth Chew, MD;  Location: [facility] INVASIVE CV LAB;  Service: Cardiovascular;  Laterality: N/A;   BIOPSY  06/01/2022   Procedure: BIOPSY;  Surgeon: Golda Claudis PENNER, MD;  [facility];  Service: Endoscopy;;   CHOLECYSTECTOMY     COLONOSCOPY     COLONOSCOPY N/A 09/26/2013   Procedure: COLONOSCOPY;  Surgeon: Claudis PENNER Golda, MD;  [facility]: [facility];  Service: Endoscopy;  Laterality: N/A;  1030-rescheduled to 12:00pm Ann notified pt   COLONOSCOPY N/A 03/27/2015   Procedure: COLONOSCOPY;  Surgeon: Claudis PENNER Golda, MD;  [facility]: [facility];  Service: Endoscopy;  Laterality: N/A;  11:15 - moved to 8:25 - Ann to notify pt   COLONOSCOPY N/A 08/01/2024   Procedure: COLONOSCOPY;  Surgeon: Cinderella Deatrice FALCON, MD;  [facility];  Service: Endoscopy;  Laterality: N/A;   COLONOSCOPY WITH PROPOFOL  N/A 06/01/2022   Procedure: COLONOSCOPY WITH PROPOFOL ;  Surgeon: Golda Claudis PENNER, MD;  [facility];  Service: Endoscopy;  Laterality: N/A;  1005 ASA 2   CYSTOSCOPY/URETEROSCOPY/HOLMIUM LASER/STENT PLACEMENT Left 12/23/2022   Procedure: CYSTOSCOPY/ LEFT RETROGRADE/ URETEROSCOPY/HOLMIUM LASER/STENT PLACEMENT;  Surgeon: Nieves Cough, MD;  Location: [facility] ORS;  Service: Urology;  Laterality: Left;   ESOPHAGOGASTRODUODENOSCOPY  12/08/2011   Procedure: ESOPHAGOGASTRODUODENOSCOPY (EGD);  Surgeon: Claudis PENNER Golda, MD;  [facility];  Service: Endoscopy;  Laterality: N/A;  3:00    ESOPHAGOGASTRODUODENOSCOPY N/A 03/27/2015   Procedure:  ESOPHAGOGASTRODUODENOSCOPY (EGD);  Surgeon: Claudis PENNER Golda, MD;  [facility];  Service: Endoscopy;  Laterality: N/A;   ESOPHAGOGASTRODUODENOSCOPY N/A 08/01/2024   Procedure: EGD (ESOPHAGOGASTRODUODENOSCOPY);  Surgeon: Cinderella Deatrice FALCON, MD;  [facility];  Service: Endoscopy;  Laterality: N/A;   EYE SURGERY Bilateral    cataract surgery   GIVENS CAPSULE STUDY N/A 11/04/2015   Procedure: GIVENS CAPSULE STUDY;  Surgeon: Claudis PENNER Golda, MD;  [facility];  Service: Endoscopy;  Laterality: N/A;   Hysterscopy     KNEE ARTHROSCOPY     Left   LEFT HEART CATH AND CORONARY ANGIOGRAPHY N/A 05/30/2024   Procedure: LEFT HEART CATH AND CORONARY ANGIOGRAPHY;  Surgeon: Mady Bruckner, MD;  Location: [facility] INVASIVE CV LAB;  Service: Cardiovascular;  Laterality: N/A;   NECK SURGERY     x 2  TONSILLECTOMY     TOTAL KNEE ARTHROPLASTY  07/11/2012   Procedure: TOTAL KNEE ARTHROPLASTY;  Surgeon: Toribio JULIANNA Chancy, MD;  Location: [facility] OR;  Service: Orthopedics;  Laterality: Left;  left total knee arthroplasty   TOTAL KNEE ARTHROPLASTY Right 02/06/2024   Procedure: TOTAL KNEE ARTHROPLASTY;  Surgeon: Josefina Chew, MD;  Location: [facility]S;  Service: Orthopedics;  Laterality: Right;   UPPER GASTROINTESTINAL ENDOSCOPY     YAG LASER APPLICATION Right 02/21/2017   Procedure: YAG LASER APPLICATION;  Surgeon: Oneil Platts, MD;  Location: [facility];  Service: Ophthalmology;  Laterality: Right;    Allergies  Allergies  Allergen Reactions   Cephalexin Other (See Comments)    Unknown    Nsaids Other (See Comments)    Caused Ulcers.   Darvocet [Propoxyphene N-Acetaminophen ] Nausea Only   Penicillins Rash    Immediate rash, facial/tongue/throat swelling, SOB or lightheadedness with hypotension    Percocet [Oxycodone -Acetaminophen ] Nausea Only    Home Medications    Prior to Admission medications   Medication Sig Start Date End Date Taking? Authorizing Provider  acetaminophen  (TYLENOL ) 500  MG tablet Take 1,000 mg by mouth every 6 (six) hours as needed for mild pain (pain score 1-3) or headache.    [provider]  cholecalciferol  (VITAMIN D3) 25 MCG (1000 UNIT) tablet Take 1,000 Units by mouth daily.    [provider]  cyanocobalamin (VITAMIN B12) 1000 MCG tablet Take 1,000 mcg by mouth daily.    [provider]  escitalopram  (LEXAPRO ) 20 MG tablet Take 20 mg by mouth daily. 05/23/24   [provider]  ferrous sulfate  325 (65 FE) MG tablet Take 1 tablet (325 mg total) by mouth daily with breakfast. Patient taking differently: Take 325 mg by mouth 2 (two) times daily with a meal. Changed to bid on 09/10/2024 per Mitzie Boettcher, NP. 10/25/16   Rehman, Claudis PENNER, MD  furosemide  (LASIX ) 20 MG tablet TAKE 1 TABLET BY MOUTH TWICE DAILY.TAKE AN ADDITIONAL TABLET DAILY AS NEEDED FOR SHORTNESS OF BREATH 07/01/24   Mallipeddi, Vishnu P, MD  gabapentin  (NEURONTIN ) 300 MG capsule Take 300 mg by mouth at bedtime.    [provider]  KLOR-CON  M20 20 MEQ tablet TAKE 1 TABLET BY MOUTH EVERY DAY 05/02/24   Mallipeddi, Vishnu P, MD  metFORMIN (GLUCOPHAGE) 500 MG tablet Take 500 mg by mouth 2 (two) times daily with a meal.     [provider]  montelukast  (SINGULAIR ) 10 MG tablet Take 10 mg by mouth every morning.    [provider]  omeprazole  (PRILOSEC) 20 MG capsule Take 20 mg by mouth 2 (two) times daily before a meal.    [provider]  Phenazopyrid-Cranbry-C-Probiot (AZO URINARY TRACT SUPPORT PO) Take 2 tablets by mouth daily.    [provider]  Probiotic Product (PROBIOTIC DAILY PO) Take 1 capsule by mouth daily.    [provider]  rivaroxaban  (XARELTO ) 20 MG TABS tablet Take 20 mg by mouth daily with supper.    [provider]  Turmeric 450 MG CAPS Take 450 mg by mouth daily.    [provider]  zolpidem  (AMBIEN ) 10 MG tablet Take 10 mg by mouth at bedtime as needed for sleep. 04/25/24    [provider]    Physical Exam    Vital Signs:  [name] does not have vital signs available for review today.  Given telephonic nature of communication, physical exam is limited. AAOx3. NAD. Normal affect.  Speech and  respirations are unlabored.  Accessory Clinical Findings    None  Assessment & Plan    1.  Preoperative Cardiovascular Risk Assessment:   [name] perioperative risk of a major cardiac event is 0.9% according to the Revised Cardiac Risk Index (RCRI).  Therefore, she is at low risk for perioperative complications.   Her functional capacity is good at 5.07 METs according to the [facility] Activity Status Index (DASI). Recommendations: According to ACC/AHA guidelines, no further cardiovascular testing needed.  The patient may proceed to surgery at acceptable risk.   Antiplatelet and/or Anticoagulation Recommendations:  Xarelto  (Rivaroxaban ) can be held for 2 days prior to surgery.  Please resume post op when felt to be safe.     The patient was advised that if she develops new symptoms prior to surgery to contact our office to arrange for a follow-up visit, and she verbalized understanding.   A copy of this note will be routed to requesting surgeon.  Time:   Today, I have spent 8 minutes with the patient with telehealth technology discussing medical history, symptoms, and management plan.     Orren LOISE Fabry, PA-C  10/08/2024, 1:21 PM

## 2024-10-08 NOTE — Telephone Encounter (Signed)
 Dup first clearance has been addressed patient has been scheduled appointment for clearance already

## 2024-10-09 ENCOUNTER — Encounter (INDEPENDENT_AMBULATORY_CARE_PROVIDER_SITE_OTHER): Payer: Self-pay | Admitting: Gastroenterology

## 2024-10-14 ENCOUNTER — Ambulatory Visit: Payer: Self-pay | Admitting: Surgery

## 2024-10-14 MED ORDER — GENTAMICIN SULFATE 40 MG/ML IJ SOLN: 5.0000 mg/kg | INTRAVENOUS | Status: AC

## 2024-10-14 MED ORDER — DEXTROSE 5 % IV SOLN: 900.0000 mg | INTRAVENOUS | Status: AC

## 2024-10-14 NOTE — Progress Notes (Signed)
 Sent message, via epic in basket, requesting orders in epic from Careers adviser.

## 2024-10-18 NOTE — Progress Notes (Addendum)
 COVID Vaccine Completed:  Date of COVID positive in last 90 days:  PCP - Norleen General, MD Cardiologist - Diannah Maywood, MD Electrophysiologist- Fonda Kitty, MD  Cardiac clearance by Orren Fabry, PA 10/08/24 in Epic   Chest x-ray - 05/27/24 Epic EKG - 08/01/24 Epic Stress Test - 05/29/24 Epic ECHO - 05/28/24 Epic Cardiac Cath - 05/30/24 Epic Pacemaker/ICD device last checked:N/A Spinal Cord Stimulator:N/A  Bowel Prep - N/A  Sleep Study - yes CPAP - no per pt preference  Fasting Blood Sugar - 100-120 Checks Blood Sugar 1 times a day  Last dose of GLP1 agonist-  N/A GLP1 instructions:  Do not take after     Last dose of SGLT-2 inhibitors-  N/A SGLT-2 instructions:  Do not take after     Blood Thinner Instructions: Xarelto , hold 2 days Aspirin  Instructions:N/A Last Dose: 10/25/24 1900  Activity level: Can go up a flight of stairs and perform activities of daily living without stopping and without symptoms of chest pain. SOB with activity   Anesthesia review: CHF, PAF, HTN, OSA, DM2, anemia, slow to wake up post TKA  Patient denies shortness of breath, fever, cough and chest pain at PAT appointment  Patient verbalized understanding of instructions that were given to them at the PAT appointment. Patient was also instructed that they will need to review over the PAT instructions again at home before surgery.

## 2024-10-18 NOTE — Patient Instructions (Addendum)
 SURGICAL WAITING ROOM VISITATION  Patients having surgery or a procedure may have no more than 2 support people in the waiting area - these visitors may rotate.    Children under the age of 1 must have an adult with them who is not the patient.  Visitors with respiratory illnesses are discouraged from visiting and should remain at home.  If the patient needs to stay at the hospital during part of their recovery, the visitor guidelines for inpatient rooms apply. Pre-op nurse will coordinate an appropriate time for 1 support person to accompany patient in pre-op.  This support person may not rotate.    Please refer to the [facility] website for the visitor guidelines for Inpatients (after your surgery is over and you are in a regular room).    Your procedure is scheduled on: 10/28/24   Report to [facility] Main Entrance    Report to admitting at 9:45 AM   Call this number if you have problems the morning of surgery [phone]   Do not eat food :After Midnight.   After Midnight you may have the following liquids until 9:00 AM DAY OF SURGERY  Water  Non-Citrus Juices (without pulp, NO RED-Apple, White grape, White cranberry) Black Coffee (NO MILK/CREAM OR CREAMERS, sugar ok)  Clear Tea (NO MILK/CREAM OR CREAMERS, sugar ok) regular and decaf                             Plain Jell-O (NO RED)                                           Fruit ices (not with fruit pulp, NO RED)                                     Popsicles (NO RED)                                                               Sports drinks like Gatorade (NO RED)          If you have questions, please contact your surgeon's office.   FOLLOW BOWEL PREP AND ANY ADDITIONAL PRE OP INSTRUCTIONS YOU RECEIVED FROM YOUR SURGEON'S OFFICE!!!     Oral Hygiene is also important to reduce your risk of infection.                                    Remember - BRUSH YOUR TEETH THE MORNING OF SURGERY WITH YOUR REGULAR  TOOTHPASTE  DENTURES WILL BE REMOVED PRIOR TO SURGERY PLEASE DO NOT APPLY Poly grip OR ADHESIVES!!!   Stop all vitamins and herbal supplements 7 days before surgery.   Last dose of Xarelto  10/25/24.   Take these medicines the morning of surgery with A SIP OF WATER : Tylenol , Lexapro , Singulair , Omeprazole    DO NOT TAKE ANY ORAL DIABETIC MEDICATIONS DAY OF YOUR SURGERY  How to Manage Your Diabetes Before and After Surgery  Why is it important  to control my blood sugar before and after surgery? Improving blood sugar levels before and after surgery helps healing and can limit problems. A way of improving blood sugar control is eating a healthy diet by:  Eating less sugar and carbohydrates  Increasing activity/exercise  Talking with your doctor about reaching your blood sugar goals High blood sugars (greater than 180 mg/dL) can raise your risk of infections and slow your recovery, so you will need to focus on controlling your diabetes during the weeks before surgery. Make sure that the doctor who takes care of your diabetes knows about your planned surgery including the date and location.  How do I manage my blood sugar before surgery? Check your blood sugar at least 4 times a day, starting 2 days before surgery, to make sure that the level is not too high or low. Check your blood sugar the morning of your surgery when you wake up and every 2 hours until you get to the Short Stay unit. If your blood sugar is less than 70 mg/dL, you will need to treat for low blood sugar: Do not take insulin . Treat a low blood sugar (less than 70 mg/dL) with  cup of clear juice (cranberry or apple), 4 glucose tablets, OR glucose gel. Recheck blood sugar in 15 minutes after treatment (to make sure it is greater than 70 mg/dL). If your blood sugar is not greater than 70 mg/dL on recheck, call [phone] for further instructions. Report your blood sugar to the short stay nurse when you get to Short  Stay.  If you are admitted to the hospital after surgery: Your blood sugar will be checked by the staff and you will probably be given insulin  after surgery (instead of oral diabetes medicines) to make sure you have good blood sugar levels. The goal for blood sugar control after surgery is 80-180 mg/dL.   WHAT DO I DO ABOUT MY DIABETES MEDICATION?  Do not take oral diabetes medicines (pills) the morning of surgery.  Reviewed and Endorsed by [facility] Patient Education Committee, August 2015                              You may not have any metal on your body including hair pins, jewelry, and body piercing             Do not wear make-up, lotions, powders, perfumes, or deodorant  Do not wear nail polish including gel and S&S, artificial/acrylic nails, or any other type of covering on natural nails including finger and toenails. If you have artificial nails, gel coating, etc. that needs to be removed by a nail salon please have this removed prior to surgery or surgery may need to be canceled/ delayed if the surgeon/ anesthesia feels like they are unable to be safely monitored.   Do not shave  48 hours prior to surgery.    Do not bring valuables to the hospital. [facility] IS NOT             RESPONSIBLE   FOR VALUABLES.   Contacts, glasses, dentures or bridgework may not be worn into surgery.   Bring small overnight bag day of surgery.   DO NOT BRING YOUR HOME MEDICATIONS TO THE HOSPITAL. PHARMACY WILL DISPENSE MEDICATIONS LISTED ON YOUR MEDICATION LIST TO YOU DURING YOUR ADMISSION IN THE HOSPITAL!   Special Instructions: Bring a copy of your healthcare power of attorney and living will documents the  day of surgery if you haven't scanned them before.              Please read over the following fact sheets you were given: IF YOU HAVE QUESTIONS ABOUT YOUR PRE-OP INSTRUCTIONS PLEASE CALL [phone].   If you received a COVID test during your pre-op visit  it is requested  that you wear a mask when out in public, stay away from anyone that may not be feeling well and notify your surgeon if you develop symptoms. If you test positive for Covid or have been in contact with anyone that has tested positive in the last 10 days please notify you surgeon.    [facility] - Preparing for Surgery Before surgery, you can play an important role.  Because skin is not sterile, your skin needs to be as free of germs as possible.  You can reduce the number of germs on your skin by washing with CHG (chlorahexidine gluconate) soap before surgery.  CHG is an antiseptic cleaner which kills germs and bonds with the skin to continue killing germs even after washing. Please DO NOT use if you have an allergy to CHG or antibacterial soaps.  If your skin becomes reddened/irritated stop using the CHG and inform your nurse when you arrive at Short Stay. Do not shave (including legs and underarms) for at least 48 hours prior to the first CHG shower.  You may shave your face/neck.  Please follow these instructions carefully:  1.  Shower with CHG Soap the night before surgery ONLY (DO NOT USE THE SOAP THE MORNING OF SURGERY).  2.  If you choose to wash your hair, wash your hair first as usual with your normal  shampoo.  3.  After you shampoo, rinse your hair and body thoroughly to remove the shampoo.                             4.  Use CHG as you would any other liquid soap.  You can apply chg directly to the skin and wash.  Gently with a scrungie or clean washcloth.  5.  Apply the CHG Soap to your body ONLY FROM THE NECK DOWN.   Do   not use on face/ open                           Wound or open sores. Avoid contact with eyes, ears mouth and   genitals (private parts).                       Wash face,  Genitals (private parts) with your normal soap.             6.  Wash thoroughly, paying special attention to the area where your    surgery  will be performed.  7.  Thoroughly rinse your body with warm  water  from the neck down.  8.  DO NOT shower/wash with your normal soap after using and rinsing off the CHG Soap.                9.  Pat yourself dry with a clean towel.            10.  Wear clean pajamas.            11.  Place clean sheets on your bed the night of your first shower and do  not  sleep with pets. Day of Surgery : Do not apply any CHG, lotions/deodorants the morning of surgery.  Please wear clean clothes to the hospital/surgery center.  FAILURE TO FOLLOW THESE INSTRUCTIONS MAY RESULT IN THE CANCELLATION OF YOUR SURGERY  PATIENT SIGNATURE_________________________________  NURSE SIGNATURE__________________________________  ________________________________________________________________________

## 2024-10-21 ENCOUNTER — Encounter (HOSPITAL_COMMUNITY): Payer: Self-pay

## 2024-10-21 ENCOUNTER — Encounter (HOSPITAL_COMMUNITY)
Admission: RE | Admit: 2024-10-21 | Discharge: 2024-10-21 | Disposition: A | Discharge status: Home / Self Care | Source: Ambulatory Visit | Attending: Surgery | Admitting: Surgery

## 2024-10-21 ENCOUNTER — Other Ambulatory Visit: Payer: Self-pay

## 2024-10-21 VITALS — BP 149/72 | HR 73 | Temp 98.2°F | Resp 16 | Ht 61.0 in | Wt 170.0 lb

## 2024-10-21 DIAGNOSIS — Z86711 Personal history of pulmonary embolism: Secondary | ICD-10-CM | POA: Insufficient documentation

## 2024-10-21 DIAGNOSIS — K449 Diaphragmatic hernia without obstruction or gangrene: Secondary | ICD-10-CM | POA: Diagnosis not present

## 2024-10-21 DIAGNOSIS — I10 Essential (primary) hypertension: Secondary | ICD-10-CM | POA: Diagnosis not present

## 2024-10-21 DIAGNOSIS — I4891 Unspecified atrial fibrillation: Secondary | ICD-10-CM | POA: Insufficient documentation

## 2024-10-21 DIAGNOSIS — Z01812 Encounter for preprocedural laboratory examination: Secondary | ICD-10-CM | POA: Diagnosis not present

## 2024-10-21 DIAGNOSIS — J45909 Unspecified asthma, uncomplicated: Secondary | ICD-10-CM | POA: Diagnosis not present

## 2024-10-21 DIAGNOSIS — Z8711 Personal history of peptic ulcer disease: Secondary | ICD-10-CM | POA: Insufficient documentation

## 2024-10-21 DIAGNOSIS — E119 Type 2 diabetes mellitus without complications: Secondary | ICD-10-CM | POA: Diagnosis not present

## 2024-10-21 DIAGNOSIS — I340 Nonrheumatic mitral (valve) insufficiency: Secondary | ICD-10-CM | POA: Diagnosis not present

## 2024-10-21 DIAGNOSIS — Z7984 Long term (current) use of oral hypoglycemic drugs: Secondary | ICD-10-CM | POA: Diagnosis not present

## 2024-10-21 DIAGNOSIS — Z7901 Long term (current) use of anticoagulants: Secondary | ICD-10-CM | POA: Diagnosis not present

## 2024-10-21 DIAGNOSIS — G4733 Obstructive sleep apnea (adult) (pediatric): Secondary | ICD-10-CM | POA: Diagnosis not present

## 2024-10-21 LAB — BASIC METABOLIC PANEL WITH GFR
Anion gap: 10 (ref 5–15)
BUN: 20 mg/dL (ref 8–23)
CO2: 27 mmol/L (ref 22–32)
Calcium: 10.1 mg/dL (ref 8.9–10.3)
Chloride: 103 mmol/L (ref 98–111)
Creatinine, Ser: 0.94 mg/dL (ref 0.44–1.00)
GFR, Estimated: >60 mL/min (ref >60)
Glucose, Bld: 94 mg/dL (ref 70–99)
Potassium: 4.6 mmol/L (ref 3.5–5.1)
Sodium: 140 mmol/L (ref 135–145)

## 2024-10-21 LAB — HEMOGLOBIN A1C
Hgb A1c MFr Bld: 5.6 % (ref 4.8–5.6)
Mean Plasma Glucose: 114.02 mg/dL

## 2024-10-21 LAB — GLUCOSE, CAPILLARY: Glucose-Capillary: 89 mg/dL (ref 70–99)

## 2024-10-21 LAB — CBC
HCT: 42.5 % (ref 36.0–46.0)
Hemoglobin: 13.2 g/dL (ref 12.0–15.0)
MCH: 27.7 pg (ref 26.0–34.0)
MCHC: 31.1 g/dL (ref 30.0–36.0)
MCV: 89.3 fL (ref 80.0–100.0)
Platelets: 274 K/uL (ref 150–400)
RBC: 4.76 MIL/uL (ref 3.87–5.11)
RDW: 17.3 % — ABNORMAL HIGH (ref 11.5–15.5)
WBC: 8.6 K/uL (ref 4.0–10.5)
nRBC: 0 % (ref 0.0–0.2)

## 2024-10-22 ENCOUNTER — Encounter (HOSPITAL_COMMUNITY): Payer: Self-pay

## 2024-10-22 NOTE — Progress Notes (Signed)
 Case: [id number] Date/Time: 10/28/24 1145   Procedure: REPAIR, HERNIA, HIATAL, ROBOT-ASSISTED - ROBOTIC HIATAL HERNIA REPAIR WITH ABSORBABLE MESH AND POSSIBLE FUNDOPLICATION   Anesthesia type: General   Pre-op diagnosis: HIATAL HERNIA WITH CAMERONS ULCERS   Location: [facility] ROOM 02 / [facility]   Surgeons: Stechschulte, Deward PARAS, MD       DISCUSSION: [name] is a 78 year old female with history of hypertension, A-fib s/p ablation (09/2023) on Xarelto , history of PE (2020), asthma, mild OSA (no CPAP use), GERD, PUD, hiatal hernia, type 2 diabetes, arthritis, anxiety  Prior anesthesia complications include PONV, prolonged emergence, history of difficult airway - last airway note on 10/09/2023 for A.fib ablation notes Glidescope was used.  Patient follows with cardiology for history of A-fib status post ablation in 09/2023.  Additionally she had an ischemic evaluation in June 2025 when she was admitted for chest pain.  She had an abnormal stress test done inpatient which led to a left heart cath which showed no CAD.  Echo on 05/28/2024 showed normal LVEF with mild mitral regurgitation.  Patient had a cardiac clearance televisit on 10/14 with Orren Fabry PA-C.  Patient denied any symptoms.  She was cleared for upcoming surgery:  Preoperative Cardiovascular Risk Assessment:   [name] perioperative risk of a major cardiac event is 0.9% according to the Revised Cardiac Risk Index (RCRI).  Therefore, she is at low risk for perioperative complications.   Her functional capacity is good at 5.07 METs according to the Duke Activity Status Index (DASI). Recommendations: According to ACC/AHA guidelines, no further cardiovascular testing needed.  The patient may proceed to surgery at acceptable risk.   Antiplatelet and/or Anticoagulation Recommendations:   Xarelto  (Rivaroxaban ) can be held for 2 days prior to surgery.  Please resume post op when felt to be safe.  Patient admitted for GI bleed from 8/5 to  8/8.  Hemoglobin was noted to be 6.1 and she was transfused 2 units.  She underwent colonoscopy and EGD which showed large hiatal hernia with Ole ulcers.  Last dose Xarelto :10/25/24 1900   VS: BP (!) 149/72   Pulse 73   Temp 36.8 C (Oral)   Resp 16   Ht 5' 1 (1.549 m)   Wt 77.1 kg   SpO2 96%   BMI 32.12 kg/m   PROVIDERS: Marvine Rush, MD   LABS: Labs reviewed: Acceptable for surgery. (all labs ordered are listed, but only abnormal results are displayed)  Labs Reviewed  CBC - Abnormal; Notable for the following components:      Result Value   RDW 17.3 (*)    All other components within normal limits  HEMOGLOBIN A1C  BASIC METABOLIC PANEL WITH GFR  GLUCOSE, CAPILLARY     Left heart cath 05/30/2024:  Conclusions: No angiographically significant coronary artery disease. Normal left ventricular filling pressure.   Recommendations: Primary prevention of coronary artery disease. If no evidence of bleeding or vascular injury at right radial arteriotomy site, rivaroxaban  can be restarted this evening.  Stress test 05/29/2024:  Narrative & Impression     Findings are consistent with small to moderate distal inferior/inferolateral ischemia. Low to intermediate risk   No ST deviation was noted.   LV perfusion is abnormal.   Left ventricular function is normal. Nuclear stress EF: 81%. The left ventricular ejection fraction is hyperdynamic (>65%). End diastolic cavity size is normal.   Small mild intensity anterior defect most intense in the resting images with normal wall motion consistent with breast attenuation. Echo 05/28/2024:  IMPRESSIONS    1. Left ventricular ejection fraction, by estimation, is 60 to 65%. The left ventricle has normal function. The left ventricle has no regional wall motion abnormalities. Left ventricular diastolic parameters are indeterminate. Elevated left atrial pressure.  2. Right ventricular systolic function is normal. The right  ventricular size is normal. There is normal pulmonary artery systolic pressure.  3. The mitral valve is abnormal. Mild mitral valve regurgitation. No evidence of mitral stenosis.  4. The aortic valve is tricuspid. Aortic valve regurgitation is not visualized. No aortic stenosis is present.  5. The inferior vena cava is normal in size with greater than 50% respiratory variability, suggesting right atrial pressure of 3 mmHg.  Past Medical History:  Diagnosis Date   Anxiety 12/29/2022   Arthritis    Asthma    Carpal tunnel syndrome    Depression    Dyspnea    Dysrhythmia    Gastric ulcer    GERD (gastroesophageal reflux disease)    H/O hiatal hernia    Heart murmur    since birth   History of heart murmur in childhood    History of kidney stones    Hypertension    pt denies   Iron deficiency anemia    Neuropathy    Following neck surgery.   Paralysis (HCC)    PONV (postoperative nausea and vomiting)    in past- pt states caused by Percocet reaction   Primary localized osteoarthritis of right knee 02/06/2024   Pulmonary embolus [facility])    November 2020   Type 2 diabetes mellitus [facility])     Past Surgical History:  Procedure Laterality Date   APPENDECTOMY     ATRIAL FIBRILLATION ABLATION N/A 10/09/2023   Procedure: ATRIAL FIBRILLATION ABLATION;  Surgeon: Kennyth Chew, MD;  Location: [facility];  Service: Cardiovascular;  Laterality: N/A;   BIOPSY  06/01/2022   Procedure: BIOPSY;  Surgeon: Golda Claudis PENNER, MD;  [facility];  Service: Endoscopy;;   CHOLECYSTECTOMY     COLONOSCOPY     COLONOSCOPY N/A 09/26/2013   Procedure: COLONOSCOPY;  Surgeon: Claudis PENNER Golda, MD;  [facility];  Service: Endoscopy;  Laterality: N/A;  1030-rescheduled to 12:00pm Ann notified pt   COLONOSCOPY N/A 03/27/2015   Procedure: COLONOSCOPY;  Surgeon: Claudis PENNER Golda, MD;  [facility];  Service: Endoscopy;  Laterality: N/A;  11:15 - moved to 8:25 - Ann to  notify pt   COLONOSCOPY N/A 08/01/2024   Procedure: COLONOSCOPY;  Surgeon: Cinderella Deatrice FALCON, MD;  [facility];  Service: Endoscopy;  Laterality: N/A;   COLONOSCOPY WITH PROPOFOL  N/A 06/01/2022   Procedure: COLONOSCOPY WITH PROPOFOL ;  Surgeon: Golda Claudis PENNER, MD;  [facility];  Service: Endoscopy;  Laterality: N/A;  1005 ASA 2   CYSTOSCOPY/URETEROSCOPY/HOLMIUM LASER/STENT PLACEMENT Left 12/23/2022   Procedure: CYSTOSCOPY/ LEFT RETROGRADE/ URETEROSCOPY/HOLMIUM LASER/STENT PLACEMENT;  Surgeon: Nieves Cough, MD;  [facility];  Service: Urology;  Laterality: Left;   ESOPHAGOGASTRODUODENOSCOPY  12/08/2011   Procedure: ESOPHAGOGASTRODUODENOSCOPY (EGD);  Surgeon: Claudis PENNER Golda, MD;  [facility];  Service: Endoscopy;  Laterality: N/A;  3:00    ESOPHAGOGASTRODUODENOSCOPY N/A 03/27/2015   Procedure: ESOPHAGOGASTRODUODENOSCOPY (EGD);  Surgeon: Claudis PENNER Golda, MD;  [facility];  Service: Endoscopy;  Laterality: N/A;   ESOPHAGOGASTRODUODENOSCOPY N/A 08/01/2024   Procedure: EGD (ESOPHAGOGASTRODUODENOSCOPY);  Surgeon: Cinderella Deatrice FALCON, MD;  [facility];  Service: Endoscopy;  Laterality: N/A;   EYE SURGERY  Bilateral    cataract surgery   GIVENS CAPSULE STUDY N/A 11/04/2015   Procedure: GIVENS CAPSULE STUDY;  Surgeon: Claudis RAYMOND Rivet, MD;  [facility];  Service: Endoscopy;  Laterality: N/A;   Hysterscopy     KNEE ARTHROSCOPY     Left   LEFT HEART CATH AND CORONARY ANGIOGRAPHY N/A 05/30/2024   Procedure: LEFT HEART CATH AND CORONARY ANGIOGRAPHY;  Surgeon: Mady Bruckner, MD;  Location: [facility];  Service: Cardiovascular;  Laterality: N/A;   NECK SURGERY     x 2    TONSILLECTOMY     TOTAL KNEE ARTHROPLASTY  07/11/2012   Procedure: TOTAL KNEE ARTHROPLASTY;  Surgeon: Toribio JULIANNA Chancy, MD;  Location: [facility] OR;  Service: Orthopedics;  Laterality: Left;  left total knee arthroplasty   TOTAL KNEE ARTHROPLASTY Right 02/06/2024    Procedure: TOTAL KNEE ARTHROPLASTY;  Surgeon: Josefina Chew, MD;  [facility];  Service: Orthopedics;  Laterality: Right;   UPPER GASTROINTESTINAL ENDOSCOPY     YAG LASER APPLICATION Right 02/21/2017   Procedure: YAG LASER APPLICATION;  Surgeon: Oneil Platts, MD;  [facility];  Service: Ophthalmology;  Laterality: Right;    MEDICATIONS:  acetaminophen  (TYLENOL ) 500 MG tablet   cholecalciferol  (VITAMIN D3) 25 MCG (1000 UNIT) tablet   Cranberry-Vitamin C (AZO CRANBERRY URINARY TRACT PO)   cyanocobalamin (VITAMIN B12) 1000 MCG tablet   escitalopram  (LEXAPRO ) 20 MG tablet   ferrous sulfate  325 (65 FE) MG tablet   furosemide  (LASIX ) 20 MG tablet   gabapentin  (NEURONTIN ) 300 MG capsule   KLOR-CON  M20 20 MEQ tablet   metFORMIN (GLUCOPHAGE) 500 MG tablet   montelukast  (SINGULAIR ) 10 MG tablet   omeprazole  (PRILOSEC) 20 MG capsule   Probiotic Product (PROBIOTIC DAILY PO)   rivaroxaban  (XARELTO ) 20 MG TABS tablet   Turmeric 450 MG CAPS   zolpidem  (AMBIEN ) 10 MG tablet   No current facility-administered medications for this encounter.   Burnard CHRISTELLA Odis DEVONNA [facility] Short Stay/Anesthesiology [facility] Phone [phone] 10/22/2024 1:13 PM

## 2024-10-22 NOTE — Anesthesia Preprocedure Evaluation (Addendum)
 Anesthesia Evaluation  Patient identified by MRN, date of birth, ID band Patient awake    Reviewed: Allergy & Precautions, NPO status , Patient's Chart, lab work & pertinent test results  History of Anesthesia Complications (+) PONV, DIFFICULT AIRWAY and history of anesthetic complications (Glidescope used successfully)  Airway Mallampati: III  TM Distance: <3 FB Neck ROM: Full    Dental no notable dental hx.    Pulmonary asthma , sleep apnea , PE (2020)   Pulmonary exam normal        Cardiovascular hypertension, Normal cardiovascular exam+ dysrhythmias (s/p ablation 2024) Atrial Fibrillation   TTE 05/2024: EF 60-65%, mild MR     Neuro/Psych   Anxiety Depression    negative neurological ROS     GI/Hepatic Neg liver ROS, hiatal hernia,GERD  Medicated and Poorly Controlled,,  Endo/Other  diabetes, Type 2, Oral Hypoglycemic Agents    Renal/GU      Musculoskeletal  (+) Arthritis ,    Abdominal   Peds  Hematology negative hematology ROS (+)   Anesthesia Other Findings   Reproductive/Obstetrics                              Anesthesia Physical Anesthesia Plan  ASA: 2  Anesthesia Plan: General   Post-op Pain Management: Tylenol  PO (pre-op)*   Induction: Intravenous and Rapid sequence  PONV Risk Score and Plan: 4 or greater and Treatment may vary due to age or medical condition, Dexamethasone , Ondansetron , Propofol  infusion and TIVA  Airway Management Planned: Oral ETT and Video Laryngoscope Planned  Additional Equipment: None  Intra-op Plan:   Post-operative Plan: Extubation in OR  Informed Consent: I have reviewed the patients History and Physical, chart, labs and discussed the procedure including the risks, benefits and alternatives for the proposed anesthesia with the patient or authorized representative who has indicated his/her understanding and acceptance.     Dental  advisory given  Plan Discussed with: CRNA  Anesthesia Plan Comments: (See PAT note from 10/27)         Anesthesia Quick Evaluation

## 2024-10-28 ENCOUNTER — Encounter (HOSPITAL_COMMUNITY): Admission: RE | Disposition: A | Payer: Self-pay | Source: Ambulatory Visit | Attending: Surgery

## 2024-10-28 ENCOUNTER — Ambulatory Visit (HOSPITAL_COMMUNITY)
Admission: RE | Admit: 2024-10-28 | Discharge: 2024-10-29 | Disposition: A | Source: Ambulatory Visit | Attending: Surgery | Admitting: Surgery

## 2024-10-28 ENCOUNTER — Ambulatory Visit (HOSPITAL_COMMUNITY): Admitting: Anesthesiology

## 2024-10-28 ENCOUNTER — Ambulatory Visit (HOSPITAL_COMMUNITY): Payer: Self-pay | Admitting: Medical

## 2024-10-28 ENCOUNTER — Other Ambulatory Visit: Payer: Self-pay

## 2024-10-28 ENCOUNTER — Encounter (HOSPITAL_COMMUNITY): Payer: Self-pay | Admitting: Surgery

## 2024-10-28 DIAGNOSIS — K219 Gastro-esophageal reflux disease without esophagitis: Secondary | ICD-10-CM | POA: Insufficient documentation

## 2024-10-28 DIAGNOSIS — I110 Hypertensive heart disease with heart failure: Secondary | ICD-10-CM | POA: Insufficient documentation

## 2024-10-28 DIAGNOSIS — J45909 Unspecified asthma, uncomplicated: Secondary | ICD-10-CM | POA: Insufficient documentation

## 2024-10-28 DIAGNOSIS — Z7984 Long term (current) use of oral hypoglycemic drugs: Secondary | ICD-10-CM | POA: Diagnosis not present

## 2024-10-28 DIAGNOSIS — I4819 Other persistent atrial fibrillation: Secondary | ICD-10-CM | POA: Diagnosis not present

## 2024-10-28 DIAGNOSIS — F418 Other specified anxiety disorders: Secondary | ICD-10-CM | POA: Diagnosis not present

## 2024-10-28 DIAGNOSIS — D649 Anemia, unspecified: Secondary | ICD-10-CM | POA: Insufficient documentation

## 2024-10-28 DIAGNOSIS — G4733 Obstructive sleep apnea (adult) (pediatric): Secondary | ICD-10-CM | POA: Diagnosis not present

## 2024-10-28 DIAGNOSIS — K449 Diaphragmatic hernia without obstruction or gangrene: Secondary | ICD-10-CM | POA: Insufficient documentation

## 2024-10-28 DIAGNOSIS — Z79899 Other long term (current) drug therapy: Secondary | ICD-10-CM | POA: Diagnosis not present

## 2024-10-28 DIAGNOSIS — M1990 Unspecified osteoarthritis, unspecified site: Secondary | ICD-10-CM | POA: Insufficient documentation

## 2024-10-28 DIAGNOSIS — E1140 Type 2 diabetes mellitus with diabetic neuropathy, unspecified: Secondary | ICD-10-CM | POA: Diagnosis not present

## 2024-10-28 DIAGNOSIS — E119 Type 2 diabetes mellitus without complications: Secondary | ICD-10-CM

## 2024-10-28 DIAGNOSIS — Z8249 Family history of ischemic heart disease and other diseases of the circulatory system: Secondary | ICD-10-CM | POA: Insufficient documentation

## 2024-10-28 DIAGNOSIS — K253 Acute gastric ulcer without hemorrhage or perforation: Secondary | ICD-10-CM | POA: Diagnosis not present

## 2024-10-28 DIAGNOSIS — Z7901 Long term (current) use of anticoagulants: Secondary | ICD-10-CM | POA: Diagnosis not present

## 2024-10-28 DIAGNOSIS — I5032 Chronic diastolic (congestive) heart failure: Secondary | ICD-10-CM | POA: Diagnosis not present

## 2024-10-28 HISTORY — PX: XI ROBOTIC ASSISTED HIATAL HERNIA REPAIR: SHX6889

## 2024-10-28 LAB — CBC
HCT: 42.9 % (ref 36.0–46.0)
Hemoglobin: 12.8 g/dL (ref 12.0–15.0)
MCH: 26.8 pg (ref 26.0–34.0)
MCHC: 29.8 g/dL — ABNORMAL LOW (ref 30.0–36.0)
MCV: 89.9 fL (ref 80.0–100.0)
Platelets: 249 K/uL (ref 150–400)
RBC: 4.77 MIL/uL (ref 3.87–5.11)
RDW: 17 % — ABNORMAL HIGH (ref 11.5–15.5)
WBC: 12.4 K/uL — ABNORMAL HIGH (ref 4.0–10.5)
nRBC: 0 % (ref 0.0–0.2)

## 2024-10-28 LAB — GLUCOSE, CAPILLARY
Glucose-Capillary: 114 mg/dL — ABNORMAL HIGH (ref 70–99)
Glucose-Capillary: 216 mg/dL — ABNORMAL HIGH (ref 70–99)
Glucose-Capillary: 189 mg/dL — ABNORMAL HIGH (ref 70–99)
Glucose-Capillary: 121 mg/dL — ABNORMAL HIGH (ref 70–99)

## 2024-10-28 LAB — CREATININE, SERUM
Creatinine, Ser: 0.88 mg/dL (ref 0.44–1.00)
GFR, Estimated: >60 mL/min (ref >60)

## 2024-10-28 SURGERY — REPAIR, HERNIA, HIATAL, ROBOT-ASSISTED
Anesthesia: General

## 2024-10-28 MED ORDER — DOCUSATE SODIUM 100 MG PO CAPS
100.0000 mg | ORAL_CAPSULE | Freq: Two times a day (BID) | ORAL | Status: DC
  Administered 2024-10-28 – 2024-10-29 (×2): 100 mg via ORAL
  Filled 2024-10-28 (×2): qty 1

## 2024-10-28 MED ORDER — ONDANSETRON HCL 4 MG/2ML IJ SOLN
INTRAMUSCULAR | Status: DC | PRN
  Administered 2024-10-28: 4 mg via INTRAVENOUS

## 2024-10-28 MED ORDER — PROPOFOL 10 MG/ML IV BOLUS
INTRAVENOUS | Status: DC | PRN
  Administered 2024-10-28: 120 mg via INTRAVENOUS
  Administered 2024-10-28: 20 mg via INTRAVENOUS
  Administered 2024-10-28: 50 ug/kg/min via INTRAVENOUS

## 2024-10-28 MED ORDER — HEPARIN SODIUM (PORCINE) 5000 UNIT/ML IJ SOLN
5000.0000 [IU] | Freq: Once | INTRAMUSCULAR | Status: AC
  Administered 2024-10-28: 5000 [IU] via SUBCUTANEOUS
  Filled 2024-10-28: qty 1

## 2024-10-28 MED ORDER — MIDAZOLAM HCL (PF) 2 MG/2ML IJ SOLN: 1.0000 mg | INTRAMUSCULAR | Status: DC

## 2024-10-28 MED ORDER — PROCHLORPERAZINE EDISYLATE 10 MG/2ML IJ SOLN: 10.0000 mg | INTRAMUSCULAR | Status: DC | PRN

## 2024-10-28 MED ORDER — SUGAMMADEX SODIUM 200 MG/2ML IV SOLN
INTRAVENOUS | Status: AC
  Filled 2024-10-28: qty 2

## 2024-10-28 MED ORDER — CEFAZOLIN SODIUM-DEXTROSE 2-3 GM-%(50ML) IV SOLR
INTRAVENOUS | Status: DC | PRN
  Administered 2024-10-28: 2 g via INTRAVENOUS

## 2024-10-28 MED ORDER — LIDOCAINE HCL 2 % IJ SOLN
INTRAMUSCULAR | Status: AC
Start: 2024-10-28 — End: 2024-10-28
  Filled 2024-10-28: qty 20

## 2024-10-28 MED ORDER — LACTATED RINGERS IV SOLN: INTRAVENOUS | Status: DC

## 2024-10-28 MED ORDER — 0.9 % SODIUM CHLORIDE (POUR BTL) OPTIME
TOPICAL | Status: DC | PRN
  Administered 2024-10-28: 1000 mL

## 2024-10-28 MED ORDER — METHOCARBAMOL 1000 MG/10ML IJ SOLN: 500.0000 mg | Freq: Four times a day (QID) | INTRAMUSCULAR | Status: DC | PRN

## 2024-10-28 MED ORDER — ROCURONIUM BROMIDE 10 MG/ML (PF) SYRINGE
PREFILLED_SYRINGE | INTRAVENOUS | Status: AC
  Filled 2024-10-28: qty 10

## 2024-10-28 MED ORDER — ROCURONIUM BROMIDE 100 MG/10ML IV SOLN
INTRAVENOUS | Status: DC | PRN
  Administered 2024-10-28: 20 mg via INTRAVENOUS
  Administered 2024-10-28: 60 mg via INTRAVENOUS

## 2024-10-28 MED ORDER — CEFAZOLIN SODIUM-DEXTROSE 2-4 GM/100ML-% IV SOLN
INTRAVENOUS | Status: AC
  Filled 2024-10-28: qty 100

## 2024-10-28 MED ORDER — MONTELUKAST SODIUM 10 MG PO TABS
10.0000 mg | ORAL_TABLET | Freq: Every morning | ORAL | Status: DC
  Administered 2024-10-29: 10 mg via ORAL
  Filled 2024-10-28: qty 1

## 2024-10-28 MED ORDER — FENTANYL CITRATE (PF) 100 MCG/2ML IJ SOLN
INTRAMUSCULAR | Status: DC | PRN
  Administered 2024-10-28 (×2): 50 ug via INTRAVENOUS

## 2024-10-28 MED ORDER — FENTANYL CITRATE (PF) 50 MCG/ML IJ SOSY: 25.0000 ug | PREFILLED_SYRINGE | INTRAMUSCULAR | Status: DC | PRN

## 2024-10-28 MED ORDER — HYDROMORPHONE HCL 1 MG/ML IJ SOLN
INTRAMUSCULAR | Status: DC | PRN
  Administered 2024-10-28 (×2): .5 mg via INTRAVENOUS

## 2024-10-28 MED ORDER — FENTANYL CITRATE (PF) 100 MCG/2ML IJ SOLN
INTRAMUSCULAR | Status: AC
  Filled 2024-10-28: qty 2

## 2024-10-28 MED ORDER — BUPIVACAINE-EPINEPHRINE 0.25% -1:200000 IJ SOLN
INTRAMUSCULAR | Status: DC | PRN
  Administered 2024-10-28: 60 mL

## 2024-10-28 MED ORDER — BUPIVACAINE-EPINEPHRINE (PF) 0.25% -1:200000 IJ SOLN
INTRAMUSCULAR | Status: AC
  Filled 2024-10-28: qty 60

## 2024-10-28 MED ORDER — INSULIN ASPART 100 UNIT/ML IJ SOLN
0.0000 [IU] | Freq: Three times a day (TID) | INTRAMUSCULAR | Status: DC
  Administered 2024-10-28: 3 [IU] via SUBCUTANEOUS

## 2024-10-28 MED ORDER — ORAL CARE MOUTH RINSE: 15.0000 mL | Freq: Once | OROMUCOSAL | Status: AC

## 2024-10-28 MED ORDER — PHENYLEPHRINE HCL (PRESSORS) 10 MG/ML IV SOLN
INTRAVENOUS | Status: DC | PRN
  Administered 2024-10-28: 160 ug via INTRAVENOUS
  Administered 2024-10-28 (×2): 80 ug via INTRAVENOUS

## 2024-10-28 MED ORDER — EPHEDRINE SULFATE (PRESSORS) 25 MG/5ML IV SOSY
PREFILLED_SYRINGE | INTRAVENOUS | Status: DC | PRN
  Administered 2024-10-28 (×2): 5 mg via INTRAVENOUS

## 2024-10-28 MED ORDER — CHLORHEXIDINE GLUCONATE CLOTH 2 % EX PADS: 6.0000 | MEDICATED_PAD | Freq: Once | CUTANEOUS | Status: DC

## 2024-10-28 MED ORDER — GABAPENTIN 100 MG PO CAPS
300.0000 mg | ORAL_CAPSULE | Freq: Every day | ORAL | Status: DC
  Administered 2024-10-28: 300 mg via ORAL
  Filled 2024-10-28: qty 3

## 2024-10-28 MED ORDER — OXYCODONE HCL 5 MG PO TABS
5.0000 mg | ORAL_TABLET | ORAL | Status: DC | PRN
  Administered 2024-10-29: 5 mg via ORAL
  Filled 2024-10-28: qty 1

## 2024-10-28 MED ORDER — INSULIN ASPART 100 UNIT/ML IJ SOLN: 0.0000 [IU] | Freq: Every day | INTRAMUSCULAR | Status: DC

## 2024-10-28 MED ORDER — SIMETHICONE 80 MG PO CHEW
80.0000 mg | CHEWABLE_TABLET | Freq: Four times a day (QID) | ORAL | Status: DC | PRN
  Administered 2024-10-28 – 2024-10-29 (×3): 80 mg via ORAL
  Filled 2024-10-28 (×3): qty 1

## 2024-10-28 MED ORDER — ACETAMINOPHEN 500 MG PO TABS
1000.0000 mg | ORAL_TABLET | ORAL | Status: AC
  Administered 2024-10-28: 1000 mg via ORAL
  Filled 2024-10-28: qty 2

## 2024-10-28 MED ORDER — SUGAMMADEX SODIUM 200 MG/2ML IV SOLN
INTRAVENOUS | Status: DC | PRN
  Administered 2024-10-28: 200 mg via INTRAVENOUS

## 2024-10-28 MED ORDER — ENOXAPARIN SODIUM 40 MG/0.4ML IJ SOSY
40.0000 mg | PREFILLED_SYRINGE | INTRAMUSCULAR | Status: DC
  Administered 2024-10-29: 40 mg via SUBCUTANEOUS
  Filled 2024-10-28: qty 0.4

## 2024-10-28 MED ORDER — ONDANSETRON HCL 4 MG/2ML IJ SOLN: 4.0000 mg | Freq: Four times a day (QID) | INTRAMUSCULAR | Status: DC | PRN

## 2024-10-28 MED ORDER — LACTATED RINGERS IV SOLN
INTRAVENOUS | Status: DC
Start: 2024-10-28 — End: 2024-10-29

## 2024-10-28 MED ORDER — KETAMINE HCL 50 MG/5ML IJ SOSY
PREFILLED_SYRINGE | INTRAMUSCULAR | Status: AC
Start: 2024-10-28 — End: 2024-10-28
  Filled 2024-10-28: qty 5

## 2024-10-28 MED ORDER — ACETAMINOPHEN 325 MG PO TABS
650.0000 mg | ORAL_TABLET | Freq: Four times a day (QID) | ORAL | Status: DC
Start: 2024-10-28 — End: 2024-10-29
  Administered 2024-10-28 – 2024-10-29 (×2): 650 mg via ORAL
  Filled 2024-10-28 (×4): qty 2

## 2024-10-28 MED ORDER — HYDROMORPHONE HCL 1 MG/ML IJ SOLN
0.5000 mg | INTRAMUSCULAR | Status: DC | PRN
Start: 2024-10-28 — End: 2024-10-29

## 2024-10-28 MED ORDER — ZOLPIDEM TARTRATE 5 MG PO TABS: 5.0000 mg | ORAL_TABLET | Freq: Every evening | ORAL | Status: DC | PRN

## 2024-10-28 MED ORDER — PHENYLEPHRINE HCL-NACL 20-0.9 MG/250ML-% IV SOLN
INTRAVENOUS | Status: DC | PRN
  Administered 2024-10-28: 20 ug/min via INTRAVENOUS

## 2024-10-28 MED ORDER — PROPOFOL 1000 MG/100ML IV EMUL
INTRAVENOUS | Status: AC
  Filled 2024-10-28: qty 100

## 2024-10-28 MED ORDER — ONDANSETRON HCL 4 MG/2ML IJ SOLN
INTRAMUSCULAR | Status: AC
  Filled 2024-10-28: qty 2

## 2024-10-28 MED ORDER — LIDOCAINE HCL (CARDIAC) PF 100 MG/5ML IV SOSY
PREFILLED_SYRINGE | INTRAVENOUS | Status: DC | PRN
  Administered 2024-10-28: 100 mg via INTRAVENOUS

## 2024-10-28 MED ORDER — INSULIN ASPART 100 UNIT/ML IJ SOLN: 0.0000 [IU] | INTRAMUSCULAR | Status: DC | PRN

## 2024-10-28 MED ORDER — DEXAMETHASONE SODIUM PHOSPHATE 4 MG/ML IJ SOLN
INTRAMUSCULAR | Status: DC | PRN
  Administered 2024-10-28: 4 mg via INTRAVENOUS

## 2024-10-28 MED ORDER — HYDROMORPHONE HCL 2 MG/ML IJ SOLN
INTRAMUSCULAR | Status: AC
  Filled 2024-10-28: qty 1

## 2024-10-28 MED ORDER — INSULIN ASPART 100 UNIT/ML IJ SOLN
INTRAMUSCULAR | Status: AC
  Filled 2024-10-28: qty 1

## 2024-10-28 MED ORDER — ESCITALOPRAM OXALATE 20 MG PO TABS
20.0000 mg | ORAL_TABLET | Freq: Every day | ORAL | Status: DC
  Administered 2024-10-29: 20 mg via ORAL
  Filled 2024-10-28: qty 1

## 2024-10-28 MED ORDER — INSULIN ASPART 100 UNIT/ML IJ SOLN
2.0000 [IU] | Freq: Once | INTRAMUSCULAR | Status: AC
  Administered 2024-10-28: 2 [IU] via SUBCUTANEOUS

## 2024-10-28 MED ORDER — OXYCODONE HCL 5 MG PO TABS
10.0000 mg | ORAL_TABLET | ORAL | Status: DC | PRN
  Administered 2024-10-28: 10 mg via ORAL
  Filled 2024-10-28: qty 2

## 2024-10-28 MED ORDER — CHLORHEXIDINE GLUCONATE 0.12 % MT SOLN
15.0000 mL | Freq: Once | OROMUCOSAL | Status: AC
  Administered 2024-10-28: 15 mL via OROMUCOSAL

## 2024-10-28 MED ORDER — PANTOPRAZOLE SODIUM 40 MG PO TBEC
40.0000 mg | DELAYED_RELEASE_TABLET | Freq: Every day | ORAL | Status: DC
  Administered 2024-10-29: 40 mg via ORAL
  Filled 2024-10-28: qty 1

## 2024-10-28 MED ORDER — FENTANYL CITRATE (PF) 50 MCG/ML IJ SOSY: 50.0000 ug | PREFILLED_SYRINGE | INTRAMUSCULAR | Status: DC

## 2024-10-28 MED ORDER — DROPERIDOL 2.5 MG/ML IJ SOLN: 0.6250 mg | Freq: Once | INTRAMUSCULAR | Status: DC | PRN

## 2024-10-28 SURGICAL SUPPLY — 56 items
BAG COUNTER SPONGE SURGICOUNT (BAG) ×1 IMPLANT
BLADE SURG SZ11 CARB STEEL (BLADE) ×1 IMPLANT
CHLORAPREP W/TINT 26 (MISCELLANEOUS) ×1 IMPLANT
CLIP APPLIE 5 13 M/L LIGAMAX5 (MISCELLANEOUS) IMPLANT
CLIP APPLIE ROT 10 11.4 M/L (STAPLE) IMPLANT
COVER SURGICAL LIGHT HANDLE (MISCELLANEOUS) ×1 IMPLANT
COVER TIP SHEARS 8 DVNC (MISCELLANEOUS) IMPLANT
DERMABOND ADVANCED .7 DNX12 (GAUZE/BANDAGES/DRESSINGS) ×1 IMPLANT
DRAIN PENROSE 0.5X18 (DRAIN) ×1 IMPLANT
DRAPE ARM DVNC X/XI (DISPOSABLE) ×4 IMPLANT
DRAPE COLUMN DVNC XI (DISPOSABLE) ×1 IMPLANT
DRIVER NDL LRG 8 DVNC XI (INSTRUMENTS) ×1 IMPLANT
DRIVER NDL MEGA SUTCUT DVNCXI (INSTRUMENTS) ×1 IMPLANT
DRIVER NDLE LRG 8 DVNC XI (INSTRUMENTS) ×1 IMPLANT
DRIVER NDLE MEGA SUTCUT DVNCXI (INSTRUMENTS) ×1 IMPLANT
ELECT REM PT RETURN 15FT ADLT (MISCELLANEOUS) ×1 IMPLANT
ENDOLOOP SUT PDS II 0 18 (SUTURE) IMPLANT
GAUZE 4X4 16PLY ~~LOC~~+RFID DBL (SPONGE) ×1 IMPLANT
GLOVE BIO SURGEON STRL SZ7.5 (GLOVE) ×3 IMPLANT
GLOVE INDICATOR 8.0 STRL GRN (GLOVE) ×3 IMPLANT
GOWN STRL REUS W/ TWL XL LVL3 (GOWN DISPOSABLE) ×2 IMPLANT
GRASPER SUT TROCAR 14GX15 (MISCELLANEOUS) ×1 IMPLANT
GRASPER TIP-UP FEN DVNC XI (INSTRUMENTS) ×1 IMPLANT
IRRIGATION SUCT STRKRFLW 2 WTP (MISCELLANEOUS) IMPLANT
KIT BASIN OR (CUSTOM PROCEDURE TRAY) ×1 IMPLANT
KIT TURNOVER KIT A (KITS) ×1 IMPLANT
LUBRICANT JELLY K Y 4OZ (MISCELLANEOUS) IMPLANT
MARKER SKIN DUAL TIP RULER LAB (MISCELLANEOUS) ×1 IMPLANT
MESH BIO-A 7X10 SYN MAT (Mesh General) IMPLANT
NDL HYPO 22X1.5 SAFETY MO (MISCELLANEOUS) ×1 IMPLANT
NEEDLE HYPO 22X1.5 SAFETY MO (MISCELLANEOUS) ×1 IMPLANT
PACK CARDIOVASCULAR III (CUSTOM PROCEDURE TRAY) ×1 IMPLANT
PAD POSITIONING PINK XL (MISCELLANEOUS) ×1 IMPLANT
POUCH RETRIEVAL ECOSAC 10 (ENDOMECHANICALS) IMPLANT
RETRACTOR GRSP SML 8 DVNC XI (INSTRUMENTS) ×1 IMPLANT
SCISSORS LAP 5X35 DISP (ENDOMECHANICALS) IMPLANT
SCISSORS MNPLR CVD DVNC XI (INSTRUMENTS) ×1 IMPLANT
SEAL UNIV 5-12 XI (MISCELLANEOUS) ×3 IMPLANT
SEALER VESSEL EXT DVNC XI (MISCELLANEOUS) ×1 IMPLANT
SET TUBE SMOKE EVAC HIGH FLOW (TUBING) ×1 IMPLANT
SOLUTION ANTFG W/FOAM PAD STRL (MISCELLANEOUS) ×1 IMPLANT
SOLUTION ELECTROSURG ANTI STCK (MISCELLANEOUS) ×1 IMPLANT
SPIKE FLUID TRANSFER (MISCELLANEOUS) ×1 IMPLANT
SUT ETHIBOND 0 36 GRN (SUTURE) ×2 IMPLANT
SUT MNCRL AB 4-0 PS2 18 (SUTURE) ×1 IMPLANT
SUT SILK 0 SH 30 (SUTURE) IMPLANT
SUT SILK 2 0 SH (SUTURE) IMPLANT
SYR 20ML LL LF (SYRINGE) ×1 IMPLANT
SYSTEM BAG RETRIEVAL 10MM (BASKET) IMPLANT
TIP INNERVISION DETACH 40FR (MISCELLANEOUS) IMPLANT
TIP INNERVISION DETACH 50FR (MISCELLANEOUS) IMPLANT
TIP INNERVISION DETACH 56FR (MISCELLANEOUS) IMPLANT
TOWEL OR 17X26 10 PK STRL BLUE (TOWEL DISPOSABLE) ×1 IMPLANT
TRAY FOLEY MTR SLVR 16FR STAT (SET/KITS/TRAYS/PACK) IMPLANT
TROCAR ADV FIXATION 5X100MM (TROCAR) IMPLANT
TROCAR BALLN 12MMX100 BLUNT (TROCAR) IMPLANT

## 2024-10-28 NOTE — Op Note (Signed)
 Patient: [name] (13-Oct-1946, [id number])  Date of Surgery: 10/28/2024  Preoperative Diagnosis: HIATAL HERNIA WITH CAMERONS ULCERS   Postoperative Diagnosis: HIATAL HERNIA WITH CAMERONS ULCERS   Surgical Procedure: Robotic hiatal hernia repair with bio-a mesh placement   Operative Team Members:  Surgeons and Role:    * Sherryn Pollino, Deward PARAS, MD - Primary   Anesthesiologist: Yeraldy Spike Lamarr BRAVO, MD CRNA: Gladis Honey, CRNA; Petranker, Cena RAMAN, CRNA   Anesthesia: General   Fluids:  Total I/O In: 1050 [I.V.:1000; IV Piggyback:50] Out: 200 [Urine:150; Blood:50]  Complications: * No complications entered in OR log *  Drains:  none   Specimen: * No specimens in log *   Disposition:  PACU - hemodynamically stable.  Plan of Care: Admit for overnight observation  Indications for Procedure: [name] is a 78 y.o. female who presented with a hiatal hernia and Cameron's erosions.  She had severe anemia from the Cameron's erosions.  She had previous dysphagia and acid reflux issues.  I recommended robotic hiatal hernia repair.  We discussed procedure itself as well as its risk, benefits, and alternatives.  We discussed the possibility of placing bioabsorbable mesh.  We discussed the possibility of fundoplication.  The risk discussed included but not limited to the risk of infection, bleeding, damage nearby structures, esophageal injury, gastric injury, or recurrent hernia.  After full discussion all questions answered the patient granted consent to proceed.  Findings: Massive hiatal hernia  Description of Procedure:   The patient was positioned supine, padded and secured to the bed.  The abdomen was widely prepped and draped.  A time out procedure was performed.   The abdomen was inflated using a Veress needle at the umbilicus grasped the umbilicus with a Kocher to elevate it.  The abdomen is inflated 15 mmHg.  Four 8 mm robotic trocars were placed across the abdomen.  There was  no trauma the underlying viscera with initial trocar placement.  A Nathanson liver retractor was placed at the epigastrium and utilized to retract the left lobe of the liver anteriorly.  It was held in position with a laparoscopic holding device clamped to the side of the bed.  The robot was docked in the standard fashion and the operation begun from the robotic console.  The vessel sealer was used for dissection.    Beginning at the twelve o'clock position on the crus, the hernia sac was grasped and reduced form the mediastinum.  The hernia sac was divided to enter the plane between the sac and the mediastinum.  The hernia sac was divided towards the left and right with continued traction on the hernia sac to reduce it.  A mediastinal dissection was performed to further reduce the hernia sac which facilitated reduction of the stomach as well.  The hernia sac was disconnected from the right and left crura and subsequently, the hernia sac was dissected from the stomach and esophagus, and excised.  The short gastric vessels were divided from the inferior pole of the spleen, superiorly, to completely disconnect the stomach from the spleen and the left hemidiaphragm.  All posterior gastric attachments to the lesser sac and retroperitoneum were divided.  The left crus was further delineated.  A retroesophageal window was created and a the tip up grasper was used for retraction..    A high, circumferential mediastinal dissection was performed in an effort to mobilize the esophagus and provide for adequate intraabdominal esophageal length.  The mediastinal dissection was performed bluntly, with little  to no thermal energy.  The anterior and posterior vagus nerves were both identified and preserved.  The crural defect was reapproximated with multiple, interrupted, 0 Ethibond sutures.  The crural pillars came together well without tearing of the adjacent diaphragmatic tissue.  Due to the size of the defect, one  purse string suture was placed after posterior closure of the diaphragmatic crura to approximate the lateral and anterior hiatus to the esophagus.  Three 0 ethibond sutures were placed through the musculature of the esophagus to the crus in the 9, 12, and 3 o'clock positions.  A piece of BIO-A mesh was placed to cover the crural closure.  It was fixed to the right and the left of the crus using two 0-ethibond sutures.  The operative field was inspected for hemostasis.  The Penrose drain was removed.  The robot was undocked and the laparoscope was inserted into the peritoneal cavity to visualize the removal of the liver retractor.    Local was injected.  The peritoneal cavity was desufflated and the trocars removed.  The incisions were closed with 4-0 Monocryl subcuticular sutures and skin glue.     [name], MD General, Bariatric, & Minimally Invasive Surgery [facility], [place]

## 2024-10-28 NOTE — Plan of Care (Signed)
   Problem: Health Behavior/Discharge Planning: Goal: Ability to manage health-related needs will improve Outcome: Progressing

## 2024-10-28 NOTE — Anesthesia Postprocedure Evaluation (Signed)
 Anesthesia Post Note  Patient: [name]  Procedure(s) Performed: REPAIR, HERNIA, HIATAL, ROBOT-ASSISTED     Patient location during evaluation: PACU Anesthesia Type: General Level of consciousness: awake and alert Pain management: pain level controlled Vital Signs Assessment: post-procedure vital signs reviewed and stable Respiratory status: spontaneous breathing, nonlabored ventilation and respiratory function stable Cardiovascular status: blood pressure returned to baseline Postop Assessment: no apparent nausea or vomiting Anesthetic complications: no   No notable events documented.  Last Vitals:  Vitals:   10/28/24 1615 10/28/24 1625  BP: (!) 153/64   Pulse: (!) 103 (!) 106  Resp: 13 17  Temp:  (!) 35.8 C  SpO2: 94% 94%    Last Pain:  Vitals:   10/28/24 1600  TempSrc:   PainSc: 0-No pain                 [name]

## 2024-10-28 NOTE — H&P (Signed)
 Admitting Physician: Deward PARAS Mi Balla  Service: General Surgery  CC: Hiatal hernia  Subjective   HPI: [name] is an 78 y.o. female who is here for hiatal hernia repair  Past Medical History:  Diagnosis Date   Anxiety 12/29/2022   Arthritis    Asthma    Carpal tunnel syndrome    Depression    Dyspnea    Dysrhythmia    Gastric ulcer    GERD (gastroesophageal reflux disease)    H/O hiatal hernia    History of heart murmur in childhood    History of kidney stones    Hypertension    pt denies   Iron deficiency anemia    Neuropathy    Following neck surgery.   Paralysis (HCC)    PONV (postoperative nausea and vomiting)    in past- pt states caused by Percocet reaction   Primary localized osteoarthritis of right knee 02/06/2024   Pulmonary embolus [facility])    November 2020   Type 2 diabetes mellitus [facility])     Past Surgical History:  Procedure Laterality Date   APPENDECTOMY     ATRIAL FIBRILLATION ABLATION N/A 10/09/2023   Procedure: ATRIAL FIBRILLATION ABLATION;  Surgeon: Kennyth Chew, MD;  Location: [facility] INVASIVE CV LAB;  Service: Cardiovascular;  Laterality: N/A;   BIOPSY  06/01/2022   Procedure: BIOPSY;  Surgeon: Golda Claudis PENNER, MD;  [facility];  Service: Endoscopy;;   CHOLECYSTECTOMY     COLONOSCOPY     COLONOSCOPY N/A 09/26/2013   Procedure: COLONOSCOPY;  Surgeon: Claudis PENNER Golda, MD;  [facility]: [facility];  Service: Endoscopy;  Laterality: N/A;  1030-rescheduled to 12:00pm Ann notified pt   COLONOSCOPY N/A 03/27/2015   Procedure: COLONOSCOPY;  Surgeon: Claudis PENNER Golda, MD;  [facility]: [facility];  Service: Endoscopy;  Laterality: N/A;  11:15 - moved to 8:25 - Ann to notify pt   COLONOSCOPY N/A 08/01/2024   Procedure: COLONOSCOPY;  Surgeon: Cinderella Deatrice FALCON, MD;  [facility];  Service: Endoscopy;  Laterality: N/A;   COLONOSCOPY WITH PROPOFOL  N/A 06/01/2022   Procedure: COLONOSCOPY WITH PROPOFOL ;  Surgeon: Golda Claudis PENNER, MD;   [facility];  Service: Endoscopy;  Laterality: N/A;  1005 ASA 2   CYSTOSCOPY/URETEROSCOPY/HOLMIUM LASER/STENT PLACEMENT Left 12/23/2022   Procedure: CYSTOSCOPY/ LEFT RETROGRADE/ URETEROSCOPY/HOLMIUM LASER/STENT PLACEMENT;  Surgeon: Nieves Cough, MD;  Location: [facility] ORS;  Service: Urology;  Laterality: Left;   ESOPHAGOGASTRODUODENOSCOPY  12/08/2011   Procedure: ESOPHAGOGASTRODUODENOSCOPY (EGD);  Surgeon: Claudis PENNER Golda, MD;  [facility];  Service: Endoscopy;  Laterality: N/A;  3:00    ESOPHAGOGASTRODUODENOSCOPY N/A 03/27/2015   Procedure: ESOPHAGOGASTRODUODENOSCOPY (EGD);  Surgeon: Claudis PENNER Golda, MD;  [facility];  Service: Endoscopy;  Laterality: N/A;   ESOPHAGOGASTRODUODENOSCOPY N/A 08/01/2024   Procedure: EGD (ESOPHAGOGASTRODUODENOSCOPY);  Surgeon: Cinderella Deatrice FALCON, MD;  [facility];  Service: Endoscopy;  Laterality: N/A;   EYE SURGERY Bilateral    cataract surgery   GIVENS CAPSULE STUDY N/A 11/04/2015   Procedure: GIVENS CAPSULE STUDY;  Surgeon: Claudis PENNER Golda, MD;  [facility];  Service: Endoscopy;  Laterality: N/A;   Hysterscopy     KNEE ARTHROSCOPY     Left   LEFT HEART CATH AND CORONARY ANGIOGRAPHY N/A 05/30/2024   Procedure: LEFT HEART CATH AND CORONARY ANGIOGRAPHY;  Surgeon: Mady Bruckner, MD;  Location: [facility] INVASIVE CV LAB;  Service: Cardiovascular;  Laterality: N/A;   NECK SURGERY  x 2    TONSILLECTOMY     TOTAL KNEE ARTHROPLASTY  07/11/2012   Procedure: TOTAL KNEE ARTHROPLASTY;  Surgeon: Toribio JULIANNA Chancy, MD;  Location: [facility] OR;  Service: Orthopedics;  Laterality: Left;  left total knee arthroplasty   TOTAL KNEE ARTHROPLASTY Right 02/06/2024   Procedure: TOTAL KNEE ARTHROPLASTY;  Surgeon: Josefina Chew, MD;  Location: [facility]S;  Service: Orthopedics;  Laterality: Right;   UPPER GASTROINTESTINAL ENDOSCOPY     YAG LASER APPLICATION Right 02/21/2017   Procedure: YAG LASER APPLICATION;  Surgeon: Oneil Platts, MD;  Location:  [facility];  Service: Ophthalmology;  Laterality: Right;    Family History  Problem Relation Age of Onset   Alzheimer's disease Mother    Kidney disease Mother    Kidney disease Father    Diabetes Maternal Grandmother    Diabetes Maternal Grandfather    Cancer Paternal Grandmother        breast, liver   Hypertension Son    Cancer Maternal Aunt        leukemia   Liver disease Maternal Uncle    Kidney disease Paternal Uncle        kidney removed   Colon cancer Neg Hx     Social:  reports that she has never smoked. She has never been exposed to tobacco smoke. She has never used smokeless tobacco. She reports that she does not drink alcohol and does not use drugs.  Allergies:  Allergies  Allergen Reactions   Cephalexin Other (See Comments)    Unknown    Nsaids Other (See Comments)    Caused Ulcers.   Darvocet [Propoxyphene N-Acetaminophen ] Nausea Only   Penicillins Rash    Immediate rash, facial/tongue/throat swelling, SOB or lightheadedness with hypotension    Percocet [Oxycodone -Acetaminophen ] Nausea Only    Medications: Current Outpatient Medications  Medication Instructions   acetaminophen  (TYLENOL ) 1,000 mg, Every 6 hours PRN   cholecalciferol  (VITAMIN D3) 1,000 Units, Daily   Cranberry-Vitamin C (AZO CRANBERRY URINARY TRACT PO) 2 tablets, Daily   cyanocobalamin (VITAMIN B12) 1,000 mcg, Daily   escitalopram  (LEXAPRO ) 20 mg, Daily   ferrous sulfate  325 mg, Oral, Daily with breakfast   furosemide  (LASIX ) 20 MG tablet TAKE 1 TABLET BY MOUTH TWICE DAILY.TAKE AN ADDITIONAL TABLET DAILY AS NEEDED FOR SHORTNESS OF BREATH   gabapentin  (NEURONTIN ) 300 mg, Daily at bedtime   KLOR-CON  M20 20 MEQ tablet 20 mEq, Oral, Daily   metFORMIN (GLUCOPHAGE) 500 mg, 2 times daily with meals   montelukast  (SINGULAIR ) 10 mg, Every morning   omeprazole  (PRILOSEC) 20 mg, 2 times daily before meals   Probiotic Product (PROBIOTIC DAILY PO) 1 capsule, Daily   rivaroxaban  (XARELTO ) 20 mg, Daily  with supper   Turmeric 450 mg, Daily   zolpidem  (AMBIEN ) 10 mg, At bedtime PRN    ROS - all of the below systems have been reviewed with the patient and positives are indicated with bold text General: chills, fever or night sweats Eyes: blurry vision or double vision ENT: epistaxis or sore throat Allergy/Immunology: itchy/watery eyes or nasal congestion Hematologic/Lymphatic: bleeding problems, blood clots or swollen lymph nodes Endocrine: temperature intolerance or unexpected weight changes Breast: new or changing breast lumps or nipple discharge Resp: cough, shortness of breath, or wheezing CV: chest pain or dyspnea on exertion GI: as per HPI GU: dysuria, trouble voiding, or hematuria MSK: joint pain or joint stiffness Neuro: TIA or stroke symptoms Derm: pruritus and skin lesion changes Psych: anxiety and depression  Objective   PE  Blood pressure (!) 155/69, pulse 87, temperature 99.2 F (37.3 C), temperature source Oral, resp. rate 18, height 5' 1 (1.549 m), weight 77.1 kg, SpO2 96%. Constitutional: NAD; conversant; no deformities Eyes: Moist conjunctiva; no lid lag; anicteric; PERRL Neck: Trachea midline; no thyromegaly Lungs: Normal respiratory effort; no tactile fremitus CV: RRR; no palpable thrills; no pitting edema GI: Abd soft, nontender; no palpable hepatosplenomegaly MSK: Normal range of motion of extremities; no clubbing/cyanosis Psychiatric: Appropriate affect; alert and oriented x3 Lymphatic: No palpable cervical or axillary lymphadenopathy  Results for orders placed or performed during the hospital encounter of 10/28/24 (from the past 24 hours)  Glucose, capillary     Status: Abnormal   Collection Time: 10/28/24 10:16 AM  Result Value Ref Range   Glucose-Capillary 114 (H) 70 - 99 mg/dL    Imaging Orders  No imaging studies ordered today     Assessment and Plan   [name] is an 78 y.o. female with a massive hiatal hernia with history of  Cameron's erosions causing significant GI bleed, dysphagia and heartburn.  I recommended robotic hiatal hernia repair.  We discussed possible use of mesh.  We discussed possible fundoplication.  We discussed the surgery itself, as well as its risks, benefits and alternatives and the patient granted consent to proceed.  We will proceed as scheduled.  Deward JINNY Foy, MD  [facility] Use [email or web address] to contact on call provider

## 2024-10-28 NOTE — Transfer of Care (Signed)
 Immediate Anesthesia Transfer of Care Note  Patient: [name]  Procedure(s) Performed: REPAIR, HERNIA, HIATAL, ROBOT-ASSISTED  Patient Location: PACU  Anesthesia Type:General  Level of Consciousness: drowsy  Airway & Oxygen Therapy: Patient Spontanous Breathing and Patient connected to face mask oxygen  Post-op Assessment: Report given to RN and Post -op Vital signs reviewed and stable  Post vital signs: Reviewed and stable  Last Vitals:  Vitals Value Taken Time  BP 143/72 10/28/24 15:13  Temp    Pulse 110 10/28/24 15:15  Resp 12 10/28/24 15:15  SpO2 92 % 10/28/24 15:15  Vitals shown include unfiled device data.  Last Pain:  Vitals:   10/28/24 1012  TempSrc: Oral  PainSc: 0-No pain         Complications: No notable events documented.

## 2024-10-28 NOTE — Anesthesia Procedure Notes (Signed)
 Procedure Name: Intubation Date/Time: 10/28/2024 12:51 PM  Performed by: Lanning Cena RAMAN, CRNAPre-anesthesia Checklist: Emergency Drugs available, Patient identified, Suction available, Patient being monitored and Timeout performed Patient Re-evaluated:Patient Re-evaluated prior to induction Oxygen Delivery Method: Circle system utilized Preoxygenation: Pre-oxygenation with 100% oxygen Induction Type: IV induction Ventilation: Mask ventilation without difficulty Laryngoscope Size: Glidescope and 3 Grade View: Grade I Tube type: Oral Tube size: 7.0 mm Number of attempts: 1 Airway Equipment and Method: Stylet Placement Confirmation: ETT inserted through vocal cords under direct vision, breath sounds checked- equal and bilateral, CO2 detector and positive ETCO2 Secured at: 20 cm Tube secured with: Tape Dental Injury: Teeth and Oropharynx as per pre-operative assessment

## 2024-10-29 ENCOUNTER — Encounter (HOSPITAL_COMMUNITY): Payer: Self-pay | Admitting: Surgery

## 2024-10-29 DIAGNOSIS — K219 Gastro-esophageal reflux disease without esophagitis: Secondary | ICD-10-CM | POA: Diagnosis not present

## 2024-10-29 LAB — CBC
HCT: 38.9 % (ref 36.0–46.0)
Hemoglobin: 11.8 g/dL — ABNORMAL LOW (ref 12.0–15.0)
MCH: 27.2 pg (ref 26.0–34.0)
MCHC: 30.3 g/dL (ref 30.0–36.0)
MCV: 89.6 fL (ref 80.0–100.0)
Platelets: 245 K/uL (ref 150–400)
RBC: 4.34 MIL/uL (ref 3.87–5.11)
RDW: 17 % — ABNORMAL HIGH (ref 11.5–15.5)
WBC: 11.1 K/uL — ABNORMAL HIGH (ref 4.0–10.5)
nRBC: 0 % (ref 0.0–0.2)

## 2024-10-29 LAB — GLUCOSE, CAPILLARY
Glucose-Capillary: 105 mg/dL — ABNORMAL HIGH (ref 70–99)
Glucose-Capillary: 194 mg/dL — ABNORMAL HIGH (ref 70–99)

## 2024-10-29 LAB — BASIC METABOLIC PANEL WITH GFR
Anion gap: 7 (ref 5–15)
BUN: 21 mg/dL (ref 8–23)
CO2: 29 mmol/L (ref 22–32)
Calcium: 9.6 mg/dL (ref 8.9–10.3)
Chloride: 104 mmol/L (ref 98–111)
Creatinine, Ser: 0.72 mg/dL (ref 0.44–1.00)
GFR, Estimated: >60 mL/min (ref >60)
Glucose, Bld: 114 mg/dL — ABNORMAL HIGH (ref 70–99)
Potassium: 4.6 mmol/L (ref 3.5–5.1)
Sodium: 139 mmol/L (ref 135–145)

## 2024-10-29 MED ORDER — TRAMADOL HCL 50 MG PO TABS: 50.0000 mg | ORAL_TABLET | Freq: Four times a day (QID) | ORAL | 0 refills | Status: DC | PRN

## 2024-10-29 NOTE — Discharge Summary (Signed)
 Patient ID: [name] [id number] 78 y.o. 08-17-1946  10/28/2024  Discharge date and time: 10/29/2024  Admitting Physician: Deward PARAS Solene Hereford  Discharge Physician: Deward PARAS Areyanna Figeroa  Admission Diagnoses: Hiatal hernia [K44.9] Patient Active Problem List   Diagnosis Date Noted   Hiatal hernia 10/28/2024   Rectal bleeding 08/01/2024   Melena 08/01/2024   Gastritis and gastroduodenitis 08/01/2024   Gastric polyp 08/01/2024   Angiodysplasia of stomach 08/01/2024   Cameron lesion, acute 08/01/2024   Diverticulosis of colon without hemorrhage 08/01/2024   Acute GI bleeding 07/30/2024   Type 2 diabetes mellitus with hyperglycemia (HCC) 05/28/2024   Persistent atrial fibrillation (HCC) 05/28/2024   Obesity, Class I, BMI 30-34.9 05/28/2024   Chronic diastolic heart failure (HCC) 03/07/2024   PAF (paroxysmal atrial fibrillation) (HCC) 03/07/2024   Primary localized osteoarthritis of right knee 02/06/2024   S/P TKR (total knee replacement), right 02/06/2024   (HFpEF) heart failure with preserved ejection fraction (HCC) 07/04/2023   Paroxysmal atrial flutter (HCC) 07/04/2023   OSA (obstructive sleep apnea) 07/04/2023   DOE (dyspnea on exertion) 05/03/2023   Leg swelling 05/03/2023   History of pulmonary embolism 05/03/2023   Anxiety 12/29/2022   Kidney stones 12/29/2022   Ureteral colic 12/29/2022   Flank pain 12/28/2022   History of iron deficiency anemia 02/22/2022   IBS (irritable colon syndrome) 03/23/2021   Lower abdominal pain 03/23/2021   Well woman exam with routine gynecological exam 11/22/2017   Controlled type 2 diabetes mellitus (HCC) 11/02/2017   Nonspecific chest pain 11/02/2017   Bilateral leg edema    Diabetes (HCC) 03/28/2016   Pain in the chest    GERD (gastroesophageal reflux disease)    Chest pain 01/05/2015   Neuropathy 01/05/2015   Bronchitis    Guaiac positive hemoccult card on annual exam 05/08/2014   Chest pain at rest 11/14/2013   Left  knee pain 09/18/2013   Salmonella 05/24/2013   Thrombocytopenia, unspecified 05/24/2013   Acute gastroenteritis 05/21/2013   Fever 05/21/2013   Abnormal urinalysis 05/21/2013   Hypokalemia 05/21/2013   Dehydration 05/21/2013   Coccydynia 05/07/2013   Anemia 08/06/2012   PUD (peptic ulcer disease) 02/06/2012   Overweight 12/21/2010   Essential hypertension 12/29/2009   Asthma 12/29/2009   GERD 12/29/2009   Large hiatal hernia 12/29/2009   HEART MURMUR, SYSTOLIC 12/29/2009     Discharge Diagnoses:  Patient Active Problem List   Diagnosis Date Noted   Hiatal hernia 10/28/2024   Rectal bleeding 08/01/2024   Melena 08/01/2024   Gastritis and gastroduodenitis 08/01/2024   Gastric polyp 08/01/2024   Angiodysplasia of stomach 08/01/2024   Cameron lesion, acute 08/01/2024   Diverticulosis of colon without hemorrhage 08/01/2024   Acute GI bleeding 07/30/2024   Type 2 diabetes mellitus with hyperglycemia (HCC) 05/28/2024   Persistent atrial fibrillation (HCC) 05/28/2024   Obesity, Class I, BMI 30-34.9 05/28/2024   Chronic diastolic heart failure (HCC) 03/07/2024   PAF (paroxysmal atrial fibrillation) (HCC) 03/07/2024   Primary localized osteoarthritis of right knee 02/06/2024   S/P TKR (total knee replacement), right 02/06/2024   (HFpEF) heart failure with preserved ejection fraction (HCC) 07/04/2023   Paroxysmal atrial flutter (HCC) 07/04/2023   OSA (obstructive sleep apnea) 07/04/2023   DOE (dyspnea on exertion) 05/03/2023   Leg swelling 05/03/2023   History of pulmonary embolism 05/03/2023   Anxiety 12/29/2022   Kidney stones 12/29/2022   Ureteral colic 12/29/2022   Flank pain 12/28/2022   History of iron deficiency anemia 02/22/2022   IBS (  irritable colon syndrome) 03/23/2021   Lower abdominal pain 03/23/2021   Well woman exam with routine gynecological exam 11/22/2017   Controlled type 2 diabetes mellitus (HCC) 11/02/2017   Nonspecific chest pain 11/02/2017    Bilateral leg edema    Diabetes (HCC) 03/28/2016   Pain in the chest    GERD (gastroesophageal reflux disease)    Chest pain 01/05/2015   Neuropathy 01/05/2015   Bronchitis    Guaiac positive hemoccult card on annual exam 05/08/2014   Chest pain at rest 11/14/2013   Left knee pain 09/18/2013   Salmonella 05/24/2013   Thrombocytopenia, unspecified 05/24/2013   Acute gastroenteritis 05/21/2013   Fever 05/21/2013   Abnormal urinalysis 05/21/2013   Hypokalemia 05/21/2013   Dehydration 05/21/2013   Coccydynia 05/07/2013   Anemia 08/06/2012   PUD (peptic ulcer disease) 02/06/2012   Overweight 12/21/2010   Essential hypertension 12/29/2009   Asthma 12/29/2009   GERD 12/29/2009   Large hiatal hernia 12/29/2009   HEART MURMUR, SYSTOLIC 12/29/2009    Operations: Procedure(s): REPAIR, HERNIA, HIATAL, ROBOT-ASSISTED  Admission Condition: good  Discharged Condition: good  Indication for Admission: Hiatal hernia  Hospital Course: Robotic hiatal hernia repair  Consults: None  Significant Diagnostic Studies: None  Treatments: surgery: as above  Disposition: Home  Patient Instructions:  Allergies as of 10/29/2024       Reactions   Cephalexin Other (See Comments)   Unknown    Nsaids Other (See Comments)   Caused Ulcers.   Darvocet [propoxyphene N-acetaminophen ] Nausea Only   Penicillins Rash   Immediate rash, facial/tongue/throat swelling, SOB or lightheadedness with hypotension   Percocet [oxycodone -acetaminophen ] Nausea Only        Medication List     TAKE these medications    acetaminophen  500 MG tablet Commonly known as: TYLENOL  Take 1,000 mg by mouth every 6 (six) hours as needed for mild pain (pain score 1-3) or headache.   AZO CRANBERRY URINARY TRACT PO Take 2 tablets by mouth daily.   cholecalciferol  25 MCG (1000 UNIT) tablet Commonly known as: VITAMIN D3 Take 1,000 Units by mouth daily.   cyanocobalamin 1000 MCG tablet Commonly known as: VITAMIN  B12 Take 1,000 mcg by mouth daily.   escitalopram  20 MG tablet Commonly known as: LEXAPRO  Take 20 mg by mouth daily.   ferrous sulfate  325 (65 FE) MG tablet Take 1 tablet (325 mg total) by mouth daily with breakfast. What changed: when to take this   furosemide  20 MG tablet Commonly known as: LASIX  TAKE 1 TABLET BY MOUTH TWICE DAILY.TAKE AN ADDITIONAL TABLET DAILY AS NEEDED FOR SHORTNESS OF BREATH   gabapentin  300 MG capsule Commonly known as: NEURONTIN  Take 300 mg by mouth at bedtime.   Klor-Con  M20 20 MEQ tablet Generic drug: potassium chloride  SA TAKE 1 TABLET BY MOUTH EVERY DAY   metFORMIN 500 MG tablet Commonly known as: GLUCOPHAGE Take 500 mg by mouth 2 (two) times daily with a meal.   montelukast  10 MG tablet Commonly known as: SINGULAIR  Take 10 mg by mouth every morning.   omeprazole  20 MG capsule Commonly known as: PRILOSEC Take 20 mg by mouth 2 (two) times daily before a meal.   PROBIOTIC DAILY PO Take 1 capsule by mouth daily.   rivaroxaban  20 MG Tabs tablet Commonly known as: XARELTO  Take 20 mg by mouth daily with supper.   traMADol  50 MG tablet Commonly known as: Ultram  Take 1 tablet (50 mg total) by mouth every 6 (six) hours as needed for severe pain (  pain score 7-10).   Turmeric 450 MG Caps Take 450 mg by mouth daily.   zolpidem  10 MG tablet Commonly known as: AMBIEN  Take 10 mg by mouth at bedtime as needed for sleep.        Activity: no heavy lifting for 4 weeks Diet: Full liquid diet - slowly advance food consistency Wound Care: keep wound clean and dry  Follow-up:  With Dr. Lyndel in 4 weeks.  Signed: Deward PARAS Brenly [facility] [place]   10/29/2024, 7:49 AM

## 2024-10-29 NOTE — Plan of Care (Signed)
   Problem: Education: Goal: Knowledge of General Education information will improve Description Including pain rating scale, medication(s)/side effects and non-pharmacologic comfort measures Outcome: Progressing   Problem: Health Behavior/Discharge Planning: Goal: Ability to manage health-related needs will improve Outcome: Progressing

## 2024-10-29 NOTE — Plan of Care (Signed)

## 2024-10-29 NOTE — Progress Notes (Signed)
 All questions and concerns answered prior to discharge. Patient left hospital via front entrance with nurse tech, Si

## 2024-10-29 NOTE — Progress Notes (Signed)
 Nutrition Education Note  RD consulted for nutrition education regarding full liquid diet. Pt is s/p hiatal hernia repair 11/3. Surgery would like pt to follow a full liquid diet with protein supplementation and advance diet as tolerated.  RD provided Full liquid Nutrition Therapy handout from the [employer]. Reviewed patient's dietary recall. Provided examples of full liquid options. Discussed foods to try when ready to advance like soft eggs, mashed potatoes or soft cooked vegetables and fruits. Recommended protein shakes and provided options.   RD discussed why it is important for patient to adhere to diet recommendations. Teach back method used.  Expect good compliance.  Body mass index is 32.12 kg/m. Pt meets criteria for obesity based on current BMI.  Current diet order is full liquids, patient is consuming approximately 100% of meals at this time. Labs and medications reviewed. No further nutrition interventions warranted at this time.  If additional nutrition issues arise, please re-consult RD.   Morna Lee, MS, RD, LDN Inpatient Clinical Dietitian Contact via Secure chat

## 2024-10-29 NOTE — Discharge Instructions (Signed)
HIATAL HERNIA REPAIR POST OPERATIVE INSTRUCTIONS  Thinking Clearly  The anesthesia may cause you to feel different for 1 or 2 days. Do not drive, drink alcohol, or make any big decisions for at least 2 days.  Nutrition Take small bites, chew food well, eat slowly. Avoid carbonated beverages, avoid chewing gum, these can cause you to swallow air and cause discomfort as you may not belch as well after hiatal hernia repair. Slowly advance the consistency of your diet to solid food.   Avoid foods that were problematic to swallow before surgery. Try to take in about 70 grams of protein per day - use protein shakes if needed to meet this goal until you're taking a more normal diet. Activity Slowly increase your activity. Be sure to get up and walk every hour or so to prevent blood clots. No heavy lifting or strenuous activity for 4 weeks following surgery to prevent hernias at your incision sites or recurrence of your hernia. It is normal to feel tired. You may need more sleep than usual.  Get your rest but make sure to get up and move around frequently to prevent blood clots and pneumonia.  Work and Return to [employer] can go back to work when you feel well enough. Discuss the timing with your surgeon. If your work requires heavy lifting or strenuous activity you need to be placed on light duty for 4 weeks following surgery. You can return to gym class, sports or other physical activities 4 weeks after surgery.  Wound Care You may experience significant bruising. Do not soak in a bathtub until cleared at your follow up appointment. You may take a shower 24 hours after surgery. If you have dermabond glue covering over the incision, allow the glue to flake off on its own. Protect the new skin, especially from the sun. The sun can burn and cause darker scarring. Your scar will heal in about 4 to 6 weeks and will become softer and continue to fade over the next year.  The cosmetic appearance of  the incisions will improve over the course of the first year after surgery. Sensation around your incision will return in a few weeks or months.  Bowel Movements After intestinal surgery, you may have loose watery stools for several days. If watery diarrhea lasts longer than 3 days, contact your surgeon. Pain medication (narcotics) can cause constipation. Increase the fiber in your diet with high-fiber foods if you are constipated. You can take an over the counter stool softener like Colace to avoid constipation.  Additional over the counter medications can also be used if Colace isn't sufficient (for example, Milk of Magnesia or Miralax).  Pain The amount of pain is different for each person. Some people need only 1 to 3 doses of pain control medication, while others need more. Take alternating doses of tylenol and ibuprofen around the clock for the first five days following surgery.  This will provide a baseline of pain control and help with inflammation.  Take the narcotic pain medication in addition if needed for severe pain.  Contact Your Surgeon at [phone], if you have: Pain that will not go away Pain that gets worse A fever of more than 101F (38.3C) Repeated vomiting Swelling, redness, bleeding, or bad-smelling drainage from your wound site Strong abdominal pain No bowel movement or unable to pass gas for 3 days Watery diarrhea lasting longer than 3 days  Pain Control The goal of pain control is to minimize pain, keep  you moving and help you heal. Your surgical team will work with you on your pain plan. Most often a combination of therapies and medications are used to control your pain. You may also be given medication (local anesthetic) at the surgical site. This may help control your pain for several days. Extreme pain puts extra stress on your body at a time when your body needs to focus on healing. Do not wait until your pain has reached a level "10" or is unbearable before  telling your doctor or nurse. It is much easier to control pain before it becomes severe. Following a laparoscopic procedure, pain is sometimes felt in the shoulder. This is due to the gas inserted into your abdomen during the procedure. Moving and walking helps to decrease the gas and the right shoulder pain.  Use the guide below for ways to manage your post-operative pain. Learn more by going to facs.org/safepaincontrol.  How Intense Is My Pain Common Therapies to Feel Better       I hardly notice my pain, and it does not interfere with my activities.  I notice my pain and it distracts me, but I can still do activities (sitting up, walking, standing).  Non-Medication Therapies  Ice (in a bag, applied over clothing at the surgical site), elevation, rest, meditation, massage, distraction (music, TV, play) walking and mild exercise Splinting the abdomen with pillows +  Non-Opioid Medications Acetaminophen (Tylenol) Non-steroidal anti-inflammatory drugs (NSAIDS) Aspirin, Ibuprofen (Motrin, Advil) Naproxen (Aleve) Take these as needed, when you feel pain. Both acetaminophen and NSAIDs help to decrease pain and swelling (inflammation).      My pain is hard to ignore and is more noticeable even when I rest.  My pain interferes with my usual activities.  Non-Medication Therapies  +  Non-Opioid medications  Take on a regular schedule (around-the-clock) instead of as needed. (For example, Tylenol every 6 hours at 9:00 am, 3:00 pm, 9:00 pm, 3:00 am and Motrin every 6 hours at 12:00 am, 6:00 am, 12:00 pm, 6:00 pm)         I am focused on my pain, and I am not doing my daily activities.  I am groaning in pain, and I cannot sleep. I am unable to do anything.  My pain is as bad as it could be, and nothing else matters.  Non-Medication Therapies  +  Around-the-Clock Non-Opioid Medications  +  Short-acting opioids  Opioids should be used with other medications to  manage severe pain. Opioids block pain and give a feeling of euphoria (feel high). Addiction, a serious side effect of opioids, is rare with short-term (a few days) use.  Examples of short-acting opioids include: Tramadol (Ultram), Hydrocodone (Norco, Vicodin), Hydromorphone (Dilaudid), Oxycodone (Oxycontin)     The above directions have been adapted from the [employer] of [employer] Surgical Patient Education Program.  Please refer to the ACS website if needed: http://chapman.info/.ashx   Ivar Drape, MD [facility] [address], [address], [place], [place]  [address] ?  [address] [address], [place], [place]   [address] [phone] ? [phone] ? FAX [phone] Web site: [email or web address]

## 2024-11-05 ENCOUNTER — Encounter (INDEPENDENT_AMBULATORY_CARE_PROVIDER_SITE_OTHER): Payer: Self-pay | Admitting: Gastroenterology

## 2024-12-25 ENCOUNTER — Other Ambulatory Visit: Payer: Self-pay | Admitting: Internal Medicine

## 2024-12-30 ENCOUNTER — Ambulatory Visit (INDEPENDENT_AMBULATORY_CARE_PROVIDER_SITE_OTHER): Admitting: Gastroenterology

## 2024-12-31 ENCOUNTER — Encounter (INDEPENDENT_AMBULATORY_CARE_PROVIDER_SITE_OTHER): Payer: Self-pay | Admitting: Gastroenterology

## 2024-12-31 ENCOUNTER — Ambulatory Visit (INDEPENDENT_AMBULATORY_CARE_PROVIDER_SITE_OTHER): Admitting: Gastroenterology

## 2024-12-31 VITALS — BP 119/68 | HR 90 | Temp 98.7°F | Ht 61.0 in | Wt 168.7 lb

## 2024-12-31 DIAGNOSIS — D649 Anemia, unspecified: Secondary | ICD-10-CM

## 2024-12-31 DIAGNOSIS — D509 Iron deficiency anemia, unspecified: Secondary | ICD-10-CM | POA: Diagnosis not present

## 2024-12-31 DIAGNOSIS — Z8711 Personal history of peptic ulcer disease: Secondary | ICD-10-CM | POA: Diagnosis not present

## 2024-12-31 DIAGNOSIS — R197 Diarrhea, unspecified: Secondary | ICD-10-CM

## 2024-12-31 MED ORDER — OMEPRAZOLE 20 MG PO CPDR: 20.0000 mg | DELAYED_RELEASE_CAPSULE | Freq: Two times a day (BID) | ORAL | 1 refills | Status: AC

## 2024-12-31 NOTE — Progress Notes (Unsigned)
 "  Referring Provider: Marvine Rush, MD Primary Care Physician:  Marvine Rush, MD Primary GI Physician: Previously Dr. Eartha (Dr. Cinderella)  Chief Complaint  Patient presents with   Follow-up    Patient here today for a follow up anemia. Patient has had some issues with shortness of breath, fatigue and dizziness. Denies any chest pain, and has notice feeling of her heart racing. He had a hiatal hernia repair on October 28, 2024 preformed by Dr. Lyndel. Hgb on 10/29/2024 was 11.8 and is due for more labs to be drawn by hematologist soon. Takes 650 mg of Ferrous sulfate  daily.    HPI:   [name] is a 79 y.o. female with past medical history of anxiety,arthritis, asthma, depression, gastric ulcer, GERD, HTN, IDA, neuropathy, Type 2 DM   Patient presenting today for:  Follow up of anemia thought secondary to cameron ulcers/large HH  Diarrhea   Last seen September, at that time having some dysphagia, food sitting in mid chest, some upper abdominal tenderness. Taking omeprazole  20mg  BID.  Recommended continue daily iron pill, iron studies, referral to surgery for [facility] repair, continue PPI BID, smaller meals throughout the day  Last iron studies in October with Iron 54 TIBC 417 sat 13 ferritin 23  HH repair done by Dr. Lyndel on 10/28/24  Hgb was 11.8 on 11/4  Present:  States she had surgery in November which she did well with. States she has chronic diarrhea which she has had for years, having watery stools since prior to her surgery but notes usually only has a BM every other day. She has some lower abdominal pain. No rectal bleeding or melena. Engineer, civil (consulting) recommended she start metamucil which she started and feels this has helped some. She does not feel that abdominal discomfort is not improved with defecation, she notes onset of lower abdominal discomfort after her surgery and less solid stools since then as well. She notes since her surgery she cannot drink carbonated  drinks as this gives her a lot of gas and bloating. She did see hematology and was advised to increase iron to BID, she is taking two tablets daily at lunchtime. She denies any  nausea, vomiting or dysphagia with foods. Some larger pills dont go down as easily but otherwise no issues with swallowing. She endorses some ongoing SOB and dizziness, but states this has been ongoing for a while, usually occurs more with standing up too quickly.   Colonoscopy:07/2024  Diverticulosis in the left colon.                           - Non-bleeding internal hemorrhoids.                           - No specimens collected. Endoscopy: 07/2024 Z-line regular.                           - 10 cm hiatal hernia with a single Cameron ulcer.                           - Multiple gastric polyps. Resected and retrieved.                            representative polyp removed only                           -  Erythematous mucosa in the antrum. Biopsied.                           - A single non-bleeding angioectasia in the                            duodenum. Treated with argon plasma coagulation                            (APC).                           -Bleeding cameron lesion s/p APC .                           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 Likely cause of anemia is cameron lesions in                            setting of anticoagulation and antiplatelet use   Filed Weights   12/31/24 1459  Weight: 168 lb 11.2 oz (76.5 kg)     Past Medical History:  Diagnosis Date   Anxiety 12/29/2022   Arthritis    Asthma    Carpal tunnel syndrome    Depression    Dyspnea    Dysrhythmia    Gastric ulcer    GERD (gastroesophageal reflux disease)    H/O hiatal hernia    History of heart murmur in childhood    History of kidney stones    Hypertension    pt denies   Iron deficiency anemia     Neuropathy    Following neck surgery.   Paralysis (HCC)    PONV (postoperative nausea and vomiting)    in past- pt states caused by Percocet reaction   Primary localized osteoarthritis of right knee 02/06/2024   Pulmonary embolus [facility])    November 2020   Type 2 diabetes mellitus [facility])     Past Surgical History:  Procedure Laterality Date   APPENDECTOMY     ATRIAL FIBRILLATION ABLATION N/A 10/09/2023   Procedure: ATRIAL FIBRILLATION ABLATION;  Surgeon: Kennyth Chew, MD;  Location: [facility] INVASIVE CV LAB;  Service: Cardiovascular;  Laterality: N/A;   BIOPSY  06/01/2022   Procedure: BIOPSY;  Surgeon: Golda Claudis PENNER, MD;  [facility];  Service: Endoscopy;;   CHOLECYSTECTOMY     COLONOSCOPY     COLONOSCOPY N/A 09/26/2013   Procedure: COLONOSCOPY;  Surgeon: Claudis PENNER Golda, MD;  [facility]: [facility];  Service: Endoscopy;  Laterality: N/A;  1030-rescheduled to 12:00pm Ann notified pt   COLONOSCOPY N/A 03/27/2015   Procedure: COLONOSCOPY;  Surgeon: Claudis PENNER Golda, MD;  [facility];  Service: Endoscopy;  Laterality: N/A;  11:15 - moved to 8:25 - Ann to notify pt   COLONOSCOPY N/A 08/01/2024   Procedure: COLONOSCOPY;  Surgeon: Cinderella Deatrice FALCON, MD;  [facility]  [facility];  Service: Endoscopy;  Laterality: N/A;   COLONOSCOPY WITH PROPOFOL  N/A 06/01/2022   Procedure: COLONOSCOPY WITH PROPOFOL ;  Surgeon: Golda Claudis PENNER, MD;  [facility];  Service: Endoscopy;  Laterality: N/A;  1005 ASA 2   CYSTOSCOPY/URETEROSCOPY/HOLMIUM LASER/STENT PLACEMENT Left 12/23/2022   Procedure: CYSTOSCOPY/ LEFT RETROGRADE/ URETEROSCOPY/HOLMIUM LASER/STENT PLACEMENT;  Surgeon: Nieves Cough, MD;  Location: [facility] ORS;  Service: Urology;  Laterality: Left;   ESOPHAGOGASTRODUODENOSCOPY  12/08/2011   Procedure: ESOPHAGOGASTRODUODENOSCOPY (EGD);  Surgeon: Claudis PENNER Golda, MD;  [facility];  Service: Endoscopy;  Laterality: N/A;  3:00    ESOPHAGOGASTRODUODENOSCOPY N/A 03/27/2015    Procedure: ESOPHAGOGASTRODUODENOSCOPY (EGD);  Surgeon: Claudis PENNER Golda, MD;  [facility];  Service: Endoscopy;  Laterality: N/A;   ESOPHAGOGASTRODUODENOSCOPY N/A 08/01/2024   Procedure: EGD (ESOPHAGOGASTRODUODENOSCOPY);  Surgeon: Cinderella Deatrice FALCON, MD;  [facility];  Service: Endoscopy;  Laterality: N/A;   EYE SURGERY Bilateral    cataract surgery   GIVENS CAPSULE STUDY N/A 11/04/2015   Procedure: GIVENS CAPSULE STUDY;  Surgeon: Claudis PENNER Golda, MD;  [facility];  Service: Endoscopy;  Laterality: N/A;   Hysterscopy     KNEE ARTHROSCOPY     Left   LEFT HEART CATH AND CORONARY ANGIOGRAPHY N/A 05/30/2024   Procedure: LEFT HEART CATH AND CORONARY ANGIOGRAPHY;  Surgeon: Mady Bruckner, MD;  Location: [facility] INVASIVE CV LAB;  Service: Cardiovascular;  Laterality: N/A;   NECK SURGERY     x 2    TONSILLECTOMY     TOTAL KNEE ARTHROPLASTY  07/11/2012   Procedure: TOTAL KNEE ARTHROPLASTY;  Surgeon: Toribio FALCON Chancy, MD;  Location: [facility] OR;  Service: Orthopedics;  Laterality: Left;  left total knee arthroplasty   TOTAL KNEE ARTHROPLASTY Right 02/06/2024   Procedure: TOTAL KNEE ARTHROPLASTY;  Surgeon: Josefina Chew, MD;  Location: [facility];  Service: Orthopedics;  Laterality: Right;   UPPER GASTROINTESTINAL ENDOSCOPY     XI ROBOTIC ASSISTED HIATAL HERNIA REPAIR N/A 10/28/2024   Procedure: REPAIR, HERNIA, HIATAL, ROBOT-ASSISTED;  Surgeon: Lyndel Deward PARAS, MD;  Location: [facility];  Service: General;  Laterality: N/A;  ROBOTIC HIATAL HERNIA REPAIR WITH ABSORBABLE MESH AND POSSIBLE FUNDOPLICATION   YAG LASER APPLICATION Right 02/21/2017   Procedure: YAG LASER APPLICATION;  Surgeon: Oneil Platts, MD;  Location: [facility];  Service: Ophthalmology;  Laterality: Right;    Current Outpatient Medications  Medication Sig Dispense Refill   acetaminophen  (TYLENOL ) 500 MG tablet Take 1,000 mg by mouth every 6 (six) hours as needed for mild pain (pain score 1-3) or headache.     cholecalciferol   (VITAMIN D3) 25 MCG (1000 UNIT) tablet Take 1,000 Units by mouth daily.     Cranberry-Vitamin C (AZO CRANBERRY URINARY TRACT PO) Take 2 tablets by mouth daily.     cyanocobalamin (VITAMIN B12) 1000 MCG tablet Take 1,000 mcg by mouth daily.     escitalopram  (LEXAPRO ) 20 MG tablet Take 20 mg by mouth daily.     ferrous sulfate  325 (65 FE) MG tablet Take 1 tablet (325 mg total) by mouth daily with breakfast. (Patient taking differently: Take 650 mg by mouth daily at 6 (six) AM.)     furosemide  (LASIX ) 20 MG tablet TAKE 1 TABLET BY MOUTH TWICE DAILY.TAKE AN ADDITIONAL TABLET DAILY AS NEEDED FOR SHORTNESS OF BREATH (Patient taking differently: Take 20 mg by mouth 2 (two) times daily.) 180 tablet 1   gabapentin  (NEURONTIN ) 300 MG capsule Take 300 mg by mouth at bedtime.  metFORMIN (GLUCOPHAGE) 500 MG tablet Take 500 mg by mouth 2 (two) times daily with a meal.      montelukast  (SINGULAIR ) 10 MG tablet Take 10 mg by mouth every morning.     omeprazole  (PRILOSEC) 20 MG capsule Take 20 mg by mouth 2 (two) times daily before a meal.     potassium chloride  SA (KLOR-CON  M) 20 MEQ tablet TAKE 1 TABLET BY MOUTH EVERY DAY 90 tablet 1   Probiotic Product (PROBIOTIC DAILY PO) Take 1 capsule by mouth daily.     rivaroxaban  (XARELTO ) 20 MG TABS tablet Take 20 mg by mouth daily with supper.     Turmeric 450 MG CAPS Take 450 mg by mouth daily.     zolpidem  (AMBIEN ) 10 MG tablet Take 10 mg by mouth at bedtime as needed for sleep.     No current facility-administered medications for this visit.    Allergies as of 12/31/2024 - Review Complete 12/31/2024  Allergen Reaction Noted   Cephalexin Other (See Comments) 12/08/2011   Nsaids Other (See Comments) 07/06/2012   Darvocet [propoxyphene n-acetaminophen ] Nausea Only 12/08/2011   Penicillins Rash 12/30/2009   Percocet [oxycodone -acetaminophen ] Nausea Only 10/27/2011    Social History   Socioeconomic History   Marital status: Married    Spouse name: Not on  file   Number of children: Not on file   Years of education: Not on file   Highest education level: Not on file  Occupational History   Not on file  Tobacco Use   Smoking status: Never    Passive exposure: Never   Smokeless tobacco: Never  Vaping Use   Vaping status: Never Used  Substance and Sexual Activity   Alcohol use: No    Alcohol/week: 0.0 standard drinks of alcohol   Drug use: No   Sexual activity: Not Currently    Birth control/protection: Post-menopausal  Other Topics Concern   Not on file  Social History Narrative   Not on file   Social Drivers of Health   Tobacco Use: Low Risk (12/31/2024)   Patient History    Smoking Tobacco Use: Never    Smokeless Tobacco Use: Never    Passive Exposure: Never  Financial Resource Strain: Not on file  Food Insecurity: No Food Insecurity (10/28/2024)   Epic    Worried About Programme Researcher, Broadcasting/film/video in the Last Year: Never true    [employer] of Food in the Last Year: Never true  Transportation Needs: No Transportation Needs (10/28/2024)   Epic    Lack of Transportation (Medical): No    Lack of Transportation (Non-Medical): No  Physical Activity: Not on file  Stress: Not on file  Social Connections: Socially Integrated (10/28/2024)   Social Connection and Isolation Panel    Frequency of Communication with Friends and Family: More than three times a week    Frequency of Social Gatherings with Friends and Family: More than three times a week    Attends Religious Services: 1 to 4 times per year    Active Member of Clubs or Organizations: Yes    Attends Banker Meetings: More than 4 times per year    Marital Status: Married  Depression (PHQ2-9): Low Risk (09/27/2024)   Depression (PHQ2-9)    PHQ-2 Score: 0  Alcohol Screen: Not on file  Housing: Low Risk (10/28/2024)   Epic    Unable to Pay for Housing in the Last Year: No    Number of Times Moved in the Last Year:  0    Homeless in the Last Year: No  Utilities: Not At Risk  (10/28/2024)   Epic    Threatened with loss of utilities: No  Health Literacy: Not on file    Review of systems General: negative for malaise, night sweats, fever, chills, weight loss Neck: Negative for lumps, goiter, pain and significant neck swelling Resp: Negative for cough, wheezing, dyspnea at rest +SOB CV: Negative for chest pain, leg swelling, palpitations, orthopnea GI: denies melena, hematochezia, nausea, vomiting, constipation, dysphagia, odyonophagia, early satiety or unintentional weight loss. +diarrhea  MSK: Negative for joint pain or swelling, back pain, and muscle pain. Derm: Negative for itching or rash Psych: Denies depression, anxiety, memory loss, confusion. No homicidal or suicidal ideation.  Heme: Negative for prolonged bleeding, bruising easily, and swollen nodes. Endocrine: Negative for cold or heat intolerance, polyuria, polydipsia and goiter. Neuro: negative for tremor, gait imbalance, syncope and seizures+dizziness with standing  The remainder of the review of systems is noncontributory.  Physical Exam: BP 119/68 (BP Location: Left Arm, Patient Position: Sitting, Cuff Size: Large)   Pulse 90   Temp 98.7 F (37.1 C) (Temporal)   Ht 5' 1 (1.549 m)   Wt 168 lb 11.2 oz (76.5 kg)   BMI 31.88 kg/m  General:   Alert and oriented. No distress noted. Pleasant and cooperative.  Head:  Normocephalic and atraumatic. Eyes:  Conjuctiva clear without scleral icterus. Mouth:  Oral mucosa pink and moist. Good dentition. No lesions. Heart: Normal rate and rhythm, s1 and s2 heart sounds present.  Lungs: Clear lung sounds in all lobes. Respirations equal and unlabored. Abdomen:  +BS, soft, non-tender and non-distended. No rebound or guarding. No HSM or masses noted. Derm: No palmar erythema or jaundice Msk:  Symmetrical without gross deformities. Normal posture. Extremities:  Without edema. Neurologic:  Alert and  oriented x4 Psych:  Alert and cooperative. Normal mood  and affect.  Invalid input(s): 6 MONTHS   ASSESSMENT: [name] is a 79 y.o. female presenting today for follow up of anemia secondary to cameron ulcers/large hh and diarrhea  Diarrhea: history of IBS, though more diarrhea after recent [facility] repair. surgeon thought that diarrhea may be due to increase GI motility after [facility] repair. Some improvement with metamucil. She notes if she takes 3 metamucil per day she does not go to the restroom at all. Query if this is overflow diarrhea, low suspicion for infectious etiology given chronicity, no recent antibiotic exposure, low suspicion for C diff. For now, I recommend she continue with metamucil BID, increase water  intake, if diarrhea persists over the next week, we will pursue stool studies and/or abdominal xray to further evaluate for overflow diarrhea in setting of constipation.  Anemia: recent hospital admission in August for anemia, rectal bleeding and melena, Underwent EGD and Colonoscopy with findings of cameron ulcers secondary to large, 10cm hiatal hernia. Anemia though to be secondary to cameron ulcers. She underwent HH repair in November, hgb at that time was stable at 11.8. she is taking iron pill x2 per day but having some SOB and dizziness at times. Denies rectal bleeding or melena. Low suspicion that her SOB and dizziness are secondary to anemia though we will update her labs to evaluate for ongoing IDA.    PLAN:  -CBC, Iron studies -continue iron pill x2/day -continue metamucil BID for diarrhea -increase water  intake   -consider other testing for diarrhea persists beyond the next week -consider abdominal xray to evaluate for stool burden/overflow diarrhea -continue  omeprazole  20mg  BID   All questions were answered, patient verbalized understanding and is in agreement with plan as outlined above.   Follow Up: 3 months   Sue Mcalexander L. Janella Rogala, MSN, APRN, AGNP-C Adult-Gerontology Nurse Practitioner [facility]  "

## 2024-12-31 NOTE — Patient Instructions (Signed)
-  CBC, Iron studies -continue iron pill x2/day -continue metamucil twice daily for diarrhea -increase water  intake   -consider other testing for diarrhea if this worsens, please update me in about 1 week if this is still ongoing  -continue omeprazole  20mg  twice daily   Follow up 3 months  It was a pleasure to see you today. I want to create trusting relationships with patients and provide genuine, compassionate, and quality care. I truly value your feedback! please be on the lookout for a survey regarding your visit with me today. I appreciate your input about our visit and your time in completing this!    Jaquia Benedicto L. Sheniece Ruggles, MSN, APRN, AGNP-C Adult-Gerontology Nurse Practitioner [facility] at [facility]

## 2025-01-01 ENCOUNTER — Ambulatory Visit (INDEPENDENT_AMBULATORY_CARE_PROVIDER_SITE_OTHER): Payer: Self-pay | Admitting: Gastroenterology

## 2025-01-01 DIAGNOSIS — R197 Diarrhea, unspecified: Secondary | ICD-10-CM | POA: Insufficient documentation

## 2025-01-01 LAB — CBC
Hematocrit: 38.2 % (ref 34.0–46.6)
Hemoglobin: 12.3 g/dL (ref 11.1–15.9)
MCH: 29.3 pg (ref 26.6–33.0)
MCHC: 32.2 g/dL (ref 31.5–35.7)
MCV: 91 fL (ref 79–97)
Platelets: 278 x10E3/uL (ref 150–450)
RBC: 4.2 x10E6/uL (ref 3.77–5.28)
RDW: 16.7 % — ABNORMAL HIGH (ref 11.7–15.4)
WBC: 10.2 x10E3/uL (ref 3.4–10.8)

## 2025-01-01 LAB — IRON,TIBC AND FERRITIN PANEL
Ferritin: 37 ng/mL (ref 15–150)
Iron Saturation: 12 % — ABNORMAL LOW (ref 15–55)
Iron: 37 ug/dL (ref 27–139)
Total Iron Binding Capacity: 317 ug/dL (ref 250–450)
UIBC: 280 ug/dL (ref 118–369)

## 2025-01-17 ENCOUNTER — Other Ambulatory Visit (INDEPENDENT_AMBULATORY_CARE_PROVIDER_SITE_OTHER): Payer: Self-pay

## 2025-01-17 DIAGNOSIS — D649 Anemia, unspecified: Secondary | ICD-10-CM

## 2025-01-17 DIAGNOSIS — K921 Melena: Secondary | ICD-10-CM

## 2025-01-17 DIAGNOSIS — K625 Hemorrhage of anus and rectum: Secondary | ICD-10-CM

## 2025-04-08 ENCOUNTER — Ambulatory Visit: Admitting: Internal Medicine

## 2025-05-12 ENCOUNTER — Ambulatory Visit: Admitting: Urology
